# Patient Record
Sex: Male | Born: 1960
Health system: Southern US, Community
[De-identification: ages and names within clinical notes are randomized; demographics above are authoritative.]

## PROBLEM LIST (undated history)

## (undated) DIAGNOSIS — I10 Essential (primary) hypertension: Secondary | ICD-10-CM

## (undated) DIAGNOSIS — K219 Gastro-esophageal reflux disease without esophagitis: Secondary | ICD-10-CM

## (undated) DIAGNOSIS — L8990 Pressure ulcer of unspecified site, unspecified stage: Secondary | ICD-10-CM

## (undated) DIAGNOSIS — G35 Multiple sclerosis: Secondary | ICD-10-CM

## (undated) DIAGNOSIS — T7840XA Allergy, unspecified, initial encounter: Secondary | ICD-10-CM

## (undated) HISTORY — DX: Allergy, unspecified, initial encounter: T78.40XA

## (undated) HISTORY — DX: Gastro-esophageal reflux disease without esophagitis: K21.9

## (undated) HISTORY — PX: ROTATOR CUFF REPAIR: SHX139

## (undated) HISTORY — PX: OTHER SURGICAL HISTORY: SHX169

---

## 1998-11-01 ENCOUNTER — Ambulatory Visit (HOSPITAL_COMMUNITY): Admission: RE | Admit: 1998-11-01 | Discharge: 1998-11-01 | Payer: Self-pay | Admitting: Neurosurgery

## 1998-11-01 ENCOUNTER — Encounter: Payer: Self-pay | Admitting: Neurosurgery

## 2004-03-03 ENCOUNTER — Emergency Department: Payer: Self-pay | Admitting: Emergency Medicine

## 2004-03-18 ENCOUNTER — Other Ambulatory Visit: Payer: Self-pay

## 2004-03-18 ENCOUNTER — Emergency Department: Payer: Self-pay | Admitting: Emergency Medicine

## 2005-03-17 ENCOUNTER — Ambulatory Visit: Payer: Self-pay | Admitting: Family Medicine

## 2005-12-29 ENCOUNTER — Ambulatory Visit: Payer: Self-pay | Admitting: Psychiatry

## 2006-12-13 ENCOUNTER — Encounter: Payer: Self-pay | Admitting: Psychiatry

## 2006-12-25 ENCOUNTER — Encounter: Payer: Self-pay | Admitting: Psychiatry

## 2007-01-25 ENCOUNTER — Encounter: Payer: Self-pay | Admitting: Psychiatry

## 2007-06-25 ENCOUNTER — Encounter (HOSPITAL_COMMUNITY): Admission: RE | Admit: 2007-06-25 | Discharge: 2007-09-03 | Payer: Self-pay | Admitting: *Deleted

## 2007-08-29 ENCOUNTER — Ambulatory Visit: Payer: Self-pay | Admitting: Family Medicine

## 2007-08-29 DIAGNOSIS — F3289 Other specified depressive episodes: Secondary | ICD-10-CM | POA: Insufficient documentation

## 2007-08-29 DIAGNOSIS — F329 Major depressive disorder, single episode, unspecified: Secondary | ICD-10-CM

## 2007-08-29 DIAGNOSIS — G35 Multiple sclerosis: Secondary | ICD-10-CM | POA: Insufficient documentation

## 2007-08-29 DIAGNOSIS — K219 Gastro-esophageal reflux disease without esophagitis: Secondary | ICD-10-CM

## 2007-08-29 DIAGNOSIS — N529 Male erectile dysfunction, unspecified: Secondary | ICD-10-CM

## 2007-08-29 DIAGNOSIS — J309 Allergic rhinitis, unspecified: Secondary | ICD-10-CM | POA: Insufficient documentation

## 2007-08-29 DIAGNOSIS — G4700 Insomnia, unspecified: Secondary | ICD-10-CM

## 2007-12-19 ENCOUNTER — Encounter: Payer: Self-pay | Admitting: Family Medicine

## 2008-03-20 ENCOUNTER — Encounter: Payer: Self-pay | Admitting: Family Medicine

## 2008-04-29 ENCOUNTER — Ambulatory Visit: Payer: Self-pay | Admitting: Family Medicine

## 2008-04-29 DIAGNOSIS — K5900 Constipation, unspecified: Secondary | ICD-10-CM | POA: Insufficient documentation

## 2008-05-23 ENCOUNTER — Encounter (INDEPENDENT_AMBULATORY_CARE_PROVIDER_SITE_OTHER): Payer: Self-pay | Admitting: *Deleted

## 2008-06-11 ENCOUNTER — Encounter (INDEPENDENT_AMBULATORY_CARE_PROVIDER_SITE_OTHER): Payer: Self-pay | Admitting: *Deleted

## 2008-06-24 ENCOUNTER — Encounter (INDEPENDENT_AMBULATORY_CARE_PROVIDER_SITE_OTHER): Payer: Self-pay | Admitting: *Deleted

## 2008-06-27 ENCOUNTER — Ambulatory Visit: Payer: Self-pay | Admitting: Family Medicine

## 2008-07-04 LAB — CONVERTED CEMR LAB
ALT: 42 units/L (ref 0–53)
AST: 26 units/L (ref 0–37)
Albumin: 3.6 g/dL (ref 3.5–5.2)
Alkaline Phosphatase: 78 units/L (ref 39–117)
BUN: 28 mg/dL — ABNORMAL HIGH (ref 6–23)
Basophils Absolute: 0 10*3/uL (ref 0.0–0.1)
Basophils Relative: 0.1 % (ref 0.0–3.0)
Bilirubin, Direct: 0.1 mg/dL (ref 0.0–0.3)
CO2: 31 meq/L (ref 19–32)
Calcium: 9 mg/dL (ref 8.4–10.5)
Chloride: 112 meq/L (ref 96–112)
Cholesterol: 224 mg/dL — ABNORMAL HIGH (ref 0–200)
Creatinine, Ser: 0.9 mg/dL (ref 0.4–1.5)
Direct LDL: 162.4 mg/dL
Eosinophils Absolute: 0.6 10*3/uL (ref 0.0–0.7)
Eosinophils Relative: 9.4 % — ABNORMAL HIGH (ref 0.0–5.0)
Folate: 13 ng/mL
GFR calc non Af Amer: 95.65 mL/min (ref >60)
Glucose, Bld: 107 mg/dL — ABNORMAL HIGH (ref 70–99)
HCT: 44.1 % (ref 39.0–52.0)
HDL: 22 mg/dL — ABNORMAL LOW (ref >39.00)
Hemoglobin: 15.1 g/dL (ref 13.0–17.0)
Lymphocytes Relative: 17 % (ref 12.0–46.0)
Lymphs Abs: 1 10*3/uL (ref 0.7–4.0)
MCHC: 34.3 g/dL (ref 30.0–36.0)
MCV: 86.5 fL (ref 78.0–100.0)
Monocytes Absolute: 0.7 10*3/uL (ref 0.1–1.0)
Monocytes Relative: 11.3 % (ref 3.0–12.0)
Neutro Abs: 3.8 10*3/uL (ref 1.4–7.7)
Neutrophils Relative %: 62.2 % (ref 43.0–77.0)
PSA: 0.77 ng/mL (ref 0.10–4.00)
Platelets: 240 10*3/uL (ref 150.0–400.0)
Potassium: 4.1 meq/L (ref 3.5–5.1)
RBC: 5.09 M/uL (ref 4.22–5.81)
RDW: 12 % (ref 11.5–14.6)
Sodium: 144 meq/L (ref 135–145)
TSH: 3.3 microintl units/mL (ref 0.35–5.50)
Total Bilirubin: 0.8 mg/dL (ref 0.3–1.2)
Total CHOL/HDL Ratio: 10
Total Protein: 6.8 g/dL (ref 6.0–8.3)
Triglycerides: 239 mg/dL — ABNORMAL HIGH (ref 0.0–149.0)
VLDL: 47.8 mg/dL — ABNORMAL HIGH (ref 0.0–40.0)
Vitamin B-12: 411 pg/mL (ref 211–911)
WBC: 6.1 10*3/uL (ref 4.5–10.5)

## 2008-08-04 ENCOUNTER — Encounter: Payer: Self-pay | Admitting: Family Medicine

## 2008-08-06 ENCOUNTER — Encounter: Payer: Self-pay | Admitting: Family Medicine

## 2008-10-06 ENCOUNTER — Encounter (INDEPENDENT_AMBULATORY_CARE_PROVIDER_SITE_OTHER): Payer: Self-pay | Admitting: *Deleted

## 2008-11-24 DEATH — deceased

## 2009-03-27 ENCOUNTER — Telehealth: Payer: Self-pay | Admitting: Family Medicine

## 2009-04-20 ENCOUNTER — Telehealth: Payer: Self-pay | Admitting: Family Medicine

## 2009-05-09 ENCOUNTER — Ambulatory Visit: Payer: Self-pay

## 2009-05-25 ENCOUNTER — Telehealth: Payer: Self-pay | Admitting: Family Medicine

## 2009-06-18 ENCOUNTER — Telehealth: Payer: Self-pay | Admitting: Family Medicine

## 2009-07-20 ENCOUNTER — Telehealth: Payer: Self-pay | Admitting: Family Medicine

## 2009-08-20 ENCOUNTER — Telehealth: Payer: Self-pay | Admitting: Family Medicine

## 2009-09-25 ENCOUNTER — Ambulatory Visit: Payer: Self-pay | Admitting: Family Medicine

## 2009-09-25 ENCOUNTER — Telehealth: Payer: Self-pay | Admitting: Family Medicine

## 2009-09-25 ENCOUNTER — Encounter: Payer: Self-pay | Admitting: Family Medicine

## 2009-09-25 DIAGNOSIS — B351 Tinea unguium: Secondary | ICD-10-CM

## 2009-09-25 DIAGNOSIS — R609 Edema, unspecified: Secondary | ICD-10-CM | POA: Insufficient documentation

## 2009-10-06 ENCOUNTER — Encounter (INDEPENDENT_AMBULATORY_CARE_PROVIDER_SITE_OTHER): Payer: Self-pay | Admitting: *Deleted

## 2009-10-20 ENCOUNTER — Telehealth: Payer: Self-pay | Admitting: Family Medicine

## 2009-11-19 ENCOUNTER — Telehealth: Payer: Self-pay | Admitting: Family Medicine

## 2009-12-08 ENCOUNTER — Ambulatory Visit: Payer: Self-pay | Admitting: Family Medicine

## 2009-12-08 DIAGNOSIS — L89301 Pressure ulcer of unspecified buttock, stage 1: Secondary | ICD-10-CM | POA: Insufficient documentation

## 2009-12-10 ENCOUNTER — Telehealth: Payer: Self-pay | Admitting: Family Medicine

## 2009-12-16 ENCOUNTER — Telehealth (INDEPENDENT_AMBULATORY_CARE_PROVIDER_SITE_OTHER): Payer: Self-pay | Admitting: *Deleted

## 2009-12-21 ENCOUNTER — Ambulatory Visit: Payer: Self-pay | Admitting: Family Medicine

## 2009-12-25 DIAGNOSIS — R7303 Prediabetes: Secondary | ICD-10-CM

## 2009-12-25 LAB — CONVERTED CEMR LAB
ALT: 33 units/L (ref 0–53)
AST: 25 units/L (ref 0–37)
Albumin: 3.7 g/dL (ref 3.5–5.2)
Alkaline Phosphatase: 73 units/L (ref 39–117)
BUN: 28 mg/dL — ABNORMAL HIGH (ref 6–23)
Bilirubin, Direct: 0.1 mg/dL (ref 0.0–0.3)
CO2: 29 meq/L (ref 19–32)
Calcium: 9.4 mg/dL (ref 8.4–10.5)
Chloride: 103 meq/L (ref 96–112)
Cholesterol: 263 mg/dL — ABNORMAL HIGH (ref 0–200)
Creatinine, Ser: 1 mg/dL (ref 0.4–1.5)
Direct LDL: 182.2 mg/dL
GFR calc non Af Amer: 83.22 mL/min (ref >60)
Glucose, Bld: 123 mg/dL — ABNORMAL HIGH (ref 70–99)
HDL: 31.6 mg/dL — ABNORMAL LOW (ref >39.00)
Potassium: 4.8 meq/L (ref 3.5–5.1)
Sodium: 140 meq/L (ref 135–145)
TSH: 4.01 microintl units/mL (ref 0.35–5.50)
Total Bilirubin: 0.9 mg/dL (ref 0.3–1.2)
Total CHOL/HDL Ratio: 8
Total Protein: 7 g/dL (ref 6.0–8.3)
Triglycerides: 252 mg/dL — ABNORMAL HIGH (ref 0.0–149.0)
VLDL: 50.4 mg/dL — ABNORMAL HIGH (ref 0.0–40.0)

## 2009-12-31 ENCOUNTER — Encounter (INDEPENDENT_AMBULATORY_CARE_PROVIDER_SITE_OTHER): Payer: Self-pay | Admitting: *Deleted

## 2010-02-01 ENCOUNTER — Telehealth: Payer: Self-pay | Admitting: Family Medicine

## 2010-02-23 NOTE — Progress Notes (Signed)
Summary: refill  Phone Note Refill Request Message from:  Scriptline on September 25, 2009 9:30 AM  Refills Requested: Medication #1:  TEMAZEPAM 15 MG  CAPS Take 1 tab by mouth at bedtime   Supply Requested: [facility] month [facility]   Method Requested: Electronic Initial call taken by: Benny Lennert CMA Duncan Dull),  September 25, 2009 9:30 AM  Follow-up for Phone Call        Rx called to pharmacy Follow-up by: Benny Lennert CMA Duncan Dull),  September 25, 2009 2:19 PM    Prescriptions: TEMAZEPAM 15 MG  CAPS (TEMAZEPAM) Take 1 tab by mouth at bedtime  #30 x 0   Entered by:   Benny Lennert CMA (AAMA)   Authorized by:   Kerby Nora MD   Signed by:   Benny Lennert CMA (AAMA) on 09/25/2009   Method used:   Telephoned to ...       [facility])       [address], [address]       [place], [place]  [address]       Ph: [phone]       Fax: [phone]   RxID:   [id number] TEMAZEPAM 15 MG  CAPS (TEMAZEPAM) Take 1 tab by mouth at bedtime  #30 x 0   Entered and Authorized by:   Kerby Nora MD   Signed by:   Kerby Nora MD on 09/25/2009   Method used:   Telephoned to ...       [facility]ail)       [address]       [place], [place]  [address]       Ph: [phone]       Fax: [phone]   RxID:   [id number]

## 2010-02-23 NOTE — Letter (Signed)
Summary: [place] No Show Letter  [place] at [facility]  [address] [place], [place] [address]   Phone: [phone]  Fax: [phone]    10/06/2009 MRN: [id number]  Travis Cantrell [address] [place], [place]  [address]   Dear Mr. Frosch,   Our records indicate that you missed your scheduled appointment with _____Lab________________ on ___9-12-11_________.  Please contact this office to reschedule your appointment as soon as possible.  It is important that you keep your scheduled appointments with your physician, so we can provide you the best care possible.  Please be advised that there may be a charge for "no show" appointments.    Sincerely,   [place] at [facility]

## 2010-02-23 NOTE — Assessment & Plan Note (Signed)
Vital Signs:  Patient profile:   50 year old male Height:      70 inches Weight:      215 pounds BMI:     30.96 Temp:     98.1 degrees F oral Pulse rate:   80 / minute Pulse rhythm:   regular BP sitting:   120 / 70  (left arm) Cuff size:   regular  Vitals Entered By: Benny Lennert CMA Duncan Dull) (September 25, 2009 2:40 PM)  History of Present Illness: Chief complaint feet swelling  50 yo wheelchair bound male with MS...comes to clinic with B peripheral swelling in feet, ankles.  He has some baselline peripheral swelling..but this was worse, resolved now. USing salt in diet. Has been elevating feet.   Has toenail funus, brother with gout. On new med is provigil for sedation. BP well control.   Problems Prior to Update: 1)  Physical Examination  (ICD-V70.0) 2)  Decubitus Ulcer, Buttock  (ICD-707.05) 3)  Special Screening Malignant Neoplasm of Prostate  (ICD-V76.44) 4)  Hematochezia  (ICD-578.1) 5)  Neoplasm, Malignant, Colon, Family Hx, Sibling  (ICD-V16.0) 6)  Screening For Lipoid Disorders  (ICD-V77.91) 7)  Constipation  (ICD-564.00) 8)  Gerd  (ICD-530.81) 9)  Allergic Rhinitis  (ICD-477.9) 10)  Insomnia, Chronic  (ICD-307.42) 11)  Depression  (ICD-311) 12)  Organic Impotence  (ICD-607.84) 13)  Multiple Sclerosis  (ICD-340)  Current Medications (verified): 1)  Viagra 100 Mg  Tabs (Sildenafil Citrate) .... Take 1 Tablet 2 Hours Prior To Intercourse. 2)  Temazepam 15 Mg  Caps (Temazepam) .... Take 1 Tab By Mouth At Bedtime 3)  Fluoxetine Hcl 40 Mg  Caps (Fluoxetine Hcl) .... Take 1 Tablet By Mouth Once A Day 4)  Ibuprofen 800 Mg  Tabs (Ibuprofen) .... Take 1 Tablet By Mouth Three Times A Day With Food. 5)  Gabapentin 300 Mg  Caps (Gabapentin) .... 3 Tablets Three Times A Day By Mouth 6)  Excedrin Migraine 250-250-65 Mg  Tabs (Aspirin-Acetaminophen-Caffeine) .... As Needed 7)  Betaseron 0.3 Mg  Solr (Interferon Beta-1b) .... Take 1 Shot Every Other Night At  Bedtime 8)  Multivitamins   Tabs (Multiple Vitamin) .... Take 1 Tablet By Mouth Once A Day 9)  Omeprazole 20 Mg Cpdr (Omeprazole) .... Take 1 Tablet By Mouth Once A Day 10)  Hydrocodone-Acetaminophen 5-325 Mg Tabs (Hydrocodone-Acetaminophen) .... Take 1 Tablets By Mouth As Needed As Needed Headache 11)  Simvastatin 40 Mg Tabs (Simvastatin) .... Take 1 Tablet By Mouth Once A Day 12)  Power Administrator, arts .... Dx 340 13)  Provigil 100 Mg Tabs (Modafinil) .... One Tablet Dialy  Allergies (verified): No Known Drug Allergies  Past History:  Past medical, surgical, family and social histories (including risk factors) reviewed, and no changes noted (except as noted below).  Past Medical History: Reviewed history from 08/29/2007 and no changes required. Allergic rhinitis GERD  Past Surgical History: Reviewed history from 08/29/2007 and no changes required. left rotator cuff surgery left leg vein abnormality repaired 1990s  Family History: Reviewed history from 06/27/2008 and no changes required. father: DM, ? MI  mother: multiple myeloma 12 brothers and sisters brother: DM, HTN, ? prostate or colon cancer age 32  Social History: Reviewed history from 08/29/2007 and no changes required. Occupation: disabled due to MS Married 1 daughter biologic, 1 adopted Never Smoked Alcohol use-yes, 2 beers every few weeks Drug use-no Regular exercise-trys to walk as tolerated Diet: poor, limited f and v, lots  of water  Review of Systems       Has central neck pain.  General:  Denies fatigue and fever. CV:  Denies chest pain or discomfort. Resp:  Denies shortness of breath. GI:  Denies abdominal pain. GU:  Denies dysuria.  Physical Exam  General:  Overweight male in NAD in wheelchair. Mouth:  MMM Neck:  no carotid bruit or thyromegaly no cervical or supraclavicular lymphadenopathy  Lungs:  Normal respiratory effort, chest expands symmetrically. Lungs are  clear to auscultation, no crackles or wheezes. Heart:  Normal rate and regular rhythm. S1 and S2 normal without gallop, murmur, click, rub or other extra sounds. Abdomen:  Bowel sounds positive,abdomen soft and non-tender without masses, organomegaly or hernias noted. Extremities:  1+ left pedal edema and 1+ right pedal edema.   B feet bluish purple, cold toes, pulse 2 plus B  Skin:  toe nails B with fungal changes, right great toe off   Impression & Recommendations:  Problem # 1:  PERIPHERAL EDEMA (ICD-782.3) Liekly due to venpus insufficiency from immobillity. Will eval for liver kidney and thyroid issues. Start compression hose, elevation and move as able.  MAy consider lasix in future, but likely will not work in this case.   Problem # 2:  ONYCHOMYCOSIS, BILATERAL (ICD-110.1) Offered lamasil, but risk of liver issues.Pt choose to try topical penlac daily.   Problem # 3:  Preventive Health Care (ICD-V70.0) Assessment: Comment Only Overdue for CPX.   Complete Medication List: 1)  Viagra 100 Mg Tabs (Sildenafil citrate) .... Take 1 tablet 2 hours prior to intercourse. 2)  Temazepam 15 Mg Caps (Temazepam) .... Take 1 tab by mouth at bedtime 3)  Fluoxetine Hcl 40 Mg Caps (Fluoxetine hcl) .... Take 1 tablet by mouth once a day 4)  Ibuprofen 800 Mg Tabs (Ibuprofen) .... Take 1 tablet by mouth three times a day with food. 5)  Gabapentin 300 Mg Caps (Gabapentin) .... 3 tablets three times a day by mouth 6)  Excedrin Migraine 250-250-65 Mg Tabs (Aspirin-acetaminophen-caffeine) .... As needed 7)  Betaseron 0.3 Mg Solr (Interferon beta-1b) .... Take 1 shot every other night at bedtime 8)  Multivitamins Tabs (Multiple vitamin) .... Take 1 tablet by mouth once a day 9)  Omeprazole 20 Mg Cpdr (Omeprazole) .... Take 1 tablet by mouth once a day 10)  Hydrocodone-acetaminophen 5-325 Mg Tabs (Hydrocodone-acetaminophen) .... Take 1 tablets by mouth as needed as needed headache 11)  Simvastatin 40 Mg  Tabs (Simvastatin) .... Take 1 tablet by mouth once a day 12)  Power Administrator, arts  .... Dx 340 13)  Provigil 100 Mg Tabs (Modafinil) .... One tablet dialy  Patient Instructions: 1)  Wear compression hose daily. 2)  Elevate feet above heart. 3)  Return for fasting labs .Marland KitchenMarland KitchenCMET, LIPIDs, TSH Dx 782.3 now. 4)  Scheduled CPX following months. 5)  May use penlac for toenail fungus.  Current Allergies (reviewed today): No known allergies

## 2010-02-23 NOTE — Progress Notes (Signed)
  Phone Note Refill Request Message from:  Patient on August 20, 2009 11:06 AM  Refills Requested: Medication #1:  TEMAZEPAM 15 MG  CAPS Take 1 tab by mouth at bedtime   Supply Requested: [facility] month [facility]   Method Requested: Electronic Initial call taken by: Benny Lennert CMA Duncan Dull),  August 20, 2009 11:06 AM  Follow-up for Phone Call        thursday - will forward to AEB Follow-up by: Hannah Beat MD,  August 20, 2009 11:28 AM  Additional Follow-up for Phone Call Additional follow up Details #1::        Rx called to pharmacy Additional Follow-up by: Benny Lennert CMA Duncan Dull),  August 21, 2009 2:33 PM    Prescriptions: TEMAZEPAM 15 MG  CAPS (TEMAZEPAM) Take 1 tab by mouth at bedtime  #30 x 0   Entered and Authorized by:   Kerby Nora MD   Signed by:   Kerby Nora MD on 08/21/2009   Method used:   Telephoned to ...       [facility]       [address]       [place], [place]  [address]       Ph: [phone]       Fax: [phone]   RxID:   [id number]

## 2010-02-23 NOTE — Progress Notes (Signed)
Summary: Rx Temazepam  Phone Note Refill Request Call back at [phone] Message from:  [facility] on November 19, 2009 11:32 AM  Refills Requested: Medication #1:  TEMAZEPAM 15 MG  CAPS Take 1 tab by mouth at bedtime   Last Refilled: 10/22/2009 Received E-script request please advise.   Method Requested: Telephone to Pharmacy Initial call taken by: Linde Gillis CMA Duncan Dull),  November 19, 2009 11:33 AM  Follow-up for Phone Call        Rx called to pharmacy Follow-up by: Benny Lennert CMA Duncan Dull),  November 20, 2009 10:47 AM    Prescriptions: TEMAZEPAM 15 MG  CAPS (TEMAZEPAM) Take 1 tab by mouth at bedtime  #30 x 0   Entered and Authorized by:   Kerby Nora MD   Signed by:   Kerby Nora MD on 11/20/2009   Method used:   Telephoned to ...       [facility] [address]       [address]       [place], [place]  [address]       Ph: [phone]       Fax: [phone]   RxID:   [id number]

## 2010-02-23 NOTE — Progress Notes (Signed)
Summary: refill  Phone Note Refill Request Message from:  Scriptline on Jun 18, 2009 12:09 PM  Refills Requested: Medication #1:  TEMAZEPAM 15 MG  CAPS Take 1 tab by mouth at bedtime   Supply Requested: [facility] month [phone]   Method Requested: Electronic Initial call taken by: Benny Lennert CMA Duncan Dull),  Jun 18, 2009 12:09 PM  Follow-up for Phone Call        rx called to pharamcy.Consuello Masse CMA  Follow-up by: Benny Lennert CMA Duncan Dull),  Jun 18, 2009 1:04 PM    Prescriptions: TEMAZEPAM 15 MG  CAPS (TEMAZEPAM) Take 1 tab by mouth at bedtime  #30 x 0   Entered and Authorized by:   Kerby Nora MD   Signed by:   Kerby Nora MD on 06/18/2009   Method used:   Telephoned to ...       [facility] [address]       [address]       [place], [place]  [address]       Ph: [phone]       Fax: [phone]   RxID:   [id number]

## 2010-02-23 NOTE — Letter (Signed)
Summary: Generic Letter  [place] at [facility]  [address] [place], [place] [address]   Phone: [phone]  Fax: [phone]    12/31/2009  Travis Cantrell [address] [place], [place]  [address]  Dear Mr. Dingus,  Very poor control cholesterol and borderline diabtes... both much worse than last year.  Mail info on low carb diet  Nml liver, kidney and thyroid.  Exercise as able..? water exercise easier for him? Or atleast some upper body exercise.  Add fish oil to diet 2000 mg divided daily..    Also stop simvastain and change to crestor.  Recheck fasting LIPIDS, AST, ALT  in 3 months Dx 272.0     I have tried to reach you many times and have been unable to contact you please contact our office with any questions.      Sincerely,   Benny Lennert CMA (AAMA)

## 2010-02-23 NOTE — Progress Notes (Signed)
  Phone Note Refill Request Message from:  Scriptline on October 20, 2009 12:07 PM  Refills Requested: Medication #1:  TEMAZEPAM 15 MG  CAPS Take 1 tab by mouth at bedtime   Supply Requested: [facility] month [facility]   Method Requested: Electronic Initial call taken by: Benny Lennert CMA Duncan Dull),  October 20, 2009 12:07 PM  Follow-up for Phone Call        Rx called to pharmacy Follow-up by: Benny Lennert CMA Duncan Dull),  October 20, 2009 3:54 PM    Prescriptions: TEMAZEPAM 15 MG  CAPS (TEMAZEPAM) Take 1 tab by mouth at bedtime  #30 x 0   Entered and Authorized by:   Kerby Nora MD   Signed by:   Kerby Nora MD on 10/20/2009   Method used:   Telephoned to ...       [facility] [address]       [address], [address]       [place], [place]  [address]       Ph: [phone]       Fax: [phone]   RxID:   [id number]

## 2010-02-23 NOTE — Progress Notes (Signed)
Summary: refill  Phone Note Refill Request Message from:  Patient on May 25, 2009 12:00 PM  Refills Requested: Medication #1:  TEMAZEPAM 15 MG  CAPS Take 1 tab by mouth at bedtime   Supply [facility]ed: [facility] [address]   Method Requested: Electronic Initial call taken by: Benny Lennert CMA Duncan Dull),  May 25, 2009 12:01 PM  Follow-up for Phone Call        Rx called to pharmacy Follow-up by: Benny Lennert CMA Duncan Dull),  May 25, 2009 12:44 PM    Prescriptions: TEMAZEPAM 15 MG  CAPS (TEMAZEPAM) Take 1 tab by mouth at bedtime  #30 x 0   Entered and Authorized by:   Kerby Nora MD   Signed by:   Kerby Nora MD on 05/25/2009   Method used:   Telephoned to ...       [facility] [address]       [address], [address]       [place], [place]  [address]       Ph: [phone]       Fax: [phone]   RxID:   [id number]

## 2010-02-23 NOTE — Assessment & Plan Note (Signed)
Summary: CPX/DLO   Vital Signs:  Patient profile:   50 year old male Height:      70 inches Weight:      215.0 pounds BMI:     30.96 Temp:     98.1 degrees F oral Pulse rate:   80 / minute Pulse rhythm:   regular BP sitting:   120 / 70  (left arm) Cuff size:   regular  Vitals Entered By: Benny Lennert CMA Duncan Dull) (December 08, 2009 2:46 PM)  History of Present Illness: Chief complaint cpx  50 yo wheelchair bound male with MS. The patient is here for annual wellness exam and preventative care.   As well as to discuss the following chronic and acite health issues:   No exercise. Never returned for labs.  Peripheral swelling improved with elevation of feet.  Ear ringing left.. simple irrigation,  cerumen impaction removed.   Under right buttock cheek..redness from wearing diaper. Stays wet, sweaty.  No family history of early cancer.   Depresson:  stable on current dose of fluoxetine  Problems Prior to Update: 1)  Decubitus Ulcer, Buttock  (ICD-707.05) 2)  Onychomycosis, Bilateral  (ICD-110.1) 3)  Peripheral Edema  (ICD-782.3) 4)  Physical Examination  (ICD-V70.0) 5)  Neoplasm, Malignant, Colon, Family Hx, Sibling  (ICD-V16.0) 6)  Screening For Lipoid Disorders  (ICD-V77.91) 7)  Constipation  (ICD-564.00) 8)  Gerd  (ICD-530.81) 9)  Allergic Rhinitis  (ICD-477.9) 10)  Insomnia, Chronic  (ICD-307.42) 11)  Depression  (ICD-311) 12)  Organic Impotence  (ICD-607.84) 13)  Multiple Sclerosis  (ICD-340)  Current Medications (verified): 1)  Temazepam 15 Mg  Caps (Temazepam) .... Take 1 Tab By Mouth At Bedtime 2)  Fluoxetine Hcl 20 Mg Caps (Fluoxetine Hcl) .... 3 Tab By Mouth Daily 3)  Ibuprofen 800 Mg  Tabs (Ibuprofen) .... Take 1 Tablet By Mouth Three Times A Day With Food. 4)  Gabapentin 300 Mg  Caps (Gabapentin) .... 3 Tablets Three Times A Day By Mouth 5)  Excedrin Migraine 250-250-65 Mg  Tabs (Aspirin-Acetaminophen-Caffeine) .... As Needed 6)  Multivitamins   Tabs  (Multiple Vitamin) .... Take 1 Tablet By Mouth Once A Day 7)  Omeprazole 20 Mg Cpdr (Omeprazole) .... Take 1 Tablet By Mouth Once A Day 8)  Simvastatin 40 Mg Tabs (Simvastatin) .... Take 1 Tablet By Mouth Once A Day 9)  Power Administrator, arts .... Dx 340 10)  Provigil 100 Mg Tabs (Modafinil) .... One Tablet Dialy 11)  Cialis 20 Mg Tabs (Tadalafil) .... 1/4 To 1 Tab By Mouth As Needed Sexual Activity 12)  Tegaderm Hydrocolloid Thin  Misc (Hydroactive Dressings) .... or Generic Eqivalent For Stage 1 Decubitus Ulcer Right Buttock.Antony Blackbird Weekly  Allergies (verified): No Known Drug Allergies  Past History:  Past medical, surgical, family and social histories (including risk factors) reviewed, and no changes noted (except as noted below).  Past Medical History: Reviewed history from 08/29/2007 and no changes required. Allergic rhinitis GERD  Past Surgical History: Reviewed history from 08/29/2007 and no changes required. left rotator cuff surgery left leg vein abnormality repaired 1990s  Family History: Reviewed history from 06/27/2008 and no changes required. father: DM, ? MI  mother: multiple myeloma 12 brothers and sisters brother: DM, HTN, ? prostate or colon cancer age 36  Social History: Reviewed history from 08/29/2007 and no changes required. Occupation: disabled due to MS Married 1 daughter biologic, 1 adopted Never Smoked Alcohol use-yes, 2 beers every few weeks Drug use-no Regular exercise-trys  to walk as tolerated Diet: poor, limited f and v, lots of water  Review of Systems General:  Complains of fatigue; denies fever. CV:  Denies chest pain or discomfort. Resp:  Denies shortness of breath. GI:  Denies abdominal pain. GU:  Denies dysuria.  Physical Exam  General:  Overweight male in NAD in wheelchair, somewhat unkempt Mouth:  MMM Neck:  no carotid bruit or thyromegaly no cervical or supraclavicular lymphadenopathy  Lungs:  Normal  respiratory effort, chest expands symmetrically. Lungs are clear to auscultation, no crackles or wheezes. Heart:  Normal rate and regular rhythm. S1 and S2 normal without gallop, murmur, click, rub or other extra sounds. Abdomen:  Bowel sounds positive,abdomen soft and non-tender without masses, organomegaly or hernias noted. Genitalia:  Testes bilaterally descended without nodularity, tenderness or masses. No scrotal masses or lesions. No penis lesions or urethral discharge. Pulses:  R and L posterior tibial pulses are full and equal bilaterally  Extremities:  B 1 plus swelling in ankles Skin:  grade 1 decubitus ulcer on right buttock Psych:  Cognition and judgment appear intact. Alert and cooperative with normal attention span and concentration. No apparent delusions, illusions, hallucinations   Impression & Recommendations:  Problem # 1:  PHYSICAL EXAMINATION (ICD-V70.0) The patient's preventative maintenance and recommended screening tests for an annual wellness exam were reviewed in full today. Brought up to date unless services declined.  Counselled on the importance of diet, exercise, and its role in overall health and mortality. The patient's FH and SH was reviewed, including their home life, tobacco status, and drug and alcohol status.     Problem # 2:  PERIPHERAL EDEMA (ICD-782.3) Improved with elevation of ankles.. Offered compression hose.   Problem # 3:  DECUBITUS ULCER, BUTTOCK (ICD-707.05) Treta with tegaderm topical skin barrier weekly. Clean with warm soapy water.   Problem # 4:  DEPRESSION (ICD-311) Stable on current meds.  His updated medication list for this problem includes:    Fluoxetine Hcl 20 Mg Caps (Fluoxetine hcl) .Marland KitchenMarland KitchenMarland KitchenMarland Kitchen 3 tab by mouth daily  Problem # 5:  MULTIPLE SCLEROSIS (ICD-340) Per Neuro..continues to be progressive.   Problem # 6:  ORGANIC IMPOTENCE (ICD-607.84) Trial of cialis.  The following medications were removed from the medication list:     Viagra 100 Mg Tabs (Sildenafil citrate) .Marland Kitchen... Take 1 tablet 2 hours prior to intercourse. His updated medication list for this problem includes:    Cialis 20 Mg Tabs (Tadalafil) .Marland Kitchen... 1/4 to 1 tab by mouth as needed sexual activity  Complete Medication List: 1)  Temazepam 15 Mg Caps (Temazepam) .... Take 1 tab by mouth at bedtime 2)  Fluoxetine Hcl 20 Mg Caps (Fluoxetine hcl) .... 3 tab by mouth daily 3)  Ibuprofen 800 Mg Tabs (Ibuprofen) .... Take 1 tablet by mouth three times a day with food. 4)  Gabapentin 300 Mg Caps (Gabapentin) .... 3 tablets three times a day by mouth 5)  Excedrin Migraine 250-250-65 Mg Tabs (Aspirin-acetaminophen-caffeine) .... As needed 6)  Multivitamins Tabs (Multiple vitamin) .... Take 1 tablet by mouth once a day 7)  Omeprazole 20 Mg Cpdr (Omeprazole) .... Take 1 tablet by mouth once a day 8)  Simvastatin 40 Mg Tabs (Simvastatin) .... Take 1 tablet by mouth once a day 9)  Power Administrator, arts  .... Dx 340 10)  Provigil 100 Mg Tabs (Modafinil) .... One tablet dialy 11)  Cialis 20 Mg Tabs (Tadalafil) .... 1/4 to 1 tab by mouth as needed sexual  activity 12)  Tegaderm Hydrocolloid Thin Misc (Hydroactive dressings) .... Or generic eqivalent for stage 1 decubitus ulcer right buttock.Antony Blackbird weekly  Patient Instructions: 1)  Return for fasting labs .Marland KitchenMarland KitchenCMET, LIPIDs, TSH Dx 782.3 now. 2)  Please schedule a follow-up appointment in 3 months 30 min  multiple medical issues.  3)   Apply tegaderm to decubitus ulcer weekly.  Prescriptions: TEGADERM HYDROCOLLOID THIN  MISC (HYDROACTIVE DRESSINGS) or generic eqivalent for stage 1 decubitus ulcer right buttock.Antony Blackbird weekly  #1 box x 0   Entered and Authorized by:   Kerby Nora MD   Signed by:   Kerby Nora MD on 12/08/2009   Method used:   Electronically to        [facility] [address]       [address]       [place], [place]  [address]       Ph:  [phone]       Fax: [phone]   RxID:   [id number] CIALIS 20 MG TABS (TADALAFIL) 1/4 to 1 tab by mouth as needed sexual activity  #9 x 1   Entered and Authorized by:   Kerby Nora MD   Signed by:   Kerby Nora MD on 12/08/2009   Method used:   Electronically to        [facility] [address]       [address], [address]       [place], [place]  [address]       Ph: [phone]       Fax: [phone]   RxID:   [id number]    Orders Added: 1)  Est. Patient 40-64 years [[address]]    Current Allergies (reviewed today): No known allergies    Flu Vaccine Next Due:  Refused      Past Medical History:    Reviewed history from 08/29/2007 and no changes required:       Allergic rhinitis       GERD  Past Surgical History:    Reviewed history from 08/29/2007 and no changes required:       left rotator cuff surgery       left leg vein abnormality repaired 1990s

## 2010-02-23 NOTE — Progress Notes (Signed)
Summary: regarding cialis, levitra  Phone Note Call from Patient   [name]: Patient Summary of Call: Pt has script for cialis but this is too expensive.  He is asking if he can try levitra.  [facility] and see if they have any good prices on these meds and he will call back.               Lowella Petties CMA, AAMA  December 16, 2009 5:02 PM

## 2010-02-23 NOTE — Progress Notes (Signed)
Summary: refill request for temazepam  Phone Note Refill Request Message from:  Patient  Refills Requested: Medication #1:  TEMAZEPAM 15 MG  CAPS Take 1 tab by mouth at bedtime Phoned request from pt, please send to [facility].  Initial call taken by: Lowella Petties CMA,  March 27, 2009 3:39 PM  Follow-up for Phone Call        Rx called to pharmacy Follow-up by: Benny Lennert CMA Duncan Dull),  March 27, 2009 3:58 PM  Additional Follow-up for Phone Call Additional follow up Details #1::       Additional Follow-up by: Benny Lennert CMA Duncan Dull),  March 27, 2009 3:58 PM    Prescriptions: TEMAZEPAM 15 MG  CAPS (TEMAZEPAM) Take 1 tab by mouth at bedtime  #30 x 0   Entered and Authorized by:   Kerby Nora MD   Signed by:   Kerby Nora MD on 03/27/2009   Method used:   Telephoned to ...       [facility] [address] (retail)       [address]       [place], [place]  [address]       Ph: [phone]       Fax: [phone]   RxID:   [id number]

## 2010-02-23 NOTE — Progress Notes (Signed)
Summary: temazapam  Phone Note Refill Request Message from:  Scriptline on July 20, 2009 7:33 AM  Refills Requested: Medication #1:  TEMAZEPAM 15 MG  CAPS Take 1 tab by mouth at bedtime   Supply Requested: 1 month [facility] [address] [phone]   Method Requested: Electronic Initial call taken by: Benny Lennert CMA Duncan Dull),  July 20, 2009 7:33 AM  Follow-up for Phone Call        Rx called to pharmacy Follow-up by: Benny Lennert CMA Duncan Dull),  July 21, 2009 3:22 PM    Prescriptions: TEMAZEPAM 15 MG  CAPS (TEMAZEPAM) Take 1 tab by mouth at bedtime  #30 x 0   Entered and Authorized by:   Kerby Nora MD   Signed by:   Kerby Nora MD on 07/21/2009   Method used:   Telephoned to ...       [facility] ([facility]ail)       [address]       [place], [place]  [address]       Ph: [phone]       Fax: [phone]   RxID:   [id number]

## 2010-02-23 NOTE — Progress Notes (Signed)
  Phone Note Refill Request Message from:  Scriptline on April 20, 2009 2:02 PM  Refills Requested: Medication #1:  TEMAZEPAM 15 MG  CAPS Take 1 tab by mouth at bedtime   Supply Requested: 1 month [facility] [address]   Method Requested: Electronic Initial call taken by: Benny Lennert CMA Duncan Dull),  April 20, 2009 2:03 PM  Follow-up for Phone Call        Rx called to pharmacy Follow-up by: Benny Lennert CMA Duncan Dull),  April 21, 2009 9:22 AM    Prescriptions: TEMAZEPAM 15 MG  CAPS (TEMAZEPAM) Take 1 tab by mouth at bedtime  #30 x 0   Entered and Authorized by:   Kerby Nora MD   Signed by:   Kerby Nora MD on 04/21/2009   Method used:   Telephoned to ...       [facility]       [address], [address]       [place], [place]  [address]       Ph: [phone]       Fax: [phone]   RxID:   [id number]

## 2010-02-23 NOTE — Progress Notes (Signed)
Summary: refill request for provigil  Phone Note Refill Request Message from:  Patient  Refills Requested: Medication #1:  PROVIGIL 100 MG TABS one tablet dialy Please send to [facility].  Initial call taken by: Lowella Petties CMA, AAMA,  December 10, 2009 4:22 PM  Follow-up for Phone Call        Called to [facility] [address]. Follow-up by: Lowella Petties CMA, AAMA,  December 10, 2009 4:39 PM    Prescriptions: PROVIGIL 100 MG TABS (MODAFINIL) one tablet dialy  #30 x 0   Entered and Authorized by:   Ruthe Mannan MD   Signed by:   Ruthe Mannan MD on 12/10/2009   Method used:   Telephoned to ...       [facility] [address]       [address]       , [place]  [address]       Ph: [phone]       Fax: [phone]   RxID:   [id number]

## 2010-02-25 NOTE — Progress Notes (Signed)
Summary: provigil   Phone Note Refill Request Message from:  Patient on February 01, 2010 2:28 PM  Refills Requested: Medication #1:  PROVIGIL 100 MG TABS one tablet dialy Patient request refill be sent to [facility] [address].   Initial call taken by: Melody Comas,  February 01, 2010 2:28 PM  Follow-up for Phone Call        Rx called to pharmacy Follow-up by: Benny Lennert CMA Duncan Dull),  February 02, 2010 7:58 AM    Prescriptions: PROVIGIL 100 MG TABS (MODAFINIL) one tablet dialy  #30 x 0   Entered and Authorized by:   Kerby Nora MD   Signed by:   Kerby Nora MD on 02/01/2010   Method used:   Telephoned to ...       [facility]       [address]       [place], [place]  [address]       Ph: [phone]       Fax: [phone]   RxID:   [id number]

## 2010-04-09 ENCOUNTER — Telehealth: Payer: Self-pay | Admitting: Family Medicine

## 2010-04-13 NOTE — Progress Notes (Signed)
Summary: provigil  Phone Note Refill Request Message from:  Scriptline on April 09, 2010 12:53 PM  Refills Requested: Medication #1:  PROVIGIL 100 MG TABS one tablet dialy   Supply Requested: 1 month [facility]   Method Requested: Telephone to Pharmacy Initial call taken by: Benny Lennert CMA Duncan Dull),  April 09, 2010 12:53 PM  Follow-up for Phone Call        Rx called to pharmacy Follow-up by: Benny Lennert CMA Duncan Dull),  April 09, 2010 5:09 PM    Prescriptions: PROVIGIL 100 MG TABS (MODAFINIL) one tablet dialy  #30 x 0   Entered and Authorized by:   Kerby Nora MD   Signed by:   Kerby Nora MD on 04/09/2010   Method used:   Telephoned to ...       [facility] [address]       [address]       [place], [place]  [address]       Ph: [phone]       Fax: [phone]   RxID:   [id number]

## 2010-04-19 ENCOUNTER — Emergency Department (HOSPITAL_COMMUNITY)
Admission: EM | Admit: 2010-04-19 | Discharge: 2010-04-19 | Disposition: A | Discharge status: Home / Self Care | Payer: Medicare Other | Attending: Emergency Medicine | Admitting: Emergency Medicine

## 2010-04-19 DIAGNOSIS — R109 Unspecified abdominal pain: Secondary | ICD-10-CM | POA: Insufficient documentation

## 2010-04-19 DIAGNOSIS — R339 Retention of urine, unspecified: Secondary | ICD-10-CM | POA: Insufficient documentation

## 2010-04-19 DIAGNOSIS — G589 Mononeuropathy, unspecified: Secondary | ICD-10-CM | POA: Insufficient documentation

## 2010-04-19 DIAGNOSIS — F3289 Other specified depressive episodes: Secondary | ICD-10-CM | POA: Insufficient documentation

## 2010-04-19 DIAGNOSIS — K219 Gastro-esophageal reflux disease without esophagitis: Secondary | ICD-10-CM | POA: Insufficient documentation

## 2010-04-19 DIAGNOSIS — E7800 Pure hypercholesterolemia, unspecified: Secondary | ICD-10-CM | POA: Insufficient documentation

## 2010-04-19 DIAGNOSIS — G35 Multiple sclerosis: Secondary | ICD-10-CM | POA: Insufficient documentation

## 2010-04-19 DIAGNOSIS — F329 Major depressive disorder, single episode, unspecified: Secondary | ICD-10-CM | POA: Insufficient documentation

## 2010-04-19 LAB — URINALYSIS, ROUTINE W REFLEX MICROSCOPIC
Bilirubin Urine: NEGATIVE
Glucose, UA: NEGATIVE mg/dL
Ketones, ur: NEGATIVE mg/dL
Leukocytes, UA: NEGATIVE
Nitrite: NEGATIVE
Protein, ur: NEGATIVE mg/dL
Specific Gravity, Urine: 1.019 (ref 1.005–1.030)
Urobilinogen, UA: 0.2 mg/dL (ref 0.0–1.0)
pH: 5.5 (ref 5.0–8.0)

## 2010-04-19 LAB — URINE MICROSCOPIC-ADD ON

## 2010-04-20 LAB — URINE CULTURE
Colony Count: NO GROWTH
Culture  Setup Time: 201203260859
Culture: NO GROWTH

## 2010-04-28 ENCOUNTER — Other Ambulatory Visit: Payer: Self-pay | Admitting: *Deleted

## 2010-04-28 MED ORDER — FLUOXETINE HCL 20 MG PO CAPS: ORAL_CAPSULE | ORAL | Status: DC

## 2010-04-28 MED ORDER — OMEPRAZOLE 20 MG PO CPDR: 20.0000 mg | DELAYED_RELEASE_CAPSULE | Freq: Every day | ORAL | Status: DC

## 2010-05-19 ENCOUNTER — Other Ambulatory Visit: Payer: Self-pay | Admitting: Family Medicine

## 2010-05-24 ENCOUNTER — Encounter: Payer: Self-pay | Admitting: *Deleted

## 2010-05-25 ENCOUNTER — Encounter: Payer: Self-pay | Admitting: Family Medicine

## 2010-05-25 ENCOUNTER — Ambulatory Visit (INDEPENDENT_AMBULATORY_CARE_PROVIDER_SITE_OTHER): Payer: Medicare Other | Admitting: Family Medicine

## 2010-05-25 DIAGNOSIS — L0291 Cutaneous abscess, unspecified: Secondary | ICD-10-CM

## 2010-05-25 DIAGNOSIS — T24339A Burn of third degree of unspecified lower leg, initial encounter: Secondary | ICD-10-CM | POA: Insufficient documentation

## 2010-05-25 DIAGNOSIS — F3289 Other specified depressive episodes: Secondary | ICD-10-CM

## 2010-05-25 DIAGNOSIS — L0390 Cellulitis, unspecified: Secondary | ICD-10-CM | POA: Insufficient documentation

## 2010-05-25 DIAGNOSIS — E7800 Pure hypercholesterolemia, unspecified: Secondary | ICD-10-CM

## 2010-05-25 DIAGNOSIS — M79601 Pain in right arm: Secondary | ICD-10-CM | POA: Insufficient documentation

## 2010-05-25 DIAGNOSIS — K5900 Constipation, unspecified: Secondary | ICD-10-CM

## 2010-05-25 DIAGNOSIS — F329 Major depressive disorder, single episode, unspecified: Secondary | ICD-10-CM

## 2010-05-25 DIAGNOSIS — M79609 Pain in unspecified limb: Secondary | ICD-10-CM

## 2010-05-25 HISTORY — DX: Burn of third degree of unspecified lower leg, initial encounter: T24.339A

## 2010-05-25 MED ORDER — SULFAMETHOXAZOLE-TMP DS 800-160 MG PO TABS: 2.0000 | ORAL_TABLET | Freq: Two times a day (BID) | ORAL | Status: AC

## 2010-05-25 MED ORDER — DIAZEPAM 5 MG PO TABS: ORAL_TABLET | ORAL | Status: DC

## 2010-05-25 MED ORDER — MODAFINIL 100 MG PO TABS: 100.0000 mg | ORAL_TABLET | Freq: Every day | ORAL | Status: DC

## 2010-05-25 MED ORDER — DULOXETINE HCL 60 MG PO CPEP: 60.0000 mg | ORAL_CAPSULE | Freq: Every day | ORAL | Status: DC

## 2010-05-25 NOTE — Patient Instructions (Addendum)
Try probiotic such as Align (lactobaccili for gas and bloating. Start antibiotic for leg infection. Call if redness spreading.

## 2010-05-25 NOTE — Assessment & Plan Note (Signed)
Moderate control on Cymbalta and fluoxetine. If continuing to improve in future may consider D/C of one of these meds.
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
## 2010-05-25 NOTE — Assessment & Plan Note (Signed)
No erythema or warmth but thick yellow discharge from behind scab on left anterior lower leg. Will send for culture and start antibiotics to cover for MRSA given recent hospitalization. He will follow up with [facility] plastic surgery tommorow for recheck.
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
## 2010-05-25 NOTE — Assessment & Plan Note (Signed)
Followed by [facility]. HAs appt tommorow.

## 2010-05-25 NOTE — Assessment & Plan Note (Addendum)
No clear shoulder pathology. Pain is centered around tightness in triceps, unable to straighten arm.  Will start diazepam for contractures and muscle relaxant.

## 2010-05-25 NOTE — Assessment & Plan Note (Signed)
Due for re-eval on simvastatin. 

## 2010-05-28 LAB — WOUND CULTURE: Gram Stain: NONE SEEN

## 2010-05-31 ENCOUNTER — Other Ambulatory Visit: Payer: Medicare Other

## 2010-06-01 ENCOUNTER — Other Ambulatory Visit: Payer: Medicare Other

## 2010-06-06 ENCOUNTER — Encounter: Payer: Self-pay | Admitting: Family Medicine

## 2010-06-07 ENCOUNTER — Other Ambulatory Visit (INDEPENDENT_AMBULATORY_CARE_PROVIDER_SITE_OTHER): Payer: Medicare Other

## 2010-06-07 ENCOUNTER — Telehealth: Payer: Self-pay | Admitting: *Deleted

## 2010-06-07 DIAGNOSIS — E7800 Pure hypercholesterolemia, unspecified: Secondary | ICD-10-CM

## 2010-06-07 LAB — COMPREHENSIVE METABOLIC PANEL
Albumin: 3.5 g/dL (ref 3.5–5.2)
CO2: 27 mEq/L (ref 19–32)
Calcium: 9.2 mg/dL (ref 8.4–10.5)
Chloride: 100 mEq/L (ref 96–112)
GFR: 78.56 mL/min (ref >60.00)
Glucose, Bld: 119 mg/dL — ABNORMAL HIGH (ref 70–99)
Potassium: 5.1 mEq/L (ref 3.5–5.1)
Sodium: 133 mEq/L — ABNORMAL LOW (ref 135–145)
Total Protein: 6.7 g/dL (ref 6.0–8.3)

## 2010-06-07 LAB — LIPID PANEL: Triglycerides: 150 mg/dL — ABNORMAL HIGH (ref 0.0–149.0)

## 2010-06-07 LAB — LDL CHOLESTEROL, DIRECT: Direct LDL: 153 mg/dL

## 2010-06-07 MED ORDER — SULFAMETHOXAZOLE-TMP DS 800-160 MG PO TABS: 2.0000 | ORAL_TABLET | Freq: Two times a day (BID) | ORAL | Status: DC

## 2010-06-07 NOTE — Telephone Encounter (Signed)
Please ensure taking 2 tabs po bid   Reasonable to extend this  Send in additional Septra DS, 2 tabs po bid for additional 7 days, #[phone]  Call them

## 2010-06-07 NOTE — Telephone Encounter (Signed)
Patient's sister advised.

## 2010-06-07 NOTE — Telephone Encounter (Signed)
Patient will be out of his antibiotic for MRSA after tomorrow.  Patient's sister states that he has not been taking it the way that it was prescribed. Patient's sister wants to know if he needs to take it longer.  The area on his leg is still draining and red. Pharmacy-[facility] [address].

## 2010-06-11 ENCOUNTER — Telehealth: Payer: Self-pay | Admitting: *Deleted

## 2010-06-11 DIAGNOSIS — E871 Hypo-osmolality and hyponatremia: Secondary | ICD-10-CM | POA: Insufficient documentation

## 2010-06-11 DIAGNOSIS — R7309 Other abnormal glucose: Secondary | ICD-10-CM

## 2010-06-11 DIAGNOSIS — E7800 Pure hypercholesterolemia, unspecified: Secondary | ICD-10-CM

## 2010-06-11 MED ORDER — ROSUVASTATIN CALCIUM 20 MG PO TABS: 20.0000 mg | ORAL_TABLET | Freq: Every day | ORAL | Status: DC
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
## 2010-06-11 NOTE — Telephone Encounter (Signed)
Please review lab results from 5/4 and advise.

## 2010-06-11 NOTE — Telephone Encounter (Signed)
Patient advised and he says if you think it best to change to crestor [facility] [address] also appt made for may 28 to recheck labs for sodium. Patient was not interested in low carb diet info

## 2010-06-14 ENCOUNTER — Telehealth: Payer: Self-pay | Admitting: *Deleted

## 2010-06-14 MED ORDER — VARDENAFIL HCL 20 MG PO TABS: 10.0000 mg | ORAL_TABLET | ORAL | Status: DC | PRN

## 2010-06-14 NOTE — Telephone Encounter (Signed)
Pt is asking for script for levitra to be sent to [facility] [address].  He has scripts for viagra and cialis but these are too expensive.  [facility]

## 2010-06-15 ENCOUNTER — Other Ambulatory Visit: Payer: Self-pay | Admitting: Family Medicine

## 2010-06-16 ENCOUNTER — Other Ambulatory Visit: Payer: Self-pay | Admitting: *Deleted

## 2010-06-16 NOTE — Telephone Encounter (Signed)
Addended by: Liane Comber on: 06/16/2010 11:58 AM   Modules accepted: Orders

## 2010-06-16 NOTE — Telephone Encounter (Signed)
Pt's sister called, states the crestor pt was prescribed is too expensive.  She is asking if something less costly can be sent to [facility] [address].

## 2010-06-17 MED ORDER — IBUPROFEN 800 MG PO TABS: 800.0000 mg | ORAL_TABLET | Freq: Three times a day (TID) | ORAL | Status: DC | PRN

## 2010-06-17 NOTE — Telephone Encounter (Signed)
Pt was only placed on this for cellulitis..should not need to continue long term. Refill denied unless somethingelse ongoing per pt that I am not aware of. Let me know if there is

## 2010-06-17 NOTE — Telephone Encounter (Signed)
Travis Cantrell, this note is confusing. Is it a refill request for Motrin? Has that been done, or only a question about statin?

## 2010-06-18 ENCOUNTER — Telehealth: Payer: Self-pay | Admitting: *Deleted

## 2010-06-18 MED ORDER — ATORVASTATIN CALCIUM 80 MG PO TABS: 80.0000 mg | ORAL_TABLET | Freq: Every day | ORAL | Status: DC
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
## 2010-06-18 NOTE — Telephone Encounter (Signed)
Their is no smell or fever

## 2010-06-18 NOTE — Telephone Encounter (Signed)
Please see note below.  Also, pt's sister had called a few days ago asking to change pt from crestor to something less expensive, she didn't pick up the crestor from the pharmacy.  The note was closed without a response, uses [facility] [address].

## 2010-06-18 NOTE — Telephone Encounter (Signed)
Rx called to pharmacy

## 2010-06-18 NOTE — Telephone Encounter (Signed)
Patient is having green coming from his heel

## 2010-06-18 NOTE — Telephone Encounter (Signed)
Patient's sister advised.

## 2010-06-18 NOTE — Telephone Encounter (Signed)
Pt's sister states she has been working on wound on pt's heel and it's now draining green fluid. She asks if this is normal.  He finished his antibiotic this week, sister doesn't know for sure what day.  Please advise on what you think.  She said it doesn't look infected and pt doesn't have any fever.

## 2010-06-22 ENCOUNTER — Other Ambulatory Visit: Payer: Self-pay | Admitting: *Deleted

## 2010-06-22 MED ORDER — GABAPENTIN 300 MG PO CAPS: 900.0000 mg | ORAL_CAPSULE | Freq: Three times a day (TID) | ORAL | Status: DC

## 2010-06-25 ENCOUNTER — Ambulatory Visit: Payer: Medicare Other | Admitting: Family Medicine

## 2010-07-05 ENCOUNTER — Encounter: Payer: Self-pay | Admitting: Family Medicine

## 2010-07-05 ENCOUNTER — Ambulatory Visit (INDEPENDENT_AMBULATORY_CARE_PROVIDER_SITE_OTHER): Payer: Medicare Other | Admitting: Family Medicine

## 2010-07-05 VITALS — BP 120/60 | HR 64 | Temp 97.7°F

## 2010-07-05 DIAGNOSIS — L0291 Cutaneous abscess, unspecified: Secondary | ICD-10-CM

## 2010-07-05 DIAGNOSIS — T24339A Burn of third degree of unspecified lower leg, initial encounter: Secondary | ICD-10-CM

## 2010-07-05 DIAGNOSIS — F329 Major depressive disorder, single episode, unspecified: Secondary | ICD-10-CM

## 2010-07-05 DIAGNOSIS — E7800 Pure hypercholesterolemia, unspecified: Secondary | ICD-10-CM

## 2010-07-05 DIAGNOSIS — L0390 Cellulitis, unspecified: Secondary | ICD-10-CM

## 2010-07-05 DIAGNOSIS — M79601 Pain in right arm: Secondary | ICD-10-CM

## 2010-07-05 DIAGNOSIS — E871 Hypo-osmolality and hyponatremia: Secondary | ICD-10-CM

## 2010-07-05 DIAGNOSIS — M79609 Pain in unspecified limb: Secondary | ICD-10-CM

## 2010-07-05 LAB — BASIC METABOLIC PANEL
CO2: 30 mEq/L (ref 19–32)
Chloride: 105 mEq/L (ref 96–112)
Glucose, Bld: 108 mg/dL — ABNORMAL HIGH (ref 70–99)
Potassium: 4.2 mEq/L (ref 3.5–5.1)
Sodium: 139 mEq/L (ref 135–145)

## 2010-07-05 MED ORDER — PRAVASTATIN SODIUM 80 MG PO TABS: 80.0000 mg | ORAL_TABLET | Freq: Every evening | ORAL | Status: DC

## 2010-07-05 NOTE — Assessment & Plan Note (Signed)
Resolved in left shin, now concern  In left heel per sister, but on exam no suggestion of infection. Healing ulcer left heel with goo granulation tissue.

## 2010-07-05 NOTE — Assessment & Plan Note (Signed)
Reeval today with labs

## 2010-07-05 NOTE — Assessment & Plan Note (Signed)
Stable continue current medication

## 2010-07-05 NOTE — Patient Instructions (Addendum)
Use lipitor samples till gone then change to start pravastatin.  Fasting labs for cholesterol eval in 3 months. Will follow up appt afterward 30 min.   Call sooner if any concerns.
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
## 2010-07-05 NOTE — Assessment & Plan Note (Signed)
Cannot afford crestor or lipitor. Will try pravastatin 80 mg daily. Encouraged exercise, weight loss, healthy eating habits.  Recheck in 3 months.

## 2010-07-05 NOTE — Assessment & Plan Note (Addendum)
Continued pain.Marland Kitchen difficiult exam given pain with any movement. Pt does not feel like any pain in shoulder...states pain is simply from the tight tricep

## 2010-07-21 ENCOUNTER — Telehealth: Payer: Self-pay | Admitting: *Deleted

## 2010-07-21 ENCOUNTER — Encounter: Payer: Self-pay | Admitting: *Deleted

## 2010-07-21 NOTE — Telephone Encounter (Signed)
Patient says that the prilosec is too expensive for him and he is asking if there is anything else that he can try. [facility] [address]

## 2010-07-21 NOTE — Telephone Encounter (Signed)
Patients number disconnected and the number we have for his sister is a fax machine

## 2010-07-21 NOTE — Telephone Encounter (Signed)
Prilosec is likely the cheapest option unless his insurance covers other options. He needs to call his insurance and find out what they prefer for reflux medications. I will change it to that med.

## 2010-07-21 NOTE — Telephone Encounter (Signed)
Send letter in mail then please.

## 2010-07-21 NOTE — Telephone Encounter (Signed)
Okay letter mailed to patient

## 2010-08-02 ENCOUNTER — Telehealth: Payer: Self-pay | Admitting: *Deleted

## 2010-08-02 NOTE — Telephone Encounter (Signed)
Pt called stating that his insurance no longer wants to pay for provigil for his MS.  I advised him that he needs to call the doctor that treats his MS to see what they might want to change him to.  Pt agreed.

## 2010-08-06 NOTE — Telephone Encounter (Signed)
Yes, agree, pt needs to contact neurologist for further recommendations.

## 2010-08-06 NOTE — Telephone Encounter (Signed)
Dr. Ermalene Searing, did you see this note?

## 2010-08-09 ENCOUNTER — Observation Stay: Payer: Self-pay | Admitting: Internal Medicine

## 2010-08-10 ENCOUNTER — Telehealth: Payer: Self-pay | Admitting: *Deleted

## 2010-08-10 NOTE — Telephone Encounter (Signed)
Patient's nurse advised

## 2010-08-10 NOTE — Telephone Encounter (Signed)
Yes please add wound care. Hospital note reviewed.

## 2010-08-10 NOTE — Telephone Encounter (Signed)
Home health nurse called to report that home health is following pt for fluid retention and foley cath.  Nurse reports that  pt has several skin ulcers and she is asking if you want them to perform wound care.  Please advise.

## 2010-08-12 ENCOUNTER — Telehealth: Payer: Self-pay | Admitting: *Deleted

## 2010-08-12 NOTE — Telephone Encounter (Signed)
Home health nurse called to get verbal orders for gel overlay for hospital bed to help with pt's sores.  He also needs bedside commode.  Please advise.

## 2010-08-13 NOTE — Telephone Encounter (Signed)
Left verbal order for bridgette  At [facility]

## 2010-08-13 NOTE — Telephone Encounter (Signed)
Okay.. Please give verbal order as requested.

## 2010-08-23 ENCOUNTER — Telehealth: Payer: Self-pay | Admitting: *Deleted

## 2010-08-23 ENCOUNTER — Emergency Department: Payer: Self-pay | Admitting: *Deleted

## 2010-08-23 NOTE — Telephone Encounter (Signed)
Patient had to go to ER last week and was sent home with a cath because he wasn't able to urinate. He has now been home for several days and no one nurse was supposed to come out and check his cath today, but they didn't show up. The cath came out last night so the sister put it back in and now today his stomach is swollen. I advised that they take him to the ER.

## 2010-08-23 NOTE — Telephone Encounter (Signed)
i agree - needs urgent eval.

## 2010-08-24 ENCOUNTER — Telehealth: Payer: Self-pay | Admitting: *Deleted

## 2010-08-24 DIAGNOSIS — G35 Multiple sclerosis: Secondary | ICD-10-CM

## 2010-08-24 NOTE — Telephone Encounter (Addendum)
Patient received a letter from [facility] in [place] that he needs a referral from his PCP to see Dr. Irving Burton Pharr/neurologist. Patient has been seeing her for his MS, but has not seen her for a while. Patient has an appointment with Dr. Renne Crigler on 08/31/10. Please let patient know when the referral is done.

## 2010-08-25 NOTE — Telephone Encounter (Signed)
Referral called in to [facility]. No referral was needed for this appt. Last office visit note was faxed to Dr Renne Crigler.

## 2010-09-09 DIAGNOSIS — L89309 Pressure ulcer of unspecified buttock, unspecified stage: Secondary | ICD-10-CM

## 2010-09-09 DIAGNOSIS — L89609 Pressure ulcer of unspecified heel, unspecified stage: Secondary | ICD-10-CM

## 2010-09-09 DIAGNOSIS — R339 Retention of urine, unspecified: Secondary | ICD-10-CM

## 2010-09-09 DIAGNOSIS — L8992 Pressure ulcer of unspecified site, stage 2: Secondary | ICD-10-CM

## 2010-09-10 ENCOUNTER — Other Ambulatory Visit: Payer: Self-pay | Admitting: *Deleted

## 2010-09-10 MED ORDER — GABAPENTIN 300 MG PO CAPS: 900.0000 mg | ORAL_CAPSULE | Freq: Three times a day (TID) | ORAL | Status: DC

## 2010-09-21 ENCOUNTER — Telehealth: Payer: Self-pay | Admitting: *Deleted

## 2010-09-21 NOTE — Telephone Encounter (Signed)
Chart opened in error

## 2010-09-25 ENCOUNTER — Inpatient Hospital Stay: Payer: Self-pay | Admitting: *Deleted

## 2010-09-27 ENCOUNTER — Other Ambulatory Visit: Payer: Self-pay | Admitting: Family Medicine

## 2010-09-28 ENCOUNTER — Telehealth: Payer: Self-pay | Admitting: *Deleted

## 2010-09-28 NOTE — Telephone Encounter (Signed)
Pt was at [facility] from 8/31- 09/27/10 for SIRS, secondary to UTI.  Home health nurse is asking for orders to change foley cath monthly, change duoderm on his ulcers.  He is coming here for 3 month follow up on 9/17, getting labs on 9/10 and she also asks if you want them to draw the labs.  If yes to all of this they will need orders faxed to their [place] office, person to contact is Mervyn Skeeters, phone [phone].

## 2010-09-29 NOTE — Telephone Encounter (Signed)
Prescription in outbox

## 2010-09-29 NOTE — Telephone Encounter (Signed)
Order faxed to [facility] at [phone].  Called the [place] office and spoke with Mervyn Skeeters who advised me that this patient is being managed by the [facility] office and gave me the name and number to fax order to.

## 2010-09-30 ENCOUNTER — Telehealth: Payer: Self-pay | Admitting: *Deleted

## 2010-09-30 NOTE — Telephone Encounter (Signed)
Patients nurse advised and she will call back about urology follow up

## 2010-09-30 NOTE — Telephone Encounter (Signed)
Labs should be done ASAP but fasting. As far as foley.. I have no idea what he has in now.  Does he have urologist follow up for eventual removal or is the plan for leaving it in longterm? I would just change it to the same size he has in now if that is 16 french, 10ccs that is fine. We need plan though so let me know about Uro follow up.

## 2010-09-30 NOTE — Telephone Encounter (Signed)
Home health nurse received the orders that were sent in for the patient but she is asking when to draw the fasting labs, she says it wont be today, and what size foley to use.  Asking if ok to start with 16 french, 10 cc's?  Verbal orders are ok.

## 2010-10-01 LAB — LIPID PANEL
LDl/HDL Ratio: 9.7
Triglycerides: 177 mg/dL — AB (ref 40–160)

## 2010-10-01 LAB — HEPATIC FUNCTION PANEL: Alkaline Phosphatase: 71 U/L (ref 25–125)

## 2010-10-01 LAB — BASIC METABOLIC PANEL
Creatinine: 0.6 mg/dL (ref 0.6–1.3)
Potassium: 4.5 mmol/L (ref 3.4–5.3)
Sodium: 136 mmol/L — AB (ref 137–147)

## 2010-10-04 ENCOUNTER — Telehealth: Payer: Self-pay | Admitting: *Deleted

## 2010-10-04 ENCOUNTER — Other Ambulatory Visit: Payer: Medicare Other

## 2010-10-04 NOTE — Telephone Encounter (Signed)
Pt was to have come in for labs this morning but he didn't because he has recently been in hospital and had labs done there and by home health.  I advised him that what he had drawn in the hospital and at home may not be the same labs that you would want drawn.  I advised him to come in fasting the morning of his appt on 9/17, in case to want labs drawn.

## 2010-10-05 ENCOUNTER — Encounter: Payer: Self-pay | Admitting: Family Medicine

## 2010-10-06 NOTE — Telephone Encounter (Signed)
Agreed -

## 2010-10-08 ENCOUNTER — Telehealth: Payer: Self-pay | Admitting: *Deleted

## 2010-10-08 NOTE — Telephone Encounter (Signed)
i will complete today.. Do they plan on SNF or assisted living?

## 2010-10-08 NOTE — Telephone Encounter (Signed)
Spoke with Travis Cantrell and he will be in SNF and she will pick form up on Monday morning

## 2010-10-08 NOTE — Telephone Encounter (Signed)
Will complete 

## 2010-10-08 NOTE — Telephone Encounter (Signed)
Social worker from [facility] called to check on the status of FL2 form that was dropped off.  This is on your desk.  She says pt's caregiver is very pregnant and scheduled for a c- section and is not able to care for him.  They are trying to get him placed ASAP.

## 2010-10-11 ENCOUNTER — Ambulatory Visit (INDEPENDENT_AMBULATORY_CARE_PROVIDER_SITE_OTHER): Payer: Medicare Other | Admitting: Family Medicine

## 2010-10-11 ENCOUNTER — Encounter: Payer: Self-pay | Admitting: Family Medicine

## 2010-10-11 ENCOUNTER — Telehealth: Payer: Self-pay | Admitting: *Deleted

## 2010-10-11 VITALS — BP 130/80 | HR 57 | Temp 98.5°F

## 2010-10-11 DIAGNOSIS — E7800 Pure hypercholesterolemia, unspecified: Secondary | ICD-10-CM

## 2010-10-11 DIAGNOSIS — L8990 Pressure ulcer of unspecified site, unspecified stage: Secondary | ICD-10-CM

## 2010-10-11 DIAGNOSIS — Z23 Encounter for immunization: Secondary | ICD-10-CM

## 2010-10-11 DIAGNOSIS — L89309 Pressure ulcer of unspecified buttock, unspecified stage: Secondary | ICD-10-CM

## 2010-10-11 DIAGNOSIS — R7309 Other abnormal glucose: Secondary | ICD-10-CM

## 2010-10-11 DIAGNOSIS — E871 Hypo-osmolality and hyponatremia: Secondary | ICD-10-CM

## 2010-10-11 MED ORDER — NYSTATIN 100000 UNIT/GM EX CREA: TOPICAL_CREAM | Freq: Two times a day (BID) | CUTANEOUS | Status: AC

## 2010-10-11 MED ORDER — PRAVASTATIN SODIUM 40 MG PO TABS: 80.0000 mg | ORAL_TABLET | Freq: Every evening | ORAL | Status: DC

## 2010-10-11 NOTE — Assessment & Plan Note (Signed)
Imporoved control back on pravastatin. Encouraged exercise, weight loss, healthy eating habits.

## 2010-10-11 NOTE — Assessment & Plan Note (Signed)
Contiue hydrocolloid dressing.

## 2010-10-11 NOTE — Assessment & Plan Note (Signed)
Borderline. Nml in hospital after our labs were done. Reeval in 3 months

## 2010-10-11 NOTE — Telephone Encounter (Signed)
Cindy at [facility] was given verbal okay and they were okay with that

## 2010-10-11 NOTE — Patient Instructions (Addendum)
Continue pravastatin but for cost.. Change to 40 mg TWO tabs daily. Apply cream to groin... Call if not improving as expected. Apply for 48 hours AFTER rash has resolved to make sure it stays away.

## 2010-10-11 NOTE — Assessment & Plan Note (Signed)
Resolved

## 2010-10-11 NOTE — Progress Notes (Signed)
  Subjective:    Patient ID: Travis Cantrell, male    DOB: 01-18-1961, 50 y.o.   MRN: [id number]  HPI  50 year old male with MS admitted to [facility] 9/1 to 9/3 for   SIRS secondary to UTI in setting of chronic foley catheter. Indwelling removed. He has an appointment with Urologist in area to discuss future of  penile catheter. Has appt 9/26.  Redness between legs on thighs, itchy... Feels candidal infection... Currently using OTC med, helping some but not much.  He is also planning on fast entry into SNF  For 21 days given primary caregiver will be unavailable for a while.   Decubitus on left heel and central buttock. [facility] is applying hydrocollloid dressing to both areas.   Elevated Cholesterol: Improved on pravastatin 80 mg daily. Has baked food lately. Using medications without problems: None Muscle aches: None Other complaints:     Review of Systems  Constitutional: Negative for fever and fatigue.  HENT: Negative for ear pain.   Cardiovascular: Negative for chest pain, palpitations and leg swelling.  Gastrointestinal: Negative for abdominal pain and abdominal distention.  Genitourinary: Negative for dysuria and hematuria.       Objective:   Physical Exam  Constitutional: He is oriented to person, place, and time. He appears well-developed and well-nourished.       Wheelchair bound.  HENT:  Head: Normocephalic and atraumatic.  Right Ear: External ear normal.  Left Ear: External ear normal.  Eyes: Conjunctivae and EOM are normal. Pupils are equal, round, and reactive to light.  Neck: Normal range of motion. Neck supple.  Cardiovascular: Normal rate, regular rhythm and normal heart sounds.  Exam reveals no gallop and no friction rub.   No murmur heard. Pulmonary/Chest: Effort normal and breath sounds normal. No respiratory distress. He has no wheezes. He has no rales. He exhibits no tenderness.  Abdominal: Soft. Bowel sounds are normal. There is no tenderness.    Neurological: He is alert and oriented to person, place, and time.  Skin:       Moist erythematous rash in groin and upper thighs  decub on buttock and right heel... Not examined given dressing.  Psychiatric: He has a normal mood and affect. His behavior is normal. Judgment and thought content normal.          Assessment & Plan:  re

## 2010-10-11 NOTE — Telephone Encounter (Signed)
Call from home health nurse.  She is asking to extend pt's skilled nursing visits to once a week for 2 more weeks and bath aid visits to two times a week for 2 more weeks.  Pt will be going into skilled nursing facility at the first of October.

## 2010-10-11 NOTE — Telephone Encounter (Signed)
Okay to extend as requested. Is verbal order okay?

## 2010-11-12 ENCOUNTER — Telehealth: Payer: Self-pay | Admitting: *Deleted

## 2010-11-12 DIAGNOSIS — R197 Diarrhea, unspecified: Secondary | ICD-10-CM

## 2010-11-12 NOTE — Telephone Encounter (Signed)
Okay to take 2 tab po daily, but if diarrhea not improving in 48 -72 hours.. Please call for further eval for cdifficle infection.

## 2010-11-12 NOTE — Telephone Encounter (Signed)
Picked up stool container.

## 2010-11-12 NOTE — Telephone Encounter (Signed)
Patient sister wants to come and pick up c diff stool kit

## 2010-11-12 NOTE — Telephone Encounter (Signed)
Patient advised.

## 2010-11-12 NOTE — Telephone Encounter (Signed)
Pt recently out of nursing home, was given abx for uti, has since finished abx 2 days and is c/o diarrhea. Sister says they were advised to take an otc probiotic wants to know if it is ok to give him 2 tabs a day instead of 1 a day as bottle instructs.

## 2010-11-16 ENCOUNTER — Emergency Department (HOSPITAL_COMMUNITY)
Admission: EM | Admit: 2010-11-16 | Discharge: 2010-11-16 | Disposition: A | Discharge status: Home / Self Care | Payer: Medicare Other | Attending: Emergency Medicine | Admitting: Emergency Medicine

## 2010-11-16 DIAGNOSIS — N390 Urinary tract infection, site not specified: Secondary | ICD-10-CM | POA: Insufficient documentation

## 2010-11-16 DIAGNOSIS — G35 Multiple sclerosis: Secondary | ICD-10-CM | POA: Insufficient documentation

## 2010-11-16 DIAGNOSIS — Z79899 Other long term (current) drug therapy: Secondary | ICD-10-CM | POA: Insufficient documentation

## 2010-11-16 DIAGNOSIS — E7800 Pure hypercholesterolemia, unspecified: Secondary | ICD-10-CM | POA: Insufficient documentation

## 2010-11-16 LAB — CBC
HCT: 44.4 % (ref 39.0–52.0)
Hemoglobin: 14.7 g/dL (ref 13.0–17.0)
MCHC: 33.1 g/dL (ref 30.0–36.0)
MCV: 82.1 fL (ref 78.0–100.0)
RDW: 14.1 % (ref 11.5–15.5)

## 2010-11-16 LAB — URINALYSIS, ROUTINE W REFLEX MICROSCOPIC
Bilirubin Urine: NEGATIVE
Glucose, UA: NEGATIVE mg/dL
Protein, ur: 100 mg/dL — AB
Specific Gravity, Urine: 1.017 (ref 1.005–1.030)
Urobilinogen, UA: 1 mg/dL (ref 0.0–1.0)

## 2010-11-16 LAB — URINE MICROSCOPIC-ADD ON

## 2010-11-16 LAB — BASIC METABOLIC PANEL
CO2: 25 mEq/L (ref 19–32)
Calcium: 9.3 mg/dL (ref 8.4–10.5)
Creatinine, Ser: 0.63 mg/dL (ref 0.50–1.35)
GFR calc non Af Amer: >90 mL/min (ref >90)

## 2010-11-18 ENCOUNTER — Ambulatory Visit: Payer: Medicare Other | Admitting: Family Medicine

## 2010-11-29 ENCOUNTER — Other Ambulatory Visit: Payer: Self-pay | Admitting: Family Medicine

## 2010-11-29 NOTE — Telephone Encounter (Signed)
Rx called to pharmacy

## 2010-12-06 ENCOUNTER — Encounter: Payer: Self-pay | Admitting: Family Medicine

## 2010-12-06 ENCOUNTER — Ambulatory Visit (INDEPENDENT_AMBULATORY_CARE_PROVIDER_SITE_OTHER): Payer: Medicare Other | Admitting: Family Medicine

## 2010-12-06 DIAGNOSIS — Z960 Presence of urogenital implants: Secondary | ICD-10-CM | POA: Insufficient documentation

## 2010-12-06 DIAGNOSIS — E7800 Pure hypercholesterolemia, unspecified: Secondary | ICD-10-CM

## 2010-12-06 DIAGNOSIS — R0981 Nasal congestion: Secondary | ICD-10-CM | POA: Insufficient documentation

## 2010-12-06 DIAGNOSIS — G35D Multiple sclerosis, unspecified: Secondary | ICD-10-CM

## 2010-12-06 DIAGNOSIS — L89309 Pressure ulcer of unspecified buttock, unspecified stage: Secondary | ICD-10-CM

## 2010-12-06 DIAGNOSIS — L219 Seborrheic dermatitis, unspecified: Secondary | ICD-10-CM

## 2010-12-06 DIAGNOSIS — G35 Multiple sclerosis: Secondary | ICD-10-CM

## 2010-12-06 DIAGNOSIS — T24339A Burn of third degree of unspecified lower leg, initial encounter: Secondary | ICD-10-CM

## 2010-12-06 DIAGNOSIS — F329 Major depressive disorder, single episode, unspecified: Secondary | ICD-10-CM

## 2010-12-06 DIAGNOSIS — F3289 Other specified depressive episodes: Secondary | ICD-10-CM

## 2010-12-06 DIAGNOSIS — J3489 Other specified disorders of nose and nasal sinuses: Secondary | ICD-10-CM

## 2010-12-06 DIAGNOSIS — L8990 Pressure ulcer of unspecified site, unspecified stage: Secondary | ICD-10-CM

## 2010-12-06 DIAGNOSIS — T83511A Infection and inflammatory reaction due to indwelling urethral catheter, initial encounter: Secondary | ICD-10-CM

## 2010-12-06 MED ORDER — KETOCONAZOLE 2 % EX CREA: TOPICAL_CREAM | Freq: Two times a day (BID) | CUTANEOUS | Status: DC

## 2010-12-06 MED ORDER — CEPHALEXIN 500 MG PO CAPS: 500.0000 mg | ORAL_CAPSULE | Freq: Four times a day (QID) | ORAL | Status: DC

## 2010-12-06 MED ORDER — KETOCONAZOLE 2 % EX SHAM: MEDICATED_SHAMPOO | CUTANEOUS | Status: DC

## 2010-12-06 NOTE — Assessment & Plan Note (Signed)
Stable control at last check on pravastatin.

## 2010-12-06 NOTE — Assessment & Plan Note (Signed)
Large scab debrided with fingers... No suggestion of infection at this time.

## 2010-12-06 NOTE — Assessment & Plan Note (Signed)
On no medication, no options pre pt from last Neuro appt. No longer seeing neuro.  Using Valium for contractures and limb pain.

## 2010-12-06 NOTE — Assessment & Plan Note (Signed)
Possible viral infection vs allergy. USe mucinex in Am. No sign of bacterial infection. Pt is nonsmoker.

## 2010-12-06 NOTE — Assessment & Plan Note (Signed)
Pt displeased with [facility] visit felt nothing done, never saw MD Appears  He was supposed to have UDS study then follow up with MD... Pt not aware.  Will provide prescription for keflex to use for UTI if symptoms begin as he usually know when symptoms starting.  If additional UTI in next three months... I strongly suggest returning to [facility] for further recs.. ? antibiotic prophylaxis or other. Pt not interested in suprapubic cath which does not appear to make a big change in risk of infection per [employer] anyway.

## 2010-12-06 NOTE — Assessment & Plan Note (Signed)
Stable control on cymbalta and prozac.

## 2010-12-06 NOTE — Assessment & Plan Note (Signed)
Moisturize with aveeno more often after bathing. Trial of  ketoconazole shampoo and cream to treat.
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
## 2010-12-06 NOTE — Assessment & Plan Note (Signed)
Buttock decubitus healed, left heel with scab... Debrided but healing.

## 2010-12-06 NOTE — Patient Instructions (Addendum)
I will give you prescriptionof keflex to use if UTI symptoms start as you usually recognize. Please call if any fever or no response in first 48-72 hours. If you have another UTI in the next 3 months.. I would recommend returning to [facility] for visit with MD for other other recommendations such as preventative medication or suprapubic cath.  Try mucinex (guafenesin) for nasal congestion.  3 month follow up in 30 min multiple issue.

## 2010-12-14 ENCOUNTER — Telehealth: Payer: Self-pay | Admitting: Family Medicine

## 2010-12-14 NOTE — Telephone Encounter (Signed)
Message copied by Excell Seltzer on Tue Dec 14, 2010 10:35 PM ------      Message from: Alvina Chou      Created: Fri Dec 10, 2010  4:56 PM       Patient never returned stool samples for c diff. Do you want me to cancel? Thanks, Camelia Eng

## 2010-12-14 NOTE — Telephone Encounter (Signed)
Please call pt.. Is he still having diarrhea.. If no cancel stool samples.

## 2010-12-23 ENCOUNTER — Other Ambulatory Visit: Payer: Self-pay | Admitting: Family Medicine

## 2010-12-23 NOTE — Telephone Encounter (Signed)
Please call pt.. Why is he requesting refill of this so soon.Marland Kitchen Has he taken one course already?  Should be seen if urinary symptoms not improving after one course.

## 2010-12-27 ENCOUNTER — Other Ambulatory Visit: Payer: Self-pay | Admitting: *Deleted

## 2010-12-27 NOTE — Telephone Encounter (Signed)
Patients sister advised and she will advise patient

## 2011-01-03 ENCOUNTER — Other Ambulatory Visit: Payer: Self-pay | Admitting: Family Medicine

## 2011-01-03 ENCOUNTER — Ambulatory Visit: Payer: Medicare Other | Admitting: Family Medicine

## 2011-01-25 ENCOUNTER — Other Ambulatory Visit: Payer: Self-pay | Admitting: Family Medicine

## 2011-01-26 ENCOUNTER — Other Ambulatory Visit: Payer: Self-pay | Admitting: Family Medicine

## 2011-02-14 ENCOUNTER — Ambulatory Visit: Payer: Medicare Other | Admitting: Family Medicine

## 2011-02-16 ENCOUNTER — Telehealth: Payer: Self-pay | Admitting: Family Medicine

## 2011-02-16 NOTE — Telephone Encounter (Signed)
Complex case - needs OV for eval and cultures. Today if possible

## 2011-02-16 NOTE — Telephone Encounter (Signed)
Patients care giver would not bring patient today and he is going to try and come tomorrow at 3:15

## 2011-02-16 NOTE — Telephone Encounter (Signed)
Patient states he has had a UTI that bypasses his catheter and that he needs the same medication that Dr. Ermalene Searing prescribed to him the last time.  He said that it is the same symptoms that he had with the last UTI and the medication helped.  Please call patient to confirm/follow-up.  Thanks

## 2011-02-17 ENCOUNTER — Ambulatory Visit (INDEPENDENT_AMBULATORY_CARE_PROVIDER_SITE_OTHER): Payer: Medicare Other | Admitting: Family Medicine

## 2011-02-17 DIAGNOSIS — N390 Urinary tract infection, site not specified: Secondary | ICD-10-CM

## 2011-02-17 MED ORDER — CIPROFLOXACIN HCL 250 MG PO TABS: 250.0000 mg | ORAL_TABLET | Freq: Two times a day (BID) | ORAL | Status: AC

## 2011-02-17 NOTE — Progress Notes (Signed)
The patient cancelled OV. Transportation issues. Caregiver attempting to bring urine sample for culture.   I am going to go ahead and send in cipro 250 mg bid, given his individual circumstances and risk.

## 2011-02-18 ENCOUNTER — Telehealth (INDEPENDENT_AMBULATORY_CARE_PROVIDER_SITE_OTHER): Payer: Medicare Other | Admitting: *Deleted

## 2011-02-18 DIAGNOSIS — R300 Dysuria: Secondary | ICD-10-CM

## 2011-02-18 LAB — POCT URINALYSIS DIPSTICK
Ketones, UA: NEGATIVE
Protein, UA: 100
Spec Grav, UA: 1.025
pH, UA: 7

## 2011-02-18 NOTE — Telephone Encounter (Deleted)
Patient was given ok by dr copland to bring in urine because patients scooter broke

## 2011-02-18 NOTE — Telephone Encounter (Signed)
Patient daughter brought by urine

## 2011-02-19 ENCOUNTER — Other Ambulatory Visit: Payer: Self-pay | Admitting: Family Medicine

## 2011-02-21 NOTE — Telephone Encounter (Signed)
Urine sent for culture

## 2011-02-22 LAB — URINE CULTURE

## 2011-02-25 ENCOUNTER — Ambulatory Visit: Payer: Medicare Other | Admitting: Family Medicine

## 2011-03-20 ENCOUNTER — Other Ambulatory Visit: Payer: Self-pay | Admitting: Family Medicine

## 2011-03-21 NOTE — Telephone Encounter (Signed)
Rx called to pharmacy

## 2011-03-23 ENCOUNTER — Emergency Department: Payer: Self-pay

## 2011-03-23 LAB — BASIC METABOLIC PANEL
BUN: 19 mg/dL — ABNORMAL HIGH (ref 7–18)
Calcium, Total: 9 mg/dL (ref 8.5–10.1)
Co2: 30 mmol/L (ref 21–32)
Creatinine: 0.86 mg/dL (ref 0.60–1.30)
EGFR (African American): >60
Glucose: 111 mg/dL — ABNORMAL HIGH (ref 65–99)
Osmolality: 286 (ref 275–301)
Potassium: 3.9 mmol/L (ref 3.5–5.1)
Sodium: 142 mmol/L (ref 136–145)

## 2011-03-23 LAB — URINALYSIS, COMPLETE
Bilirubin,UR: NEGATIVE
Glucose,UR: NEGATIVE mg/dL (ref 0–75)
RBC,UR: 75 /HPF (ref 0–5)
Squamous Epithelial: NONE SEEN
WBC UR: 97 /HPF (ref 0–5)

## 2011-03-23 LAB — CBC
HGB: 16.6 g/dL (ref 13.0–18.0)
MCHC: 33.2 g/dL (ref 32.0–36.0)
RDW: 14.6 % — ABNORMAL HIGH (ref 11.5–14.5)
WBC: 12.4 10*3/uL — ABNORMAL HIGH (ref 3.8–10.6)

## 2011-04-01 ENCOUNTER — Emergency Department: Payer: Self-pay | Admitting: *Deleted

## 2011-04-02 LAB — URINALYSIS, COMPLETE
Glucose,UR: NEGATIVE mg/dL (ref 0–75)
Ketone: NEGATIVE
Nitrite: NEGATIVE
Protein: NEGATIVE
RBC,UR: 9 /HPF (ref 0–5)
WBC UR: 23 /HPF (ref 0–5)

## 2011-04-07 LAB — URINE CULTURE

## 2011-04-08 ENCOUNTER — Other Ambulatory Visit: Payer: Self-pay | Admitting: *Deleted

## 2011-04-08 MED ORDER — AMOXICILLIN 875 MG PO TABS: 875.0000 mg | ORAL_TABLET | Freq: Two times a day (BID) | ORAL | Status: AC

## 2011-04-23 ENCOUNTER — Emergency Department: Payer: Self-pay | Admitting: Emergency Medicine

## 2011-05-02 ENCOUNTER — Other Ambulatory Visit: Payer: Self-pay | Admitting: Family Medicine

## 2011-05-06 ENCOUNTER — Emergency Department: Payer: Self-pay | Admitting: Emergency Medicine

## 2011-05-07 LAB — URINALYSIS, COMPLETE
Glucose,UR: NEGATIVE mg/dL (ref 0–75)
Nitrite: NEGATIVE
Ph: 5 (ref 4.5–8.0)
RBC,UR: 822 /HPF (ref 0–5)
Specific Gravity: 1.019 (ref 1.003–1.030)

## 2011-05-09 LAB — URINE CULTURE

## 2011-05-20 ENCOUNTER — Other Ambulatory Visit: Payer: Self-pay | Admitting: Family Medicine

## 2011-05-20 NOTE — Telephone Encounter (Signed)
rx called to pharmacy 

## 2011-05-28 ENCOUNTER — Encounter (HOSPITAL_COMMUNITY): Payer: Self-pay | Admitting: *Deleted

## 2011-05-28 ENCOUNTER — Emergency Department (HOSPITAL_COMMUNITY)
Admission: EM | Admit: 2011-05-28 | Discharge: 2011-05-29 | Disposition: A | Discharge status: Home / Self Care | Payer: Medicare Other | Attending: Emergency Medicine | Admitting: Emergency Medicine

## 2011-05-28 DIAGNOSIS — N390 Urinary tract infection, site not specified: Secondary | ICD-10-CM | POA: Insufficient documentation

## 2011-05-28 LAB — URINALYSIS, ROUTINE W REFLEX MICROSCOPIC
Nitrite: NEGATIVE
Specific Gravity, Urine: 1.02 (ref 1.005–1.030)
pH: 7 (ref 5.0–8.0)

## 2011-05-28 MED ORDER — CEPHALEXIN 500 MG PO CAPS: 500.0000 mg | ORAL_CAPSULE | Freq: Four times a day (QID) | ORAL | Status: AC

## 2011-05-28 NOTE — ED Notes (Signed)
WUJ:[id number]<JY> Expected date:<BR> Expected time: 4:31 PM<BR> Means of arrival:Ambulance<BR> Comments:<BR> M61 -- Possible Catheter Infection

## 2011-05-28 NOTE — ED Notes (Signed)
Pt has chronic Foley cath due to MS. Has frequent UTI's and today started leaking around catheter.

## 2011-05-28 NOTE — ED Notes (Signed)
Pt states he came here because his urologist is here and if he is admitted he will not have  To transfer

## 2011-05-28 NOTE — Discharge Instructions (Signed)
Followup with your urologist next week Urinary Tract Infection Infections of the urinary tract can start in several places. A bladder infection (cystitis), a kidney infection (pyelonephritis), and a prostate infection (prostatitis) are different types of urinary tract infections (UTIs). They usually get better if treated with medicines (antibiotics) that kill germs. Take all the medicine until it is gone. You or your child may feel better in a few days, but TAKE ALL MEDICINE or the infection may not respond and may become more difficult to treat. HOME CARE INSTRUCTIONS   Drink enough water and fluids to keep the urine clear or pale yellow. Cranberry juice is especially recommended, in addition to large amounts of water.   Avoid caffeine, tea, and carbonated beverages. They tend to irritate the bladder.   Alcohol may irritate the prostate.   Only take over-the-counter or prescription medicines for pain, discomfort, or fever as directed by your caregiver.  To prevent further infections:  Empty the bladder often. Avoid holding urine for long periods of time.   After a bowel movement, women should cleanse from front to back. Use each tissue only once.   Empty the bladder before and after sexual intercourse.  FINDING OUT THE RESULTS OF YOUR TEST Not all test results are available during your visit. If your or your child's test results are not back during the visit, make an appointment with your caregiver to find out the results. Do not assume everything is normal if you have not heard from your caregiver or the medical facility. It is important for you to follow up on all test results. SEEK MEDICAL CARE IF:   There is back pain.   Your baby is older than 3 months with a rectal temperature of 100.5 F (38.1 C) or higher for more than 1 day.   Your or your child's problems (symptoms) are no better in 3 days. Return sooner if you or your child is getting worse.  SEEK IMMEDIATE MEDICAL CARE IF:     There is severe back pain or lower abdominal pain.   You or your child develops chills.   You have a fever.   Your baby is older than 3 months with a rectal temperature of 102 F (38.9 C) or higher.   Your baby is 5 months old or younger with a rectal temperature of 100.4 F (38 C) or higher.   There is nausea or vomiting.   There is continued burning or discomfort with urination.  MAKE SURE YOU:   Understand these instructions.   Will watch your condition.   Will get help right away if you are not doing well or get worse.  Document Released: 10/20/2004 Document Revised: 12/30/2010 Document Reviewed: 05/25/2006 [facility] Patient Information 2012 [place], [place].

## 2011-05-28 NOTE — ED Provider Notes (Signed)
History     CSN: [id number]  Arrival date & time 05/28/11  1642   First MD Initiated Contact with Patient 05/28/11 1724      Chief Complaint  Patient presents with  . Urinary Tract Infection    (Consider location/radiation/quality/duration/timing/severity/associated sxs/prior treatment) Patient is a 51 y.o. male presenting with urinary tract infection. The history is provided by the patient.  Urinary Tract Infection  pt here with foley dysfunction and possible uti--no fever, abd pain, or back pain --h/o chronic indwelling foley from MS--no tx prior to arrival  Past Medical History  Diagnosis Date  . Allergy   . GERD (gastroesophageal reflux disease)     Past Surgical History  Procedure Date  . Vein reconsruction   . Rotator cuff repair     Family History  Problem Relation Age of Onset  . Cancer Mother     multiple myeloma  . Diabetes Father   . Heart attack Father     History  Substance Use Topics  . Smoking status: Never Smoker   . Smokeless tobacco: Not on file  . Alcohol Use: Yes      Review of Systems  All other systems reviewed and are negative.    Allergies  Baclofen  Home Medications   Current Outpatient Rx  Name Route Sig Dispense Refill  . DIAZEPAM 5 MG PO TABS Oral Take 5 mg by mouth daily as needed. For pain and muscle tightness    . FLUOXETINE HCL 20 MG PO CAPS Oral Take 20 mg by mouth at bedtime.    Marland Kitchen GABAPENTIN 300 MG PO CAPS Oral Take 3 capsules (900 mg total) by mouth 3 (three) times daily. 360 capsule 2  . IBUPROFEN 800 MG PO TABS Oral Take 800 mg by mouth every 8 (eight) hours as needed. For pain    . NYSTATIN 100000 UNIT/GM EX CREA Topical Apply topically 2 (two) times daily. 30 g 1  . OMEPRAZOLE 20 MG PO CPDR Oral Take 20 mg by mouth daily.      BP 147/98  Pulse 55  Temp(Src) 97.9 F (36.6 C) (Oral)  Resp 20  SpO2 98%  Physical Exam  Nursing note and vitals reviewed. Constitutional: He appears well-developed and  well-nourished.  Non-toxic appearance. No distress.  HENT:  Head: Normocephalic and atraumatic.  Eyes: Conjunctivae, EOM and lids are normal. Pupils are equal, round, and reactive to light.  Neck: Normal range of motion. Neck supple. No tracheal deviation present. No mass present.  Cardiovascular: Normal rate, regular rhythm and normal heart sounds.  Exam reveals no gallop.   No murmur heard. Pulmonary/Chest: Effort normal and breath sounds normal. No stridor. No respiratory distress. He has no decreased breath sounds. He has no wheezes. He has no rhonchi. He has no rales.  Abdominal: Soft. Normal appearance and bowel sounds are normal. He exhibits no distension. There is no tenderness. There is no rebound and no CVA tenderness.  Genitourinary: Circumcised.       Foul smelling urine  Musculoskeletal: Normal range of motion. He exhibits no edema and no tenderness.  Neurological: He is alert. No cranial nerve deficit. GCS eye subscore is 4. GCS verbal subscore is 5. GCS motor subscore is 6.  Skin: Skin is warm and dry. No abrasion and no rash noted.  Psychiatric: He has a normal mood and affect. His speech is normal and behavior is normal.    ED Course  Procedures (including critical care time)   Labs Reviewed  URINALYSIS,  ROUTINE W REFLEX MICROSCOPIC  URINE CULTURE   No results found.   No diagnosis found.    MDM  Patient had his Foley catheter replaced by nursing. He'll be started on some empiric antibiotics for urinary tract infection and urine culture has been sent        Toy Baker, MD 05/28/11 2137

## 2011-05-28 NOTE — ED Notes (Signed)
Spoke with Dr Freida Busman,  States pt "prescription"  escribed to pharmacy,  Aware of elevated /difference in blood pressure,  He states for pt to take medication when he gets home.

## 2011-05-28 NOTE — ED Notes (Signed)
[facility]  Called for pickup,

## 2011-05-30 LAB — URINE CULTURE: Culture  Setup Time: 201305050124

## 2011-06-07 ENCOUNTER — Ambulatory Visit: Payer: Medicare Other | Admitting: Family Medicine

## 2011-06-17 ENCOUNTER — Ambulatory Visit (INDEPENDENT_AMBULATORY_CARE_PROVIDER_SITE_OTHER): Payer: Medicare Other | Admitting: Family Medicine

## 2011-06-17 ENCOUNTER — Encounter: Payer: Self-pay | Admitting: Family Medicine

## 2011-06-17 VITALS — BP 130/80 | HR 69 | Temp 98.6°F

## 2011-06-17 DIAGNOSIS — L219 Seborrheic dermatitis, unspecified: Secondary | ICD-10-CM

## 2011-06-17 DIAGNOSIS — M7918 Myalgia, other site: Secondary | ICD-10-CM

## 2011-06-17 DIAGNOSIS — IMO0001 Reserved for inherently not codable concepts without codable children: Secondary | ICD-10-CM

## 2011-06-17 DIAGNOSIS — F3289 Other specified depressive episodes: Secondary | ICD-10-CM

## 2011-06-17 DIAGNOSIS — Z0000 Encounter for general adult medical examination without abnormal findings: Secondary | ICD-10-CM

## 2011-06-17 DIAGNOSIS — R7309 Other abnormal glucose: Secondary | ICD-10-CM

## 2011-06-17 DIAGNOSIS — Z125 Encounter for screening for malignant neoplasm of prostate: Secondary | ICD-10-CM

## 2011-06-17 DIAGNOSIS — F329 Major depressive disorder, single episode, unspecified: Secondary | ICD-10-CM

## 2011-06-17 DIAGNOSIS — E7800 Pure hypercholesterolemia, unspecified: Secondary | ICD-10-CM

## 2011-06-17 MED ORDER — MELOXICAM 15 MG PO TABS: 15.0000 mg | ORAL_TABLET | Freq: Every day | ORAL | Status: AC
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
## 2011-06-17 NOTE — Assessment & Plan Note (Signed)
Due for re-eval. 

## 2011-06-17 NOTE — Patient Instructions (Addendum)
Return for fasting labs in next week.  Try to decrease pressure on right buttock , use wedges. Stretches as able. Stop ibuprofen. In place can use meloxicam 15 mg once daily. Let me know if it is not helping .

## 2011-06-17 NOTE — Assessment & Plan Note (Signed)
Improved with ketoconazole.

## 2011-06-17 NOTE — Assessment & Plan Note (Signed)
Likely from limited movement, pressure on buttock from wheelchair bound state, no decubitus per pt. ? Arthritis, ? Piriformis syndrome. Treat with stretching as able, roll weight off right buttock as able. Meloxicam as needed in place of ibuprofen.

## 2011-06-17 NOTE — Assessment & Plan Note (Signed)
Stable control. 

## 2011-06-21 ENCOUNTER — Other Ambulatory Visit: Payer: Medicare Other

## 2011-06-22 ENCOUNTER — Other Ambulatory Visit (INDEPENDENT_AMBULATORY_CARE_PROVIDER_SITE_OTHER): Payer: Medicare Other

## 2011-06-22 DIAGNOSIS — E7800 Pure hypercholesterolemia, unspecified: Secondary | ICD-10-CM

## 2011-06-22 DIAGNOSIS — Z125 Encounter for screening for malignant neoplasm of prostate: Secondary | ICD-10-CM

## 2011-06-22 DIAGNOSIS — R7309 Other abnormal glucose: Secondary | ICD-10-CM

## 2011-06-22 LAB — COMPREHENSIVE METABOLIC PANEL
AST: 18 U/L (ref 0–37)
Albumin: 3.9 g/dL (ref 3.5–5.2)
BUN: 26 mg/dL — ABNORMAL HIGH (ref 6–23)
Calcium: 9.1 mg/dL (ref 8.4–10.5)
Chloride: 101 mEq/L (ref 96–112)
Glucose, Bld: 89 mg/dL (ref 70–99)
Potassium: 3.9 mEq/L (ref 3.5–5.1)

## 2011-06-22 LAB — LIPID PANEL
Cholesterol: 229 mg/dL — ABNORMAL HIGH (ref 0–200)
Triglycerides: 170 mg/dL — ABNORMAL HIGH (ref 0.0–149.0)

## 2011-06-22 LAB — PSA, MEDICARE: PSA: 0.61 ng/mL (ref <=4.00)

## 2011-06-29 ENCOUNTER — Other Ambulatory Visit: Payer: Self-pay | Admitting: Family Medicine

## 2011-06-30 ENCOUNTER — Telehealth: Payer: Self-pay

## 2011-06-30 ENCOUNTER — Telehealth: Payer: Self-pay | Admitting: Family Medicine

## 2011-06-30 NOTE — Telephone Encounter (Signed)
Shirlee Limerick: what are the key things I need to put in this letter or do i need a face to face to get an electric bed?

## 2011-06-30 NOTE — Telephone Encounter (Signed)
Med for sciatic nerve; rt hip pain is not helping. Pt thinks problem with bed; mattress has sunken area in middle of bed and the hydrolic cylinder in the bed leaks. Pt said eligible for new hospital bed but needs letter from dr. Donnella Sham why pt needs electric bed. Please advise.

## 2011-06-30 NOTE — Telephone Encounter (Signed)
Prior auth needed for diazepam. PA form on Dr [employer].

## 2011-07-15 NOTE — Telephone Encounter (Signed)
Herbert Seta do you know status of letter?

## 2011-07-15 NOTE — Telephone Encounter (Signed)
Herbert Seta do you know status of PA form?

## 2011-07-15 NOTE — Telephone Encounter (Signed)
Have not seen it.

## 2011-07-20 NOTE — Telephone Encounter (Signed)
Copy of PA form for diazepam in Dr Daphine Deutscher in box.

## 2011-07-22 ENCOUNTER — Other Ambulatory Visit: Payer: Self-pay | Admitting: Family Medicine

## 2011-07-22 NOTE — Telephone Encounter (Signed)
OK to refill? Last filled 05-20-11

## 2011-07-22 NOTE — Telephone Encounter (Signed)
Rx called in as directed.   

## 2011-08-17 NOTE — Telephone Encounter (Signed)
Can this encounter be closed?

## 2011-08-19 ENCOUNTER — Other Ambulatory Visit: Payer: Self-pay | Admitting: Family Medicine

## 2011-08-22 NOTE — Telephone Encounter (Signed)
Received refill request electronically. Last office visit 06/17/11. Is it okay to refill medication? 

## 2011-08-23 NOTE — Telephone Encounter (Signed)
[facility] called to pharmacy.

## 2011-08-24 NOTE — Telephone Encounter (Signed)
Karl Pock called for status of PA for diazepam; [facility] [address] said has not received back. Herbert Seta will check with Dr Ermalene Searing. Chelsea request call back [phone].

## 2011-08-25 NOTE — Telephone Encounter (Signed)
Do you remember doing this ?

## 2011-08-25 NOTE — Telephone Encounter (Signed)
I feel like I did do it but a long time ago.

## 2011-09-02 NOTE — Telephone Encounter (Signed)
Prior auth form in Dr [employer] in box.

## 2011-09-02 NOTE — Telephone Encounter (Signed)
Travis Cantrell can you rerequest form please!

## 2011-09-08 NOTE — Telephone Encounter (Signed)
Prior authorization approved for Diazepam.Letter in Dr Daphine Deutscher in box for signature and scanning.

## 2011-09-11 ENCOUNTER — Other Ambulatory Visit: Payer: Self-pay | Admitting: Family Medicine

## 2011-09-19 ENCOUNTER — Other Ambulatory Visit: Payer: Self-pay | Admitting: Family Medicine

## 2011-09-20 ENCOUNTER — Other Ambulatory Visit: Payer: Self-pay | Admitting: Family Medicine

## 2011-09-20 NOTE — Telephone Encounter (Signed)
[facility] 

## 2011-09-20 NOTE — Telephone Encounter (Signed)
Last office visit 06/17/11. Is it okay to refill medication?

## 2011-10-18 ENCOUNTER — Other Ambulatory Visit: Payer: Self-pay | Admitting: Family Medicine

## 2011-10-18 NOTE — Telephone Encounter (Signed)
Electronic refill request

## 2011-10-20 NOTE — Telephone Encounter (Signed)
Diazepam 5 mg 0 R called in to [facility] pharmacy.

## 2011-11-07 ENCOUNTER — Other Ambulatory Visit: Payer: Self-pay | Admitting: Family Medicine

## 2011-11-16 ENCOUNTER — Other Ambulatory Visit: Payer: Self-pay | Admitting: Family Medicine

## 2011-11-16 NOTE — Telephone Encounter (Signed)
Received refill request electronically. Last office visit 06/17/11. Is it okay to refill medication?

## 2011-12-06 ENCOUNTER — Telehealth: Payer: Self-pay

## 2011-12-06 NOTE — Telephone Encounter (Signed)
They just need rx

## 2011-12-06 NOTE — Telephone Encounter (Signed)
I spoke with patient and scheduled appointment on 12/09/11.

## 2011-12-06 NOTE — Telephone Encounter (Signed)
Patient needs face to face per [facility]

## 2011-12-06 NOTE — Telephone Encounter (Signed)
Marion/Heather.. How do I go about getting this pt a new hospital bed.. Just a prescription or a specific protocol.

## 2011-12-06 NOTE — Telephone Encounter (Signed)
Let pt know and schedule.

## 2011-12-06 NOTE — Telephone Encounter (Signed)
Travis Cantrell can you schedule this?

## 2011-12-06 NOTE — Telephone Encounter (Signed)
Lady left v/m;pts hospital bed is broken;pt has MS;needs rx for new hospital bed. Please advise.

## 2011-12-09 ENCOUNTER — Ambulatory Visit: Payer: Medicare Other | Admitting: Family Medicine

## 2011-12-09 DIAGNOSIS — Z0289 Encounter for other administrative examinations: Secondary | ICD-10-CM

## 2011-12-16 ENCOUNTER — Encounter: Payer: Self-pay | Admitting: Family Medicine

## 2011-12-16 ENCOUNTER — Ambulatory Visit (INDEPENDENT_AMBULATORY_CARE_PROVIDER_SITE_OTHER): Payer: Medicare Other | Admitting: Family Medicine

## 2011-12-16 VITALS — BP 130/72 | HR 101 | Temp 98.6°F

## 2011-12-16 DIAGNOSIS — IMO0001 Reserved for inherently not codable concepts without codable children: Secondary | ICD-10-CM

## 2011-12-16 DIAGNOSIS — M7918 Myalgia, other site: Secondary | ICD-10-CM

## 2011-12-16 DIAGNOSIS — L89309 Pressure ulcer of unspecified buttock, unspecified stage: Secondary | ICD-10-CM

## 2011-12-16 DIAGNOSIS — G35 Multiple sclerosis: Secondary | ICD-10-CM

## 2011-12-16 DIAGNOSIS — M79601 Pain in right arm: Secondary | ICD-10-CM

## 2011-12-16 DIAGNOSIS — M79609 Pain in unspecified limb: Secondary | ICD-10-CM

## 2011-12-16 NOTE — Assessment & Plan Note (Signed)
Paperwork completed for semi electric bed... Given pt has a medical condition that requires positioning of the bed in ways not feasible in an ordinary bed and requires position of the body in ways not feasible in an ordinary bed to alleviate pain and to avoid decubitus ulcers. He requires a bed with a height different that a fixed height hospital bed to permit transfers to chair or wheelchair. He requires frequent changes of body position to avoid decubitus ulcers and pain.

## 2011-12-16 NOTE — Progress Notes (Signed)
  Subjective:    Patient ID: Travis Cantrell, male    DOB: [birth year], 51 y.o.   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  He is wheelchair bound and nonambulatory. He has 0/5 strenght in B Lext and 2-3/5 strenght in upper extremities.. Most strength in elft arm. Contractures in right arm. He requires frequent repositioning not feasible in ordinary bed. He needs a bed that adjusts in height to assist with transfers from bed to wheelchair. He has pain in body and  arms that requires frequent repositioning to alleviate pain. He has had several decubitus ulcers on heels and buttocks in past, none currently as currently using hospital bed.  He does require side rails to avoid falling out of bed given muscle weakness.  No SOB, or history of aspiration.      Review of Systems  Constitutional: Positive for fatigue. Negative for fever.  Respiratory: Negative for shortness of breath.   Cardiovascular: Negative for chest pain.  Gastrointestinal: Negative for abdominal pain.  Skin: Negative for rash.       Objective:   Physical Exam  Constitutional: He is oriented to person, place, and time.       Wheel chair bound  HENT:  Head: Normocephalic and atraumatic.  Right Ear: External ear normal.  Left Ear: External ear normal.  Nose: Nose normal.  Mouth/Throat: Oropharynx is clear and moist.  Eyes: Conjunctivae normal and EOM are normal. Pupils are equal, round, and reactive to light.  Neck: Normal range of motion. Neck supple. No thyromegaly present.  Cardiovascular: Normal rate, regular rhythm, normal heart sounds and intact distal pulses.  Exam reveals no gallop and no friction rub.   No murmur heard. Pulmonary/Chest: Effort normal and breath sounds normal. No respiratory distress. He has no wheezes. He has no rales. He exhibits no tenderness.    Abdominal: Soft.  Neurological: He is alert and oriented to person, place, and time. He displays atrophy. He exhibits abnormal muscle tone.       He has 0/5 strenght in B Lext and 2-3/5 strength in upper extremities.. Most strength in left arm. Contractures in right arm that cause pain.  Skin: Skin is warm.        Scabbing at left anterior leg and left heel...  no surrounding erythema, no warmth, no discharge.          Assessment & Plan:

## 2011-12-29 ENCOUNTER — Other Ambulatory Visit: Payer: Self-pay | Admitting: Family Medicine

## 2012-01-26 ENCOUNTER — Telehealth: Payer: Self-pay

## 2012-01-26 NOTE — Telephone Encounter (Signed)
Left v/m that the company the hospital bed order was sent to is not in network; request call back.

## 2012-02-01 NOTE — Telephone Encounter (Signed)
Spoke with patient and he is going to call insurance and find out who to send information to.  Also will need a new rx for hospital bed also

## 2012-02-03 NOTE — Telephone Encounter (Signed)
Please find out exactly what hospital bed prescription needs to say and complete it and I will sign.

## 2012-02-06 NOTE — Telephone Encounter (Signed)
i dont have rx pad   Just needs rx with diagnoses codes on it and I will fax it to  [facility]   [address] [place]

## 2012-02-07 NOTE — Telephone Encounter (Signed)
Rx oin outbox.

## 2012-02-07 NOTE — Telephone Encounter (Signed)
Semi electric fixed height for wheel chair transfer

## 2012-02-07 NOTE — Telephone Encounter (Signed)
Yes but what kind of hospital bed ( there are like 4 kinds) Can you look back and see what I wrote last time? Should be on past paperwork.

## 2012-02-08 NOTE — Telephone Encounter (Signed)
rx faxed to medical equipment store

## 2012-02-17 ENCOUNTER — Other Ambulatory Visit: Payer: Self-pay | Admitting: Family Medicine

## 2012-03-05 NOTE — Telephone Encounter (Signed)
Charlene with [facility] medical request demographics and office note faxed to [phone]. Herbert Seta will refax.

## 2012-03-05 NOTE — Telephone Encounter (Signed)
Spoke with patient and he will contact medical supply company

## 2012-03-05 NOTE — Telephone Encounter (Signed)
Pt left v/m pt request status of request to get intouch with medical supply; still having problems with hospital bed.Please advise.

## 2012-03-13 NOTE — Telephone Encounter (Addendum)
Pt called for update on hospital bed; pt said [facility] does not accept insurance. Please advise.pt request call back.

## 2012-03-13 NOTE — Telephone Encounter (Signed)
Can you write new rx so i can fax to new medical supply company

## 2012-03-13 NOTE — Telephone Encounter (Signed)
In outbox

## 2012-03-13 NOTE — Telephone Encounter (Signed)
rx and documentation faxed to [facility] [place]

## 2012-03-21 ENCOUNTER — Other Ambulatory Visit: Payer: Self-pay | Admitting: Family Medicine

## 2012-05-01 ENCOUNTER — Other Ambulatory Visit: Payer: Self-pay | Admitting: Family Medicine

## 2012-05-01 MED ORDER — DIAZEPAM 5 MG PO TABS: 5.0000 mg | ORAL_TABLET | Freq: Two times a day (BID) | ORAL | Status: DC | PRN

## 2012-05-01 NOTE — Telephone Encounter (Signed)
He is already on  prozac 60 mg daily ( max dose). He is also already taking valium.. Please call to find out how often he is using valium for muscle spasms... We can have him increase this likely.. Let me know.

## 2012-05-01 NOTE — Telephone Encounter (Signed)
Patient Information:  [name] Name: Diane  Phone: [phone]  Patient: Travis Cantrell, Travis Cantrell  Gender: Male  DOB: Jul 24, 1960  Age: 52 Years  PCP: Kerby Nora (Family Practice)  Office Follow Up:  Does the office need to follow up with this patient?: Yes  Instructions For The Office: Wants Rx called in krs/can  RN Note:  Brother has passed away, and family is requesting a mild sedative or "something to get him through the funeral."  States is crying but acting as they would expect after a loss such as this.  Denies thoughts of suicide.  Per depressio protocol, emergent symptoms denied; info to office for provider review/Rx/callback.  Uses [facility]  May reach family at [phone] or [phone].  krs/can  Symptoms  Reason For Call & Symptoms: patient lost a close relative who passed away 25-May-2012.  States "he is taking it very hard" and cannot sleep well.  Wants Rx to be called in.  Reviewed Health History In EMR: Yes  Reviewed Medications In EMR: Yes  Reviewed Allergies In EMR: Yes  Reviewed Surgeries / Procedures: Yes  Date of Onset of Symptoms: 05/01/2012  Guideline(s) Used:  Depression  Disposition Per Guideline:   Home Care  Reason For Disposition Reached:   Recent death of a loved one  Advice Given:  Reassurance:   People with depression do get through this -- even people who feel as badly as you feel now. You can be helped.  Encourage the caller to talk about his/her problems and feelings.  Offer hope.  Call Back If:  Sadness or depression symptoms persist more than 2 weeks  You want to talk with a counselor  You feel like harming yourself  You become worse.  Death of a Loved One  Sadness and depression symptoms are common and normal after the death of a loved one.  Encourage the caller to talk about his/her troubles.  Let the caller know that taking time to grieve is important, and it is part of the healing process.  Suggest: review a photo album or share your  favorite memories with a friend or family member.  Support of family and friends is important.  Patient Will Follow Care Advice:  YES

## 2012-05-01 NOTE — Telephone Encounter (Signed)
rx faxed to pharmacy

## 2012-05-01 NOTE — Telephone Encounter (Signed)
Patient hasnt been taken this at all

## 2012-05-01 NOTE — Addendum Note (Signed)
Addended by: Consuello Masse on: 05/01/2012 05:03 PM   Modules accepted: Orders

## 2012-05-07 ENCOUNTER — Other Ambulatory Visit: Payer: Self-pay | Admitting: Family Medicine

## 2012-05-11 ENCOUNTER — Other Ambulatory Visit: Payer: Self-pay | Admitting: Family Medicine

## 2012-05-22 ENCOUNTER — Encounter: Payer: Self-pay | Admitting: Family Medicine

## 2012-05-22 ENCOUNTER — Ambulatory Visit (INDEPENDENT_AMBULATORY_CARE_PROVIDER_SITE_OTHER): Payer: Medicare Other | Admitting: Family Medicine

## 2012-05-22 VITALS — BP 120/74 | HR 78 | Temp 98.9°F

## 2012-05-22 DIAGNOSIS — K5900 Constipation, unspecified: Secondary | ICD-10-CM

## 2012-05-22 DIAGNOSIS — G35 Multiple sclerosis: Secondary | ICD-10-CM

## 2012-05-22 DIAGNOSIS — R609 Edema, unspecified: Secondary | ICD-10-CM | POA: Insufficient documentation

## 2012-05-22 DIAGNOSIS — L89309 Pressure ulcer of unspecified buttock, unspecified stage: Secondary | ICD-10-CM

## 2012-05-22 DIAGNOSIS — R600 Localized edema: Secondary | ICD-10-CM | POA: Insufficient documentation

## 2012-05-22 NOTE — Progress Notes (Signed)
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  He currently uses an Runner, broadcasting/film/video chair for mobility.  He does well with it. He is willing and motivated to use it. He has no mental issues that would inhibit him from using a power wheel chair. He is wheelchair bound and nonambulatory. He is max assist. He cannot ambulated with a walker or cane. Transfers to bed with a hoist. He cannot use a scooter given he lacks postural stability.  He has 0/5 strenght in B Lext and 2-3/5 strenght in upper extremities.. Most strength in left arm. Contractures in right arm.  He requires frequent repositioning not feasible in ordinary wheelchair. He needs awheelchair that adjusts to assist with transfers from bed to wheelchair.  He has pain in body and arms that requires frequent repositioning to alleviate pain.  He has had several decubitus ulcers on heels and buttocks in past.  He does require any device that would be able  avoid falling out the wheelchair given muscle weakness.  After he is up in chair for a while if he leans to one side he cannot straighten up.  He wears a strap across chest to hold him up in wheelchair, uses strap across to hold legs together to keep from banging them  He has limitations in all ADLS.Marland Kitchen Dressing, grooming bathing, toileting.Marland Kitchen Has a caregiver that has to assist him.   No SOB, or history of aspiration.   Constipation in last few weeks.  BMs one time a week... Feels full in abdomen. Using stool softner, with minimal relief. No blood in stool. Small BM this AM.  Has BM on bed.. Lies on his side.  Review of Systems  Constitutional: Positive for fatigue. Negative for fever.  Respiratory: Negative for shortness of breath.  Cardiovascular: Negative for chest pain.  Gastrointestinal: Negative for abdominal pain.  Skin: Negative for rash.    Objective:     Physical Exam  Constitutional: He is oriented to person, place, and time.  Wheel chair bound, nonambulatory MAx assist, cannot walk with wheelchair or walker. HENT:  Head: Normocephalic and atraumatic.  Right Ear: External ear normal.  Left Ear: External ear normal.  Nose: Nose normal.  Mouth/Throat: Oropharynx is clear and moist.  Eyes: Conjunctivae normal and EOM are normal. Pupils are equal, round, and reactive to light.  Neck: Normal range of motion. Neck supple. No thyromegaly present.  Cardiovascular: Normal rate, regular rhythm, normal heart sounds and intact distal pulses. Exam reveals no gallop and no friction rub.  No murmur heard.  EDEMA in B ankles. Pulmonary/Chest: Effort normal and breath sounds normal. No respiratory distress. He has no wheezes. He has no rales. He exhibits no tenderness.  Abdominal: Soft.  Neurological: He is alert and oriented to person, place, and time. He displays atrophy. He exhibits abnormal muscle tone.  He has 0/5 strenght in B Lext and 2-3/5 strength in upper extremities.. Most strength in left arm. Contractures in right arm that cause pain.  Skin: Skin is warm.   Scabbing at left anterior leg and left heel... no surrounding erythema, no warmth, no discharge.   Assessment & Plan:     Multiple Sclerosis primary progressive,   Paperwork completed for  an Mining engineer wheelchair... Given pt has a medical condition that requires positioning of the body in ways not feasible in an ordinary wheelchair  to alleviate  pain and to avoid decubitus ulcers.  He has contractures in his arms that make it impossible for him to propel a basic non electric wheelchair He uses a hoist to transfer to bed. He requires frequent changes of body position to avoid decubitus ulcers and pain.  he cannot use a scooter given he lacks postural stability.

## 2012-05-22 NOTE — Patient Instructions (Addendum)
Wear compression hose for edema.  For constipation:  Add miralax daily. If not BM in 24 hours. Start Fleets enema. If still no BM add  Milk of magnesia daily. Push water, fluids. Increase fiber slowly in your diet.. Fruits and veggies.

## 2012-05-22 NOTE — Assessment & Plan Note (Signed)
Resolving

## 2012-05-22 NOTE — Assessment & Plan Note (Signed)
Treat with mirilax, fleets enemaIncrease fiber and water. If still no BM... Add milk of MAg.

## 2012-05-22 NOTE — Assessment & Plan Note (Signed)
Elevate feet, start wearing compression hose.

## 2012-06-06 ENCOUNTER — Other Ambulatory Visit: Payer: Self-pay | Admitting: Family Medicine

## 2012-06-07 ENCOUNTER — Telehealth: Payer: Self-pay

## 2012-06-07 NOTE — Telephone Encounter (Addendum)
Travis Cantrell with [employer] left v/m; faxed detailed product description form for signature on 06/06/12 for power chair;there was some incorrect pricing for the insurance for the accessories and that info was updated. Travis Cantrell request call back if product description form received use ref # [id number] and if received please sign and fax back to [phone].

## 2012-06-08 NOTE — Telephone Encounter (Signed)
Travis Cantrell with [employer] request call back re: product description form.Please advise.

## 2012-06-21 ENCOUNTER — Other Ambulatory Visit: Payer: Self-pay | Admitting: Family Medicine

## 2012-06-23 ENCOUNTER — Other Ambulatory Visit: Payer: Self-pay | Admitting: Family Medicine

## 2012-06-26 NOTE — Telephone Encounter (Signed)
pts sister request status of gabapentin refill; [facility] christ[facility] at [facility] ready for pick up.

## 2012-07-11 ENCOUNTER — Other Ambulatory Visit: Payer: Self-pay | Admitting: Family Medicine

## 2012-08-02 ENCOUNTER — Other Ambulatory Visit: Payer: Self-pay | Admitting: Family Medicine

## 2012-08-03 ENCOUNTER — Other Ambulatory Visit: Payer: Self-pay | Admitting: Family Medicine

## 2012-08-13 ENCOUNTER — Telehealth: Payer: Self-pay

## 2012-08-13 NOTE — Telephone Encounter (Signed)
Pt said he does not have sensation to have BM but when takes Omeprazole 20 mg twice a day he develops sensation to have BM. Pt request new rx sent to [facility] [address] with instructions Omeprazole 20 mg twice a day. Pt said he has occasion indigestion, no N or V and no abd pain. Pt takes OTC stool softener (not sure of name or mg) one tab twice a day.Please advise.

## 2012-08-14 ENCOUNTER — Other Ambulatory Visit: Payer: Self-pay | Admitting: Family Medicine

## 2012-08-14 MED ORDER — OMEPRAZOLE 20 MG PO CPDR: 20.0000 mg | DELAYED_RELEASE_CAPSULE | Freq: Two times a day (BID) | ORAL | Status: DC

## 2012-08-15 ENCOUNTER — Other Ambulatory Visit: Payer: Self-pay | Admitting: Family Medicine

## 2012-08-15 NOTE — Telephone Encounter (Signed)
Ok refill? 

## 2012-08-16 ENCOUNTER — Other Ambulatory Visit: Payer: Self-pay | Admitting: Family Medicine

## 2012-08-16 NOTE — Telephone Encounter (Signed)
Electronic refill request.  Please advise. 

## 2012-08-16 NOTE — Telephone Encounter (Signed)
rx called to pharmacy 

## 2012-08-17 NOTE — Telephone Encounter (Signed)
I called this in yesterday 

## 2012-10-16 ENCOUNTER — Other Ambulatory Visit: Payer: Self-pay | Admitting: Family Medicine

## 2012-10-16 NOTE — Telephone Encounter (Signed)
Pt request status of ibuprofen refill; advised pt refill sent to [facility] [address]. Pt will ck with pharmacy.

## 2012-10-16 NOTE — Telephone Encounter (Signed)
Last office visit 05/22/2012.  Ok to refill? 

## 2012-11-07 ENCOUNTER — Other Ambulatory Visit: Payer: Self-pay | Admitting: Family Medicine

## 2012-11-08 NOTE — Telephone Encounter (Signed)
Last office visit 05/22/2012.  Ok to refill? 

## 2012-11-12 NOTE — Telephone Encounter (Signed)
Called to [facility] [address] 

## 2012-11-12 NOTE — Telephone Encounter (Signed)
Called to [facility]

## 2012-11-30 ENCOUNTER — Other Ambulatory Visit: Payer: Self-pay | Admitting: Family Medicine

## 2012-12-02 NOTE — Telephone Encounter (Signed)
Last office visit 05/22/2012.  Ok to refill?

## 2012-12-04 NOTE — Telephone Encounter (Signed)
Called to [facility] [address] 

## 2013-01-21 ENCOUNTER — Other Ambulatory Visit: Payer: Self-pay | Admitting: *Deleted

## 2013-01-21 MED ORDER — IBUPROFEN 800 MG PO TABS: ORAL_TABLET | ORAL | Status: DC

## 2013-01-21 NOTE — Telephone Encounter (Signed)
Last office visit 05/22/2012.  Ok to refill? 

## 2013-01-25 ENCOUNTER — Other Ambulatory Visit: Payer: Self-pay | Admitting: *Deleted

## 2013-01-25 MED ORDER — DIAZEPAM 5 MG PO TABS: ORAL_TABLET | ORAL | Status: DC

## 2013-01-25 NOTE — Telephone Encounter (Signed)
Rx called in as directed.   

## 2013-01-25 NOTE — Telephone Encounter (Signed)
Ok to refill 

## 2013-03-28 ENCOUNTER — Other Ambulatory Visit: Payer: Self-pay | Admitting: Family Medicine

## 2013-03-28 NOTE — Telephone Encounter (Signed)
Last office visit 05/12/2012.  Ok to refill?

## 2013-04-04 ENCOUNTER — Other Ambulatory Visit: Payer: Self-pay | Admitting: *Deleted

## 2013-04-04 MED ORDER — DIAZEPAM 5 MG PO TABS: ORAL_TABLET | ORAL | Status: DC

## 2013-04-04 MED ORDER — IBUPROFEN 800 MG PO TABS: ORAL_TABLET | ORAL | Status: DC

## 2013-04-04 NOTE — Telephone Encounter (Signed)
Last office visit 05/22/2012.  Ok to refill?

## 2013-04-04 NOTE — Telephone Encounter (Signed)
Diazepam called to [facility] [address] 

## 2013-04-05 ENCOUNTER — Emergency Department: Payer: Self-pay | Admitting: Emergency Medicine

## 2013-04-05 LAB — BASIC METABOLIC PANEL
Anion Gap: 3 — ABNORMAL LOW (ref 7–16)
BUN: 21 mg/dL — AB (ref 7–18)
CALCIUM: 9 mg/dL (ref 8.5–10.1)
Chloride: 104 mmol/L (ref 98–107)
Co2: 29 mmol/L (ref 21–32)
Creatinine: 0.87 mg/dL (ref 0.60–1.30)
EGFR (African American): >60
EGFR (Non-African Amer.): >60
GLUCOSE: 120 mg/dL — AB (ref 65–99)
Osmolality: 276 (ref 275–301)
POTASSIUM: 4.3 mmol/L (ref 3.5–5.1)
Sodium: 136 mmol/L (ref 136–145)

## 2013-04-05 LAB — ALBUMIN: ALBUMIN: 3.2 g/dL — AB (ref 3.4–5.0)

## 2013-04-06 LAB — URINE CULTURE

## 2013-05-20 ENCOUNTER — Other Ambulatory Visit: Payer: Self-pay | Admitting: Family Medicine

## 2013-05-21 NOTE — Telephone Encounter (Signed)
Diazepam called to [facility] [address]

## 2013-05-21 NOTE — Telephone Encounter (Signed)
Last office visit 05/22/2012.  No future appointments scheduled.  Ok to refill?

## 2013-05-24 ENCOUNTER — Ambulatory Visit (INDEPENDENT_AMBULATORY_CARE_PROVIDER_SITE_OTHER): Payer: Medicare Other | Admitting: Family Medicine

## 2013-05-24 ENCOUNTER — Encounter: Payer: Self-pay | Admitting: Family Medicine

## 2013-05-24 VITALS — BP 178/110 | HR 84 | Temp 98.0°F

## 2013-05-24 DIAGNOSIS — B351 Tinea unguium: Secondary | ICD-10-CM

## 2013-05-24 MED ORDER — AMOXICILLIN-POT CLAVULANATE 875-125 MG PO TABS: 1.0000 | ORAL_TABLET | Freq: Two times a day (BID) | ORAL | Status: DC

## 2013-05-24 MED ORDER — DIAZEPAM 5 MG PO TABS: ORAL_TABLET | ORAL | Status: DC

## 2013-05-24 MED ORDER — IBUPROFEN 800 MG PO TABS: ORAL_TABLET | ORAL | Status: DC

## 2013-05-24 NOTE — Patient Instructions (Addendum)
Stop any decongestant OTC.  Get BP cuff at home, follow BP at home, goal < 140/90. Call if 3 or more measurments above this. Start and complete Augmentin for tooth abcess. Follow up with dentist next week.  Stopa t front desk for podiatry referral.

## 2013-05-24 NOTE — Progress Notes (Signed)
Pre visit review using our clinic review tool, if applicable. No additional management support is needed unless otherwise documented below in the visit note. 

## 2013-05-24 NOTE — Progress Notes (Signed)
   Subjective:    Patient ID: Travis Cantrell, male    DOB: [birth year], 53 y.o.   MRN: [id number]  Sinusitis This is a new problem. The current episode started 1 to 4 weeks ago. The problem has been gradually worsening since onset. There has been no fever. His pain is at a severity of 10/10. Associated symptoms include coughing and sinus pressure. Pertinent negatives include no chills, congestion, ear pain, shortness of breath, sneezing, sore throat or swollen glands. (Left sinus pain and pressure, left gums hurting   Was on antibitoics  1-2 weeks ago for tooth infection on left top.Denist removed popcorn kernle.  Mild productive cough in morning) Past treatments include oral decongestants (sinus med OTC.). The treatment provided no relief.   Previously  BP well controlled but today very high. BP Readings from Last 3 Encounters:  05/24/13 178/110  05/22/12 120/74  12/16/11 130/72    Toenail fungus: request using lamisil.  Very deformed toenails, thickened.  Review of Systems  Constitutional: Negative for chills.  HENT: Positive for sinus pressure. Negative for congestion, ear pain, sneezing and sore throat.   Respiratory: Positive for cough. Negative for shortness of breath.        Objective:   Physical Exam  Constitutional: He is oriented to person, place, and time.  Wheel chair bound  HENT:  Head: Normocephalic and atraumatic.  Right Ear: External ear normal. No middle ear effusion.  Left Ear: External ear normal.  No middle ear effusion.  Nose: Nose normal. No mucosal edema or sinus tenderness. Right sinus exhibits no maxillary sinus tenderness. Left sinus exhibits no maxillary sinus tenderness.  Mouth/Throat: Oropharynx is clear and moist. Abnormal dentition. Dental abscesses and dental caries present. No posterior oropharyngeal edema or posterior oropharyngeal erythema.  Extremely painful, red in left upper tooth  Eyes: Conjunctivae and EOM are normal. Pupils are equal, round,  and reactive to light.  Neck: Normal range of motion. Neck supple. No thyromegaly present.  Cardiovascular: Normal rate, regular rhythm, normal heart sounds and intact distal pulses.  Exam reveals no gallop and no friction rub.   No murmur heard. Pulmonary/Chest: Effort normal and breath sounds normal. No respiratory distress. He has no wheezes. He has no rales. He exhibits no tenderness.  Abdominal: Soft.  Neurological: He is alert and oriented to person, place, and time. He displays atrophy. He exhibits abnormal muscle tone.  He has 0/5 strenght in B Lext and 2-3/5 strength in upper extremities.. Most strength in left arm. Contractures in right arm that cause pain.  Skin: Skin is warm.  B toenails thickened deformed and discolored.          Assessment & Plan:

## 2013-05-24 NOTE — Assessment & Plan Note (Signed)
As well as deformity. Likely not going to be responsive to lamisil.Marland Kitchen Refer to podiatry.

## 2013-06-11 ENCOUNTER — Encounter: Payer: Self-pay | Admitting: Podiatry

## 2013-06-11 ENCOUNTER — Ambulatory Visit (INDEPENDENT_AMBULATORY_CARE_PROVIDER_SITE_OTHER): Payer: Medicare Other | Admitting: Podiatry

## 2013-06-11 VITALS — BP 159/120 | HR 106 | Resp 16 | Ht 70.0 in | Wt 250.0 lb

## 2013-06-11 DIAGNOSIS — L600 Ingrowing nail: Secondary | ICD-10-CM

## 2013-06-11 DIAGNOSIS — M79609 Pain in unspecified limb: Secondary | ICD-10-CM

## 2013-06-11 DIAGNOSIS — B351 Tinea unguium: Secondary | ICD-10-CM

## 2013-06-11 NOTE — Progress Notes (Signed)
Subjective:     Patient ID: Travis Cantrell, male   DOB: Aug 29, 1960, 53 y.o.   MRN: [id number]  HPI patient presents in wheelchair with long history of MS stating I have very thick bad toenails that I can not cut myself and the loose they fall off they bleed and I want them cut by someone else   Review of Systems  All other systems reviewed and are negative.      Objective:   Physical Exam  Nursing note and vitals reviewed. Constitutional: He is oriented to person, place, and time.  Cardiovascular: Intact distal pulses.   Musculoskeletal: Normal range of motion.  Neurological: He is oriented to person, place, and time.  Skin: Skin is dry.   patient has diminishment of the pulses they are intact and does have significant diminishment neurological on both feet. He has diminished muscle strength and range of motion secondary to long-term MS and is found to have very thickened damaged nailbeds 1-5 both feet that are impossible to cut due to their position     Assessment:     Chronic nail disease 1-5 both feet with thickness and pain with a patient who has no ability to cut them on his own    Plan:     H&P performed and debridement of nailbeds 1-5 both feet several of them came off during the debridement process and bled and we applied sterile dressings to this and I gave instructions on soaks. Patient be seen back 3 months earlier if necessary

## 2013-06-11 NOTE — Patient Instructions (Signed)

## 2013-06-11 NOTE — Progress Notes (Signed)
   Subjective:    Patient ID: Travis Cantrell, male    DOB: May 04, 1960, 53 y.o.   MRN: [id number]  HPI Comments: i have fungus toenails. They do not hurt. i have had fungus for a long time. My niece trims my toenails. i used some otc medicine on them.     Review of Systems  Skin:       Thick scars   Hematological:       Slow to heal       Objective:   Physical Exam        Assessment & Plan:

## 2013-06-27 ENCOUNTER — Other Ambulatory Visit: Payer: Self-pay | Admitting: Family Medicine

## 2013-06-27 NOTE — Telephone Encounter (Signed)
Last office visit 05/24/2013.  Ok to refill?

## 2013-07-08 ENCOUNTER — Inpatient Hospital Stay: Payer: Self-pay | Admitting: Internal Medicine

## 2013-07-08 LAB — URINALYSIS, COMPLETE
BILIRUBIN, UR: NEGATIVE
Glucose,UR: NEGATIVE mg/dL (ref 0–75)
NITRITE: NEGATIVE
Ph: 5 (ref 4.5–8.0)
Protein: 100
Specific Gravity: 1.012 (ref 1.003–1.030)
Squamous Epithelial: NONE SEEN
WBC UR: 3903 /HPF (ref 0–5)

## 2013-07-08 LAB — CBC WITH DIFFERENTIAL/PLATELET
Basophil #: 0.2 10*3/uL — ABNORMAL HIGH (ref 0.0–0.1)
Basophil #: 0 10*3/uL (ref 0.0–0.1)
Basophil %: 0.4 %
Basophil %: 0.1 %
EOS ABS: 0 10*3/uL (ref 0.0–0.7)
Eosinophil #: 0 10*3/uL (ref 0.0–0.7)
Eosinophil %: 0 %
Eosinophil %: 0 %
HCT: 60.7 % — AB (ref 40.0–52.0)
HCT: 55.4 % — ABNORMAL HIGH (ref 40.0–52.0)
HGB: 20.2 g/dL — AB (ref 13.0–18.0)
HGB: 18.3 g/dL — AB (ref 13.0–18.0)
LYMPHS ABS: 0.7 10*3/uL — AB (ref 1.0–3.6)
Lymphocyte #: 0.9 10*3/uL — ABNORMAL LOW (ref 1.0–3.6)
Lymphocyte %: 1.4 %
Lymphocyte %: 2.1 %
MCH: 28.8 pg (ref 26.0–34.0)
MCH: 28.6 pg (ref 26.0–34.0)
MCHC: 33.3 g/dL (ref 32.0–36.0)
MCHC: 33 g/dL (ref 32.0–36.0)
MCV: 86 fL (ref 80–100)
MCV: 87 fL (ref 80–100)
MONO ABS: 3.6 x10 3/mm — AB (ref 0.2–1.0)
MONOS PCT: 9.3 %
Monocyte #: 4.5 x10 3/mm — ABNORMAL HIGH (ref 0.2–1.0)
Monocyte %: 8.1 %
NEUTROS ABS: 39.3 10*3/uL — AB (ref 1.4–6.5)
Neutrophil #: 43.3 10*3/uL — ABNORMAL HIGH (ref 1.4–6.5)
Neutrophil %: 88.9 %
Neutrophil %: 89.7 %
Platelet: 410 10*3/uL (ref 150–440)
Platelet: 335 10*3/uL (ref 150–440)
RBC: 7.03 10*6/uL — AB (ref 4.40–5.90)
RBC: 6.4 10*6/uL — ABNORMAL HIGH (ref 4.40–5.90)
RDW: 13.8 % (ref 11.5–14.5)
RDW: 13.7 % (ref 11.5–14.5)
WBC: 48.6 10*3/uL — ABNORMAL HIGH (ref 3.8–10.6)
WBC: 43.3 10*3/uL — AB (ref 3.8–10.6)

## 2013-07-08 LAB — COMPREHENSIVE METABOLIC PANEL
ALBUMIN: 3.3 g/dL — AB (ref 3.4–5.0)
ALT: 29 U/L (ref 12–78)
ANION GAP: 14 (ref 7–16)
AST: 25 U/L (ref 15–37)
Alkaline Phosphatase: 103 U/L
BILIRUBIN TOTAL: 1.9 mg/dL — AB (ref 0.2–1.0)
BUN: 36 mg/dL — ABNORMAL HIGH (ref 7–18)
CHLORIDE: 100 mmol/L (ref 98–107)
CO2: 20 mmol/L — AB (ref 21–32)
Calcium, Total: 9.1 mg/dL (ref 8.5–10.1)
Creatinine: 2.66 mg/dL — ABNORMAL HIGH (ref 0.60–1.30)
EGFR (African American): 30 — ABNORMAL LOW
GFR CALC NON AF AMER: 26 — AB
Glucose: 211 mg/dL — ABNORMAL HIGH (ref 65–99)
Osmolality: 283 (ref 275–301)
POTASSIUM: 3.7 mmol/L (ref 3.5–5.1)
Sodium: 134 mmol/L — ABNORMAL LOW (ref 136–145)
Total Protein: 8.2 g/dL (ref 6.4–8.2)

## 2013-07-08 LAB — LIPASE, BLOOD: LIPASE: 45 U/L — AB (ref 73–393)

## 2013-07-09 LAB — BASIC METABOLIC PANEL
Anion Gap: 5 — ABNORMAL LOW (ref 7–16)
BUN: 36 mg/dL — AB (ref 7–18)
CHLORIDE: 107 mmol/L (ref 98–107)
Calcium, Total: 8.2 mg/dL — ABNORMAL LOW (ref 8.5–10.1)
Co2: 23 mmol/L (ref 21–32)
Creatinine: 1.79 mg/dL — ABNORMAL HIGH (ref 0.60–1.30)
GFR CALC AF AMER: 49 — AB
GFR CALC NON AF AMER: 42 — AB
Glucose: 155 mg/dL — ABNORMAL HIGH (ref 65–99)
Osmolality: 282 (ref 275–301)
POTASSIUM: 4.1 mmol/L (ref 3.5–5.1)
Sodium: 135 mmol/L — ABNORMAL LOW (ref 136–145)

## 2013-07-09 LAB — CBC WITH DIFFERENTIAL/PLATELET
BASOS PCT: 0.8 %
Basophil #: 0.3 10*3/uL — ABNORMAL HIGH (ref 0.0–0.1)
EOS ABS: 0 10*3/uL (ref 0.0–0.7)
EOS PCT: 0 %
HCT: 48.2 % (ref 40.0–52.0)
HGB: 15.6 g/dL (ref 13.0–18.0)
Lymphocyte #: 1.6 10*3/uL (ref 1.0–3.6)
Lymphocyte %: 4.7 %
MCH: 28.4 pg (ref 26.0–34.0)
MCHC: 32.4 g/dL (ref 32.0–36.0)
MCV: 88 fL (ref 80–100)
MONOS PCT: 8.9 %
Monocyte #: 3 x10 3/mm — ABNORMAL HIGH (ref 0.2–1.0)
NEUTROS ABS: 28.6 10*3/uL — AB (ref 1.4–6.5)
NEUTROS PCT: 85.6 %
PLATELETS: 239 10*3/uL (ref 150–440)
RBC: 5.51 10*6/uL (ref 4.40–5.90)
RDW: 13.8 % (ref 11.5–14.5)
WBC: 33.4 10*3/uL — ABNORMAL HIGH (ref 3.8–10.6)

## 2013-07-10 LAB — CBC WITH DIFFERENTIAL/PLATELET
Basophil #: 0 10*3/uL (ref 0.0–0.1)
Basophil %: 0.2 %
Eosinophil #: 0.1 10*3/uL (ref 0.0–0.7)
Eosinophil %: 0.6 %
HCT: 45.6 % (ref 40.0–52.0)
HGB: 14.7 g/dL (ref 13.0–18.0)
Lymphocyte #: 0.8 10*3/uL — ABNORMAL LOW (ref 1.0–3.6)
Lymphocyte %: 5 %
MCH: 28.2 pg (ref 26.0–34.0)
MCHC: 32.2 g/dL (ref 32.0–36.0)
MCV: 87 fL (ref 80–100)
Monocyte #: 1.4 x10 3/mm — ABNORMAL HIGH (ref 0.2–1.0)
Monocyte %: 8.5 %
Neutrophil #: 14.5 10*3/uL — ABNORMAL HIGH (ref 1.4–6.5)
Neutrophil %: 85.7 %
Platelet: 227 10*3/uL (ref 150–440)
RBC: 5.22 10*6/uL (ref 4.40–5.90)
RDW: 13.9 % (ref 11.5–14.5)
WBC: 16.9 10*3/uL — ABNORMAL HIGH (ref 3.8–10.6)

## 2013-07-10 LAB — BASIC METABOLIC PANEL
Anion Gap: 9 (ref 7–16)
BUN: 27 mg/dL — ABNORMAL HIGH (ref 7–18)
Calcium, Total: 8.2 mg/dL — ABNORMAL LOW (ref 8.5–10.1)
Chloride: 103 mmol/L (ref 98–107)
Co2: 27 mmol/L (ref 21–32)
Creatinine: 1.46 mg/dL — ABNORMAL HIGH (ref 0.60–1.30)
EGFR (African American): >60
EGFR (Non-African Amer.): 54 — ABNORMAL LOW
Glucose: 90 mg/dL (ref 65–99)
Osmolality: 282 (ref 275–301)
Potassium: 3.1 mmol/L — ABNORMAL LOW (ref 3.5–5.1)
Sodium: 139 mmol/L (ref 136–145)

## 2013-07-11 LAB — CBC WITH DIFFERENTIAL/PLATELET
Basophil #: 0.1 10*3/uL (ref 0.0–0.1)
Basophil %: 0.7 %
Eosinophil #: 0.3 10*3/uL (ref 0.0–0.7)
Eosinophil %: 3.2 %
HCT: 40.7 % (ref 40.0–52.0)
HGB: 13.5 g/dL (ref 13.0–18.0)
LYMPHS PCT: 10.5 %
Lymphocyte #: 1 10*3/uL (ref 1.0–3.6)
MCH: 28.7 pg (ref 26.0–34.0)
MCHC: 33.1 g/dL (ref 32.0–36.0)
MCV: 87 fL (ref 80–100)
MONO ABS: 0.8 x10 3/mm (ref 0.2–1.0)
MONOS PCT: 8.4 %
Neutrophil #: 7.3 10*3/uL — ABNORMAL HIGH (ref 1.4–6.5)
Neutrophil %: 77.2 %
Platelet: 211 10*3/uL (ref 150–440)
RBC: 4.69 10*6/uL (ref 4.40–5.90)
RDW: 13.5 % (ref 11.5–14.5)
WBC: 9.5 10*3/uL (ref 3.8–10.6)

## 2013-07-11 LAB — BASIC METABOLIC PANEL
ANION GAP: 7 (ref 7–16)
BUN: 22 mg/dL — AB (ref 7–18)
CREATININE: 1.05 mg/dL (ref 0.60–1.30)
Calcium, Total: 8.4 mg/dL — ABNORMAL LOW (ref 8.5–10.1)
Chloride: 105 mmol/L (ref 98–107)
Co2: 27 mmol/L (ref 21–32)
EGFR (African American): >60
Glucose: 96 mg/dL (ref 65–99)
Osmolality: 281 (ref 275–301)
POTASSIUM: 3.2 mmol/L — AB (ref 3.5–5.1)
Sodium: 139 mmol/L (ref 136–145)

## 2013-07-11 LAB — URINE CULTURE

## 2013-07-13 LAB — CULTURE, BLOOD (SINGLE)

## 2013-07-23 ENCOUNTER — Ambulatory Visit: Payer: Medicare Other | Admitting: Family Medicine

## 2013-07-25 ENCOUNTER — Ambulatory Visit: Payer: Medicare Other | Admitting: Family Medicine

## 2013-08-16 ENCOUNTER — Other Ambulatory Visit: Payer: Self-pay | Admitting: Family Medicine

## 2013-08-16 NOTE — Telephone Encounter (Addendum)
Last office visit 05/24/2013 for toenail fungus.   Prior to that last office visit was 05/22/2012.  Ok to refill his medications?

## 2013-08-18 NOTE — Telephone Encounter (Signed)
Needs CPX with labs prior, okay to refill until then.

## 2013-09-13 ENCOUNTER — Ambulatory Visit: Payer: Medicare Other | Admitting: Podiatry

## 2013-09-19 ENCOUNTER — Other Ambulatory Visit: Payer: Self-pay | Admitting: Family Medicine

## 2013-09-20 ENCOUNTER — Ambulatory Visit: Payer: Medicare Other | Admitting: Podiatry

## 2013-10-15 ENCOUNTER — Other Ambulatory Visit: Payer: Self-pay | Admitting: Family Medicine

## 2013-10-16 NOTE — Telephone Encounter (Signed)
Last office visit 05/24/2013.  Ok to refill?

## 2013-10-17 NOTE — Telephone Encounter (Signed)
Diazepam called to [facility] [address] 

## 2013-10-27 ENCOUNTER — Telehealth: Payer: Self-pay | Admitting: Family Medicine

## 2013-10-27 DIAGNOSIS — E7800 Pure hypercholesterolemia, unspecified: Secondary | ICD-10-CM

## 2013-10-27 DIAGNOSIS — R7303 Prediabetes: Secondary | ICD-10-CM

## 2013-10-27 NOTE — Telephone Encounter (Signed)
Message copied by Excell Seltzer on Sun Oct 27, 2013 11:34 PM ------      Message from: Alvina Chou      Created: Wed Oct 23, 2013  5:07 PM      Regarding: Lab orders for Monday, 10.5.15       Patient is scheduled for CPX labs, please order future labs, Thanks , Terri       ------

## 2013-10-28 ENCOUNTER — Other Ambulatory Visit: Payer: Medicare Other

## 2013-10-29 ENCOUNTER — Inpatient Hospital Stay: Payer: Self-pay | Admitting: Internal Medicine

## 2013-10-29 LAB — CBC
HCT: 58.1 % — ABNORMAL HIGH (ref 40.0–52.0)
HGB: 18.4 g/dL — ABNORMAL HIGH (ref 13.0–18.0)
MCH: 27.4 pg (ref 26.0–34.0)
MCHC: 31.7 g/dL — ABNORMAL LOW (ref 32.0–36.0)
MCV: 87 fL (ref 80–100)
Platelet: 475 10*3/uL — ABNORMAL HIGH (ref 150–440)
RBC: 6.72 10*6/uL — ABNORMAL HIGH (ref 4.40–5.90)
RDW: 13.8 % (ref 11.5–14.5)
WBC: 68.2 10*3/uL — ABNORMAL HIGH (ref 3.8–10.6)

## 2013-10-29 LAB — URINALYSIS, COMPLETE
Bilirubin,UR: NEGATIVE
Blood: NEGATIVE
GLUCOSE, UR: NEGATIVE mg/dL (ref 0–75)
Ketone: NEGATIVE
NITRITE: NEGATIVE
PH: 8 (ref 4.5–8.0)
Protein: 100
SPECIFIC GRAVITY: 1.016 (ref 1.003–1.030)
Squamous Epithelial: NONE SEEN
WBC UR: 16 /HPF (ref 0–5)

## 2013-10-29 LAB — COMPREHENSIVE METABOLIC PANEL
ALT: 25 U/L
Albumin: 3.1 g/dL — ABNORMAL LOW (ref 3.4–5.0)
Alkaline Phosphatase: 90 U/L
Anion Gap: 10 (ref 7–16)
BUN: 35 mg/dL — AB (ref 7–18)
Bilirubin,Total: 1.2 mg/dL — ABNORMAL HIGH (ref 0.2–1.0)
CHLORIDE: 106 mmol/L (ref 98–107)
Calcium, Total: 9 mg/dL (ref 8.5–10.1)
Co2: 20 mmol/L — ABNORMAL LOW (ref 21–32)
Creatinine: 2.98 mg/dL — ABNORMAL HIGH (ref 0.60–1.30)
EGFR (African American): 29 — ABNORMAL LOW
GFR CALC NON AF AMER: 24 — AB
Glucose: 249 mg/dL — ABNORMAL HIGH (ref 65–99)
Osmolality: 288 (ref 275–301)
POTASSIUM: 5.1 mmol/L (ref 3.5–5.1)
SGOT(AST): 17 U/L (ref 15–37)
SODIUM: 136 mmol/L (ref 136–145)
Total Protein: 7.5 g/dL (ref 6.4–8.2)

## 2013-10-29 LAB — CK TOTAL AND CKMB (NOT AT ARMC)
CK, TOTAL: 24 U/L — AB
CK-MB: <0.5 ng/mL — ABNORMAL LOW (ref 0.5–3.6)

## 2013-10-29 LAB — TROPONIN I

## 2013-10-30 LAB — CBC WITH DIFFERENTIAL/PLATELET
Basophil #: 0.1 10*3/uL (ref 0.0–0.1)
Basophil %: 0.1 %
EOS ABS: 0 10*3/uL (ref 0.0–0.7)
Eosinophil %: 0 %
HCT: 47.3 % (ref 40.0–52.0)
HGB: 15.1 g/dL (ref 13.0–18.0)
Lymphocyte #: 1.3 10*3/uL (ref 1.0–3.6)
Lymphocyte %: 2.7 %
MCH: 27.8 pg (ref 26.0–34.0)
MCHC: 31.9 g/dL — AB (ref 32.0–36.0)
MCV: 87 fL (ref 80–100)
MONO ABS: 2.8 x10 3/mm — AB (ref 0.2–1.0)
Monocyte %: 5.8 %
Neutrophil #: 45.1 10*3/uL — ABNORMAL HIGH (ref 1.4–6.5)
Neutrophil %: 91.4 %
PLATELETS: 311 10*3/uL (ref 150–440)
RBC: 5.42 10*6/uL (ref 4.40–5.90)
RDW: 14.1 % (ref 11.5–14.5)
WBC: 49.3 10*3/uL — AB (ref 3.8–10.6)

## 2013-10-30 LAB — BASIC METABOLIC PANEL
ANION GAP: 8 (ref 7–16)
BUN: 36 mg/dL — ABNORMAL HIGH (ref 7–18)
CHLORIDE: 114 mmol/L — AB (ref 98–107)
CO2: 21 mmol/L (ref 21–32)
CREATININE: 2.89 mg/dL — AB (ref 0.60–1.30)
Calcium, Total: 8.1 mg/dL — ABNORMAL LOW (ref 8.5–10.1)
EGFR (Non-African Amer.): 24 — ABNORMAL LOW
GFR CALC AF AMER: 30 — AB
Glucose: 175 mg/dL — ABNORMAL HIGH (ref 65–99)
Osmolality: 298 (ref 275–301)
Potassium: 4.4 mmol/L (ref 3.5–5.1)
SODIUM: 143 mmol/L (ref 136–145)

## 2013-10-31 ENCOUNTER — Encounter: Payer: Medicare Other | Admitting: Family Medicine

## 2013-10-31 DIAGNOSIS — Z0289 Encounter for other administrative examinations: Secondary | ICD-10-CM

## 2013-10-31 LAB — CBC WITH DIFFERENTIAL/PLATELET
Basophil #: 0.2 10*3/uL — ABNORMAL HIGH (ref 0.0–0.1)
Basophil %: 0.9 %
Eosinophil #: 0.1 10*3/uL (ref 0.0–0.7)
Eosinophil %: 0.3 %
HCT: 42.1 % (ref 40.0–52.0)
HGB: 13.3 g/dL (ref 13.0–18.0)
LYMPHS ABS: 1.8 10*3/uL (ref 1.0–3.6)
Lymphocyte %: 8.7 %
MCH: 28.1 pg (ref 26.0–34.0)
MCHC: 31.5 g/dL — ABNORMAL LOW (ref 32.0–36.0)
MCV: 89 fL (ref 80–100)
MONOS PCT: 4.7 %
Monocyte #: 1 x10 3/mm (ref 0.2–1.0)
Neutrophil #: 17.3 10*3/uL — ABNORMAL HIGH (ref 1.4–6.5)
Neutrophil %: 85.4 %
Platelet: 223 10*3/uL (ref 150–440)
RBC: 4.72 10*6/uL (ref 4.40–5.90)
RDW: 15 % — ABNORMAL HIGH (ref 11.5–14.5)
WBC: 20.3 10*3/uL — AB (ref 3.8–10.6)

## 2013-10-31 LAB — RENAL FUNCTION PANEL
Albumin: 2.1 g/dL — ABNORMAL LOW (ref 3.4–5.0)
Anion Gap: 5 — ABNORMAL LOW (ref 7–16)
BUN: 27 mg/dL — ABNORMAL HIGH (ref 7–18)
CHLORIDE: 110 mmol/L — AB (ref 98–107)
CO2: 27 mmol/L (ref 21–32)
Calcium, Total: 7.8 mg/dL — ABNORMAL LOW (ref 8.5–10.1)
Creatinine: 1.86 mg/dL — ABNORMAL HIGH (ref 0.60–1.30)
EGFR (African American): 49 — ABNORMAL LOW
EGFR (Non-African Amer.): 41 — ABNORMAL LOW
Glucose: 132 mg/dL — ABNORMAL HIGH (ref 65–99)
Osmolality: 290 (ref 275–301)
PHOSPHORUS: 2.5 mg/dL (ref 2.5–4.9)
Potassium: 4.4 mmol/L (ref 3.5–5.1)
Sodium: 142 mmol/L (ref 136–145)

## 2013-10-31 LAB — URINE CULTURE

## 2013-11-01 LAB — BASIC METABOLIC PANEL
ANION GAP: 8 (ref 7–16)
BUN: 17 mg/dL (ref 7–18)
CHLORIDE: 108 mmol/L — AB (ref 98–107)
Calcium, Total: 8.1 mg/dL — ABNORMAL LOW (ref 8.5–10.1)
Co2: 26 mmol/L (ref 21–32)
Creatinine: 1.11 mg/dL (ref 0.60–1.30)
Glucose: 135 mg/dL — ABNORMAL HIGH (ref 65–99)
OSMOLALITY: 287 (ref 275–301)
Potassium: 3.7 mmol/L (ref 3.5–5.1)
Sodium: 142 mmol/L (ref 136–145)

## 2013-11-01 LAB — CBC WITH DIFFERENTIAL/PLATELET
BASOS PCT: 0.7 %
Basophil #: 0.1 10*3/uL (ref 0.0–0.1)
EOS ABS: 0.2 10*3/uL (ref 0.0–0.7)
EOS PCT: 1.4 %
HCT: 39 % — AB (ref 40.0–52.0)
HGB: 12.6 g/dL — AB (ref 13.0–18.0)
Lymphocyte #: 1 10*3/uL (ref 1.0–3.6)
Lymphocyte %: 6.5 %
MCH: 28.1 pg (ref 26.0–34.0)
MCHC: 32.2 g/dL (ref 32.0–36.0)
MCV: 87 fL (ref 80–100)
Monocyte #: 0.9 x10 3/mm (ref 0.2–1.0)
Monocyte %: 5.5 %
NEUTROS PCT: 85.9 %
Neutrophil #: 13.5 10*3/uL — ABNORMAL HIGH (ref 1.4–6.5)
Platelet: 215 10*3/uL (ref 150–440)
RBC: 4.47 10*6/uL (ref 4.40–5.90)
RDW: 14.1 % (ref 11.5–14.5)
WBC: 15.8 10*3/uL — ABNORMAL HIGH (ref 3.8–10.6)

## 2013-11-02 LAB — CBC WITH DIFFERENTIAL/PLATELET
BASOS ABS: 0.1 10*3/uL (ref 0.0–0.1)
BASOS PCT: 0.7 %
EOS ABS: 0.3 10*3/uL (ref 0.0–0.7)
Eosinophil %: 2.2 %
HCT: 37.1 % — AB (ref 40.0–52.0)
HGB: 12 g/dL — AB (ref 13.0–18.0)
Lymphocyte #: 2 10*3/uL (ref 1.0–3.6)
Lymphocyte %: 17 %
MCH: 27.9 pg (ref 26.0–34.0)
MCHC: 32.3 g/dL (ref 32.0–36.0)
MCV: 86 fL (ref 80–100)
MONOS PCT: 8.6 %
Monocyte #: 1 x10 3/mm (ref 0.2–1.0)
Neutrophil #: 8.3 10*3/uL — ABNORMAL HIGH (ref 1.4–6.5)
Neutrophil %: 71.5 %
Platelet: 241 10*3/uL (ref 150–440)
RBC: 4.29 10*6/uL — ABNORMAL LOW (ref 4.40–5.90)
RDW: 13.7 % (ref 11.5–14.5)
WBC: 11.5 10*3/uL — ABNORMAL HIGH (ref 3.8–10.6)

## 2013-11-02 LAB — BASIC METABOLIC PANEL
ANION GAP: 8 (ref 7–16)
BUN: 16 mg/dL (ref 7–18)
CHLORIDE: 106 mmol/L (ref 98–107)
CO2: 28 mmol/L (ref 21–32)
Calcium, Total: 7.9 mg/dL — ABNORMAL LOW (ref 8.5–10.1)
Creatinine: 1.02 mg/dL (ref 0.60–1.30)
EGFR (Non-African Amer.): >60
GLUCOSE: 117 mg/dL — AB (ref 65–99)
OSMOLALITY: 285 (ref 275–301)
POTASSIUM: 3.5 mmol/L (ref 3.5–5.1)
Sodium: 142 mmol/L (ref 136–145)

## 2013-11-03 LAB — BASIC METABOLIC PANEL
ANION GAP: 6 — AB (ref 7–16)
BUN: 15 mg/dL (ref 7–18)
CREATININE: 1 mg/dL (ref 0.60–1.30)
Calcium, Total: 8.3 mg/dL — ABNORMAL LOW (ref 8.5–10.1)
Chloride: 106 mmol/L (ref 98–107)
Co2: 31 mmol/L (ref 21–32)
EGFR (African American): >60
EGFR (Non-African Amer.): >60
Glucose: 87 mg/dL (ref 65–99)
Osmolality: 285 (ref 275–301)
Potassium: 3.3 mmol/L — ABNORMAL LOW (ref 3.5–5.1)
SODIUM: 143 mmol/L (ref 136–145)

## 2013-11-03 LAB — CBC WITH DIFFERENTIAL/PLATELET
Basophil #: 0.1 10*3/uL (ref 0.0–0.1)
Basophil %: 0.7 %
EOS PCT: 4.2 %
Eosinophil #: 0.4 10*3/uL (ref 0.0–0.7)
HCT: 39.9 % — ABNORMAL LOW (ref 40.0–52.0)
HGB: 13.2 g/dL (ref 13.0–18.0)
Lymphocyte #: 1.7 10*3/uL (ref 1.0–3.6)
Lymphocyte %: 16.5 %
MCH: 28.5 pg (ref 26.0–34.0)
MCHC: 33.1 g/dL (ref 32.0–36.0)
MCV: 86 fL (ref 80–100)
MONO ABS: 1 x10 3/mm (ref 0.2–1.0)
MONOS PCT: 10.1 %
NEUTROS PCT: 68.5 %
Neutrophil #: 6.9 10*3/uL — ABNORMAL HIGH (ref 1.4–6.5)
Platelet: 251 10*3/uL (ref 150–440)
RBC: 4.62 10*6/uL (ref 4.40–5.90)
RDW: 13.6 % (ref 11.5–14.5)
WBC: 10.1 10*3/uL (ref 3.8–10.6)

## 2013-11-03 LAB — CULTURE, BLOOD (SINGLE)

## 2013-11-05 ENCOUNTER — Telehealth: Payer: Self-pay | Admitting: Family Medicine

## 2013-11-05 LAB — CULTURE, BLOOD (SINGLE)

## 2013-11-05 NOTE — Telephone Encounter (Signed)
Pt is calling requesting a home visit. I told him that you typically do not make house calls but I would see who you recommend. Pt states he has no one to bring him to a visit and his car is broke down. [facility] Pt stated white blood count was up and they put him on blood pressure medicine. [facility] wanted him to follow up in 5 days. Please advise.  Pt call back # [phone]

## 2013-11-05 NOTE — Telephone Encounter (Signed)
Is Dr. Alphonsus Sias still doing home visits? If not I do not know who else is. Pt needs to come in for OV. I believe there is some medical transport assistance available.  This would be great thing to get [facility] help with. Can we contact them ASAP? Does he have home health coming out? We can get this set up for him.

## 2013-11-06 NOTE — Telephone Encounter (Signed)
Dr. Alphonsus SiasLetvak unable to do home visit.  [facility] my [facility].

## 2013-11-06 NOTE — Telephone Encounter (Addendum)
Sp[facility] with [facility] at [facility]. Travis Cantrell does not meet the [facility] and states that since Travis Cantrell is not their patient they could not help me but he did give me a phone number [phone] of a transportation company they use.  Spoke with a lady there who gave me a fee of $40 for the first twenty miles that starts when they leave there office which is [facility] to [facility] and then $1 per mile after that.  Spoke with Travis Cantrell.  I ask if he had any family, friends or neighbors that could bring him in for an office visit and he states he does not.  I ask about Home Health and he states that they are suppose to be coming by but has not been by since he was discharged from the hospital.

## 2013-11-07 LAB — CULTURE, BLOOD (SINGLE)

## 2013-11-08 NOTE — Telephone Encounter (Signed)
Called pt to discuss [facility] and [employer] ride for non medicare pt  Under 59.  Pt agreeable to transportation.  Form completed please fax in ASAP.  Please make appt for pt  ASAP once ride is able to be set up. Hospital follow up.  Let me know if this doesn't work and I will set up a time for a house visit.

## 2013-11-13 NOTE — Telephone Encounter (Signed)
Spoke with Britta Mccreedy at [facility] and [employer].  Arrangements have been made to bring Mr. Souders to our office to see Dr. Ermalene Searing on 11/19/2013 @ 1:30pm for hospital follow up.  Mr. Grumbles will need to be ready between 11:40 am to 12:20 pm for pick up.  Mr. Cornforth notified.

## 2013-11-19 ENCOUNTER — Encounter: Payer: Self-pay | Admitting: *Deleted

## 2013-11-19 ENCOUNTER — Encounter: Payer: Self-pay | Admitting: Family Medicine

## 2013-11-19 ENCOUNTER — Ambulatory Visit (INDEPENDENT_AMBULATORY_CARE_PROVIDER_SITE_OTHER): Payer: Medicare Other | Admitting: Family Medicine

## 2013-11-19 VITALS — BP 119/84 | HR 65 | Temp 97.2°F

## 2013-11-19 DIAGNOSIS — J690 Pneumonitis due to inhalation of food and vomit: Secondary | ICD-10-CM | POA: Insufficient documentation

## 2013-11-19 DIAGNOSIS — L89301 Pressure ulcer of unspecified buttock, stage 1: Secondary | ICD-10-CM

## 2013-11-19 DIAGNOSIS — I10 Essential (primary) hypertension: Secondary | ICD-10-CM

## 2013-11-19 DIAGNOSIS — E7800 Pure hypercholesterolemia, unspecified: Secondary | ICD-10-CM

## 2013-11-19 DIAGNOSIS — A4159 Other Gram-negative sepsis: Secondary | ICD-10-CM | POA: Insufficient documentation

## 2013-11-19 DIAGNOSIS — T83511A Infection and inflammatory reaction due to indwelling urethral catheter, initial encounter: Secondary | ICD-10-CM

## 2013-11-19 DIAGNOSIS — J159 Unspecified bacterial pneumonia: Secondary | ICD-10-CM

## 2013-11-19 DIAGNOSIS — J189 Pneumonia, unspecified organism: Secondary | ICD-10-CM | POA: Insufficient documentation

## 2013-11-19 DIAGNOSIS — N390 Urinary tract infection, site not specified: Secondary | ICD-10-CM | POA: Insufficient documentation

## 2013-11-19 DIAGNOSIS — R7303 Prediabetes: Secondary | ICD-10-CM

## 2013-11-19 DIAGNOSIS — A414 Sepsis due to anaerobes: Secondary | ICD-10-CM | POA: Insufficient documentation

## 2013-11-19 DIAGNOSIS — R7309 Other abnormal glucose: Secondary | ICD-10-CM

## 2013-11-19 DIAGNOSIS — Z23 Encounter for immunization: Secondary | ICD-10-CM

## 2013-11-19 LAB — COMPREHENSIVE METABOLIC PANEL
ALT: 12 U/L (ref 0–53)
AST: 11 U/L (ref 0–37)
Albumin: 2.4 g/dL — ABNORMAL LOW (ref 3.5–5.2)
Alkaline Phosphatase: 66 U/L (ref 39–117)
BUN: 19 mg/dL (ref 6–23)
CALCIUM: 8.8 mg/dL (ref 8.4–10.5)
CHLORIDE: 100 meq/L (ref 96–112)
CO2: 22 meq/L (ref 19–32)
Creatinine, Ser: 1 mg/dL (ref 0.4–1.5)
GFR: 86.89 mL/min (ref >60.00)
GLUCOSE: 93 mg/dL (ref 70–99)
Potassium: 3.6 mEq/L (ref 3.5–5.1)
Sodium: 134 mEq/L — ABNORMAL LOW (ref 135–145)
Total Bilirubin: 0.7 mg/dL (ref 0.2–1.2)
Total Protein: 7.4 g/dL (ref 6.0–8.3)

## 2013-11-19 LAB — LIPID PANEL
Cholesterol: 237 mg/dL — ABNORMAL HIGH (ref 0–200)
HDL: 17.9 mg/dL — ABNORMAL LOW (ref >39.00)
LDL Cholesterol: 185 mg/dL — ABNORMAL HIGH (ref 0–99)
NonHDL: 219.1
Total CHOL/HDL Ratio: 13
Triglycerides: 171 mg/dL — ABNORMAL HIGH (ref 0.0–149.0)
VLDL: 34.2 mg/dL (ref 0.0–40.0)

## 2013-11-19 LAB — HEMOGLOBIN A1C: Hgb A1c MFr Bld: 6.1 % (ref 4.6–6.5)

## 2013-11-19 NOTE — Progress Notes (Signed)
Pre visit review using our clinic review tool, if applicable. No additional management support is needed unless otherwise documented below in the visit note. 

## 2013-11-19 NOTE — Assessment & Plan Note (Signed)
Resolved

## 2013-11-19 NOTE — Addendum Note (Signed)
Addended by: Damita Lack on: 11/19/2013 02:28 PM   Modules accepted: Orders, Medications

## 2013-11-19 NOTE — Addendum Note (Signed)
Addended by: Baldomero LamyHAVERS, NATASHA C on: 11/19/2013 03:01 PM   Modules accepted: Orders

## 2013-11-19 NOTE — Progress Notes (Signed)
   Subjective:    Patient ID: Travis Cantrell, male    DOB: 05/23/1960, 53 y.o.   MRN: [id number]  HPI   53 year old male with MS progressive wheelchair bound presents for [facility] hospital follow up. He was hospitalized on 10/6-10/11/ 2015 for Klebsiella sepsis secondary to UTI secondary to chronic foley. Initial blood cultures showed Klebsiella.  Also had CA PNA noted in right upper lobe.  Treated with IV levaquin. Wbc initially 49 down to 10 at discharge. Repeat blood culture neg.  ARF: resolved with hydration. Cr 2.83 down to 1.0 a discharge  He was found to be hypertensive. Started on toprol and lisinopril. He has run out of one, not sure which. HR is lower, so probably still taking the toprol. BP Readings from Last 3 Encounters:  11/19/13 119/84  06/11/13 159/120  05/24/13 178/110   He has been doing well since leaving the hospital. No CP , no shortness of breath. He has been drinking fluids and cranberry.  Have small decubitus on crack of buttock. Needs prescription for Tegaderm. He is having issues with neck and shoulders slumping forward.    Review of Systems  Constitutional: Positive for fatigue. Negative for fever.  HENT: Negative for ear pain.   Eyes: Negative for pain.  Respiratory: Negative for cough, shortness of breath and wheezing.   Cardiovascular: Negative for chest pain, palpitations and leg swelling.  Gastrointestinal: Negative for abdominal pain.       Objective:   Physical Exam  Constitutional: He is oriented to person, place, and time.  Wheel chair bound, slumped over forward and to left in chair. Difficulty keeping head up Unkempt  HENT:  Head: Normocephalic and atraumatic.  Right Ear: External ear normal. No middle ear effusion.  Left Ear: External ear normal.  No middle ear effusion.  Nose: Nose normal. No mucosal edema or sinus tenderness. Right sinus exhibits no maxillary sinus tenderness. Left sinus exhibits no maxillary sinus tenderness.    Mouth/Throat: Oropharynx is clear and moist. No posterior oropharyngeal edema or posterior oropharyngeal erythema.  Eyes: Conjunctivae and EOM are normal. Pupils are equal, round, and reactive to light.  Neck: Normal range of motion. Neck supple. No thyromegaly present.  Cardiovascular: Normal rate, regular rhythm, normal heart sounds and intact distal pulses.  Exam reveals no gallop and no friction rub.   No murmur heard. Pulmonary/Chest: Effort normal and breath sounds normal. No respiratory distress. He has no wheezes. He has no rales. He exhibits no tenderness.  Abdominal: Soft.  Neurological: He is alert and oriented to person, place, and time. He displays atrophy. He exhibits abnormal muscle tone.  He has 0/5 strenght in B Lext and 2-3/5 strength in upper extremities.. Most strength in left arm. Contractures in right arm that cause pain.  Skin: Skin is warm.  B toenails thickened deformed and discolored.          Assessment & Plan:

## 2013-11-19 NOTE — Assessment & Plan Note (Signed)
Due for re-eval. 

## 2013-11-19 NOTE — Patient Instructions (Addendum)
Call to let us know which BP med you are taking. Continue just one.  Apply tegaderm to decubitus ulcer. Rotate pressure of buttock as much as possible.  Let me know if not getting better as expected. Stop by the lab on way out.

## 2013-11-19 NOTE — Assessment & Plan Note (Signed)
Rotation and tegaderm.

## 2013-11-19 NOTE — Assessment & Plan Note (Addendum)
Improved on lisinopril or toprol. He will call to let  us know which one he is taking.

## 2013-11-20 ENCOUNTER — Telehealth: Payer: Self-pay | Admitting: *Deleted

## 2013-11-20 ENCOUNTER — Telehealth: Payer: Self-pay | Admitting: Family Medicine

## 2013-11-20 MED ORDER — ATORVASTATIN CALCIUM 40 MG PO TABS: 40.0000 mg | ORAL_TABLET | Freq: Every day | ORAL | Status: DC

## 2013-11-20 NOTE — Telephone Encounter (Signed)
Travis Cantrell notified as instructed by telephone.  He is agreeable to restarting the atorvastatin.  Prescription sent to [facility] [address]

## 2013-11-20 NOTE — Telephone Encounter (Signed)
emmi mailed  °

## 2013-11-20 NOTE — Telephone Encounter (Signed)
Message copied by Damita LackLORING, Genecis Veley S on Wed Nov 20, 2013 11:54 AM ------      Message from: Kerby NoraBEDSOLE, AMY E      Created: Tue Nov 19, 2013 11:15 PM       Notify pt blood sugar stable, nml kidney and liver function, slightly low sodium.       Cholesterol is far above goal. i recommend restarting chol medication Atorvastatin 40 mg daily. Check lipids AMT in 3 months. Le me know if pt agreeable. ------

## 2013-11-29 ENCOUNTER — Other Ambulatory Visit: Payer: Self-pay | Admitting: Family Medicine

## 2013-11-29 MED ORDER — LISINOPRIL 10 MG PO TABS: 10.0000 mg | ORAL_TABLET | Freq: Every day | ORAL | Status: DC

## 2013-11-29 MED ORDER — METOPROLOL SUCCINATE ER 50 MG PO TB24: 50.0000 mg | ORAL_TABLET | Freq: Every day | ORAL | Status: DC

## 2013-11-29 NOTE — Addendum Note (Signed)
Addended by: Damita LackLORING, Versa Craton S on: 11/29/2013 02:43 PM   Modules accepted: Orders, Medications

## 2013-11-29 NOTE — Telephone Encounter (Signed)
Last office visit 11/19/2013.  Diazepam last refilled 10/17/2013 for #30 with no refills.  Ok to refill?

## 2013-11-29 NOTE — Telephone Encounter (Signed)
Diazepam called to [facility] [address] 

## 2013-12-09 ENCOUNTER — Telehealth: Payer: Self-pay

## 2013-12-09 DIAGNOSIS — L8992 Pressure ulcer of unspecified site, stage 2: Secondary | ICD-10-CM

## 2013-12-09 NOTE — Telephone Encounter (Signed)
Done. I will let my partner, Dr. Ermalene Searing, manage this case. I would defer to her expertise in wound care.  Electronically Signed  By: Hannah Beat, MD On: 12/09/2013 1:06 PM

## 2013-12-09 NOTE — Telephone Encounter (Signed)
I think a referral to [facility] would be beneficial for Travis Cantrell given he has no transportation to come to office.  Will see if Dr. Patsy Lager will order referral since Dr. Ermalene Searing is not in the office.

## 2013-12-09 NOTE — Telephone Encounter (Signed)
Travis Cantrell pt's sister said decubitus on buttock is larger than the size of a dinner plate and is draining from small hole in center of decubitus. Area is red and painful. Travis thinks different dressing is needed or can referral for home health to evaluate decubitus be ordered. Travis request cb.

## 2013-12-09 NOTE — Telephone Encounter (Signed)
Agree with Home health referral

## 2014-01-02 ENCOUNTER — Other Ambulatory Visit: Payer: Self-pay | Admitting: Family Medicine

## 2014-01-02 NOTE — Telephone Encounter (Signed)
Last office visit 11/19/2013.  Last refilled diazepam 11/29/2013 for #30 with no refills; Ibuprofen 11/29/2013 for #90 with no refills.  Ok to refill?

## 2014-01-03 NOTE — Telephone Encounter (Signed)
Diazepam called to [facility] [address]

## 2014-01-29 ENCOUNTER — Other Ambulatory Visit: Payer: Self-pay | Admitting: *Deleted

## 2014-01-29 MED ORDER — NYSTATIN 100000 UNIT/GM EX POWD: CUTANEOUS | Status: DC

## 2014-01-29 NOTE — Telephone Encounter (Signed)
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 Rx sent to pharmacy as instructed by Dr. Patsy Lager.  Verbal order also given to Mady Haagensen for skilled nursing for wound care and social worker evaluation.  Form also faxed back to [place] at [phone].

## 2014-01-30 DIAGNOSIS — K219 Gastro-esophageal reflux disease without esophagitis: Secondary | ICD-10-CM | POA: Diagnosis not present

## 2014-01-30 DIAGNOSIS — F329 Major depressive disorder, single episode, unspecified: Secondary | ICD-10-CM | POA: Diagnosis not present

## 2014-01-30 DIAGNOSIS — L89153 Pressure ulcer of sacral region, stage 3: Secondary | ICD-10-CM | POA: Diagnosis not present

## 2014-01-30 DIAGNOSIS — Z993 Dependence on wheelchair: Secondary | ICD-10-CM | POA: Diagnosis not present

## 2014-01-30 DIAGNOSIS — I10 Essential (primary) hypertension: Secondary | ICD-10-CM | POA: Diagnosis not present

## 2014-02-03 ENCOUNTER — Telehealth: Payer: Self-pay

## 2014-02-03 NOTE — Telephone Encounter (Signed)
Spoke with Diane to see if she had Kasey's sisters phone number (emergency contact). She states she does but the number is no longer in service.  She just wanted to let Dr. Ermalene Searing know she has been unable to get in touch with Mr. Endsley to set up appointment for SW Eval.

## 2014-02-03 NOTE — Telephone Encounter (Signed)
Travis Cantrell social worker with [facility] left v/m;Travis Cantrell has not been able to reach pt to set up appt for social work evaluation. Travis Cantrell will most likely not be able to go out today to do eval since pt does not have v/m and pt is not answering the phone.

## 2014-02-04 ENCOUNTER — Telehealth: Payer: Self-pay | Admitting: *Deleted

## 2014-02-04 MED ORDER — METOPROLOL SUCCINATE ER 25 MG PO TB24: 25.0000 mg | ORAL_TABLET | Freq: Every day | ORAL | Status: DC

## 2014-02-04 MED ORDER — ATORVASTATIN CALCIUM 40 MG PO TABS
40.0000 mg | ORAL_TABLET | Freq: Every day | ORAL | Status: DC
Start: 2014-02-04 — End: 2014-07-02

## 2014-02-04 NOTE — Telephone Encounter (Signed)
Travis Cantrell notified to decrease his Metoprolol to 25 mg a day per Dr. Ermalene Searing.  New prescription has been sent to [facility] [address] While on the phone, [name] ask if he needed refills on any of his other medications.  Looks like he needs refills on his atorvastatin also. Refills sent to pharmacy.

## 2014-02-04 NOTE — Telephone Encounter (Signed)
[employer] called stating that pt's pulse is between 42-48 and pulse 142/90, nurse is asking that Dr.Bedsole call pt with new instructions on BP meds. She thinks something needs to be changed

## 2014-02-04 NOTE — Telephone Encounter (Signed)
Will tolearted slightly elevated Bpnin this pt. Pulse to low. Decrease metoprolol to 25 mg daily. Sent in new Rx.

## 2014-02-05 DIAGNOSIS — Z993 Dependence on wheelchair: Secondary | ICD-10-CM | POA: Diagnosis not present

## 2014-02-05 DIAGNOSIS — L89153 Pressure ulcer of sacral region, stage 3: Secondary | ICD-10-CM | POA: Diagnosis not present

## 2014-02-05 DIAGNOSIS — I10 Essential (primary) hypertension: Secondary | ICD-10-CM | POA: Diagnosis not present

## 2014-02-05 DIAGNOSIS — F329 Major depressive disorder, single episode, unspecified: Secondary | ICD-10-CM | POA: Diagnosis not present

## 2014-02-05 DIAGNOSIS — K219 Gastro-esophageal reflux disease without esophagitis: Secondary | ICD-10-CM | POA: Diagnosis not present

## 2014-02-06 DIAGNOSIS — L89153 Pressure ulcer of sacral region, stage 3: Secondary | ICD-10-CM | POA: Diagnosis not present

## 2014-02-06 DIAGNOSIS — F329 Major depressive disorder, single episode, unspecified: Secondary | ICD-10-CM | POA: Diagnosis not present

## 2014-02-06 DIAGNOSIS — I10 Essential (primary) hypertension: Secondary | ICD-10-CM | POA: Diagnosis not present

## 2014-02-06 DIAGNOSIS — K219 Gastro-esophageal reflux disease without esophagitis: Secondary | ICD-10-CM | POA: Diagnosis not present

## 2014-02-06 DIAGNOSIS — Z993 Dependence on wheelchair: Secondary | ICD-10-CM | POA: Diagnosis not present

## 2014-02-07 ENCOUNTER — Other Ambulatory Visit: Payer: Self-pay | Admitting: Family Medicine

## 2014-02-07 NOTE — Telephone Encounter (Signed)
Last office visit 11/19/2013.  Valium last refilled 01/03/2014 for #30 with no refills. Ibuprofen last refilled 01/03/2014 for #90 with no refills. Ok to refill?

## 2014-02-07 NOTE — Telephone Encounter (Signed)
Valium called in to [facility] [address]

## 2014-02-10 DIAGNOSIS — Z993 Dependence on wheelchair: Secondary | ICD-10-CM | POA: Diagnosis not present

## 2014-02-10 DIAGNOSIS — I10 Essential (primary) hypertension: Secondary | ICD-10-CM | POA: Diagnosis not present

## 2014-02-10 DIAGNOSIS — F329 Major depressive disorder, single episode, unspecified: Secondary | ICD-10-CM | POA: Diagnosis not present

## 2014-02-10 DIAGNOSIS — K219 Gastro-esophageal reflux disease without esophagitis: Secondary | ICD-10-CM | POA: Diagnosis not present

## 2014-02-10 DIAGNOSIS — L89153 Pressure ulcer of sacral region, stage 3: Secondary | ICD-10-CM | POA: Diagnosis not present

## 2014-02-11 ENCOUNTER — Telehealth: Payer: Self-pay

## 2014-02-11 NOTE — Telephone Encounter (Signed)
Travis Cantrell with [facility] left v/m wanting to know if Dr Ermalene Searing would order med supplies requested by pt; gauze,sterile water and wound dressing. Lisa request cb with ref # [id number].

## 2014-02-12 DIAGNOSIS — I10 Essential (primary) hypertension: Secondary | ICD-10-CM | POA: Diagnosis not present

## 2014-02-12 DIAGNOSIS — L89153 Pressure ulcer of sacral region, stage 3: Secondary | ICD-10-CM | POA: Diagnosis not present

## 2014-02-12 DIAGNOSIS — F329 Major depressive disorder, single episode, unspecified: Secondary | ICD-10-CM | POA: Diagnosis not present

## 2014-02-12 DIAGNOSIS — Z993 Dependence on wheelchair: Secondary | ICD-10-CM | POA: Diagnosis not present

## 2014-02-12 DIAGNOSIS — K219 Gastro-esophageal reflux disease without esophagitis: Secondary | ICD-10-CM | POA: Diagnosis not present

## 2014-02-12 NOTE — Telephone Encounter (Signed)
Travis Cantrell with [facility] left v/m requesting cb about previous note from 02/11/14.

## 2014-02-12 NOTE — Telephone Encounter (Signed)
Refill as requested 

## 2014-02-13 DIAGNOSIS — L89152 Pressure ulcer of sacral region, stage 2: Secondary | ICD-10-CM | POA: Diagnosis not present

## 2014-02-13 NOTE — Telephone Encounter (Signed)
Verbal order given to [facility] for wound supplies.  They will be faxing over order form for Dr. Ermalene Searing to sign.

## 2014-02-18 DIAGNOSIS — K219 Gastro-esophageal reflux disease without esophagitis: Secondary | ICD-10-CM | POA: Diagnosis not present

## 2014-02-18 DIAGNOSIS — Z993 Dependence on wheelchair: Secondary | ICD-10-CM | POA: Diagnosis not present

## 2014-02-18 DIAGNOSIS — F329 Major depressive disorder, single episode, unspecified: Secondary | ICD-10-CM | POA: Diagnosis not present

## 2014-02-18 DIAGNOSIS — L89153 Pressure ulcer of sacral region, stage 3: Secondary | ICD-10-CM | POA: Diagnosis not present

## 2014-02-18 DIAGNOSIS — I10 Essential (primary) hypertension: Secondary | ICD-10-CM | POA: Diagnosis not present

## 2014-02-21 DIAGNOSIS — L89153 Pressure ulcer of sacral region, stage 3: Secondary | ICD-10-CM | POA: Diagnosis not present

## 2014-02-21 DIAGNOSIS — F329 Major depressive disorder, single episode, unspecified: Secondary | ICD-10-CM | POA: Diagnosis not present

## 2014-02-21 DIAGNOSIS — K219 Gastro-esophageal reflux disease without esophagitis: Secondary | ICD-10-CM | POA: Diagnosis not present

## 2014-02-21 DIAGNOSIS — Z993 Dependence on wheelchair: Secondary | ICD-10-CM | POA: Diagnosis not present

## 2014-02-21 DIAGNOSIS — I10 Essential (primary) hypertension: Secondary | ICD-10-CM | POA: Diagnosis not present

## 2014-02-24 DIAGNOSIS — M62421 Contracture of muscle, right upper arm: Secondary | ICD-10-CM | POA: Diagnosis not present

## 2014-02-24 DIAGNOSIS — J159 Unspecified bacterial pneumonia: Secondary | ICD-10-CM | POA: Diagnosis not present

## 2014-02-24 DIAGNOSIS — Z9181 History of falling: Secondary | ICD-10-CM | POA: Diagnosis not present

## 2014-02-24 DIAGNOSIS — B351 Tinea unguium: Secondary | ICD-10-CM | POA: Diagnosis not present

## 2014-02-24 DIAGNOSIS — L89153 Pressure ulcer of sacral region, stage 3: Secondary | ICD-10-CM | POA: Diagnosis not present

## 2014-02-24 DIAGNOSIS — E780 Pure hypercholesterolemia: Secondary | ICD-10-CM | POA: Diagnosis not present

## 2014-02-24 DIAGNOSIS — K219 Gastro-esophageal reflux disease without esophagitis: Secondary | ICD-10-CM | POA: Diagnosis not present

## 2014-02-24 DIAGNOSIS — Z436 Encounter for attention to other artificial openings of urinary tract: Secondary | ICD-10-CM | POA: Diagnosis not present

## 2014-02-24 DIAGNOSIS — B368 Other specified superficial mycoses: Secondary | ICD-10-CM | POA: Diagnosis not present

## 2014-02-24 DIAGNOSIS — G35 Multiple sclerosis: Secondary | ICD-10-CM | POA: Diagnosis not present

## 2014-02-24 DIAGNOSIS — R7309 Other abnormal glucose: Secondary | ICD-10-CM | POA: Diagnosis not present

## 2014-02-24 DIAGNOSIS — M62462 Contracture of muscle, left lower leg: Secondary | ICD-10-CM | POA: Diagnosis not present

## 2014-02-24 DIAGNOSIS — Z993 Dependence on wheelchair: Secondary | ICD-10-CM | POA: Diagnosis not present

## 2014-02-24 DIAGNOSIS — I10 Essential (primary) hypertension: Secondary | ICD-10-CM | POA: Diagnosis not present

## 2014-02-24 DIAGNOSIS — F329 Major depressive disorder, single episode, unspecified: Secondary | ICD-10-CM | POA: Diagnosis not present

## 2014-02-24 DIAGNOSIS — L89152 Pressure ulcer of sacral region, stage 2: Secondary | ICD-10-CM | POA: Diagnosis not present

## 2014-02-24 DIAGNOSIS — M62461 Contracture of muscle, right lower leg: Secondary | ICD-10-CM | POA: Diagnosis not present

## 2014-02-26 DIAGNOSIS — G35 Multiple sclerosis: Secondary | ICD-10-CM | POA: Diagnosis not present

## 2014-02-26 DIAGNOSIS — K219 Gastro-esophageal reflux disease without esophagitis: Secondary | ICD-10-CM | POA: Diagnosis not present

## 2014-02-26 DIAGNOSIS — M62421 Contracture of muscle, right upper arm: Secondary | ICD-10-CM | POA: Diagnosis not present

## 2014-02-26 DIAGNOSIS — F329 Major depressive disorder, single episode, unspecified: Secondary | ICD-10-CM | POA: Diagnosis not present

## 2014-02-26 DIAGNOSIS — B368 Other specified superficial mycoses: Secondary | ICD-10-CM | POA: Diagnosis not present

## 2014-02-26 DIAGNOSIS — J159 Unspecified bacterial pneumonia: Secondary | ICD-10-CM | POA: Diagnosis not present

## 2014-02-26 DIAGNOSIS — Z9181 History of falling: Secondary | ICD-10-CM | POA: Diagnosis not present

## 2014-02-26 DIAGNOSIS — Z993 Dependence on wheelchair: Secondary | ICD-10-CM | POA: Diagnosis not present

## 2014-02-26 DIAGNOSIS — R7309 Other abnormal glucose: Secondary | ICD-10-CM | POA: Diagnosis not present

## 2014-02-26 DIAGNOSIS — I10 Essential (primary) hypertension: Secondary | ICD-10-CM | POA: Diagnosis not present

## 2014-02-26 DIAGNOSIS — E780 Pure hypercholesterolemia: Secondary | ICD-10-CM | POA: Diagnosis not present

## 2014-02-26 DIAGNOSIS — B351 Tinea unguium: Secondary | ICD-10-CM | POA: Diagnosis not present

## 2014-02-26 DIAGNOSIS — M62461 Contracture of muscle, right lower leg: Secondary | ICD-10-CM | POA: Diagnosis not present

## 2014-02-26 DIAGNOSIS — L89153 Pressure ulcer of sacral region, stage 3: Secondary | ICD-10-CM | POA: Diagnosis not present

## 2014-02-26 DIAGNOSIS — L89152 Pressure ulcer of sacral region, stage 2: Secondary | ICD-10-CM | POA: Diagnosis not present

## 2014-02-26 DIAGNOSIS — Z436 Encounter for attention to other artificial openings of urinary tract: Secondary | ICD-10-CM | POA: Diagnosis not present

## 2014-02-26 DIAGNOSIS — M62462 Contracture of muscle, left lower leg: Secondary | ICD-10-CM | POA: Diagnosis not present

## 2014-02-28 ENCOUNTER — Telehealth: Payer: Self-pay | Admitting: Family Medicine

## 2014-02-28 DIAGNOSIS — R7309 Other abnormal glucose: Secondary | ICD-10-CM | POA: Diagnosis not present

## 2014-02-28 DIAGNOSIS — Z993 Dependence on wheelchair: Secondary | ICD-10-CM | POA: Diagnosis not present

## 2014-02-28 DIAGNOSIS — B368 Other specified superficial mycoses: Secondary | ICD-10-CM | POA: Diagnosis not present

## 2014-02-28 DIAGNOSIS — B351 Tinea unguium: Secondary | ICD-10-CM | POA: Diagnosis not present

## 2014-02-28 DIAGNOSIS — F329 Major depressive disorder, single episode, unspecified: Secondary | ICD-10-CM | POA: Diagnosis not present

## 2014-02-28 DIAGNOSIS — M62462 Contracture of muscle, left lower leg: Secondary | ICD-10-CM | POA: Diagnosis not present

## 2014-02-28 DIAGNOSIS — L89152 Pressure ulcer of sacral region, stage 2: Secondary | ICD-10-CM | POA: Diagnosis not present

## 2014-02-28 DIAGNOSIS — J159 Unspecified bacterial pneumonia: Secondary | ICD-10-CM | POA: Diagnosis not present

## 2014-02-28 DIAGNOSIS — M62421 Contracture of muscle, right upper arm: Secondary | ICD-10-CM | POA: Diagnosis not present

## 2014-02-28 DIAGNOSIS — L89153 Pressure ulcer of sacral region, stage 3: Secondary | ICD-10-CM | POA: Diagnosis not present

## 2014-02-28 DIAGNOSIS — Z436 Encounter for attention to other artificial openings of urinary tract: Secondary | ICD-10-CM | POA: Diagnosis not present

## 2014-02-28 DIAGNOSIS — E780 Pure hypercholesterolemia: Secondary | ICD-10-CM | POA: Diagnosis not present

## 2014-02-28 DIAGNOSIS — I10 Essential (primary) hypertension: Secondary | ICD-10-CM | POA: Diagnosis not present

## 2014-02-28 DIAGNOSIS — K219 Gastro-esophageal reflux disease without esophagitis: Secondary | ICD-10-CM | POA: Diagnosis not present

## 2014-02-28 DIAGNOSIS — Z9181 History of falling: Secondary | ICD-10-CM | POA: Diagnosis not present

## 2014-02-28 DIAGNOSIS — G35 Multiple sclerosis: Secondary | ICD-10-CM | POA: Diagnosis not present

## 2014-02-28 DIAGNOSIS — M62461 Contracture of muscle, right lower leg: Secondary | ICD-10-CM | POA: Diagnosis not present

## 2014-02-28 NOTE — Telephone Encounter (Signed)
Order faxed to [facility] [phone].

## 2014-02-28 NOTE — Telephone Encounter (Signed)
Rx in out box

## 2014-02-28 NOTE — Telephone Encounter (Signed)
Darl Pikes from [facility] care called and after evaluating pt, they believe the patient would benefit from an air mattress.  If you agree to order, please fax order to [phone].  They would like to get facilitated by the weekend.  Best number to call Darl Pikes is [phone] if any further questions. / lt

## 2014-03-03 DIAGNOSIS — B368 Other specified superficial mycoses: Secondary | ICD-10-CM | POA: Diagnosis not present

## 2014-03-03 DIAGNOSIS — E780 Pure hypercholesterolemia: Secondary | ICD-10-CM | POA: Diagnosis not present

## 2014-03-03 DIAGNOSIS — Z436 Encounter for attention to other artificial openings of urinary tract: Secondary | ICD-10-CM | POA: Diagnosis not present

## 2014-03-03 DIAGNOSIS — G35 Multiple sclerosis: Secondary | ICD-10-CM | POA: Diagnosis not present

## 2014-03-03 DIAGNOSIS — F329 Major depressive disorder, single episode, unspecified: Secondary | ICD-10-CM | POA: Diagnosis not present

## 2014-03-03 DIAGNOSIS — M62461 Contracture of muscle, right lower leg: Secondary | ICD-10-CM | POA: Diagnosis not present

## 2014-03-03 DIAGNOSIS — L89153 Pressure ulcer of sacral region, stage 3: Secondary | ICD-10-CM | POA: Diagnosis not present

## 2014-03-03 DIAGNOSIS — B351 Tinea unguium: Secondary | ICD-10-CM | POA: Diagnosis not present

## 2014-03-03 DIAGNOSIS — Z9181 History of falling: Secondary | ICD-10-CM | POA: Diagnosis not present

## 2014-03-03 DIAGNOSIS — M62462 Contracture of muscle, left lower leg: Secondary | ICD-10-CM | POA: Diagnosis not present

## 2014-03-03 DIAGNOSIS — Z993 Dependence on wheelchair: Secondary | ICD-10-CM | POA: Diagnosis not present

## 2014-03-03 DIAGNOSIS — M62421 Contracture of muscle, right upper arm: Secondary | ICD-10-CM | POA: Diagnosis not present

## 2014-03-03 DIAGNOSIS — J159 Unspecified bacterial pneumonia: Secondary | ICD-10-CM | POA: Diagnosis not present

## 2014-03-03 DIAGNOSIS — I10 Essential (primary) hypertension: Secondary | ICD-10-CM | POA: Diagnosis not present

## 2014-03-03 DIAGNOSIS — R7309 Other abnormal glucose: Secondary | ICD-10-CM | POA: Diagnosis not present

## 2014-03-03 DIAGNOSIS — K219 Gastro-esophageal reflux disease without esophagitis: Secondary | ICD-10-CM | POA: Diagnosis not present

## 2014-03-03 DIAGNOSIS — L89152 Pressure ulcer of sacral region, stage 2: Secondary | ICD-10-CM | POA: Diagnosis not present

## 2014-03-04 ENCOUNTER — Other Ambulatory Visit: Payer: Self-pay | Admitting: Family Medicine

## 2014-03-04 NOTE — Telephone Encounter (Signed)
Last office visit 11/19/2013.  Last refilled 11/29/2013 for #360 with no refills.  Ok to refill?

## 2014-03-05 DIAGNOSIS — B351 Tinea unguium: Secondary | ICD-10-CM | POA: Diagnosis not present

## 2014-03-05 DIAGNOSIS — Z993 Dependence on wheelchair: Secondary | ICD-10-CM | POA: Diagnosis not present

## 2014-03-05 DIAGNOSIS — M62462 Contracture of muscle, left lower leg: Secondary | ICD-10-CM | POA: Diagnosis not present

## 2014-03-05 DIAGNOSIS — L89153 Pressure ulcer of sacral region, stage 3: Secondary | ICD-10-CM | POA: Diagnosis not present

## 2014-03-05 DIAGNOSIS — B368 Other specified superficial mycoses: Secondary | ICD-10-CM | POA: Diagnosis not present

## 2014-03-05 DIAGNOSIS — M62421 Contracture of muscle, right upper arm: Secondary | ICD-10-CM | POA: Diagnosis not present

## 2014-03-05 DIAGNOSIS — J159 Unspecified bacterial pneumonia: Secondary | ICD-10-CM | POA: Diagnosis not present

## 2014-03-05 DIAGNOSIS — M62461 Contracture of muscle, right lower leg: Secondary | ICD-10-CM | POA: Diagnosis not present

## 2014-03-05 DIAGNOSIS — Z9181 History of falling: Secondary | ICD-10-CM | POA: Diagnosis not present

## 2014-03-05 DIAGNOSIS — I10 Essential (primary) hypertension: Secondary | ICD-10-CM | POA: Diagnosis not present

## 2014-03-05 DIAGNOSIS — L89152 Pressure ulcer of sacral region, stage 2: Secondary | ICD-10-CM | POA: Diagnosis not present

## 2014-03-05 DIAGNOSIS — G35 Multiple sclerosis: Secondary | ICD-10-CM | POA: Diagnosis not present

## 2014-03-05 DIAGNOSIS — R7309 Other abnormal glucose: Secondary | ICD-10-CM | POA: Diagnosis not present

## 2014-03-05 DIAGNOSIS — E780 Pure hypercholesterolemia: Secondary | ICD-10-CM | POA: Diagnosis not present

## 2014-03-05 DIAGNOSIS — K219 Gastro-esophageal reflux disease without esophagitis: Secondary | ICD-10-CM | POA: Diagnosis not present

## 2014-03-05 DIAGNOSIS — F329 Major depressive disorder, single episode, unspecified: Secondary | ICD-10-CM | POA: Diagnosis not present

## 2014-03-05 DIAGNOSIS — Z436 Encounter for attention to other artificial openings of urinary tract: Secondary | ICD-10-CM | POA: Diagnosis not present

## 2014-03-06 DIAGNOSIS — M6281 Muscle weakness (generalized): Secondary | ICD-10-CM | POA: Diagnosis not present

## 2014-03-06 DIAGNOSIS — Z9181 History of falling: Secondary | ICD-10-CM | POA: Diagnosis not present

## 2014-03-06 DIAGNOSIS — L89151 Pressure ulcer of sacral region, stage 1: Secondary | ICD-10-CM | POA: Diagnosis not present

## 2014-03-06 DIAGNOSIS — L89301 Pressure ulcer of unspecified buttock, stage 1: Secondary | ICD-10-CM | POA: Diagnosis not present

## 2014-03-06 DIAGNOSIS — G35 Multiple sclerosis: Secondary | ICD-10-CM | POA: Diagnosis not present

## 2014-03-07 DIAGNOSIS — L89153 Pressure ulcer of sacral region, stage 3: Secondary | ICD-10-CM | POA: Diagnosis not present

## 2014-03-07 DIAGNOSIS — B368 Other specified superficial mycoses: Secondary | ICD-10-CM | POA: Diagnosis not present

## 2014-03-07 DIAGNOSIS — Z9181 History of falling: Secondary | ICD-10-CM | POA: Diagnosis not present

## 2014-03-07 DIAGNOSIS — K219 Gastro-esophageal reflux disease without esophagitis: Secondary | ICD-10-CM | POA: Diagnosis not present

## 2014-03-07 DIAGNOSIS — Z993 Dependence on wheelchair: Secondary | ICD-10-CM | POA: Diagnosis not present

## 2014-03-07 DIAGNOSIS — M62462 Contracture of muscle, left lower leg: Secondary | ICD-10-CM | POA: Diagnosis not present

## 2014-03-07 DIAGNOSIS — Z436 Encounter for attention to other artificial openings of urinary tract: Secondary | ICD-10-CM | POA: Diagnosis not present

## 2014-03-07 DIAGNOSIS — J159 Unspecified bacterial pneumonia: Secondary | ICD-10-CM | POA: Diagnosis not present

## 2014-03-07 DIAGNOSIS — M62421 Contracture of muscle, right upper arm: Secondary | ICD-10-CM | POA: Diagnosis not present

## 2014-03-07 DIAGNOSIS — B351 Tinea unguium: Secondary | ICD-10-CM | POA: Diagnosis not present

## 2014-03-07 DIAGNOSIS — E780 Pure hypercholesterolemia: Secondary | ICD-10-CM | POA: Diagnosis not present

## 2014-03-07 DIAGNOSIS — G35 Multiple sclerosis: Secondary | ICD-10-CM | POA: Diagnosis not present

## 2014-03-07 DIAGNOSIS — F329 Major depressive disorder, single episode, unspecified: Secondary | ICD-10-CM | POA: Diagnosis not present

## 2014-03-07 DIAGNOSIS — L89152 Pressure ulcer of sacral region, stage 2: Secondary | ICD-10-CM | POA: Diagnosis not present

## 2014-03-07 DIAGNOSIS — M62461 Contracture of muscle, right lower leg: Secondary | ICD-10-CM | POA: Diagnosis not present

## 2014-03-07 DIAGNOSIS — R7309 Other abnormal glucose: Secondary | ICD-10-CM | POA: Diagnosis not present

## 2014-03-07 DIAGNOSIS — I10 Essential (primary) hypertension: Secondary | ICD-10-CM | POA: Diagnosis not present

## 2014-03-09 DIAGNOSIS — K219 Gastro-esophageal reflux disease without esophagitis: Secondary | ICD-10-CM | POA: Diagnosis not present

## 2014-03-09 DIAGNOSIS — Z993 Dependence on wheelchair: Secondary | ICD-10-CM | POA: Diagnosis not present

## 2014-03-09 DIAGNOSIS — M62462 Contracture of muscle, left lower leg: Secondary | ICD-10-CM | POA: Diagnosis not present

## 2014-03-09 DIAGNOSIS — Z436 Encounter for attention to other artificial openings of urinary tract: Secondary | ICD-10-CM | POA: Diagnosis not present

## 2014-03-09 DIAGNOSIS — G35 Multiple sclerosis: Secondary | ICD-10-CM | POA: Diagnosis not present

## 2014-03-09 DIAGNOSIS — R7309 Other abnormal glucose: Secondary | ICD-10-CM | POA: Diagnosis not present

## 2014-03-09 DIAGNOSIS — F329 Major depressive disorder, single episode, unspecified: Secondary | ICD-10-CM | POA: Diagnosis not present

## 2014-03-09 DIAGNOSIS — B351 Tinea unguium: Secondary | ICD-10-CM | POA: Diagnosis not present

## 2014-03-09 DIAGNOSIS — L89152 Pressure ulcer of sacral region, stage 2: Secondary | ICD-10-CM | POA: Diagnosis not present

## 2014-03-09 DIAGNOSIS — B368 Other specified superficial mycoses: Secondary | ICD-10-CM | POA: Diagnosis not present

## 2014-03-09 DIAGNOSIS — M62461 Contracture of muscle, right lower leg: Secondary | ICD-10-CM | POA: Diagnosis not present

## 2014-03-09 DIAGNOSIS — Z9181 History of falling: Secondary | ICD-10-CM | POA: Diagnosis not present

## 2014-03-09 DIAGNOSIS — M62421 Contracture of muscle, right upper arm: Secondary | ICD-10-CM | POA: Diagnosis not present

## 2014-03-09 DIAGNOSIS — I10 Essential (primary) hypertension: Secondary | ICD-10-CM | POA: Diagnosis not present

## 2014-03-09 DIAGNOSIS — E780 Pure hypercholesterolemia: Secondary | ICD-10-CM | POA: Diagnosis not present

## 2014-03-09 DIAGNOSIS — L89153 Pressure ulcer of sacral region, stage 3: Secondary | ICD-10-CM | POA: Diagnosis not present

## 2014-03-09 DIAGNOSIS — J159 Unspecified bacterial pneumonia: Secondary | ICD-10-CM | POA: Diagnosis not present

## 2014-03-10 ENCOUNTER — Other Ambulatory Visit: Payer: Self-pay | Admitting: Family Medicine

## 2014-03-12 DIAGNOSIS — G35 Multiple sclerosis: Secondary | ICD-10-CM | POA: Diagnosis not present

## 2014-03-12 DIAGNOSIS — Z436 Encounter for attention to other artificial openings of urinary tract: Secondary | ICD-10-CM | POA: Diagnosis not present

## 2014-03-12 DIAGNOSIS — L89153 Pressure ulcer of sacral region, stage 3: Secondary | ICD-10-CM | POA: Diagnosis not present

## 2014-03-12 DIAGNOSIS — J159 Unspecified bacterial pneumonia: Secondary | ICD-10-CM | POA: Diagnosis not present

## 2014-03-12 DIAGNOSIS — E780 Pure hypercholesterolemia: Secondary | ICD-10-CM | POA: Diagnosis not present

## 2014-03-12 DIAGNOSIS — L89152 Pressure ulcer of sacral region, stage 2: Secondary | ICD-10-CM | POA: Diagnosis not present

## 2014-03-12 DIAGNOSIS — F329 Major depressive disorder, single episode, unspecified: Secondary | ICD-10-CM | POA: Diagnosis not present

## 2014-03-12 DIAGNOSIS — Z993 Dependence on wheelchair: Secondary | ICD-10-CM | POA: Diagnosis not present

## 2014-03-12 DIAGNOSIS — R7309 Other abnormal glucose: Secondary | ICD-10-CM | POA: Diagnosis not present

## 2014-03-12 DIAGNOSIS — I10 Essential (primary) hypertension: Secondary | ICD-10-CM | POA: Diagnosis not present

## 2014-03-12 DIAGNOSIS — Z9181 History of falling: Secondary | ICD-10-CM | POA: Diagnosis not present

## 2014-03-12 DIAGNOSIS — B351 Tinea unguium: Secondary | ICD-10-CM | POA: Diagnosis not present

## 2014-03-12 DIAGNOSIS — M62461 Contracture of muscle, right lower leg: Secondary | ICD-10-CM | POA: Diagnosis not present

## 2014-03-12 DIAGNOSIS — M62462 Contracture of muscle, left lower leg: Secondary | ICD-10-CM | POA: Diagnosis not present

## 2014-03-12 DIAGNOSIS — K219 Gastro-esophageal reflux disease without esophagitis: Secondary | ICD-10-CM | POA: Diagnosis not present

## 2014-03-12 DIAGNOSIS — M62421 Contracture of muscle, right upper arm: Secondary | ICD-10-CM | POA: Diagnosis not present

## 2014-03-12 DIAGNOSIS — B368 Other specified superficial mycoses: Secondary | ICD-10-CM | POA: Diagnosis not present

## 2014-03-14 DIAGNOSIS — L89153 Pressure ulcer of sacral region, stage 3: Secondary | ICD-10-CM | POA: Diagnosis not present

## 2014-03-14 DIAGNOSIS — R7309 Other abnormal glucose: Secondary | ICD-10-CM | POA: Diagnosis not present

## 2014-03-14 DIAGNOSIS — Z993 Dependence on wheelchair: Secondary | ICD-10-CM | POA: Diagnosis not present

## 2014-03-14 DIAGNOSIS — E780 Pure hypercholesterolemia: Secondary | ICD-10-CM | POA: Diagnosis not present

## 2014-03-14 DIAGNOSIS — Z436 Encounter for attention to other artificial openings of urinary tract: Secondary | ICD-10-CM | POA: Diagnosis not present

## 2014-03-14 DIAGNOSIS — F329 Major depressive disorder, single episode, unspecified: Secondary | ICD-10-CM | POA: Diagnosis not present

## 2014-03-14 DIAGNOSIS — B368 Other specified superficial mycoses: Secondary | ICD-10-CM | POA: Diagnosis not present

## 2014-03-14 DIAGNOSIS — L89152 Pressure ulcer of sacral region, stage 2: Secondary | ICD-10-CM | POA: Diagnosis not present

## 2014-03-14 DIAGNOSIS — G35 Multiple sclerosis: Secondary | ICD-10-CM | POA: Diagnosis not present

## 2014-03-14 DIAGNOSIS — Z9181 History of falling: Secondary | ICD-10-CM | POA: Diagnosis not present

## 2014-03-14 DIAGNOSIS — M62421 Contracture of muscle, right upper arm: Secondary | ICD-10-CM | POA: Diagnosis not present

## 2014-03-14 DIAGNOSIS — M62461 Contracture of muscle, right lower leg: Secondary | ICD-10-CM | POA: Diagnosis not present

## 2014-03-14 DIAGNOSIS — K219 Gastro-esophageal reflux disease without esophagitis: Secondary | ICD-10-CM | POA: Diagnosis not present

## 2014-03-14 DIAGNOSIS — I10 Essential (primary) hypertension: Secondary | ICD-10-CM | POA: Diagnosis not present

## 2014-03-14 DIAGNOSIS — J159 Unspecified bacterial pneumonia: Secondary | ICD-10-CM | POA: Diagnosis not present

## 2014-03-14 DIAGNOSIS — M62462 Contracture of muscle, left lower leg: Secondary | ICD-10-CM | POA: Diagnosis not present

## 2014-03-14 DIAGNOSIS — B351 Tinea unguium: Secondary | ICD-10-CM | POA: Diagnosis not present

## 2014-03-17 ENCOUNTER — Other Ambulatory Visit: Payer: Self-pay | Admitting: Family Medicine

## 2014-03-17 ENCOUNTER — Telehealth: Payer: Self-pay

## 2014-03-17 DIAGNOSIS — F329 Major depressive disorder, single episode, unspecified: Secondary | ICD-10-CM | POA: Diagnosis not present

## 2014-03-17 DIAGNOSIS — M62462 Contracture of muscle, left lower leg: Secondary | ICD-10-CM | POA: Diagnosis not present

## 2014-03-17 DIAGNOSIS — M62421 Contracture of muscle, right upper arm: Secondary | ICD-10-CM | POA: Diagnosis not present

## 2014-03-17 DIAGNOSIS — Z9181 History of falling: Secondary | ICD-10-CM | POA: Diagnosis not present

## 2014-03-17 DIAGNOSIS — B368 Other specified superficial mycoses: Secondary | ICD-10-CM | POA: Diagnosis not present

## 2014-03-17 DIAGNOSIS — R7309 Other abnormal glucose: Secondary | ICD-10-CM | POA: Diagnosis not present

## 2014-03-17 DIAGNOSIS — B351 Tinea unguium: Secondary | ICD-10-CM | POA: Diagnosis not present

## 2014-03-17 DIAGNOSIS — J159 Unspecified bacterial pneumonia: Secondary | ICD-10-CM | POA: Diagnosis not present

## 2014-03-17 DIAGNOSIS — K219 Gastro-esophageal reflux disease without esophagitis: Secondary | ICD-10-CM | POA: Diagnosis not present

## 2014-03-17 DIAGNOSIS — Z436 Encounter for attention to other artificial openings of urinary tract: Secondary | ICD-10-CM | POA: Diagnosis not present

## 2014-03-17 DIAGNOSIS — G35 Multiple sclerosis: Secondary | ICD-10-CM | POA: Diagnosis not present

## 2014-03-17 DIAGNOSIS — M62461 Contracture of muscle, right lower leg: Secondary | ICD-10-CM | POA: Diagnosis not present

## 2014-03-17 DIAGNOSIS — E780 Pure hypercholesterolemia: Secondary | ICD-10-CM | POA: Diagnosis not present

## 2014-03-17 DIAGNOSIS — Z993 Dependence on wheelchair: Secondary | ICD-10-CM | POA: Diagnosis not present

## 2014-03-17 DIAGNOSIS — I10 Essential (primary) hypertension: Secondary | ICD-10-CM | POA: Diagnosis not present

## 2014-03-17 DIAGNOSIS — L89153 Pressure ulcer of sacral region, stage 3: Secondary | ICD-10-CM | POA: Diagnosis not present

## 2014-03-17 DIAGNOSIS — L89152 Pressure ulcer of sacral region, stage 2: Secondary | ICD-10-CM | POA: Diagnosis not present

## 2014-03-17 NOTE — Telephone Encounter (Signed)
Chipper Oman nurse with [facility] left v/m requesting verbal order to decrease home health nursing visits from 3 x a week to 1 x a week due to improvement to sore and pt's itching greatly improved.Please advise.

## 2014-03-17 NOTE — Telephone Encounter (Signed)
Last office visit 11/19/2013.  Last refilled 02/07/2014 for #30 with no refills.  Ok to refill?

## 2014-03-17 NOTE — Telephone Encounter (Signed)
Okay to give verbal orders as requested. 

## 2014-03-18 NOTE — Telephone Encounter (Signed)
Verbal order given to Chipper Oman with [facility] to decrease nursing visit from 3 x a week to 1 x a week.

## 2014-03-18 NOTE — Telephone Encounter (Signed)
Called to [facility] [address] 

## 2014-03-24 DIAGNOSIS — E780 Pure hypercholesterolemia: Secondary | ICD-10-CM | POA: Diagnosis not present

## 2014-03-24 DIAGNOSIS — B351 Tinea unguium: Secondary | ICD-10-CM | POA: Diagnosis not present

## 2014-03-24 DIAGNOSIS — Z436 Encounter for attention to other artificial openings of urinary tract: Secondary | ICD-10-CM | POA: Diagnosis not present

## 2014-03-24 DIAGNOSIS — F329 Major depressive disorder, single episode, unspecified: Secondary | ICD-10-CM | POA: Diagnosis not present

## 2014-03-24 DIAGNOSIS — M62421 Contracture of muscle, right upper arm: Secondary | ICD-10-CM | POA: Diagnosis not present

## 2014-03-24 DIAGNOSIS — J159 Unspecified bacterial pneumonia: Secondary | ICD-10-CM | POA: Diagnosis not present

## 2014-03-24 DIAGNOSIS — M62462 Contracture of muscle, left lower leg: Secondary | ICD-10-CM | POA: Diagnosis not present

## 2014-03-24 DIAGNOSIS — M62461 Contracture of muscle, right lower leg: Secondary | ICD-10-CM | POA: Diagnosis not present

## 2014-03-24 DIAGNOSIS — Z9181 History of falling: Secondary | ICD-10-CM | POA: Diagnosis not present

## 2014-03-24 DIAGNOSIS — L89152 Pressure ulcer of sacral region, stage 2: Secondary | ICD-10-CM | POA: Diagnosis not present

## 2014-03-24 DIAGNOSIS — I10 Essential (primary) hypertension: Secondary | ICD-10-CM | POA: Diagnosis not present

## 2014-03-24 DIAGNOSIS — R7309 Other abnormal glucose: Secondary | ICD-10-CM | POA: Diagnosis not present

## 2014-03-24 DIAGNOSIS — L89153 Pressure ulcer of sacral region, stage 3: Secondary | ICD-10-CM | POA: Diagnosis not present

## 2014-03-24 DIAGNOSIS — K219 Gastro-esophageal reflux disease without esophagitis: Secondary | ICD-10-CM | POA: Diagnosis not present

## 2014-03-24 DIAGNOSIS — B368 Other specified superficial mycoses: Secondary | ICD-10-CM | POA: Diagnosis not present

## 2014-03-24 DIAGNOSIS — Z993 Dependence on wheelchair: Secondary | ICD-10-CM | POA: Diagnosis not present

## 2014-03-24 DIAGNOSIS — G35 Multiple sclerosis: Secondary | ICD-10-CM | POA: Diagnosis not present

## 2014-03-26 DIAGNOSIS — N319 Neuromuscular dysfunction of bladder, unspecified: Secondary | ICD-10-CM | POA: Diagnosis not present

## 2014-03-26 DIAGNOSIS — Q649 Congenital malformation of urinary system, unspecified: Secondary | ICD-10-CM | POA: Diagnosis not present

## 2014-04-01 DIAGNOSIS — Z436 Encounter for attention to other artificial openings of urinary tract: Secondary | ICD-10-CM | POA: Diagnosis not present

## 2014-04-01 DIAGNOSIS — L89152 Pressure ulcer of sacral region, stage 2: Secondary | ICD-10-CM | POA: Diagnosis not present

## 2014-04-01 DIAGNOSIS — L89153 Pressure ulcer of sacral region, stage 3: Secondary | ICD-10-CM | POA: Diagnosis not present

## 2014-04-01 DIAGNOSIS — G35 Multiple sclerosis: Secondary | ICD-10-CM | POA: Diagnosis not present

## 2014-04-01 DIAGNOSIS — E780 Pure hypercholesterolemia: Secondary | ICD-10-CM | POA: Diagnosis not present

## 2014-04-01 DIAGNOSIS — J159 Unspecified bacterial pneumonia: Secondary | ICD-10-CM | POA: Diagnosis not present

## 2014-04-01 DIAGNOSIS — K219 Gastro-esophageal reflux disease without esophagitis: Secondary | ICD-10-CM | POA: Diagnosis not present

## 2014-04-01 DIAGNOSIS — B368 Other specified superficial mycoses: Secondary | ICD-10-CM | POA: Diagnosis not present

## 2014-04-01 DIAGNOSIS — M62461 Contracture of muscle, right lower leg: Secondary | ICD-10-CM | POA: Diagnosis not present

## 2014-04-01 DIAGNOSIS — Z9181 History of falling: Secondary | ICD-10-CM | POA: Diagnosis not present

## 2014-04-01 DIAGNOSIS — I10 Essential (primary) hypertension: Secondary | ICD-10-CM | POA: Diagnosis not present

## 2014-04-01 DIAGNOSIS — F329 Major depressive disorder, single episode, unspecified: Secondary | ICD-10-CM | POA: Diagnosis not present

## 2014-04-01 DIAGNOSIS — M62421 Contracture of muscle, right upper arm: Secondary | ICD-10-CM | POA: Diagnosis not present

## 2014-04-01 DIAGNOSIS — M62462 Contracture of muscle, left lower leg: Secondary | ICD-10-CM | POA: Diagnosis not present

## 2014-04-01 DIAGNOSIS — R7309 Other abnormal glucose: Secondary | ICD-10-CM | POA: Diagnosis not present

## 2014-04-01 DIAGNOSIS — B351 Tinea unguium: Secondary | ICD-10-CM | POA: Diagnosis not present

## 2014-04-01 DIAGNOSIS — Z993 Dependence on wheelchair: Secondary | ICD-10-CM | POA: Diagnosis not present

## 2014-04-03 DIAGNOSIS — L89152 Pressure ulcer of sacral region, stage 2: Secondary | ICD-10-CM | POA: Diagnosis not present

## 2014-04-04 DIAGNOSIS — L89152 Pressure ulcer of sacral region, stage 2: Secondary | ICD-10-CM | POA: Diagnosis not present

## 2014-04-04 DIAGNOSIS — L89151 Pressure ulcer of sacral region, stage 1: Secondary | ICD-10-CM | POA: Diagnosis not present

## 2014-04-04 DIAGNOSIS — M6281 Muscle weakness (generalized): Secondary | ICD-10-CM | POA: Diagnosis not present

## 2014-04-04 DIAGNOSIS — L89301 Pressure ulcer of unspecified buttock, stage 1: Secondary | ICD-10-CM | POA: Diagnosis not present

## 2014-04-04 DIAGNOSIS — G35 Multiple sclerosis: Secondary | ICD-10-CM | POA: Diagnosis not present

## 2014-04-04 DIAGNOSIS — Z9181 History of falling: Secondary | ICD-10-CM | POA: Diagnosis not present

## 2014-04-07 DIAGNOSIS — K219 Gastro-esophageal reflux disease without esophagitis: Secondary | ICD-10-CM | POA: Diagnosis not present

## 2014-04-07 DIAGNOSIS — M62461 Contracture of muscle, right lower leg: Secondary | ICD-10-CM | POA: Diagnosis not present

## 2014-04-07 DIAGNOSIS — F329 Major depressive disorder, single episode, unspecified: Secondary | ICD-10-CM | POA: Diagnosis not present

## 2014-04-07 DIAGNOSIS — Z993 Dependence on wheelchair: Secondary | ICD-10-CM | POA: Diagnosis not present

## 2014-04-07 DIAGNOSIS — L89153 Pressure ulcer of sacral region, stage 3: Secondary | ICD-10-CM | POA: Diagnosis not present

## 2014-04-07 DIAGNOSIS — B351 Tinea unguium: Secondary | ICD-10-CM | POA: Diagnosis not present

## 2014-04-07 DIAGNOSIS — L89152 Pressure ulcer of sacral region, stage 2: Secondary | ICD-10-CM | POA: Diagnosis not present

## 2014-04-07 DIAGNOSIS — Z9181 History of falling: Secondary | ICD-10-CM | POA: Diagnosis not present

## 2014-04-07 DIAGNOSIS — G35 Multiple sclerosis: Secondary | ICD-10-CM | POA: Diagnosis not present

## 2014-04-07 DIAGNOSIS — E780 Pure hypercholesterolemia: Secondary | ICD-10-CM | POA: Diagnosis not present

## 2014-04-07 DIAGNOSIS — R7309 Other abnormal glucose: Secondary | ICD-10-CM | POA: Diagnosis not present

## 2014-04-07 DIAGNOSIS — J159 Unspecified bacterial pneumonia: Secondary | ICD-10-CM | POA: Diagnosis not present

## 2014-04-07 DIAGNOSIS — M62462 Contracture of muscle, left lower leg: Secondary | ICD-10-CM | POA: Diagnosis not present

## 2014-04-07 DIAGNOSIS — M62421 Contracture of muscle, right upper arm: Secondary | ICD-10-CM | POA: Diagnosis not present

## 2014-04-07 DIAGNOSIS — I10 Essential (primary) hypertension: Secondary | ICD-10-CM | POA: Diagnosis not present

## 2014-04-07 DIAGNOSIS — B368 Other specified superficial mycoses: Secondary | ICD-10-CM | POA: Diagnosis not present

## 2014-04-07 DIAGNOSIS — Z436 Encounter for attention to other artificial openings of urinary tract: Secondary | ICD-10-CM | POA: Diagnosis not present

## 2014-04-16 DIAGNOSIS — B351 Tinea unguium: Secondary | ICD-10-CM | POA: Diagnosis not present

## 2014-04-16 DIAGNOSIS — Z436 Encounter for attention to other artificial openings of urinary tract: Secondary | ICD-10-CM | POA: Diagnosis not present

## 2014-04-16 DIAGNOSIS — F329 Major depressive disorder, single episode, unspecified: Secondary | ICD-10-CM | POA: Diagnosis not present

## 2014-04-16 DIAGNOSIS — E780 Pure hypercholesterolemia: Secondary | ICD-10-CM | POA: Diagnosis not present

## 2014-04-16 DIAGNOSIS — M62421 Contracture of muscle, right upper arm: Secondary | ICD-10-CM | POA: Diagnosis not present

## 2014-04-16 DIAGNOSIS — R7309 Other abnormal glucose: Secondary | ICD-10-CM | POA: Diagnosis not present

## 2014-04-16 DIAGNOSIS — I10 Essential (primary) hypertension: Secondary | ICD-10-CM | POA: Diagnosis not present

## 2014-04-16 DIAGNOSIS — B368 Other specified superficial mycoses: Secondary | ICD-10-CM | POA: Diagnosis not present

## 2014-04-16 DIAGNOSIS — K219 Gastro-esophageal reflux disease without esophagitis: Secondary | ICD-10-CM | POA: Diagnosis not present

## 2014-04-16 DIAGNOSIS — M62462 Contracture of muscle, left lower leg: Secondary | ICD-10-CM | POA: Diagnosis not present

## 2014-04-16 DIAGNOSIS — L89152 Pressure ulcer of sacral region, stage 2: Secondary | ICD-10-CM | POA: Diagnosis not present

## 2014-04-16 DIAGNOSIS — G35 Multiple sclerosis: Secondary | ICD-10-CM | POA: Diagnosis not present

## 2014-04-16 DIAGNOSIS — L89153 Pressure ulcer of sacral region, stage 3: Secondary | ICD-10-CM | POA: Diagnosis not present

## 2014-04-16 DIAGNOSIS — Z9181 History of falling: Secondary | ICD-10-CM | POA: Diagnosis not present

## 2014-04-16 DIAGNOSIS — J159 Unspecified bacterial pneumonia: Secondary | ICD-10-CM | POA: Diagnosis not present

## 2014-04-16 DIAGNOSIS — Z993 Dependence on wheelchair: Secondary | ICD-10-CM | POA: Diagnosis not present

## 2014-04-16 DIAGNOSIS — M62461 Contracture of muscle, right lower leg: Secondary | ICD-10-CM | POA: Diagnosis not present

## 2014-04-22 DIAGNOSIS — I10 Essential (primary) hypertension: Secondary | ICD-10-CM | POA: Diagnosis not present

## 2014-04-22 DIAGNOSIS — R7309 Other abnormal glucose: Secondary | ICD-10-CM | POA: Diagnosis not present

## 2014-04-22 DIAGNOSIS — Z9181 History of falling: Secondary | ICD-10-CM | POA: Diagnosis not present

## 2014-04-22 DIAGNOSIS — G35 Multiple sclerosis: Secondary | ICD-10-CM | POA: Diagnosis not present

## 2014-04-22 DIAGNOSIS — M62461 Contracture of muscle, right lower leg: Secondary | ICD-10-CM | POA: Diagnosis not present

## 2014-04-22 DIAGNOSIS — B368 Other specified superficial mycoses: Secondary | ICD-10-CM | POA: Diagnosis not present

## 2014-04-22 DIAGNOSIS — J159 Unspecified bacterial pneumonia: Secondary | ICD-10-CM | POA: Diagnosis not present

## 2014-04-22 DIAGNOSIS — Z993 Dependence on wheelchair: Secondary | ICD-10-CM | POA: Diagnosis not present

## 2014-04-22 DIAGNOSIS — L89152 Pressure ulcer of sacral region, stage 2: Secondary | ICD-10-CM | POA: Diagnosis not present

## 2014-04-22 DIAGNOSIS — M62421 Contracture of muscle, right upper arm: Secondary | ICD-10-CM | POA: Diagnosis not present

## 2014-04-22 DIAGNOSIS — L89153 Pressure ulcer of sacral region, stage 3: Secondary | ICD-10-CM | POA: Diagnosis not present

## 2014-04-22 DIAGNOSIS — E780 Pure hypercholesterolemia: Secondary | ICD-10-CM | POA: Diagnosis not present

## 2014-04-22 DIAGNOSIS — F329 Major depressive disorder, single episode, unspecified: Secondary | ICD-10-CM | POA: Diagnosis not present

## 2014-04-22 DIAGNOSIS — K219 Gastro-esophageal reflux disease without esophagitis: Secondary | ICD-10-CM | POA: Diagnosis not present

## 2014-04-22 DIAGNOSIS — Z436 Encounter for attention to other artificial openings of urinary tract: Secondary | ICD-10-CM | POA: Diagnosis not present

## 2014-04-22 DIAGNOSIS — B351 Tinea unguium: Secondary | ICD-10-CM | POA: Diagnosis not present

## 2014-04-22 DIAGNOSIS — M62462 Contracture of muscle, left lower leg: Secondary | ICD-10-CM | POA: Diagnosis not present

## 2014-04-23 ENCOUNTER — Other Ambulatory Visit: Payer: Self-pay | Admitting: Family Medicine

## 2014-04-23 NOTE — Telephone Encounter (Signed)
Valium refill request.  Last seen 11/19/2013.  Last filled 03/17/2014.  Please advise.

## 2014-04-24 DIAGNOSIS — B368 Other specified superficial mycoses: Secondary | ICD-10-CM | POA: Diagnosis not present

## 2014-04-24 DIAGNOSIS — M62461 Contracture of muscle, right lower leg: Secondary | ICD-10-CM | POA: Diagnosis not present

## 2014-04-24 DIAGNOSIS — B351 Tinea unguium: Secondary | ICD-10-CM | POA: Diagnosis not present

## 2014-04-24 DIAGNOSIS — E780 Pure hypercholesterolemia: Secondary | ICD-10-CM | POA: Diagnosis not present

## 2014-04-24 DIAGNOSIS — Z993 Dependence on wheelchair: Secondary | ICD-10-CM | POA: Diagnosis not present

## 2014-04-24 DIAGNOSIS — L89153 Pressure ulcer of sacral region, stage 3: Secondary | ICD-10-CM | POA: Diagnosis not present

## 2014-04-24 DIAGNOSIS — Z9181 History of falling: Secondary | ICD-10-CM | POA: Diagnosis not present

## 2014-04-24 DIAGNOSIS — M62462 Contracture of muscle, left lower leg: Secondary | ICD-10-CM | POA: Diagnosis not present

## 2014-04-24 DIAGNOSIS — F329 Major depressive disorder, single episode, unspecified: Secondary | ICD-10-CM | POA: Diagnosis not present

## 2014-04-24 DIAGNOSIS — K219 Gastro-esophageal reflux disease without esophagitis: Secondary | ICD-10-CM | POA: Diagnosis not present

## 2014-04-24 DIAGNOSIS — J159 Unspecified bacterial pneumonia: Secondary | ICD-10-CM | POA: Diagnosis not present

## 2014-04-24 DIAGNOSIS — G35 Multiple sclerosis: Secondary | ICD-10-CM | POA: Diagnosis not present

## 2014-04-24 DIAGNOSIS — I10 Essential (primary) hypertension: Secondary | ICD-10-CM | POA: Diagnosis not present

## 2014-04-24 DIAGNOSIS — R7309 Other abnormal glucose: Secondary | ICD-10-CM | POA: Diagnosis not present

## 2014-04-24 DIAGNOSIS — M62421 Contracture of muscle, right upper arm: Secondary | ICD-10-CM | POA: Diagnosis not present

## 2014-04-24 DIAGNOSIS — Z436 Encounter for attention to other artificial openings of urinary tract: Secondary | ICD-10-CM | POA: Diagnosis not present

## 2014-04-24 DIAGNOSIS — L89152 Pressure ulcer of sacral region, stage 2: Secondary | ICD-10-CM | POA: Diagnosis not present

## 2014-04-24 NOTE — Telephone Encounter (Signed)
[facility] Dr Ermalene Searing.

## 2014-04-29 DIAGNOSIS — L89152 Pressure ulcer of sacral region, stage 2: Secondary | ICD-10-CM | POA: Diagnosis not present

## 2014-04-29 DIAGNOSIS — M62421 Contracture of muscle, right upper arm: Secondary | ICD-10-CM | POA: Diagnosis not present

## 2014-04-29 DIAGNOSIS — M62461 Contracture of muscle, right lower leg: Secondary | ICD-10-CM | POA: Diagnosis not present

## 2014-04-29 DIAGNOSIS — L89153 Pressure ulcer of sacral region, stage 3: Secondary | ICD-10-CM | POA: Diagnosis not present

## 2014-04-29 DIAGNOSIS — M62462 Contracture of muscle, left lower leg: Secondary | ICD-10-CM | POA: Diagnosis not present

## 2014-04-30 DIAGNOSIS — L89152 Pressure ulcer of sacral region, stage 2: Secondary | ICD-10-CM | POA: Diagnosis not present

## 2014-05-01 DIAGNOSIS — L89153 Pressure ulcer of sacral region, stage 3: Secondary | ICD-10-CM | POA: Diagnosis not present

## 2014-05-01 DIAGNOSIS — M62461 Contracture of muscle, right lower leg: Secondary | ICD-10-CM | POA: Diagnosis not present

## 2014-05-01 DIAGNOSIS — M62421 Contracture of muscle, right upper arm: Secondary | ICD-10-CM | POA: Diagnosis not present

## 2014-05-01 DIAGNOSIS — M62462 Contracture of muscle, left lower leg: Secondary | ICD-10-CM | POA: Diagnosis not present

## 2014-05-01 DIAGNOSIS — L89152 Pressure ulcer of sacral region, stage 2: Secondary | ICD-10-CM | POA: Diagnosis not present

## 2014-05-05 DIAGNOSIS — G35 Multiple sclerosis: Secondary | ICD-10-CM | POA: Diagnosis not present

## 2014-05-05 DIAGNOSIS — L89301 Pressure ulcer of unspecified buttock, stage 1: Secondary | ICD-10-CM | POA: Diagnosis not present

## 2014-05-05 DIAGNOSIS — M6281 Muscle weakness (generalized): Secondary | ICD-10-CM | POA: Diagnosis not present

## 2014-05-05 DIAGNOSIS — M62461 Contracture of muscle, right lower leg: Secondary | ICD-10-CM | POA: Diagnosis not present

## 2014-05-05 DIAGNOSIS — Z9181 History of falling: Secondary | ICD-10-CM | POA: Diagnosis not present

## 2014-05-05 DIAGNOSIS — L89152 Pressure ulcer of sacral region, stage 2: Secondary | ICD-10-CM | POA: Diagnosis not present

## 2014-05-05 DIAGNOSIS — M62462 Contracture of muscle, left lower leg: Secondary | ICD-10-CM | POA: Diagnosis not present

## 2014-05-05 DIAGNOSIS — L89153 Pressure ulcer of sacral region, stage 3: Secondary | ICD-10-CM | POA: Diagnosis not present

## 2014-05-05 DIAGNOSIS — L89151 Pressure ulcer of sacral region, stage 1: Secondary | ICD-10-CM | POA: Diagnosis not present

## 2014-05-05 DIAGNOSIS — M62421 Contracture of muscle, right upper arm: Secondary | ICD-10-CM | POA: Diagnosis not present

## 2014-05-06 ENCOUNTER — Other Ambulatory Visit: Payer: Self-pay | Admitting: Family Medicine

## 2014-05-07 NOTE — Telephone Encounter (Signed)
Last office visit 11/19/2013.  Ok to refill?

## 2014-05-08 DIAGNOSIS — L89153 Pressure ulcer of sacral region, stage 3: Secondary | ICD-10-CM | POA: Diagnosis not present

## 2014-05-08 DIAGNOSIS — M62461 Contracture of muscle, right lower leg: Secondary | ICD-10-CM | POA: Diagnosis not present

## 2014-05-08 DIAGNOSIS — M62421 Contracture of muscle, right upper arm: Secondary | ICD-10-CM | POA: Diagnosis not present

## 2014-05-08 DIAGNOSIS — M62462 Contracture of muscle, left lower leg: Secondary | ICD-10-CM | POA: Diagnosis not present

## 2014-05-08 DIAGNOSIS — L89152 Pressure ulcer of sacral region, stage 2: Secondary | ICD-10-CM | POA: Diagnosis not present

## 2014-05-13 ENCOUNTER — Telehealth: Payer: Self-pay | Admitting: Family Medicine

## 2014-05-13 ENCOUNTER — Ambulatory Visit: Payer: Self-pay | Admitting: Internal Medicine

## 2014-05-13 DIAGNOSIS — M62421 Contracture of muscle, right upper arm: Secondary | ICD-10-CM | POA: Diagnosis not present

## 2014-05-13 DIAGNOSIS — L89153 Pressure ulcer of sacral region, stage 3: Secondary | ICD-10-CM | POA: Diagnosis not present

## 2014-05-13 DIAGNOSIS — M62461 Contracture of muscle, right lower leg: Secondary | ICD-10-CM | POA: Diagnosis not present

## 2014-05-13 DIAGNOSIS — L89152 Pressure ulcer of sacral region, stage 2: Secondary | ICD-10-CM | POA: Diagnosis not present

## 2014-05-13 DIAGNOSIS — M62462 Contracture of muscle, left lower leg: Secondary | ICD-10-CM | POA: Diagnosis not present

## 2014-05-13 NOTE — Telephone Encounter (Signed)
Pt has severe MS and has chronic speech and  Some cognitive issues.

## 2014-05-13 NOTE — Telephone Encounter (Signed)
Patient Name: Travis Cantrell  DOB: 09/16/1960    Initial Comment [name] states he has sinus issues and has pain when he licks his lips   Nurse Assessment  Nurse: Sherilyn Cooter, RN, [name] Date/Time (Eastern Time): 05/13/2014 11:09:03 AM  Confirm and document reason for call. If symptomatic, describe symptoms. ---[name] states that he is having some sinus issues for this past week and the whole left side of his face is painful. He rates his pain as 10 on 0-10 scale. He took IBU which helped for a while. Denies fever.  Has the patient traveled out of the country within the last 30 days? ---No  Does the patient require triage? ---Yes  Related visit to physician within the last 2 weeks? ---No  Does the PT have any chronic conditions? (i.e. diabetes, asthma, etc.) ---Yes  List chronic conditions. ---HTN     Guidelines    Guideline Title Affirmed Question Affirmed Notes  Sinus Pain or Congestion [1] Sinus congestion as part of a cold AND [2] present < 10 days (all triage questions negative)    Final Disposition User   See PCP When Office is Open (within 3 days) Sherilyn Cooter, RN, [name]    Comments  [name] wants to try to see the doctor either today or tomorrow, but he has some transportation issues. He needs to get that squared away first then he will call back to schedule an appointment. No appointment scheduled at this time.

## 2014-05-13 NOTE — Telephone Encounter (Signed)
Linda at front desk said before transferred to [facility] pt seemed disorientated and slow to answer questions with slurred speech.

## 2014-05-15 DIAGNOSIS — M62421 Contracture of muscle, right upper arm: Secondary | ICD-10-CM | POA: Diagnosis not present

## 2014-05-15 DIAGNOSIS — M62461 Contracture of muscle, right lower leg: Secondary | ICD-10-CM | POA: Diagnosis not present

## 2014-05-15 DIAGNOSIS — M62462 Contracture of muscle, left lower leg: Secondary | ICD-10-CM | POA: Diagnosis not present

## 2014-05-15 DIAGNOSIS — L89153 Pressure ulcer of sacral region, stage 3: Secondary | ICD-10-CM | POA: Diagnosis not present

## 2014-05-15 DIAGNOSIS — L89152 Pressure ulcer of sacral region, stage 2: Secondary | ICD-10-CM | POA: Diagnosis not present

## 2014-05-17 NOTE — Discharge Summary (Signed)
PATIENT NAME:  Travis Cantrell, Travis Cantrell MR#:  [id number] DATE OF BIRTH:  12-02-1960  DATE OF ADMISSION:  10/29/2013 DATE OF DISCHARGE:  11/03/2013 .  PRESENTING COMPLAINT: Weakness and dehydration.  PRIMARY CARE PHYSICIAN: Excell Seltzer, M.D.   DISCHARGE DIAGNOSES: 1. Klebsiella sepsis secondary to urinary tract infection. 2. Chronic Foley associated urinary tract infection.  3. Ready upper lobe pneumonia.  4. Hypertension.  5. History of multiple sclerosis. The patient is bedbound.  CODE STATUS: Full Code.   MEDICATIONS:  1. Levaquin 750 p.o. daily. 2. Metoprolol 50 mg p.o. daily.  3. Lisinopril 10 mg daily.  4. Gabapentin 300 mg 3 capsules 3 times a day. 5. Fluoxetine 20 mg 2 tablets at bedtime.  6. Diazepam 5 mg at bedtime as needed.  7. Prilosec 20 mg b.i.d.  8. Ibuprofen 800 mg t.i.d. as needed.   FOLLOWUP APPOINTMENT: With Dr. Kerby Nora in 1 to 2 weeks.   FOLLOWUP INSTRUCTIONS: Foley care as before.   DIET: Low sodium diet.   LABORATORY DATA:  Repeat blood cultures negative. White count on admission was 49,000, on discharge is 10.1. Creatinine is 1.0.   Chest x-ray, right upper lobe pneumonia.   Creatinine on admission was 2.89.   Blood cultures on admission positive for Klebsiella pneumoniae species.   Urinalysis positive for urinary tract infection, mixed bacterial organisms.   BRIEF SUMMARY OF HOSPITAL COURSE: [name] is a 54 year old Caucasian gentleman with history of MS with a neurogenic bladder requiring chronic Foley catheter changes every 4 weeks. He is bedbound, comes into the Emergency Room with progressive weakness and found to have extreme dehydration with renal failure. He was admitted with:   1. Klebsiella sepsis likely secondary to urinary tract infection and/or right upper lobe pneumonia. Blood cultures grew Klebsiella. The patient's antibiotics were changed to IV Levaquin. White count came down from 49,000 to 10,000. Repeat blood cultures negative. The  patient remains afebrile.  2. Acute renal failure secondary to dehydration in the setting of sepsis and possibly postoperative in nature as the Foley was kinked, clogged, and improved after changing the Foley. The patient was hydrated. Creatinine on admission was 2.9, at discharge 1.1.  3. Urinary tract infection based on urinalysis, likely due to chronic indwelling Foley catheter. Urine looked very turbid with clots and clumps. The catheter was changed and the patient is improving with Levaquin. His urine cultures show mixed bacterial organism.  4. Right upper lobe pneumonia. The patient is bedbound and risk of aspiration. Repeat chest x-ray showed pneumonia and Levaquin should cover. The patient improved saturations, stable on room air. White count improved as well.  5. Accelerated hypertension with tachycardia. The patient now is on Toprol-XL and lisinopril.   Hospital stay otherwise remained stable.   TIME SPENT: 40 minutes.    ____________________________ Wylie Hail Allena Katz, MD sap:JT D: 11/03/2013 10:07:39 ET T: 11/03/2013 15:35:28 ET JOB#: [id number]  cc: Nicolina Hirt A. Allena Katz, MD, <Dictator> Excell Seltzer, MD Willow Ora MD ELECTRONICALLY SIGNED 11/11/2013 15:53
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
## 2014-05-17 NOTE — Discharge Summary (Signed)
PATIENT NAME:  Travis Cantrell, Travis Cantrell MR#:  [id number] DATE OF BIRTH:  1960/12/24  DATE OF ADMISSION:  07/08/2013 DATE OF DISCHARGE:  07/11/2013  PRESENTING COMPLAINT: Weakness and hypotension.   DISCHARGE DIAGNOSES: 1.  Sepsis due to urinary tract infection with chronic indwelling Foley catheter.  2.  Multiple sclerosis. The patient is bedbound, has chronic Foley catheter.  3.  Hypertension.   CODE STATUS: FULL.  DISCHARGE MEDICATIONS: 1.  Ceftin 500 mg b.i.d.  2.  Moxifloxacin ophthalmic drops 1 drop 3 times a day for 4 more days.  3.  Diazepam 5 mg b.i.d. as needed.  4.  Gabapentin 300 mg 3 capsules 3 times a day.  5.  Prilosec 20 mg b.i.d.  6.  Fluoxetine 20 mg 2 tablets 2 times a day.  7.  Ibuprofen 800 mg 1 tablet every 8 hours as needed.   DISCHARGE DIET: Regular.   DISCHARGE FOLLOWUP: Follow up with Dr. Kerby Nora in 1 to 2 weeks.   DIAGNOSTIC DATA: CBC within normal limits. Creatinines is 1.05. Sodium is 139. Potassium is 3.2. Creatinine on admission was 1.79.   Ultrasound of kidneys: Negative for renal obstruction.   White count on admission was 43,000.  BRIEF SUMMARY OF HOSPITAL COURSE: [name] is a pleasant 54 year old Caucasian gentleman with history of multiple sclerosis who is bedbound at baseline, has chronic indwelling Foley catheter, comes in with weakness, tachycardia, and hypotension. Was found to have urinary tract infection. He was admitted with: 1.  Sepsis. Source appears to be UTI. Has an indwelling Foley catheter and purulent material being drained in the catheter. The patient had enterococcus in the urine. He was initially was started on linezolid and Rocephin; however, his urine culture grew Klebsiella and Escherichia coli, which is pansensitive. He was changed to p.o. cefuroxime. His white count came down from 43,000 to 10,000, which was normal. He remained afebrile. He will finish up the course as outpatient.  2.  Acute renal failure, ATN from sepsis,  prerenal azotemia. Improved with IV hydration. Creatinine on admission was 1.79; at discharge it was 1.06. Avoided nephrotoxins. Creatinine stable. 3.  Left eye conjunctivitis. Moxifloxacin eye drops were prescribed.  4.  Multiple sclerosis. Continued the patient's p.r.n. muscle relaxants. He stopped following with neuro. His latest drugs also did not stop his progression.  5.  Depression. Continued fluoxetine.   Overall improved. The patient will be discharged to home. Follow up with his primary care physician as outpatient.   TIME SPENT: 40 minutes.   ____________________________ Wylie Hail Allena Katz, MD sap:sb D: 07/12/2013 06:54:42 ET T: 07/12/2013 08:32:18 ET JOB#: [id number]  cc: Sona A. Allena Katz, MD, <Dictator> Excell Seltzer, MD Willow Ora MD ELECTRONICALLY SIGNED 07/27/2013 10:22
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
## 2014-05-20 DIAGNOSIS — L89153 Pressure ulcer of sacral region, stage 3: Secondary | ICD-10-CM | POA: Diagnosis not present

## 2014-05-20 DIAGNOSIS — M62461 Contracture of muscle, right lower leg: Secondary | ICD-10-CM | POA: Diagnosis not present

## 2014-05-20 DIAGNOSIS — M62462 Contracture of muscle, left lower leg: Secondary | ICD-10-CM | POA: Diagnosis not present

## 2014-05-20 DIAGNOSIS — M62421 Contracture of muscle, right upper arm: Secondary | ICD-10-CM | POA: Diagnosis not present

## 2014-05-20 DIAGNOSIS — L89152 Pressure ulcer of sacral region, stage 2: Secondary | ICD-10-CM | POA: Diagnosis not present

## 2014-05-23 DIAGNOSIS — M62461 Contracture of muscle, right lower leg: Secondary | ICD-10-CM | POA: Diagnosis not present

## 2014-05-23 DIAGNOSIS — M62421 Contracture of muscle, right upper arm: Secondary | ICD-10-CM | POA: Diagnosis not present

## 2014-05-23 DIAGNOSIS — M62462 Contracture of muscle, left lower leg: Secondary | ICD-10-CM | POA: Diagnosis not present

## 2014-05-23 DIAGNOSIS — L89152 Pressure ulcer of sacral region, stage 2: Secondary | ICD-10-CM | POA: Diagnosis not present

## 2014-05-23 DIAGNOSIS — L89153 Pressure ulcer of sacral region, stage 3: Secondary | ICD-10-CM | POA: Diagnosis not present

## 2014-05-26 DIAGNOSIS — M62461 Contracture of muscle, right lower leg: Secondary | ICD-10-CM | POA: Diagnosis not present

## 2014-05-26 DIAGNOSIS — L89152 Pressure ulcer of sacral region, stage 2: Secondary | ICD-10-CM | POA: Diagnosis not present

## 2014-05-26 DIAGNOSIS — L89153 Pressure ulcer of sacral region, stage 3: Secondary | ICD-10-CM | POA: Diagnosis not present

## 2014-05-26 DIAGNOSIS — M62421 Contracture of muscle, right upper arm: Secondary | ICD-10-CM | POA: Diagnosis not present

## 2014-05-26 DIAGNOSIS — M62462 Contracture of muscle, left lower leg: Secondary | ICD-10-CM | POA: Diagnosis not present

## 2014-05-28 ENCOUNTER — Telehealth: Payer: Self-pay | Admitting: Family Medicine

## 2014-05-28 NOTE — Telephone Encounter (Signed)
[facility]  Called -  The would has gotten deeper. It is more difficult to pack it with dry gauze.  Wanting to see if they can change it to iobaform packing strips or plain packing strip to help the wound heal.  Best number is [phone].

## 2014-05-28 NOTE — Telephone Encounter (Signed)
Left message for Travis Cantrell with [facility] with verbal okay to change to iodoform or plain packing strip for wound care.

## 2014-05-29 DIAGNOSIS — M62462 Contracture of muscle, left lower leg: Secondary | ICD-10-CM | POA: Diagnosis not present

## 2014-05-29 DIAGNOSIS — M62461 Contracture of muscle, right lower leg: Secondary | ICD-10-CM | POA: Diagnosis not present

## 2014-05-29 DIAGNOSIS — M62421 Contracture of muscle, right upper arm: Secondary | ICD-10-CM | POA: Diagnosis not present

## 2014-05-29 DIAGNOSIS — L89152 Pressure ulcer of sacral region, stage 2: Secondary | ICD-10-CM | POA: Diagnosis not present

## 2014-05-29 DIAGNOSIS — L89153 Pressure ulcer of sacral region, stage 3: Secondary | ICD-10-CM | POA: Diagnosis not present

## 2014-06-03 DIAGNOSIS — M62421 Contracture of muscle, right upper arm: Secondary | ICD-10-CM | POA: Diagnosis not present

## 2014-06-03 DIAGNOSIS — M62461 Contracture of muscle, right lower leg: Secondary | ICD-10-CM | POA: Diagnosis not present

## 2014-06-03 DIAGNOSIS — L89153 Pressure ulcer of sacral region, stage 3: Secondary | ICD-10-CM | POA: Diagnosis not present

## 2014-06-03 DIAGNOSIS — L89152 Pressure ulcer of sacral region, stage 2: Secondary | ICD-10-CM | POA: Diagnosis not present

## 2014-06-03 DIAGNOSIS — M62462 Contracture of muscle, left lower leg: Secondary | ICD-10-CM | POA: Diagnosis not present

## 2014-06-04 DIAGNOSIS — Z9181 History of falling: Secondary | ICD-10-CM | POA: Diagnosis not present

## 2014-06-04 DIAGNOSIS — L89301 Pressure ulcer of unspecified buttock, stage 1: Secondary | ICD-10-CM | POA: Diagnosis not present

## 2014-06-04 DIAGNOSIS — M6281 Muscle weakness (generalized): Secondary | ICD-10-CM | POA: Diagnosis not present

## 2014-06-04 DIAGNOSIS — G35 Multiple sclerosis: Secondary | ICD-10-CM | POA: Diagnosis not present

## 2014-06-04 DIAGNOSIS — L89151 Pressure ulcer of sacral region, stage 1: Secondary | ICD-10-CM | POA: Diagnosis not present

## 2014-06-05 DIAGNOSIS — M62462 Contracture of muscle, left lower leg: Secondary | ICD-10-CM | POA: Diagnosis not present

## 2014-06-05 DIAGNOSIS — L89153 Pressure ulcer of sacral region, stage 3: Secondary | ICD-10-CM | POA: Diagnosis not present

## 2014-06-05 DIAGNOSIS — M62461 Contracture of muscle, right lower leg: Secondary | ICD-10-CM | POA: Diagnosis not present

## 2014-06-05 DIAGNOSIS — M62421 Contracture of muscle, right upper arm: Secondary | ICD-10-CM | POA: Diagnosis not present

## 2014-06-05 DIAGNOSIS — L89152 Pressure ulcer of sacral region, stage 2: Secondary | ICD-10-CM | POA: Diagnosis not present

## 2014-06-09 DIAGNOSIS — L89152 Pressure ulcer of sacral region, stage 2: Secondary | ICD-10-CM | POA: Diagnosis not present

## 2014-06-09 DIAGNOSIS — L89153 Pressure ulcer of sacral region, stage 3: Secondary | ICD-10-CM | POA: Diagnosis not present

## 2014-06-09 DIAGNOSIS — M62421 Contracture of muscle, right upper arm: Secondary | ICD-10-CM | POA: Diagnosis not present

## 2014-06-09 DIAGNOSIS — M62462 Contracture of muscle, left lower leg: Secondary | ICD-10-CM | POA: Diagnosis not present

## 2014-06-09 DIAGNOSIS — M62461 Contracture of muscle, right lower leg: Secondary | ICD-10-CM | POA: Diagnosis not present

## 2014-06-12 DIAGNOSIS — L89153 Pressure ulcer of sacral region, stage 3: Secondary | ICD-10-CM | POA: Diagnosis not present

## 2014-06-12 DIAGNOSIS — M62462 Contracture of muscle, left lower leg: Secondary | ICD-10-CM | POA: Diagnosis not present

## 2014-06-12 DIAGNOSIS — M62421 Contracture of muscle, right upper arm: Secondary | ICD-10-CM | POA: Diagnosis not present

## 2014-06-12 DIAGNOSIS — L89152 Pressure ulcer of sacral region, stage 2: Secondary | ICD-10-CM | POA: Diagnosis not present

## 2014-06-12 DIAGNOSIS — M62461 Contracture of muscle, right lower leg: Secondary | ICD-10-CM | POA: Diagnosis not present

## 2014-06-14 ENCOUNTER — Encounter: Payer: Self-pay | Admitting: Emergency Medicine

## 2014-06-14 ENCOUNTER — Inpatient Hospital Stay
Admission: EM | Admit: 2014-06-14 | Discharge: 2014-06-18 | DRG: 871 | Disposition: A | Discharge status: Home / Self Care | Payer: Medicare Other | Attending: Internal Medicine | Admitting: Internal Medicine

## 2014-06-14 DIAGNOSIS — R32 Unspecified urinary incontinence: Secondary | ICD-10-CM | POA: Diagnosis present

## 2014-06-14 DIAGNOSIS — I10 Essential (primary) hypertension: Secondary | ICD-10-CM | POA: Diagnosis present

## 2014-06-14 DIAGNOSIS — K219 Gastro-esophageal reflux disease without esophagitis: Secondary | ICD-10-CM | POA: Diagnosis not present

## 2014-06-14 DIAGNOSIS — G35 Multiple sclerosis: Secondary | ICD-10-CM | POA: Diagnosis not present

## 2014-06-14 DIAGNOSIS — Z9889 Other specified postprocedural states: Secondary | ICD-10-CM | POA: Diagnosis not present

## 2014-06-14 DIAGNOSIS — R61 Generalized hyperhidrosis: Secondary | ICD-10-CM | POA: Diagnosis not present

## 2014-06-14 DIAGNOSIS — T83198A Other mechanical complication of other urinary devices and implants, initial encounter: Secondary | ICD-10-CM | POA: Diagnosis not present

## 2014-06-14 DIAGNOSIS — Z8249 Family history of ischemic heart disease and other diseases of the circulatory system: Secondary | ICD-10-CM | POA: Diagnosis not present

## 2014-06-14 DIAGNOSIS — N390 Urinary tract infection, site not specified: Secondary | ICD-10-CM | POA: Diagnosis present

## 2014-06-14 DIAGNOSIS — N319 Neuromuscular dysfunction of bladder, unspecified: Secondary | ICD-10-CM | POA: Diagnosis not present

## 2014-06-14 DIAGNOSIS — Z888 Allergy status to other drugs, medicaments and biological substances status: Secondary | ICD-10-CM | POA: Diagnosis not present

## 2014-06-14 DIAGNOSIS — T8351XA Infection and inflammatory reaction due to indwelling urinary catheter, initial encounter: Secondary | ICD-10-CM | POA: Diagnosis present

## 2014-06-14 DIAGNOSIS — I9589 Other hypotension: Secondary | ICD-10-CM | POA: Diagnosis not present

## 2014-06-14 DIAGNOSIS — Z79899 Other long term (current) drug therapy: Secondary | ICD-10-CM | POA: Diagnosis not present

## 2014-06-14 DIAGNOSIS — T8189XA Other complications of procedures, not elsewhere classified, initial encounter: Secondary | ICD-10-CM | POA: Diagnosis not present

## 2014-06-14 DIAGNOSIS — Z807 Family history of other malignant neoplasms of lymphoid, hematopoietic and related tissues: Secondary | ICD-10-CM | POA: Diagnosis not present

## 2014-06-14 DIAGNOSIS — R319 Hematuria, unspecified: Secondary | ICD-10-CM | POA: Diagnosis present

## 2014-06-14 DIAGNOSIS — L89153 Pressure ulcer of sacral region, stage 3: Secondary | ICD-10-CM | POA: Diagnosis not present

## 2014-06-14 DIAGNOSIS — N368 Other specified disorders of urethra: Secondary | ICD-10-CM | POA: Diagnosis not present

## 2014-06-14 DIAGNOSIS — Z5189 Encounter for other specified aftercare: Secondary | ICD-10-CM | POA: Diagnosis not present

## 2014-06-14 DIAGNOSIS — I959 Hypotension, unspecified: Secondary | ICD-10-CM

## 2014-06-14 DIAGNOSIS — Z833 Family history of diabetes mellitus: Secondary | ICD-10-CM | POA: Diagnosis not present

## 2014-06-14 DIAGNOSIS — T83511A Infection and inflammatory reaction due to indwelling urethral catheter, initial encounter: Secondary | ICD-10-CM

## 2014-06-14 DIAGNOSIS — A419 Sepsis, unspecified organism: Secondary | ICD-10-CM | POA: Diagnosis not present

## 2014-06-14 DIAGNOSIS — T8389XA Other specified complication of genitourinary prosthetic devices, implants and grafts, initial encounter: Secondary | ICD-10-CM | POA: Diagnosis not present

## 2014-06-14 DIAGNOSIS — Y846 Urinary catheterization as the cause of abnormal reaction of the patient, or of later complication, without mention of misadventure at the time of the procedure: Secondary | ICD-10-CM | POA: Diagnosis present

## 2014-06-14 DIAGNOSIS — N3289 Other specified disorders of bladder: Secondary | ICD-10-CM | POA: Diagnosis present

## 2014-06-14 HISTORY — DX: Multiple sclerosis: G35

## 2014-06-14 LAB — CBC WITH DIFFERENTIAL/PLATELET
BASOS PCT: 0 %
Basophils Absolute: 0.1 10*3/uL (ref 0–0.1)
EOS PCT: 0 %
Eosinophils Absolute: 0.1 10*3/uL (ref 0–0.7)
HCT: 43.9 % (ref 40.0–52.0)
HEMOGLOBIN: 13.7 g/dL (ref 13.0–18.0)
Lymphocytes Relative: 6 %
Lymphs Abs: 1.1 10*3/uL (ref 1.0–3.6)
MCH: 27.6 pg (ref 26.0–34.0)
MCHC: 31.2 g/dL — ABNORMAL LOW (ref 32.0–36.0)
MCV: 88.4 fL (ref 80.0–100.0)
Monocytes Absolute: 0.3 10*3/uL (ref 0.2–1.0)
Monocytes Relative: 2 %
Neutro Abs: 17.9 10*3/uL — ABNORMAL HIGH (ref 1.4–6.5)
Neutrophils Relative %: 92 %
Platelets: 412 10*3/uL (ref 150–440)
RBC: 4.97 MIL/uL (ref 4.40–5.90)
RDW: 14.4 % (ref 11.5–14.5)
WBC: 19.4 10*3/uL — ABNORMAL HIGH (ref 3.8–10.6)

## 2014-06-14 LAB — BASIC METABOLIC PANEL
Anion gap: 11 (ref 5–15)
BUN: 25 mg/dL — ABNORMAL HIGH (ref 6–20)
CALCIUM: 8.6 mg/dL — AB (ref 8.9–10.3)
CO2: 24 mmol/L (ref 22–32)
CREATININE: 1.61 mg/dL — AB (ref 0.61–1.24)
Chloride: 105 mmol/L (ref 101–111)
GFR calc Af Amer: 54 mL/min — ABNORMAL LOW (ref >60)
GFR calc non Af Amer: 47 mL/min — ABNORMAL LOW (ref >60)
Glucose, Bld: 220 mg/dL — ABNORMAL HIGH (ref 65–99)
POTASSIUM: 4 mmol/L (ref 3.5–5.1)
Sodium: 140 mmol/L (ref 135–145)

## 2014-06-14 LAB — URINALYSIS COMPLETE WITH MICROSCOPIC (ARMC ONLY)
Bilirubin Urine: NEGATIVE
Glucose, UA: NEGATIVE mg/dL
Ketones, ur: NEGATIVE mg/dL
NITRITE: NEGATIVE
PROTEIN: 100 mg/dL — AB
SPECIFIC GRAVITY, URINE: 1.009 (ref 1.005–1.030)
SQUAMOUS EPITHELIAL / LPF: NONE SEEN
pH: 6 (ref 5.0–8.0)

## 2014-06-14 MED ORDER — DEXTROSE 5 % IV SOLN
INTRAVENOUS | Status: AC
  Administered 2014-06-14: 23:00:00
  Filled 2014-06-14: qty 10

## 2014-06-14 MED ORDER — CEFTRIAXONE SODIUM IN DEXTROSE 40 MG/ML IV SOLN
2.0000 g | Freq: Once | INTRAVENOUS | Status: AC
  Administered 2014-06-14: 2 g via INTRAVENOUS

## 2014-06-14 MED ORDER — ACETAMINOPHEN 650 MG RE SUPP
650.0000 mg | Freq: Once | RECTAL | Status: AC
  Administered 2014-06-14: 650 mg via RECTAL

## 2014-06-14 MED ORDER — DIPHENHYDRAMINE HCL 50 MG/ML IJ SOLN
25.0000 mg | Freq: Once | INTRAMUSCULAR | Status: AC
  Administered 2014-06-14: 25 mg via INTRAVENOUS

## 2014-06-14 MED ORDER — SODIUM CHLORIDE 0.9 % IV SOLN: 10.0000 mL/h | Freq: Once | INTRAVENOUS | Status: DC

## 2014-06-14 MED ORDER — ONDANSETRON HCL 4 MG/2ML IJ SOLN
4.0000 mg | Freq: Once | INTRAMUSCULAR | Status: AC
  Administered 2014-06-14: 4 mg via INTRAVENOUS

## 2014-06-14 MED ORDER — ONDANSETRON HCL 4 MG/2ML IJ SOLN
INTRAMUSCULAR | Status: AC
  Administered 2014-06-14: 4 mg via INTRAVENOUS
  Filled 2014-06-14: qty 2

## 2014-06-14 NOTE — ED Notes (Signed)
Report to lauryn, rn. Pt placed on cont pox and blood pressure to cycle every 30 minutes. Blankets provided.

## 2014-06-14 NOTE — ED Notes (Signed)
Ems states pt was having foley replaced, pt with bleeding noted from penis. Ems states approx 1.5 liters of blood noted from penis.

## 2014-06-14 NOTE — ED Notes (Signed)
Blood cultures obtained after administration of antibiotics due to emergent situation. MD Carollee Massed verbalized okay to begin antibiotics prior to obtaining cultures due to situation.
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
## 2014-06-14 NOTE — ED Notes (Signed)
Antibiotics started at this time prior to cultures drawn due to verbal order from MD [facility] to give antibiotics at this time.

## 2014-06-14 NOTE — ED Notes (Signed)
Urology, Dr. Lynnae Sandhoff, inserted foley cath at bedside.

## 2014-06-14 NOTE — ED Notes (Signed)
Dr kaminski at bedside.  

## 2014-06-14 NOTE — Consult Note (Signed)
Urology Consult  Consulting [id number]  CC: Bleeding from penis, hypotension  HPI: This is a 54 year old male with long-standing history of multiple sclerosis. Apparently, he has a neurogenic bladder. He has had an indwelling Foley catheter for quite a while. Apparently, he had his catheter changed earlier today. According to Dr. Thomasene Lot, the patient's aide tried changing the catheter, and apparently the balloon was inflated prior to reaching the bladder. There was significant bleeding, and EMS was called. By the time they arrived, the patient had lost a significant amount of blood from his penis. The catheter was not in place. He was transported to the emergency room. He was still bleeding somewhat, although a lessened amount by the time he arrived to the emergency room. I was consulted, and recommended replacing a catheter to help drain the bladder and perhaps tamponade the bleeding. Prior to the catheter being replaced, the ER staff noted the patient to be hypotensive and tachycardic. By that time, the bleeding had stopped. The patient was placed on a sepsis protocol. I then was re-consulted for catheter placement, which was apparently difficult.  The patient is not regularly followed by a urologist. He is followed by home health care, who follows a sacral decubitus ulcer.  PMH: Past Medical History  Diagnosis Date  . Allergy   . GERD (gastroesophageal reflux disease)   . MS (multiple sclerosis)     PSH: Past Surgical History  Procedure Laterality Date  . Vein reconsruction    . Rotator cuff repair      Allergies: Allergies  Allergen Reactions  . Baclofen Diarrhea    Medications:  (Not in a hospital admission)   Social History: History   Social History  . Marital Status: Single    Spouse Name: N/A  . Number of Children: 1  . Years of Education: N/A   Occupational History  . disabled    Social History Main Topics  . Smoking status: Never Smoker   . Smokeless  tobacco: Never Used  . Alcohol Use: Yes     Comment: occassional beer  . Drug Use: No  . Sexual Activity: Not on file   Other Topics Concern  . Not on file   Social History Narrative   Patient has 12 brothers and sister      Regular exercise trys to walks as tolerated      Diet poor, limited fruits and veggies, lots of water    Family History: Family History  Problem Relation Age of Onset  . Cancer Mother     multiple myeloma  . Diabetes Father   . Heart attack Father     Review of Systems: Positive: Multiple sequelae from long-standing multiple sclerosis. Blood from penis. Weakness. Negative:  A further 10 point review of systems was negative except what is listed in the HPI.  Physical Exam: '@VITALS2' @ General: Patient appears chronically ill, has quadruped assist. He is responsive to questions and appropriate. Head:  Normocephalic.  Atraumatic. Mucous membranes were dry. ENT:  EOMI.   Neck:  Supple.  No lymphadenopathy.. Pulmonary: Equal effort bilaterally.   Abdomen: Soft. Non-tender to palpation. Skin:  Normal turgor.  No visible rash. I did not examine the patient's posterior region Extremity: No gross deformity of bilateral upper extremities.  No gross deformity of    bilateral lower extremities. Neurologic: Alert. Appropriate mood. Quadruped or cysts noted  Penis:  circumcised.  No lesions. No blood at meatus Urethra: No Foley catheter in place.  Orthotopic meatus. Scrotum:  No lesions.  No ecchymosis.  No erythema. Testicles: Descended bilaterally.  No masses bilaterally. Epididymis: Palpable bilaterally.  Non Tender to palpation.  Studies:  Recent Labs     06/14/14  2147  HGB  13.7  WBC  19.4*  PLT  412    Recent Labs     06/14/14  2147  NA  140  K  4.0  CL  105  CO2  24  BUN  25*  CREATININE  1.61*  CALCIUM  8.6*  GFRNONAA  47*  GFRAA  54*     No results for input(s): INR, APTT in the last 72 hours.  Invalid input(s): PT   Invalid  input(s): ABG  After sterile prep and drape, and placing 20 mL of surgical lubricant in the urethra, an 68 French coud-tip catheter was passed without difficulty. Significant amount of lightly bloody urine was obtained. No clots were noted. Urine was sent for culture.  Assessment:  Urethral trauma with bleeding, which has stopped. Catheter has been replaced  Probable urinary tract infection with sepsis secondary to urethral trauma  Plan: I'll leave this catheter in for about a month. Irrigate when necessary.  He will obviously need to be admitted for management of probable sepsis  I would suggest that the patient's aide learn proper catheter replacement techniques prior to having the patient go home  If further urologic assistance needed during the patient's hospitalization, please contact us    Pager:[phone]

## 2014-06-15 DIAGNOSIS — R32 Unspecified urinary incontinence: Secondary | ICD-10-CM | POA: Diagnosis present

## 2014-06-15 DIAGNOSIS — L89153 Pressure ulcer of sacral region, stage 3: Secondary | ICD-10-CM | POA: Diagnosis present

## 2014-06-15 DIAGNOSIS — Y846 Urinary catheterization as the cause of abnormal reaction of the patient, or of later complication, without mention of misadventure at the time of the procedure: Secondary | ICD-10-CM | POA: Diagnosis present

## 2014-06-15 DIAGNOSIS — K219 Gastro-esophageal reflux disease without esophagitis: Secondary | ICD-10-CM | POA: Diagnosis present

## 2014-06-15 DIAGNOSIS — Z833 Family history of diabetes mellitus: Secondary | ICD-10-CM | POA: Diagnosis not present

## 2014-06-15 DIAGNOSIS — Z79899 Other long term (current) drug therapy: Secondary | ICD-10-CM | POA: Diagnosis not present

## 2014-06-15 DIAGNOSIS — N390 Urinary tract infection, site not specified: Secondary | ICD-10-CM | POA: Diagnosis present

## 2014-06-15 DIAGNOSIS — R319 Hematuria, unspecified: Secondary | ICD-10-CM | POA: Diagnosis present

## 2014-06-15 DIAGNOSIS — A419 Sepsis, unspecified organism: Secondary | ICD-10-CM | POA: Diagnosis present

## 2014-06-15 DIAGNOSIS — Z9889 Other specified postprocedural states: Secondary | ICD-10-CM | POA: Diagnosis not present

## 2014-06-15 DIAGNOSIS — N319 Neuromuscular dysfunction of bladder, unspecified: Secondary | ICD-10-CM | POA: Diagnosis present

## 2014-06-15 DIAGNOSIS — I10 Essential (primary) hypertension: Secondary | ICD-10-CM | POA: Diagnosis present

## 2014-06-15 DIAGNOSIS — Z888 Allergy status to other drugs, medicaments and biological substances status: Secondary | ICD-10-CM | POA: Diagnosis not present

## 2014-06-15 DIAGNOSIS — N3289 Other specified disorders of bladder: Secondary | ICD-10-CM | POA: Diagnosis present

## 2014-06-15 DIAGNOSIS — T8351XA Infection and inflammatory reaction due to indwelling urinary catheter, initial encounter: Secondary | ICD-10-CM | POA: Diagnosis present

## 2014-06-15 DIAGNOSIS — R61 Generalized hyperhidrosis: Secondary | ICD-10-CM | POA: Diagnosis present

## 2014-06-15 DIAGNOSIS — Z807 Family history of other malignant neoplasms of lymphoid, hematopoietic and related tissues: Secondary | ICD-10-CM | POA: Diagnosis not present

## 2014-06-15 DIAGNOSIS — Z8249 Family history of ischemic heart disease and other diseases of the circulatory system: Secondary | ICD-10-CM | POA: Diagnosis not present

## 2014-06-15 DIAGNOSIS — G35 Multiple sclerosis: Secondary | ICD-10-CM | POA: Diagnosis present

## 2014-06-15 LAB — CBC
HCT: 38.4 % — ABNORMAL LOW (ref 40.0–52.0)
Hemoglobin: 12.3 g/dL — ABNORMAL LOW (ref 13.0–18.0)
MCH: 27.5 pg (ref 26.0–34.0)
MCHC: 32.1 g/dL (ref 32.0–36.0)
MCV: 85.6 fL (ref 80.0–100.0)
Platelets: 257 10*3/uL (ref 150–440)
RBC: 4.48 MIL/uL (ref 4.40–5.90)
RDW: 14.8 % — ABNORMAL HIGH (ref 11.5–14.5)
WBC: 53.2 10*3/uL (ref 3.8–10.6)

## 2014-06-15 LAB — CREATININE, SERUM
Creatinine, Ser: 1.87 mg/dL — ABNORMAL HIGH (ref 0.61–1.24)
GFR calc Af Amer: 45 mL/min — ABNORMAL LOW (ref >60)
GFR calc non Af Amer: 39 mL/min — ABNORMAL LOW (ref >60)

## 2014-06-15 LAB — ABO/RH: ABO/RH(D): O NEG

## 2014-06-15 MED ORDER — SODIUM CHLORIDE 0.9 % IV SOLN
INTRAVENOUS | Status: DC
  Administered 2014-06-15 – 2014-06-16 (×2): via INTRAVENOUS

## 2014-06-15 MED ORDER — ACETAMINOPHEN 325 MG PO TABS
650.0000 mg | ORAL_TABLET | Freq: Four times a day (QID) | ORAL | Status: DC | PRN
  Administered 2014-06-15 – 2014-06-16 (×2): 650 mg via ORAL
  Filled 2014-06-15 (×2): qty 2

## 2014-06-15 MED ORDER — DOCUSATE SODIUM 100 MG PO CAPS
100.0000 mg | ORAL_CAPSULE | Freq: Two times a day (BID) | ORAL | Status: DC
  Administered 2014-06-15 – 2014-06-18 (×8): 100 mg via ORAL
  Filled 2014-06-15 (×8): qty 1

## 2014-06-15 MED ORDER — METOPROLOL TARTRATE 25 MG PO TABS
12.5000 mg | ORAL_TABLET | Freq: Two times a day (BID) | ORAL | Status: DC
  Administered 2014-06-15 – 2014-06-16 (×3): 12.5 mg via ORAL
  Filled 2014-06-15 (×4): qty 1

## 2014-06-15 MED ORDER — FLUOXETINE HCL 20 MG PO CAPS
40.0000 mg | ORAL_CAPSULE | Freq: Every day | ORAL | Status: DC
  Administered 2014-06-15 – 2014-06-18 (×4): 40 mg via ORAL
  Filled 2014-06-15 (×4): qty 2

## 2014-06-15 MED ORDER — SODIUM CHLORIDE 0.9 % IV BOLUS (SEPSIS)
1000.0000 mL | Freq: Once | INTRAVENOUS | Status: AC
  Administered 2014-06-15: 1000 mL via INTRAVENOUS

## 2014-06-15 MED ORDER — VANCOMYCIN HCL 10 G IV SOLR
1250.0000 mg | Freq: Once | INTRAVENOUS | Status: AC
  Administered 2014-06-15: 1250 mg via INTRAVENOUS
  Filled 2014-06-15: qty 1250

## 2014-06-15 MED ORDER — NYSTATIN 100000 UNIT/GM EX POWD
Freq: Three times a day (TID) | CUTANEOUS | Status: DC
  Administered 2014-06-15 – 2014-06-18 (×10): via TOPICAL
  Filled 2014-06-15 (×3): qty 15

## 2014-06-15 MED ORDER — ACETAMINOPHEN 650 MG RE SUPP: 650.0000 mg | Freq: Four times a day (QID) | RECTAL | Status: DC | PRN

## 2014-06-15 MED ORDER — HEPARIN SODIUM (PORCINE) 5000 UNIT/ML IJ SOLN
5000.0000 [IU] | Freq: Three times a day (TID) | INTRAMUSCULAR | Status: DC
  Administered 2014-06-15 – 2014-06-18 (×11): 5000 [IU] via SUBCUTANEOUS
  Filled 2014-06-15 (×11): qty 1

## 2014-06-15 MED ORDER — VANCOMYCIN HCL IN DEXTROSE 1-5 GM/200ML-% IV SOLN
1000.0000 mg | INTRAVENOUS | Status: DC
  Administered 2014-06-15 – 2014-06-17 (×3): 1000 mg via INTRAVENOUS
  Filled 2014-06-15 (×5): qty 200

## 2014-06-15 NOTE — Progress Notes (Signed)
[facility] [place] [facility]   PATIENT NAME: Travis Cantrell    MR#:  [id number]  DATE OF BIRTH:  1960/10/13  SUBJECTIVE:  CHIEF COMPLAINT:  Presented with bleeding from penis Denies any other episodes of bleeding this a.m. Resting comfortably. Denies any pain. Denies any dizziness  REVIEW OF SYSTEMS:  CONSTITUTIONAL: No fever, fatigue . reporting weakness.  EYES: No blurred or double vision.  EARS, NOSE, AND THROAT: No tinnitus or ear pain.  RESPIRATORY: No cough, shortness of breath, wheezing or hemoptysis.  CARDIOVASCULAR: No chest pain, orthopnea, edema.  GASTROINTESTINAL: No nausea, vomiting, diarrhea or abdominal pain.  GENITOURINARY: No dysuria, hematuria.  ENDOCRINE: No polyuria, nocturia,  HEMATOLOGY: No anemia, easy bruising or bleeding SKIN: No rash or lesion. MUSCULOSKELETAL: No joint pain or arthritis.   NEUROLOGIC: No tingling, numbness, weakness.  PSYCHIATRY: No anxiety or depression.   DRUG ALLERGIES:   Allergies  Allergen Reactions  . Baclofen Diarrhea    VITALS:  Blood pressure 72/59, pulse 78, temperature 99.1 F (37.3 C), temperature source Oral, resp. rate 17, height 5\' 10"  (1.778 m), weight 87.5 kg (192 lb 14.4 oz), SpO2 97 %.  PHYSICAL EXAMINATION:  GENERAL:  54 y.o.-year-old patient lying in the bed with no acute distress.  EYES: Pupils equal, round, reactive to light and accommodation. No scleral icterus. Extraocular muscles intact.  HEENT: Head atraumatic, normocephalic. Oropharynx and nasopharynx clear.  NECK:  Supple, no jugular venous distention. No thyroid enlargement, no tenderness.  LUNGS: Normal breath sounds bilaterally, no wheezing, rales,rhonchi or crepitation. No use of accessory muscles of respiration.  CARDIOVASCULAR: S1, S2 normal. No murmurs, rubs, or gallops.  ABDOMEN: Soft, nontender, nondistended. Bowel sounds present. No organomegaly or mass.  EXTREMITIES: No pedal edema, cyanosis, or clubbing.   NEUROLOGIC: Cranial nerves II through XII are intact.  Sensation intact. Gait not checked.  PSYCHIATRIC: The patient is alert and oriented x 3.  SKIN: No obvious rash, lesion, or ulcer.    LABORATORY PANEL:   CBC  Recent Labs Lab 06/15/14 0748  WBC 53.2*  HGB 12.3*  HCT 38.4*  PLT 257   ------------------------------------------------------------------------------------------------------------------  Chemistries   Recent Labs Lab 06/14/14 2147 06/15/14 0748  NA 140  --   K 4.0  --   CL 105  --   CO2 24  --   GLUCOSE 220*  --   BUN 25*  --   CREATININE 1.61* 1.87*  CALCIUM 8.6*  --    ------------------------------------------------------------------------------------------------------------------  Cardiac Enzymes No results for input(s): TROPONINI in the last 168 hours. ------------------------------------------------------------------------------------------------------------------  RADIOLOGY:  No results found.  EKG:  No orders found for this or any previous visit.  ASSESSMENT AND PLAN:   This is a 54 year old male with multiple sclerosis admitted for sepsis secondary to urinary tract infection. 1. Sepsis: Meets criteria via leukocytosis, fever and tachycardia. The patient is currently hemodynamically unstable with hypotension,  blood pressure at 81/53 and 72/59. We will provide stat IV fluid bolus with 1 L normal saline and continue IV fluids at 125 cc/h. Will provide pressors when necessary if there is no improvement in blood pressure to maintain  map at or greater than 65 Broad-spectrum antibiotics started in the emergency department; will continue the same while pending cultures, follow cultures for susceptibilities.  2. Urinary tract infection: Present on admission. The patient has received Rocephin as part of his antibiotic coverage in addition to vancomycin. If he does not appear to clear his urinary tract infection in a  timely fashion we should cover  pseudomonal organisms as the patient has had an indwelling Foley catheter.  3. Multiple sclerosis: Stable  4. DVT prophylaxis: Heparin subcutaneously. Will discontinue if patient restarts bleeding  5. GI prophylaxis: H2 blocker as needed for gastroesophageal reflux disease     All the records are reviewed and case discussed with Care Management/Social Workerr. Management plans discussed with the patient, family and they are in agreement.  CODE STATUS: Full  TOTAL CRITICAL CARE TIME TAKING CARE OF THIS PATIENT: 40 minutes.   POSSIBLE D/C IN 2-3 DAYS, DEPENDING ON CLINICAL CONDITION.   Ramonita Lab M.D on 06/15/2014 at 12:16 PM  Between 7am to 6pm - Pager - [phone] After 6pm go to [email or web address] - password [email or web address] [facility]  [place] [facility]  Office  [phone]  CC: Primary care physician; Kerby Nora, MD

## 2014-06-15 NOTE — H&P (Addendum)
Travis Cantrell is an 54 y.o. male.   Chief Complaint: Penile bleeding HPI: The patient presents to the emergency department via EMS after complaining of excessive bleeding from his penis. This occurred after his caretaker attempted to catheterize the patient. EMS reported a basin filled with blood at the patient's bedside in his home.  In the emergency department urology was called to achieve hemostasis. Sometime following this procedure the patient's blood pressure dropped dramatically and he became diaphoretic. Due to his reported blood loss the emergency department treatment team transfuse 1 unit of O- blood. The patient was subsequently found to have a normal hemoglobin. Spectrum antibiotics were initiated after blood and urine cultures were obtained in the emergency department staff called for admission.  Past Medical History  Diagnosis Date  . Allergy   . GERD (gastroesophageal reflux disease)   . MS (multiple sclerosis)     Past Surgical History  Procedure Laterality Date  . Vein reconsruction    . Rotator cuff repair      Family History  Problem Relation Age of Onset  . Cancer Mother     multiple myeloma  . Diabetes Father   . Heart attack Father    Social History:  reports that he has never smoked. He has never used smokeless tobacco. He reports that he drinks alcohol. He reports that he does not use illicit drugs.  Allergies:  Allergies  Allergen Reactions  . Baclofen Diarrhea    Prior to Admission medications   Medication Sig Start Date End Date Taking? Authorizing Provider  atorvastatin (LIPITOR) 40 MG tablet Take 1 tablet (40 mg total) by mouth daily. 02/04/14   Amy Cletis Athens, MD  Cholecalciferol (VITAMIN D-3 PO) Take by mouth daily.    Historical Provider, MD  diazepam (VALIUM) 5 MG tablet TAKE ONE TABLET BY MOUTH EVERY 12 HOURS AS NEEDED 04/24/14   Amy E Diona Browner, MD  FLUoxetine (PROZAC) 20 MG capsule TAKE TWO CAPSULES BY MOUTH IN THE MORNING AND ONE IN THE EVENING  03/11/14   Amy E Bedsole, MD  gabapentin (NEURONTIN) 300 MG capsule Take 3 capsules (900 mg total) by mouth 3 (three) times daily. 09/10/10   Amy Cletis Athens, MD  gabapentin (NEURONTIN) 300 MG capsule TAKE THREE CAPSULES BY MOUTH THREE TIMES DAILY 05/07/14   Amy Cletis Athens, MD  ibuprofen (ADVIL,MOTRIN) 800 MG tablet TAKE ONE TABLET BY MOUTH EVERY 8 HOURS AS NEEDED 02/07/14   Amy Cletis Athens, MD  ibuprofen (ADVIL,MOTRIN) 800 MG tablet TAKE ONE TABLET BY MOUTH EVERY 8 HOURS AS NEEDED 05/07/14   Amy Cletis Athens, MD  lisinopril (PRINIVIL,ZESTRIL) 10 MG tablet Take 1 tablet (10 mg total) by mouth daily. 11/29/13   Amy Cletis Athens, MD  metoprolol succinate (TOPROL-XL) 25 MG 24 hr tablet Take 1 tablet (25 mg total) by mouth daily. Take with or immediately following a meal. 02/04/14   Amy E Diona Browner, MD  nystatin (MYCOSTATIN/NYSTOP) 100000 UNIT/GM POWD Apply to rash four times daily 01/29/14   Owens Loffler, MD  omeprazole (PRILOSEC) 20 MG capsule TAKE ONE CAPSULE BY MOUTH TWICE DAILY 01/02/14   Jinny Sanders, MD     Results for orders placed or performed during the hospital encounter of 06/14/14 (from the past 48 hour(s))  Urinalysis complete, with microscopic     Status: Abnormal   Collection Time: 06/14/14  9:47 PM  Result Value Ref Range   Color, Urine RED (A) YELLOW   APPearance HAZY (A) CLEAR   Glucose, UA  NEGATIVE NEGATIVE mg/dL   Bilirubin Urine NEGATIVE NEGATIVE   Ketones, ur NEGATIVE NEGATIVE mg/dL   Specific Gravity, Urine 1.009 1.005 - 1.030   Hgb urine dipstick 2+ (A) NEGATIVE   pH 6.0 5.0 - 8.0   Protein, ur 100 (A) NEGATIVE mg/dL   Nitrite NEGATIVE NEGATIVE   Leukocytes, UA TRACE (A) NEGATIVE   RBC / HPF TOO NUMEROUS TO COUNT 0 - 5 RBC/hpf   WBC, UA TOO NUMEROUS TO COUNT 0 - 5 WBC/hpf   Bacteria, UA MANY (A) NONE SEEN   Squamous Epithelial / LPF NONE SEEN NONE SEEN  CBC with Differential     Status: Abnormal   Collection Time: 06/14/14  9:47 PM  Result Value Ref Range   WBC 19.4 (H) 3.8 -  10.6 K/uL   RBC 4.97 4.40 - 5.90 MIL/uL   Hemoglobin 13.7 13.0 - 18.0 g/dL   HCT 43.9 40.0 - 52.0 %   MCV 88.4 80.0 - 100.0 fL   MCH 27.6 26.0 - 34.0 pg   MCHC 31.2 (L) 32.0 - 36.0 g/dL   RDW 14.4 11.5 - 14.5 %   Platelets 412 150 - 440 K/uL   Neutrophils Relative % 92 %   Neutro Abs 17.9 (H) 1.4 - 6.5 K/uL   Lymphocytes Relative 6 %   Lymphs Abs 1.1 1.0 - 3.6 K/uL   Monocytes Relative 2 %   Monocytes Absolute 0.3 0.2 - 1.0 K/uL   Eosinophils Relative 0 %   Eosinophils Absolute 0.1 0 - 0.7 K/uL   Basophils Relative 0 %   Basophils Absolute 0.1 0 - 0.1 K/uL  Basic metabolic panel     Status: Abnormal   Collection Time: 06/14/14  9:47 PM  Result Value Ref Range   Sodium 140 135 - 145 mmol/L   Potassium 4.0 3.5 - 5.1 mmol/L   Chloride 105 101 - 111 mmol/L   CO2 24 22 - 32 mmol/L   Glucose, Bld 220 (H) 65 - 99 mg/dL   BUN 25 (H) 6 - 20 mg/dL   Creatinine, Ser 1.61 (H) 0.61 - 1.24 mg/dL   Calcium 8.6 (L) 8.9 - 10.3 mg/dL   GFR calc non Af Amer 47 (L) >60 mL/min   GFR calc Af Amer 54 (L) >60 mL/min    Comment: (NOTE) The eGFR has been calculated using the CKD EPI equation. This calculation has not been validated in all clinical situations. eGFR's persistently <60 mL/min signify possible Chronic Kidney Disease.    Anion gap 11 5 - 15  Type and screen for Red Blood Exchange     Status: None (Preliminary result)   Collection Time: 06/14/14  9:47 PM  Result Value Ref Range   ABO/RH(D) O NEG    Antibody Screen NEG    Sample Expiration 06/17/2014    Unit Number [id number]    Blood Component Type RBC, LR IRR    Unit division 00    Status of Unit ALLOCATED    Transfusion Status OK TO TRANSFUSE    Crossmatch Result Compatible    Unit Number [id number]    Blood Component Type RBC LR PHER1    Unit division 00    Status of Unit ISSUED    Transfusion Status OK TO TRANSFUSE    Crossmatch Result COMPATIBLE    Unit Number [id number]    Blood Component Type RBC, LR IRR     Unit division 00    Status of Unit ALLOCATED    Transfusion Status OK  TO TRANSFUSE    Crossmatch Result Compatible   ABO/Rh     Status: None   Collection Time: 06/14/14  9:47 PM  Result Value Ref Range   ABO/RH(D) O NEG    No results found.  Review of Systems  Constitutional: Negative for fever and chills.  HENT: Negative for sore throat and tinnitus.   Eyes: Negative for blurred vision and redness.  Respiratory: Negative for cough and shortness of breath.   Cardiovascular: Negative for chest pain, palpitations, orthopnea and PND.  Gastrointestinal: Negative for nausea, vomiting, abdominal pain and diarrhea.  Genitourinary: Negative for dysuria, urgency and frequency.  Musculoskeletal: Negative for myalgias and joint pain.  Skin: Negative for rash.       No lesions  Neurological: Negative for speech change, focal weakness and weakness.  Endo/Heme/Allergies: Does not bruise/bleed easily.       No temperature intolerance  Psychiatric/Behavioral: Negative for depression and suicidal ideas.    Blood pressure 123/93, pulse 117, temperature 100.6 F (38.1 C), temperature source Oral, resp. rate 23, weight 87.544 kg (193 lb), SpO2 100 %. Physical Exam  Nursing note and vitals reviewed. Constitutional: He is oriented to person, place, and time. He appears well-developed and well-nourished.  HENT:  Head: Normocephalic and atraumatic.  Mouth/Throat: Mucous membranes are dry. No oropharyngeal exudate.  Eyes: Conjunctivae and EOM are normal. Pupils are equal, round, and reactive to light. No scleral icterus.  Neck: Normal range of motion. Neck supple. No JVD present. No tracheal deviation present. No thyromegaly present.  Cardiovascular: Regular rhythm, normal heart sounds and intact distal pulses.  Tachycardia present.  Exam reveals no gallop and no friction rub.   No murmur heard. Respiratory: Effort normal and breath sounds normal.  GI: Soft. Bowel sounds are normal. He exhibits no  distension. There is no tenderness.  Genitourinary: Penis normal.  Foley in place  Musculoskeletal: He exhibits no edema.  Contractures present right wrist and fingers also left ankle and right lower extremity  Lymphadenopathy:    He has no cervical adenopathy.  Neurological: He is alert and oriented to person, place, and time. He displays atrophy. No cranial nerve deficit or sensory deficit.  B/l lower extremity paralysis  Skin: He is diaphoretic.     Assessment/Plan This is a 54 year old male with multiple sclerosis admitted for sepsis secondary to urinary tract infection. 1. Sepsis: Meets criteria via leukocytosis, fever and tachycardia. The patient is currently hemodynamically stable. Initially it seemed that he may be in shock but this may have been a vagal response or autonomic dysfunction associated with his multiple sclerosis as his vital signs appear to have stabilized rapidly.  Broad-spectrum antibiotics started in the emergency department; follow cultures for susceptibilities. 2. Urinary tract infection: Present on admission. The patient has received Rocephin as part of his antibiotic coverage in addition to vancomycin. If he does not appear to clear his urinary tract infection in a timely fashion we should cover pseudomonal organisms as the patient has had an indwelling Foley catheter. 3. Multiple sclerosis: Stable 4. DVT prophylaxis: Heparin 5. GI prophylaxis: H2 blocker as needed for gastroesophageal reflux disease The patient is a full code. Time spent on admission orders and critical patient care approximately 45 minutes  Harrie Foreman 06/15/2014, 12:39 AM

## 2014-06-15 NOTE — Progress Notes (Signed)
ANTIBIOTIC CONSULT NOTE - INITIAL  Pharmacy Consult for vancomycin dosing Indication: UTI  Allergies  Allergen Reactions  . Baclofen Diarrhea    Patient Measurements: Height: 5\' 10"  (177.8 cm) Weight: 192 lb 14.4 oz (87.5 kg) IBW/kg (Calculated) : 73 Adjusted Body Weight: 87.5  Vital Signs: Temp: 101.2 F (38.4 C) (05/22 0200) Temp Source: Oral (05/22 0200) BP: 95/72 mmHg (05/22 0500) Pulse Rate: 100 (05/22 0500) Intake/Output from previous day: 05/21 0701 - 05/22 0700 In: 1145 [I.V.:630; Blood:340] Out: 1475 [Urine:1475] Intake/Output from this shift: Total I/O In: 1145 [I.V.:630; Blood:340; Other:175] Out: 1475 [Urine:1475]  Labs:  Recent Labs  06/14/14 2147  WBC 19.4*  HGB 13.7  PLT 412  CREATININE 1.61*   Estimated Creatinine Clearance: 54.2 mL/min (by C-G formula based on Cr of 1.61). No results for input(s): VANCOTROUGH, VANCOPEAK, VANCORANDOM, GENTTROUGH, GENTPEAK, GENTRANDOM, TOBRATROUGH, TOBRAPEAK, TOBRARND, AMIKACINPEAK, AMIKACINTROU, AMIKACIN in the last 72 hours.   Microbiology: No results found for this or any previous visit (from the past 720 hour(s)).  Medical History: Past Medical History  Diagnosis Date  . Allergy   . GERD (gastroesophageal reflux disease)   . MS (multiple sclerosis)     Medications:   Assessment: UA: LE(tr) NO2(-) WBC TNTC Blood and urine cx pending  Goal of Therapy:  Vancomycin trough level 15-20 mcg/ml  Plan:  TBW 87.5kg  IBW 73kg  DW 87.5kg  Vd 61L kei 0.052 hr-1  T1/2 13 hours 1250 mg x1. Stacked dose of 1250 mg ordered for 17:00 and then q 18 hours. Level ordered for 22:30, 5/24 before 5th dose. Goal 15-20 for r/o bacteremia.  Kenny Rea S 06/15/2014,6:13 AM

## 2014-06-15 NOTE — Consult Note (Signed)
WOC wound consult note Reason for Consult: Stage III Pressure ulcer sacrum. Pt with MS, severely debilitated, lives at home with care from older gentleman.  Not sure if that is adequate at this time.  He reports pressure ulcer for some time but not sure of the length of time. History of bilateral heel ulcers that occurred at the time of one of his admissions to [facility].  No current pressure ulcers on his heels, Prevalon boots in place for offloading.  Pt does not move his LEs independently.  He has no use of his right arm.   Wound type: Sacrum: Stage III Pressure ulcer  Pressure Ulcer POA: Yes Measurement: 2.0cm x 1.0cm x 3.5cm tunnels at 12 o'clock  Wound bed: appears pink, non granular with epibole of the wound edges  Drainage (amount, consistency, odor) serosanguinous on the dressing, no odor, no s/s of infection  Periwound: intact Dressing procedure/placement/frequency: Normal saline packing, cover with foam dressing.  Chronic non healing wound, may be worth follow up in the wound care center at the time of DC for long term management of this wound.  Has air mattress at home and only up in [facility] limited time.  Discussed POC with patient and bedside nurse.  Re consult if needed, will not follow at this time. Thanks  Linnea Todisco [employer], CWOCN [phone])

## 2014-06-16 LAB — GLUCOSE, CAPILLARY: Glucose-Capillary: 159 mg/dL — ABNORMAL HIGH (ref 65–99)

## 2014-06-16 LAB — BASIC METABOLIC PANEL
Anion gap: 4 — ABNORMAL LOW (ref 5–15)
BUN: 22 mg/dL — AB (ref 6–20)
CALCIUM: 8.1 mg/dL — AB (ref 8.9–10.3)
CO2: 25 mmol/L (ref 22–32)
CREATININE: 1.3 mg/dL — AB (ref 0.61–1.24)
Chloride: 112 mmol/L — ABNORMAL HIGH (ref 101–111)
GFR calc Af Amer: >60 mL/min (ref >60)
GFR calc non Af Amer: >60 mL/min (ref >60)
Glucose, Bld: 92 mg/dL (ref 65–99)
Potassium: 3.7 mmol/L (ref 3.5–5.1)
Sodium: 141 mmol/L (ref 135–145)

## 2014-06-16 LAB — CBC
HCT: 32.1 % — ABNORMAL LOW (ref 40.0–52.0)
HEMOGLOBIN: 10.3 g/dL — AB (ref 13.0–18.0)
MCH: 27.7 pg (ref 26.0–34.0)
MCHC: 32 g/dL (ref 32.0–36.0)
MCV: 86.4 fL (ref 80.0–100.0)
PLATELETS: 152 10*3/uL (ref 150–440)
RBC: 3.72 MIL/uL — ABNORMAL LOW (ref 4.40–5.90)
RDW: 14.7 % — ABNORMAL HIGH (ref 11.5–14.5)
WBC: 19.4 10*3/uL — ABNORMAL HIGH (ref 3.8–10.6)

## 2014-06-16 MED ORDER — HYDRALAZINE HCL 20 MG/ML IJ SOLN
10.0000 mg | INTRAMUSCULAR | Status: DC | PRN
  Administered 2014-06-17: 05:00:00 via INTRAVENOUS
  Filled 2014-06-16: qty 1

## 2014-06-16 MED ORDER — OXYBUTYNIN CHLORIDE 5 MG PO TABS: 5.0000 mg | ORAL_TABLET | Freq: Three times a day (TID) | ORAL | Status: DC | PRN

## 2014-06-16 MED ORDER — HYDRALAZINE HCL 20 MG/ML IJ SOLN
10.0000 mg | Freq: Four times a day (QID) | INTRAMUSCULAR | Status: DC | PRN
  Administered 2014-06-16: 21:00:00 via INTRAVENOUS
  Filled 2014-06-16: qty 1

## 2014-06-16 MED ORDER — AMLODIPINE BESYLATE 5 MG PO TABS
5.0000 mg | ORAL_TABLET | Freq: Every day | ORAL | Status: DC
  Administered 2014-06-16 – 2014-06-18 (×3): 5 mg via ORAL
  Filled 2014-06-16 (×3): qty 1

## 2014-06-16 MED ORDER — METOPROLOL TARTRATE 25 MG PO TABS
25.0000 mg | ORAL_TABLET | Freq: Two times a day (BID) | ORAL | Status: DC
  Administered 2014-06-16: 12.5 mg via ORAL
  Filled 2014-06-16: qty 1

## 2014-06-16 MED ORDER — HYDRALAZINE HCL 20 MG/ML IJ SOLN
10.0000 mg | Freq: Once | INTRAMUSCULAR | Status: AC
  Administered 2014-06-16: 22:00:00 10 mg via INTRAVENOUS
  Filled 2014-06-16: qty 1

## 2014-06-16 MED ORDER — METOPROLOL TARTRATE 25 MG PO TABS
25.0000 mg | ORAL_TABLET | Freq: Once | ORAL | Status: AC
  Administered 2014-06-16: 25 mg via ORAL
  Filled 2014-06-16: qty 1

## 2014-06-16 MED ORDER — METOPROLOL TARTRATE 50 MG PO TABS
50.0000 mg | ORAL_TABLET | Freq: Two times a day (BID) | ORAL | Status: DC
  Administered 2014-06-16 – 2014-06-17 (×3): 50 mg via ORAL
  Filled 2014-06-16 (×4): qty 1

## 2014-06-16 MED ORDER — HYOSCYAMINE SULFATE 0.125 MG SL SUBL
0.1250 mg | SUBLINGUAL_TABLET | Freq: Four times a day (QID) | SUBLINGUAL | Status: DC | PRN
  Filled 2014-06-16 (×2): qty 1

## 2014-06-16 NOTE — Evaluation (Signed)
Physical Therapy Evaluation Patient Details Name: Travis Cantrell MRN: [id number] DOB: 1960/12/03 Today's Date: 06/16/2014   History of Present Illness  presented to ER secondary to bleeding from penis after catheterization by caregiver in home environment; admitted with sepsis secondary to UTI, required intervention by urology to achieve initial hemostasis.  Experienced episode of diaphoresis/hypotension post-procedure, but quickly recovered and has been stable since (? autonomic resopnse to stressor?)  Clinical Impression  Upon evaluation, patient alert and oriented to basic information; demonstrates fair insight/awareness of health conditions.  Generally disinterested in PT evaluation, focused primarily on wanting to take a nap.  Rather vague and not very forth-coming with information regarding prior level of function, daily routine and caregiver assist.  However, appears patient has been largely bed-bound/dependent for all mobility and self-care for >1 year time frame.   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  Dep for all positioning, pressure-relief techniques and therex at this time.  Patient not interested in attempts at transition to sitting, but anticipate dep for this as well.  Very high risk for further contracture development, skin breakdown Would benefit from skilled PT to address above deficits, provide education regarding positioning, HEP and OOB activities (including WC fit) and assess home safety/set up; Recommend transition home with HHPT and continued 24 hour assist upon discharge from acute hospitalization.  May be appropriate for transition to LTC if caregiver unable to provide appropriate level of assist.    Follow Up Recommendations Home health PT;Supervision/Assistance - 24 hour    Equipment Recommendations        Recommendations for Other Services       Precautions / Restrictions Precautions Precautions: Fall Restrictions Weight Bearing Restrictions: No Other Position/Activity Restrictions: no WB restructions posted in oders but pt does not have any active movement in BLEs and was bed bound and occasionally got into wc      Mobility  Bed Mobility Overal bed mobility: Needs Assistance Bed Mobility: Rolling Rolling: Max assist;Total assist         General bed mobility comments: max assist towards R, dep towards L; dep for scooting up in bed and for positioning of bilat LEs  Transfers                 General transfer comment: unsafe/unable  Ambulation/Gait             General Gait Details: unsafe/unable  Stairs            Wheelchair Mobility    Modified Rankin (Stroke Patients Only)       Balance Overall balance assessment:  (patient generally disinterested in any attempts to transition to sitting at edge of bed; anticipate dep level of assist)                                           Pertinent Vitals/Pain Pain Assessment: No/denies pain    Home Living Family/patient expects to be discharged to:: Private residence Living Arrangements: Alone (has 24/7 privite-pay caregiver)   Type of Home: House Home Access: Ramped entrance     Home Layout: One level Home Equipment: Hospital bed;Wheelchair - power (hoyer lift and sling)      Prior Function Level of Independence: Needs assistance   Gait / Transfers Assistance  Needed: Non-ambulatory; dependent for all transfers, but denies OOB attempts for at least a year.  ADL's / Homemaking Assistance Needed: Dependent for all self-care and ADLs        Hand Dominance   Dominant Hand: Left    Extremity/Trunk Assessment   Upper Extremity Assessment: Defer to OT evaluation RUE Deficits / Details: R UE generally contracted with minimal active movement; non-functional for use in self-care,  repositioning or mobility     LUE Deficits / Details: L UE generally 3-/5 throughout, decreased speed of activation at all joints   Lower Extremity Assessment:  (0/5 active movement bilat LEs; bilat ankle DF lacking approx 10 degrees bilat.  Multi-beat sustained clonus bilat LEs, + babinski L LE.  Minimal sensory awareness of bilat LEs (difficult to fully assess due to limited cooperation/investment with eval))      Cervical / Trunk Assessment:  (good cervical rotation, able to fully pick head up from pillow.  Patient not interested in attemtps at sitting edge of bed, reports has not tried in >1 year)  Communication   Communication: No difficulties  Cognition Arousal/Alertness: Awake/alert Behavior During Therapy: WFL for tasks assessed/performed (patient very vague in regards to information provided, not very forth-coming with information; not entirely receptive to education from therapist) Overall Cognitive Status: Within Functional Limits for tasks assessed       Memory: Decreased short-term memory              General Comments General comments (skin integrity, edema, etc.): Per chart, stage III decub over sacrum    Exercises Other Exercises Other Exercises: Passive ROM to bilat LEs for tone management and flexibility: ankle PF/DF (with end-range DF stretching), heel slides, hip abduct/adduct.  Noted tone 2/4 bilat hip abduct, 1+ to 2/4 L > R hip extensors, 2/4 bilat ankle PF.  Maintains R LE in position of IR at rest. (8 minutes)      Assessment/Plan    PT Assessment Patient needs continued PT services  PT Diagnosis Generalized weakness   PT Problem List Decreased strength;Decreased range of motion;Decreased activity tolerance;Decreased mobility;Decreased knowledge of use of DME;Decreased knowledge of precautions;Decreased skin integrity  PT Treatment Interventions DME instruction;Gait training;Functional mobility training;Therapeutic activities;Therapeutic  exercise;Patient/family education   PT Goals (Current goals can be found in the Care Plan section) Acute Rehab PT Goals Patient Stated Goal: "to take a nap" PT Goal Formulation: With patient Time For Goal Achievement: 06/30/14 Potential to Achieve Goals: Fair    Frequency Min 2X/week   Barriers to discharge        Co-evaluation               End of Session     Patient left: in bed;with call bell/phone within reach;with bed alarm set           Time: 1433-1455 PT Time Calculation (min) (ACUTE ONLY): 22 min   Charges:   PT Evaluation $Initial PT Evaluation Tier I: 1 Procedure PT Treatments $Therapeutic Exercise: 8-22 mins   PT G Codes:       Ashyah Quizon H. Manson Passey, PT, DPT 06/16/2014, 3:19 PM [phone]

## 2014-06-16 NOTE — Progress Notes (Signed)
transferthe pt to floor without any distress.

## 2014-06-16 NOTE — Progress Notes (Signed)
[facility] - [facility] at [facility]   PATIENT NAME: Altan Kraai    MR#:  [id number]  DATE OF BIRTH:  [birth year]  SUBJECTIVE:  CHIEF COMPLAINT:  Presented with bleeding from penis Denies any other episodes of bleeding from urethra this a.m. Resting comfortably. Denies any pain. Denies any dizziness. Reporting headache as reported by RN  REVIEW OF SYSTEMS:  CONSTITUTIONAL: No fever, fatigue . reporting weakness.  EYES: No blurred or double vision.  EARS, NOSE, AND THROAT: No tinnitus or ear pain.  RESPIRATORY: No cough, shortness of breath, wheezing or hemoptysis.  CARDIOVASCULAR: No chest pain, orthopnea, edema.  GASTROINTESTINAL: No nausea, vomiting, diarrhea or abdominal pain.  GENITOURINARY: No dysuria, hematuria.  ENDOCRINE: No polyuria, nocturia,  HEMATOLOGY: No anemia, easy bruising or bleeding SKIN: No rash or lesion. MUSCULOSKELETAL: No joint pain or arthritis.   NEUROLOGIC: No tingling, numbness, weakness.  PSYCHIATRY: No anxiety or depression.   DRUG ALLERGIES:   Allergies  Allergen Reactions  . Baclofen Diarrhea    VITALS:  Blood pressure 172/97, pulse 80, temperature 97.8 F (36.6 C), temperature source Oral, resp. rate 19, height  (1.778 m), weight 90 kg (198 lb 6.6 oz), SpO2 100 %.  PHYSICAL EXAMINATION:  GENERAL:  54 y.o.-year-old patient lying in the bed with no acute distress.  EYES: Pupils equal, round, reactive to light and accommodation. No scleral icterus. Extraocular muscles intact.  HEENT: Head atraumatic, normocephalic. Oropharynx and nasopharynx clear.  NECK:  Supple, no jugular venous distention. No thyroid enlargement, no tenderness.  LUNGS: Normal breath sounds bilaterally, no wheezing, rales,rhonchi or crepitation. No use of accessory muscles of respiration.  CARDIOVASCULAR: S1, S2 normal. No murmurs, rubs, or gallops.  ABDOMEN: Soft, nontender, nondistended. Bowel sounds present. No organomegaly or mass.   EXTREMITIES: No pedal edema, cyanosis, or clubbing.  NEUROLOGIC: Cranial nerves II through XII are intact.  Sensation intact. Gait not checked.  PSYCHIATRIC: The patient is alert and oriented x 3.  SKIN: No obvious rash, lesion, or ulcer.    LABORATORY PANEL:   CBC  Recent Labs Lab 06/16/14 0622  WBC 19.4*  HGB 10.3*  HCT 32.1*  PLT 152   ------------------------------------------------------------------------------------------------------------------  Chemistries   Recent Labs Lab 06/16/14 0622  NA 141  K 3.7  CL 112*  CO2 25  GLUCOSE 92  BUN 22*  CREATININE 1.30*  CALCIUM 8.1*   ------------------------------------------------------------------------------------------------------------------  Cardiac Enzymes No results for input(s): TROPONINI in the last 168 hours. ------------------------------------------------------------------------------------------------------------------  RADIOLOGY:  No results found.  EKG:  No orders found for this or any previous visit.  ASSESSMENT AND PLAN:   This is a 54 year old male with multiple sclerosis admitted for sepsis secondary to urinary tract infection. 1. Sepsis: Meets criteria via leukocytosis, fever and tachycardia. The patient is currently hemodynamically unstable with hypotension,  blood pressure at 81/53 and 72/59. We will provide stat IV fluid bolus with 1 L normal saline and continue IV fluids at 125 cc/h. Will provide pressors when necessary if there is no improvement in blood pressure to maintain  map at or greater than 65 Broad-spectrum antibiotics started in the emergency department; will continue the same while pending cultures, follow cultures for susceptibilities.  2. Urinary tract infection: Present on admission. The patient has received Rocephin as part of his antibiotic coverage in addition to vancomycin. If he does not appear to clear his urinary tract infection in a timely fashion we should cover  pseudomonal organisms as the patient has had an indwelling Foley  catheter. Discussed with microbiology regarding urine culture report, they will review and call me back with results, will change antibiotics depending on the culture report and sensitivity -Still has some leakage around the Foley catheter, requested follow-up by urology yesterday and also call placed today for follow-up  3. Multiple sclerosis: Stable  4. Hypertension: Elevated blood pressure. Metoprolol is resumed and up titrated to 50 malleolus by mouth twice a day. Amlodipine is added to the regimen. Titrate as needed basis.  5. DVT prophylaxis: Heparin subcutaneously. Will discontinue if patient restarts bleeding  6. GI prophylaxis: H2 blocker as needed for gastroesophageal reflux disease     All the records are reviewed and case discussed with Care Management/Social Workerr. Management plans discussed with the patient, family and they are in agreement.  CODE STATUS: Full  TOTAL  TIME TAKING CARE OF THIS PATIENT: 35 minutes.   POSSIBLE D/C IN 2-3 DAYS, DEPENDING ON CLINICAL CONDITION.   Ramonita Lab M.D on 06/16/2014 at 3:59 PM  Between 7am to 6pm - Pager - [phone] After 6pm go to [email or web address] - password [email or web address] [facility]  [facility] [place] [facility]  Office  [phone]  CC: Primary care physician; Kerby Nora, MD
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
## 2014-06-16 NOTE — Consult Note (Signed)
Urology Consult  Referring physician: Gouru Reason for referral: Hematuria  Chief Complaint: Hematuria   History of Present Illness: Middle age man with MS and Chronic foley; caregiver had difficulty changing yesterdday; assoc blood in urine Admitted with low BP and treated as Sepsis Foley in place now and light pink urine and leaking a lot around catheter No GU surgery; no stones; infrequent UTI Had significant bleeding likely from balloon trauma; had one unit of blood; Dr Diona Fanti easily placed an 18 Fr foley May 21st Cr Normal; blood c/s negative; urine c/s pending Modifying factors: There are no other modifying factors  Associated signs and symptoms: There are no other associated signs and symptoms Aggravating and relieving factors: There are no other aggravating or relieving factors Severity: Moderate Duration: Persistent  Past Medical History  Diagnosis Date  . Allergy   . GERD (gastroesophageal reflux disease)   . MS (multiple sclerosis)    Past Surgical History  Procedure Laterality Date  . Vein reconsruction    . Rotator cuff repair      Medications: I have reviewed the patient's current medications. Allergies:  Allergies  Allergen Reactions  . Baclofen Diarrhea    Family History  Problem Relation Age of Onset  . Cancer Mother     multiple myeloma  . Diabetes Father   . Heart attack Father    Social History:  reports that he has never smoked. He has never used smokeless tobacco. He reports that he drinks alcohol. He reports that he does not use illicit drugs.  ROS: All systems are reviewed and negative except as noted. Rest negative  Physical Exam:  Vital signs in last 24 hours: Temp:  [97.8 F (36.6 C)-99.3 F (37.4 C)] 97.8 F (36.6 C) (05/23 1400) Pulse Rate:  [72-100] 72 (05/23 1620) Resp:  [14-27] 19 (05/23 1400) BP: (82-189)/(54-128) 189/121 mmHg (05/23 1620) SpO2:  [94 %-100 %] 100 % (05/23 1501) Weight:  [90 kg (198 lb 6.6 oz)] 90 kg (198  lb 6.6 oz) (05/23 0500)  Cardiovascular: Skin warm; not flushed Respiratory: Breaths quiet; no shortness of breath Abdomen: No masses Neurological: Normal sensation to touch Musculoskeletal: Normal motor function arms and legs Lymphatics: No inguinal adenopathy Skin: No rashes Genitourinary:Flat soft abdomen Foley in good position and bag pinkish urine and good output  Laboratory Data:  Results for orders placed or performed during the hospital encounter of 06/14/14 (from the past 72 hour(s))  Urinalysis complete, with microscopic     Status: Abnormal   Collection Time: 06/14/14  9:47 PM  Result Value Ref Range   Color, Urine RED (A) YELLOW   APPearance HAZY (A) CLEAR   Glucose, UA NEGATIVE NEGATIVE mg/dL   Bilirubin Urine NEGATIVE NEGATIVE   Ketones, ur NEGATIVE NEGATIVE mg/dL   Specific Gravity, Urine 1.009 1.005 - 1.030   Hgb urine dipstick 2+ (A) NEGATIVE   pH 6.0 5.0 - 8.0   Protein, ur 100 (A) NEGATIVE mg/dL   Nitrite NEGATIVE NEGATIVE   Leukocytes, UA TRACE (A) NEGATIVE   RBC / HPF TOO NUMEROUS TO COUNT 0 - 5 RBC/hpf   WBC, UA TOO NUMEROUS TO COUNT 0 - 5 WBC/hpf   Bacteria, UA MANY (A) NONE SEEN   Squamous Epithelial / LPF NONE SEEN NONE SEEN  CBC with Differential     Status: Abnormal   Collection Time: 06/14/14  9:47 PM  Result Value Ref Range   WBC 19.4 (H) 3.8 - 10.6 K/uL   RBC 4.97 4.40 - 5.90  MIL/uL   Hemoglobin 13.7 13.0 - 18.0 g/dL   HCT 43.9 40.0 - 52.0 %   MCV 88.4 80.0 - 100.0 fL   MCH 27.6 26.0 - 34.0 pg   MCHC 31.2 (L) 32.0 - 36.0 g/dL   RDW 14.4 11.5 - 14.5 %   Platelets 412 150 - 440 K/uL   Neutrophils Relative % 92 %   Neutro Abs 17.9 (H) 1.4 - 6.5 K/uL   Lymphocytes Relative 6 %   Lymphs Abs 1.1 1.0 - 3.6 K/uL   Monocytes Relative 2 %   Monocytes Absolute 0.3 0.2 - 1.0 K/uL   Eosinophils Relative 0 %   Eosinophils Absolute 0.1 0 - 0.7 K/uL   Basophils Relative 0 %   Basophils Absolute 0.1 0 - 0.1 K/uL  Basic metabolic panel     Status:  Abnormal   Collection Time: 06/14/14  9:47 PM  Result Value Ref Range   Sodium 140 135 - 145 mmol/L   Potassium 4.0 3.5 - 5.1 mmol/L   Chloride 105 101 - 111 mmol/L   CO2 24 22 - 32 mmol/L   Glucose, Bld 220 (H) 65 - 99 mg/dL   BUN 25 (H) 6 - 20 mg/dL   Creatinine, Ser 1.61 (H) 0.61 - 1.24 mg/dL   Calcium 8.6 (L) 8.9 - 10.3 mg/dL   GFR calc non Af Amer 47 (L) >60 mL/min   GFR calc Af Amer 54 (L) >60 mL/min    Comment: (NOTE) The eGFR has been calculated using the CKD EPI equation. This calculation has not been validated in all clinical situations. eGFR's persistently <60 mL/min signify possible Chronic Kidney Disease.    Anion gap 11 5 - 15  Type and screen for Red Blood Exchange     Status: None (Preliminary result)   Collection Time: 06/14/14  9:47 PM  Result Value Ref Range   ABO/RH(D) O NEG    Antibody Screen NEG    Sample Expiration 06/17/2014    Unit Number [id number]    Blood Component Type RBC, LR IRR    Unit division 00    Status of Unit REL FROM [facility]    Transfusion Status OK TO TRANSFUSE    Crossmatch Result Compatible    Unit Number [id number]    Blood Component Type RBC LR PHER1    Unit division 00    Status of Unit ISSUED    Transfusion Status OK TO TRANSFUSE    Crossmatch Result COMPATIBLE    Unit Number [id number]    Blood Component Type RBC, LR IRR    Unit division 00    Status of Unit REL FROM [facility]    Transfusion Status OK TO TRANSFUSE    Crossmatch Result Compatible   ABO/Rh     Status: None   Collection Time: 06/14/14  9:47 PM  Result Value Ref Range   ABO/RH(D) O NEG   Blood culture (routine x 2)     Status: None (Preliminary result)   Collection Time: 06/14/14 10:57 PM  Result Value Ref Range   Specimen Description BLOOD    Special Requests NONE    Culture NO GROWTH < 12 HOURS    Report Status PENDING   Blood culture (routine x 2)     Status: None (Preliminary result)   Collection Time: 06/14/14 10:59 PM  Result Value Ref Range    Specimen Description BLOOD    Special Requests NONE    Culture NO GROWTH < 12 HOURS  Report Status PENDING   Glucose, capillary     Status: Abnormal   Collection Time: 06/15/14  1:49 AM  Result Value Ref Range   Glucose-Capillary 159 (H) 65 - 99 mg/dL  CBC     Status: Abnormal   Collection Time: 06/15/14  7:48 AM  Result Value Ref Range   WBC 53.2 (HH) 3.8 - 10.6 K/uL    Comment: CRITICAL RESULT CALLED TO, READ BACK BY AND VERIFIED WITH: DAN NEWTON AT 4076 ON 06/15/14 BY QSD    RBC 4.48 4.40 - 5.90 MIL/uL   Hemoglobin 12.3 (L) 13.0 - 18.0 g/dL   HCT 38.4 (L) 40.0 - 52.0 %   MCV 85.6 80.0 - 100.0 fL   MCH 27.5 26.0 - 34.0 pg   MCHC 32.1 32.0 - 36.0 g/dL   RDW 14.8 (H) 11.5 - 14.5 %   Platelets 257 150 - 440 K/uL  Creatinine, serum     Status: Abnormal   Collection Time: 06/15/14  7:48 AM  Result Value Ref Range   Creatinine, Ser 1.87 (H) 0.61 - 1.24 mg/dL   GFR calc non Af Amer 39 (L) >60 mL/min   GFR calc Af Amer 45 (L) >60 mL/min    Comment: (NOTE) The eGFR has been calculated using the CKD EPI equation. This calculation has not been validated in all clinical situations. eGFR's persistently <60 mL/min signify possible Chronic Kidney Disease.   CBC     Status: Abnormal   Collection Time: 06/16/14  6:22 AM  Result Value Ref Range   WBC 19.4 (H) 3.8 - 10.6 K/uL   RBC 3.72 (L) 4.40 - 5.90 MIL/uL   Hemoglobin 10.3 (L) 13.0 - 18.0 g/dL   HCT 32.1 (L) 40.0 - 52.0 %   MCV 86.4 80.0 - 100.0 fL   MCH 27.7 26.0 - 34.0 pg   MCHC 32.0 32.0 - 36.0 g/dL   RDW 14.7 (H) 11.5 - 14.5 %   Platelets 152 150 - 440 K/uL  Basic metabolic panel     Status: Abnormal   Collection Time: 06/16/14  6:22 AM  Result Value Ref Range   Sodium 141 135 - 145 mmol/L   Potassium 3.7 3.5 - 5.1 mmol/L   Chloride 112 (H) 101 - 111 mmol/L   CO2 25 22 - 32 mmol/L   Glucose, Bld 92 65 - 99 mg/dL   BUN 22 (H) 6 - 20 mg/dL   Creatinine, Ser 1.30 (H) 0.61 - 1.24 mg/dL   Calcium 8.1 (L) 8.9 - 10.3  mg/dL   GFR calc non Af Amer >60 >60 mL/min   GFR calc Af Amer >60 >60 mL/min    Comment: (NOTE) The eGFR has been calculated using the CKD EPI equation. This calculation has not been validated in all clinical situations. eGFR's persistently <60 mL/min signify possible Chronic Kidney Disease.    Anion gap 4 (L) 5 - 15   Recent Results (from the past 240 hour(s))  Blood culture (routine x 2)     Status: None (Preliminary result)   Collection Time: 06/14/14 10:57 PM  Result Value Ref Range Status   Specimen Description BLOOD  Final   Special Requests NONE  Final   Culture NO GROWTH < 12 HOURS  Final   Report Status PENDING  Incomplete  Blood culture (routine x 2)     Status: None (Preliminary result)   Collection Time: 06/14/14 10:59 PM  Result Value Ref Range Status   Specimen Description BLOOD  Final  Special Requests NONE  Final   Culture NO GROWTH < 12 HOURS  Final   Report Status PENDING  Incomplete   Creatinine:  Recent Labs  06/14/14 2147 06/15/14 0748 06/16/14 0622  CREATININE 1.61* 1.87* 1.30*    Xrays: See report/chart None Bladder scan today: 6 cc  Impression/Assessment:  Catheter Trauma resolved Bleeding from trauma/catheter/?UTI Incontinence around catheter due to bladder spasms (and/or UTI)  Plan:  Leave foley in and change with his usual "home protocol" in about 4 weeks Treat UTI if c/s positive  F/up with Urology locally Dr Erlene Quan Oxybytynin 5 mg q8hours PRN for bladder spasms   MACDIARMID,SCOTT A 06/16/2014, 5:36 PM

## 2014-06-16 NOTE — Care Management (Signed)
Patient presents from home.  Nursing is concerned that patient can not return home.   It is reported that he has chronic foley and when his caregiver tried to change it, patient began to bleed.  Spoke with patient and his sister Bonnita Nasuti.  Patient with progressive MS.  Has a paid live in caregiver. Johns says that he is afraid to ask [name] to do a lot of things because "he may quit."   Patient has DME- hoyer lift, pressure reduction mattress, scooter.  Asked patient what is he afraid to ask caregiver to do daily and he says- "get me out of bed everyday."  Patient does not get out of bed to chair daily because it is "so much trouble for [name]."  It is reported that this caregiver- [name] "changes the foley every month"  Patient has sacral decubitus.   There has been discussion regarding making a referral to [facility] but Bonnita Nasuti says "we  are not sure if they take Regan's insurance."  No referral has been initiated.  Annie says he is seen by a hospice agency twice weekly.  After many calls- found that agency [facility].  He is not followed by hospice.  Agency has had nursing involved since Nov 2015 for wound care.  No other services.  Patient will benefit from home health SN- resume and management of chronic foley,  OT- to assess for adaptive equipment for adls such as feeding; Physical therapy-  transfer training home modifications, is pressure relieving mattress meeting needs, aide for personal care and social work to assess care delivery- are care needs being met-  Initiate referral to [facility].  CSW referral due to concerns that patient care needs may be neglected.  Patient wishes to remain in the home

## 2014-06-16 NOTE — Progress Notes (Signed)
Pt resting comfortably in bed, seen by Occupation therapy and given assistive devices, but is still not eating much. Foley continues to leak around urethral opening, foley continues to drain bloody urine. Pt co head ache ealeier, and medicated for same but BP remains high. Lopressor dose increased this am , but BP now 197/117, pt states BP is always higher in RT than Left , but repeat BP in Left is still elevated. MD - Dr Amado Coe informed

## 2014-06-17 LAB — PHOSPHORUS: Phosphorus: 2.8 mg/dL (ref 2.5–4.6)

## 2014-06-17 LAB — CBC
HCT: 36.8 % — ABNORMAL LOW (ref 40.0–52.0)
Hemoglobin: 12.1 g/dL — ABNORMAL LOW (ref 13.0–18.0)
MCH: 28 pg (ref 26.0–34.0)
MCHC: 32.9 g/dL (ref 32.0–36.0)
MCV: 85 fL (ref 80.0–100.0)
Platelets: 213 10*3/uL (ref 150–440)
RBC: 4.32 MIL/uL — ABNORMAL LOW (ref 4.40–5.90)
RDW: 14 % (ref 11.5–14.5)
WBC: 20 10*3/uL — ABNORMAL HIGH (ref 3.8–10.6)

## 2014-06-17 LAB — BASIC METABOLIC PANEL
Anion gap: 9 (ref 5–15)
BUN: 17 mg/dL (ref 6–20)
CHLORIDE: 104 mmol/L (ref 101–111)
CO2: 26 mmol/L (ref 22–32)
CREATININE: 1.01 mg/dL (ref 0.61–1.24)
Calcium: 8.9 mg/dL (ref 8.9–10.3)
GFR calc non Af Amer: >60 mL/min (ref >60)
GLUCOSE: 115 mg/dL — AB (ref 65–99)
POTASSIUM: 3.4 mmol/L — AB (ref 3.5–5.1)
Sodium: 139 mmol/L (ref 135–145)

## 2014-06-17 LAB — MAGNESIUM: MAGNESIUM: 1.6 mg/dL — AB (ref 1.7–2.4)

## 2014-06-17 LAB — VANCOMYCIN, TROUGH: Vancomycin Tr: 15 ug/mL (ref 10–20)

## 2014-06-17 MED ORDER — VANCOMYCIN HCL IN DEXTROSE 1-5 GM/200ML-% IV SOLN
1000.0000 mg | Freq: Two times a day (BID) | INTRAVENOUS | Status: DC
  Administered 2014-06-17 – 2014-06-18 (×2): 1000 mg via INTRAVENOUS
  Filled 2014-06-17 (×4): qty 200

## 2014-06-17 MED ORDER — VANCOMYCIN HCL IN DEXTROSE 1-5 GM/200ML-% IV SOLN
1000.0000 mg | Freq: Two times a day (BID) | INTRAVENOUS | Status: DC
  Filled 2014-06-17 (×3): qty 200

## 2014-06-17 MED ORDER — SODIUM CHLORIDE 0.9 % IJ SOLN
3.0000 mL | Freq: Three times a day (TID) | INTRAMUSCULAR | Status: DC
  Administered 2014-06-17 – 2014-06-18 (×3): 3 mL via INTRAVENOUS

## 2014-06-17 MED ORDER — POTASSIUM CHLORIDE 20 MEQ PO PACK
40.0000 meq | PACK | Freq: Once | ORAL | Status: AC
  Administered 2014-06-17: 12:00:00 40 meq via ORAL
  Filled 2014-06-17: qty 2

## 2014-06-17 NOTE — Clinical Social Work Note (Signed)
Clinical Social Work Assessment  Patient Details  Name: Travis Cantrell MRN: [id number] Date of Birth: 1960/07/30  Date of referral:  06/17/14               Reason for consult:  Facility Placement, Abuse/Neglect                Permission sought to share information with:  Case Manager Permission granted to share information::  No   Housing/Transportation Living arrangements for the past 2 months:  Single Family Home Source of Information:  Patient Patient Interpreter Needed:  None Criminal Activity/Legal Involvement Pertinent to Current Situation/Hospitalization:  No - Comment as needed Significant Relationships:  Friend Lives with:  Friends Do you feel safe going back to the place where you live?  Yes Need for family participation in patient care:  No (Coment)  Care giving concerns:  Per referral there are caregiving concerns at home, however pt denies any issues. Pt has home health agency who comes out 2-3 a week. CSW recommends a Education officer, museum be added to home health team.   Social Worker assessment / plan:  CSW met with pt to address consult. CSW introduced herself and explained role of social work. CSW also explained the process of discharging to an ALF and a SNF. Pt lives with a friend, whom he pays, for support in his caregiving needs. Pt reports that pt's friend is able to provide needed support at home. In the event that friend is no longer able to do so, pt will hire someone else. Pt declined ALF, as she does not want to give up his money. Pt also declined SNF. Pt reports that he feels safe returning home and will need EMS transportation. RNCM is following as well for home health needs. CSW requested a social work to be added to the treatment team for added support in the home. CSW is signing off as no further needs identified. Please reconsult if a need arises prior to discharge.   Employment status:  Disabled (Comment on whether or not currently receiving Disability) Insurance  information:  Programmer, applications PT Recommendations:  Home with [place], 24 Hour Supervision Information / Referral to community resources:  Other (Comment Required) ([place])  Patient/Family's Response to care:  Pt would like to return home at discharge. He reports being anxious to return home.   Patient/Family's Understanding of and Emotional Response to Diagnosis, Current Treatment, and Prognosis:  Pt is aware that he needs assistance at home, therefore he is open to added services through home health and having his friend at home with him 24 hours.   Emotional Assessment Appearance:  Appears stated age Attitude/Demeanor/Rapport:  Other Affect (typically observed):  Accepting, Anxious Orientation:  Oriented to Self, Oriented to Place, Oriented to  Time, Oriented to Situation Alcohol / Substance use:  Never Used Psych involvement (Current and /or in the community):  No (Comment)  Discharge Needs  Concerns to be addressed:  Denies Needs/Concerns at this time Readmission within the last 30 days:  No Current discharge risk:  Chronically ill, Dependent with Mobility Barriers to Discharge:  Barriers Resolved   Darden Dates, LCSW 06/17/2014, 12:14 PM

## 2014-06-17 NOTE — Progress Notes (Signed)
[facility] [place] at [facility]   PATIENT NAME: Travis Cantrell    MR#:  [id number]  DATE OF BIRTH:  04-24-1960  SUBJECTIVE:  CHIEF COMPLAINT:  Presented with bleeding from penis Denies any other episodes of bleeding from urethra this a.m. Resting comfortably. Denies any pain.   REVIEW OF SYSTEMS:  CONSTITUTIONAL: No fever, fatigue . reporting weakness.  EYES: No blurred or double vision.  EARS, NOSE, AND THROAT: No tinnitus or ear pain.  RESPIRATORY: No cough, shortness of breath, wheezing or hemoptysis.  CARDIOVASCULAR: No chest pain, orthopnea, edema.  GASTROINTESTINAL: No nausea, vomiting, diarrhea or abdominal pain.  GENITOURINARY: No dysuria, hematuria.  ENDOCRINE: No polyuria, nocturia,  HEMATOLOGY: No anemia, easy bruising or bleeding SKIN: No rash or lesion. MUSCULOSKELETAL: No joint pain or arthritis.   NEUROLOGIC: No tingling, numbness, weakness.  PSYCHIATRY: No anxiety or depression.   DRUG ALLERGIES:   Allergies  Allergen Reactions  . Baclofen Diarrhea    VITALS:  Blood pressure 141/81, pulse 60, temperature 99.2 F (37.3 C), temperature source Oral, resp. rate 16, height 5\' 10"  (1.778 m), weight 80.74 kg (178 lb), SpO2 95 %.  PHYSICAL EXAMINATION:  GENERAL:  54 y.o.-year-old patient lying in the bed with no acute distress.  EYES: Pupils equal, round, reactive to light and accommodation. No scleral icterus. Extraocular muscles intact.  HEENT: Head atraumatic, normocephalic. Oropharynx and nasopharynx clear.  NECK:  Supple, no jugular venous distention. No thyroid enlargement, no tenderness.  LUNGS: Normal breath sounds bilaterally, no wheezing, rales,rhonchi or crepitation. No use of accessory muscles of respiration.  CARDIOVASCULAR: S1, S2 normal. No murmurs, rubs, or gallops.  ABDOMEN: Soft, nontender, nondistended. Bowel sounds present. No organomegaly or mass.  EXTREMITIES: No pedal edema, cyanosis, or clubbing.  NEUROLOGIC:  Cranial nerves II through XII are intact.  Sensation intact. Gait not checked.  PSYCHIATRIC: The patient is alert and oriented x 3.  SKIN: No obvious rash, lesion, or ulcer.    LABORATORY PANEL:   CBC  Recent Labs Lab 06/17/14 0453  WBC 20.0*  HGB 12.1*  HCT 36.8*  PLT 213   ------------------------------------------------------------------------------------------------------------------  Chemistries   Recent Labs Lab 06/17/14 0453  NA 139  K 3.4*  CL 104  CO2 26  GLUCOSE 115*  BUN 17  CREATININE 1.01  CALCIUM 8.9  MG 1.6*   ------------------------------------------------------------------------------------------------------------------  Cardiac Enzymes No results for input(s): TROPONINI in the last 168 hours. ------------------------------------------------------------------------------------------------------------------  RADIOLOGY:  No results found.  EKG:   Orders placed or performed during the hospital encounter of 06/14/14  . EKG 12-Lead  . EKG 12-Lead    ASSESSMENT AND PLAN:   This is a 54 year old male with multiple sclerosis admitted for sepsis secondary to urinary tract infection. 1. Sepsis: Meets criteria via leukocytosis, fever and tachycardia. The patient is currently hemodynamically stable  2. Urinary tract infection: Present on admission. The patient has received Rocephin as part of his antibiotic coverage in addition to vancomycin.  Discussed with microbiology regarding urine culture report, they will review and call me back with results, will change antibiotics depending on the culture report and sensitivity  -urology added oxybutrin for spasms Op f/u with urology dr.Brandon Chronic indwelling foly cath to be changed  Every 4 weeks  -3. Multiple sclerosis: Stable  4. Hypertension: Elevated blood pressure. Metoprolol is resumed and up titrated to 50 malleolus by mouth twice a day. Amlodipine is added to the regimen. Titrate as needed  basis.  5. DVT prophylaxis: Heparin subcutaneously. Will  discontinue if patient restarts bleeding  6. GI prophylaxis: H2 blocker as needed for gastroesophageal reflux disease     All the records are reviewed and case discussed with Care Management/Social Workerr. Management plans discussed with the patient, family and they are in agreement.  CODE STATUS: Full  TOTAL  TIME TAKING CARE OF THIS PATIENT: 35 minutes.   POSSIBLE D/C IN 1-2 DAYS, DEPENDING ON CLINICAL CONDITION.   Ramonita Lab M.D on 06/17/2014 at 8:13 PM  Between 7am to 6pm - Pager - [phone] After 6pm go to [email or web address] - password [email or web address] [facility]  [facility]  Office  [phone]  CC: Primary care physician; Kerby Nora, MD

## 2014-06-17 NOTE — Plan of Care (Signed)
Problem: Discharge Progression Outcomes Goal: Discharge plan in place and appropriate Individualization Pt goes by Travis Cantrell, lives at home, does have a caretaker. He has MS and a hx of gerd, he is on home medications. He has a chronic foley in place at this time. Pt's right arm/hand is contracted, he is A&O, high fall risk.  Goal: Other Discharge Outcomes/Goals Outcome: Progressing Progress to goal: Pt was a transfer from CCU tonight, he has no complaints of pain, will call out for assistance when needed. Foley in place draining clear yellow urine. Sacral wound covered with pink foam as well as skin abrasions on his arms and legs.

## 2014-06-17 NOTE — Care Management Note (Signed)
Case Management Note  Patient Details  Name: Travis Cantrell MRN: [id number] Date of Birth: 01-23-1961  Subjective/Objective:                   Patient resting in bed requesting assistance with removing cover due to being hot. States he lives with a male friend; Child psychotherapist consulted due to concern related to lack of care in the home. Patient is open to [facility] care. Patient states he is dependent on EMS for transportation.  Action/Plan:  Social worker updated regarding transportation needs. RNCM will follow for home health needs. [phone]. Expected Discharge Date:                  Expected Discharge Plan:     In-House Referral:  Clinical Social Work  Discharge planning Services  CM Consult  Post Acute Care Choice:    Choice offered to:  Patient  DME Arranged:    DME Agency:     HH Arranged:    HH Agency:  [facility]  Status of Service:     Medicare Important Message Given:    Date Medicare IM Given:    Medicare IM give by:    Date Additional Medicare IM Given:    Additional Medicare Important Message give by:     If discussed at Long Length of Stay Meetings, dates discussed:    Additional Comments:  [name], RN 06/17/2014, 11:23 AM

## 2014-06-17 NOTE — Plan of Care (Signed)
Problem: Phase I Progression Outcomes Goal: OOB as tolerated unless otherwise ordered Outcome: Progressing Pt has not been OOB today but has been repositioned every 2 hours duirng my shift.  Goal: Initial discharge plan identified Outcome: Progressing Possible discharge tomorrow and [facility] services have been contacted and PT/OT will see pt.

## 2014-06-17 NOTE — Progress Notes (Signed)
Occupational Therapy Treatment Patient Details Name: Travis Cantrell MRN: [id number] DOB: 06/19/1960 Today's Date: 06/17/2014    History of present illness presented to ER secondary to bleeding from penis after catheterization by caregiver in home environment; admitted with sepsis secondary to UTI, required intervention by urology to achieve initial hemostasis.  Experienced episode of diaphoresis/hypotension post-procedure, but quickly recovered and has been stable since (? autonomic resopnse to stressor?)   OT comments  Pt seen for review of recommended feeding equipment including dish and plate with built up sides for one handed scooping, two handled cup to use gross grasp vs pincer grasp, curved utensils and extra long straws to assist with self feeding independence.  Pt may go home tomorrow with [facility] OT, PT and SW with caregiver 24/7. Will continue to see pt until he is D/C'd home.   Follow Up Recommendations       Equipment Recommendations       Recommendations for Other Services      Precautions / Restrictions         Mobility Bed Mobility                  Transfers                      Balance                                   ADL                                                Vision                     Perception     Praxis      Cognition                             Extremity/Trunk Assessment               Exercises     Shoulder Instructions       General Comments      Pertinent Vitals/ Pain          Home Living                                          Prior Functioning/Environment              Frequency       Progress Toward Goals  OT Goals(current goals can now be found in the care plan section)  Progress towards OT goals: Progressing toward goals     Plan      Co-evaluation                 End of Session     Activity Tolerance  Patient tolerated treatment well   Patient Left in bed;with call bell/phone within reach;with bed alarm set   Nurse Communication          Time: 7829-5621 OT Time Calculation (min): 23 min  Charges: OT General Charges $OT Visit: 1 Procedure OT Treatments $Self Care/Home Management : 23-37 mins  Mansfield Dann 06/17/2014,  3:19 PM    Susanne Borders, OTR/L

## 2014-06-17 NOTE — Progress Notes (Addendum)
ANTIBIOTIC CONSULT NOTE - Follow Up  Pharmacy Consult for vancomycin dosing Indication: UTI  Allergies  Allergen Reactions  . Baclofen Diarrhea    Patient Measurements: Height: 5\' 10"  (177.8 cm) Weight: 178 lb (80.74 kg) IBW/kg (Calculated) : 73 Adjusted Body Weight: 76.1 kg  Vital Signs: Temp: 99.2 F (37.3 C) (05/24 0510) Temp Source: Oral (05/24 0510) BP: 158/94 mmHg (05/24 0920) Pulse Rate: 102 (05/24 0920) Intake/Output from previous day: 05/23 0701 - 05/24 0700 In: 750 [I.V.:750] Out: 4100 [Urine:4100] Intake/Output from this shift:    Labs:  Recent Labs  06/15/14 0748 06/16/14 0622 06/17/14 0453  WBC 53.2* 19.4* 20.0*  HGB 12.3* 10.3* 12.1*  PLT 257 152 213  CREATININE 1.87* 1.30* 1.01   Estimated Creatinine Clearance: 86.3 mL/min (by C-G formula based on Cr of 1.01). No results for input(s): VANCOTROUGH, VANCOPEAK, VANCORANDOM, GENTTROUGH, GENTPEAK, GENTRANDOM, TOBRATROUGH, TOBRAPEAK, TOBRARND, AMIKACINPEAK, AMIKACINTROU, AMIKACIN in the last 72 hours.   Microbiology: Recent Results (from the past 720 hour(s))  Blood culture (routine x 2)     Status: None (Preliminary result)   Collection Time: 06/14/14 10:57 PM  Result Value Ref Range Status   Specimen Description BLOOD  Final   Special Requests NONE  Final   Culture NO GROWTH < 12 HOURS  Final   Report Status PENDING  Incomplete  Blood culture (routine x 2)     Status: None (Preliminary result)   Collection Time: 06/14/14 10:59 PM  Result Value Ref Range Status   Specimen Description BLOOD  Final   Special Requests NONE  Final   Culture NO GROWTH < 12 HOURS  Final   Report Status PENDING  Incomplete    Medical History: Past Medical History  Diagnosis Date  . Allergy   . GERD (gastroesophageal reflux disease)   . MS (multiple sclerosis)     Medications:  Vancomycin 1 gm IV q18h  Assessment: UA: LE(tr) NO2(-) WBC TNTC Blood Cx: NG x 2 Urine Cx: pending  SCr: 1.01, est CrCl~89  mL/min, ke: 0.076, t1/2: 9.12 h, Vd: 53 L  Goal of Therapy:  Vancomycin trough level 15-20 mcg/ml  Plan:  Patient's renal function is improving.  Kinetic parameters yield trough <10 with current orders.  Will transition patient to Vancomycin 1 gm IV q12h.  Will order a trough prior to dose this evening to ensure appropriate dosing.  Clarisa Schools, PharmD Clinical Pharmacist 06/17/2014  Vancomycin trough is 15. Will continue current dosing and check another trough at steady-state.   Luisa Hart, PharmD

## 2014-06-17 NOTE — Progress Notes (Signed)
Pt refused dressing change and wants to wait until RN comes to home on Thursday. Pt educated about infection prevention and notified that dressing was last changed 5/23 and Thursday would be too long to wait. He stated that maybe it can be done later, but at that time, he didn't want it changed. Will follow up.

## 2014-06-17 NOTE — Progress Notes (Signed)
Initial Nutrition Assessment  DOCUMENTATION CODES:     INTERVENTION: Meals and Snacks: Cater to patient preferences Medical Food Supplement Therapy: will send Mighty Shakes TID and Magic Cup BID for added nutrition    NUTRITION DIAGNOSIS:  Inadequate oral intake related to acute illness as evidenced by meal completion < 25% today at lunch  GOAL:  Patient will meet greater than or equal to 90% of their needs  MONITOR:   (Energy Intake, Electrolyte and Renal Profile, Anthropometrics, Anemia Profile, Skin)  REASON FOR ASSESSMENT:   (RD Screen pressure ulcer)    ASSESSMENT:  Pt admitted with bleeding from penis s/p caregiver attempt to place catheter PTA as well as UTI. PMHx:  Past Medical History  Diagnosis Date  . Allergy   . GERD (gastroesophageal reflux disease)   . MS (multiple sclerosis)    PO Intake: pt ate 0% of lunch today (hamburger and french fries), reports not being hungry. Pt reports a good appetite PTA. Pt reports drinking supplement drink when available. Pt reports no difficulty swallowing. Per CNA, Lauris Poag pt is an assisted feeder currently.  Medications: Colace Labs: CBC Latest Ref Rng 06/17/2014 06/16/2014 06/15/2014  WBC 3.8 - 10.6 K/uL 20.0(H) 19.4(H) 53.2(HH)  Hemoglobin 13.0 - 18.0 g/dL 12.1(L) 10.3(L) 12.3(L)  Hematocrit 40.0 - 52.0 % 36.8(L) 32.1(L) 38.4(L)  Platelets 150 - 440 K/uL 213 152 257    Electrolyte and Renal Profile:    Recent Labs Lab 06/14/14 2147 06/15/14 0748 06/16/14 0622 06/17/14 0453  BUN 25*  --  22* 17  CREATININE 1.61* 1.87* 1.30* 1.01  NA 140  --  141 139  K 4.0  --  3.7 3.4*  MG  --   --   --  1.6*  PHOS  --   --   --  2.8   Glucose Profile:  Recent Labs  06/15/14 0149  GLUCAP 159*   Protein Profile: No results for input(s): ALBUMIN in the last 168 hours.  Pt reports stable weight PTA.   Height:  Ht Readings from Last 1 Encounters:  06/16/14 5\' 10"  (1.778 m)    Weight:  Wt Readings from Last 1  Encounters:  06/17/14 178 lb (80.74 kg)    Ideal Body Weight:     Wt Readings from Last 10 Encounters:  06/17/14 178 lb (80.74 kg)  06/11/13 250 lb (113.399 kg)  12/08/09 215 lb (97.523 kg)  09/25/09 215 lb (97.523 kg)  04/29/08 215 lb (97.523 kg)  08/29/07 215 lb (97.523 kg)    BMI:  Body mass index is 25.54 kg/(m^2).    Unable to complete Nutrition-Focused physical exam at this time.     Estimated Nutritional Needs:  Kcal:  2170-2565kcals, BEE: 1644kcals, TEE: (IF 1.1-1.3)(AF 1.2)   Protein:  88-104g protein (1.1-1.3g/kg)  Fluid:  2008-2422mL of fluid (25-2mL/kg)  Skin:  Wound (see comment) (Stage III Coccyx)  Diet Order:  Diet regular Room service appropriate?: Yes; Fluid consistency:: Thin  EDUCATION NEEDS:  No education needs identified at this time   Intake/Output Summary (Last 24 hours) at 06/17/14 1439 Last data filed at 06/17/14 1340  Gross per 24 hour  Intake      0 ml  Output   4100 ml  Net  -4100 ml    Last BM:  5/24   MODERATE Care Level  Leda Quail, RD, LDN Pager [phone]

## 2014-06-18 ENCOUNTER — Other Ambulatory Visit: Payer: Self-pay | Admitting: Family Medicine

## 2014-06-18 LAB — CBC
HCT: 34.5 % — ABNORMAL LOW (ref 40.0–52.0)
Hemoglobin: 11.4 g/dL — ABNORMAL LOW (ref 13.0–18.0)
MCH: 28.1 pg (ref 26.0–34.0)
MCHC: 33 g/dL (ref 32.0–36.0)
MCV: 85.2 fL (ref 80.0–100.0)
Platelets: 205 10*3/uL (ref 150–440)
RBC: 4.05 MIL/uL — AB (ref 4.40–5.90)
RDW: 14 % (ref 11.5–14.5)
WBC: 12 10*3/uL — AB (ref 3.8–10.6)

## 2014-06-18 MED ORDER — SACCHAROMYCES BOULARDII 250 MG PO CAPS: 250.0000 mg | ORAL_CAPSULE | Freq: Two times a day (BID) | ORAL | Status: DC

## 2014-06-18 MED ORDER — METOPROLOL TARTRATE 50 MG PO TABS: 50.0000 mg | ORAL_TABLET | Freq: Two times a day (BID) | ORAL | Status: DC

## 2014-06-18 MED ORDER — AMLODIPINE BESYLATE 5 MG PO TABS: 5.0000 mg | ORAL_TABLET | Freq: Every day | ORAL | Status: DC

## 2014-06-18 MED ORDER — SACCHAROMYCES BOULARDII 250 MG PO CAPS
250.0000 mg | ORAL_CAPSULE | Freq: Two times a day (BID) | ORAL | Status: DC
  Filled 2014-06-18 (×2): qty 1

## 2014-06-18 MED ORDER — AMOXICILLIN-POT CLAVULANATE 875-125 MG PO TABS: 1.0000 | ORAL_TABLET | Freq: Two times a day (BID) | ORAL | Status: AC

## 2014-06-18 MED ORDER — OXYBUTYNIN CHLORIDE 5 MG PO TABS: 5.0000 mg | ORAL_TABLET | Freq: Three times a day (TID) | ORAL | Status: DC | PRN

## 2014-06-18 MED ORDER — DOCUSATE SODIUM 100 MG PO CAPS: 100.0000 mg | ORAL_CAPSULE | Freq: Two times a day (BID) | ORAL | Status: DC

## 2014-06-18 NOTE — Care Management CHF Note (Signed)
Patient discharging home today followed by [facility] home care. I have notified Toniann Fail with [facility] of patient discharge today. Orders for home health faxed to [facility]. CSW will arrange EMS to home for this patient. No further RNCM needs. Case closed.

## 2014-06-18 NOTE — Telephone Encounter (Signed)
Last office visit 11/19/2013.  Last refilled 04/24/2014 for #30 with no refills.  Ok to refill?

## 2014-06-18 NOTE — Consult Note (Signed)
Reason - UTI Referring Physician: Gouru  Principal Problem:   Sepsis Active Problems:   UTI (urinary tract infection) due to urinary indwelling Foley catheter   HPI: Travis Cantrell is a 54 y.o. male admitted 5/22 with hematuria. He has MS and a chronic foley cath. It was time to change it q 4 weeks and a caregiver helped him but it was traumatic and had significant hematuria.  Came to [place] and had an event with hypotension, elevated wbc and fever. Started on ceftraixone and vancomycin. [place] neg but ucx not done until several days in hospital Since dc fevers resolved, hd stable and wbc improved. Has fu with urology.   Past Medical History  Diagnosis Date  . Allergy   . GERD (gastroesophageal reflux disease)   . MS (multiple sclerosis)    Past Surgical History  Procedure Laterality Date  . Vein reconsruction    . Rotator cuff repair     Allergies:  Allergies  Allergen Reactions  . Baclofen Diarrhea    Antibiotics Given (last 72 hours)    Date/Time Action Medication Dose Rate   06/15/14 1738 Given   vancomycin (VANCOCIN) IVPB 1000 mg/200 mL premix 1,000 mg 200 mL/hr   06/16/14 1134 Given   vancomycin (VANCOCIN) IVPB 1000 mg/200 mL premix 1,000 mg 200 mL/hr   06/17/14 0521 Given   vancomycin (VANCOCIN) IVPB 1000 mg/200 mL premix 1,000 mg 200 mL/hr   06/17/14 1704 Given   vancomycin (VANCOCIN) IVPB 1000 mg/200 mL premix 1,000 mg 200 mL/hr   06/18/14 0524 Given   vancomycin (VANCOCIN) IVPB 1000 mg/200 mL premix 1,000 mg 200 mL/hr     MEDICATIONS: . sodium chloride  10 mL/hr Intravenous Once  . amLODipine  5 mg Oral Daily  . docusate sodium  100 mg Oral BID  . FLUoxetine  40 mg Oral Daily  . heparin  5,000 Units Subcutaneous 3 times per day  . metoprolol tartrate  50 mg Oral BID  . nystatin   Topical TID  . saccharomyces boulardii  250 mg Oral BID  . sodium chloride  3 mL Intravenous Q8H  . vancomycin  1,000 mg Intravenous Q12H    History  Substance Use Topics  .  Smoking status: Never Smoker   . Smokeless tobacco: Never Used  . Alcohol Use: Yes     Comment: occassional beer    Family History  Problem Relation Age of Onset  . Cancer Mother     multiple myeloma  . Diabetes Father   . Heart attack Father     Review of Systems - 11 systems reviewed and negative per HPI OBJECTIVE: Temp:  [98.1 F (36.7 C)-98.9 F (37.2 C)] 98.4 F (36.9 C) (05/25 1303) Pulse Rate:  [52-66] 52 (05/25 0457) Resp:  [16-18] 18 (05/25 1303) BP: (126-145)/(77-90) 145/89 mmHg (05/25 1303) SpO2:  [96 %-99 %] 99 % (05/25 1303) Weight:  [79.017 kg (174 lb 3.2 oz)] 79.017 kg (174 lb 3.2 oz) (05/25 0500) Constitutional: He is oriented to person, place, and time. Some contractures HENT:  Head: Normocephalic and atraumatic.  Mouth/Throat: Mucous membranes are moist. No oropharyngeal exudate.  Eyes: Conjunctivae and EOM are normal. Pupils are equal, round, and reactive to light. No scleral icterus.  Neck: Normal range of motion. Neck supple. No JVD present. No tracheal deviation present. No thyromegaly present.  Cardiovascular: Regular rhythm, normal heart sounds and intact distal pulses. Exam reveals no gallop and no friction rub.  No murmur heard. Respiratory: Effort normal and breath  sounds normal.  GI: Soft. Bowel sounds are normal. He exhibits no distension. There is no tenderness.  Genitourinary: Penis normal.  Foley in place  Musculoskeletal: He exhibits no edema.  Contractures present right wrist and fingers also left ankle and right lower extremity  Lymphadenopathy:   He has no cervical adenopathy.  Neurological: He is alert and oriented to person, place, and time. He displays atrophy. No cranial nerve deficit or sensory deficit.  B/l lower extremity paralysis  Skin: nml   LABS: Results for orders placed or performed during the hospital encounter of 06/14/14 (from the past 48 hour(s))  CBC     Status: Abnormal   Collection Time: 06/17/14  4:53 AM   Result Value Ref Range   WBC 20.0 (H) 3.8 - 10.6 K/uL   RBC 4.32 (L) 4.40 - 5.90 MIL/uL   Hemoglobin 12.1 (L) 13.0 - 18.0 g/dL   HCT 36.8 (L) 40.0 - 52.0 %   MCV 85.0 80.0 - 100.0 fL   MCH 28.0 26.0 - 34.0 pg   MCHC 32.9 32.0 - 36.0 g/dL   RDW 14.0 11.5 - 14.5 %   Platelets 213 150 - 440 K/uL  Basic metabolic panel     Status: Abnormal   Collection Time: 06/17/14  4:53 AM  Result Value Ref Range   Sodium 139 135 - 145 mmol/L   Potassium 3.4 (L) 3.5 - 5.1 mmol/L   Chloride 104 101 - 111 mmol/L   CO2 26 22 - 32 mmol/L   Glucose, Bld 115 (H) 65 - 99 mg/dL   BUN 17 6 - 20 mg/dL   Creatinine, Ser 1.01 0.61 - 1.24 mg/dL   Calcium 8.9 8.9 - 10.3 mg/dL   GFR calc non Af Amer >60 >60 mL/min   GFR calc Af Amer >60 >60 mL/min    Comment: (NOTE) The eGFR has been calculated using the CKD EPI equation. This calculation has not been validated in all clinical situations. eGFR's persistently <60 mL/min signify possible Chronic Kidney Disease.    Anion gap 9 5 - 15  Magnesium     Status: Abnormal   Collection Time: 06/17/14  4:53 AM  Result Value Ref Range   Magnesium 1.6 (L) 1.7 - 2.4 mg/dL  Phosphorus     Status: None   Collection Time: 06/17/14  4:53 AM  Result Value Ref Range   Phosphorus 2.8 2.5 - 4.6 mg/dL  Vancomycin, trough     Status: None   Collection Time: 06/17/14  4:47 PM  Result Value Ref Range   Vancomycin Tr 15 10 - 20 ug/mL  CBC     Status: Abnormal   Collection Time: 06/18/14  3:54 AM  Result Value Ref Range   WBC 12.0 (H) 3.8 - 10.6 K/uL   RBC 4.05 (L) 4.40 - 5.90 MIL/uL   Hemoglobin 11.4 (L) 13.0 - 18.0 g/dL   HCT 34.5 (L) 40.0 - 52.0 %   MCV 85.2 80.0 - 100.0 fL   MCH 28.1 26.0 - 34.0 pg   MCHC 33.0 32.0 - 36.0 g/dL   RDW 14.0 11.5 - 14.5 %   Platelets 205 150 - 440 K/uL   No components found for: ESR, C REACTIVE PROTEIN MICRO: Urinalysis    Component Value Date/Time   COLORURINE RED* 06/14/2014 2147   APPEARANCEUR HAZY* 06/14/2014 2147   LABSPEC  1.009 06/14/2014 2147   PHURINE 6.0 06/14/2014 2147   GLUCOSEU NEGATIVE 06/14/2014 2147   HGBUR 2+* 06/14/2014 2147   BILIRUBINUR  NEGATIVE 06/14/2014 2147   BILIRUBINUR neg 02/18/2011 [facility] 06/14/2014 2147   PROTEINUR 100* 06/14/2014 2147   PROTEINUR 100 02/18/2011 1229   UROBILINOGEN 4.0* 05/28/2011 1859   UROBILINOGEN negative 02/18/2011 1229   NITRITE NEGATIVE 06/14/2014 2147   NITRITE POSITIVE 02/18/2011 1229   LEUKOCYTESUR TRACE* 06/14/2014 2147   IMAGING: none   Assessment/Plan:  54 yo with MS and chronic foley admitted 5/21 with hematuria following traumatic foley change. Also had sepsis like physiology and elevated wbc, fever.  Clincially improved with vanco and ceftraixone.  His ucx neg but sent several days into hospitalization. UA with TNTC WBC and TNTC RBC. BCX negative.  He does not have frequent UTIs per patient.  Has been seen by Urology It is difficult to know what abx would be best for him since cx negative. Would rec augmentin for a total 10 more days. He will fu with Urology. I do not need to see as otpt unless develops drug resistant UTIs. Thank you for consult.

## 2014-06-18 NOTE — Progress Notes (Signed)
Spoke with JD, [employer] rep at [phone] to notify of non-emergent EMS transport.  Due to their system being down, unable to open a new case and generate an auth reference number, however a downtime form was completed to notify of pending service.   Per rep, will be entered manually and to check back in 2-3 business days to obtain auth reference number.   Geographical gap requested for service as [facility] out of network with [facility].   Service date range good from 06/18/14 - 09/16/14.

## 2014-06-18 NOTE — Progress Notes (Signed)
Pt being discharged home via EMS-foley intact and draining-discharge instructions reviewed with pt and placed in EMS envelope. No distress noted.

## 2014-06-19 ENCOUNTER — Telehealth: Payer: Self-pay | Admitting: Family Medicine

## 2014-06-19 DIAGNOSIS — M62462 Contracture of muscle, left lower leg: Secondary | ICD-10-CM | POA: Diagnosis not present

## 2014-06-19 DIAGNOSIS — L89153 Pressure ulcer of sacral region, stage 3: Secondary | ICD-10-CM | POA: Diagnosis not present

## 2014-06-19 DIAGNOSIS — M62461 Contracture of muscle, right lower leg: Secondary | ICD-10-CM | POA: Diagnosis not present

## 2014-06-19 DIAGNOSIS — L89152 Pressure ulcer of sacral region, stage 2: Secondary | ICD-10-CM | POA: Diagnosis not present

## 2014-06-19 DIAGNOSIS — M62421 Contracture of muscle, right upper arm: Secondary | ICD-10-CM | POA: Diagnosis not present

## 2014-06-19 LAB — CULTURE, BLOOD (ROUTINE X 2)
Culture: NO GROWTH
Culture: NO GROWTH

## 2014-06-19 NOTE — Telephone Encounter (Signed)
Please call nurse back at [phone]. Heather Hogan  Pt has wound on sacrum and nurse would like to continue treatment.  Does not look infected.

## 2014-06-19 NOTE — Telephone Encounter (Signed)
Called to [facility] [address] 

## 2014-06-20 ENCOUNTER — Telehealth: Payer: Self-pay

## 2014-06-20 NOTE — Telephone Encounter (Signed)
Dianna Child psychotherapist with [facility] left v/m; Dianna did not do home evaluation visit due to pt did not want social worker evaluation; information requested was sent to pt as instructed. This is FYI to Dr Ermalene Searing.

## 2014-06-20 NOTE — Telephone Encounter (Signed)
Please let me know what they need

## 2014-06-20 NOTE — Telephone Encounter (Signed)
Spoke with [employer].  She states she just needs verbal orders to continue with wound care.  He was recently in the hospital and when he was discharged home, the wound was worse.  She didn't feel like he was probably turned like he should of been while in the hospital.  Verbal okay given to continue with wound care.

## 2014-06-24 ENCOUNTER — Ambulatory Visit: Payer: Medicare Other | Admitting: Family Medicine

## 2014-06-24 DIAGNOSIS — L89152 Pressure ulcer of sacral region, stage 2: Secondary | ICD-10-CM | POA: Diagnosis not present

## 2014-06-24 DIAGNOSIS — M62461 Contracture of muscle, right lower leg: Secondary | ICD-10-CM | POA: Diagnosis not present

## 2014-06-24 DIAGNOSIS — M62462 Contracture of muscle, left lower leg: Secondary | ICD-10-CM | POA: Diagnosis not present

## 2014-06-24 DIAGNOSIS — L89153 Pressure ulcer of sacral region, stage 3: Secondary | ICD-10-CM | POA: Diagnosis not present

## 2014-06-24 DIAGNOSIS — M62421 Contracture of muscle, right upper arm: Secondary | ICD-10-CM | POA: Diagnosis not present

## 2014-06-25 LAB — TYPE AND SCREEN
ABO/RH(D): O NEG
Antibody Screen: NEGATIVE
UNIT DIVISION: 0
UNIT DIVISION: 0
Unit division: 0

## 2014-06-26 NOTE — Discharge Summary (Signed)
[facility] - [place] at [facility]   PATIENT NAME: Travis Cantrell    MR#:  [id number]  DATE OF BIRTH:  1960-02-23  DATE OF ADMISSION:  06/14/2014 ADMITTING PHYSICIAN: Arnaldo Natal, MD  DATE OF DISCHARGE: 06/18/2014  5:32 PM  PRIMARY CARE PHYSICIAN: Kerby Nora, MD    ADMISSION DIAGNOSIS:  Multiple sclerosis [G35] Urethral bleeding [N36.8] Sepsis with hypotension [A41.9]  DISCHARGE DIAGNOSIS:  Principal Problem:   Sepsis Active Problems:   UTI (urinary tract infection) due to urinary indwelling Foley catheter   SECONDARY DIAGNOSIS:   Past Medical History  Diagnosis Date  . Allergy   . GERD (gastroesophageal reflux disease)   . MS (multiple sclerosis)     HOSPITAL COURSE:  Brief History and physical: HPI: The patient presents to the emergency department via EMS after complaining of excessive bleeding from his penis. This occurred after his caretaker attempted to catheterize the patient. EMS reported a basin filled with blood at the patient's bedside in his home. In the emergency department urology was called to achieve hemostasis. Sometime following this procedure the patient's blood pressure dropped dramatically and he became diaphoretic. Due to his reported blood loss the emergency department treatment team transfuse 1 unit of O- blood. The patient was subsequently found to have a normal hemoglobin. Spectrum antibiotics were initiated after blood and urine cultures were obtained in the emergency department staff called for admission  Hospital course: 1. Sepsis: Meets criteria via leukocytosis, fever and tachycardia. The patient is currently hemodynamically stable  2. Urinary tract infection: Present on admission. The patient has received Rocephin as part of his antibiotic coverage in addition to vancomycin. D/ced with augmentin after d/w ID over phone per their recs -urology added oxybutrin for spasms Op f/u with urology dr.Brandon Chronic  indwelling foly cath to be changed Every 4 weeks  -3. Multiple sclerosis: Stable  4. Hypertension: Elevated blood pressure. Metoprolol is resumed and up titrated to 50 malleolus by mouth twice a day. Amlodipine is added to the regimen. Titrate as needed basis.  5. DVT prophylaxis: Heparin subcutaneously. To discontinue if patient restarts bleeding   DISCHARGE CONDITIONS:   satisfactory  CONSULTS OBTAINED:  Treatment Team:  Vanna Scotland, MD Clydie Braun, MD   PROCEDURES  Foley replacement  By urology  DRUG ALLERGIES:   Allergies  Allergen Reactions  . Baclofen Diarrhea    DISCHARGE MEDICATIONS:   Discharge Medication List as of 06/18/2014  2:56 PM    START taking these medications   Details  amLODipine (NORVASC) 5 MG tablet Take 1 tablet (5 mg total) by mouth daily., Starting 06/18/2014, Until Discontinued, Print    amoxicillin-clavulanate (AUGMENTIN) 875-125 MG per tablet Take 1 tablet by mouth 2 (two) times daily., Starting 06/18/2014, Until Sat 06/28/14, Print    docusate sodium (COLACE) 100 MG capsule Take 1 capsule (100 mg total) by mouth 2 (two) times daily., Starting 06/18/2014, Until Discontinued, OTC    metoprolol (LOPRESSOR) 50 MG tablet Take 1 tablet (50 mg total) by mouth 2 (two) times daily., Starting 06/18/2014, Until Discontinued, Print    oxybutynin (DITROPAN) 5 MG tablet Take 1 tablet (5 mg total) by mouth every 8 (eight) hours as needed for bladder spasms., Starting 06/18/2014, Until Discontinued, Print    saccharomyces boulardii (FLORASTOR) 250 MG capsule Take 1 capsule (250 mg total) by mouth 2 (two) times daily., Starting 06/18/2014, Until Discontinued, OTC      CONTINUE these medications which have NOT CHANGED   Details  atorvastatin (  LIPITOR) 40 MG tablet Take 1 tablet (40 mg total) by mouth daily., Starting 02/04/2014, Until Discontinued, Normal    Cholecalciferol (VITAMIN D-3 PO) Take 1 tablet by mouth daily. , Until Discontinued, Historical Med     FLUoxetine (PROZAC) 20 MG capsule TAKE TWO CAPSULES BY MOUTH IN THE MORNING AND ONE IN THE EVENING, Normal    gabapentin (NEURONTIN) 300 MG capsule TAKE THREE CAPSULES BY MOUTH THREE TIMES DAILY, Normal    ibuprofen (ADVIL,MOTRIN) 800 MG tablet TAKE ONE TABLET BY MOUTH EVERY 8 HOURS AS NEEDED, Normal    omeprazole (PRILOSEC) 20 MG capsule TAKE ONE CAPSULE BY MOUTH TWICE DAILY, Normal    diazepam (VALIUM) 5 MG tablet TAKE ONE TABLET BY MOUTH EVERY 12 HOURS AS NEEDED, Phone In    lisinopril (PRINIVIL,ZESTRIL) 10 MG tablet Take 1 tablet (10 mg total) by mouth daily., Starting 11/29/2013, Until Discontinued, Normal      STOP taking these medications     metoprolol succinate (TOPROL-XL) 25 MG 24 hr tablet      nystatin (MYCOSTATIN/NYSTOP) 100000 UNIT/GM POWD          DISCHARGE INSTRUCTIONS:  F/u with pcp in a week F/u with urology in a week F/u with ID   DIET:  LOW SALT  DISCHARGE CONDITION:  SATISFACTORY  ACTIVITY:  As tolerated  OXYGEN:  Home Oxygen: no     DISCHARGE LOCATION:  Home   If you experience worsening of your admission symptoms, develop shortness of breath, life threatening emergency, suicidal or homicidal thoughts you must seek medical attention immediately by calling 911 or calling your MD immediately  if symptoms less severe.  You Must read complete instructions/literature along with all the possible adverse reactions/side effects for all the Medicines you take and that have been prescribed to you. Take any new Medicines after you have completely understood and accpet all the possible adverse reactions/side effects.   Please note  You were cared for by a hospitalist during your hospital stay. If you have any questions about your discharge medications or the care you received while you were in the hospital after you are discharged, you can call the unit and asked to speak with the hospitalist on call if the hospitalist that took care of you is not  available. Once you are discharged, your primary care physician will handle any further medical issues. Please note that NO REFILLS for any discharge medications will be authorized once you are discharged, as it is imperative that you return to your primary care physician (or establish a relationship with a primary care physician if you do not have one) for your aftercare needs so that they can reassess your need for medications and monitor your lab values.     Today  Chief Complaint  Patient presents with  . Bleeding/Bruising   Feeling better  ROS:  CONSTITUTIONAL: Denies fevers, chills. Denies any fatigue, weakness.  EYES: Denies blurry vision, double vision, eye pain. EARS, NOSE, THROAT: Denies tinnitus, ear pain, hearing loss. RESPIRATORY: Denies cough, wheeze, shortness of breath.  CARDIOVASCULAR: Denies chest pain, palpitations, edema.  GASTROINTESTINAL: Denies nausea, vomiting, diarrhea, abdominal pain. Denies bright red blood per rectum. GENITOURINARY: Denies dysuria, hematuria. ENDOCRINE: Denies nocturia or thyroid problems. HEMATOLOGIC AND LYMPHATIC: Denies easy bruising or bleeding. SKIN: Denies rash or lesion. MUSCULOSKELETAL: Denies pain in neck, back, shoulder, knees, hips or arthritic symptoms.  NEUROLOGIC: Denies paralysis, paresthesias.  PSYCHIATRIC: Denies anxiety or depressive symptoms.   VITAL SIGNS:  Blood pressure 141/86, pulse 83, temperature  98.8 F (37.1 C), temperature source Oral, resp. rate 20, height  (1.778 m), weight 79.017 kg (174 lb 3.2 oz), SpO2 99 %.  I/O:  No intake or output data in the 24 hours ending 06/26/14 2211  PHYSICAL EXAMINATION:  GENERAL:  54 y.o.-year-old patient lying in the bed with no acute distress.  EYES: Pupils equal, round, reactive to light and accommodation. No scleral icterus. Extraocular muscles intact.  HEENT: Head atraumatic, normocephalic. Oropharynx and nasopharynx clear.  NECK:  Supple, no jugular venous  distention. No thyroid enlargement, no tenderness.  LUNGS: Normal breath sounds bilaterally, no wheezing, rales,rhonchi or crepitation. No use of accessory muscles of respiration.  CARDIOVASCULAR: S1, S2 normal. No murmurs, rubs, or gallops.  ABDOMEN: Soft, non-tender, non-distended. Bowel sounds present. No organomegaly or mass.  EXTREMITIES: No pedal edema, cyanosis, or clubbing.  NEUROLOGIC: Cranial nerves II through XII are intact. Muscle strength 5/5 in all extremities. Sensation intact. Gait not checked.  PSYCHIATRIC: The patient is alert and oriented x 3.  SKIN: No obvious rash, lesion, or ulcer.   DATA REVIEW:   CBC No results for input(s): WBC, HGB, HCT, PLT in the last 168 hours.  Chemistries  No results for input(s): NA, K, CL, CO2, GLUCOSE, BUN, CREATININE, CALCIUM, MG, AST, ALT, ALKPHOS, BILITOT in the last 168 hours.  Invalid input(s): GFRCGP  Cardiac Enzymes No results for input(s): TROPONINI in the last 168 hours.  Microbiology Results  Results for orders placed or performed during the hospital encounter of 06/14/14  Blood culture (routine x 2)     Status: None   Collection Time: 06/14/14 10:57 PM  Result Value Ref Range Status   Specimen Description BLOOD  Final   Special Requests NONE  Final   Culture NO GROWTH 5 DAYS  Final   Report Status 06/19/2014 FINAL  Final  Blood culture (routine x 2)     Status: None   Collection Time: 06/14/14 10:59 PM  Result Value Ref Range Status   Specimen Description BLOOD  Final   Special Requests NONE  Final   Culture NO GROWTH 5 DAYS  Final   Report Status 06/19/2014 FINAL  Final    RADIOLOGY:  No results found.  EKG:   Orders placed or performed during the hospital encounter of 06/14/14  . EKG 12-Lead  . EKG 12-Lead  . EKG      Management plans discussed with the patient, family and they are in agreement.  CODE STATUS:   TOTAL TIME TAKING CARE OF THIS PATIENT: 45 minutes.    @  on 06/26/2014 at 10:11  PM  Between 7am to 6pm - Pager - [phone]  After 6pm go to [email or web address] - password [email or web address] [facility]  [place] [facility]  Office  [phone]  CC: Primary care physician; Kerby Nora, MD

## 2014-06-27 DIAGNOSIS — M62462 Contracture of muscle, left lower leg: Secondary | ICD-10-CM | POA: Diagnosis not present

## 2014-06-27 DIAGNOSIS — F329 Major depressive disorder, single episode, unspecified: Secondary | ICD-10-CM | POA: Diagnosis not present

## 2014-06-27 DIAGNOSIS — S3091XD Unspecified superficial injury of lower back and pelvis, subsequent encounter: Secondary | ICD-10-CM | POA: Diagnosis not present

## 2014-06-27 DIAGNOSIS — Z993 Dependence on wheelchair: Secondary | ICD-10-CM | POA: Diagnosis not present

## 2014-06-27 DIAGNOSIS — E780 Pure hypercholesterolemia: Secondary | ICD-10-CM | POA: Diagnosis not present

## 2014-06-27 DIAGNOSIS — L89314 Pressure ulcer of right buttock, stage 4: Secondary | ICD-10-CM | POA: Diagnosis not present

## 2014-06-27 DIAGNOSIS — Z9181 History of falling: Secondary | ICD-10-CM | POA: Diagnosis not present

## 2014-06-27 DIAGNOSIS — M62421 Contracture of muscle, right upper arm: Secondary | ICD-10-CM | POA: Diagnosis not present

## 2014-06-27 DIAGNOSIS — G35 Multiple sclerosis: Secondary | ICD-10-CM | POA: Diagnosis not present

## 2014-06-27 DIAGNOSIS — N390 Urinary tract infection, site not specified: Secondary | ICD-10-CM | POA: Diagnosis not present

## 2014-06-27 DIAGNOSIS — M62461 Contracture of muscle, right lower leg: Secondary | ICD-10-CM | POA: Diagnosis not present

## 2014-06-27 DIAGNOSIS — Z466 Encounter for fitting and adjustment of urinary device: Secondary | ICD-10-CM | POA: Diagnosis not present

## 2014-06-27 DIAGNOSIS — K219 Gastro-esophageal reflux disease without esophagitis: Secondary | ICD-10-CM | POA: Diagnosis not present

## 2014-06-27 DIAGNOSIS — I10 Essential (primary) hypertension: Secondary | ICD-10-CM | POA: Diagnosis not present

## 2014-06-30 DIAGNOSIS — M62462 Contracture of muscle, left lower leg: Secondary | ICD-10-CM | POA: Diagnosis not present

## 2014-06-30 DIAGNOSIS — M62421 Contracture of muscle, right upper arm: Secondary | ICD-10-CM | POA: Diagnosis not present

## 2014-06-30 DIAGNOSIS — E780 Pure hypercholesterolemia: Secondary | ICD-10-CM | POA: Diagnosis not present

## 2014-06-30 DIAGNOSIS — Z466 Encounter for fitting and adjustment of urinary device: Secondary | ICD-10-CM | POA: Diagnosis not present

## 2014-06-30 DIAGNOSIS — Z9181 History of falling: Secondary | ICD-10-CM | POA: Diagnosis not present

## 2014-06-30 DIAGNOSIS — G35 Multiple sclerosis: Secondary | ICD-10-CM | POA: Diagnosis not present

## 2014-06-30 DIAGNOSIS — K219 Gastro-esophageal reflux disease without esophagitis: Secondary | ICD-10-CM | POA: Diagnosis not present

## 2014-06-30 DIAGNOSIS — Z993 Dependence on wheelchair: Secondary | ICD-10-CM | POA: Diagnosis not present

## 2014-06-30 DIAGNOSIS — L89314 Pressure ulcer of right buttock, stage 4: Secondary | ICD-10-CM | POA: Diagnosis not present

## 2014-06-30 DIAGNOSIS — S3091XD Unspecified superficial injury of lower back and pelvis, subsequent encounter: Secondary | ICD-10-CM | POA: Diagnosis not present

## 2014-06-30 DIAGNOSIS — F329 Major depressive disorder, single episode, unspecified: Secondary | ICD-10-CM | POA: Diagnosis not present

## 2014-06-30 DIAGNOSIS — I10 Essential (primary) hypertension: Secondary | ICD-10-CM | POA: Diagnosis not present

## 2014-06-30 DIAGNOSIS — N390 Urinary tract infection, site not specified: Secondary | ICD-10-CM | POA: Diagnosis not present

## 2014-06-30 DIAGNOSIS — M62461 Contracture of muscle, right lower leg: Secondary | ICD-10-CM | POA: Diagnosis not present

## 2014-07-01 ENCOUNTER — Telehealth: Payer: Self-pay

## 2014-07-01 DIAGNOSIS — I10 Essential (primary) hypertension: Secondary | ICD-10-CM | POA: Diagnosis not present

## 2014-07-01 DIAGNOSIS — M62421 Contracture of muscle, right upper arm: Secondary | ICD-10-CM | POA: Diagnosis not present

## 2014-07-01 DIAGNOSIS — S3091XD Unspecified superficial injury of lower back and pelvis, subsequent encounter: Secondary | ICD-10-CM | POA: Diagnosis not present

## 2014-07-01 DIAGNOSIS — M62462 Contracture of muscle, left lower leg: Secondary | ICD-10-CM | POA: Diagnosis not present

## 2014-07-01 DIAGNOSIS — L89314 Pressure ulcer of right buttock, stage 4: Secondary | ICD-10-CM | POA: Diagnosis not present

## 2014-07-01 DIAGNOSIS — G35 Multiple sclerosis: Secondary | ICD-10-CM | POA: Diagnosis not present

## 2014-07-01 DIAGNOSIS — F329 Major depressive disorder, single episode, unspecified: Secondary | ICD-10-CM | POA: Diagnosis not present

## 2014-07-01 DIAGNOSIS — Z993 Dependence on wheelchair: Secondary | ICD-10-CM | POA: Diagnosis not present

## 2014-07-01 DIAGNOSIS — M62461 Contracture of muscle, right lower leg: Secondary | ICD-10-CM | POA: Diagnosis not present

## 2014-07-01 DIAGNOSIS — E780 Pure hypercholesterolemia: Secondary | ICD-10-CM | POA: Diagnosis not present

## 2014-07-01 DIAGNOSIS — Z9181 History of falling: Secondary | ICD-10-CM | POA: Diagnosis not present

## 2014-07-01 DIAGNOSIS — N390 Urinary tract infection, site not specified: Secondary | ICD-10-CM | POA: Diagnosis not present

## 2014-07-01 DIAGNOSIS — K219 Gastro-esophageal reflux disease without esophagitis: Secondary | ICD-10-CM | POA: Diagnosis not present

## 2014-07-01 DIAGNOSIS — Z466 Encounter for fitting and adjustment of urinary device: Secondary | ICD-10-CM | POA: Diagnosis not present

## 2014-07-01 NOTE — Telephone Encounter (Signed)
Travis Cantrell with [employer] left v/m requesting status of form for urological supplies. Pt needing packing strips,foley catheter stabilization kits,2 way silicone catheter,leg bag and self adhesive dressing. Elizabeth request cb with status of form; pt in need of supplies ASAP. Ref # [id number].

## 2014-07-02 ENCOUNTER — Other Ambulatory Visit: Payer: Self-pay | Admitting: *Deleted

## 2014-07-02 DIAGNOSIS — L89152 Pressure ulcer of sacral region, stage 2: Secondary | ICD-10-CM | POA: Diagnosis not present

## 2014-07-02 DIAGNOSIS — R32 Unspecified urinary incontinence: Secondary | ICD-10-CM | POA: Diagnosis not present

## 2014-07-02 MED ORDER — ATORVASTATIN CALCIUM 40 MG PO TABS: 40.0000 mg | ORAL_TABLET | Freq: Every day | ORAL | Status: DC

## 2014-07-02 NOTE — Telephone Encounter (Addendum)
I have not seen any forms come in from [place] for Mr. Doreene Adas.  I called and requested that they re-fax forms.  Forms placed in Dr. Daphine Deutscher in box for completion.

## 2014-07-04 DIAGNOSIS — M62461 Contracture of muscle, right lower leg: Secondary | ICD-10-CM | POA: Diagnosis not present

## 2014-07-04 DIAGNOSIS — G35 Multiple sclerosis: Secondary | ICD-10-CM | POA: Diagnosis not present

## 2014-07-04 DIAGNOSIS — M62462 Contracture of muscle, left lower leg: Secondary | ICD-10-CM | POA: Diagnosis not present

## 2014-07-04 DIAGNOSIS — F329 Major depressive disorder, single episode, unspecified: Secondary | ICD-10-CM | POA: Diagnosis not present

## 2014-07-04 DIAGNOSIS — Z993 Dependence on wheelchair: Secondary | ICD-10-CM | POA: Diagnosis not present

## 2014-07-04 DIAGNOSIS — E780 Pure hypercholesterolemia: Secondary | ICD-10-CM | POA: Diagnosis not present

## 2014-07-04 DIAGNOSIS — L89314 Pressure ulcer of right buttock, stage 4: Secondary | ICD-10-CM | POA: Diagnosis not present

## 2014-07-04 DIAGNOSIS — Z466 Encounter for fitting and adjustment of urinary device: Secondary | ICD-10-CM | POA: Diagnosis not present

## 2014-07-04 DIAGNOSIS — I10 Essential (primary) hypertension: Secondary | ICD-10-CM | POA: Diagnosis not present

## 2014-07-04 DIAGNOSIS — Z9181 History of falling: Secondary | ICD-10-CM | POA: Diagnosis not present

## 2014-07-04 DIAGNOSIS — S3091XD Unspecified superficial injury of lower back and pelvis, subsequent encounter: Secondary | ICD-10-CM | POA: Diagnosis not present

## 2014-07-04 DIAGNOSIS — M62421 Contracture of muscle, right upper arm: Secondary | ICD-10-CM | POA: Diagnosis not present

## 2014-07-04 DIAGNOSIS — K219 Gastro-esophageal reflux disease without esophagitis: Secondary | ICD-10-CM | POA: Diagnosis not present

## 2014-07-04 DIAGNOSIS — N390 Urinary tract infection, site not specified: Secondary | ICD-10-CM | POA: Diagnosis not present

## 2014-07-05 ENCOUNTER — Other Ambulatory Visit: Payer: Self-pay | Admitting: Family Medicine

## 2014-07-05 DIAGNOSIS — I10 Essential (primary) hypertension: Secondary | ICD-10-CM | POA: Diagnosis not present

## 2014-07-05 DIAGNOSIS — L89314 Pressure ulcer of right buttock, stage 4: Secondary | ICD-10-CM | POA: Diagnosis not present

## 2014-07-05 DIAGNOSIS — E780 Pure hypercholesterolemia: Secondary | ICD-10-CM | POA: Diagnosis not present

## 2014-07-05 DIAGNOSIS — Z993 Dependence on wheelchair: Secondary | ICD-10-CM | POA: Diagnosis not present

## 2014-07-05 DIAGNOSIS — K219 Gastro-esophageal reflux disease without esophagitis: Secondary | ICD-10-CM | POA: Diagnosis not present

## 2014-07-05 DIAGNOSIS — M62461 Contracture of muscle, right lower leg: Secondary | ICD-10-CM | POA: Diagnosis not present

## 2014-07-05 DIAGNOSIS — M6281 Muscle weakness (generalized): Secondary | ICD-10-CM | POA: Diagnosis not present

## 2014-07-05 DIAGNOSIS — L89151 Pressure ulcer of sacral region, stage 1: Secondary | ICD-10-CM | POA: Diagnosis not present

## 2014-07-05 DIAGNOSIS — Z9181 History of falling: Secondary | ICD-10-CM | POA: Diagnosis not present

## 2014-07-05 DIAGNOSIS — L89301 Pressure ulcer of unspecified buttock, stage 1: Secondary | ICD-10-CM | POA: Diagnosis not present

## 2014-07-05 DIAGNOSIS — Z466 Encounter for fitting and adjustment of urinary device: Secondary | ICD-10-CM | POA: Diagnosis not present

## 2014-07-05 DIAGNOSIS — N390 Urinary tract infection, site not specified: Secondary | ICD-10-CM | POA: Diagnosis not present

## 2014-07-05 DIAGNOSIS — F329 Major depressive disorder, single episode, unspecified: Secondary | ICD-10-CM | POA: Diagnosis not present

## 2014-07-05 DIAGNOSIS — M62462 Contracture of muscle, left lower leg: Secondary | ICD-10-CM | POA: Diagnosis not present

## 2014-07-05 DIAGNOSIS — G35 Multiple sclerosis: Secondary | ICD-10-CM | POA: Diagnosis not present

## 2014-07-05 DIAGNOSIS — S3091XD Unspecified superficial injury of lower back and pelvis, subsequent encounter: Secondary | ICD-10-CM | POA: Diagnosis not present

## 2014-07-05 DIAGNOSIS — M62421 Contracture of muscle, right upper arm: Secondary | ICD-10-CM | POA: Diagnosis not present

## 2014-07-07 DIAGNOSIS — K219 Gastro-esophageal reflux disease without esophagitis: Secondary | ICD-10-CM | POA: Diagnosis not present

## 2014-07-07 DIAGNOSIS — F329 Major depressive disorder, single episode, unspecified: Secondary | ICD-10-CM | POA: Diagnosis not present

## 2014-07-07 DIAGNOSIS — Z466 Encounter for fitting and adjustment of urinary device: Secondary | ICD-10-CM | POA: Diagnosis not present

## 2014-07-07 DIAGNOSIS — G35 Multiple sclerosis: Secondary | ICD-10-CM | POA: Diagnosis not present

## 2014-07-07 DIAGNOSIS — Z993 Dependence on wheelchair: Secondary | ICD-10-CM | POA: Diagnosis not present

## 2014-07-07 DIAGNOSIS — S3091XD Unspecified superficial injury of lower back and pelvis, subsequent encounter: Secondary | ICD-10-CM | POA: Diagnosis not present

## 2014-07-07 DIAGNOSIS — L89314 Pressure ulcer of right buttock, stage 4: Secondary | ICD-10-CM | POA: Diagnosis not present

## 2014-07-07 DIAGNOSIS — M62461 Contracture of muscle, right lower leg: Secondary | ICD-10-CM | POA: Diagnosis not present

## 2014-07-07 DIAGNOSIS — M62421 Contracture of muscle, right upper arm: Secondary | ICD-10-CM | POA: Diagnosis not present

## 2014-07-07 DIAGNOSIS — M62462 Contracture of muscle, left lower leg: Secondary | ICD-10-CM | POA: Diagnosis not present

## 2014-07-07 DIAGNOSIS — E780 Pure hypercholesterolemia: Secondary | ICD-10-CM | POA: Diagnosis not present

## 2014-07-07 DIAGNOSIS — Z9181 History of falling: Secondary | ICD-10-CM | POA: Diagnosis not present

## 2014-07-07 DIAGNOSIS — I10 Essential (primary) hypertension: Secondary | ICD-10-CM | POA: Diagnosis not present

## 2014-07-07 DIAGNOSIS — N390 Urinary tract infection, site not specified: Secondary | ICD-10-CM | POA: Diagnosis not present

## 2014-07-08 ENCOUNTER — Telehealth: Payer: Self-pay

## 2014-07-08 DIAGNOSIS — L89314 Pressure ulcer of right buttock, stage 4: Secondary | ICD-10-CM | POA: Diagnosis not present

## 2014-07-08 DIAGNOSIS — M62421 Contracture of muscle, right upper arm: Secondary | ICD-10-CM | POA: Diagnosis not present

## 2014-07-08 DIAGNOSIS — M62461 Contracture of muscle, right lower leg: Secondary | ICD-10-CM | POA: Diagnosis not present

## 2014-07-08 DIAGNOSIS — S3091XD Unspecified superficial injury of lower back and pelvis, subsequent encounter: Secondary | ICD-10-CM | POA: Diagnosis not present

## 2014-07-08 DIAGNOSIS — Z993 Dependence on wheelchair: Secondary | ICD-10-CM | POA: Diagnosis not present

## 2014-07-08 DIAGNOSIS — F329 Major depressive disorder, single episode, unspecified: Secondary | ICD-10-CM | POA: Diagnosis not present

## 2014-07-08 DIAGNOSIS — K219 Gastro-esophageal reflux disease without esophagitis: Secondary | ICD-10-CM | POA: Diagnosis not present

## 2014-07-08 DIAGNOSIS — E780 Pure hypercholesterolemia: Secondary | ICD-10-CM | POA: Diagnosis not present

## 2014-07-08 DIAGNOSIS — N390 Urinary tract infection, site not specified: Secondary | ICD-10-CM | POA: Diagnosis not present

## 2014-07-08 DIAGNOSIS — Z466 Encounter for fitting and adjustment of urinary device: Secondary | ICD-10-CM | POA: Diagnosis not present

## 2014-07-08 DIAGNOSIS — Z9181 History of falling: Secondary | ICD-10-CM | POA: Diagnosis not present

## 2014-07-08 DIAGNOSIS — M62462 Contracture of muscle, left lower leg: Secondary | ICD-10-CM | POA: Diagnosis not present

## 2014-07-08 DIAGNOSIS — I10 Essential (primary) hypertension: Secondary | ICD-10-CM | POA: Diagnosis not present

## 2014-07-08 DIAGNOSIS — G35 Multiple sclerosis: Secondary | ICD-10-CM | POA: Diagnosis not present

## 2014-07-08 NOTE — Telephone Encounter (Signed)
Pt called to verify appt on 07/10/14 at 3 pm with Dr Ermalene Searing and pt wanted to know if we had arranged transportation for visit. Spoke with Jae Dire at [employer] and transportation at [phone] and she said if pt wants to come with power chair she will contact [employer] and get price and then Jae Dire will contact pt with cost. Pt voiced understanding and will cb if needed.

## 2014-07-10 ENCOUNTER — Ambulatory Visit: Payer: Medicare Other | Admitting: Family Medicine

## 2014-07-10 DIAGNOSIS — M62462 Contracture of muscle, left lower leg: Secondary | ICD-10-CM | POA: Diagnosis not present

## 2014-07-10 DIAGNOSIS — S3091XD Unspecified superficial injury of lower back and pelvis, subsequent encounter: Secondary | ICD-10-CM | POA: Diagnosis not present

## 2014-07-10 DIAGNOSIS — G35 Multiple sclerosis: Secondary | ICD-10-CM | POA: Diagnosis not present

## 2014-07-10 DIAGNOSIS — M62421 Contracture of muscle, right upper arm: Secondary | ICD-10-CM | POA: Diagnosis not present

## 2014-07-10 DIAGNOSIS — M62461 Contracture of muscle, right lower leg: Secondary | ICD-10-CM | POA: Diagnosis not present

## 2014-07-10 DIAGNOSIS — Z993 Dependence on wheelchair: Secondary | ICD-10-CM | POA: Diagnosis not present

## 2014-07-10 DIAGNOSIS — Z9181 History of falling: Secondary | ICD-10-CM | POA: Diagnosis not present

## 2014-07-10 DIAGNOSIS — L89314 Pressure ulcer of right buttock, stage 4: Secondary | ICD-10-CM | POA: Diagnosis not present

## 2014-07-10 DIAGNOSIS — K219 Gastro-esophageal reflux disease without esophagitis: Secondary | ICD-10-CM | POA: Diagnosis not present

## 2014-07-10 DIAGNOSIS — Z466 Encounter for fitting and adjustment of urinary device: Secondary | ICD-10-CM | POA: Diagnosis not present

## 2014-07-10 DIAGNOSIS — N390 Urinary tract infection, site not specified: Secondary | ICD-10-CM | POA: Diagnosis not present

## 2014-07-10 DIAGNOSIS — F329 Major depressive disorder, single episode, unspecified: Secondary | ICD-10-CM | POA: Diagnosis not present

## 2014-07-10 DIAGNOSIS — E780 Pure hypercholesterolemia: Secondary | ICD-10-CM | POA: Diagnosis not present

## 2014-07-10 DIAGNOSIS — I10 Essential (primary) hypertension: Secondary | ICD-10-CM | POA: Diagnosis not present

## 2014-07-11 ENCOUNTER — Ambulatory Visit: Payer: Medicare Other | Admitting: Family Medicine

## 2014-07-11 ENCOUNTER — Ambulatory Visit: Payer: Self-pay

## 2014-07-11 DIAGNOSIS — Z466 Encounter for fitting and adjustment of urinary device: Secondary | ICD-10-CM | POA: Diagnosis not present

## 2014-07-11 DIAGNOSIS — M62462 Contracture of muscle, left lower leg: Secondary | ICD-10-CM | POA: Diagnosis not present

## 2014-07-11 DIAGNOSIS — M62461 Contracture of muscle, right lower leg: Secondary | ICD-10-CM | POA: Diagnosis not present

## 2014-07-11 DIAGNOSIS — E780 Pure hypercholesterolemia: Secondary | ICD-10-CM | POA: Diagnosis not present

## 2014-07-11 DIAGNOSIS — K219 Gastro-esophageal reflux disease without esophagitis: Secondary | ICD-10-CM | POA: Diagnosis not present

## 2014-07-11 DIAGNOSIS — L89314 Pressure ulcer of right buttock, stage 4: Secondary | ICD-10-CM | POA: Diagnosis not present

## 2014-07-11 DIAGNOSIS — N390 Urinary tract infection, site not specified: Secondary | ICD-10-CM | POA: Diagnosis not present

## 2014-07-11 DIAGNOSIS — Z993 Dependence on wheelchair: Secondary | ICD-10-CM | POA: Diagnosis not present

## 2014-07-11 DIAGNOSIS — Z9181 History of falling: Secondary | ICD-10-CM | POA: Diagnosis not present

## 2014-07-11 DIAGNOSIS — I10 Essential (primary) hypertension: Secondary | ICD-10-CM | POA: Diagnosis not present

## 2014-07-11 DIAGNOSIS — S3091XD Unspecified superficial injury of lower back and pelvis, subsequent encounter: Secondary | ICD-10-CM | POA: Diagnosis not present

## 2014-07-11 DIAGNOSIS — G35 Multiple sclerosis: Secondary | ICD-10-CM | POA: Diagnosis not present

## 2014-07-11 DIAGNOSIS — F329 Major depressive disorder, single episode, unspecified: Secondary | ICD-10-CM | POA: Diagnosis not present

## 2014-07-11 DIAGNOSIS — M62421 Contracture of muscle, right upper arm: Secondary | ICD-10-CM | POA: Diagnosis not present

## 2014-07-14 DIAGNOSIS — E780 Pure hypercholesterolemia: Secondary | ICD-10-CM | POA: Diagnosis not present

## 2014-07-14 DIAGNOSIS — S3091XD Unspecified superficial injury of lower back and pelvis, subsequent encounter: Secondary | ICD-10-CM | POA: Diagnosis not present

## 2014-07-14 DIAGNOSIS — M62462 Contracture of muscle, left lower leg: Secondary | ICD-10-CM | POA: Diagnosis not present

## 2014-07-14 DIAGNOSIS — G35 Multiple sclerosis: Secondary | ICD-10-CM | POA: Diagnosis not present

## 2014-07-14 DIAGNOSIS — K219 Gastro-esophageal reflux disease without esophagitis: Secondary | ICD-10-CM | POA: Diagnosis not present

## 2014-07-14 DIAGNOSIS — Z9181 History of falling: Secondary | ICD-10-CM | POA: Diagnosis not present

## 2014-07-14 DIAGNOSIS — F329 Major depressive disorder, single episode, unspecified: Secondary | ICD-10-CM | POA: Diagnosis not present

## 2014-07-14 DIAGNOSIS — N390 Urinary tract infection, site not specified: Secondary | ICD-10-CM | POA: Diagnosis not present

## 2014-07-14 DIAGNOSIS — I10 Essential (primary) hypertension: Secondary | ICD-10-CM | POA: Diagnosis not present

## 2014-07-14 DIAGNOSIS — M62421 Contracture of muscle, right upper arm: Secondary | ICD-10-CM | POA: Diagnosis not present

## 2014-07-14 DIAGNOSIS — Z466 Encounter for fitting and adjustment of urinary device: Secondary | ICD-10-CM | POA: Diagnosis not present

## 2014-07-14 DIAGNOSIS — Z993 Dependence on wheelchair: Secondary | ICD-10-CM | POA: Diagnosis not present

## 2014-07-14 DIAGNOSIS — M62461 Contracture of muscle, right lower leg: Secondary | ICD-10-CM | POA: Diagnosis not present

## 2014-07-14 DIAGNOSIS — L89314 Pressure ulcer of right buttock, stage 4: Secondary | ICD-10-CM | POA: Diagnosis not present

## 2014-07-14 NOTE — Telephone Encounter (Signed)
Form on your desk to complete

## 2014-07-17 DIAGNOSIS — M62461 Contracture of muscle, right lower leg: Secondary | ICD-10-CM | POA: Diagnosis not present

## 2014-07-17 DIAGNOSIS — L89314 Pressure ulcer of right buttock, stage 4: Secondary | ICD-10-CM | POA: Diagnosis not present

## 2014-07-17 DIAGNOSIS — M62462 Contracture of muscle, left lower leg: Secondary | ICD-10-CM | POA: Diagnosis not present

## 2014-07-17 DIAGNOSIS — F329 Major depressive disorder, single episode, unspecified: Secondary | ICD-10-CM | POA: Diagnosis not present

## 2014-07-17 DIAGNOSIS — Z9181 History of falling: Secondary | ICD-10-CM | POA: Diagnosis not present

## 2014-07-17 DIAGNOSIS — Z466 Encounter for fitting and adjustment of urinary device: Secondary | ICD-10-CM | POA: Diagnosis not present

## 2014-07-17 DIAGNOSIS — G35 Multiple sclerosis: Secondary | ICD-10-CM | POA: Diagnosis not present

## 2014-07-17 DIAGNOSIS — Z993 Dependence on wheelchair: Secondary | ICD-10-CM | POA: Diagnosis not present

## 2014-07-17 DIAGNOSIS — N390 Urinary tract infection, site not specified: Secondary | ICD-10-CM | POA: Diagnosis not present

## 2014-07-17 DIAGNOSIS — M62421 Contracture of muscle, right upper arm: Secondary | ICD-10-CM | POA: Diagnosis not present

## 2014-07-17 DIAGNOSIS — K219 Gastro-esophageal reflux disease without esophagitis: Secondary | ICD-10-CM | POA: Diagnosis not present

## 2014-07-17 DIAGNOSIS — E780 Pure hypercholesterolemia: Secondary | ICD-10-CM | POA: Diagnosis not present

## 2014-07-17 DIAGNOSIS — S3091XD Unspecified superficial injury of lower back and pelvis, subsequent encounter: Secondary | ICD-10-CM | POA: Diagnosis not present

## 2014-07-17 DIAGNOSIS — I10 Essential (primary) hypertension: Secondary | ICD-10-CM | POA: Diagnosis not present

## 2014-07-18 ENCOUNTER — Ambulatory Visit: Payer: Medicare Other | Admitting: Family Medicine

## 2014-07-21 DIAGNOSIS — M62421 Contracture of muscle, right upper arm: Secondary | ICD-10-CM | POA: Diagnosis not present

## 2014-07-21 DIAGNOSIS — E780 Pure hypercholesterolemia: Secondary | ICD-10-CM | POA: Diagnosis not present

## 2014-07-21 DIAGNOSIS — Z9181 History of falling: Secondary | ICD-10-CM | POA: Diagnosis not present

## 2014-07-21 DIAGNOSIS — M62461 Contracture of muscle, right lower leg: Secondary | ICD-10-CM | POA: Diagnosis not present

## 2014-07-21 DIAGNOSIS — L89314 Pressure ulcer of right buttock, stage 4: Secondary | ICD-10-CM | POA: Diagnosis not present

## 2014-07-21 DIAGNOSIS — G35 Multiple sclerosis: Secondary | ICD-10-CM | POA: Diagnosis not present

## 2014-07-21 DIAGNOSIS — M62462 Contracture of muscle, left lower leg: Secondary | ICD-10-CM | POA: Diagnosis not present

## 2014-07-21 DIAGNOSIS — Z466 Encounter for fitting and adjustment of urinary device: Secondary | ICD-10-CM | POA: Diagnosis not present

## 2014-07-21 DIAGNOSIS — Z993 Dependence on wheelchair: Secondary | ICD-10-CM | POA: Diagnosis not present

## 2014-07-21 DIAGNOSIS — F329 Major depressive disorder, single episode, unspecified: Secondary | ICD-10-CM | POA: Diagnosis not present

## 2014-07-21 DIAGNOSIS — I10 Essential (primary) hypertension: Secondary | ICD-10-CM | POA: Diagnosis not present

## 2014-07-21 DIAGNOSIS — N390 Urinary tract infection, site not specified: Secondary | ICD-10-CM | POA: Diagnosis not present

## 2014-07-21 DIAGNOSIS — S3091XD Unspecified superficial injury of lower back and pelvis, subsequent encounter: Secondary | ICD-10-CM | POA: Diagnosis not present

## 2014-07-21 DIAGNOSIS — K219 Gastro-esophageal reflux disease without esophagitis: Secondary | ICD-10-CM | POA: Diagnosis not present

## 2014-07-23 ENCOUNTER — Ambulatory Visit: Payer: Self-pay

## 2014-07-24 ENCOUNTER — Encounter: Payer: Self-pay | Admitting: Family Medicine

## 2014-07-24 ENCOUNTER — Other Ambulatory Visit: Payer: Self-pay | Admitting: Family Medicine

## 2014-07-24 ENCOUNTER — Ambulatory Visit (INDEPENDENT_AMBULATORY_CARE_PROVIDER_SITE_OTHER): Payer: Medicare Other | Admitting: Family Medicine

## 2014-07-24 VITALS — BP 138/84 | HR 49 | Temp 98.2°F

## 2014-07-24 DIAGNOSIS — Z9889 Other specified postprocedural states: Secondary | ICD-10-CM

## 2014-07-24 DIAGNOSIS — A4159 Other Gram-negative sepsis: Secondary | ICD-10-CM

## 2014-07-24 DIAGNOSIS — I10 Essential (primary) hypertension: Secondary | ICD-10-CM | POA: Diagnosis not present

## 2014-07-24 DIAGNOSIS — N390 Urinary tract infection, site not specified: Secondary | ICD-10-CM

## 2014-07-24 DIAGNOSIS — Z960 Presence of urogenital implants: Secondary | ICD-10-CM

## 2014-07-24 DIAGNOSIS — A414 Sepsis due to anaerobes: Secondary | ICD-10-CM

## 2014-07-24 DIAGNOSIS — H1010 Acute atopic conjunctivitis, unspecified eye: Secondary | ICD-10-CM | POA: Insufficient documentation

## 2014-07-24 DIAGNOSIS — T8351XA Infection and inflammatory reaction due to indwelling urinary catheter, initial encounter: Secondary | ICD-10-CM

## 2014-07-24 DIAGNOSIS — G35 Multiple sclerosis: Secondary | ICD-10-CM

## 2014-07-24 DIAGNOSIS — H1013 Acute atopic conjunctivitis, bilateral: Secondary | ICD-10-CM

## 2014-07-24 DIAGNOSIS — T83511A Infection and inflammatory reaction due to indwelling urethral catheter, initial encounter: Secondary | ICD-10-CM

## 2014-07-24 NOTE — Assessment & Plan Note (Signed)
Resolved status post antibiotics. 

## 2014-07-24 NOTE — Assessment & Plan Note (Signed)
Recommend trial of oral antihistamine.

## 2014-07-24 NOTE — Assessment & Plan Note (Signed)
No recent cahnge on no med.

## 2014-07-24 NOTE — Progress Notes (Signed)
   Subjective:    Patient ID: Travis Cantrell, male    DOB: 12/31/1960, 54 y.o.   MRN: [id number]  HPI   54 year old male  With severe progressive MS in wheelchair presents for  [facility] hospital  follow up.   Hospitalization was  On 5/21 to 06/19/2014 for urethral bleeding and urosepsis with hypotension. Occurred after traumatic placement by caregiver of foley.  He was treated with antibiotics  (vanc and D/C's with augmentin after discussion wityh ID)and foley catheter place.  He  Never followed up with uro, Dr. Apolinar Junes. Has seen Dr. Ronal Fear in past. He plans to look into suprapubic cath.  No current problems with urine flow.  Has nurse coming tommorow to change Foley.   BP well controlled .on current regimen BP Readings from Last 3 Encounters:  07/24/14 138/84  06/18/14 141/86  11/19/13 119/84      Review of Systems  Constitutional: Negative for fever and fatigue.  HENT: Negative for ear pain.   Eyes: Negative for pain.  Respiratory: Negative for cough and shortness of breath.   Cardiovascular: Negative for chest pain and leg swelling.  Gastrointestinal: Negative for abdominal pain, diarrhea and constipation.  Genitourinary: Negative for penile swelling and penile pain.  Psychiatric/Behavioral: Negative for dysphoric mood.   Occ eye watering, itching.    Objective:   Physical Exam  Constitutional: He is oriented to person, place, and time.  Wheel chair bound, slumped over forward and to left in chair. Difficulty keeping head up Unkempt  HENT:  Head: Normocephalic and atraumatic.  Right Ear: External ear normal. No middle ear effusion.  Left Ear: External ear normal.  No middle ear effusion.  Nose: Nose normal. No mucosal edema or sinus tenderness. Right sinus exhibits no maxillary sinus tenderness. Left sinus exhibits no maxillary sinus tenderness.  Mouth/Throat: Oropharynx is clear and moist. No posterior oropharyngeal edema or posterior oropharyngeal erythema.  Eyes:  Conjunctivae and EOM are normal. Pupils are equal, round, and reactive to light.  Neck: Normal range of motion. Neck supple. No thyromegaly present.  Cardiovascular: Normal rate, regular rhythm, normal heart sounds and intact distal pulses.  Exam reveals no gallop and no friction rub.   No murmur heard. Pulmonary/Chest: Effort normal and breath sounds normal. No respiratory distress. He has no wheezes. He has no rales. He exhibits no tenderness.  Abdominal: Soft.  Neurological: He is alert and oriented to person, place, and time. He displays atrophy. He exhibits abnormal muscle tone.  He has 0/5 strenght in B Lext and 2-3/5 strength in upper extremities.. Most strength in left arm. Contractures in right arm that cause pain.  Skin: Skin is warm.  B toenails thickened deformed and discolored.          Assessment & Plan:

## 2014-07-24 NOTE — Telephone Encounter (Signed)
Last office visit 07/24/2014.  Last refilled 05/07/2014 #360 with no refills.  Ok to refill?

## 2014-07-24 NOTE — Assessment & Plan Note (Signed)
Will have catheter changed tommorow by home health nurse.  Plans return to Dr. Ronal Fear for consideration of suprapubic catheter.

## 2014-07-24 NOTE — Assessment & Plan Note (Signed)
Well controlled. Continue current medication.  

## 2014-07-24 NOTE — Patient Instructions (Addendum)
Have foley cath changed by nurse at home tommorow.  Call to schedule appt with Dr. Ronal Fear [phone]. Can try OTC claritin for watering  Itchy eyes.

## 2014-07-24 NOTE — Progress Notes (Signed)
Pre visit review using our clinic review tool, if applicable. No additional management support is needed unless otherwise documented below in the visit note. 

## 2014-07-25 DIAGNOSIS — M62462 Contracture of muscle, left lower leg: Secondary | ICD-10-CM | POA: Diagnosis not present

## 2014-07-25 DIAGNOSIS — Z466 Encounter for fitting and adjustment of urinary device: Secondary | ICD-10-CM | POA: Diagnosis not present

## 2014-07-25 DIAGNOSIS — I10 Essential (primary) hypertension: Secondary | ICD-10-CM | POA: Diagnosis not present

## 2014-07-25 DIAGNOSIS — E780 Pure hypercholesterolemia: Secondary | ICD-10-CM | POA: Diagnosis not present

## 2014-07-25 DIAGNOSIS — F329 Major depressive disorder, single episode, unspecified: Secondary | ICD-10-CM | POA: Diagnosis not present

## 2014-07-25 DIAGNOSIS — Z993 Dependence on wheelchair: Secondary | ICD-10-CM | POA: Diagnosis not present

## 2014-07-25 DIAGNOSIS — N390 Urinary tract infection, site not specified: Secondary | ICD-10-CM | POA: Diagnosis not present

## 2014-07-25 DIAGNOSIS — M62421 Contracture of muscle, right upper arm: Secondary | ICD-10-CM | POA: Diagnosis not present

## 2014-07-25 DIAGNOSIS — S3091XD Unspecified superficial injury of lower back and pelvis, subsequent encounter: Secondary | ICD-10-CM | POA: Diagnosis not present

## 2014-07-25 DIAGNOSIS — G35 Multiple sclerosis: Secondary | ICD-10-CM | POA: Diagnosis not present

## 2014-07-25 DIAGNOSIS — L89314 Pressure ulcer of right buttock, stage 4: Secondary | ICD-10-CM | POA: Diagnosis not present

## 2014-07-25 DIAGNOSIS — M62461 Contracture of muscle, right lower leg: Secondary | ICD-10-CM | POA: Diagnosis not present

## 2014-07-25 DIAGNOSIS — K219 Gastro-esophageal reflux disease without esophagitis: Secondary | ICD-10-CM | POA: Diagnosis not present

## 2014-07-25 DIAGNOSIS — Z9181 History of falling: Secondary | ICD-10-CM | POA: Diagnosis not present

## 2014-08-01 ENCOUNTER — Telehealth: Payer: Self-pay

## 2014-08-01 ENCOUNTER — Inpatient Hospital Stay
Admission: EM | Admit: 2014-08-01 | Discharge: 2014-08-04 | DRG: 698 | Disposition: A | Discharge status: Home Health | Payer: Medicare Other | Attending: Internal Medicine | Admitting: Internal Medicine

## 2014-08-01 ENCOUNTER — Emergency Department: Payer: Medicare Other

## 2014-08-01 DIAGNOSIS — Z7401 Bed confinement status: Secondary | ICD-10-CM | POA: Diagnosis not present

## 2014-08-01 DIAGNOSIS — I10 Essential (primary) hypertension: Secondary | ICD-10-CM | POA: Diagnosis present

## 2014-08-01 DIAGNOSIS — R531 Weakness: Secondary | ICD-10-CM | POA: Diagnosis not present

## 2014-08-01 DIAGNOSIS — E785 Hyperlipidemia, unspecified: Secondary | ICD-10-CM | POA: Diagnosis present

## 2014-08-01 DIAGNOSIS — L89154 Pressure ulcer of sacral region, stage 4: Secondary | ICD-10-CM | POA: Diagnosis present

## 2014-08-01 DIAGNOSIS — Y846 Urinary catheterization as the cause of abnormal reaction of the patient, or of later complication, without mention of misadventure at the time of the procedure: Secondary | ICD-10-CM | POA: Diagnosis present

## 2014-08-01 DIAGNOSIS — E780 Pure hypercholesterolemia: Secondary | ICD-10-CM | POA: Diagnosis not present

## 2014-08-01 DIAGNOSIS — Z833 Family history of diabetes mellitus: Secondary | ICD-10-CM | POA: Diagnosis not present

## 2014-08-01 DIAGNOSIS — E872 Acidosis: Secondary | ICD-10-CM | POA: Diagnosis not present

## 2014-08-01 DIAGNOSIS — T8351XA Infection and inflammatory reaction due to indwelling urinary catheter, initial encounter: Secondary | ICD-10-CM | POA: Diagnosis not present

## 2014-08-01 DIAGNOSIS — A419 Sepsis, unspecified organism: Secondary | ICD-10-CM | POA: Diagnosis not present

## 2014-08-01 DIAGNOSIS — Z8249 Family history of ischemic heart disease and other diseases of the circulatory system: Secondary | ICD-10-CM

## 2014-08-01 DIAGNOSIS — B9689 Other specified bacterial agents as the cause of diseases classified elsewhere: Secondary | ICD-10-CM | POA: Diagnosis present

## 2014-08-01 DIAGNOSIS — B9561 Methicillin susceptible Staphylococcus aureus infection as the cause of diseases classified elsewhere: Secondary | ICD-10-CM | POA: Diagnosis present

## 2014-08-01 DIAGNOSIS — L89301 Pressure ulcer of unspecified buttock, stage 1: Secondary | ICD-10-CM | POA: Diagnosis not present

## 2014-08-01 DIAGNOSIS — G35 Multiple sclerosis: Secondary | ICD-10-CM | POA: Diagnosis not present

## 2014-08-01 DIAGNOSIS — Z466 Encounter for fitting and adjustment of urinary device: Secondary | ICD-10-CM | POA: Diagnosis not present

## 2014-08-01 DIAGNOSIS — L89151 Pressure ulcer of sacral region, stage 1: Secondary | ICD-10-CM | POA: Diagnosis not present

## 2014-08-01 DIAGNOSIS — B965 Pseudomonas (aeruginosa) (mallei) (pseudomallei) as the cause of diseases classified elsewhere: Secondary | ICD-10-CM | POA: Diagnosis present

## 2014-08-01 DIAGNOSIS — T83511A Infection and inflammatory reaction due to indwelling urethral catheter, initial encounter: Secondary | ICD-10-CM

## 2014-08-01 DIAGNOSIS — S3091XD Unspecified superficial injury of lower back and pelvis, subsequent encounter: Secondary | ICD-10-CM | POA: Diagnosis not present

## 2014-08-01 DIAGNOSIS — R509 Fever, unspecified: Secondary | ICD-10-CM | POA: Diagnosis not present

## 2014-08-01 DIAGNOSIS — M6281 Muscle weakness (generalized): Secondary | ICD-10-CM | POA: Diagnosis not present

## 2014-08-01 DIAGNOSIS — Z807 Family history of other malignant neoplasms of lymphoid, hematopoietic and related tissues: Secondary | ICD-10-CM

## 2014-08-01 DIAGNOSIS — N390 Urinary tract infection, site not specified: Secondary | ICD-10-CM | POA: Diagnosis present

## 2014-08-01 DIAGNOSIS — F329 Major depressive disorder, single episode, unspecified: Secondary | ICD-10-CM | POA: Diagnosis not present

## 2014-08-01 DIAGNOSIS — Z993 Dependence on wheelchair: Secondary | ICD-10-CM | POA: Diagnosis not present

## 2014-08-01 DIAGNOSIS — Z9181 History of falling: Secondary | ICD-10-CM | POA: Diagnosis not present

## 2014-08-01 DIAGNOSIS — M62421 Contracture of muscle, right upper arm: Secondary | ICD-10-CM | POA: Diagnosis not present

## 2014-08-01 DIAGNOSIS — R6889 Other general symptoms and signs: Secondary | ICD-10-CM | POA: Diagnosis not present

## 2014-08-01 DIAGNOSIS — L8990 Pressure ulcer of unspecified site, unspecified stage: Secondary | ICD-10-CM | POA: Insufficient documentation

## 2014-08-01 DIAGNOSIS — R32 Unspecified urinary incontinence: Secondary | ICD-10-CM | POA: Diagnosis not present

## 2014-08-01 DIAGNOSIS — K219 Gastro-esophageal reflux disease without esophagitis: Secondary | ICD-10-CM | POA: Diagnosis present

## 2014-08-01 DIAGNOSIS — L89314 Pressure ulcer of right buttock, stage 4: Secondary | ICD-10-CM | POA: Diagnosis not present

## 2014-08-01 DIAGNOSIS — M62462 Contracture of muscle, left lower leg: Secondary | ICD-10-CM | POA: Diagnosis not present

## 2014-08-01 DIAGNOSIS — M62461 Contracture of muscle, right lower leg: Secondary | ICD-10-CM | POA: Diagnosis not present

## 2014-08-01 HISTORY — DX: Essential (primary) hypertension: I10

## 2014-08-01 LAB — CBC WITH DIFFERENTIAL/PLATELET
BASOS ABS: 0 10*3/uL (ref 0–0.1)
Basophils Relative: 0 %
EOS ABS: 0.1 10*3/uL (ref 0–0.7)
EOS PCT: 0 %
HEMATOCRIT: 42.8 % (ref 40.0–52.0)
HEMOGLOBIN: 13.9 g/dL (ref 13.0–18.0)
Lymphocytes Relative: 7 %
Lymphs Abs: 1.7 10*3/uL (ref 1.0–3.6)
MCH: 27.8 pg (ref 26.0–34.0)
MCHC: 32.6 g/dL (ref 32.0–36.0)
MCV: 85.4 fL (ref 80.0–100.0)
Monocytes Absolute: 1.6 10*3/uL — ABNORMAL HIGH (ref 0.2–1.0)
Monocytes Relative: 6 %
Neutro Abs: 22.2 10*3/uL — ABNORMAL HIGH (ref 1.4–6.5)
Neutrophils Relative %: 87 %
Platelets: 315 10*3/uL (ref 150–440)
RBC: 5.01 MIL/uL (ref 4.40–5.90)
RDW: 13.8 % (ref 11.5–14.5)
WBC: 25.5 10*3/uL — AB (ref 3.8–10.6)

## 2014-08-01 LAB — COMPREHENSIVE METABOLIC PANEL
ALT: 13 U/L — ABNORMAL LOW (ref 17–63)
ANION GAP: 10 (ref 5–15)
AST: 22 U/L (ref 15–41)
Albumin: 3.7 g/dL (ref 3.5–5.0)
Alkaline Phosphatase: 86 U/L (ref 38–126)
BUN: 23 mg/dL — ABNORMAL HIGH (ref 6–20)
CO2: 23 mmol/L (ref 22–32)
Calcium: 9.2 mg/dL (ref 8.9–10.3)
Chloride: 102 mmol/L (ref 101–111)
Creatinine, Ser: 0.89 mg/dL (ref 0.61–1.24)
GFR calc Af Amer: >60 mL/min (ref >60)
GFR calc non Af Amer: >60 mL/min (ref >60)
Glucose, Bld: 136 mg/dL — ABNORMAL HIGH (ref 65–99)
POTASSIUM: 3.8 mmol/L (ref 3.5–5.1)
Sodium: 135 mmol/L (ref 135–145)
TOTAL PROTEIN: 7.3 g/dL (ref 6.5–8.1)
Total Bilirubin: 1.4 mg/dL — ABNORMAL HIGH (ref 0.3–1.2)

## 2014-08-01 LAB — URINALYSIS COMPLETE WITH MICROSCOPIC (ARMC ONLY)
Bilirubin Urine: NEGATIVE
GLUCOSE, UA: NEGATIVE mg/dL
Ketones, ur: NEGATIVE mg/dL
Nitrite: NEGATIVE
Protein, ur: 100 mg/dL — AB
Specific Gravity, Urine: 1.017 (ref 1.005–1.030)
pH: 5 (ref 5.0–8.0)

## 2014-08-01 LAB — LACTIC ACID, PLASMA
Lactic Acid, Venous: 3 mmol/L (ref 0.5–2.0)
Lactic Acid, Venous: 2.3 mmol/L (ref 0.5–2.0)

## 2014-08-01 MED ORDER — DOCUSATE SODIUM 100 MG PO CAPS: 100.0000 mg | ORAL_CAPSULE | Freq: Two times a day (BID) | ORAL | Status: DC

## 2014-08-01 MED ORDER — LISINOPRIL 10 MG PO TABS
10.0000 mg | ORAL_TABLET | Freq: Every day | ORAL | Status: DC
  Administered 2014-08-02: 10 mg via ORAL
  Filled 2014-08-01 (×2): qty 1

## 2014-08-01 MED ORDER — ATORVASTATIN CALCIUM 20 MG PO TABS
40.0000 mg | ORAL_TABLET | Freq: Every day | ORAL | Status: DC
  Administered 2014-08-01 – 2014-08-04 (×4): 40 mg via ORAL
  Filled 2014-08-01 (×4): qty 2

## 2014-08-01 MED ORDER — ACETAMINOPHEN 650 MG RE SUPP: 650.0000 mg | Freq: Four times a day (QID) | RECTAL | Status: DC | PRN

## 2014-08-01 MED ORDER — GABAPENTIN 300 MG PO CAPS
900.0000 mg | ORAL_CAPSULE | Freq: Three times a day (TID) | ORAL | Status: DC
  Administered 2014-08-01 – 2014-08-04 (×9): 900 mg via ORAL
  Filled 2014-08-01 (×10): qty 3

## 2014-08-01 MED ORDER — ENOXAPARIN SODIUM 40 MG/0.4ML ~~LOC~~ SOLN
40.0000 mg | SUBCUTANEOUS | Status: DC
  Administered 2014-08-01 – 2014-08-03 (×3): 40 mg via SUBCUTANEOUS
  Filled 2014-08-01 (×3): qty 0.4

## 2014-08-01 MED ORDER — PANTOPRAZOLE SODIUM 40 MG PO TBEC
40.0000 mg | DELAYED_RELEASE_TABLET | Freq: Every day | ORAL | Status: DC
  Administered 2014-08-01 – 2014-08-04 (×4): 40 mg via ORAL
  Filled 2014-08-01 (×4): qty 1

## 2014-08-01 MED ORDER — FLUOXETINE HCL 20 MG PO CAPS
20.0000 mg | ORAL_CAPSULE | Freq: Every day | ORAL | Status: DC
  Administered 2014-08-01 – 2014-08-04 (×4): 20 mg via ORAL
  Filled 2014-08-01 (×4): qty 1

## 2014-08-01 MED ORDER — OXYBUTYNIN CHLORIDE 5 MG PO TABS
5.0000 mg | ORAL_TABLET | Freq: Three times a day (TID) | ORAL | Status: DC | PRN
  Filled 2014-08-01: qty 1

## 2014-08-01 MED ORDER — ALUM & MAG HYDROXIDE-SIMETH 200-200-20 MG/5ML PO SUSP: 30.0000 mL | Freq: Four times a day (QID) | ORAL | Status: DC | PRN

## 2014-08-01 MED ORDER — DOCUSATE SODIUM 100 MG PO CAPS
100.0000 mg | ORAL_CAPSULE | Freq: Two times a day (BID) | ORAL | Status: DC
  Administered 2014-08-01 – 2014-08-04 (×6): 100 mg via ORAL
  Filled 2014-08-01 (×6): qty 1

## 2014-08-01 MED ORDER — DIAZEPAM 5 MG PO TABS: 5.0000 mg | ORAL_TABLET | Freq: Two times a day (BID) | ORAL | Status: DC | PRN

## 2014-08-01 MED ORDER — PIPERACILLIN-TAZOBACTAM 3.375 G IVPB
3.3750 g | Freq: Once | INTRAVENOUS | Status: AC
  Administered 2014-08-01: 3.375 g via INTRAVENOUS

## 2014-08-01 MED ORDER — PIPERACILLIN-TAZOBACTAM 3.375 G IVPB 30 MIN
3.3750 g | Freq: Four times a day (QID) | INTRAVENOUS | Status: DC
  Administered 2014-08-01 – 2014-08-03 (×8): 3.375 g via INTRAVENOUS
  Filled 2014-08-01 (×12): qty 50

## 2014-08-01 MED ORDER — SODIUM CHLORIDE 0.9 % IV BOLUS (SEPSIS)
1000.0000 mL | Freq: Once | INTRAVENOUS | Status: AC
  Administered 2014-08-01: 1000 mL via INTRAVENOUS

## 2014-08-01 MED ORDER — SODIUM CHLORIDE 0.9 % IV SOLN
INTRAVENOUS | Status: DC
  Administered 2014-08-01 – 2014-08-04 (×4): via INTRAVENOUS

## 2014-08-01 MED ORDER — ACETAMINOPHEN 325 MG PO TABS: 650.0000 mg | ORAL_TABLET | Freq: Four times a day (QID) | ORAL | Status: DC | PRN

## 2014-08-01 MED ORDER — METOPROLOL SUCCINATE ER 25 MG PO TB24
25.0000 mg | ORAL_TABLET | Freq: Every day | ORAL | Status: DC
  Administered 2014-08-01 – 2014-08-04 (×4): 25 mg via ORAL
  Filled 2014-08-01 (×4): qty 1

## 2014-08-01 MED ORDER — PIPERACILLIN-TAZOBACTAM 3.375 G IVPB
INTRAVENOUS | Status: AC
  Filled 2014-08-01: qty 50

## 2014-08-01 MED ORDER — AMLODIPINE BESYLATE 5 MG PO TABS
5.0000 mg | ORAL_TABLET | Freq: Every day | ORAL | Status: DC
  Administered 2014-08-01 – 2014-08-02 (×2): 5 mg via ORAL
  Filled 2014-08-01 (×2): qty 1

## 2014-08-01 NOTE — ED Notes (Signed)
Pt has indwelling foley from home.  Had not been changed in about 1 month.  Was changed today by home health

## 2014-08-01 NOTE — Telephone Encounter (Signed)
Agreed. Pt needs to be seen and is currently in ER.

## 2014-08-01 NOTE — ED Notes (Signed)
Dr Fanny Bien notified of Lacitc acid 3.0. zosyn ordered. Repeat lactic acid in 3 hours

## 2014-08-01 NOTE — Telephone Encounter (Signed)
Heather nurse with [facility] left v/m; pt having new symptom of confusion,fever 101.7, pt has urinary cath that was changed today but urine is clear with no odor or sediment. Pt has wound to sacrum that has not changed appearance.  Heather request cb. Noted from encounter tab pt has gone to [facility] ED.

## 2014-08-01 NOTE — H&P (Signed)
PATIENT NAME: Travis Cantrell    MR#:  [id number]  DATE OF BIRTH:  1960/11/14  DATE OF ADMISSION:  08/01/2014  PRIMARY CARE PHYSICIAN: Eliezer Lofts, MD   REQUESTING/REFERRING PHYSICIAN: Dr. Delman Kitten  CHIEF COMPLAINT: Fever, lethargy   Chief Complaint  Patient presents with  . Fever    HISTORY OF PRESENT ILLNESS:  Travis Cantrell  is a 54 y.o. male with a known history of multiple sclerosis, bedridden secondary to it  has chronic Foley comes in with fever, lethargy since last night. Found to have elevated white count up to 25 with urine infection. Patient has no nausea or vomiting. No diarrhea. Admitting for fever and lethargy secondary to UTI, catheter related UTI.  PAST MEDICAL HISTORY:   Past Medical History  Diagnosis Date  . Allergy   . GERD (gastroesophageal reflux disease)   . MS (multiple sclerosis)   . Hypertension     PAST SURGICAL HISTOIRY:   Past Surgical History  Procedure Laterality Date  . Vein reconsruction    . Rotator cuff repair      SOCIAL HISTORY:   History  Substance Use Topics  . Smoking status: Never Smoker   . Smokeless tobacco: Never Used  . Alcohol Use: No    FAMILY HISTORY:   Family History  Problem Relation Age of Onset  . Cancer Mother     multiple myeloma  . Diabetes Father   . Heart attack Father     DRUG ALLERGIES:   Allergies  Allergen Reactions  . Baclofen Diarrhea    REVIEW OF SYSTEMS:  CONSTITUTIONAL: Fever, lethargy since yesterday. EYES: No blurred or double vision.  EARS, NOSE, AND THROAT: No tinnitus or ear pain.  RESPIRATORY: No cough, shortness of breath, wheezing or hemoptysis.  CARDIOVASCULAR: No chest pain, orthopnea, edema.  GASTROINTESTINAL: No nausea, vomiting, diarrhea or abdominal pain.  GENITOURINARY: Cloudy urine observed  ENDOCRINE: No polyuria, nocturia,  HEMATOLOGY: No anemia, easy bruising or bleeding SKIN: No rash or lesion. MUSCULOSKELETAL: No joint pain or arthritis.   NEUROLOGIC: No  tingling, numbness, weakness.  PSYCHIATRY: No anxiety or depression.   MEDICATIONS AT HOME:   Prior to Admission medications   Medication Sig Start Date End Date Taking? Authorizing Provider  amLODipine (NORVASC) 5 MG tablet Take 1 tablet (5 mg total) by mouth daily. 06/18/14  Yes Nicholes Mango, MD  atorvastatin (LIPITOR) 40 MG tablet Take 1 tablet (40 mg total) by mouth daily. 07/02/14  Yes Amy Cletis Athens, MD  Cholecalciferol (VITAMIN D-3 PO) Take 1 tablet by mouth daily.    Yes Historical Provider, MD  diazepam (VALIUM) 5 MG tablet TAKE ONE TABLET BY MOUTH EVERY 12 HOURS AS NEEDED 06/19/14  Yes Amy Cletis Athens, MD  docusate sodium (COLACE) 100 MG capsule Take 1 capsule (100 mg total) by mouth 2 (two) times daily. 06/18/14  Yes Nicholes Mango, MD  FLUoxetine (PROZAC) 20 MG capsule TAKE TWO CAPSULES BY MOUTH IN THE MORNING AND TAKE ONE CAPSULE BY MOUTH IN THE EVENING 07/06/14  Yes Amy E Bedsole, MD  gabapentin (NEURONTIN) 300 MG capsule TAKE THREE CAPSULES BY MOUTH THREE TIMES DAILY 07/25/14  Yes Amy Cletis Athens, MD  ibuprofen (ADVIL,MOTRIN) 800 MG tablet TAKE ONE TABLET BY MOUTH EVERY 8 HOURS AS NEEDED Patient taking differently: TAKE ONE TABLET BY MOUTH EVERY 8 HOURS AS NEEDED FOR PAIN 02/07/14  Yes Amy Cletis Athens, MD  lisinopril (PRINIVIL,ZESTRIL) 10 MG tablet TAKE ONE TABLET BY MOUTH ONCE DAILY 06/18/14  Yes Amy E Bedsole,  MD  metoprolol succinate (TOPROL-XL) 25 MG 24 hr tablet Take 25 mg by mouth daily.  07/16/14  Yes Historical Provider, MD  omeprazole (PRILOSEC) 20 MG capsule TAKE ONE CAPSULE BY MOUTH TWICE DAILY 01/02/14  Yes Amy E Bedsole, MD  oxybutynin (DITROPAN) 5 MG tablet Take 1 tablet (5 mg total) by mouth every 8 (eight) hours as needed for bladder spasms. 06/18/14  Yes Nicholes Mango, MD  saccharomyces boulardii (FLORASTOR) 250 MG capsule Take 1 capsule (250 mg total) by mouth 2 (two) times daily. 06/18/14  Yes Nicholes Mango, MD      VITAL SIGNS:  Blood pressure 173/107, pulse 86, temperature 100.5 F  (38.1 C), temperature source Rectal, resp. rate 21, height '5\' 10"'  (1.778 m), weight 102.059 kg (225 lb), SpO2 96 %.  PHYSICAL EXAMINATION:  GENERAL:  54 y.o.-year-old patient lying in the bed with no acute distress.  EYES: Pupils equal, round, reactive to light and accommodation. No scleral icterus. Extraocular muscles intact.  HEENT: Head atraumatic, normocephalic. Oropharynx and nasopharynx clear.  NECK:  Supple, no jugular venous distention. No thyroid enlargement, no tenderness.  LUNGS: Normal breath sounds bilaterally, no wheezing, rales,rhonchi or crepitation. No use of accessory muscles of respiration.  CARDIOVASCULAR: S1, S2 normal. No murmurs, rubs, or gallops.  ABDOMEN: Soft, nontender, nondistended. Bowel sounds present. No organomegaly or mass.  EXTREMITIES: Has skin graft marks on both legs. Has trace pedal edema bilaterally. NEUROLOGIC: Cranial nerves II through XII are intact. Muscle strength 5/5 in all extremities. Sensation intact. Gait not checked.  PSYCHIATRIC: The patient is alert and oriented x 3.  SKIN: No obvious rash, lesion, or ulcer.   LABORATORY PANEL:   CBC  Recent Labs Lab 08/01/14 1354  WBC 25.5*  HGB 13.9  HCT 42.8  PLT 315   ------------------------------------------------------------------------------------------------------------------  Chemistries   Recent Labs Lab 08/01/14 1354  NA 135  K 3.8  CL 102  CO2 23  GLUCOSE 136*  BUN 23*  CREATININE 0.89  CALCIUM 9.2  AST 22  ALT 13*  ALKPHOS 86  BILITOT 1.4*   ------------------------------------------------------------------------------------------------------------------  Cardiac Enzymes No results for input(s): TROPONINI in the last 168 hours. ------------------------------------------------------------------------------------------------------------------  RADIOLOGY:  Dg Chest 1 View  08/01/2014   CLINICAL DATA:  One day history of fever and headache  EXAM: CHEST  1 VIEW   COMPARISON:  November 01, 2013  FINDINGS: Lungs are clear. The heart size and pulmonary vascularity are normal. No adenopathy. No bone lesions.  IMPRESSION: No edema or consolidation.   Electronically Signed   By: Lowella Grip III M.D.   On: 08/01/2014 13:54    EKG:   Orders placed or performed during the hospital encounter of 06/14/14  . EKG 12-Lead  . EKG 12-Lead  . EKG    IMPRESSION AND PLAN:   54 year old male patient with the fever and UTI secondary to chronic Foley. . Start him on Zosyn, Foley catheter to be changed today. Follow urine cultures. 2.History of multiple sclerosis ;bedridden.Marland Kitchen 3.Hypertension controlled continue home medications. 4.Hyperlipidemia continue statins. 5.D VT prophylaxis with Lovenox.  Discussed the plan with the wife.     All the records are reviewed and case discussed with ED provider. Management plans discussed with the patient, family and they are in agreement.  CODE STATUS: Full TOTAL TIME TAKING CARE OF THIS PATIENT: 55 minutes.    Epifanio Lesches M.D on 08/01/2014 at 3:40 PM  Between 7am to 6pm - Pager - [phone]  After 6pm go to [email or web address] - password [email or web address]  [facility]  Office  [phone]  CC: Primary care physician; Eliezer Lofts, MD

## 2014-08-01 NOTE — ED Notes (Addendum)
Presents with fever onset yesterday. Intermittent confusion with fever. Last night refused to take meds and today was thinking he was late for work after not working in many years. tmax at home 101.3

## 2014-08-01 NOTE — Progress Notes (Signed)
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 rn  Spoke to dr Luberta Mutter who reported obtain wound culture ,dressing wound per home use=iodoform guaze qd  And initiate WOC RN REFERRAL

## 2014-08-01 NOTE — Progress Notes (Signed)
Lactic acid 2.3. Called to dr Luberta Mutter . md reports increase ivf to 100/hr
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
## 2014-08-02 DIAGNOSIS — L8990 Pressure ulcer of unspecified site, unspecified stage: Secondary | ICD-10-CM | POA: Insufficient documentation

## 2014-08-02 LAB — BASIC METABOLIC PANEL
Anion gap: 6 (ref 5–15)
BUN: 20 mg/dL (ref 6–20)
CHLORIDE: 106 mmol/L (ref 101–111)
CO2: 27 mmol/L (ref 22–32)
Calcium: 8.5 mg/dL — ABNORMAL LOW (ref 8.9–10.3)
Creatinine, Ser: 1.09 mg/dL (ref 0.61–1.24)
GFR calc Af Amer: >60 mL/min (ref >60)
GLUCOSE: 109 mg/dL — AB (ref 65–99)
POTASSIUM: 3.8 mmol/L (ref 3.5–5.1)
Sodium: 139 mmol/L (ref 135–145)

## 2014-08-02 LAB — CBC
HCT: 38.6 % — ABNORMAL LOW (ref 40.0–52.0)
Hemoglobin: 12.7 g/dL — ABNORMAL LOW (ref 13.0–18.0)
MCH: 28.4 pg (ref 26.0–34.0)
MCHC: 32.8 g/dL (ref 32.0–36.0)
MCV: 86.5 fL (ref 80.0–100.0)
PLATELETS: 253 10*3/uL (ref 150–440)
RBC: 4.46 MIL/uL (ref 4.40–5.90)
RDW: 13.9 % (ref 11.5–14.5)
WBC: 17.4 10*3/uL — ABNORMAL HIGH (ref 3.8–10.6)

## 2014-08-02 MED ORDER — AMLODIPINE BESYLATE 10 MG PO TABS
10.0000 mg | ORAL_TABLET | Freq: Every day | ORAL | Status: DC
  Administered 2014-08-03 – 2014-08-04 (×2): 10 mg via ORAL
  Filled 2014-08-02 (×2): qty 1

## 2014-08-02 MED ORDER — LISINOPRIL 20 MG PO TABS
20.0000 mg | ORAL_TABLET | Freq: Every day | ORAL | Status: DC
  Administered 2014-08-03 – 2014-08-04 (×2): 20 mg via ORAL
  Filled 2014-08-02 (×2): qty 1

## 2014-08-02 MED ORDER — AMLODIPINE BESYLATE 5 MG PO TABS
5.0000 mg | ORAL_TABLET | Freq: Once | ORAL | Status: AC
  Administered 2014-08-02: 5 mg via ORAL
  Filled 2014-08-02: qty 1

## 2014-08-02 MED ORDER — ENSURE ENLIVE PO LIQD
237.0000 mL | ORAL | Status: DC
Start: 2014-08-03 — End: 2014-08-04

## 2014-08-02 MED ORDER — LISINOPRIL 10 MG PO TABS
10.0000 mg | ORAL_TABLET | Freq: Once | ORAL | Status: AC
  Administered 2014-08-02: 10 mg via ORAL
  Filled 2014-08-02: qty 1

## 2014-08-02 NOTE — Progress Notes (Signed)
Patient alert and oriented but mid day had period of lethargy and became more alert.  Denies any pain.  Elevated blood pressure throughout day and blood pressure medications increased due to elevated BP.  Wound care completed.  Held stool softener as patient has large bowel movement.

## 2014-08-02 NOTE — Outcomes Assessment (Signed)
VSS, patient is A+O with no signs of distress but forgetful at times.  Turned q 2h.  Denies pain. IV fluids infusing. Foley cath is patent.

## 2014-08-02 NOTE — Progress Notes (Signed)
[facility] [place] at [facility]   PATIENT NAME: Travis Cantrell    MR#:  [id number]  DATE OF BIRTH:  [birth year]  SUBJECTIVE:  CHIEF COMPLAINT:  Patient is resting comfortably. Fever resolved  REVIEW OF SYSTEMS:  CONSTITUTIONAL: No fever, fatigue or weakness.  EYES: No blurred or double vision.  EARS, NOSE, AND THROAT: No tinnitus or ear pain.  RESPIRATORY: No cough, shortness of breath, wheezing or hemoptysis.  CARDIOVASCULAR: No chest pain, orthopnea, edema.  GASTROINTESTINAL: No nausea, vomiting, diarrhea or abdominal pain.  GENITOURINARY: No dysuria, hematuria.  ENDOCRINE: No polyuria, nocturia,  HEMATOLOGY: No anemia, easy bruising or bleeding SKIN: No rash or lesion. MUSCULOSKELETAL: No joint pain or arthritis.   NEUROLOGIC: Patient is bedbound with history of multiple sclerosis No tingling, numbness, weakness.  PSYCHIATRY: No anxiety or depression.   DRUG ALLERGIES:   Allergies  Allergen Reactions  . Baclofen Diarrhea    VITALS:  Blood pressure 171/85, pulse 53, temperature 98.1 F (36.7 C), temperature source Oral, resp. rate 16, height  (1.778 m), weight 102.059 kg (225 lb), SpO2 100 %.  PHYSICAL EXAMINATION:  GENERAL:  54 y.o.-year-old patient lying in the bed with no acute distress.  EYES: Pupils equal, round, reactive to light and accommodation. No scleral icterus. Extraocular muscles intact.  HEENT: Head atraumatic, normocephalic. Oropharynx and nasopharynx clear.  NECK:  Supple, no jugular venous distention. No thyroid enlargement, no tenderness.  LUNGS: Normal breath sounds bilaterally, no wheezing, rales,rhonchi or crepitation. No use of accessory muscles of respiration.  CARDIOVASCULAR: S1, S2 normal. No murmurs, rubs, or gallops.  ABDOMEN: Soft, nontender, nondistended. Bowel sounds present. No organomegaly or mass.  EXTREMITIES: No pedal edema, cyanosis, or clubbing.  NEUROLOGIC: Has history of multiple sclerosis and  bedbound . Gait not checked.  PSYCHIATRIC: The patient is alert and oriented x 3.  SKIN: No obvious rash, lesion, or ulcer.    LABORATORY PANEL:   CBC  Recent Labs Lab 08/02/14 0453  WBC 17.4*  HGB 12.7*  HCT 38.6*  PLT 253   ------------------------------------------------------------------------------------------------------------------  Chemistries   Recent Labs Lab 08/01/14 1354 08/02/14 0453  NA 135 139  K 3.8 3.8  CL 102 106  CO2 23 27  GLUCOSE 136* 109*  BUN 23* 20  CREATININE 0.89 1.09  CALCIUM 9.2 8.5*  AST 22  --   ALT 13*  --   ALKPHOS 86  --   BILITOT 1.4*  --    ------------------------------------------------------------------------------------------------------------------  Cardiac Enzymes No results for input(s): TROPONINI in the last 168 hours. ------------------------------------------------------------------------------------------------------------------  RADIOLOGY:  Dg Chest 1 View  08/01/2014   CLINICAL DATA:  One day history of fever and headache  EXAM: CHEST  1 VIEW  COMPARISON:  November 01, 2013  FINDINGS: Lungs are clear. The heart size and pulmonary vascularity are normal. No adenopathy. No bone lesions.  IMPRESSION: No edema or consolidation.   Electronically Signed   By: Bretta Bang III M.D.   On: 08/01/2014 13:54    EKG:   Orders placed or performed during the hospital encounter of 06/14/14  . EKG 12-Lead  . EKG 12-Lead  . EKG    ASSESSMENT AND PLAN:   54 year old male patient with the fever and UTI secondary to chronic Foley.  1. Sepsis secondary to UTI , meets criteria with leukocytosis and lactic acidosis-  Zosyn, status post change of Foley catheter . Follow urine cultures. 2.History of multiple sclerosis ;bedridden.Marland Kitchen 3.Hypertension controlled continue home medications. 4.Hyperlipidemia continue statins. 5.D  VT prophylaxis with Lovenox.       All the records are reviewed and case discussed with Care  Management/Social Workerr. Management plans discussed with the patient, family and they are in agreement.  CODE STATUS: FULL   TOTAL TIME TAKING CARE OF THIS PATIENT: 35 minutes.   POSSIBLE D/C IN 1-2 DAYS, DEPENDING ON CLINICAL CONDITION.   Ramonita Lab M.D on 08/02/2014 at 2:45 PM  Between 7am to 6pm - Pager - [phone] After 6pm go to [email or web address] - password [email or web address] [facility]  [facility]  Office  [phone]  CC: Primary care physician; Kerby Nora, MD

## 2014-08-02 NOTE — Progress Notes (Signed)
Initial Nutrition Assessment  INTERVENTION:  Meals and Snacks: Cater to patient preferences Medical Food Supplement Therapy: will recommend Magic Cup BID and Ensure Enlive daily Ensure Enlive (each supplement provides 350kcal and 20 grams of protein)  NUTRITION DIAGNOSIS:  Increased nutrient needs related to wound healing as evidenced by estimated needs.  GOAL:  Patient will meet greater than or equal to 90% of their needs  MONITOR:   (Energy Intake, Skin, Anthropometrics)  REASON FOR ASSESSMENT:   (RD Screen, Pressure Ulcer)    ASSESSMENT:  Pt admitted with fever and lethargy secondary to UTI. Pt with stage III coccyx pressure ulcer.  PMHx:  Past Medical History  Diagnosis Date  . Allergy   . GERD (gastroesophageal reflux disease)   . MS (multiple sclerosis)   . Hypertension     Diet Order:  Diet Heart Room service appropriate?: Yes; Fluid consistency:: Thin  Current Nutrition: Pt ate 100% of big breakfast late this morning. Pt fell asleep and had not eaten lunch yet.  Food/Nutrition-Related History: Pt reports good appetite PTA eating 2-3 meals per day. Pt reports home RN had been encouraging protein intake PTA for wound healing. Pt likes Ensure.   Medications: NS at 178mL/hr, Protonix, colace  Electrolyte/Renal Profile and Glucose Profile:   Recent Labs Lab 08/01/14 1354 08/02/14 0453  NA 135 139  K 3.8 3.8  CL 102 106  CO2 23 27  BUN 23* 20  CREATININE 0.89 1.09  CALCIUM 9.2 8.5*  GLUCOSE 136* 109*   Protein Profile:  Recent Labs Lab 08/01/14 1354  ALBUMIN 3.7    Gastrointestinal Profile: Last BM: 7/9   Weight Change: Pt reports stable weight 200+ lbs. PTA Anthropometrics:   Height:  Ht Readings from Last 1 Encounters:  08/01/14  (1.778 m)    Weight:  Wt Readings from Last 1 Encounters:  08/01/14 225 lb (102.059 kg)     Wt Readings from Last 10 Encounters:  08/01/14 225 lb (102.059 kg)  06/18/14 174 lb 3.2 oz (79.017  kg)  06/11/13 250 lb (113.399 kg)  12/08/09 215 lb (97.523 kg)  09/25/09 215 lb (97.523 kg)  04/29/08 215 lb (97.523 kg)  08/29/07 215 lb (97.523 kg)    BMI:  Body mass index is 32.28 kg/(m^2).  Skin:   stage III coccyx pressure ulcer  EDUCATION NEEDS:  No education needs identified at this time   LOW Care Level  Leda Quail, RD, LDN Pager [phone]

## 2014-08-03 LAB — URINE CULTURE
Culture: 80000
SPECIAL REQUESTS: NORMAL

## 2014-08-03 LAB — CBC
HEMATOCRIT: 37.3 % — AB (ref 40.0–52.0)
Hemoglobin: 12.1 g/dL — ABNORMAL LOW (ref 13.0–18.0)
MCH: 28.1 pg (ref 26.0–34.0)
MCHC: 32.4 g/dL (ref 32.0–36.0)
MCV: 86.9 fL (ref 80.0–100.0)
Platelets: 260 10*3/uL (ref 150–440)
RBC: 4.3 MIL/uL — ABNORMAL LOW (ref 4.40–5.90)
RDW: 14.5 % (ref 11.5–14.5)
WBC: 11.3 10*3/uL — AB (ref 3.8–10.6)

## 2014-08-03 MED ORDER — ENSURE ENLIVE PO LIQD
237.0000 mL | Freq: Two times a day (BID) | ORAL | Status: DC
  Administered 2014-08-03 – 2014-08-04 (×3): 237 mL via ORAL

## 2014-08-03 MED ORDER — PIPERACILLIN-TAZOBACTAM 3.375 G IVPB
3.3750 g | Freq: Three times a day (TID) | INTRAVENOUS | Status: DC
  Administered 2014-08-03 – 2014-08-04 (×2): 3.375 g via INTRAVENOUS
  Filled 2014-08-03 (×6): qty 50

## 2014-08-03 NOTE — Progress Notes (Signed)
ANTIBIOTIC CONSULT NOTE - INITIAL  Pharmacy Consult for Zosyn Indication: rule out sepsis/UTI  Allergies  Allergen Reactions  . Baclofen Diarrhea    Patient Measurements: Height:  (177.8 cm) Weight: 225 lb (102.059 kg) IBW/kg (Calculated) : 73   Vital Signs: Temp: 98.8 F (37.1 C) (07/10 0729) Temp Source: Oral (07/10 0729) BP: 151/88 mmHg (07/10 1015) Pulse Rate: 84 (07/10 1015) Intake/Output from previous day: 07/09 0701 - 07/10 0700 In: 1446.7 [P.O.:240; I.V.:1206.7] Out: 2725 [Urine:2725] Intake/Output from this shift: Total I/O In: 2063.3 [P.O.:240; I.V.:1823.3] Out: 1050 [Urine:1050]  Labs:  Recent Labs  08/01/14 1354 08/02/14 0453 08/03/14 0359  WBC 25.5* 17.4* 11.3*  HGB 13.9 12.7* 12.1*  PLT 315 253 260  CREATININE 0.89 1.09  --    Estimated Creatinine Clearance: 92.7 mL/min (by C-G formula based on Cr of 1.09). No results for input(s): VANCOTROUGH, VANCOPEAK, VANCORANDOM, GENTTROUGH, GENTPEAK, GENTRANDOM, TOBRATROUGH, TOBRAPEAK, TOBRARND, AMIKACINPEAK, AMIKACINTROU, AMIKACIN in the last 72 hours.   Microbiology: Recent Results (from the past 720 hour(s))  Urine culture     Status: None   Collection Time: 08/01/14  1:47 PM  Result Value Ref Range Status   Specimen Description URINE, RANDOM  Final   Special Requests Normal  Final   Culture   Final    80,000 COLONIES/ml SERRATIA MARCESCENS 50,000 COLONIES/mL PSEUDOMONAS AERUGINOSA    Report Status 08/03/2014 FINAL  Final   Organism ID, Bacteria SERRATIA MARCESCENS  Final   Organism ID, Bacteria PSEUDOMONAS AERUGINOSA  Final      Susceptibility   Pseudomonas aeruginosa - MIC*    CEFTAZIDIME 4 SENSITIVE Sensitive     CIPROFLOXACIN <=0.25 SENSITIVE Sensitive     GENTAMICIN <=1 SENSITIVE Sensitive     IMIPENEM 2 SENSITIVE Sensitive     * 50,000 COLONIES/mL PSEUDOMONAS AERUGINOSA   Serratia marcescens - MIC*    CEFTAZIDIME <=1 SENSITIVE Sensitive     CEFAZOLIN >=64 RESISTANT Resistant      CEFTRIAXONE <=1 SENSITIVE Sensitive     CIPROFLOXACIN <=0.25 SENSITIVE Sensitive     GENTAMICIN <=1 SENSITIVE Sensitive     TRIMETH/SULFA <=20 SENSITIVE Sensitive     * 80,000 COLONIES/ml SERRATIA MARCESCENS  Culture, blood (routine x 2)     Status: None (Preliminary result)   Collection Time: 08/01/14  1:47 PM  Result Value Ref Range Status   Specimen Description BLOOD LEFT HAND  Final   Special Requests BOTTLES DRAWN AEROBIC AND ANAEROBIC 11CC  Final   Culture NO GROWTH 2 DAYS  Final   Report Status PENDING  Incomplete  Culture, blood (routine x 2)     Status: None (Preliminary result)   Collection Time: 08/01/14  1:53 PM  Result Value Ref Range Status   Specimen Description BLOOD RESISTANT HAND  Final   Special Requests BOTTLES DRAWN AEROBIC AND ANAEROBIC 11CC  Final   Culture NO GROWTH 2 DAYS  Final   Report Status PENDING  Incomplete  Wound culture     Status: None (Preliminary result)   Collection Time: 08/02/14  9:25 AM  Result Value Ref Range Status   Specimen Description WOUND  Final   Special Requests Normal  Final   Gram Stain FEW WBC SEEN FEW GRAM NEGATIVE RODS   Final   Culture HOLDING FOR POSSIBLE PATHOGEN  Final   Report Status PENDING  Incomplete    Medical History: Past Medical History  Diagnosis Date  . Allergy   . GERD (gastroesophageal reflux disease)   . MS (multiple sclerosis)   .  Hypertension     Medications:  Scheduled:  . amLODipine  10 mg Oral Daily  . atorvastatin  40 mg Oral Daily  . docusate sodium  100 mg Oral BID  . enoxaparin (LOVENOX) injection  40 mg Subcutaneous Q24H  . feeding supplement (ENSURE ENLIVE)  237 mL Oral Q24H  . feeding supplement (ENSURE ENLIVE)  237 mL Oral BID WC  . FLUoxetine  20 mg Oral Daily  . gabapentin  900 mg Oral TID  . lisinopril  20 mg Oral Daily  . metoprolol succinate  25 mg Oral Daily  . pantoprazole  40 mg Oral Daily  . piperacillin-tazobactam (ZOSYN)  IV  3.375 g Intravenous 3 times per day    Infusions:  . sodium chloride 100 mL/hr at 08/03/14 1121   Assessment: Patient is a 54 yo male admitted for treatment of UTI/sepsis.  Patient is currently ordered Zosyn 3.375 gm IV q6h (30 min infusion).   SCr: 1.09, est CrCl~92 mL/min  Urine Cx growing Serratia and Pseudomonas Wound Cx: pending/holding for pathogen  Goal of Therapy:  Resolution of infection  Plan:  Will transition patient to Zosyn 3.375 gm IV q8h per EI protocol as urine cultures are growing Pseudomonas.  Extended infusion regimen have shown clinical benefits in patients with gram negative infection.  Pharmacy will continue to follow.  Lawrance Wiedemann G 08/03/2014,2:25 PM

## 2014-08-03 NOTE — Consult Note (Signed)
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  Previously healed full thickness ulcerations on bilateral heels.  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  Patient is found to be wear and adult brief that is wet. Wound type:Pressure, moisture Pressure Ulcer POA: Yes Measurement:Coccyx:  0.8cm x 1.5cm x 3.5cm Wound bed: red, moist with closed wound edges (epibole) Drainage (amount, consistency, odor) serosanguinous on old ribbon packing removed Periwound:intact with perwound erythema, blanchable Dressing procedure/placement/frequency: I will continue the filling of the defect, but change medium to an antimicrobial absorbant dressing (Aquacel Ag+) performed once daily.  Heel pressure injuries (full thickness, I do not know if these were a Stage 3 or 4) sustained last year at another hospital have healed, but continue to be at risk, so Nursing Orders are provided to floating of the heels.  It is noted that patient has bilateral foot drop.  I have instructed nursing staff to forego adult briefs while in our care; we have DermaTherapy linen and that in conjunction with our house skin care products and timely incontinence care is preferred over the use of adult briefs. WOC nursing team will not follow, but will remain available to this patient, the nursing and medical teams.  Please re-consult if needed. Thanks, Ladona Mow, MSN, RN, GNP, [place], CWON-AP [phone])

## 2014-08-03 NOTE — Progress Notes (Signed)
Patient cooperative with care.  Reports no complaints.  Wound care nurse rounded and added instructions for Stage 4 Pressure Ulcer on coccyx and complete skin care instructions.  Dressing changed this shift.  IV ABX continued.

## 2014-08-03 NOTE — Progress Notes (Signed)
[facility] - [place] at [facility]   PATIENT NAME: Travis Cantrell    MR#:  [id number]  DATE OF BIRTH:  Mar 21, 1960  SUBJECTIVE:  CHIEF COMPLAINT:  Patient is resting comfortably. Fever resolved  REVIEW OF SYSTEMS:  CONSTITUTIONAL: No fever, fatigue or weakness.  EYES: No blurred or double vision.  EARS, NOSE, AND THROAT: No tinnitus or ear pain.  RESPIRATORY: No cough, shortness of breath, wheezing or hemoptysis.  CARDIOVASCULAR: No chest pain, orthopnea, edema.  GASTROINTESTINAL: No nausea, vomiting, diarrhea or abdominal pain.  GENITOURINARY: No dysuria, hematuria.  ENDOCRINE: No polyuria, nocturia,  HEMATOLOGY: No anemia, easy bruising or bleeding SKIN: No rash or lesion. MUSCULOSKELETAL: No joint pain or arthritis.   NEUROLOGIC: Patient is bedbound with history of multiple sclerosis No tingling, numbness, weakness.  PSYCHIATRY: No anxiety or depression.   DRUG ALLERGIES:   Allergies  Allergen Reactions  . Baclofen Diarrhea    VITALS:  Blood pressure 152/82, pulse 61, temperature 98 F (36.7 C), temperature source Oral, resp. rate 18, height  (1.778 m), weight 102.059 kg (225 lb), SpO2 100 %.  PHYSICAL EXAMINATION:  GENERAL:  54 y.o.-year-old patient lying in the bed with no acute distress.  EYES: Pupils equal, round, reactive to light and accommodation. No scleral icterus. Extraocular muscles intact.  HEENT: Head atraumatic, normocephalic. Oropharynx and nasopharynx clear.  NECK:  Supple, no jugular venous distention. No thyroid enlargement, no tenderness.  LUNGS: Normal breath sounds bilaterally, no wheezing, rales,rhonchi or crepitation. No use of accessory muscles of respiration.  CARDIOVASCULAR: S1, S2 normal. No murmurs, rubs, or gallops.  ABDOMEN: Soft, nontender, nondistended. Bowel sounds present. No organomegaly or mass.  EXTREMITIES: No pedal edema, cyanosis, or clubbing.  NEUROLOGIC: Has history of multiple sclerosis and bedbound  . Gait not checked.  PSYCHIATRIC: The patient is alert and oriented x 3.  SKIN: No obvious rash, lesion, or ulcer.    LABORATORY PANEL:   CBC  Recent Labs Lab 08/03/14 0359  WBC 11.3*  HGB 12.1*  HCT 37.3*  PLT 260   ------------------------------------------------------------------------------------------------------------------  Chemistries   Recent Labs Lab 08/01/14 1354 08/02/14 0453  NA 135 139  K 3.8 3.8  CL 102 106  CO2 23 27  GLUCOSE 136* 109*  BUN 23* 20  CREATININE 0.89 1.09  CALCIUM 9.2 8.5*  AST 22  --   ALT 13*  --   ALKPHOS 86  --   BILITOT 1.4*  --    ------------------------------------------------------------------------------------------------------------------  Cardiac Enzymes No results for input(s): TROPONINI in the last 168 hours. ------------------------------------------------------------------------------------------------------------------  RADIOLOGY:  No results found.  EKG:   Orders placed or performed during the hospital encounter of 06/14/14  . EKG 12-Lead  . EKG 12-Lead  . EKG    ASSESSMENT AND PLAN:   54 year old male patient with the fever and UTI secondary to chronic Foley.  1. Sepsis secondary to UTI , meets criteria with leukocytosis and lactic acidosis-  Zosyn, status post change of Foley catheter . Follow urine cultures. 2.History of multiple sclerosis ;bedridden.Marland Kitchen 3.Hypertension controlled continue home medications. 4.Hyperlipidemia continue statins. 5.D VT prophylaxis with Lovenox.       All the records are reviewed and case discussed with Care Management/Social Workerr. Management plans discussed with the patient, family and they are in agreement.  CODE STATUS: FULL   TOTAL TIME TAKING CARE OF THIS PATIENT: 35 minutes.   POSSIBLE D/C IN 1-2 DAYS, DEPENDING ON CLINICAL CONDITION.   Ramonita Lab M.D on 08/03/2014 at 5:13 PM  Between 7am to 6pm - Pager - [phone] After 6pm go to [email or web address] -  password [email or web address] [facility]  [place] [facility]  Office  [phone]  CC: Primary care physician; Kerby Nora, MD

## 2014-08-04 MED ORDER — ACETAMINOPHEN 325 MG PO TABS: 650.0000 mg | ORAL_TABLET | Freq: Four times a day (QID) | ORAL | Status: DC | PRN

## 2014-08-04 MED ORDER — ENSURE ENLIVE PO LIQD: 237.0000 mL | Freq: Two times a day (BID) | ORAL | Status: DC

## 2014-08-04 MED ORDER — SULFAMETHOXAZOLE-TRIMETHOPRIM 800-160 MG PO TABS: 1.0000 | ORAL_TABLET | Freq: Two times a day (BID) | ORAL | Status: DC

## 2014-08-04 MED ORDER — CIPROFLOXACIN HCL 500 MG PO TABS: 500.0000 mg | ORAL_TABLET | Freq: Two times a day (BID) | ORAL | Status: DC

## 2014-08-04 NOTE — Discharge Instructions (Signed)
Follow-up with primary care physician in a week and PCP to follow up on final wound culture result, Home health Activity per home physical therapy Diet low salt

## 2014-08-04 NOTE — Discharge Summary (Signed)
[facility] - [place] at [facility]   PATIENT NAME: Travis Cantrell    MR#:  [id number]  DATE OF BIRTH:  1960-07-28  DATE OF ADMISSION:  08/01/2014 ADMITTING PHYSICIAN: Katha Hamming, MD 08/04/2014 DATE OF DISCHARGE: 08/04/2014  PRIMARY CARE PHYSICIAN: Kerby Nora, MD    ADMISSION DIAGNOSIS:  Urinary tract infection associated with catheterization of urinary tract, initial encounter [T83.51XA, N39.0] Sepsis, due to unspecified organism [A41.9]  DISCHARGE DIAGNOSIS:  Active Problems:   UTI (urinary tract infection)   Pressure ulcer   SECONDARY DIAGNOSIS:   Past Medical History  Diagnosis Date  . Allergy   . GERD (gastroesophageal reflux disease)   . MS (multiple sclerosis)   . Hypertension     HOSPITAL COURSE:  54 year old male patient with the fever and UTI secondary to chronic Foley.  1. Sepsis secondary to UTI , meets criteria with leukocytosis and lactic acidosis- Zosyn, status post change of Foley catheter during this admission . Follow up urine cultures revealed Pseudomonas and Serratia both sensitive to ciprofloxacin will discharge home with Cipro Wound culture with staph aureus , PCP to follow up on the final result. Will discharge him home with by mouth Bactrim DS . 2.History of multiple sclerosis ;bedridden..Home PT  3.Hypertension controlled continue home medications. 4.Hyperlipidemia continue statins. 5.D VT prophylaxis with Lovenox.     DISCHARGE CONDITIONS:  Satisfactory  CONSULTS OBTAINED:      PROCEDURES none  DRUG ALLERGIES:   Allergies  Allergen Reactions  . Baclofen Diarrhea    DISCHARGE MEDICATIONS:   Current Discharge Medication List    START taking these medications   Details  acetaminophen (TYLENOL) 325 MG tablet Take 2 tablets (650 mg total) by mouth every 6 (six) hours as needed for mild pain (or Fever >/= 101).    ciprofloxacin (CIPRO) 500 MG tablet Take 1 tablet (500 mg total) by mouth 2 (two)  times daily. Qty: 20 tablet, Refills: 0    feeding supplement, ENSURE ENLIVE, (ENSURE ENLIVE) LIQD Take 237 mLs by mouth 2 (two) times daily with a meal. Qty: 237 mL, Refills: 12    sulfamethoxazole-trimethoprim (BACTRIM DS,SEPTRA DS) 800-160 MG per tablet Take 1 tablet by mouth 2 (two) times daily. Qty: 20 tablet, Refills: 0      CONTINUE these medications which have NOT CHANGED   Details  amLODipine (NORVASC) 5 MG tablet Take 1 tablet (5 mg total) by mouth daily. Qty: 30 tablet, Refills: 0    atorvastatin (LIPITOR) 40 MG tablet Take 1 tablet (40 mg total) by mouth daily. Qty: 90 tablet, Refills: 1    Cholecalciferol (VITAMIN D-3 PO) Take 1 tablet by mouth daily.     diazepam (VALIUM) 5 MG tablet TAKE ONE TABLET BY MOUTH EVERY 12 HOURS AS NEEDED Qty: 30 tablet, Refills: 0    docusate sodium (COLACE) 100 MG capsule Take 1 capsule (100 mg total) by mouth 2 (two) times daily. Qty: 10 capsule, Refills: 0    FLUoxetine (PROZAC) 20 MG capsule TAKE TWO CAPSULES BY MOUTH IN THE MORNING AND TAKE ONE CAPSULE BY MOUTH IN THE EVENING Qty: 90 capsule, Refills: 2    gabapentin (NEURONTIN) 300 MG capsule TAKE THREE CAPSULES BY MOUTH THREE TIMES DAILY Qty: 360 capsule, Refills: 0    ibuprofen (ADVIL,MOTRIN) 800 MG tablet TAKE ONE TABLET BY MOUTH EVERY 8 HOURS AS NEEDED Qty: 90 tablet, Refills: 0    lisinopril (PRINIVIL,ZESTRIL) 10 MG tablet TAKE ONE TABLET BY MOUTH ONCE DAILY Qty: 30 tablet, Refills: 0  metoprolol succinate (TOPROL-XL) 25 MG 24 hr tablet Take 25 mg by mouth daily.     omeprazole (PRILOSEC) 20 MG capsule TAKE ONE CAPSULE BY MOUTH TWICE DAILY Qty: 60 capsule, Refills: 5    oxybutynin (DITROPAN) 5 MG tablet Take 1 tablet (5 mg total) by mouth every 8 (eight) hours as needed for bladder spasms. Qty: 90 tablet, Refills: 0    saccharomyces boulardii (FLORASTOR) 250 MG capsule Take 1 capsule (250 mg total) by mouth 2 (two) times daily. Qty: 20 capsule, Refills: 0          DISCHARGE INSTRUCTIONS:  Follow-up with primary care physician in a week. PCP to follow up on the final wound culture result which is pending at this time Home health with home PT   DIET:  Low-salt  ACTIVITY:  {Bedbound, as per  home PT   OXYGEN:  Home Oxygen: No.   Oxygen Delivery: room air  DISCHARGE LOCATION:  home   If you experience worsening of your admission symptoms, develop shortness of breath, life threatening emergency, suicidal or homicidal thoughts you must seek medical attention immediately by calling 911 or calling your MD immediately  if symptoms less severe.  You Must read complete instructions/literature along with all the possible adverse reactions/side effects for all the Medicines you take and that have been prescribed to you. Take any new Medicines after you have completely understood and accpet all the possible adverse reactions/side effects.   Please note  You were cared for by a hospitalist during your hospital stay. If you have any questions about your discharge medications or the care you received while you were in the hospital after you are discharged, you can call the unit and asked to speak with the hospitalist on call if the hospitalist that took care of you is not available. Once you are discharged, your primary care physician will handle any further medical issues. Please note that NO REFILLS for any discharge medications will be authorized once you are discharged, as it is imperative that you return to your primary care physician (or establish a relationship with a primary care physician if you do not have one) for your aftercare needs so that they can reassess your need for medications and monitor your lab values.     Today  Chief Complaint  Patient presents with  . Fever     resting comfortably. No complaints  ROS: none  CONSTITUTIONAL: Denies fevers, chills. Denies any fatigue, weakness.  EYES: Denies blurry vision, double vision,  eye pain. EARS, NOSE, THROAT: Denies tinnitus, ear pain, hearing loss. RESPIRATORY: Denies cough, wheeze, shortness of breath.  CARDIOVASCULAR: Denies chest pain, palpitations, edema.  GASTROINTESTINAL: Denies nausea, vomiting, diarrhea, abdominal pain. Denies bright red blood per rectum. GENITOURINARY: Denies dysuria, hematuria. ENDOCRINE: Denies nocturia or thyroid problems. HEMATOLOGIC AND LYMPHATIC: Denies easy bruising or bleeding. SKIN: Denies rash or lesion. MUSCULOSKELETAL: Denies pain in neck, back, shoulder, knees, hips or arthritic symptoms.  NEUROLOGIC:  has chronic history of multiple sclerosis  PSYCHIATRIC: Denies anxiety or depressive symptoms.   VITAL SIGNS:  Blood pressure 143/86, pulse 54, temperature 97.6 F (36.4 C), temperature source Oral, resp. rate 18, height 5\' 10"  (1.778 m), weight 102.059 kg (225 lb), SpO2 100 %.  I/O:   Intake/Output Summary (Last 24 hours) at 08/04/14 1314 Last data filed at 08/04/14 0928  Gross per 24 hour  Intake   1240 ml  Output   2325 ml  Net  -1085 ml    PHYSICAL EXAMINATION:  GENERAL:  54 y.o.-year-old patient lying in the bed with no acute distress.  EYES: Pupils equal, round, reactive to light and accommodation. No scleral icterus. Extraocular muscles intact.  HEENT: Head atraumatic, normocephalic. Oropharynx and nasopharynx clear.  NECK:  Supple, no jugular venous distention. No thyroid enlargement, no tenderness.  LUNGS: Normal breath sounds bilaterally, no wheezing, rales,rhonchi or crepitation. No use of accessory muscles of respiration.  CARDIOVASCULAR: S1, S2 normal. No murmurs, rubs, or gallops.  ABDOMEN: Soft, non-tender, non-distended. Bowel sounds present. No organomegaly or mass.  EXTREMITIES: No pedal edema, cyanosis, or clubbing.  NEUROLOGIC: Has multiple sclerosis, bedbound  PSYCHIATRIC: The patient is alert and oriented x 3.  SKIN: No obvious rash, lesion, or ulcer.   DATA REVIEW:   CBC  Recent  Labs Lab 08/03/14 0359  WBC 11.3*  HGB 12.1*  HCT 37.3*  PLT 260    Chemistries   Recent Labs Lab 08/01/14 1354 08/02/14 0453  NA 135 139  K 3.8 3.8  CL 102 106  CO2 23 27  GLUCOSE 136* 109*  BUN 23* 20  CREATININE 0.89 1.09  CALCIUM 9.2 8.5*  AST 22  --   ALT 13*  --   ALKPHOS 86  --   BILITOT 1.4*  --     Cardiac Enzymes No results for input(s): TROPONINI in the last 168 hours.  Microbiology Results  Results for orders placed or performed during the hospital encounter of 08/01/14  Urine culture     Status: None   Collection Time: 08/01/14  1:47 PM  Result Value Ref Range Status   Specimen Description URINE, RANDOM  Final   Special Requests Normal  Final   Culture   Final    80,000 COLONIES/ml SERRATIA MARCESCENS 50,000 COLONIES/mL PSEUDOMONAS AERUGINOSA    Report Status 08/03/2014 FINAL  Final   Organism ID, Bacteria SERRATIA MARCESCENS  Final   Organism ID, Bacteria PSEUDOMONAS AERUGINOSA  Final      Susceptibility   Pseudomonas aeruginosa - MIC*    CEFTAZIDIME 4 SENSITIVE Sensitive     CIPROFLOXACIN <=0.25 SENSITIVE Sensitive     GENTAMICIN <=1 SENSITIVE Sensitive     IMIPENEM 2 SENSITIVE Sensitive     * 50,000 COLONIES/mL PSEUDOMONAS AERUGINOSA   Serratia marcescens - MIC*    CEFTAZIDIME <=1 SENSITIVE Sensitive     CEFAZOLIN >=64 RESISTANT Resistant     CEFTRIAXONE <=1 SENSITIVE Sensitive     CIPROFLOXACIN <=0.25 SENSITIVE Sensitive     GENTAMICIN <=1 SENSITIVE Sensitive     TRIMETH/SULFA <=20 SENSITIVE Sensitive     * 80,000 COLONIES/ml SERRATIA MARCESCENS  Culture, blood (routine x 2)     Status: None (Preliminary result)   Collection Time: 08/01/14  1:47 PM  Result Value Ref Range Status   Specimen Description BLOOD LEFT HAND  Final   Special Requests BOTTLES DRAWN AEROBIC AND ANAEROBIC 11CC  Final   Culture NO GROWTH 3 DAYS  Final   Report Status PENDING  Incomplete  Culture, blood (routine x 2)     Status: None (Preliminary result)    Collection Time: 08/01/14  1:53 PM  Result Value Ref Range Status   Specimen Description BLOOD RESISTANT HAND  Final   Special Requests BOTTLES DRAWN AEROBIC AND ANAEROBIC 11CC  Final   Culture NO GROWTH 3 DAYS  Final   Report Status PENDING  Incomplete  Wound culture     Status: None (Preliminary result)   Collection Time: 08/02/14  9:25 AM  Result Value Ref Range  Status   Specimen Description WOUND  Final   Special Requests Normal  Final   Gram Stain FEW WBC SEEN FEW GRAM NEGATIVE RODS   Final   Culture   Final    RARE GROWTH STAPHYLOCOCCUS AUREUS SUSCEPTIBILITIES TO FOLLOW    Report Status PENDING  Incomplete    RADIOLOGY:  Dg Chest 1 View  08/01/2014   CLINICAL DATA:  One day history of fever and headache  EXAM: CHEST  1 VIEW  COMPARISON:  November 01, 2013  FINDINGS: Lungs are clear. The heart size and pulmonary vascularity are normal. No adenopathy. No bone lesions.  IMPRESSION: No edema or consolidation.   Electronically Signed   By: Bretta Bang III M.D.   On: 08/01/2014 13:54    EKG:   Orders placed or performed during the hospital encounter of 06/14/14  . EKG 12-Lead  . EKG 12-Lead  . EKG      Management plans discussed with the patient, family and they are in agreement.  CODE STATUS:     Code Status Orders        Start     Ordered   08/01/14 1516  Full code   Continuous     08/01/14 1518      TOTAL TIME TAKING CARE OF THIS PATIENT: 45  minutes.    @[facility] @  on 08/04/2014 at 1:14 PM  Between 7am to 6pm - Pager - [phone]  After 6pm go to [email or web address] - password [email or web address] [facility]  [place] [facility]  Office  [phone]  CC: Primary care physician; Kerby Nora, MD

## 2014-08-04 NOTE — Progress Notes (Signed)
Pt turned and repositioned for comfort and off wound to sacrum. Dressing intact. Makes needs and concerns known. Pain free.

## 2014-08-04 NOTE — Care Management (Addendum)
Spoke with [facility] Matilde Bash [phone] (fax [phone]) and they are open to this patient. I mentioned to [facility] the concern staff had with patient's skin integrity and hygiene and she will follow up with their nursing assistant caring for patient in the home. I also left a message for Carollee Herter expressing patient concern that home health was usually "only in the patient's home around 5-10 minutes" per patient. I asked if Medicare guidelines still required assessment of 1.5-2  hours per visit on that same message to [facility]. I also requested follow up from [facility] regarding wound management for this patient. Patient is able to use a cell phone for assistance. His ex-wife provides some assistance. RNCM will continue to follow.   Orders faxed to [facility].

## 2014-08-04 NOTE — Care Management (Signed)
Important Message  Patient Details  Name: Travis Cantrell MRN: [id number] Date of Birth: [birth year]   Medicare Important Message Given:  Yes-second notification given    Olegario Messier A Allmond 08/04/2014, 11:42 AM

## 2014-08-05 LAB — WOUND CULTURE: SPECIAL REQUESTS: NORMAL

## 2014-08-06 DIAGNOSIS — G35 Multiple sclerosis: Secondary | ICD-10-CM | POA: Diagnosis not present

## 2014-08-06 DIAGNOSIS — M62462 Contracture of muscle, left lower leg: Secondary | ICD-10-CM | POA: Diagnosis not present

## 2014-08-06 DIAGNOSIS — Z993 Dependence on wheelchair: Secondary | ICD-10-CM | POA: Diagnosis not present

## 2014-08-06 DIAGNOSIS — I10 Essential (primary) hypertension: Secondary | ICD-10-CM | POA: Diagnosis not present

## 2014-08-06 DIAGNOSIS — K219 Gastro-esophageal reflux disease without esophagitis: Secondary | ICD-10-CM | POA: Diagnosis not present

## 2014-08-06 DIAGNOSIS — E780 Pure hypercholesterolemia: Secondary | ICD-10-CM | POA: Diagnosis not present

## 2014-08-06 DIAGNOSIS — S3091XD Unspecified superficial injury of lower back and pelvis, subsequent encounter: Secondary | ICD-10-CM | POA: Diagnosis not present

## 2014-08-06 DIAGNOSIS — Z9181 History of falling: Secondary | ICD-10-CM | POA: Diagnosis not present

## 2014-08-06 DIAGNOSIS — N390 Urinary tract infection, site not specified: Secondary | ICD-10-CM | POA: Diagnosis not present

## 2014-08-06 DIAGNOSIS — M62461 Contracture of muscle, right lower leg: Secondary | ICD-10-CM | POA: Diagnosis not present

## 2014-08-06 DIAGNOSIS — L89314 Pressure ulcer of right buttock, stage 4: Secondary | ICD-10-CM | POA: Diagnosis not present

## 2014-08-06 DIAGNOSIS — M62421 Contracture of muscle, right upper arm: Secondary | ICD-10-CM | POA: Diagnosis not present

## 2014-08-06 DIAGNOSIS — Z466 Encounter for fitting and adjustment of urinary device: Secondary | ICD-10-CM | POA: Diagnosis not present

## 2014-08-06 DIAGNOSIS — F329 Major depressive disorder, single episode, unspecified: Secondary | ICD-10-CM | POA: Diagnosis not present

## 2014-08-06 LAB — CULTURE, BLOOD (ROUTINE X 2)
Culture: NO GROWTH
Culture: NO GROWTH

## 2014-08-07 ENCOUNTER — Ambulatory Visit: Payer: Medicare Other | Admitting: Family Medicine

## 2014-08-07 DIAGNOSIS — G35 Multiple sclerosis: Secondary | ICD-10-CM | POA: Diagnosis not present

## 2014-08-07 DIAGNOSIS — M62461 Contracture of muscle, right lower leg: Secondary | ICD-10-CM | POA: Diagnosis not present

## 2014-08-07 DIAGNOSIS — Z9181 History of falling: Secondary | ICD-10-CM | POA: Diagnosis not present

## 2014-08-07 DIAGNOSIS — F329 Major depressive disorder, single episode, unspecified: Secondary | ICD-10-CM | POA: Diagnosis not present

## 2014-08-07 DIAGNOSIS — K219 Gastro-esophageal reflux disease without esophagitis: Secondary | ICD-10-CM | POA: Diagnosis not present

## 2014-08-07 DIAGNOSIS — M62462 Contracture of muscle, left lower leg: Secondary | ICD-10-CM | POA: Diagnosis not present

## 2014-08-07 DIAGNOSIS — L89314 Pressure ulcer of right buttock, stage 4: Secondary | ICD-10-CM | POA: Diagnosis not present

## 2014-08-07 DIAGNOSIS — E780 Pure hypercholesterolemia: Secondary | ICD-10-CM | POA: Diagnosis not present

## 2014-08-07 DIAGNOSIS — Z466 Encounter for fitting and adjustment of urinary device: Secondary | ICD-10-CM | POA: Diagnosis not present

## 2014-08-07 DIAGNOSIS — M62421 Contracture of muscle, right upper arm: Secondary | ICD-10-CM | POA: Diagnosis not present

## 2014-08-07 DIAGNOSIS — Z993 Dependence on wheelchair: Secondary | ICD-10-CM | POA: Diagnosis not present

## 2014-08-07 DIAGNOSIS — S3091XD Unspecified superficial injury of lower back and pelvis, subsequent encounter: Secondary | ICD-10-CM | POA: Diagnosis not present

## 2014-08-07 DIAGNOSIS — N390 Urinary tract infection, site not specified: Secondary | ICD-10-CM | POA: Diagnosis not present

## 2014-08-07 DIAGNOSIS — I10 Essential (primary) hypertension: Secondary | ICD-10-CM | POA: Diagnosis not present

## 2014-08-14 DIAGNOSIS — Z466 Encounter for fitting and adjustment of urinary device: Secondary | ICD-10-CM | POA: Diagnosis not present

## 2014-08-14 DIAGNOSIS — K219 Gastro-esophageal reflux disease without esophagitis: Secondary | ICD-10-CM | POA: Diagnosis not present

## 2014-08-14 DIAGNOSIS — M62421 Contracture of muscle, right upper arm: Secondary | ICD-10-CM | POA: Diagnosis not present

## 2014-08-14 DIAGNOSIS — M62461 Contracture of muscle, right lower leg: Secondary | ICD-10-CM | POA: Diagnosis not present

## 2014-08-14 DIAGNOSIS — Z9181 History of falling: Secondary | ICD-10-CM | POA: Diagnosis not present

## 2014-08-14 DIAGNOSIS — Z993 Dependence on wheelchair: Secondary | ICD-10-CM | POA: Diagnosis not present

## 2014-08-14 DIAGNOSIS — G35 Multiple sclerosis: Secondary | ICD-10-CM | POA: Diagnosis not present

## 2014-08-14 DIAGNOSIS — M62462 Contracture of muscle, left lower leg: Secondary | ICD-10-CM | POA: Diagnosis not present

## 2014-08-14 DIAGNOSIS — N390 Urinary tract infection, site not specified: Secondary | ICD-10-CM | POA: Diagnosis not present

## 2014-08-14 DIAGNOSIS — E780 Pure hypercholesterolemia: Secondary | ICD-10-CM | POA: Diagnosis not present

## 2014-08-14 DIAGNOSIS — S3091XD Unspecified superficial injury of lower back and pelvis, subsequent encounter: Secondary | ICD-10-CM | POA: Diagnosis not present

## 2014-08-14 DIAGNOSIS — F329 Major depressive disorder, single episode, unspecified: Secondary | ICD-10-CM | POA: Diagnosis not present

## 2014-08-14 DIAGNOSIS — L89314 Pressure ulcer of right buttock, stage 4: Secondary | ICD-10-CM | POA: Diagnosis not present

## 2014-08-14 DIAGNOSIS — I10 Essential (primary) hypertension: Secondary | ICD-10-CM | POA: Diagnosis not present

## 2014-08-17 ENCOUNTER — Other Ambulatory Visit: Payer: Self-pay | Admitting: Family Medicine

## 2014-08-17 NOTE — Telephone Encounter (Signed)
Last office visit 07/24/2014.  Valium last refilled 06/19/2014 for #30 with no refills.  Ibuprofen last refilled 02/07/2014 for #90 with no refills.  Ok to refill?

## 2014-08-19 NOTE — Telephone Encounter (Signed)
Called into [facility] [address] 

## 2014-08-20 DIAGNOSIS — L89314 Pressure ulcer of right buttock, stage 4: Secondary | ICD-10-CM | POA: Diagnosis not present

## 2014-08-20 DIAGNOSIS — M62461 Contracture of muscle, right lower leg: Secondary | ICD-10-CM | POA: Diagnosis not present

## 2014-08-20 DIAGNOSIS — E780 Pure hypercholesterolemia: Secondary | ICD-10-CM | POA: Diagnosis not present

## 2014-08-20 DIAGNOSIS — F329 Major depressive disorder, single episode, unspecified: Secondary | ICD-10-CM | POA: Diagnosis not present

## 2014-08-20 DIAGNOSIS — K219 Gastro-esophageal reflux disease without esophagitis: Secondary | ICD-10-CM | POA: Diagnosis not present

## 2014-08-20 DIAGNOSIS — Z993 Dependence on wheelchair: Secondary | ICD-10-CM | POA: Diagnosis not present

## 2014-08-20 DIAGNOSIS — S3091XD Unspecified superficial injury of lower back and pelvis, subsequent encounter: Secondary | ICD-10-CM | POA: Diagnosis not present

## 2014-08-20 DIAGNOSIS — I10 Essential (primary) hypertension: Secondary | ICD-10-CM | POA: Diagnosis not present

## 2014-08-20 DIAGNOSIS — G35 Multiple sclerosis: Secondary | ICD-10-CM | POA: Diagnosis not present

## 2014-08-20 DIAGNOSIS — M62421 Contracture of muscle, right upper arm: Secondary | ICD-10-CM | POA: Diagnosis not present

## 2014-08-20 DIAGNOSIS — N390 Urinary tract infection, site not specified: Secondary | ICD-10-CM | POA: Diagnosis not present

## 2014-08-20 DIAGNOSIS — Z9181 History of falling: Secondary | ICD-10-CM | POA: Diagnosis not present

## 2014-08-20 DIAGNOSIS — M62462 Contracture of muscle, left lower leg: Secondary | ICD-10-CM | POA: Diagnosis not present

## 2014-08-20 DIAGNOSIS — Z466 Encounter for fitting and adjustment of urinary device: Secondary | ICD-10-CM | POA: Diagnosis not present

## 2014-08-21 LAB — URINE CULTURE

## 2014-08-22 DIAGNOSIS — M62461 Contracture of muscle, right lower leg: Secondary | ICD-10-CM | POA: Diagnosis not present

## 2014-08-22 DIAGNOSIS — M62421 Contracture of muscle, right upper arm: Secondary | ICD-10-CM | POA: Diagnosis not present

## 2014-08-22 DIAGNOSIS — F329 Major depressive disorder, single episode, unspecified: Secondary | ICD-10-CM | POA: Diagnosis not present

## 2014-08-22 DIAGNOSIS — E780 Pure hypercholesterolemia: Secondary | ICD-10-CM | POA: Diagnosis not present

## 2014-08-22 DIAGNOSIS — K219 Gastro-esophageal reflux disease without esophagitis: Secondary | ICD-10-CM | POA: Diagnosis not present

## 2014-08-22 DIAGNOSIS — M62462 Contracture of muscle, left lower leg: Secondary | ICD-10-CM | POA: Diagnosis not present

## 2014-08-22 DIAGNOSIS — G35 Multiple sclerosis: Secondary | ICD-10-CM | POA: Diagnosis not present

## 2014-08-22 DIAGNOSIS — L89314 Pressure ulcer of right buttock, stage 4: Secondary | ICD-10-CM | POA: Diagnosis not present

## 2014-08-22 DIAGNOSIS — Z9181 History of falling: Secondary | ICD-10-CM | POA: Diagnosis not present

## 2014-08-22 DIAGNOSIS — Z993 Dependence on wheelchair: Secondary | ICD-10-CM | POA: Diagnosis not present

## 2014-08-22 DIAGNOSIS — S3091XD Unspecified superficial injury of lower back and pelvis, subsequent encounter: Secondary | ICD-10-CM | POA: Diagnosis not present

## 2014-08-22 DIAGNOSIS — N390 Urinary tract infection, site not specified: Secondary | ICD-10-CM | POA: Diagnosis not present

## 2014-08-22 DIAGNOSIS — I10 Essential (primary) hypertension: Secondary | ICD-10-CM | POA: Diagnosis not present

## 2014-08-22 DIAGNOSIS — Z466 Encounter for fitting and adjustment of urinary device: Secondary | ICD-10-CM | POA: Diagnosis not present

## 2014-08-27 DIAGNOSIS — N390 Urinary tract infection, site not specified: Secondary | ICD-10-CM | POA: Diagnosis not present

## 2014-08-27 DIAGNOSIS — F329 Major depressive disorder, single episode, unspecified: Secondary | ICD-10-CM | POA: Diagnosis not present

## 2014-08-27 DIAGNOSIS — Z466 Encounter for fitting and adjustment of urinary device: Secondary | ICD-10-CM | POA: Diagnosis not present

## 2014-08-27 DIAGNOSIS — M62421 Contracture of muscle, right upper arm: Secondary | ICD-10-CM | POA: Diagnosis not present

## 2014-08-27 DIAGNOSIS — S3091XD Unspecified superficial injury of lower back and pelvis, subsequent encounter: Secondary | ICD-10-CM | POA: Diagnosis not present

## 2014-08-27 DIAGNOSIS — G35 Multiple sclerosis: Secondary | ICD-10-CM | POA: Diagnosis not present

## 2014-08-27 DIAGNOSIS — K219 Gastro-esophageal reflux disease without esophagitis: Secondary | ICD-10-CM | POA: Diagnosis not present

## 2014-08-27 DIAGNOSIS — L89314 Pressure ulcer of right buttock, stage 4: Secondary | ICD-10-CM | POA: Diagnosis not present

## 2014-08-27 DIAGNOSIS — M62461 Contracture of muscle, right lower leg: Secondary | ICD-10-CM | POA: Diagnosis not present

## 2014-08-27 DIAGNOSIS — Z993 Dependence on wheelchair: Secondary | ICD-10-CM | POA: Diagnosis not present

## 2014-08-27 DIAGNOSIS — M62462 Contracture of muscle, left lower leg: Secondary | ICD-10-CM | POA: Diagnosis not present

## 2014-08-27 DIAGNOSIS — I10 Essential (primary) hypertension: Secondary | ICD-10-CM | POA: Diagnosis not present

## 2014-08-27 DIAGNOSIS — E780 Pure hypercholesterolemia: Secondary | ICD-10-CM | POA: Diagnosis not present

## 2014-08-27 DIAGNOSIS — Z9181 History of falling: Secondary | ICD-10-CM | POA: Diagnosis not present

## 2014-08-29 DIAGNOSIS — L89314 Pressure ulcer of right buttock, stage 4: Secondary | ICD-10-CM | POA: Diagnosis not present

## 2014-08-29 DIAGNOSIS — F329 Major depressive disorder, single episode, unspecified: Secondary | ICD-10-CM | POA: Diagnosis not present

## 2014-08-29 DIAGNOSIS — M62462 Contracture of muscle, left lower leg: Secondary | ICD-10-CM | POA: Diagnosis not present

## 2014-08-29 DIAGNOSIS — G35 Multiple sclerosis: Secondary | ICD-10-CM | POA: Diagnosis not present

## 2014-08-29 DIAGNOSIS — Z993 Dependence on wheelchair: Secondary | ICD-10-CM | POA: Diagnosis not present

## 2014-08-29 DIAGNOSIS — I10 Essential (primary) hypertension: Secondary | ICD-10-CM | POA: Diagnosis not present

## 2014-08-29 DIAGNOSIS — N390 Urinary tract infection, site not specified: Secondary | ICD-10-CM | POA: Diagnosis not present

## 2014-08-29 DIAGNOSIS — Z9181 History of falling: Secondary | ICD-10-CM | POA: Diagnosis not present

## 2014-08-29 DIAGNOSIS — S3091XD Unspecified superficial injury of lower back and pelvis, subsequent encounter: Secondary | ICD-10-CM | POA: Diagnosis not present

## 2014-08-29 DIAGNOSIS — M62421 Contracture of muscle, right upper arm: Secondary | ICD-10-CM | POA: Diagnosis not present

## 2014-08-29 DIAGNOSIS — M62461 Contracture of muscle, right lower leg: Secondary | ICD-10-CM | POA: Diagnosis not present

## 2014-08-29 DIAGNOSIS — K219 Gastro-esophageal reflux disease without esophagitis: Secondary | ICD-10-CM | POA: Diagnosis not present

## 2014-08-29 DIAGNOSIS — Z466 Encounter for fitting and adjustment of urinary device: Secondary | ICD-10-CM | POA: Diagnosis not present

## 2014-08-29 DIAGNOSIS — E780 Pure hypercholesterolemia: Secondary | ICD-10-CM | POA: Diagnosis not present

## 2014-09-01 DIAGNOSIS — M62421 Contracture of muscle, right upper arm: Secondary | ICD-10-CM | POA: Diagnosis not present

## 2014-09-01 DIAGNOSIS — M62462 Contracture of muscle, left lower leg: Secondary | ICD-10-CM | POA: Diagnosis not present

## 2014-09-01 DIAGNOSIS — G35 Multiple sclerosis: Secondary | ICD-10-CM | POA: Diagnosis not present

## 2014-09-01 DIAGNOSIS — Z9181 History of falling: Secondary | ICD-10-CM | POA: Diagnosis not present

## 2014-09-01 DIAGNOSIS — N390 Urinary tract infection, site not specified: Secondary | ICD-10-CM | POA: Diagnosis not present

## 2014-09-01 DIAGNOSIS — I10 Essential (primary) hypertension: Secondary | ICD-10-CM | POA: Diagnosis not present

## 2014-09-01 DIAGNOSIS — M62461 Contracture of muscle, right lower leg: Secondary | ICD-10-CM | POA: Diagnosis not present

## 2014-09-01 DIAGNOSIS — Z993 Dependence on wheelchair: Secondary | ICD-10-CM | POA: Diagnosis not present

## 2014-09-01 DIAGNOSIS — L89314 Pressure ulcer of right buttock, stage 4: Secondary | ICD-10-CM | POA: Diagnosis not present

## 2014-09-01 DIAGNOSIS — Z466 Encounter for fitting and adjustment of urinary device: Secondary | ICD-10-CM | POA: Diagnosis not present

## 2014-09-01 DIAGNOSIS — S3091XD Unspecified superficial injury of lower back and pelvis, subsequent encounter: Secondary | ICD-10-CM | POA: Diagnosis not present

## 2014-09-01 DIAGNOSIS — K219 Gastro-esophageal reflux disease without esophagitis: Secondary | ICD-10-CM | POA: Diagnosis not present

## 2014-09-01 DIAGNOSIS — E780 Pure hypercholesterolemia: Secondary | ICD-10-CM | POA: Diagnosis not present

## 2014-09-01 DIAGNOSIS — F329 Major depressive disorder, single episode, unspecified: Secondary | ICD-10-CM | POA: Diagnosis not present

## 2014-09-02 ENCOUNTER — Other Ambulatory Visit: Payer: Self-pay | Admitting: Family Medicine

## 2014-09-02 DIAGNOSIS — Z466 Encounter for fitting and adjustment of urinary device: Secondary | ICD-10-CM | POA: Diagnosis not present

## 2014-09-02 DIAGNOSIS — Z993 Dependence on wheelchair: Secondary | ICD-10-CM

## 2014-09-02 DIAGNOSIS — M62421 Contracture of muscle, right upper arm: Secondary | ICD-10-CM

## 2014-09-02 DIAGNOSIS — G35 Multiple sclerosis: Secondary | ICD-10-CM

## 2014-09-02 DIAGNOSIS — L89314 Pressure ulcer of right buttock, stage 4: Secondary | ICD-10-CM | POA: Diagnosis not present

## 2014-09-02 DIAGNOSIS — S3091XD Unspecified superficial injury of lower back and pelvis, subsequent encounter: Secondary | ICD-10-CM | POA: Diagnosis not present

## 2014-09-02 DIAGNOSIS — Z9181 History of falling: Secondary | ICD-10-CM

## 2014-09-02 DIAGNOSIS — M62461 Contracture of muscle, right lower leg: Secondary | ICD-10-CM

## 2014-09-02 DIAGNOSIS — E780 Pure hypercholesterolemia: Secondary | ICD-10-CM

## 2014-09-02 DIAGNOSIS — M62462 Contracture of muscle, left lower leg: Secondary | ICD-10-CM

## 2014-09-02 DIAGNOSIS — I10 Essential (primary) hypertension: Secondary | ICD-10-CM

## 2014-09-02 DIAGNOSIS — K219 Gastro-esophageal reflux disease without esophagitis: Secondary | ICD-10-CM

## 2014-09-02 DIAGNOSIS — N390 Urinary tract infection, site not specified: Secondary | ICD-10-CM | POA: Diagnosis not present

## 2014-09-02 DIAGNOSIS — F329 Major depressive disorder, single episode, unspecified: Secondary | ICD-10-CM

## 2014-09-04 DIAGNOSIS — L89151 Pressure ulcer of sacral region, stage 1: Secondary | ICD-10-CM | POA: Diagnosis not present

## 2014-09-04 DIAGNOSIS — L89301 Pressure ulcer of unspecified buttock, stage 1: Secondary | ICD-10-CM | POA: Diagnosis not present

## 2014-09-04 DIAGNOSIS — M6281 Muscle weakness (generalized): Secondary | ICD-10-CM | POA: Diagnosis not present

## 2014-09-04 DIAGNOSIS — G35 Multiple sclerosis: Secondary | ICD-10-CM | POA: Diagnosis not present

## 2014-09-04 DIAGNOSIS — Z9181 History of falling: Secondary | ICD-10-CM | POA: Diagnosis not present

## 2014-09-12 DIAGNOSIS — Z466 Encounter for fitting and adjustment of urinary device: Secondary | ICD-10-CM | POA: Diagnosis not present

## 2014-09-12 DIAGNOSIS — Z993 Dependence on wheelchair: Secondary | ICD-10-CM | POA: Diagnosis not present

## 2014-09-12 DIAGNOSIS — Z9181 History of falling: Secondary | ICD-10-CM | POA: Diagnosis not present

## 2014-09-12 DIAGNOSIS — E780 Pure hypercholesterolemia: Secondary | ICD-10-CM | POA: Diagnosis not present

## 2014-09-12 DIAGNOSIS — I10 Essential (primary) hypertension: Secondary | ICD-10-CM | POA: Diagnosis not present

## 2014-09-12 DIAGNOSIS — L89152 Pressure ulcer of sacral region, stage 2: Secondary | ICD-10-CM | POA: Diagnosis not present

## 2014-09-12 DIAGNOSIS — F329 Major depressive disorder, single episode, unspecified: Secondary | ICD-10-CM | POA: Diagnosis not present

## 2014-09-12 DIAGNOSIS — K219 Gastro-esophageal reflux disease without esophagitis: Secondary | ICD-10-CM | POA: Diagnosis not present

## 2014-09-12 DIAGNOSIS — M62462 Contracture of muscle, left lower leg: Secondary | ICD-10-CM | POA: Diagnosis not present

## 2014-09-12 DIAGNOSIS — L89314 Pressure ulcer of right buttock, stage 4: Secondary | ICD-10-CM | POA: Diagnosis not present

## 2014-09-12 DIAGNOSIS — G35 Multiple sclerosis: Secondary | ICD-10-CM | POA: Diagnosis not present

## 2014-09-12 DIAGNOSIS — N390 Urinary tract infection, site not specified: Secondary | ICD-10-CM | POA: Diagnosis not present

## 2014-09-12 DIAGNOSIS — S3091XD Unspecified superficial injury of lower back and pelvis, subsequent encounter: Secondary | ICD-10-CM | POA: Diagnosis not present

## 2014-09-12 DIAGNOSIS — R32 Unspecified urinary incontinence: Secondary | ICD-10-CM | POA: Diagnosis not present

## 2014-09-12 DIAGNOSIS — M62461 Contracture of muscle, right lower leg: Secondary | ICD-10-CM | POA: Diagnosis not present

## 2014-09-12 DIAGNOSIS — M62421 Contracture of muscle, right upper arm: Secondary | ICD-10-CM | POA: Diagnosis not present

## 2014-09-18 DIAGNOSIS — G35 Multiple sclerosis: Secondary | ICD-10-CM | POA: Diagnosis not present

## 2014-09-18 DIAGNOSIS — Z993 Dependence on wheelchair: Secondary | ICD-10-CM | POA: Diagnosis not present

## 2014-09-18 DIAGNOSIS — M62421 Contracture of muscle, right upper arm: Secondary | ICD-10-CM | POA: Diagnosis not present

## 2014-09-18 DIAGNOSIS — Z9181 History of falling: Secondary | ICD-10-CM | POA: Diagnosis not present

## 2014-09-18 DIAGNOSIS — E780 Pure hypercholesterolemia: Secondary | ICD-10-CM | POA: Diagnosis not present

## 2014-09-18 DIAGNOSIS — M62462 Contracture of muscle, left lower leg: Secondary | ICD-10-CM | POA: Diagnosis not present

## 2014-09-18 DIAGNOSIS — I10 Essential (primary) hypertension: Secondary | ICD-10-CM | POA: Diagnosis not present

## 2014-09-18 DIAGNOSIS — S3091XD Unspecified superficial injury of lower back and pelvis, subsequent encounter: Secondary | ICD-10-CM | POA: Diagnosis not present

## 2014-09-18 DIAGNOSIS — Z466 Encounter for fitting and adjustment of urinary device: Secondary | ICD-10-CM | POA: Diagnosis not present

## 2014-09-18 DIAGNOSIS — L89314 Pressure ulcer of right buttock, stage 4: Secondary | ICD-10-CM | POA: Diagnosis not present

## 2014-09-18 DIAGNOSIS — K219 Gastro-esophageal reflux disease without esophagitis: Secondary | ICD-10-CM | POA: Diagnosis not present

## 2014-09-18 DIAGNOSIS — M62461 Contracture of muscle, right lower leg: Secondary | ICD-10-CM | POA: Diagnosis not present

## 2014-09-18 DIAGNOSIS — F329 Major depressive disorder, single episode, unspecified: Secondary | ICD-10-CM | POA: Diagnosis not present

## 2014-09-18 DIAGNOSIS — N390 Urinary tract infection, site not specified: Secondary | ICD-10-CM | POA: Diagnosis not present

## 2014-09-25 ENCOUNTER — Other Ambulatory Visit: Payer: Self-pay | Admitting: Family Medicine

## 2014-09-25 DIAGNOSIS — L89314 Pressure ulcer of right buttock, stage 4: Secondary | ICD-10-CM | POA: Diagnosis not present

## 2014-09-25 DIAGNOSIS — Z993 Dependence on wheelchair: Secondary | ICD-10-CM | POA: Diagnosis not present

## 2014-09-25 DIAGNOSIS — M62421 Contracture of muscle, right upper arm: Secondary | ICD-10-CM | POA: Diagnosis not present

## 2014-09-25 DIAGNOSIS — N390 Urinary tract infection, site not specified: Secondary | ICD-10-CM | POA: Diagnosis not present

## 2014-09-25 DIAGNOSIS — K219 Gastro-esophageal reflux disease without esophagitis: Secondary | ICD-10-CM | POA: Diagnosis not present

## 2014-09-25 DIAGNOSIS — Z9181 History of falling: Secondary | ICD-10-CM | POA: Diagnosis not present

## 2014-09-25 DIAGNOSIS — S3091XD Unspecified superficial injury of lower back and pelvis, subsequent encounter: Secondary | ICD-10-CM | POA: Diagnosis not present

## 2014-09-25 DIAGNOSIS — M62462 Contracture of muscle, left lower leg: Secondary | ICD-10-CM | POA: Diagnosis not present

## 2014-09-25 DIAGNOSIS — E780 Pure hypercholesterolemia: Secondary | ICD-10-CM | POA: Diagnosis not present

## 2014-09-25 DIAGNOSIS — F329 Major depressive disorder, single episode, unspecified: Secondary | ICD-10-CM | POA: Diagnosis not present

## 2014-09-25 DIAGNOSIS — G35 Multiple sclerosis: Secondary | ICD-10-CM | POA: Diagnosis not present

## 2014-09-25 DIAGNOSIS — M62461 Contracture of muscle, right lower leg: Secondary | ICD-10-CM | POA: Diagnosis not present

## 2014-09-25 DIAGNOSIS — Z466 Encounter for fitting and adjustment of urinary device: Secondary | ICD-10-CM | POA: Diagnosis not present

## 2014-09-25 DIAGNOSIS — I10 Essential (primary) hypertension: Secondary | ICD-10-CM | POA: Diagnosis not present

## 2014-09-25 NOTE — Telephone Encounter (Signed)
Last office visit 07/24/2014.  Both medications last refilled 08/19/2014.  Ok to refill?

## 2014-09-26 NOTE — Telephone Encounter (Signed)
Diazepam called into [facility] [address] 

## 2014-10-02 DIAGNOSIS — M62421 Contracture of muscle, right upper arm: Secondary | ICD-10-CM | POA: Diagnosis not present

## 2014-10-02 DIAGNOSIS — M62461 Contracture of muscle, right lower leg: Secondary | ICD-10-CM | POA: Diagnosis not present

## 2014-10-02 DIAGNOSIS — Z466 Encounter for fitting and adjustment of urinary device: Secondary | ICD-10-CM | POA: Diagnosis not present

## 2014-10-02 DIAGNOSIS — Z993 Dependence on wheelchair: Secondary | ICD-10-CM | POA: Diagnosis not present

## 2014-10-02 DIAGNOSIS — I10 Essential (primary) hypertension: Secondary | ICD-10-CM | POA: Diagnosis not present

## 2014-10-02 DIAGNOSIS — L89314 Pressure ulcer of right buttock, stage 4: Secondary | ICD-10-CM | POA: Diagnosis not present

## 2014-10-02 DIAGNOSIS — E780 Pure hypercholesterolemia: Secondary | ICD-10-CM | POA: Diagnosis not present

## 2014-10-02 DIAGNOSIS — N390 Urinary tract infection, site not specified: Secondary | ICD-10-CM | POA: Diagnosis not present

## 2014-10-02 DIAGNOSIS — G35 Multiple sclerosis: Secondary | ICD-10-CM | POA: Diagnosis not present

## 2014-10-02 DIAGNOSIS — F329 Major depressive disorder, single episode, unspecified: Secondary | ICD-10-CM | POA: Diagnosis not present

## 2014-10-02 DIAGNOSIS — M62462 Contracture of muscle, left lower leg: Secondary | ICD-10-CM | POA: Diagnosis not present

## 2014-10-02 DIAGNOSIS — Z9181 History of falling: Secondary | ICD-10-CM | POA: Diagnosis not present

## 2014-10-02 DIAGNOSIS — K219 Gastro-esophageal reflux disease without esophagitis: Secondary | ICD-10-CM | POA: Diagnosis not present

## 2014-10-02 DIAGNOSIS — S3091XD Unspecified superficial injury of lower back and pelvis, subsequent encounter: Secondary | ICD-10-CM | POA: Diagnosis not present

## 2014-10-05 DIAGNOSIS — L89151 Pressure ulcer of sacral region, stage 1: Secondary | ICD-10-CM | POA: Diagnosis not present

## 2014-10-05 DIAGNOSIS — L89301 Pressure ulcer of unspecified buttock, stage 1: Secondary | ICD-10-CM | POA: Diagnosis not present

## 2014-10-16 ENCOUNTER — Other Ambulatory Visit: Payer: Self-pay | Admitting: Family Medicine

## 2014-10-16 NOTE — Telephone Encounter (Signed)
Last office visit 07/24/2014.  Last refilled 07/25/2014 for #360.  Ok to refill?

## 2014-10-17 ENCOUNTER — Other Ambulatory Visit: Payer: Self-pay | Admitting: *Deleted

## 2014-10-17 DIAGNOSIS — M62461 Contracture of muscle, right lower leg: Secondary | ICD-10-CM | POA: Diagnosis not present

## 2014-10-17 DIAGNOSIS — I10 Essential (primary) hypertension: Secondary | ICD-10-CM | POA: Diagnosis not present

## 2014-10-17 DIAGNOSIS — F329 Major depressive disorder, single episode, unspecified: Secondary | ICD-10-CM | POA: Diagnosis not present

## 2014-10-17 DIAGNOSIS — M62462 Contracture of muscle, left lower leg: Secondary | ICD-10-CM | POA: Diagnosis not present

## 2014-10-17 DIAGNOSIS — Z993 Dependence on wheelchair: Secondary | ICD-10-CM | POA: Diagnosis not present

## 2014-10-17 DIAGNOSIS — G35 Multiple sclerosis: Secondary | ICD-10-CM | POA: Diagnosis not present

## 2014-10-17 DIAGNOSIS — L89314 Pressure ulcer of right buttock, stage 4: Secondary | ICD-10-CM | POA: Diagnosis not present

## 2014-10-17 DIAGNOSIS — M62421 Contracture of muscle, right upper arm: Secondary | ICD-10-CM | POA: Diagnosis not present

## 2014-10-17 DIAGNOSIS — Z466 Encounter for fitting and adjustment of urinary device: Secondary | ICD-10-CM | POA: Diagnosis not present

## 2014-10-17 DIAGNOSIS — N390 Urinary tract infection, site not specified: Secondary | ICD-10-CM | POA: Diagnosis not present

## 2014-10-17 DIAGNOSIS — S3091XD Unspecified superficial injury of lower back and pelvis, subsequent encounter: Secondary | ICD-10-CM | POA: Diagnosis not present

## 2014-10-17 DIAGNOSIS — K219 Gastro-esophageal reflux disease without esophagitis: Secondary | ICD-10-CM | POA: Diagnosis not present

## 2014-10-17 DIAGNOSIS — Z9181 History of falling: Secondary | ICD-10-CM | POA: Diagnosis not present

## 2014-10-17 DIAGNOSIS — E780 Pure hypercholesterolemia: Secondary | ICD-10-CM | POA: Diagnosis not present

## 2014-10-17 MED ORDER — OXYBUTYNIN CHLORIDE 5 MG PO TABS: 5.0000 mg | ORAL_TABLET | Freq: Three times a day (TID) | ORAL | Status: DC | PRN

## 2014-10-17 NOTE — Telephone Encounter (Signed)
Rx faxed to [facility] [address] at [phone]. 

## 2014-10-21 DIAGNOSIS — E780 Pure hypercholesterolemia: Secondary | ICD-10-CM | POA: Diagnosis not present

## 2014-10-21 DIAGNOSIS — M62421 Contracture of muscle, right upper arm: Secondary | ICD-10-CM | POA: Diagnosis not present

## 2014-10-21 DIAGNOSIS — N390 Urinary tract infection, site not specified: Secondary | ICD-10-CM | POA: Diagnosis not present

## 2014-10-21 DIAGNOSIS — M62461 Contracture of muscle, right lower leg: Secondary | ICD-10-CM | POA: Diagnosis not present

## 2014-10-21 DIAGNOSIS — G35 Multiple sclerosis: Secondary | ICD-10-CM | POA: Diagnosis not present

## 2014-10-21 DIAGNOSIS — Z993 Dependence on wheelchair: Secondary | ICD-10-CM | POA: Diagnosis not present

## 2014-10-21 DIAGNOSIS — L89314 Pressure ulcer of right buttock, stage 4: Secondary | ICD-10-CM | POA: Diagnosis not present

## 2014-10-21 DIAGNOSIS — K219 Gastro-esophageal reflux disease without esophagitis: Secondary | ICD-10-CM | POA: Diagnosis not present

## 2014-10-21 DIAGNOSIS — S3091XD Unspecified superficial injury of lower back and pelvis, subsequent encounter: Secondary | ICD-10-CM | POA: Diagnosis not present

## 2014-10-21 DIAGNOSIS — Z9181 History of falling: Secondary | ICD-10-CM | POA: Diagnosis not present

## 2014-10-21 DIAGNOSIS — Z466 Encounter for fitting and adjustment of urinary device: Secondary | ICD-10-CM | POA: Diagnosis not present

## 2014-10-21 DIAGNOSIS — F329 Major depressive disorder, single episode, unspecified: Secondary | ICD-10-CM | POA: Diagnosis not present

## 2014-10-21 DIAGNOSIS — M62462 Contracture of muscle, left lower leg: Secondary | ICD-10-CM | POA: Diagnosis not present

## 2014-10-21 DIAGNOSIS — I10 Essential (primary) hypertension: Secondary | ICD-10-CM | POA: Diagnosis not present

## 2014-10-27 ENCOUNTER — Other Ambulatory Visit: Payer: Self-pay | Admitting: Family Medicine

## 2014-10-27 NOTE — Telephone Encounter (Signed)
Last office visit 07/24/2014.  Last refilled 09/25/2014.  Ok to refill?

## 2014-10-28 DIAGNOSIS — E7800 Pure hypercholesterolemia, unspecified: Secondary | ICD-10-CM | POA: Diagnosis not present

## 2014-10-28 DIAGNOSIS — I10 Essential (primary) hypertension: Secondary | ICD-10-CM | POA: Diagnosis not present

## 2014-10-28 DIAGNOSIS — L89154 Pressure ulcer of sacral region, stage 4: Secondary | ICD-10-CM | POA: Diagnosis not present

## 2014-10-28 DIAGNOSIS — G35 Multiple sclerosis: Secondary | ICD-10-CM | POA: Diagnosis not present

## 2014-10-28 DIAGNOSIS — Z87440 Personal history of urinary (tract) infections: Secondary | ICD-10-CM | POA: Diagnosis not present

## 2014-10-28 DIAGNOSIS — F329 Major depressive disorder, single episode, unspecified: Secondary | ICD-10-CM | POA: Diagnosis not present

## 2014-10-28 DIAGNOSIS — M62421 Contracture of muscle, right upper arm: Secondary | ICD-10-CM | POA: Diagnosis not present

## 2014-10-28 DIAGNOSIS — Z466 Encounter for fitting and adjustment of urinary device: Secondary | ICD-10-CM | POA: Diagnosis not present

## 2014-10-28 DIAGNOSIS — M62462 Contracture of muscle, left lower leg: Secondary | ICD-10-CM | POA: Diagnosis not present

## 2014-10-28 DIAGNOSIS — K219 Gastro-esophageal reflux disease without esophagitis: Secondary | ICD-10-CM | POA: Diagnosis not present

## 2014-10-28 DIAGNOSIS — M62461 Contracture of muscle, right lower leg: Secondary | ICD-10-CM | POA: Diagnosis not present

## 2014-10-28 DIAGNOSIS — Z993 Dependence on wheelchair: Secondary | ICD-10-CM | POA: Diagnosis not present

## 2014-10-28 DIAGNOSIS — Z9181 History of falling: Secondary | ICD-10-CM | POA: Diagnosis not present

## 2014-10-28 NOTE — Telephone Encounter (Signed)
Diazepam called into [facility] [address] 

## 2014-11-03 ENCOUNTER — Emergency Department: Payer: Medicare Other

## 2014-11-03 ENCOUNTER — Encounter: Payer: Self-pay | Admitting: Emergency Medicine

## 2014-11-03 ENCOUNTER — Inpatient Hospital Stay
Admission: EM | Admit: 2014-11-03 | Discharge: 2014-11-05 | DRG: 698 | Disposition: A | Discharge status: Home / Self Care | Payer: Medicare Other | Attending: Internal Medicine | Admitting: Internal Medicine

## 2014-11-03 DIAGNOSIS — K219 Gastro-esophageal reflux disease without esophagitis: Secondary | ICD-10-CM | POA: Diagnosis present

## 2014-11-03 DIAGNOSIS — A419 Sepsis, unspecified organism: Secondary | ICD-10-CM | POA: Diagnosis not present

## 2014-11-03 DIAGNOSIS — T83511A Infection and inflammatory reaction due to indwelling urethral catheter, initial encounter: Secondary | ICD-10-CM | POA: Diagnosis not present

## 2014-11-03 DIAGNOSIS — R404 Transient alteration of awareness: Secondary | ICD-10-CM | POA: Diagnosis not present

## 2014-11-03 DIAGNOSIS — G8220 Paraplegia, unspecified: Secondary | ICD-10-CM | POA: Diagnosis not present

## 2014-11-03 DIAGNOSIS — Z807 Family history of other malignant neoplasms of lymphoid, hematopoietic and related tissues: Secondary | ICD-10-CM | POA: Diagnosis not present

## 2014-11-03 DIAGNOSIS — Z79899 Other long term (current) drug therapy: Secondary | ICD-10-CM | POA: Diagnosis not present

## 2014-11-03 DIAGNOSIS — R6510 Systemic inflammatory response syndrome (SIRS) of non-infectious origin without acute organ dysfunction: Secondary | ICD-10-CM

## 2014-11-03 DIAGNOSIS — N179 Acute kidney failure, unspecified: Secondary | ICD-10-CM | POA: Diagnosis present

## 2014-11-03 DIAGNOSIS — G35 Multiple sclerosis: Secondary | ICD-10-CM | POA: Diagnosis present

## 2014-11-03 DIAGNOSIS — Z8249 Family history of ischemic heart disease and other diseases of the circulatory system: Secondary | ICD-10-CM

## 2014-11-03 DIAGNOSIS — E785 Hyperlipidemia, unspecified: Secondary | ICD-10-CM | POA: Diagnosis present

## 2014-11-03 DIAGNOSIS — Z833 Family history of diabetes mellitus: Secondary | ICD-10-CM | POA: Diagnosis not present

## 2014-11-03 DIAGNOSIS — I10 Essential (primary) hypertension: Secondary | ICD-10-CM | POA: Diagnosis not present

## 2014-11-03 DIAGNOSIS — F329 Major depressive disorder, single episode, unspecified: Secondary | ICD-10-CM | POA: Diagnosis present

## 2014-11-03 DIAGNOSIS — N390 Urinary tract infection, site not specified: Secondary | ICD-10-CM

## 2014-11-03 DIAGNOSIS — Z888 Allergy status to other drugs, medicaments and biological substances status: Secondary | ICD-10-CM

## 2014-11-03 DIAGNOSIS — M6281 Muscle weakness (generalized): Secondary | ICD-10-CM | POA: Diagnosis not present

## 2014-11-03 DIAGNOSIS — R509 Fever, unspecified: Secondary | ICD-10-CM | POA: Diagnosis not present

## 2014-11-03 DIAGNOSIS — R531 Weakness: Secondary | ICD-10-CM | POA: Diagnosis not present

## 2014-11-03 DIAGNOSIS — N3001 Acute cystitis with hematuria: Secondary | ICD-10-CM | POA: Diagnosis present

## 2014-11-03 LAB — CBC WITH DIFFERENTIAL/PLATELET
BASOS ABS: 0.1 10*3/uL (ref 0–0.1)
BASOS PCT: 0 %
EOS ABS: 0 10*3/uL (ref 0–0.7)
Eosinophils Relative: 0 %
HEMATOCRIT: 43.9 % (ref 40.0–52.0)
HEMOGLOBIN: 14.3 g/dL (ref 13.0–18.0)
Lymphocytes Relative: 4 %
Lymphs Abs: 1 10*3/uL (ref 1.0–3.6)
MCH: 26.4 pg (ref 26.0–34.0)
MCHC: 32.6 g/dL (ref 32.0–36.0)
MCV: 81.1 fL (ref 80.0–100.0)
MONOS PCT: 5 %
Monocytes Absolute: 1.4 10*3/uL — ABNORMAL HIGH (ref 0.2–1.0)
NEUTROS ABS: 24.8 10*3/uL — AB (ref 1.4–6.5)
NEUTROS PCT: 91 %
Platelets: 305 10*3/uL (ref 150–440)
RBC: 5.41 MIL/uL (ref 4.40–5.90)
RDW: 14 % (ref 11.5–14.5)
WBC: 27.2 10*3/uL — AB (ref 3.8–10.6)

## 2014-11-03 LAB — URINALYSIS COMPLETE WITH MICROSCOPIC (ARMC ONLY)
Bilirubin Urine: NEGATIVE
Glucose, UA: NEGATIVE mg/dL
KETONES UR: NEGATIVE mg/dL
NITRITE: NEGATIVE
PH: 8 (ref 5.0–8.0)
PROTEIN: 100 mg/dL — AB
SPECIFIC GRAVITY, URINE: 1.011 (ref 1.005–1.030)
SQUAMOUS EPITHELIAL / LPF: NONE SEEN

## 2014-11-03 LAB — TROPONIN I: Troponin I: <0.03 ng/mL (ref <0.031)

## 2014-11-03 LAB — COMPREHENSIVE METABOLIC PANEL
ALBUMIN: 3.7 g/dL (ref 3.5–5.0)
ALK PHOS: 92 U/L (ref 38–126)
ALT: 13 U/L — AB (ref 17–63)
AST: 17 U/L (ref 15–41)
Anion gap: 8 (ref 5–15)
BILIRUBIN TOTAL: 1 mg/dL (ref 0.3–1.2)
BUN: 25 mg/dL — AB (ref 6–20)
CALCIUM: 9 mg/dL (ref 8.9–10.3)
CO2: 26 mmol/L (ref 22–32)
CREATININE: 1.36 mg/dL — AB (ref 0.61–1.24)
Chloride: 106 mmol/L (ref 101–111)
GFR calc Af Amer: >60 mL/min (ref >60)
GFR calc non Af Amer: 58 mL/min — ABNORMAL LOW (ref >60)
Glucose, Bld: 178 mg/dL — ABNORMAL HIGH (ref 65–99)
Potassium: 4.2 mmol/L (ref 3.5–5.1)
SODIUM: 140 mmol/L (ref 135–145)
TOTAL PROTEIN: 7.3 g/dL (ref 6.5–8.1)

## 2014-11-03 LAB — TSH: TSH: 2.441 u[IU]/mL (ref 0.350–4.500)

## 2014-11-03 LAB — HEMOGLOBIN A1C: Hgb A1c MFr Bld: 5.2 % (ref 4.0–6.0)

## 2014-11-03 LAB — LACTIC ACID, PLASMA: Lactic Acid, Venous: 1.7 mmol/L (ref 0.5–2.0)

## 2014-11-03 MED ORDER — FLUOXETINE HCL 20 MG PO CAPS
20.0000 mg | ORAL_CAPSULE | Freq: Every day | ORAL | Status: DC
  Administered 2014-11-03 – 2014-11-05 (×3): 20 mg via ORAL
  Filled 2014-11-03 (×3): qty 1

## 2014-11-03 MED ORDER — CIPROFLOXACIN HCL 500 MG PO TABS
500.0000 mg | ORAL_TABLET | Freq: Two times a day (BID) | ORAL | Status: DC
  Administered 2014-11-03: 500 mg via ORAL
  Filled 2014-11-03: qty 1

## 2014-11-03 MED ORDER — VANCOMYCIN HCL IN DEXTROSE 1-5 GM/200ML-% IV SOLN
1000.0000 mg | Freq: Two times a day (BID) | INTRAVENOUS | Status: DC
  Administered 2014-11-04: 1000 mg via INTRAVENOUS
  Filled 2014-11-03 (×2): qty 200

## 2014-11-03 MED ORDER — VANCOMYCIN HCL IN DEXTROSE 1-5 GM/200ML-% IV SOLN
1000.0000 mg | Freq: Once | INTRAVENOUS | Status: AC
  Administered 2014-11-03: 1000 mg via INTRAVENOUS
  Filled 2014-11-03: qty 200

## 2014-11-03 MED ORDER — ACETAMINOPHEN 500 MG PO TABS
1000.0000 mg | ORAL_TABLET | Freq: Once | ORAL | Status: AC
  Administered 2014-11-03: 1000 mg via ORAL
  Filled 2014-11-03: qty 2

## 2014-11-03 MED ORDER — DEXTROSE 5 % IV SOLN
2.0000 g | INTRAVENOUS | Status: DC
  Administered 2014-11-03 – 2014-11-05 (×3): 2 g via INTRAVENOUS
  Filled 2014-11-03 (×3): qty 2

## 2014-11-03 MED ORDER — ATORVASTATIN CALCIUM 20 MG PO TABS
40.0000 mg | ORAL_TABLET | Freq: Every day | ORAL | Status: DC
  Administered 2014-11-03 – 2014-11-05 (×3): 40 mg via ORAL
  Filled 2014-11-03 (×3): qty 2

## 2014-11-03 MED ORDER — OXYBUTYNIN CHLORIDE 5 MG PO TABS: 5.0000 mg | ORAL_TABLET | Freq: Three times a day (TID) | ORAL | Status: DC | PRN

## 2014-11-03 MED ORDER — LISINOPRIL 10 MG PO TABS
10.0000 mg | ORAL_TABLET | Freq: Every day | ORAL | Status: DC
  Administered 2014-11-03 – 2014-11-05 (×3): 10 mg via ORAL
  Filled 2014-11-03 (×3): qty 1

## 2014-11-03 MED ORDER — GABAPENTIN 300 MG PO CAPS
900.0000 mg | ORAL_CAPSULE | Freq: Three times a day (TID) | ORAL | Status: DC
  Administered 2014-11-03 – 2014-11-05 (×7): 900 mg via ORAL
  Filled 2014-11-03 (×7): qty 3

## 2014-11-03 MED ORDER — SODIUM CHLORIDE 0.9 % IV BOLUS (SEPSIS)
1000.0000 mL | Freq: Once | INTRAVENOUS | Status: AC
  Administered 2014-11-03: 1000 mL via INTRAVENOUS

## 2014-11-03 MED ORDER — SODIUM CHLORIDE 0.9 % IV SOLN
INTRAVENOUS | Status: DC
  Administered 2014-11-03 – 2014-11-04 (×2): via INTRAVENOUS

## 2014-11-03 MED ORDER — AMLODIPINE BESYLATE 5 MG PO TABS
5.0000 mg | ORAL_TABLET | Freq: Every day | ORAL | Status: DC
  Administered 2014-11-03 – 2014-11-05 (×3): 5 mg via ORAL
  Filled 2014-11-03 (×3): qty 1

## 2014-11-03 MED ORDER — SODIUM CHLORIDE 0.9 % IV BOLUS (SEPSIS)
1000.0000 mL | INTRAVENOUS | Status: AC
  Administered 2014-11-03 (×2): 1000 mL via INTRAVENOUS

## 2014-11-03 MED ORDER — IBUPROFEN 400 MG PO TABS: 800.0000 mg | ORAL_TABLET | Freq: Three times a day (TID) | ORAL | Status: DC | PRN

## 2014-11-03 MED ORDER — METOPROLOL SUCCINATE ER 25 MG PO TB24
25.0000 mg | ORAL_TABLET | Freq: Every day | ORAL | Status: DC
Start: 2014-11-03 — End: 2014-11-05
  Administered 2014-11-04 – 2014-11-05 (×2): 25 mg via ORAL
  Filled 2014-11-03 (×3): qty 1

## 2014-11-03 MED ORDER — DOCUSATE SODIUM 100 MG PO CAPS
100.0000 mg | ORAL_CAPSULE | Freq: Two times a day (BID) | ORAL | Status: DC
  Administered 2014-11-03 – 2014-11-05 (×5): 100 mg via ORAL
  Filled 2014-11-03 (×5): qty 1

## 2014-11-03 MED ORDER — MORPHINE SULFATE (PF) 2 MG/ML IV SOLN: 1.0000 mg | INTRAVENOUS | Status: DC | PRN

## 2014-11-03 MED ORDER — ENOXAPARIN SODIUM 40 MG/0.4ML ~~LOC~~ SOLN
40.0000 mg | SUBCUTANEOUS | Status: DC
  Administered 2014-11-03 – 2014-11-04 (×2): 40 mg via SUBCUTANEOUS
  Filled 2014-11-03 (×2): qty 0.4

## 2014-11-03 MED ORDER — HEPARIN SODIUM (PORCINE) 5000 UNIT/ML IJ SOLN
5000.0000 [IU] | Freq: Three times a day (TID) | INTRAMUSCULAR | Status: DC
  Administered 2014-11-03: 5000 [IU] via SUBCUTANEOUS
  Filled 2014-11-03: qty 1

## 2014-11-03 MED ORDER — PANTOPRAZOLE SODIUM 40 MG PO TBEC
40.0000 mg | DELAYED_RELEASE_TABLET | Freq: Every day | ORAL | Status: DC
  Administered 2014-11-03 – 2014-11-05 (×3): 40 mg via ORAL
  Filled 2014-11-03 (×3): qty 1

## 2014-11-03 MED ORDER — SACCHAROMYCES BOULARDII 250 MG PO CAPS
250.0000 mg | ORAL_CAPSULE | Freq: Two times a day (BID) | ORAL | Status: DC
  Administered 2014-11-03 – 2014-11-05 (×5): 250 mg via ORAL
  Filled 2014-11-03 (×6): qty 1

## 2014-11-03 MED ORDER — ENSURE ENLIVE PO LIQD
237.0000 mL | Freq: Two times a day (BID) | ORAL | Status: DC
  Administered 2014-11-03 – 2014-11-05 (×5): 237 mL via ORAL

## 2014-11-03 MED ORDER — ACETAMINOPHEN 325 MG PO TABS: 650.0000 mg | ORAL_TABLET | Freq: Four times a day (QID) | ORAL | Status: DC | PRN

## 2014-11-03 MED ORDER — LISINOPRIL 10 MG PO TABS: 10.0000 mg | ORAL_TABLET | Freq: Every day | ORAL | Status: DC

## 2014-11-03 MED ORDER — VITAMIN D 1000 UNITS PO TABS
1000.0000 [IU] | ORAL_TABLET | Freq: Every day | ORAL | Status: DC
  Administered 2014-11-03 – 2014-11-05 (×3): 1000 [IU] via ORAL
  Filled 2014-11-03 (×3): qty 1

## 2014-11-03 MED ORDER — DIAZEPAM 5 MG PO TABS: 5.0000 mg | ORAL_TABLET | Freq: Two times a day (BID) | ORAL | Status: DC | PRN

## 2014-11-03 MED ORDER — ONDANSETRON HCL 4 MG PO TABS: 4.0000 mg | ORAL_TABLET | Freq: Four times a day (QID) | ORAL | Status: DC | PRN

## 2014-11-03 MED ORDER — INFLUENZA VAC SPLIT QUAD 0.5 ML IM SUSY
0.5000 mL | PREFILLED_SYRINGE | INTRAMUSCULAR | Status: AC
  Administered 2014-11-05: 0.5 mL via INTRAMUSCULAR
  Filled 2014-11-03 (×2): qty 0.5

## 2014-11-03 MED ORDER — ONDANSETRON HCL 4 MG/2ML IJ SOLN: 4.0000 mg | Freq: Four times a day (QID) | INTRAMUSCULAR | Status: DC | PRN

## 2014-11-03 MED ORDER — SODIUM CHLORIDE 0.9 % IJ SOLN
3.0000 mL | Freq: Two times a day (BID) | INTRAMUSCULAR | Status: DC
  Administered 2014-11-03 – 2014-11-04 (×2): 3 mL via INTRAVENOUS

## 2014-11-03 NOTE — Progress Notes (Signed)
Can not ambulate no sensation from chest to feet

## 2014-11-03 NOTE — Progress Notes (Signed)
ANTIBIOTIC CONSULT NOTE - INITIAL  Pharmacy Consult for Vancomycin Indication: rule out sepsis  Allergies  Allergen Reactions  . Baclofen Diarrhea    Patient Measurements: Weight: 215 lb (97.523 kg) Adjusted Body Weight: 82.8 kg  Vital Signs: Temp: 97.6 F (36.4 C) (10/10 0746) Temp Source: Oral (10/10 0746) BP: 106/70 mmHg (10/10 0746) Pulse Rate: 52 (10/10 0746) Intake/Output from previous day: 10/09 0701 - 10/10 0700 In: -  Out: 350 [Urine:350] Intake/Output from this shift:    Labs:  Recent Labs  11/03/14 0304  WBC 27.2*  HGB 14.3  PLT 305  CREATININE 1.36*   Estimated Creatinine Clearance: 72.7 mL/min (by C-G formula based on Cr of 1.36). No results for input(s): VANCOTROUGH, VANCOPEAK, VANCORANDOM, GENTTROUGH, GENTPEAK, GENTRANDOM, TOBRATROUGH, TOBRAPEAK, TOBRARND, AMIKACINPEAK, AMIKACINTROU, AMIKACIN in the last 72 hours.   Microbiology: Recent Results (from the past 720 hour(s))  Culture, blood (routine x 2)     Status: None (Preliminary result)   Collection Time: 11/03/14  3:34 AM  Result Value Ref Range Status   Specimen Description BLOOD LEFT HAND  Final   Special Requests BOTTLES DRAWN AEROBIC AND ANAEROBIC 4CC  Final   Culture NO GROWTH < 12 HOURS  Final   Report Status PENDING  Incomplete  Culture, blood (routine x 2)     Status: None (Preliminary result)   Collection Time: 11/03/14  3:34 AM  Result Value Ref Range Status   Specimen Description BLOOD LEFT ASSIST CONTROL  Final   Special Requests BOTTLES DRAWN AEROBIC AND ANAEROBIC 5CC  Final   Culture NO GROWTH < 12 HOURS  Final   Report Status PENDING  Incomplete    Medical History: Past Medical History  Diagnosis Date  . Allergy   . GERD (gastroesophageal reflux disease)   . MS (multiple sclerosis) (HCC)   . Hypertension     Medications:  Scheduled:  . amLODipine  5 mg Oral Daily  . atorvastatin  40 mg Oral Daily  . cefTRIAXone (ROCEPHIN)  IV  2 g Intravenous Q24H  .  cholecalciferol  1,000 Units Oral Daily  . ciprofloxacin  500 mg Oral BID  . docusate sodium  100 mg Oral BID  . feeding supplement (ENSURE ENLIVE)  237 mL Oral BID WC  . FLUoxetine  20 mg Oral Daily  . gabapentin  900 mg Oral TID  . heparin  5,000 Units Subcutaneous 3 times per day  . lisinopril  10 mg Oral Daily  . lisinopril  10 mg Oral Daily  . metoprolol succinate  25 mg Oral Daily  . pantoprazole  40 mg Oral Daily  . saccharomyces boulardii  250 mg Oral BID  . sodium chloride  3 mL Intravenous Q12H  . vancomycin  1,000 mg Intravenous Once  . vancomycin  1,000 mg Intravenous Once  . [START ON 11/04/2014] vancomycin  1,000 mg Intravenous Q12H   Assessment: Pharmacy consulted to dose Vancomycin in a 54 yo male admitted for possible sepsis.  Patient with dried pus around Foley catheter and dried feces on his lower extremities.  Patient with MS and paraplegia.   SCr: 1.3, est CrCl~72.7 mL/min, ke: 0.065, t1/2: 10.7 h, Vd: 57.9 L  Goal of Therapy:  Vancomycin trough level 15-20 mcg/ml  Plan:  Based on kinetic parameters, will order patient Vancomycin 1 gm IV once with a stacked dose of 1 gm 6 hours later.  Will then start maintenance dosing of Vancomycin 1 gm IV q12h.  Kinetics altered in patient's with paraplegia due  to muscle mass and CrCl calculation may not be as accurate.  Will check trough prior to 4th dose on 10/12 at 0230 (close to steady state).   Continue to follow cultures. Pharmacy will continue to follow.  Enis Leatherwood G 11/03/2014,8:32 AM

## 2014-11-03 NOTE — H&P (Signed)
Travis Cantrell is an 54 y.o. male.   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 In the emergency department the patient was found to have crusting which appeared to be dried pus around his Foley catheter which was changed 2 weeks ago. He also had dried feces on his lower extremities. He has a caregiver who is also in poor health. Though the patient appeared in no apparent distress laboratory evaluation revealed significant leukocytosis.  His Foley catheter was changed and the patient cleaned. Given his chronic invalid status and multiple medical problems emergency department staff initiated septic protocol and call for admission.  Past Medical History  Diagnosis Date  . Allergy   . GERD (gastroesophageal reflux disease)   . MS (multiple sclerosis) ([place])   . Hypertension     Past Surgical History  Procedure Laterality Date  . Vein reconsruction    . Rotator cuff repair      Family History  Problem Relation Age of Onset  . Cancer Mother     multiple myeloma  . Diabetes Father   . Heart attack Father    Social History:  reports that he has never smoked. He has never used smokeless tobacco. He reports that he does not drink alcohol or use illicit drugs.  Allergies:  Allergies  Allergen Reactions  . Baclofen Diarrhea    Prior to Admission medications   Medication Sig Start Date End Date Taking? Authorizing Provider  acetaminophen (TYLENOL) 325 MG tablet Take 2 tablets (650 mg total) by mouth every 6 (six) hours as needed for mild pain (or Fever >/= 101). 08/04/14   Nicholes Mango, MD  amLODipine (NORVASC) 5 MG tablet Take 1 tablet (5 mg total) by mouth daily. 06/18/14   Nicholes Mango, MD  atorvastatin (LIPITOR) 40 MG tablet Take 1 tablet (40 mg total) by mouth daily. 07/02/14   Amy Cletis Athens, MD  Cholecalciferol (VITAMIN D-3 PO) Take 1  tablet by mouth daily.     Historical Provider, MD  ciprofloxacin (CIPRO) 500 MG tablet Take 1 tablet (500 mg total) by mouth 2 (two) times daily. 08/04/14   Nicholes Mango, MD  diazepam (VALIUM) 5 MG tablet TAKE ONE TABLET BY MOUTH EVERY 12 HOURS AS NEEDED 10/28/14   Amy Cletis Athens, MD  docusate sodium (COLACE) 100 MG capsule Take 1 capsule (100 mg total) by mouth 2 (two) times daily. 06/18/14   Nicholes Mango, MD  feeding supplement, ENSURE ENLIVE, (ENSURE ENLIVE) LIQD Take 237 mLs by mouth 2 (two) times daily with a meal. 08/04/14   Nicholes Mango, MD  FLUoxetine (PROZAC) 20 MG capsule TAKE TWO CAPSULES BY MOUTH IN THE MORNING AND ONE IN THE EVENING 10/27/14   Amy E Bedsole, MD  gabapentin (NEURONTIN) 300 MG capsule TAKE THREE CAPSULES BY MOUTH THREE TIMES DAILY 10/16/14   Amy Cletis Athens, MD  ibuprofen (ADVIL,MOTRIN) 800 MG tablet TAKE ONE TABLET BY MOUTH EVERY 8 HOURS AS NEEDED Patient taking differently: TAKE ONE TABLET BY MOUTH EVERY 8 HOURS AS NEEDED FOR PAIN 02/07/14   Amy Cletis Athens, MD  ibuprofen (ADVIL,MOTRIN) 800 MG tablet TAKE ONE TABLET BY MOUTH EVERY 8 HOURS AS NEEDED 10/28/14   Amy E Diona Browner, MD  lisinopril (PRINIVIL,ZESTRIL) 10 MG tablet TAKE ONE TABLET BY MOUTH ONCE DAILY 06/18/14   Amy Cletis Athens, MD  lisinopril (PRINIVIL,ZESTRIL) 10 MG tablet TAKE  ONE TABLET BY MOUTH ONCE DAILY 09/03/14   Amy Cletis Athens, MD  metoprolol succinate (TOPROL-XL) 25 MG 24 hr tablet Take 25 mg by mouth daily.  07/16/14   Historical Provider, MD  omeprazole (PRILOSEC) 20 MG capsule TAKE ONE CAPSULE BY MOUTH TWICE DAILY 09/25/14   Amy Cletis Athens, MD  oxybutynin (DITROPAN) 5 MG tablet Take 1 tablet (5 mg total) by mouth every 8 (eight) hours as needed for bladder spasms. 10/17/14   Amy Cletis Athens, MD  saccharomyces boulardii (FLORASTOR) 250 MG capsule Take 1 capsule (250 mg total) by mouth 2 (two) times daily. 06/18/14   Nicholes Mango, MD  sulfamethoxazole-trimethoprim (BACTRIM DS,SEPTRA DS) 800-160 MG per tablet Take 1 tablet by mouth 2  (two) times daily. 08/04/14   Nicholes Mango, MD     Results for orders placed or performed during the hospital encounter of 11/03/14 (from the past 48 hour(s))  CBC with Differential     Status: Abnormal   Collection Time: 11/03/14  3:04 AM  Result Value Ref Range   WBC 27.2 (H) 3.8 - 10.6 K/uL   RBC 5.41 4.40 - 5.90 MIL/uL   Hemoglobin 14.3 13.0 - 18.0 g/dL   HCT 43.9 40.0 - 52.0 %   MCV 81.1 80.0 - 100.0 fL   MCH 26.4 26.0 - 34.0 pg   MCHC 32.6 32.0 - 36.0 g/dL   RDW 14.0 11.5 - 14.5 %   Platelets 305 150 - 440 K/uL   Neutrophils Relative % 91 %   Neutro Abs 24.8 (H) 1.4 - 6.5 K/uL   Lymphocytes Relative 4 %   Lymphs Abs 1.0 1.0 - 3.6 K/uL   Monocytes Relative 5 %   Monocytes Absolute 1.4 (H) 0.2 - 1.0 K/uL   Eosinophils Relative 0 %   Eosinophils Absolute 0.0 0 - 0.7 K/uL   Basophils Relative 0 %   Basophils Absolute 0.1 0 - 0.1 K/uL  Comprehensive metabolic panel     Status: Abnormal   Collection Time: 11/03/14  3:04 AM  Result Value Ref Range   Sodium 140 135 - 145 mmol/L   Potassium 4.2 3.5 - 5.1 mmol/L   Chloride 106 101 - 111 mmol/L   CO2 26 22 - 32 mmol/L   Glucose, Bld 178 (H) 65 - 99 mg/dL   BUN 25 (H) 6 - 20 mg/dL   Creatinine, Ser 1.36 (H) 0.61 - 1.24 mg/dL   Calcium 9.0 8.9 - 10.3 mg/dL   Total Protein 7.3 6.5 - 8.1 g/dL   Albumin 3.7 3.5 - 5.0 g/dL   AST 17 15 - 41 U/L   ALT 13 (L) 17 - 63 U/L   Alkaline Phosphatase 92 38 - 126 U/L   Total Bilirubin 1.0 0.3 - 1.2 mg/dL   GFR calc non Af Amer 58 (L) >60 mL/min   GFR calc Af Amer >60 >60 mL/min    Comment: (NOTE) The eGFR has been calculated using the CKD EPI equation. This calculation has not been validated in all clinical situations. eGFR's persistently <60 mL/min signify possible Chronic Kidney Disease.    Anion gap 8 5 - 15  Troponin I     Status: None   Collection Time: 11/03/14  3:04 AM  Result Value Ref Range   Troponin I <0.03 <0.031 ng/mL    Comment:        NO INDICATION OF MYOCARDIAL  INJURY.   Urinalysis complete, with microscopic ([facility] only)     Status: Abnormal   Collection  Time: 11/03/14  3:04 AM  Result Value Ref Range   Color, Urine YELLOW (A) YELLOW   APPearance CLOUDY (A) CLEAR   Glucose, UA NEGATIVE NEGATIVE mg/dL   Bilirubin Urine NEGATIVE NEGATIVE   Ketones, ur NEGATIVE NEGATIVE mg/dL   Specific Gravity, Urine 1.011 1.005 - 1.030   Hgb urine dipstick 3+ (A) NEGATIVE   pH 8.0 5.0 - 8.0   Protein, ur 100 (A) NEGATIVE mg/dL   Nitrite NEGATIVE NEGATIVE   Leukocytes, UA 3+ (A) NEGATIVE   RBC / HPF TOO NUMEROUS TO COUNT 0 - 5 RBC/hpf   WBC, UA TOO NUMEROUS TO COUNT 0 - 5 WBC/hpf   Bacteria, UA MANY (A) NONE SEEN   Squamous Epithelial / LPF NONE SEEN NONE SEEN   WBC Clumps PRESENT    Mucous PRESENT   Lactic acid, plasma     Status: None   Collection Time: 11/03/14  3:04 AM  Result Value Ref Range   Lactic Acid, Venous 1.7 0.5 - 2.0 mmol/L   Dg Chest Port 1 View  11/03/2014   CLINICAL DATA:  Initial evaluation for acute weakness.  EXAM: PORTABLE CHEST 1 VIEW  COMPARISON:  Prior radiograph from 08/01/2014.  FINDINGS: The cardiac and mediastinal silhouettes are stable in size and contour, and remain within normal limits.  The lungs are normally inflated. No airspace consolidation, pleural effusion, or pulmonary edema is identified. There is no pneumothorax.  No acute osseous abnormality identified.  IMPRESSION: No active disease.   Electronically Signed   By: Jeannine Boga M.D.   On: 11/03/2014 03:27    Review of Systems  Constitutional: Negative for fever and chills.  HENT: Negative for sore throat and tinnitus.   Eyes: Negative for blurred vision and redness.  Respiratory: Negative for cough and shortness of breath.   Cardiovascular: Negative for chest pain, palpitations, orthopnea and PND.  Gastrointestinal: Negative for nausea, vomiting, abdominal pain and diarrhea.  Genitourinary: Negative for dysuria, urgency and frequency.  Musculoskeletal:  Negative for myalgias and joint pain.  Skin: Negative for rash.       No lesions  Neurological: Negative for speech change, focal weakness and weakness.  Endo/Heme/Allergies: Does not bruise/bleed easily.       No temperature intolerance  Psychiatric/Behavioral: Negative for depression and suicidal ideas.    Blood pressure 131/76, pulse 78, temperature 101.2 F (38.4 C), temperature source Rectal, resp. rate 20, weight 97.523 kg (215 lb), SpO2 99 %. Physical Exam  Nursing note and vitals reviewed. Constitutional: He is oriented to person, place, and time. He appears well-developed and well-nourished. No distress.  HENT:  Head: Normocephalic and atraumatic.  Mouth/Throat: Oropharynx is clear and moist. No oropharyngeal exudate.  Eyes: Conjunctivae and EOM are normal. Pupils are equal, round, and reactive to light. No scleral icterus.  Neck: Normal range of motion. Neck supple. No JVD present. No tracheal deviation present. No thyromegaly present.  Cardiovascular: Normal rate, regular rhythm and normal heart sounds.  Exam reveals no gallop and no friction rub.   No murmur heard. Respiratory: Effort normal and breath sounds normal. No respiratory distress.  GI: Soft. Bowel sounds are normal. He exhibits no distension. There is no tenderness.  Genitourinary:  Foley in place  Musculoskeletal: He exhibits edema.  Paraplegic; sensory level is waist  Lymphadenopathy:    He has no cervical adenopathy.  Neurological: He is alert and oriented to person, place, and time. No cranial nerve deficit.  Skin: Skin is warm and dry. No rash noted.  There is erythema (Likely chronic venous stasis changes).  Psychiatric: His behavior is normal. Judgment and thought content normal.  Affect slightly flat     Assessment/Plan  this is a 54 year old Caucasian male with multiple sclerosis admitted for sepsis due to urinary tract infection. 1. Urinary tract infection: The patient had been on prophylactic  Bactrim. I have held his oral antibiotics. He's been given ceftriaxone in the emergency department. I added vancomycin and the patient has received 2 L of normal saline per sepsis protocol. Continue oxybutynin. 2. Sepsis: The patient is grossly via leukocytosis and fever. He is hemodynamically stable. Blood cultures and urine cultures have been obtained and we will follow for growth and sensitivities. 3. Hypertension: Patient is normotensive thus we will continue his home regimen of amlodipine, metoprolol and lisinopril. 4. Multiple sclerosis: Continue Valium as needed for muscle spasms  5. Depression: Continue fluoxetine 6. DVT prophylaxis: Heparin 7. GI prophylaxis: Continue PPI per home regimen due to had bound status The patient is a full code. Tonsillar admission orders and patient care possibly 35 minutes   Harrie Foreman 11/03/2014, 5:35 AM
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
## 2014-11-03 NOTE — Progress Notes (Signed)
[facility] - [place] [facility]   PATIENT NAME: Travis Cantrell    MR#:  [id number]  DATE OF BIRTH:  September 21, 1960  SUBJECTIVE:  CHIEF COMPLAINT:   Chief Complaint  Patient presents with  . Weakness   No new complaints. No pain. No dysuria. Does note that he had fever prior to coming in. No nausea or vomiting.  REVIEW OF SYSTEMS:   Review of Systems  Constitutional: Negative for fever.  Respiratory: Negative for shortness of breath.   Cardiovascular: Negative for chest pain and palpitations.  Gastrointestinal: Negative for nausea, vomiting and abdominal pain.  Genitourinary: Negative for dysuria.    DRUG ALLERGIES:   Allergies  Allergen Reactions  . Baclofen Diarrhea    VITALS:  Blood pressure 112/76, pulse 62, temperature 97.7 F (36.5 C), temperature source Oral, resp. rate 16, weight 97.523 kg (215 lb), SpO2 100 %.  PHYSICAL EXAMINATION:  GENERAL:  54 y.o.-year-old patient lying in the bed with no acute distress.  LUNGS: Normal breath sounds bilaterally, no wheezing, rales,rhonchi or crepitation. No use of accessory muscles of respiration.  CARDIOVASCULAR: S1, S2 normal. No murmurs, rubs, or gallops.  ABDOMEN: Soft, nontender, nondistended. Bowel sounds present. No organomegaly or mass.  EXTREMITIES: No pedal edema, cyanosis, or clubbing.  NEUROLOGIC: Cranial nerves II through XII are intact. No strength in bilateral lower extremities, contractures, strength is 4 out of 5 in the left upper arm, 3 out of 5 in the right  PSYCHIATRIC: The patient is alert and oriented x 3.  SKIN: Skin grafts over bilateral legs, abrasion over the left shin, some irritation of the left thigh, small ulceration of penis without exudate   LABORATORY PANEL:   CBC  Recent Labs Lab 11/03/14 0304  WBC 27.2*  HGB 14.3  HCT 43.9  PLT 305    ------------------------------------------------------------------------------------------------------------------  Chemistries   Recent Labs Lab 11/03/14 0304  NA 140  K 4.2  CL 106  CO2 26  GLUCOSE 178*  BUN 25*  CREATININE 1.36*  CALCIUM 9.0  AST 17  ALT 13*  ALKPHOS 92  BILITOT 1.0   ------------------------------------------------------------------------------------------------------------------  Cardiac Enzymes  Recent Labs Lab 11/03/14 0304  TROPONINI <0.03   ------------------------------------------------------------------------------------------------------------------  RADIOLOGY:  Dg Chest Port 1 View  11/03/2014   CLINICAL DATA:  Initial evaluation for acute weakness.  EXAM: PORTABLE CHEST 1 VIEW  COMPARISON:  Prior radiograph from 08/01/2014.  FINDINGS: The cardiac and mediastinal silhouettes are stable in size and contour, and remain within normal limits.  The lungs are normally inflated. No airspace consolidation, pleural effusion, or pulmonary edema is identified. There is no pneumothorax.  No acute osseous abnormality identified.  IMPRESSION: No active disease.   Electronically Signed   By: Rise Mu M.D.   On: 11/03/2014 03:27    EKG:   Orders placed or performed during the hospital encounter of 11/03/14  . ED EKG  . ED EKG  . EKG 12-Lead  . EKG 12-Lead    ASSESSMENT AND PLAN:   Sepsis - Due to urinary tract infection, urine and blood cultures are pending  Urinary tract infection - Has chronic indwelling Foley catheter which has been changed - Had been on prophylactic Bactrim - Cultures pending - Continue Rocephin and vancomycin  Hypertension - Controlled, continue home medications including amlodipine, metoprolol, lisinopril  Multiple sclerosis - Stable, continue Valium as needed for muscle spasm - reevaluate care at home, did benefit from home health services  Penile ulcer - check RPR, HIV,  CODE STATUS:  full  TOTAL TIME TAKING CARE OF THIS PATIENT: 40 minutes.  Greater than 50% of time spent in care coordination and counseling. Care plan discussed with patient at bedside POSSIBLE D/C IN 1-2 DAYS, DEPENDING ON CLINICAL CONDITION.   Elby Showers M.D on 11/03/2014 at 2:43 PM  Between 7am to 6pm - Pager - [phone]  After 6pm go to [email or web address] - password [email or web address] [facility]  [place] [facility]  Office  [phone]  CC: Primary care physician; Kerby Nora, MD

## 2014-11-03 NOTE — Care Management (Signed)
This RNCM attempted to assess this patient but he could not stay awake. He is currently on O2. He came from home soiled with old stool stuck to him. He has chronic foley cath according to CMA note at his providers office. Has not seen PCP since June 2016 probably because of ambulatory status related to MS. He uses [facility] [address] for Rx. RNCM to continue to follow. Josie CSW notified.

## 2014-11-03 NOTE — Progress Notes (Signed)
Trailer with handicap ramp

## 2014-11-03 NOTE — ED Notes (Signed)
Pt arrives via EMS from home.  Pt with hx of MS with chronic indwellilng foley.  Pt c/o generalized weakness for the last couple of days. Pt with several sheets that are saturated with urine.  Pt incontinent of stool that has dried to patients buttocks and lower back.  Pt warm to touch.

## 2014-11-03 NOTE — Progress Notes (Signed)
New admit to rm 146,  patient with MS  decreased sensation from chest   to lower extremities. Chronic foley catheter . Bilateral foot drop. patient alert and oriented..incontinent.

## 2014-11-03 NOTE — Progress Notes (Addendum)
Initial Nutrition Assessment   INTERVENTION:   Meals and Snacks: Cater to patient preferences Medical Food Supplement Therapy: will recommend Ensure Enlive po TID, each supplement provides 350 kcal and 20 grams of protein   NUTRITION DIAGNOSIS:   Increased nutrient needs related to wound healing as evidenced by estimated needs.  GOAL:   Patient will meet greater than or equal to 90% of their needs  MONITOR:    (Energy Intake, Skin, Anthropometrics, Digestive System)  REASON FOR ASSESSMENT:    (pressure ulcer)    ASSESSMENT:   Pt admitted with weakness, sepsis secondary to UTI. Pt with h/o MS and paraplegia.   Past Medical History  Diagnosis Date  . Allergy   . GERD (gastroesophageal reflux disease)   . MS (multiple sclerosis) (HCC)   . Hypertension     Diet Order:  Diet Heart Room service appropriate?: Yes; Fluid consistency:: Thin    Current Nutrition: Pt reports eating 100% of breakfast this morning but not wanting to eat lunch as he was full from breakfast.   Food/Nutrition-Related History: Pt reports usually eating 2 meals per day. Pt reports liking Ensure and drinking up to 2-3 a day however  Reports case at home is almost empty and therefore has not been drinking as many. RD notes caretaker has been in poor health as well and noted condition of pt on admission.    Medications: vitamin D, colace, protonix, NS at 111mL/hr  Electrolyte/Renal Profile and Glucose Profile:   Recent Labs Lab 11/03/14 0304  NA 140  K 4.2  CL 106  CO2 26  BUN 25*  CREATININE 1.36*  CALCIUM 9.0  GLUCOSE 178*   Protein Profile:   Recent Labs Lab 11/03/14 0304  ALBUMIN 3.7    Gastrointestinal Profile: Last BM:  11/03/2014   Nutrition-Focused Physical Exam Findings: Nutrition-Focused physical exam completed. Findings are no fat depletion, no muscle depletion, and no edema, however difficult to assess lower extremities as pt with paraplegia and  non-ambulatory.    Weight Change: Pt reports stable weight around 215lbs. RD measured pt in bed on visit at 199.5lbs. RD also notes 215lbs documented from 2009-2011 per [facility].   Skin:  Per RN Kellie coccyx pressure ulcer stage II. 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   Height:   Ht Readings from Last 1 Encounters:  08/01/14 5\' 10"  (1.778 m)    Weight:   Wt Readings from Last 1 Encounters:  11/03/14 199 lb 8 oz (90.493 kg)    Wt Readings from Last 10 Encounters:  11/03/14 199 lb 8 oz (90.493 kg)  08/01/14 225 lb (102.059 kg)  06/18/14 174 lb 3.2 oz (79.017 kg)  06/11/13 250 lb (113.399 kg)  12/08/09 215 lb (97.523 kg)  09/25/09 215 lb (97.523 kg)  04/29/08 215 lb (97.523 kg)  08/29/07 215 lb (97.523 kg)    Ideal Body Weight:   70kg  BMI:  Body mass index is 28.63 kg/(m^2).    Estimated Nutritional Needs:   Kcal:  using IBW of 70kg, BEE: 1541kcals, TEE: (IF 1.1-1.3)(AF 1.2) [id number]  Protein:  84-105g protein (1.2-1.5g/kg)   Fluid:  1750-2185mL of fluid (25-46mL/kg)    EDUCATION NEEDS:   Education needs no appropriate at this time   HIGH Care Level  Leda Quail, RD, LDN Pager [phone]

## 2014-11-04 LAB — BASIC METABOLIC PANEL
ANION GAP: 5 (ref 5–15)
BUN: 18 mg/dL (ref 6–20)
CALCIUM: 8.1 mg/dL — AB (ref 8.9–10.3)
CO2: 25 mmol/L (ref 22–32)
Chloride: 108 mmol/L (ref 101–111)
Creatinine, Ser: 0.97 mg/dL (ref 0.61–1.24)
GFR calc non Af Amer: >60 mL/min (ref >60)
Glucose, Bld: 93 mg/dL (ref 65–99)
Potassium: 3.8 mmol/L (ref 3.5–5.1)
Sodium: 138 mmol/L (ref 135–145)

## 2014-11-04 LAB — MRSA PCR SCREENING: MRSA BY PCR: NEGATIVE

## 2014-11-04 LAB — RPR: RPR Ser Ql: NONREACTIVE

## 2014-11-04 LAB — CBC
HEMATOCRIT: 36.4 % — AB (ref 40.0–52.0)
HEMOGLOBIN: 11.6 g/dL — AB (ref 13.0–18.0)
MCH: 26.1 pg (ref 26.0–34.0)
MCHC: 31.9 g/dL — ABNORMAL LOW (ref 32.0–36.0)
MCV: 81.7 fL (ref 80.0–100.0)
Platelets: 204 10*3/uL (ref 150–440)
RBC: 4.46 MIL/uL (ref 4.40–5.90)
RDW: 13.9 % (ref 11.5–14.5)
WBC: 14 10*3/uL — AB (ref 3.8–10.6)

## 2014-11-04 LAB — HIV ANTIBODY (ROUTINE TESTING W REFLEX): HIV SCREEN 4TH GENERATION: NONREACTIVE

## 2014-11-04 MED ORDER — VANCOMYCIN HCL IN DEXTROSE 1-5 GM/200ML-% IV SOLN
1000.0000 mg | Freq: Two times a day (BID) | INTRAVENOUS | Status: DC
  Administered 2014-11-04 – 2014-11-05 (×2): 1000 mg via INTRAVENOUS
  Filled 2014-11-04 (×2): qty 200

## 2014-11-04 MED ORDER — VANCOMYCIN HCL IN DEXTROSE 1-5 GM/200ML-% IV SOLN
1000.0000 mg | Freq: Three times a day (TID) | INTRAVENOUS | Status: DC
  Filled 2014-11-04 (×2): qty 200

## 2014-11-04 NOTE — Clinical Social Work Note (Signed)
Clinical Social Work Assessment  Patient Details  Name: Travis Cantrell MRN: [id number] Date of Birth: April 14, 1960  Date of referral:  11/04/14               Reason for consult:  Facility Placement, [employer]                Permission sought to share information with:    Permission granted to share information::  No  Name::        Agency::     Relationship::     Contact Information:     Housing/Transportation Living arrangements for the past 2 months:  Single Family Home Source of Information:  Patient Patient Interpreter Needed:  None Criminal Activity/Legal Involvement Pertinent to Current Situation/Hospitalization:  No - Comment as needed Significant Relationships:  Adult Children, Siblings, Other(Comment) (Roommate ) Lives with:  Roommate Do you feel safe going back to the place where you live?  Yes Need for family participation in patient care:  Yes (Comment)  Care giving concerns:  Patient lives with his roommate Travis Cantrell in [place], [place].    Social Worker assessment / plan: Holiday representative (CSW) received verbal consult from RN Case Manager that patient may be interested in placement. CSW met with patient alone at bedside. Patient was alert and oriented and sitting up in the bed. CSW introduced self and explained role of CSW department. Patient reported that he lives with his friend/ roommate Travis Cantrell [address] [employer] in [place]. Patient reported that he has several sisters in [place] and [place] that visit him on a regular basis. Patient also has a daughter Travis Cantrell that lives in [place]. Per patient his daughter also visits often. Patient reported that he has a wheel chair van and wheel chair ramp at the house. Patient reported that he has access to his food and medication. CSW discussed SNF placement. Patient reported that right now he is going home and made consider SNF down the road. CSW provided SNF and ALF list. CSW also discussed long term care and  Medicaid. CSW provided patient with Vance Gather' phone number to assist with Medicaid application.   Employment status:  Disabled (Comment on whether or not currently receiving Disability) Insurance information:  Managed Medicare PT Recommendations:  Not assessed at this time Information / Referral to community resources:  Other (Comment Required) Librarian, academic )  Patient/Family's Response to care:  Patient is agreeable to going home.   Patient/Family's Understanding of and Emotional Response to Diagnosis, Current Treatment, and Prognosis:  Patient was pleasant and thanked CSW for visit.   Emotional Assessment Appearance:  Appears stated age Attitude/Demeanor/Rapport:    Affect (typically observed):  Accepting, Adaptable, Pleasant Orientation:  Oriented to Self, Oriented to Place, Oriented to  Time, Oriented to Situation Alcohol / Substance use:  Not Applicable Psych involvement (Current and /or in the community):  No (Comment)  Discharge Needs  Concerns to be addressed:  No discharge needs identified Readmission within the last 30 days:  No Current discharge risk:  None Barriers to Discharge:  Continued Medical Work up   Travis Freshwater, LCSW 11/04/2014, 5:03 PM

## 2014-11-04 NOTE — Clinical Documentation Improvement (Signed)
Hospitalist  A cause and effect relationship may not be assumed and must be documented by a provider.  Please clarify the relationship, if any, between *sepsis*, *UTI* and *Chronic foley*.  Are the conditions:   Due to or associated with each other  Unrelated to each other  Other  Clinically Undetermined   Supporting Information (risk factors, sign and symptoms, diagnostics, treatment): *Per MD Progress Note 10/10: "Sepsis - Due to urinary tract infection And *Urinary tract infection - Has chronic indwelling Foley catheter which has been changed"*   Please exercise your independent, professional judgment when responding. A specific answer is not anticipated or expected.   Thank You,  Cruzita Lederer Health Information Management [place] [phone]

## 2014-11-04 NOTE — Care Management Note (Signed)
Case Management Note  Patient Details  Name: Travis Cantrell MRN: [id number] Date of Birth: 1960-07-22  Subjective/Objective:                  Met with patient who is alert, awake, and oriented. He states that he is open to home health "hospice/[facility] is wrote on their vehicles". He is bedbound mostly but able to ride in a motorized wheelchair and has a manual wheelchair in the home. He has a handicap Lucianne Lei but it is in the shop currently being worked on. He pays a man by the name Travis Cantrell [phone] to care for him- Travis Cantrell lives with him. Patient states that "he helps me bathe about once a week". Per nursing patient was really soiled when he came to [facility] and didn't look like he'd been cared for. I spoke with patient about this concern and he states that Travis Cantrell "is around 54 years old". Patient did mention his appreciation of the care he is getting at [facility]. I asked him if he was interested in going to a facility- he states that "I'm not ready for that but when [name] dies I will do it then". He is willing to talk to [place] about placement and [name] CSW will meet with patient. He is new to O2. He has chronic Foley. He will need EMS to home.  Action/Plan:  Message left for [name] at [facility] [phone] fax [phone] to see if they are following him. RNCM will continue to follow. I attempted to call Travis Cantrell but number was busy. Dietician said that patient is having problems obtaining Ensure. I am hoping that [facility] can assist with that information.   Expected Discharge Date:                  Expected Discharge Plan:     In-House Referral:  Clinical Social Work  Discharge planning Services  CM Consult  Post Acute Care Choice:  Home Health Choice offered to:  Patient  DME Arranged:    DME Agency:     HH Arranged:    [place] Agency:     Status of Service:  In process, will continue to follow  Medicare Important Message Given:    Date Medicare IM Given:    Medicare IM give by:     Date Additional Medicare IM Given:    Additional Medicare Important Message give by:     If discussed at [place] of Stay Meetings, dates discussed:    Additional Comments:  [name], RN 11/04/2014, 12:12 PM

## 2014-11-04 NOTE — Progress Notes (Signed)
[facility] Physicians - [place] at [facility]   PATIENT NAME: Travis Cantrell    MR#:  [id number]  DATE OF BIRTH:  Aug 18, 1960  SUBJECTIVE:  CHIEF COMPLAINT:   Chief Complaint  Patient presents with  . Weakness   Feeling well. No new complaints.  REVIEW OF SYSTEMS:   Review of Systems  Constitutional: Negative for fever.  Respiratory: Negative for shortness of breath.   Cardiovascular: Negative for chest pain and palpitations.  Gastrointestinal: Negative for nausea, vomiting and abdominal pain.  Genitourinary: Negative for dysuria.    DRUG ALLERGIES:   Allergies  Allergen Reactions  . Baclofen Diarrhea    VITALS:  Blood pressure 157/89, pulse 81, temperature 98.2 F (36.8 C), temperature source Oral, resp. rate 18, height  (1.778 m), weight 91.989 kg (202 lb 12.8 oz), SpO2 100 %.  PHYSICAL EXAMINATION:  GENERAL:  54 y.o.-year-old patient lying in the bed with no acute distress.  LUNGS: Normal breath sounds bilaterally, no wheezing, rales,rhonchi or crepitation. No use of accessory muscles of respiration.  CARDIOVASCULAR: S1, S2 normal. No murmurs, rubs, or gallops.  ABDOMEN: Soft, nontender, nondistended. Bowel sounds present. No organomegaly or mass.  EXTREMITIES: No pedal edema, cyanosis, or clubbing.  NEUROLOGIC: Cranial nerves II through XII are intact. No strength in bilateral lower extremities, contractures, strength is 4 out of 5 in the left upper arm, 3 out of 5 in the right  PSYCHIATRIC: The patient is alert and oriented x 3.  SKIN: Skin grafts over bilateral legs, abrasion over the left shin, some irritation of the left thigh, small ulceration of penis without exudate   LABORATORY PANEL:   CBC  Recent Labs Lab 11/04/14 0747  WBC 14.0*  HGB 11.6*  HCT 36.4*  PLT 204   ------------------------------------------------------------------------------------------------------------------  Chemistries   Recent Labs Lab 11/03/14 0304  11/04/14 0726  NA 140 138  K 4.2 3.8  CL 106 108  CO2 26 25  GLUCOSE 178* 93  BUN 25* 18  CREATININE 1.36* 0.97  CALCIUM 9.0 8.1*  AST 17  --   ALT 13*  --   ALKPHOS 92  --   BILITOT 1.0  --    ------------------------------------------------------------------------------------------------------------------  Cardiac Enzymes  Recent Labs Lab 11/03/14 0304  TROPONINI <0.03   ------------------------------------------------------------------------------------------------------------------  RADIOLOGY:  Dg Chest Port 1 View  11/03/2014   CLINICAL DATA:  Initial evaluation for acute weakness.  EXAM: PORTABLE CHEST 1 VIEW  COMPARISON:  Prior radiograph from 08/01/2014.  FINDINGS: The cardiac and mediastinal silhouettes are stable in size and contour, and remain within normal limits.  The lungs are normally inflated. No airspace consolidation, pleural effusion, or pulmonary edema is identified. There is no pneumothorax.  No acute osseous abnormality identified.  IMPRESSION: No active disease.   Electronically Signed   By: Rise Mu M.D.   On: 11/03/2014 03:27    EKG:   Orders placed or performed during the hospital encounter of 11/03/14  . ED EKG  . ED EKG  . EKG 12-Lead  . EKG 12-Lead    ASSESSMENT AND PLAN:   Sepsis - Due to urinary tract infection, urine and blood cultures are pending - leucocytosis, lactic acidosis, fever improving  Urinary tract infection - Has chronic indwelling Foley catheter which has been changed, and will change again to larger size due to leaking - Had been on prophylactic Bactrim at home - Cultures pending - Continue Rocephin and vancomycin  Hypertension - Controlled, continue home medications including amlodipine, metoprolol, lisinopril  Multiple sclerosis - Stable, continue Valium as needed for muscle spasm - reevaluate care at home, would benefit from home health services  Penile ulcer -  RPR, HIV both negative  -  appreciate wound care consultation, likely due to catheter and urine leakage  CODE STATUS: full  TOTAL TIME TAKING CARE OF THIS PATIENT: 30 minutes.  Greater than 50% of time spent in care coordination and counseling. Care plan discussed with patient at bedside POSSIBLE D/C IN 1-2 DAYS, DEPENDING ON CLINICAL CONDITION.   Elby Showers M.D on 11/04/2014 at 2:19 PM  Between 7am to 6pm - Pager - [phone]  After 6pm go to [email or web address] - password [email or web address] [facility]  [place] [facility] [facility]  Office  [phone]  CC: Primary care physician; Kerby Nora, MD

## 2014-11-04 NOTE — Consult Note (Signed)
WOC wound consult note Reason for Consult: Incontinence Associated Dermatitis (IAD) and breakdown to buttocks.  Device related pressure injury from foley catheter to penis.  History of skin grafting to bilateral lower legs, intact.  1 cm x 1 cm abrasion noted to left shin.   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 Pressure Ulcer POA: Yes Measurement: generalized redness to buttocks.  Will add barrier cream and prompt incontinence care Wound bed: moist pink Drainage (amount, consistency, odor) Scant serous Periwound:Intact Dressing procedure/placement/frequency:Keep perineal skin clean and dry.  Ensure foley anchoring device is in place.  Keep legs moisturized.  Barrier cream to perineal area twice daily and PRN soilage.  Will not follow at this time.  Please re-consult if needed.  Maple Hudson RN BSN CWON Pager [phone]

## 2014-11-05 DIAGNOSIS — Z888 Allergy status to other drugs, medicaments and biological substances status: Secondary | ICD-10-CM | POA: Diagnosis not present

## 2014-11-05 DIAGNOSIS — G35 Multiple sclerosis: Secondary | ICD-10-CM | POA: Diagnosis not present

## 2014-11-05 DIAGNOSIS — I10 Essential (primary) hypertension: Secondary | ICD-10-CM | POA: Diagnosis not present

## 2014-11-05 DIAGNOSIS — F329 Major depressive disorder, single episode, unspecified: Secondary | ICD-10-CM | POA: Diagnosis not present

## 2014-11-05 DIAGNOSIS — Z807 Family history of other malignant neoplasms of lymphoid, hematopoietic and related tissues: Secondary | ICD-10-CM | POA: Diagnosis not present

## 2014-11-05 DIAGNOSIS — N179 Acute kidney failure, unspecified: Secondary | ICD-10-CM | POA: Diagnosis not present

## 2014-11-05 DIAGNOSIS — K219 Gastro-esophageal reflux disease without esophagitis: Secondary | ICD-10-CM | POA: Diagnosis not present

## 2014-11-05 DIAGNOSIS — Z79899 Other long term (current) drug therapy: Secondary | ICD-10-CM | POA: Diagnosis not present

## 2014-11-05 DIAGNOSIS — T83511A Infection and inflammatory reaction due to indwelling urethral catheter, initial encounter: Secondary | ICD-10-CM | POA: Diagnosis not present

## 2014-11-05 DIAGNOSIS — G8220 Paraplegia, unspecified: Secondary | ICD-10-CM | POA: Diagnosis not present

## 2014-11-05 DIAGNOSIS — N3001 Acute cystitis with hematuria: Secondary | ICD-10-CM | POA: Diagnosis not present

## 2014-11-05 DIAGNOSIS — Z8249 Family history of ischemic heart disease and other diseases of the circulatory system: Secondary | ICD-10-CM | POA: Diagnosis not present

## 2014-11-05 DIAGNOSIS — A419 Sepsis, unspecified organism: Secondary | ICD-10-CM | POA: Diagnosis not present

## 2014-11-05 DIAGNOSIS — Z833 Family history of diabetes mellitus: Secondary | ICD-10-CM | POA: Diagnosis not present

## 2014-11-05 DIAGNOSIS — E785 Hyperlipidemia, unspecified: Secondary | ICD-10-CM | POA: Diagnosis not present

## 2014-11-05 LAB — CBC
HEMATOCRIT: 35.1 % — AB (ref 40.0–52.0)
Hemoglobin: 11.3 g/dL — ABNORMAL LOW (ref 13.0–18.0)
MCH: 26.2 pg (ref 26.0–34.0)
MCHC: 32.2 g/dL (ref 32.0–36.0)
MCV: 81.4 fL (ref 80.0–100.0)
Platelets: 225 10*3/uL (ref 150–440)
RBC: 4.31 MIL/uL — ABNORMAL LOW (ref 4.40–5.90)
RDW: 14 % (ref 11.5–14.5)
WBC: 11.4 10*3/uL — ABNORMAL HIGH (ref 3.8–10.6)

## 2014-11-05 LAB — URINE CULTURE: SPECIAL REQUESTS: NORMAL

## 2014-11-05 LAB — BASIC METABOLIC PANEL
Anion gap: 6 (ref 5–15)
BUN: 14 mg/dL (ref 6–20)
CHLORIDE: 106 mmol/L (ref 101–111)
CO2: 28 mmol/L (ref 22–32)
CREATININE: 0.87 mg/dL (ref 0.61–1.24)
Calcium: 8.6 mg/dL — ABNORMAL LOW (ref 8.9–10.3)
GFR calc Af Amer: >60 mL/min (ref >60)
GFR calc non Af Amer: >60 mL/min (ref >60)
Glucose, Bld: 96 mg/dL (ref 65–99)
Potassium: 3.7 mmol/L (ref 3.5–5.1)
SODIUM: 140 mmol/L (ref 135–145)

## 2014-11-05 LAB — VANCOMYCIN, TROUGH: Vancomycin Tr: 17 ug/mL (ref 10–20)

## 2014-11-05 MED ORDER — AMOXICILLIN-POT CLAVULANATE 875-125 MG PO TABS: 1.0000 | ORAL_TABLET | Freq: Two times a day (BID) | ORAL | Status: DC

## 2014-11-05 NOTE — Progress Notes (Signed)
Patient discharged home, instructions given to patient, verbalized understanding. EMS called to transport patient home. Waiting on transportation.

## 2014-11-05 NOTE — Care Management (Addendum)
Patient discharging home today by EMS- packet completed for EMS. I again contacted [facility] home health [phone] as they never returned my call. They referred me to [facility] [phone] and he is not theirs either. I contacted [facility] hosp[facility] and they are not open to this case either. Referral made to Selena Batten with [facility] home [facility] [phone]. I was meeting with patient again and his sister walked in. She said [facility] is following him. I have notified Barbara Cower with [facility] of patient discharge to home today. I have notified Selena Batten that patient will still need private duty assistance. I have left message for Will with [facility] to see how I can order Ensure for this patient and if Rx is needed. Ensure is not covered under insurance- no Rx needed. I have requested a case from [facility]. No further RNCM needs. Case closed.

## 2014-11-05 NOTE — Care Management Important Message (Signed)
Important Message  Patient Details  Name: Travis Cantrell MRN: [id number] Date of Birth: 1960/11/24   Medicare Important Message Given:  N/A - LOS <3 / Initial given by admissions    Collie Siad, RN 11/05/2014, 8:25 AM

## 2014-11-05 NOTE — Discharge Instructions (Signed)
Foley catheter need to be changed every three weeks °

## 2014-11-05 NOTE — Plan of Care (Signed)
Problem: Phase I Progression Outcomes Goal: Pain controlled with appropriate interventions Outcome: Completed/Met Date Met:  11/05/14 No complaints of pain Goal: Voiding-avoid urinary catheter unless indicated Outcome: Progressing Pt has chronic foley catheter

## 2014-11-05 NOTE — Progress Notes (Signed)
ANTIBIOTIC CONSULT NOTE - INITIAL  Pharmacy Consult for Vancomycin Indication: rule out sepsis  Allergies  Allergen Reactions  . Baclofen Diarrhea    Patient Measurements: Height:  (177.8 cm) Weight: 202 lb 12.8 oz (91.989 kg) IBW/kg (Calculated) : 73 Adjusted Body Weight: 82.8 kg  Vital Signs: Temp: 98.4 F (36.9 C) (10/11 2001) Temp Source: Oral (10/11 2001) BP: 164/82 mmHg (10/11 2003) Pulse Rate: 73 (10/11 2003) Intake/Output from previous day: 10/11 0701 - 10/12 0700 In: 570 [P.O.:420; IV Piggyback:150] Out: 2375 [Urine:2375] Intake/Output from this shift: Total I/O In: 150 [IV Piggyback:150] Out: 1000 [Urine:1000]  Labs:  Recent Labs  11/03/14 0304 11/04/14 0726 11/04/14 0747 11/05/14 0314  WBC 27.2*  --  14.0* 11.4*  HGB 14.3  --  11.6* 11.3*  PLT 305  --  204 225  CREATININE 1.36* 0.97  --  0.87   Estimated Creatinine Clearance: 110.7 mL/min (by C-G formula based on Cr of 0.87).  Recent Labs  11/05/14 0313  [facility] 17     Microbiology: Recent Results (from the past 720 hour(s))  Urine culture     Status: None (Preliminary result)   Collection Time: 11/03/14  3:04 AM  Result Value Ref Range Status   Specimen Description URINE, CATHETERIZED  Final   Special Requests Normal  Final   Culture   Final    >=100,000 COLONIES/mL PROTEUS MIRABILIS SUSCEPTIBILITIES TO FOLLOW    Report Status PENDING  Incomplete  Culture, blood (routine x 2)     Status: None (Preliminary result)   Collection Time: 11/03/14  3:34 AM  Result Value Ref Range Status   Specimen Description BLOOD LEFT HAND  Final   Special Requests BOTTLES DRAWN AEROBIC AND ANAEROBIC 4CC  Final   Culture NO GROWTH < 12 HOURS  Final   Report Status PENDING  Incomplete  Culture, blood (routine x 2)     Status: None (Preliminary result)   Collection Time: 11/03/14  3:34 AM  Result Value Ref Range Status   Specimen Description BLOOD LEFT ASSIST CONTROL  Final   Special Requests  BOTTLES DRAWN AEROBIC AND ANAEROBIC 5CC  Final   Culture NO GROWTH < 12 HOURS  Final   Report Status PENDING  Incomplete  MRSA PCR Screening     Status: None   Collection Time: 11/04/14  2:10 PM  Result Value Ref Range Status   MRSA by PCR NEGATIVE NEGATIVE Final    Comment:        The GeneXpert MRSA Assay (FDA approved for NASAL specimens only), is one component of a comprehensive MRSA colonization surveillance program. It is not intended to diagnose MRSA infection nor to guide or monitor treatment for MRSA infections.     Medical History: Past Medical History  Diagnosis Date  . Allergy   . GERD (gastroesophageal reflux disease)   . MS (multiple sclerosis) (HCC)   . Hypertension     Medications:  Scheduled:  . amLODipine  5 mg Oral Daily  . atorvastatin  40 mg Oral Daily  . cefTRIAXone (ROCEPHIN)  IV  2 g Intravenous Q24H  . cholecalciferol  1,000 Units Oral Daily  . docusate sodium  100 mg Oral BID  . enoxaparin (LOVENOX) injection  40 mg Subcutaneous Q24H  . feeding supplement (ENSURE ENLIVE)  237 mL Oral BID WC  . FLUoxetine  20 mg Oral Daily  . gabapentin  900 mg Oral TID  . Influenza vac split quadrivalent PF  0.5 mL Intramuscular Tomorrow-1000  .  lisinopril  10 mg Oral Daily  . metoprolol succinate  25 mg Oral Daily  . pantoprazole  40 mg Oral Daily  . saccharomyces boulardii  250 mg Oral BID  . sodium chloride  3 mL Intravenous Q12H  . vancomycin  1,000 mg Intravenous Q12H   Assessment: Pharmacy consulted to dose Vancomycin in a 54 yo male admitted for possible sepsis.  Patient with dried pus around Foley catheter and dried feces on his lower extremities.  Patient with MS and paraplegia.   SCr: 1.3, est CrCl~72.7 mL/min, ke: 0.065, t1/2: 10.7 h, Vd: 57.9 L  Goal of Therapy:  Vancomycin trough level 15-20 mcg/ml  Plan:  Based on kinetic parameters, will order patient Vancomycin 1 gm IV once with a stacked dose of 1 gm 6 hours later.  Will then start  maintenance dosing of Vancomycin 1 gm IV q12h.  Kinetics altered in patient's with paraplegia due to muscle mass and CrCl calculation may not be as accurate.  Will check trough prior to 4th dose on 10/12 at 0230 (close to steady state).   1012 0314 vancomycin trough therapeutic. Continue current dose, will follow renal function and recheck level in days.  Continue to follow cultures. Pharmacy will continue to follow.  Carola Frost, Pharm.D. Clinical Pharmacist 11/05/2014,4:56 AM

## 2014-11-05 NOTE — Discharge Summary (Signed)
[facility] - [place] at [facility]   PATIENT NAME: Travis Cantrell    MR#:  [id number]  DATE OF BIRTH:  1960/03/30  DATE OF ADMISSION:  11/03/2014 ADMITTING PHYSICIAN: Arnaldo Natal, MD  DATE OF DISCHARGE: 11/05/2014  PRIMARY CARE PHYSICIAN: Kerby Nora, MD    ADMISSION DIAGNOSIS:  UTI (lower urinary tract infection) [N39.0] SIRS (systemic inflammatory response syndrome) (HCC) [R65.10] Fever, unspecified fever cause [R50.9]  DISCHARGE DIAGNOSIS:  Active Problems:   Sepsis (HCC)   SECONDARY DIAGNOSIS:   Past Medical History  Diagnosis Date  . Allergy   . GERD (gastroesophageal reflux disease)   . MS (multiple sclerosis) (HCC)   . Hypertension     HOSPITAL COURSE:   1. Clinical sepsis, acute cystitis with hematuria secondary to indwelling Foley catheter. Catheter was changed during hospital course. I recommend changing Foley catheter every 3 weeks to prevent infections. Home health will be set up. Initially the patient was started on IV Rocephin and vancomycin. Proteus grew out of the urine culture and it was sensitive to the Rocephin. Looking through the sensitivities this will also be sensitive to Augmentin. Augmentin prescribed upon discharge for full course. 2. Acute kidney injury- improved during hospital course to a normal creatinine and GFR. 3. Essential hypertension- continue usual medication 4. History of multiple sclerosis. Patient is bedbound and limited mobility.- Continue usual medications. 5. Hyperlipidemia unspecified on a statin 6 gastroesophageal reflux disease without esophagitis continue PPI  DISCHARGE CONDITIONS:   Satisfactory  CONSULTS OBTAINED:  None  DRUG ALLERGIES:   Allergies  Allergen Reactions  . Baclofen Diarrhea    DISCHARGE MEDICATIONS:   Current Discharge Medication List    START taking these medications   Details  amoxicillin-clavulanate (AUGMENTIN) 875-125 MG tablet Take 1 tablet by mouth every  12 (twelve) hours. Qty: 14 tablet, Refills: 0      CONTINUE these medications which have NOT CHANGED   Details  acetaminophen (TYLENOL) 325 MG tablet Take 2 tablets (650 mg total) by mouth every 6 (six) hours as needed for mild pain (or Fever >/= 101).    amLODipine (NORVASC) 5 MG tablet Take 1 tablet (5 mg total) by mouth daily. Qty: 30 tablet, Refills: 0    atorvastatin (LIPITOR) 40 MG tablet Take 1 tablet (40 mg total) by mouth daily. Qty: 90 tablet, Refills: 1    Cholecalciferol (VITAMIN D-3 PO) Take 1 tablet by mouth daily.     diazepam (VALIUM) 5 MG tablet TAKE ONE TABLET BY MOUTH EVERY 12 HOURS AS NEEDED Qty: 30 tablet, Refills: 0    docusate sodium (COLACE) 100 MG capsule Take 1 capsule (100 mg total) by mouth 2 (two) times daily. Qty: 10 capsule, Refills: 0    feeding supplement, ENSURE ENLIVE, (ENSURE ENLIVE) LIQD Take 237 mLs by mouth 2 (two) times daily with a meal. Qty: 237 mL, Refills: 12    FLUoxetine (PROZAC) 20 MG capsule TAKE TWO CAPSULES BY MOUTH IN THE MORNING AND ONE IN THE EVENING Qty: 90 capsule, Refills: 5    gabapentin (NEURONTIN) 300 MG capsule TAKE THREE CAPSULES BY MOUTH THREE TIMES DAILY Qty: 360 capsule, Refills: 0    ibuprofen (ADVIL,MOTRIN) 800 MG tablet TAKE ONE TABLET BY MOUTH EVERY 8 HOURS AS NEEDED Qty: 90 tablet, Refills: 0    lisinopril (PRINIVIL,ZESTRIL) 10 MG tablet TAKE ONE TABLET BY MOUTH ONCE DAILY Qty: 30 tablet, Refills: 5    metoprolol succinate (TOPROL-XL) 25 MG 24 hr tablet Take 25 mg by mouth daily.  omeprazole (PRILOSEC) 20 MG capsule TAKE ONE CAPSULE BY MOUTH TWICE DAILY Qty: 60 capsule, Refills: 5    oxybutynin (DITROPAN) 5 MG tablet Take 1 tablet (5 mg total) by mouth every 8 (eight) hours as needed for bladder spasms. Qty: 90 tablet, Refills: 3    saccharomyces boulardii (FLORASTOR) 250 MG capsule Take 1 capsule (250 mg total) by mouth 2 (two) times daily. Qty: 20 capsule, Refills: 0      STOP taking these  medications     ciprofloxacin (CIPRO) 500 MG tablet          DISCHARGE INSTRUCTIONS:   Follow-up with PMD in 1 week Home health to be set up.  If you experience worsening of your admission symptoms, develop shortness of breath, life threatening emergency, suicidal or homicidal thoughts you must seek medical attention immediately by calling 911 or calling your MD immediately  if symptoms less severe.  You Must read complete instructions/literature along with all the possible adverse reactions/side effects for all the Medicines you take and that have been prescribed to you. Take any new Medicines after you have completely understood and accept all the possible adverse reactions/side effects.   Please note  You were cared for by a hospitalist during your hospital stay. If you have any questions about your discharge medications or the care you received while you were in the hospital after you are discharged, you can call the unit and asked to speak with the hospitalist on call if the hospitalist that took care of you is not available. Once you are discharged, your primary care physician will handle any further medical issues. Please note that NO REFILLS for any discharge medications will be authorized once you are discharged, as it is imperative that you return to your primary care physician (or establish a relationship with a primary care physician if you do not have one) for your aftercare needs so that they can reassess your need for medications and monitor your lab values.    Today   CHIEF COMPLAINT:   Chief Complaint  Patient presents with  . Weakness    HISTORY OF PRESENT ILLNESS:  Travis Cantrell  is a 54 y.o. male with a known history of MS and chronic indwelling Foley catheter. Presented with clinical sepsis and found to have acute cystitis with hematuria.   VITAL SIGNS:  Blood pressure 148/80, pulse 66, temperature 98.4 F (36.9 C), temperature source Oral, resp. rate 18,  height  (1.778 m), weight 91.491 kg (201 lb 11.2 oz), SpO2 94 %.  I/O:   Intake/Output Summary (Last 24 hours) at 11/05/14 0804 Last data filed at 11/05/14 0737  Gross per 24 hour  Intake    770 ml  Output   3125 ml  Net  -2355 ml    PHYSICAL EXAMINATION:  GENERAL:  54 y.o.-year-old patient lying in the bed with no acute distress.  EYES: Pupils equal, round, reactive to light and accommodation. No scleral icterus. Extraocular muscles intact.  HEENT: Head atraumatic, normocephalic. Oropharynx and nasopharynx clear.  NECK:  Supple, no jugular venous distention. No thyroid enlargement, no tenderness.  LUNGS: Normal breath sounds bilaterally, no wheezing, rales,rhonchi or crepitation. No use of accessory muscles of respiration.  CARDIOVASCULAR: S1, S2 normal. No murmurs, rubs, or gallops.  ABDOMEN: Soft, non-tender, non-distended. Bowel sounds present. No organomegaly or mass.  EXTREMITIES: 2+ pedal edema, no cyanosis, or clubbing.  NEUROLOGIC: Patient very limited mobility with arms PSYCHIATRIC: The patient is alert and oriented x 3.  SKIN: Scab seen on lower extremity  DATA REVIEW:   CBC  Recent Labs Lab 11/05/14 0314  WBC 11.4*  HGB 11.3*  HCT 35.1*  PLT 225    Chemistries   Recent Labs Lab 11/03/14 0304  11/05/14 0314  NA 140  < > 140  K 4.2  < > 3.7  CL 106  < > 106  CO2 26  < > 28  GLUCOSE 178*  < > 96  BUN 25*  < > 14  CREATININE 1.36*  < > 0.87  CALCIUM 9.0  < > 8.6*  AST 17  --   --   ALT 13*  --   --   ALKPHOS 92  --   --   BILITOT 1.0  --   --   < > = values in this interval not displayed.  Cardiac Enzymes  Recent Labs Lab 11/03/14 0304  TROPONINI <0.03    Microbiology Results  Results for orders placed or performed during the hospital encounter of 11/03/14  Urine culture     Status: None (Preliminary result)   Collection Time: 11/03/14  3:04 AM  Result Value Ref Range Status   Specimen Description URINE, CATHETERIZED  Final    Special Requests Normal  Final   Culture   Final    >=100,000 COLONIES/mL PROTEUS MIRABILIS SUSCEPTIBILITIES TO FOLLOW    Report Status PENDING  Incomplete  Culture, blood (routine x 2)     Status: None (Preliminary result)   Collection Time: 11/03/14  3:34 AM  Result Value Ref Range Status   Specimen Description BLOOD LEFT HAND  Final   Special Requests BOTTLES DRAWN AEROBIC AND ANAEROBIC 4CC  Final   Culture NO GROWTH < 12 HOURS  Final   Report Status PENDING  Incomplete  Culture, blood (routine x 2)     Status: None (Preliminary result)   Collection Time: 11/03/14  3:34 AM  Result Value Ref Range Status   Specimen Description BLOOD LEFT ASSIST CONTROL  Final   Special Requests BOTTLES DRAWN AEROBIC AND ANAEROBIC 5CC  Final   Culture NO GROWTH < 12 HOURS  Final   Report Status PENDING  Incomplete  MRSA PCR Screening     Status: None   Collection Time: 11/04/14  2:10 PM  Result Value Ref Range Status   MRSA by PCR NEGATIVE NEGATIVE Final    Comment:        The GeneXpert MRSA Assay (FDA approved for NASAL specimens only), is one component of a comprehensive MRSA colonization surveillance program. It is not intended to diagnose MRSA infection nor to guide or monitor treatment for MRSA infections.     Management plans discussed with the patient, and he is in agreement.  CODE STATUS:     Code Status Orders        Start     Ordered   11/03/14 0805  Full code   Continuous     11/03/14 0804      TOTAL TIME TAKING CARE OF THIS PATIENT: 35 minutes.    Alford Highland M.D on 11/05/2014 at 8:04 AM  Between 7am to 6pm - Pager - [phone]  After 6pm go to [email or web address] - password [email or web address] [facility]  [facility]  Office  [phone]  CC: Primary care physician; Kerby Nora, MD

## 2014-11-06 DIAGNOSIS — G35 Multiple sclerosis: Secondary | ICD-10-CM | POA: Diagnosis not present

## 2014-11-06 DIAGNOSIS — K219 Gastro-esophageal reflux disease without esophagitis: Secondary | ICD-10-CM | POA: Diagnosis not present

## 2014-11-06 DIAGNOSIS — I10 Essential (primary) hypertension: Secondary | ICD-10-CM | POA: Diagnosis not present

## 2014-11-06 DIAGNOSIS — M62421 Contracture of muscle, right upper arm: Secondary | ICD-10-CM | POA: Diagnosis not present

## 2014-11-06 DIAGNOSIS — L89154 Pressure ulcer of sacral region, stage 4: Secondary | ICD-10-CM | POA: Diagnosis not present

## 2014-11-06 DIAGNOSIS — F329 Major depressive disorder, single episode, unspecified: Secondary | ICD-10-CM | POA: Diagnosis not present

## 2014-11-06 DIAGNOSIS — E7800 Pure hypercholesterolemia, unspecified: Secondary | ICD-10-CM | POA: Diagnosis not present

## 2014-11-06 DIAGNOSIS — M62461 Contracture of muscle, right lower leg: Secondary | ICD-10-CM | POA: Diagnosis not present

## 2014-11-06 DIAGNOSIS — Z9181 History of falling: Secondary | ICD-10-CM | POA: Diagnosis not present

## 2014-11-06 DIAGNOSIS — M62462 Contracture of muscle, left lower leg: Secondary | ICD-10-CM | POA: Diagnosis not present

## 2014-11-06 DIAGNOSIS — Z466 Encounter for fitting and adjustment of urinary device: Secondary | ICD-10-CM | POA: Diagnosis not present

## 2014-11-06 DIAGNOSIS — Z993 Dependence on wheelchair: Secondary | ICD-10-CM | POA: Diagnosis not present

## 2014-11-06 DIAGNOSIS — Z87440 Personal history of urinary (tract) infections: Secondary | ICD-10-CM | POA: Diagnosis not present

## 2014-11-06 NOTE — Progress Notes (Signed)
Adverse determination from [facility]. Spoke with Dr. Renae Gloss on 11-05-14. He agreed to do peer to peer. Awaiting callback from [facility] for determination.

## 2014-11-07 ENCOUNTER — Telehealth: Payer: Self-pay | Admitting: *Deleted

## 2014-11-07 NOTE — Telephone Encounter (Signed)
Transitional care call attempted.  No answer, no voice mail to leave a message.  Will continue attempts to reach patient. 

## 2014-11-07 NOTE — Progress Notes (Signed)
Patient ID: Travis Cantrell, male   DOB: Feb 05, 1960, 54 y.o.   MRN: [id number]  Dr [facility] Peer to Peer review on phone Case denied for inpatient status and approved for observation status  Dr Alford Highland

## 2014-11-08 LAB — CULTURE, BLOOD (ROUTINE X 2)
CULTURE: NO GROWTH
CULTURE: NO GROWTH

## 2014-11-10 NOTE — Telephone Encounter (Signed)
Transitional care call attempted.  No answer, no voice mail to leave a message.  Will continue attempts to reach patient.

## 2014-11-11 NOTE — Telephone Encounter (Signed)
Transition Care Management Follow-up Telephone Call   Date discharged? 11/05/14   How have you been since you were released from the hospital? Improving   Do you understand why you were in the hospital? yes   Do you understand the discharge instructions? yes   Where were you discharged to? Home   Items Reviewed:  Medications reviewed: yes  Allergies reviewed: yes  Dietary changes reviewed: no  Referrals reviewed: yes, home health   Functional Questionnaire:   Activities of Daily Living (ADLs):   He states they are independent in the following: ambulation, bathing and hygiene, feeding, continence, grooming, toileting and dressing States they require assistance with the following: None   Any transportation issues/concerns?: Will arrange for transportation   Any patient concerns? no   Confirmed importance and date/time of follow-up visits scheduled yes, 11/14/14 @ 0915  Provider Appointment booked with Kerby Nora, MD  Confirmed with patient if condition begins to worsen call PCP or go to the ER.  Patient was given the office number and encouraged to call back with question or concerns.  : yes

## 2014-11-13 DIAGNOSIS — M62421 Contracture of muscle, right upper arm: Secondary | ICD-10-CM | POA: Diagnosis not present

## 2014-11-13 DIAGNOSIS — Z993 Dependence on wheelchair: Secondary | ICD-10-CM | POA: Diagnosis not present

## 2014-11-13 DIAGNOSIS — M62462 Contracture of muscle, left lower leg: Secondary | ICD-10-CM | POA: Diagnosis not present

## 2014-11-13 DIAGNOSIS — G35 Multiple sclerosis: Secondary | ICD-10-CM | POA: Diagnosis not present

## 2014-11-13 DIAGNOSIS — Z9181 History of falling: Secondary | ICD-10-CM | POA: Diagnosis not present

## 2014-11-13 DIAGNOSIS — L89154 Pressure ulcer of sacral region, stage 4: Secondary | ICD-10-CM | POA: Diagnosis not present

## 2014-11-13 DIAGNOSIS — Z466 Encounter for fitting and adjustment of urinary device: Secondary | ICD-10-CM | POA: Diagnosis not present

## 2014-11-13 DIAGNOSIS — E7800 Pure hypercholesterolemia, unspecified: Secondary | ICD-10-CM | POA: Diagnosis not present

## 2014-11-13 DIAGNOSIS — F329 Major depressive disorder, single episode, unspecified: Secondary | ICD-10-CM | POA: Diagnosis not present

## 2014-11-13 DIAGNOSIS — K219 Gastro-esophageal reflux disease without esophagitis: Secondary | ICD-10-CM | POA: Diagnosis not present

## 2014-11-13 DIAGNOSIS — Z87440 Personal history of urinary (tract) infections: Secondary | ICD-10-CM | POA: Diagnosis not present

## 2014-11-13 DIAGNOSIS — I10 Essential (primary) hypertension: Secondary | ICD-10-CM | POA: Diagnosis not present

## 2014-11-13 DIAGNOSIS — M62461 Contracture of muscle, right lower leg: Secondary | ICD-10-CM | POA: Diagnosis not present

## 2014-11-14 ENCOUNTER — Ambulatory Visit: Payer: Medicare Other | Admitting: Family Medicine

## 2014-11-14 ENCOUNTER — Telehealth: Payer: Self-pay | Admitting: Family Medicine

## 2014-11-14 DIAGNOSIS — Z0289 Encounter for other administrative examinations: Secondary | ICD-10-CM

## 2014-11-14 NOTE — Telephone Encounter (Signed)
Noted  

## 2014-11-14 NOTE — Telephone Encounter (Signed)
Pts care taker came in this morning looking for a possible phone number that could help transport Travis Cantrell to his appointment this afternoon. I did give them the information and phone number to [employer]. He was leaving the office to call that number and see if they would be a help this afternoon. If not his care taker said he would have to call and reschedule the appointment.

## 2014-11-20 DIAGNOSIS — Z87440 Personal history of urinary (tract) infections: Secondary | ICD-10-CM | POA: Diagnosis not present

## 2014-11-20 DIAGNOSIS — M62461 Contracture of muscle, right lower leg: Secondary | ICD-10-CM | POA: Diagnosis not present

## 2014-11-20 DIAGNOSIS — Z9181 History of falling: Secondary | ICD-10-CM | POA: Diagnosis not present

## 2014-11-20 DIAGNOSIS — F329 Major depressive disorder, single episode, unspecified: Secondary | ICD-10-CM | POA: Diagnosis not present

## 2014-11-20 DIAGNOSIS — M62462 Contracture of muscle, left lower leg: Secondary | ICD-10-CM | POA: Diagnosis not present

## 2014-11-20 DIAGNOSIS — M62421 Contracture of muscle, right upper arm: Secondary | ICD-10-CM | POA: Diagnosis not present

## 2014-11-20 DIAGNOSIS — E7800 Pure hypercholesterolemia, unspecified: Secondary | ICD-10-CM | POA: Diagnosis not present

## 2014-11-20 DIAGNOSIS — G35 Multiple sclerosis: Secondary | ICD-10-CM | POA: Diagnosis not present

## 2014-11-20 DIAGNOSIS — Z466 Encounter for fitting and adjustment of urinary device: Secondary | ICD-10-CM | POA: Diagnosis not present

## 2014-11-20 DIAGNOSIS — I10 Essential (primary) hypertension: Secondary | ICD-10-CM | POA: Diagnosis not present

## 2014-11-20 DIAGNOSIS — L89154 Pressure ulcer of sacral region, stage 4: Secondary | ICD-10-CM | POA: Diagnosis not present

## 2014-11-20 DIAGNOSIS — K219 Gastro-esophageal reflux disease without esophagitis: Secondary | ICD-10-CM | POA: Diagnosis not present

## 2014-11-20 DIAGNOSIS — Z993 Dependence on wheelchair: Secondary | ICD-10-CM | POA: Diagnosis not present

## 2014-11-21 DIAGNOSIS — Z9181 History of falling: Secondary | ICD-10-CM

## 2014-11-21 DIAGNOSIS — M62461 Contracture of muscle, right lower leg: Secondary | ICD-10-CM

## 2014-11-21 DIAGNOSIS — K219 Gastro-esophageal reflux disease without esophagitis: Secondary | ICD-10-CM

## 2014-11-21 DIAGNOSIS — Z466 Encounter for fitting and adjustment of urinary device: Secondary | ICD-10-CM | POA: Diagnosis not present

## 2014-11-21 DIAGNOSIS — M62462 Contracture of muscle, left lower leg: Secondary | ICD-10-CM

## 2014-11-21 DIAGNOSIS — Z993 Dependence on wheelchair: Secondary | ICD-10-CM

## 2014-11-21 DIAGNOSIS — M62421 Contracture of muscle, right upper arm: Secondary | ICD-10-CM

## 2014-11-21 DIAGNOSIS — I10 Essential (primary) hypertension: Secondary | ICD-10-CM | POA: Diagnosis not present

## 2014-11-21 DIAGNOSIS — E7800 Pure hypercholesterolemia, unspecified: Secondary | ICD-10-CM

## 2014-11-21 DIAGNOSIS — G35 Multiple sclerosis: Secondary | ICD-10-CM | POA: Diagnosis not present

## 2014-11-21 DIAGNOSIS — F329 Major depressive disorder, single episode, unspecified: Secondary | ICD-10-CM

## 2014-11-21 DIAGNOSIS — L89154 Pressure ulcer of sacral region, stage 4: Secondary | ICD-10-CM | POA: Diagnosis not present

## 2014-11-24 DIAGNOSIS — N319 Neuromuscular dysfunction of bladder, unspecified: Secondary | ICD-10-CM | POA: Diagnosis not present

## 2014-12-15 ENCOUNTER — Other Ambulatory Visit: Payer: Self-pay | Admitting: *Deleted

## 2014-12-15 NOTE — Telephone Encounter (Signed)
Last office visit 07/24/2014.  Last refilled 10/16/2014 for #360 with no refills.  Ok to refill?

## 2014-12-16 MED ORDER — GABAPENTIN 300 MG PO CAPS: 900.0000 mg | ORAL_CAPSULE | Freq: Three times a day (TID) | ORAL | Status: DC

## 2014-12-31 DIAGNOSIS — R32 Unspecified urinary incontinence: Secondary | ICD-10-CM | POA: Diagnosis not present

## 2014-12-31 DIAGNOSIS — L89152 Pressure ulcer of sacral region, stage 2: Secondary | ICD-10-CM | POA: Diagnosis not present

## 2015-01-03 ENCOUNTER — Other Ambulatory Visit: Payer: Self-pay | Admitting: Family Medicine

## 2015-01-03 ENCOUNTER — Emergency Department
Admission: EM | Admit: 2015-01-03 | Discharge: 2015-01-03 | Disposition: A | Discharge status: Home / Self Care | Payer: Medicare Other | Attending: Emergency Medicine | Admitting: Emergency Medicine

## 2015-01-03 DIAGNOSIS — Z79899 Other long term (current) drug therapy: Secondary | ICD-10-CM | POA: Diagnosis not present

## 2015-01-03 DIAGNOSIS — I10 Essential (primary) hypertension: Secondary | ICD-10-CM | POA: Insufficient documentation

## 2015-01-03 DIAGNOSIS — N390 Urinary tract infection, site not specified: Secondary | ICD-10-CM | POA: Diagnosis not present

## 2015-01-03 DIAGNOSIS — R030 Elevated blood-pressure reading, without diagnosis of hypertension: Secondary | ICD-10-CM | POA: Diagnosis not present

## 2015-01-03 DIAGNOSIS — R1030 Lower abdominal pain, unspecified: Secondary | ICD-10-CM | POA: Diagnosis present

## 2015-01-03 LAB — CBC WITH DIFFERENTIAL/PLATELET
BASOS ABS: 0.1 10*3/uL (ref 0–0.1)
BASOS PCT: 1 %
EOS ABS: 0.1 10*3/uL (ref 0–0.7)
EOS PCT: 0 %
HEMATOCRIT: 49.6 % (ref 40.0–52.0)
Hemoglobin: 15.8 g/dL (ref 13.0–18.0)
LYMPHS ABS: 1.1 10*3/uL (ref 1.0–3.6)
Lymphocytes Relative: 5 %
MCH: 25.3 pg — ABNORMAL LOW (ref 26.0–34.0)
MCHC: 31.7 g/dL — ABNORMAL LOW (ref 32.0–36.0)
MCV: 79.6 fL — AB (ref 80.0–100.0)
Monocytes Absolute: 1.3 10*3/uL — ABNORMAL HIGH (ref 0.2–1.0)
Monocytes Relative: 6 %
NEUTROS PCT: 88 %
Neutro Abs: 21.3 10*3/uL — ABNORMAL HIGH (ref 1.4–6.5)
PLATELETS: 365 10*3/uL (ref 150–440)
RBC: 6.23 MIL/uL — AB (ref 4.40–5.90)
RDW: 14.7 % — AB (ref 11.5–14.5)
WBC: 23.9 10*3/uL — AB (ref 3.8–10.6)

## 2015-01-03 LAB — BASIC METABOLIC PANEL
ANION GAP: 7 (ref 5–15)
BUN: 28 mg/dL — ABNORMAL HIGH (ref 6–20)
CALCIUM: 9.4 mg/dL (ref 8.9–10.3)
CO2: 26 mmol/L (ref 22–32)
Chloride: 107 mmol/L (ref 101–111)
Creatinine, Ser: 1.22 mg/dL (ref 0.61–1.24)
GLUCOSE: 132 mg/dL — AB (ref 65–99)
POTASSIUM: 4.6 mmol/L (ref 3.5–5.1)
Sodium: 140 mmol/L (ref 135–145)

## 2015-01-03 LAB — URINALYSIS COMPLETE WITH MICROSCOPIC (ARMC ONLY)
BILIRUBIN URINE: NEGATIVE
GLUCOSE, UA: NEGATIVE mg/dL
KETONES UR: NEGATIVE mg/dL
NITRITE: POSITIVE — AB
PH: 8 (ref 5.0–8.0)
Protein, ur: 100 mg/dL — AB
SPECIFIC GRAVITY, URINE: 1.01 (ref 1.005–1.030)
Squamous Epithelial / LPF: NONE SEEN

## 2015-01-03 MED ORDER — AMOXICILLIN-POT CLAVULANATE 875-125 MG PO TABS: 1.0000 | ORAL_TABLET | Freq: Two times a day (BID) | ORAL | Status: AC

## 2015-01-03 MED ORDER — DEXTROSE 5 % IV SOLN
1.0000 g | Freq: Once | INTRAVENOUS | Status: AC
  Administered 2015-01-03: 1 g via INTRAVENOUS
  Filled 2015-01-03: qty 10

## 2015-01-03 MED ORDER — SODIUM CHLORIDE 0.9 % IV BOLUS (SEPSIS)
500.0000 mL | Freq: Once | INTRAVENOUS | Status: AC
  Administered 2015-01-03: 500 mL via INTRAVENOUS

## 2015-01-03 NOTE — ED Notes (Signed)
Roommate aware patient being discharge. Family anticipating patient arriving home.

## 2015-01-03 NOTE — ED Notes (Signed)
[facility] to transfer patient home.

## 2015-01-03 NOTE — ED Notes (Signed)
Pt discharged home via [facility].  Family aware patient discharged and will be at the house waiting for him.

## 2015-01-03 NOTE — Telephone Encounter (Signed)
Last office visit 07/24/2014.  Last refilled 10/28/2014 for #30 with no refills.  Ok to refill?

## 2015-01-03 NOTE — Discharge Instructions (Signed)
Please seek medical attention for any high fevers, chest pain, shortness of breath, change in behavior, persistent vomiting, bloody stool or any other new or concerning symptoms. ° ° °Urinary Tract Infection °Urinary tract infections (UTIs) can develop anywhere along your urinary tract. Your urinary tract is your body's drainage system for removing wastes and extra water. Your urinary tract includes two kidneys, two ureters, a bladder, and a urethra. Your kidneys are a pair of bean-shaped organs. Each kidney is about the size of your fist. They are located below your ribs, one on each side of your spine. °CAUSES °Infections are caused by microbes, which are microscopic organisms, including fungi, viruses, and bacteria. These organisms are so small that they can only be seen through a microscope. Bacteria are the microbes that most commonly cause UTIs. °SYMPTOMS  °Symptoms of UTIs may vary by age and gender of the patient and by the location of the infection. Symptoms in young women typically include a frequent and intense urge to urinate and a painful, burning feeling in the bladder or urethra during urination. Older women and men are more likely to be tired, shaky, and weak and have muscle aches and abdominal pain. A fever may mean the infection is in your kidneys. Other symptoms of a kidney infection include pain in your back or sides below the ribs, nausea, and vomiting. °DIAGNOSIS °To diagnose a UTI, your caregiver will ask you about your symptoms. Your caregiver will also ask you to provide a urine sample. The urine sample will be tested for bacteria and white blood cells. White blood cells are made by your body to help fight infection. °TREATMENT  °Typically, UTIs can be treated with medication. Because most UTIs are caused by a bacterial infection, they usually can be treated with the use of antibiotics. The choice of antibiotic and length of treatment depend on your symptoms and the type of bacteria causing  your infection. °HOME CARE INSTRUCTIONS °· If you were prescribed antibiotics, take them exactly as your caregiver instructs you. Finish the medication even if you feel better after you have only taken some of the medication. °· Drink enough water and fluids to keep your urine clear or pale yellow. °· Avoid caffeine, tea, and carbonated beverages. They tend to irritate your bladder. °· Empty your bladder often. Avoid holding urine for long periods of time. °· Empty your bladder before and after sexual intercourse. °· After a bowel movement, women should cleanse from front to back. Use each tissue only once. °SEEK MEDICAL CARE IF:  °· You have back pain. °· You develop a fever. °· Your symptoms do not begin to resolve within 3 days. °SEEK IMMEDIATE MEDICAL CARE IF:  °· You have severe back pain or lower abdominal pain. °· You develop chills. °· You have nausea or vomiting. °· You have continued burning or discomfort with urination. °MAKE SURE YOU:  °· Understand these instructions. °· Will watch your condition. °· Will get help right away if you are not doing well or get worse. °  °This information is not intended to replace advice given to you by your health care provider. Make sure you discuss any questions you have with your health care provider. °  °Document Released: 10/20/2004 Document Revised: 10/01/2014 Document Reviewed: 02/18/2011 °Elsevier Interactive Patient Education ©2016 [employer] ° °

## 2015-01-03 NOTE — ED Notes (Addendum)
Old foley catheter removed. Patient tolerated well.

## 2015-01-03 NOTE — ED Notes (Signed)
Pt arrived via EMS from home with c/o UTI.  Pt reports sxs started last night. Has foley catheter in place from home. States it is a 88fr, but usually switches to 41fr. Urine is leaking out around the catheter site per patient.  Pt lives at home with a roommate. Denies any burning but complains of pain below umbilicus. Foley was changed 2 weeks ago.
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
## 2015-01-05 LAB — URINE CULTURE: Culture: >=100000

## 2015-01-05 NOTE — Telephone Encounter (Signed)
Diazepam called into [facility] [address] 

## 2015-01-06 NOTE — Progress Notes (Signed)
ED Culture Report - Pharmacy Note  Urine cultures with >100k Proteus mirabilis.  Pt d/c'd on Augmentin. UCx shows intermediate sensitivity to ampicillin. Called micro lab, also intermediate sensitivity to ampicillin-sulbactam. Called pt and informed pt that we calling in a new antibiotic (cephalexin 500 mg PO BID x10 days) and that he should stop taking the previous one when he picks up the new antibiotic. Asked pt what questions he has, stated he has none.  Pt states [facility] uses [facility] [address]; Rx called into pharmacist, Cala Bradford, at that pharmacy.     Crist Fat, PharmD, BCPS Clinical Pharmacist 01/06/2015 2:34 PM

## 2015-01-07 ENCOUNTER — Other Ambulatory Visit: Payer: Self-pay | Admitting: Family Medicine

## 2015-01-07 NOTE — Telephone Encounter (Signed)
Pt request status of refill for diazepam; advised called to [facility] [address] on 01/05/15. Pt will ck with pharmacy.

## 2015-01-08 NOTE — Telephone Encounter (Signed)
Last office visit 07/24/2014.  Last refilled 10/28/2014 for #90 with no refills.  Ok to refill?

## 2015-01-29 ENCOUNTER — Observation Stay
Admission: EM | Admit: 2015-01-29 | Discharge: 2015-02-02 | Disposition: A | Discharge status: Home / Self Care | Payer: Medicare Other | Attending: Internal Medicine | Admitting: Internal Medicine

## 2015-01-29 ENCOUNTER — Encounter: Payer: Self-pay | Admitting: *Deleted

## 2015-01-29 DIAGNOSIS — L8990 Pressure ulcer of unspecified site, unspecified stage: Secondary | ICD-10-CM | POA: Diagnosis not present

## 2015-01-29 DIAGNOSIS — Z8249 Family history of ischemic heart disease and other diseases of the circulatory system: Secondary | ICD-10-CM | POA: Diagnosis not present

## 2015-01-29 DIAGNOSIS — R6510 Systemic inflammatory response syndrome (SIRS) of non-infectious origin without acute organ dysfunction: Secondary | ICD-10-CM | POA: Diagnosis not present

## 2015-01-29 DIAGNOSIS — K219 Gastro-esophageal reflux disease without esophagitis: Secondary | ICD-10-CM | POA: Insufficient documentation

## 2015-01-29 DIAGNOSIS — Z888 Allergy status to other drugs, medicaments and biological substances status: Secondary | ICD-10-CM | POA: Insufficient documentation

## 2015-01-29 DIAGNOSIS — Z87440 Personal history of urinary (tract) infections: Secondary | ICD-10-CM | POA: Insufficient documentation

## 2015-01-29 DIAGNOSIS — Z833 Family history of diabetes mellitus: Secondary | ICD-10-CM | POA: Insufficient documentation

## 2015-01-29 DIAGNOSIS — Z79899 Other long term (current) drug therapy: Secondary | ICD-10-CM | POA: Insufficient documentation

## 2015-01-29 DIAGNOSIS — I10 Essential (primary) hypertension: Secondary | ICD-10-CM | POA: Diagnosis not present

## 2015-01-29 DIAGNOSIS — R1030 Lower abdominal pain, unspecified: Secondary | ICD-10-CM | POA: Insufficient documentation

## 2015-01-29 DIAGNOSIS — N390 Urinary tract infection, site not specified: Principal | ICD-10-CM | POA: Insufficient documentation

## 2015-01-29 DIAGNOSIS — E860 Dehydration: Secondary | ICD-10-CM | POA: Diagnosis not present

## 2015-01-29 DIAGNOSIS — G8220 Paraplegia, unspecified: Secondary | ICD-10-CM | POA: Diagnosis not present

## 2015-01-29 DIAGNOSIS — T83511A Infection and inflammatory reaction due to indwelling urethral catheter, initial encounter: Secondary | ICD-10-CM | POA: Insufficient documentation

## 2015-01-29 DIAGNOSIS — R109 Unspecified abdominal pain: Secondary | ICD-10-CM | POA: Diagnosis not present

## 2015-01-29 DIAGNOSIS — N179 Acute kidney failure, unspecified: Secondary | ICD-10-CM | POA: Insufficient documentation

## 2015-01-29 DIAGNOSIS — Y846 Urinary catheterization as the cause of abnormal reaction of the patient, or of later complication, without mention of misadventure at the time of the procedure: Secondary | ICD-10-CM | POA: Insufficient documentation

## 2015-01-29 DIAGNOSIS — Z807 Family history of other malignant neoplasms of lymphoid, hematopoietic and related tissues: Secondary | ICD-10-CM | POA: Insufficient documentation

## 2015-01-29 DIAGNOSIS — G35 Multiple sclerosis: Secondary | ICD-10-CM | POA: Insufficient documentation

## 2015-01-29 HISTORY — DX: Pressure ulcer of unspecified site, unspecified stage: L89.90

## 2015-01-29 LAB — COMPREHENSIVE METABOLIC PANEL
ALBUMIN: 3.9 g/dL (ref 3.5–5.0)
ALT: 17 U/L (ref 17–63)
AST: 15 U/L (ref 15–41)
Alkaline Phosphatase: 100 U/L (ref 38–126)
Anion gap: 6 (ref 5–15)
BILIRUBIN TOTAL: 1 mg/dL (ref 0.3–1.2)
BUN: 32 mg/dL — AB (ref 6–20)
CALCIUM: 9.1 mg/dL (ref 8.9–10.3)
CO2: 27 mmol/L (ref 22–32)
CREATININE: 1.64 mg/dL — AB (ref 0.61–1.24)
Chloride: 104 mmol/L (ref 101–111)
GFR, EST AFRICAN AMERICAN: 53 mL/min — AB (ref >60)
GFR, EST NON AFRICAN AMERICAN: 46 mL/min — AB (ref >60)
Glucose, Bld: 156 mg/dL — ABNORMAL HIGH (ref 65–99)
Potassium: 4 mmol/L (ref 3.5–5.1)
SODIUM: 137 mmol/L (ref 135–145)
TOTAL PROTEIN: 8.1 g/dL (ref 6.5–8.1)

## 2015-01-29 LAB — CBC WITH DIFFERENTIAL/PLATELET
BASOS PCT: 1 %
Basophils Absolute: 0.2 10*3/uL — ABNORMAL HIGH (ref 0–0.1)
EOS ABS: 0.6 10*3/uL (ref 0–0.7)
Eosinophils Relative: 3 %
HCT: 46.8 % (ref 40.0–52.0)
Hemoglobin: 14.8 g/dL (ref 13.0–18.0)
Lymphocytes Relative: 13 %
Lymphs Abs: 2.7 10*3/uL (ref 1.0–3.6)
MCH: 25.5 pg — ABNORMAL LOW (ref 26.0–34.0)
MCHC: 31.7 g/dL — AB (ref 32.0–36.0)
MCV: 80.4 fL (ref 80.0–100.0)
MONO ABS: 1.6 10*3/uL — AB (ref 0.2–1.0)
MONOS PCT: 8 %
NEUTROS PCT: 75 %
Neutro Abs: 16.3 10*3/uL — ABNORMAL HIGH (ref 1.4–6.5)
Platelets: 373 10*3/uL (ref 150–440)
RBC: 5.82 MIL/uL (ref 4.40–5.90)
RDW: 14.9 % — AB (ref 11.5–14.5)
WBC: 21.3 10*3/uL — ABNORMAL HIGH (ref 3.8–10.6)

## 2015-01-29 LAB — URINALYSIS COMPLETE WITH MICROSCOPIC (ARMC ONLY)
Bilirubin Urine: NEGATIVE
GLUCOSE, UA: NEGATIVE mg/dL
KETONES UR: NEGATIVE mg/dL
Nitrite: POSITIVE — AB
SPECIFIC GRAVITY, URINE: 1.012 (ref 1.005–1.030)
Squamous Epithelial / LPF: NONE SEEN
pH: 9 — ABNORMAL HIGH (ref 5.0–8.0)

## 2015-01-29 LAB — MRSA PCR SCREENING: MRSA BY PCR: NEGATIVE

## 2015-01-29 LAB — LIPASE, BLOOD: LIPASE: 25 U/L (ref 11–51)

## 2015-01-29 MED ORDER — ENSURE ENLIVE PO LIQD
237.0000 mL | Freq: Two times a day (BID) | ORAL | Status: DC
  Administered 2015-01-29 – 2015-02-02 (×7): 237 mL via ORAL

## 2015-01-29 MED ORDER — DIAZEPAM 2 MG PO TABS
5.0000 mg | ORAL_TABLET | Freq: Two times a day (BID) | ORAL | Status: DC
  Administered 2015-01-29 – 2015-02-02 (×9): 5 mg via ORAL
  Filled 2015-01-29 (×10): qty 3

## 2015-01-29 MED ORDER — ATORVASTATIN CALCIUM 20 MG PO TABS
40.0000 mg | ORAL_TABLET | Freq: Every day | ORAL | Status: DC
  Administered 2015-01-29 – 2015-02-01 (×4): 40 mg via ORAL
  Filled 2015-01-29 (×4): qty 2

## 2015-01-29 MED ORDER — FLUOXETINE HCL 20 MG PO CAPS
20.0000 mg | ORAL_CAPSULE | Freq: Every day | ORAL | Status: DC
  Administered 2015-01-29 – 2015-02-02 (×5): 20 mg via ORAL
  Filled 2015-01-29 (×5): qty 1

## 2015-01-29 MED ORDER — DEXTROSE 5 % IV SOLN
1.0000 g | INTRAVENOUS | Status: DC
  Administered 2015-01-30: 1 g via INTRAVENOUS
  Filled 2015-01-29 (×2): qty 10

## 2015-01-29 MED ORDER — ACETAMINOPHEN 325 MG PO TABS: 650.0000 mg | ORAL_TABLET | Freq: Four times a day (QID) | ORAL | Status: DC | PRN

## 2015-01-29 MED ORDER — MORPHINE SULFATE (PF) 2 MG/ML IV SOLN
2.0000 mg | INTRAVENOUS | Status: DC | PRN
Start: 2015-01-29 — End: 2015-02-02

## 2015-01-29 MED ORDER — PANTOPRAZOLE SODIUM 40 MG PO TBEC
40.0000 mg | DELAYED_RELEASE_TABLET | Freq: Every day | ORAL | Status: DC
  Administered 2015-01-29 – 2015-02-02 (×5): 40 mg via ORAL
  Filled 2015-01-29 (×5): qty 1

## 2015-01-29 MED ORDER — METOPROLOL SUCCINATE ER 25 MG PO TB24
25.0000 mg | ORAL_TABLET | Freq: Every day | ORAL | Status: DC
  Administered 2015-01-29 – 2015-02-02 (×5): 25 mg via ORAL
  Filled 2015-01-29 (×5): qty 1

## 2015-01-29 MED ORDER — DEXTROSE 5 % IV SOLN
1.0000 g | Freq: Once | INTRAVENOUS | Status: AC
  Administered 2015-01-29: 1 g via INTRAVENOUS
  Filled 2015-01-29: qty 10

## 2015-01-29 MED ORDER — ENOXAPARIN SODIUM 40 MG/0.4ML ~~LOC~~ SOLN
40.0000 mg | SUBCUTANEOUS | Status: DC
  Administered 2015-01-29 – 2015-02-01 (×4): 40 mg via SUBCUTANEOUS
  Filled 2015-01-29 (×4): qty 0.4

## 2015-01-29 MED ORDER — LISINOPRIL 10 MG PO TABS
10.0000 mg | ORAL_TABLET | Freq: Every day | ORAL | Status: DC
  Administered 2015-01-29 – 2015-02-02 (×5): 10 mg via ORAL
  Filled 2015-01-29 (×5): qty 1

## 2015-01-29 MED ORDER — GABAPENTIN 300 MG PO CAPS
900.0000 mg | ORAL_CAPSULE | Freq: Three times a day (TID) | ORAL | Status: DC
  Administered 2015-01-29 – 2015-02-02 (×13): 900 mg via ORAL
  Filled 2015-01-29 (×14): qty 3

## 2015-01-29 MED ORDER — OXYBUTYNIN CHLORIDE 5 MG PO TABS: 5.0000 mg | ORAL_TABLET | Freq: Three times a day (TID) | ORAL | Status: DC | PRN

## 2015-01-29 MED ORDER — SODIUM CHLORIDE 0.9 % IV SOLN
INTRAVENOUS | Status: DC
Start: 2015-01-29 — End: 2015-01-30
  Administered 2015-01-29 – 2015-01-30 (×2): via INTRAVENOUS

## 2015-01-29 MED ORDER — AMLODIPINE BESYLATE 5 MG PO TABS
5.0000 mg | ORAL_TABLET | Freq: Every day | ORAL | Status: DC
  Administered 2015-01-29 – 2015-02-02 (×5): 5 mg via ORAL
  Filled 2015-01-29 (×5): qty 1

## 2015-01-29 MED ORDER — ONDANSETRON HCL 4 MG/2ML IJ SOLN: 4.0000 mg | Freq: Four times a day (QID) | INTRAMUSCULAR | Status: DC | PRN

## 2015-01-29 MED ORDER — SACCHAROMYCES BOULARDII 250 MG PO CAPS
250.0000 mg | ORAL_CAPSULE | Freq: Two times a day (BID) | ORAL | Status: DC
  Administered 2015-01-29 – 2015-02-02 (×9): 250 mg via ORAL
  Filled 2015-01-29 (×11): qty 1

## 2015-01-29 MED ORDER — DOCUSATE SODIUM 100 MG PO CAPS
100.0000 mg | ORAL_CAPSULE | Freq: Two times a day (BID) | ORAL | Status: DC
  Administered 2015-01-29 – 2015-02-01 (×8): 100 mg via ORAL
  Filled 2015-01-29 (×9): qty 1

## 2015-01-29 MED ORDER — VITAMIN D 1000 UNITS PO TABS
1000.0000 [IU] | ORAL_TABLET | Freq: Every day | ORAL | Status: DC
  Administered 2015-01-29 – 2015-02-02 (×5): 1000 [IU] via ORAL
  Filled 2015-01-29 (×5): qty 1

## 2015-01-29 NOTE — Progress Notes (Signed)
PT Cancellation Note  Patient Details Name: Travis Cantrell MRN: [id number] DOB: 03/06/1960   Cancelled Treatment:    Reason Eval/Treat Not Completed: PT screened, no needs identified, will sign off.  Pt is a 55 yo male with MS and permanent catheter admitted for UTI.  Pt reports he uses a hoyer lift at home to get into a recliner or his electric wheelchair.  Pt able to use L arm for bed mobility (rolling) and reports he is at baseline at this time.  Pt does not express any additional PT needs.  RN notified of need for lift for OOB mobility for patient.  Please re-consult if new need arises.   Nehemias Sauceda A Donnivan Villena, PT 01/29/2015, 1:29 PM

## 2015-01-29 NOTE — Consult Note (Signed)
ANTIBIOTIC CONSULT NOTE - INITIAL  Pharmacy Consult for Ceftriaxone Indication: UTI  Allergies  Allergen Reactions  . Baclofen Diarrhea    Patient Measurements: Height:  (177.8 cm) Weight: 207 lb (93.895 kg) IBW/kg (Calculated) : 73  Vital Signs: Temp: 98.5 F (36.9 C) (01/05 0448) Temp Source: Oral (01/05 0448) BP: 122/82 mmHg (01/05 0700) Pulse Rate: 72 (01/05 0700) Intake/Output from previous day: 01/04 0701 - 01/05 0700 In: 100  Out: -  Intake/Output from this shift:    Labs:  Recent Labs  01/29/15 0500  WBC 21.3*  HGB 14.8  PLT 373  CREATININE 1.64*   Estimated Creatinine Clearance: 59.3 mL/min (by C-G formula based on Cr of 1.64).  Microbiology: Recent Results (from the past 720 hour(s))  Urine culture     Status: None   Collection Time: 01/03/15  3:30 PM  Result Value Ref Range Status   Specimen Description URINE, RANDOM  Final   Special Requests NONE  Final   Culture >=100,000 COLONIES/mL PROTEUS MIRABILIS  Final   Report Status 01/05/2015 FINAL  Final   Organism ID, Bacteria PROTEUS MIRABILIS  Final      Susceptibility   Proteus mirabilis - MIC*    AMPICILLIN 16 INTERMEDIATE Intermediate     CEFTAZIDIME <=1 SENSITIVE Sensitive     CEFAZOLIN 8 SENSITIVE Sensitive     CEFTRIAXONE <=1 SENSITIVE Sensitive     CIPROFLOXACIN >=4 RESISTANT Resistant     GENTAMICIN <=1 SENSITIVE Sensitive     IMIPENEM >=16 RESISTANT Resistant     TRIMETH/SULFA 160 RESISTANT Resistant     PIP/TAZO Value in next row Sensitive      SENSITIVE<=4    * >=100,000 COLONIES/mL PROTEUS MIRABILIS    Medical History: Past Medical History  Diagnosis Date  . Allergy   . GERD (gastroesophageal reflux disease)   . MS (multiple sclerosis) ([facility])   . Hypertension   . Decubitus ulcer      Assessment: 55 y.o. male with chronic indwelling Foley catheter. Patient's catheter was changed in the emergency room and it was full of pus. Urinalysis showed infection. In the past  patient had urinary tract infection Proteus mirabilis.   Goal of Therapy:  Resolution of infection  Plan:  Ordered Ceftriaxone 1g IV Q24H.    Follow up culture results  Stormy Card, RPh Clinical Pharmacist 01/29/2015,8:00 AM

## 2015-01-29 NOTE — ED Notes (Addendum)
Pt from home, Hx MS, has a foley catheter , pt is contracted,immobile .  Urine is very cloudy, sediment noted.

## 2015-01-29 NOTE — H&P (Signed)
[place] [facility] [place] NAME: Travis Cantrell    MR#:  [id number]  DATE OF BIRTH:  25-Sep-1960  DATE OF ADMISSION:  01/29/2015  PRIMARY CARE PHYSICIAN: Eliezer Lofts, MD   REQUESTING/REFERRING PHYSICIAN:   CHIEF COMPLAINT:   Chief Complaint  Patient presents with  . Abdominal Pain    HISTORY OF PRESENT ILLNESS: Travis Cantrell  is a 55 y.o. male with a known history of gastroesophageal reflux disorder, multiple sclerosis, hypertension, chronic indwelling Foley catheter noted to the emergency room with lower abdominal pain. The abdominal pain was aching in nature 4 out of 10 on a scale of 1-10. Pain is nonradiating. Patient has chronic indwelling Foley catheter. Patient's catheter was changed in the emergency room and it was full of pus. Urinalysis showed infection. In the past patient had urinary tract infection Proteus mirabilis. No history of any fever or chills. No chest pain or shortness of breath. Patient was evaluated in the emergency room and was also found to have a decubitus ulcer. No history of fall or head injury.  PAST MEDICAL HISTORY:   Past Medical History  Diagnosis Date  . Allergy   . GERD (gastroesophageal reflux disease)   . MS (multiple sclerosis) ([place])   . Hypertension   . Decubitus ulcer     PAST SURGICAL HISTORY: Past Surgical History  Procedure Laterality Date  . Vein reconsruction    . Rotator cuff repair      SOCIAL HISTORY:  Social History  Substance Use Topics  . Smoking status: Never Smoker   . Smokeless tobacco: Never Used  . Alcohol Use: No    FAMILY HISTORY:  Family History  Problem Relation Age of Onset  . Cancer Mother     multiple myeloma  . Diabetes Father   . Heart attack Father     DRUG ALLERGIES:  Allergies  Allergen Reactions  . Baclofen Diarrhea    REVIEW OF SYSTEMS:   CONSTITUTIONAL: No fever, Has weakness.  EYES: No blurred or double vision.  EARS, NOSE, AND THROAT: No tinnitus  or ear pain.  RESPIRATORY: No cough, shortness of breath, wheezing or hemoptysis.  CARDIOVASCULAR: No chest pain, orthopnea, edema.  GASTROINTESTINAL: No nausea, vomiting, diarrhea, has only  abdominal pain.  GENITOURINARY: No dysuria, hematuria.  ENDOCRINE: No polyuria, nocturia,  HEMATOLOGY: No anemia, easy bruising or bleeding SKIN: decubitus ulcer MUSCULOSKELETAL: No joint pain or arthritis.   NEUROLOGIC: No new tingling, numbness, weakness.  PSYCHIATRY: No anxiety or depression.   MEDICATIONS AT HOME:  Prior to Admission medications   Medication Sig Start Date End Date Taking? Authorizing Provider  acetaminophen (TYLENOL) 325 MG tablet Take 2 tablets (650 mg total) by mouth every 6 (six) hours as needed for mild pain (or Fever >/= 101). 08/04/14  Yes Aruna Gouru, MD  amLODipine (NORVASC) 5 MG tablet Take 1 tablet (5 mg total) by mouth daily. 06/18/14  Yes Nicholes Mango, MD  atorvastatin (LIPITOR) 40 MG tablet Take 1 tablet (40 mg total) by mouth daily. 07/02/14  Yes Amy Cletis [place], MD  Cholecalciferol (VITAMIN D-3 PO) Take 1 tablet by mouth daily.    Yes Historical Provider, MD  diazepam (VALIUM) 5 MG tablet TAKE ONE TABLET BY MOUTH EVERY 12 HOURS 01/04/15  Yes Amy E Bedsole, MD  docusate sodium (COLACE) 100 MG capsule Take 1 capsule (100 mg total) by mouth 2 (two) times daily. 06/18/14  Yes Nicholes Mango, MD  feeding supplement, ENSURE ENLIVE, (ENSURE ENLIVE)  LIQD Take 237 mLs by mouth 2 (two) times daily with a meal. 08/04/14  Yes Nicholes Mango, MD  FLUoxetine (PROZAC) 20 MG capsule TAKE TWO CAPSULES BY MOUTH IN THE MORNING AND ONE IN THE EVENING 10/27/14  Yes Amy E Bedsole, MD  gabapentin (NEURONTIN) 300 MG capsule Take 3 capsules (900 mg total) by mouth 3 (three) times daily. 12/16/14  Yes Amy E Bedsole, MD  ibuprofen (ADVIL,MOTRIN) 800 MG tablet TAKE ONE TABLET BY MOUTH EVERY 8 HOURS AS NEEDED 01/08/15  Yes Amy E Bedsole, MD  lisinopril (PRINIVIL,ZESTRIL) 10 MG tablet TAKE ONE TABLET BY MOUTH  ONCE DAILY 09/03/14  Yes Amy E Bedsole, MD  metoprolol succinate (TOPROL-XL) 25 MG 24 hr tablet Take 25 mg by mouth daily.    Yes Historical Provider, MD  omeprazole (PRILOSEC) 20 MG capsule TAKE ONE CAPSULE BY MOUTH TWICE DAILY 09/25/14  Yes Amy E Bedsole, MD  oxybutynin (DITROPAN) 5 MG tablet Take 1 tablet (5 mg total) by mouth every 8 (eight) hours as needed for bladder spasms. 10/17/14  Yes Amy Cletis [place], MD  saccharomyces boulardii (FLORASTOR) 250 MG capsule Take 1 capsule (250 mg total) by mouth 2 (two) times daily. 06/18/14  Yes Nicholes Mango, MD      PHYSICAL EXAMINATION:   VITAL SIGNS: Blood pressure 122/82, pulse 72, temperature 98.5 F (36.9 C), temperature source Oral, resp. rate 20, height '5\' 10"'  (1.778 m), weight 93.895 kg (207 lb), SpO2 96 %.  GENERAL:  55 y.o.-year-old patient lying in the bed with no acute distress.  EYES: Pupils equal, round, reactive to light and accommodation. No scleral icterus. Extraocular muscles intact.  HEENT: Head atraumatic, normocephalic. Oropharynx and nasopharynx clear.  NECK:  Supple, no jugular venous distention. No thyroid enlargement, no tenderness.  LUNGS: Normal breath sounds bilaterally, no wheezing, rales,rhonchi or crepitation. No use of accessory muscles of respiration.  CARDIOVASCULAR: S1, S2 normal. No murmurs, rubs, or gallops.  ABDOMEN: Soft, mild tenderness supra pubic area, nondistended. Bowel sounds present. No organomegaly or mass. Foley catheter noted EXTREMITIES: No pedal edema, cyanosis, or clubbing.  NEUROLOGIC: Cranial nerves II through XII are intact. Muscle strength 5/5 in all extremities. Sensation intact. Gait not checked.  PSYCHIATRIC: The patient is alert and oriented x 3.  SKIN: Decubitus ulcer noted.  LABORATORY PANEL:   CBC  Recent Labs Lab 01/29/15 0500  WBC 21.3*  HGB 14.8  HCT 46.8  PLT 373  MCV 80.4  MCH 25.5*  MCHC 31.7*  RDW 14.9*  LYMPHSABS 2.7  MONOABS 1.6*  EOSABS 0.6  BASOSABS 0.2*    ------------------------------------------------------------------------------------------------------------------  Chemistries   Recent Labs Lab 01/29/15 0500  NA 137  K 4.0  CL 104  CO2 27  GLUCOSE 156*  BUN 32*  CREATININE 1.64*  CALCIUM 9.1  AST 15  ALT 17  ALKPHOS 100  BILITOT 1.0   ------------------------------------------------------------------------------------------------------------------ estimated creatinine clearance is 59.3 mL/min (by C-G formula based on Cr of 1.64). ------------------------------------------------------------------------------------------------------------------ No results for input(s): TSH, T4TOTAL, T3FREE, THYROIDAB in the last 72 hours.  Invalid input(s): FREET3   Coagulation profile No results for input(s): INR, PROTIME in the last 168 hours. ------------------------------------------------------------------------------------------------------------------- No results for input(s): DDIMER in the last 72 hours. -------------------------------------------------------------------------------------------------------------------  Cardiac Enzymes No results for input(s): CKMB, TROPONINI, MYOGLOBIN in the last 168 hours.  Invalid input(s): CK ------------------------------------------------------------------------------------------------------------------ Invalid input(s): POCBNP  ---------------------------------------------------------------------------------------------------------------  Urinalysis    Component Value Date/Time   COLORURINE AMBER* 01/29/2015 0543   COLORURINE Amber 10/29/2013 1415   APPEARANCEUR TURBID*  01/29/2015 0543   APPEARANCEUR Turbid 10/29/2013 1415   LABSPEC 1.012 01/29/2015 0543   LABSPEC 1.016 10/29/2013 1415   PHURINE 9.0* 01/29/2015 0543   PHURINE 8.0 10/29/2013 1415   GLUCOSEU NEGATIVE 01/29/2015 0543   GLUCOSEU Negative 10/29/2013 1415   HGBUR 1+* 01/29/2015 0543   HGBUR Negative 10/29/2013  1415   BILIRUBINUR NEGATIVE 01/29/2015 0543   BILIRUBINUR Negative 10/29/2013 1415   BILIRUBINUR neg 02/18/2011 1229   KETONESUR NEGATIVE 01/29/2015 0543   KETONESUR Negative 10/29/2013 1415   PROTEINUR >500* 01/29/2015 0543   PROTEINUR 100 mg/dL 10/29/2013 1415   PROTEINUR 100 02/18/2011 1229   UROBILINOGEN 4.0* 05/28/2011 1859   UROBILINOGEN negative 02/18/2011 1229   NITRITE POSITIVE* 01/29/2015 0543   NITRITE Negative 10/29/2013 1415   NITRITE POSITIVE 02/18/2011 1229   LEUKOCYTESUR 2+* 01/29/2015 0543   LEUKOCYTESUR 3+ 10/29/2013 1415     RADIOLOGY: No results found.  EKG: Orders placed or performed during the hospital encounter of 11/03/14  . ED EKG  . ED EKG  . EKG 12-Lead  . EKG 12-Lead  . EKG    IMPRESSION AND PLAN: 55 year old male patient is to of multiple sclerosis, GERD, hypertension, chronic indwelling Foley catheter presented to the emergency room with lower abdominal pain. Admitting diagnosis 1 abdominal pain secondary to UTI 2 urinary tract infection 3 leukocytosis 4. Dehydration 5 multiple sclerosis 6 decubitus ulcer Treatment plan  Admit patient to medical floor Start patient on IV Rocephin antibiotic IV fluids Follow-up WBC count Wound care consult Pain mannagement  All the records are reviewed and case discussed with ED provider. Management plans discussed with the patient, family and they are in agreement.  CODE STATUS:FULL    TOTAL TIME TAKING CARE OF THIS PATIENT: 33mnutes.    PSaundra ShellingM.D on 01/29/2015 at 7:12 AM  Between 7am to 6pm - Pager - [phone]  After 6pm go to [email or web address] - password [email or web address]  Office  [phone] CC: Primary care physician; AEliezer Lofts MD
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
## 2015-01-29 NOTE — Consult Note (Signed)
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  Moisture Associated SKin Damage (Incontinence Associated) to groin and perineal region.  Wound type:Pressure ulcer MASD from incontinence Pressure Ulcer POA: Yes Measurement: Coccyx 3 cm x 3 cm x 0.2 cm includes left and right buttocks area.  Hydrocolloid was placed on this in the ED.  Will recommend this be removed and our sacral silicone border foam dressing be applied for inspection and to promote wound healing.  Erythema to groin and penile region from incontinence.  States he was unaware that his catheter had been leaking.  Wound bed:100% pink and moist Drainage (amount, consistency, odor) Scant serous Periwound:Erythema to groin Dressing procedure/placement/frequency:Remove hydrocolloid dressing to sacrum.  Cleanse gently and place Allevyn sacral silicone dressing. Change every 3 days and PRN soilage.  Interdry AG to groin and around base of penis for moisture wicking. (cut a strip, wrap around base of penis and tape the linen together.  Measure and cut length of InterDry Ag+ to fit in skin folds that have skin breakdown Tuck InterDry  Ag+ fabric into skin folds in a single layer, allow for 2 inches of overhang from skin edges to allow for wicking to occur May remove to bathe; dry area thoroughly and then tuck into affected areas again Do not apply any creams or ointments when using InterDry Ag+ DO NOT THROW AWAY FOR 5 DAYS unless soiled with stool DO NOT [facility] product, this will inactivate the silver in the material  New sheet of Interdry Ag+ should be applied after 5 days of use if patient continues to have skin breakdown   Will not follow at this time.  Please re-consult if needed.  Maple Hudson RN BSN CWON Pager [phone]

## 2015-01-29 NOTE — Progress Notes (Signed)
[facility] - [place] at [facility]   PATIENT NAME: Travis Cantrell    MR#:  [id number]  DATE OF BIRTH:  February 03, 1960  SUBJECTIVE:  CHIEF COMPLAINT:  Pt is tired but arousable and answers questions , denies any nausea or abd pain . Resting comfortably  REVIEW OF SYSTEMS:  CONSTITUTIONAL: No fever, fatigue or weakness.  EYES: No blurred or double vision.  EARS, NOSE, AND THROAT: No tinnitus or ear pain.  RESPIRATORY: No cough, shortness of breath, wheezing or hemoptysis.  CARDIOVASCULAR: No chest pain, orthopnea, edema.  GASTROINTESTINAL: No nausea, vomiting, diarrhea or abdominal pain.  GENITOURINARY: No dysuria, hematuria.  ENDOCRINE: No polyuria, nocturia,  HEMATOLOGY: No anemia, easy bruising or bleeding SKIN: No rash or lesion. MUSCULOSKELETAL: No joint pain or arthritis.   NEUROLOGIC: No tingling, numbness, weakness.  PSYCHIATRY: No anxiety or depression.   DRUG ALLERGIES:   Allergies  Allergen Reactions  . Baclofen Diarrhea    VITALS:  Blood pressure 94/58, pulse 69, temperature 97.9 F (36.6 C), temperature source Oral, resp. rate 16, height  (1.778 m), weight 93.895 kg (207 lb), SpO2 96 %.  PHYSICAL EXAMINATION:  GENERAL:  55 y.o.-year-old patient lying in the bed with no acute distress.  EYES: Pupils equal, round, reactive to light and accommodation. No scleral icterus. Extraocular muscles intact.  HEENT: Head atraumatic, normocephalic. Oropharynx and nasopharynx clear.  NECK:  Supple, no jugular venous distention. No thyroid enlargement, no tenderness.  LUNGS: Normal breath sounds bilaterally, no wheezing, rales,rhonchi or crepitation. No use of accessory muscles of respiration.  CARDIOVASCULAR: S1, S2 normal. No murmurs, rubs, or gallops.  ABDOMEN: Soft, nontender, nondistended. Bowel sounds present. No organomegaly or mass.  EXTREMITIES: No pedal edema, cyanosis, or clubbing.  NEUROLOGIC: Cranial nerves II through XII are intact.  Muscle strength 5/5 in all extremities. Sensation intact. Gait not checked.  PSYCHIATRIC: The patient is alert and oriented x 3.  SKIN: No obvious rash, lesion, or ulcer.    LABORATORY PANEL:   CBC  Recent Labs Lab 01/29/15 0500  WBC 21.3*  HGB 14.8  HCT 46.8  PLT 373   ------------------------------------------------------------------------------------------------------------------  Chemistries   Recent Labs Lab 01/29/15 0500  NA 137  K 4.0  CL 104  CO2 27  GLUCOSE 156*  BUN 32*  CREATININE 1.64*  CALCIUM 9.1  AST 15  ALT 17  ALKPHOS 100  BILITOT 1.0   ------------------------------------------------------------------------------------------------------------------  Cardiac Enzymes No results for input(s): TROPONINI in the last 168 hours. ------------------------------------------------------------------------------------------------------------------  RADIOLOGY:  No results found.  EKG:   Orders placed or performed during the hospital encounter of 11/03/14  . ED EKG  . ED EKG  . EKG 12-Lead  . EKG 12-Lead  . EKG    ASSESSMENT AND PLAN:   55 year old male patient is to of multiple sclerosis, GERD, hypertension, chronic indwelling Foley catheter presented to the emergency room with lower abdominal pain.  1 abdominal pain secondary to UTI Clinically better. Pain management as needed  2 urinary tract infection Continue IV Rocephin Pending urine culture and sensitivity Provide IV fluids  3 . Dehydration/AKI -pre renal IV fluids are increased to 1 50 cc/h Check a.m. labs  4.  multiple sclerosis Symptomatic treatment  5.  decubitus ulcer Wound care consult Pain mannagement     All the records are reviewed and case discussed with ED provider. Management plans discussed with the patient, family and they are in agreement.     All the records are reviewed and case discussed  with Care Management/Social Workerr. Management plans discussed  with the patient, family and they are in agreement.  CODE STATUS: fc  TOTAL TIME TAKING CARE OF THIS PATIENT: 35  minutes.   POSSIBLE D/C IN 2-3 DAYS, DEPENDING ON CLINICAL CONDITION.   Ramonita Lab M.D on 01/29/2015 at 3:31 PM  Between 7am to 6pm - Pager - [phone] After 6pm go to [email or web address] - password [email or web address] [facility]  [place] [facility]  Office  [phone]  CC: Primary care physician; Kerby Nora, MD

## 2015-01-29 NOTE — Clinical Social Work Note (Signed)
CSW received consult regarding concern about care of patient in the home. Patient's nurse informed CSW that patient's foley was in poor condition on admission as well as patient had dried food on his skin. CSW went in a couple of times to see if assessment could be completed with patient but patient was sleeping soundly both times. CSW will complete full assessment. York Spaniel MSW,LCSW [phone]

## 2015-01-30 DIAGNOSIS — E860 Dehydration: Secondary | ICD-10-CM | POA: Diagnosis not present

## 2015-01-30 DIAGNOSIS — N390 Urinary tract infection, site not specified: Secondary | ICD-10-CM | POA: Diagnosis not present

## 2015-01-30 DIAGNOSIS — G35 Multiple sclerosis: Secondary | ICD-10-CM | POA: Diagnosis not present

## 2015-01-30 DIAGNOSIS — R109 Unspecified abdominal pain: Secondary | ICD-10-CM | POA: Diagnosis not present

## 2015-01-30 LAB — BASIC METABOLIC PANEL
ANION GAP: 5 (ref 5–15)
BUN: 32 mg/dL — AB (ref 6–20)
CHLORIDE: 110 mmol/L (ref 101–111)
CO2: 24 mmol/L (ref 22–32)
Calcium: 8.5 mg/dL — ABNORMAL LOW (ref 8.9–10.3)
Creatinine, Ser: 1.12 mg/dL (ref 0.61–1.24)
GFR calc Af Amer: >60 mL/min (ref >60)
GFR calc non Af Amer: >60 mL/min (ref >60)
GLUCOSE: 111 mg/dL — AB (ref 65–99)
POTASSIUM: 4.1 mmol/L (ref 3.5–5.1)
Sodium: 139 mmol/L (ref 135–145)

## 2015-01-30 LAB — CBC
HEMATOCRIT: 38 % — AB (ref 40.0–52.0)
HEMOGLOBIN: 12.2 g/dL — AB (ref 13.0–18.0)
MCH: 25.9 pg — ABNORMAL LOW (ref 26.0–34.0)
MCHC: 32.1 g/dL (ref 32.0–36.0)
MCV: 80.5 fL (ref 80.0–100.0)
Platelets: 261 10*3/uL (ref 150–440)
RBC: 4.72 MIL/uL (ref 4.40–5.90)
RDW: 15.4 % — AB (ref 11.5–14.5)
WBC: 13.8 10*3/uL — ABNORMAL HIGH (ref 3.8–10.6)

## 2015-01-30 MED ORDER — OXYCODONE HCL 5 MG PO TABS: 5.0000 mg | ORAL_TABLET | Freq: Four times a day (QID) | ORAL | Status: DC | PRN

## 2015-01-30 NOTE — Progress Notes (Signed)
[facility] - [place] at [facility]   PATIENT NAME: Travis Cantrell    MR#:  [id number]  DATE OF BIRTH:  [birth year]  SUBJECTIVE:  CHIEF COMPLAINT:  Pt is more awake and alert, denies any nausea or abd pain . Resting comfortably  REVIEW OF SYSTEMS:  CONSTITUTIONAL: No fever, fatigue or weakness.  EYES: No blurred or double vision.  EARS, NOSE, AND THROAT: No tinnitus or ear pain.  RESPIRATORY: No cough, shortness of breath, wheezing or hemoptysis.  CARDIOVASCULAR: No chest pain, orthopnea, edema.  GASTROINTESTINAL: No nausea, vomiting, diarrhea or abdominal pain.  GENITOURINARY: No dysuria, hematuria.  ENDOCRINE: No polyuria, nocturia,  HEMATOLOGY: No anemia, easy bruising or bleeding SKIN: No rash or lesion. MUSCULOSKELETAL: No joint pain or arthritis.   NEUROLOGIC: No tingling, numbness, weakness.  PSYCHIATRY: No anxiety or depression.   DRUG ALLERGIES:   Allergies  Allergen Reactions  . Baclofen Diarrhea    VITALS:  Blood pressure 146/83, pulse 79, temperature 98.5 F (36.9 C), temperature source Oral, resp. rate 17, height 5\' 10"  (1.778 m), weight 93.895 kg (207 lb), SpO2 90 %.  PHYSICAL EXAMINATION:  GENERAL:  55 y.o.-year-old patient lying in the bed with no acute distress.  EYES: Pupils equal, round, reactive to light and accommodation. No scleral icterus. Extraocular muscles intact.  HEENT: Head atraumatic, normocephalic. Oropharynx and nasopharynx clear.  NECK:  Supple, no jugular venous distention. No thyroid enlargement, no tenderness.  LUNGS: Normal breath sounds bilaterally, no wheezing, rales,rhonchi or crepitation. No use of accessory muscles of respiration.  CARDIOVASCULAR: S1, S2 normal. No murmurs, rubs, or gallops.  ABDOMEN: Soft, nontender, nondistended. Bowel sounds present. No organomegaly or mass.  EXTREMITIES: No pedal edema, cyanosis, or clubbing.  NEUROLOGIC: Cranial nerves II through XII are intact. Muscle strength 5/5 in all  extremities. Sensation intact. Gait not checked.  PSYCHIATRIC: The patient is alert and oriented x 3.  SKIN: No obvious rash, lesion, or ulcer.    LABORATORY PANEL:   CBC  Recent Labs Lab 01/30/15 0737  WBC 13.8*  HGB 12.2*  HCT 38.0*  PLT 261   ------------------------------------------------------------------------------------------------------------------  Chemistries   Recent Labs Lab 01/29/15 0500 01/30/15 0737  NA 137 139  K 4.0 4.1  CL 104 110  CO2 27 24  GLUCOSE 156* 111*  BUN 32* 32*  CREATININE 1.64* 1.12  CALCIUM 9.1 8.5*  AST 15  --   ALT 17  --   ALKPHOS 100  --   BILITOT 1.0  --    ------------------------------------------------------------------------------------------------------------------  Cardiac Enzymes No results for input(s): TROPONINI in the last 168 hours. ------------------------------------------------------------------------------------------------------------------  RADIOLOGY:  No results found.  EKG:   Orders placed or performed during the hospital encounter of 11/03/14  . ED EKG  . ED EKG  . EKG 12-Lead  . EKG 12-Lead  . EKG    ASSESSMENT AND PLAN:   55 year old male patient is to of multiple sclerosis, GERD, hypertension, chronic indwelling Foley catheter presented to the emergency room with lower abdominal pain.  1 abdominal pain secondary to UTI Clinically better. Pain management as needed  2 urinary tract infection secondary to gram-negative rods Continue IV Rocephin  urine culture has revealed greater than 100,000 colonies of gram-negative rods and sensitivity is pending Provide IV fluids  3 . Dehydration/AKI -pre renal Resolved Decrease the rate of IV fluids Check a.m. labs  4.  multiple sclerosis Symptomatic treatment  5.  decubitus ulcer Wound care consult Pain mannagement     All the  records are reviewed and case discussed with ED provider. Management plans discussed with the patient, family  and they are in agreement.     All the records are reviewed and case discussed with Care Management/Social Workerr. Management plans discussed with the patient, family and they are in agreement.  CODE STATUS: fc  TOTAL TIME TAKING CARE OF THIS PATIENT: 35  minutes.   POSSIBLE D/C IN 2-3 DAYS, DEPENDING ON CLINICAL CONDITION.   Ramonita Lab M.D on 01/30/2015 at 3:02 PM  Between 7am to 6pm - Pager - [phone] After 6pm go to [email or web address] - password [email or web address] [facility]  [place] [facility]  Office  [phone]  CC: Primary care physician; Kerby Nora, MD
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
## 2015-01-30 NOTE — Consult Note (Signed)
ANTIBIOTIC CONSULT NOTE - follow up  Pharmacy Consult for Ceftriaxone Indication: UTI  Allergies  Allergen Reactions  . Baclofen Diarrhea    Patient Measurements: Height: 5\' 10"  (177.8 cm) Weight: 207 lb (93.895 kg) IBW/kg (Calculated) : 73  Vital Signs: Temp: 98.5 F (36.9 C) (01/06 1255) Temp Source: Oral (01/06 1255) BP: 146/83 mmHg (01/06 1255) Pulse Rate: 79 (01/06 1255) Intake/Output from previous day: 01/05 0701 - 01/06 0700 In: 2082.5 [P.O.:740; I.V.:1342.5] Out: 1733 [Urine:1730; Stool:3] Intake/Output from this shift: Total I/O In: 1790 [I.V.:1740; IV Piggyback:50] Out: 576 [Urine:575; Stool:1]  Labs:  Recent Labs  01/29/15 0500 01/30/15 0737  WBC 21.3* 13.8*  HGB 14.8 12.2*  PLT 373 261  CREATININE 1.64* 1.12   Estimated Creatinine Clearance: 86.8 mL/min (by C-G formula based on Cr of 1.12).  Microbiology: Recent Results (from the past 720 hour(s))  Urine culture     Status: None   Collection Time: 01/03/15  3:30 PM  Result Value Ref Range Status   Specimen Description URINE, RANDOM  Final   Special Requests NONE  Final   Culture >=100,000 COLONIES/mL PROTEUS MIRABILIS  Final   Report Status 01/05/2015 FINAL  Final   Organism ID, Bacteria PROTEUS MIRABILIS  Final      Susceptibility   Proteus mirabilis - MIC*    AMPICILLIN 16 INTERMEDIATE Intermediate     CEFTAZIDIME <=1 SENSITIVE Sensitive     CEFAZOLIN 8 SENSITIVE Sensitive     CEFTRIAXONE <=1 SENSITIVE Sensitive     CIPROFLOXACIN >=4 RESISTANT Resistant     GENTAMICIN <=1 SENSITIVE Sensitive     IMIPENEM >=16 RESISTANT Resistant     TRIMETH/SULFA 160 RESISTANT Resistant     PIP/TAZO Value in next row Sensitive      SENSITIVE<=4    * >=100,000 COLONIES/mL PROTEUS MIRABILIS  Blood culture (routine x 2)     Status: None (Preliminary result)   Collection Time: 01/29/15  5:30 AM  Result Value Ref Range Status   Specimen Description BLOOD LEFT AC  Final   Special Requests   Final   BOTTLES DRAWN AEROBIC AND ANAEROBIC ANAEROBIC AEROBIC   Culture NO GROWTH 1 DAY  Final   Report Status PENDING  Incomplete  Blood culture (routine x 2)     Status: None (Preliminary result)   Collection Time: 01/29/15  5:40 AM  Result Value Ref Range Status   Specimen Description BLOOD RIGHT AC  Final   Special Requests   Final    BOTTLES DRAWN AEROBIC AND ANAEROBIC ANAEROBIC AEROBIC   Culture NO GROWTH 1 DAY  Final   Report Status PENDING  Incomplete  Urine culture     Status: None (Preliminary result)   Collection Time: 01/29/15  5:43 AM  Result Value Ref Range Status   Specimen Description URINE, CATHETERIZED  Final   Special Requests NONE  Final   Culture >=100,000 COLONIES/mL GRAM NEGATIVE RODS  Final   Report Status PENDING  Incomplete  MRSA PCR Screening     Status: None   Collection Time: 01/29/15 12:50 PM  Result Value Ref Range Status   MRSA by PCR NEGATIVE NEGATIVE Final    Comment:        The GeneXpert MRSA Assay (FDA approved for NASAL specimens only), is one component of a comprehensive MRSA colonization surveillance program. It is not intended to diagnose MRSA infection nor to guide or monitor treatment for MRSA infections.     Medical History: Past Medical History  Diagnosis Date  . Allergy   . GERD (gastroesophageal reflux disease)   . MS (multiple sclerosis) (HCC)   . Hypertension   . Decubitus ulcer      Assessment: 55 y.o. male with chronic indwelling Foley catheter. Patient's catheter was changed in the emergency room and it was full of pus. Urinalysis showed infection. In the past patient had urinary tract infection Proteus mirabilis.   Goal of Therapy:  Resolution of infection  Plan:  Ordered Ceftriaxone 1g IV Q24H.    Follow up culture results  Claudett Bayly A, RPh Clinical Pharmacist 01/30/2015,2:12 PM

## 2015-01-30 NOTE — Progress Notes (Signed)
Initial Nutrition Assessment      INTERVENTION:  Meals and snacks: Cater to pt preferences Medical Nutrition Supplement therapy: continue Ensure Enlive po BID, each supplement provides 350 kcal and 20 grams of protein    NUTRITION DIAGNOSIS:   Increased nutrient needs related to wound healing as evidenced by estimated needs.    GOAL:   Patient will meet greater than or equal to 90% of their needs    MONITOR:    (Energy intake)  REASON FOR ASSESSMENT:    (pressure ulcer stage III noted)    ASSESSMENT:      Pt admitted with UTI  Past Medical History  Diagnosis Date  . Allergy   . GERD (gastroesophageal reflux disease)   . MS (multiple sclerosis) (HCC)   . Hypertension   . Decubitus ulcer     Current Nutrition: ate 100% of breakfast so did want much lunch per pt. Reports drinking ensure  Food/Nutrition-Related History: normal intake prior to admission, likes ensure   Scheduled Medications:  . amLODipine  5 mg Oral Daily  . atorvastatin  40 mg Oral QPC supper  . cefTRIAXone (ROCEPHIN)  IV  1 g Intravenous Q24H  . cholecalciferol  1,000 Units Oral Daily  . diazepam  5 mg Oral Q12H  . docusate sodium  100 mg Oral BID  . enoxaparin (LOVENOX) injection  40 mg Subcutaneous Q24H  . feeding supplement (ENSURE ENLIVE)  237 mL Oral BID WC  . FLUoxetine  20 mg Oral Daily  . gabapentin  900 mg Oral TID  . lisinopril  10 mg Oral Daily  . metoprolol succinate  25 mg Oral Daily  . pantoprazole  40 mg Oral Daily  . saccharomyces boulardii  250 mg Oral BID       Electrolyte/Renal Profile and Glucose Profile:   Recent Labs Lab 01/29/15 0500 01/30/15 0737  NA 137 139  K 4.0 4.1  CL 104 110  CO2 27 24  BUN 32* 32*  CREATININE 1.64* 1.12  CALCIUM 9.1 8.5*  GLUCOSE 156* 111*   Protein Profile:  Recent Labs Lab 01/29/15 0500  ALBUMIN 3.9    Gastrointestinal Profile: Last BM:1/5     Weight Change: stable wt    Diet Order:  Diet 2 gram sodium  Room service appropriate?: Yes; Fluid consistency:: Thin  Skin:   pressure ulcer stage III   Height:   Ht Readings from Last 1 Encounters:  01/29/15  (1.778 m)    Weight:   Wt Readings from Last 1 Encounters:  01/29/15 207 lb (93.895 kg)    Ideal Body Weight:     BMI:  Body mass index is 29.7 kg/(m^2).  Estimated Nutritional Needs:   Kcal:  BEE 1786 kcals (IF 1.0-1.2, AF 1.2) 1610-9604 kcals/d  Protein:  (1.2-1.5 g/kg) 113-141 g/d  Fluid:  (25-78ml/kg) 2350-2818ml/d  EDUCATION NEEDS:   No education needs identified at this time  MODERATE Care Level  Symphonie Schneiderman B. Freida Busman, RD, LDN [phone] (pager) Weekend/On-Call pager [phone])

## 2015-01-30 NOTE — Care Management Obs Status (Signed)
MEDICARE OBSERVATION STATUS NOTIFICATION   Patient Details  Name: Travis Cantrell MRN: [id number] Date of Birth: 1960/06/16   Medicare Observation Status Notification Given:  Yes    Adonis Huguenin, RN 01/30/2015, 10:42 AM

## 2015-01-31 DIAGNOSIS — R6510 Systemic inflammatory response syndrome (SIRS) of non-infectious origin without acute organ dysfunction: Secondary | ICD-10-CM | POA: Diagnosis not present

## 2015-01-31 DIAGNOSIS — G8220 Paraplegia, unspecified: Secondary | ICD-10-CM | POA: Diagnosis not present

## 2015-01-31 DIAGNOSIS — G35 Multiple sclerosis: Secondary | ICD-10-CM | POA: Diagnosis not present

## 2015-01-31 DIAGNOSIS — N390 Urinary tract infection, site not specified: Secondary | ICD-10-CM | POA: Diagnosis not present

## 2015-01-31 DIAGNOSIS — R109 Unspecified abdominal pain: Secondary | ICD-10-CM | POA: Diagnosis not present

## 2015-01-31 LAB — URINE CULTURE

## 2015-01-31 MED ORDER — CEPHALEXIN 500 MG PO CAPS
500.0000 mg | ORAL_CAPSULE | Freq: Two times a day (BID) | ORAL | Status: DC
  Administered 2015-01-31 – 2015-02-02 (×5): 500 mg via ORAL
  Filled 2015-01-31 (×5): qty 1

## 2015-01-31 NOTE — Progress Notes (Signed)
[facility] - [place] at [facility]   PATIENT NAME: Travis Cantrell    MR#:  [id number]  DATE OF BIRTH:  1961/01/23  SUBJECTIVE:  No complaints  REVIEW OF SYSTEMS:   Review of Systems  Constitutional: Negative for fever, chills and weight loss.  HENT: Negative for ear discharge, ear pain and nosebleeds.   Eyes: Negative for blurred vision, pain and discharge.  Respiratory: Negative for sputum production, shortness of breath, wheezing and stridor.   Cardiovascular: Negative for chest pain, palpitations, orthopnea and PND.  Gastrointestinal: Negative for nausea, vomiting, abdominal pain and diarrhea.  Genitourinary: Negative for urgency and frequency.  Musculoskeletal: Negative for back pain and joint pain.  Neurological: Negative for sensory change, speech change, focal weakness and weakness.  Psychiatric/Behavioral: Negative for depression and hallucinations. The patient is not nervous/anxious.   All other systems reviewed and are negative.  Tolerating Diet:yes Tolerating PT: no due to MS  DRUG ALLERGIES:   Allergies  Allergen Reactions  . Baclofen Diarrhea    VITALS:  Blood pressure 154/95, pulse 58, temperature 97.8 F (36.6 C), temperature source Oral, resp. rate 20, height  (1.778 m), weight 93.895 kg (207 lb), SpO2 100 %.  PHYSICAL EXAMINATION:   Physical Exam  GENERAL:  55 y.o.-year-old patient lying in the bed with no acute distress.  EYES: Pupils equal, round, reactive to light and accommodation. No scleral icterus. Extraocular muscles intact.  HEENT: Head atraumatic, normocephalic. Oropharynx and nasopharynx clear.  NECK:  Supple, no jugular venous distention. No thyroid enlargement, no tenderness.  LUNGS: Normal breath sounds bilaterally, no wheezing, rales, rhonchi. No use of accessory muscles of respiration.  CARDIOVASCULAR: S1, S2 normal. No murmurs, rubs, or gallops.  ABDOMEN: Soft, nontender, nondistended. Bowel sounds present.  No organomegaly or mass. Chronic indwelling foley EXTREMITIES: No cyanosis, clubbing or edema b/l.    NEUROLOGIC: Cranial nerves II through XII are intact. No focal Motor or sensory deficits b/l.   PSYCHIATRIC:  patient is alert and oriented x 3.  SKIN: No obvious rash, lesion, or ulcer.    LABORATORY PANEL:   CBC  Recent Labs Lab 01/30/15 0737  WBC 13.8*  HGB 12.2*  HCT 38.0*  PLT 261    Chemistries   Recent Labs Lab 01/29/15 0500 01/30/15 0737  NA 137 139  K 4.0 4.1  CL 104 110  CO2 27 24  GLUCOSE 156* 111*  BUN 32* 32*  CREATININE 1.64* 1.12  CALCIUM 9.1 8.5*  AST 15  --   ALT 17  --   ALKPHOS 100  --   BILITOT 1.0  --    ASSESSMENT AND PLAN:   55 year old male patient is to of multiple sclerosis, GERD, hypertension, chronic indwelling Foley catheter presented to the emergency room with lower abdominal pain.  1 abdominal pain secondary to UTI Clinically better. Pain management as needed  2 urinary tract infection secondary to gram-negative rods Continue IV Rocephin--->change to po keflex urine culture has revealed greater than 100,000 colonies of proteus and sensitivity to cephalosporins  3 . Dehydration/AKI -pre renal Resolved D/c IVF  4. multiple sclerosis Symptomatic treatment  5. decubitus ulcer Wound care consult Pain mannagement   Pt is ready for d/c to home but with th snow strom unable to send him out since EMS is not available and pt's disability Zenaida Niece is not functioning according to him  Case discussed with Care Management/Social Worker. Management plans discussed with the patient, family and they are in agreement.  CODE  STATUS: full  DVT Prophylaxis: lovenox  TOTAL TIME TAKING CARE OF THIS PATIENT: 30 minutes.  >50% time spent on counselling and coordination of care  Ghalia Reicks M.D on 01/31/2015 at 9:40 AM  Between 7am to 6pm - Pager - [phone]  After 6pm go to [email or web address] - password [email or web address] [facility]  [place]  [facility]  Office  [phone]  CC: Primary care physician; Kerby Nora, MD

## 2015-02-01 DIAGNOSIS — G35 Multiple sclerosis: Secondary | ICD-10-CM | POA: Diagnosis not present

## 2015-02-01 DIAGNOSIS — G8220 Paraplegia, unspecified: Secondary | ICD-10-CM | POA: Diagnosis not present

## 2015-02-01 DIAGNOSIS — R109 Unspecified abdominal pain: Secondary | ICD-10-CM | POA: Diagnosis not present

## 2015-02-01 DIAGNOSIS — N390 Urinary tract infection, site not specified: Secondary | ICD-10-CM | POA: Diagnosis not present

## 2015-02-01 NOTE — Progress Notes (Signed)
[facility] Physicians - [place] at [facility]   PATIENT NAME: Yahshua Thibault    MR#:  [id number]  DATE OF BIRTH:  01-19-1961  SUBJECTIVE:  No complaints  REVIEW OF SYSTEMS:   Review of Systems  Constitutional: Negative for fever, chills and weight loss.  HENT: Negative for ear discharge, ear pain and nosebleeds.   Eyes: Negative for blurred vision, pain and discharge.  Respiratory: Negative for sputum production, shortness of breath, wheezing and stridor.   Cardiovascular: Negative for chest pain, palpitations, orthopnea and PND.  Gastrointestinal: Negative for nausea, vomiting, abdominal pain and diarrhea.  Genitourinary: Negative for urgency and frequency.  Musculoskeletal: Negative for back pain and joint pain.  Neurological: Negative for sensory change, speech change, focal weakness and weakness.  Psychiatric/Behavioral: Negative for depression and hallucinations. The patient is not nervous/anxious.   All other systems reviewed and are negative.  Tolerating Diet:yes Tolerating PT: no due to MS  DRUG ALLERGIES:   Allergies  Allergen Reactions  . Baclofen Diarrhea    VITALS:  Blood pressure 129/82, pulse 65, temperature 97.7 F (36.5 C), temperature source Oral, resp. rate 18, height  (1.778 m), weight 93.895 kg (207 lb), SpO2 100 %.  PHYSICAL EXAMINATION:   Physical Exam  GENERAL:  55 y.o.-year-old patient lying in the bed with no acute distress.  EYES: Pupils equal, round, reactive to light and accommodation. No scleral icterus. Extraocular muscles intact.  HEENT: Head atraumatic, normocephalic. Oropharynx and nasopharynx clear.  NECK:  Supple, no jugular venous distention. No thyroid enlargement, no tenderness.  LUNGS: Normal breath sounds bilaterally, no wheezing, rales, rhonchi. No use of accessory muscles of respiration.  CARDIOVASCULAR: S1, S2 normal. No murmurs, rubs, or gallops.  ABDOMEN: Soft, nontender, nondistended. Bowel sounds present.  No organomegaly or mass. Chronic indwelling foley EXTREMITIES: No cyanosis, clubbing or edema b/l.    NEUROLOGIC: Cranial nerves II through XII are intact. No focal Motor or sensory deficits b/l.   PSYCHIATRIC:  patient is alert and oriented x 3.  SKIN: No obvious rash, lesion, or ulcer.    LABORATORY PANEL:   CBC  Recent Labs Lab 01/30/15 0737  WBC 13.8*  HGB 12.2*  HCT 38.0*  PLT 261    Chemistries   Recent Labs Lab 01/29/15 0500 01/30/15 0737  NA 137 139  K 4.0 4.1  CL 104 110  CO2 27 24  GLUCOSE 156* 111*  BUN 32* 32*  CREATININE 1.64* 1.12  CALCIUM 9.1 8.5*  AST 15  --   ALT 17  --   ALKPHOS 100  --   BILITOT 1.0  --    ASSESSMENT AND PLAN:   55 year old male patient is to of multiple sclerosis, GERD, hypertension, chronic indwelling Foley catheter presented to the emergency room with lower abdominal pain.  1 abdominal pain secondary to UTI Clinically better. Pain management as needed  2 urinary tract infection secondary to gram-negative rods Continue IV Rocephin--->change to po keflex urine culture has revealed greater than 100,000 colonies of proteus and sensitivity to cephalosporins  3 . Dehydration/AKI -pre renal Resolved D/c IVF  4. multiple sclerosis Symptomatic treatment  5. decubitus ulcer Wound care consult Pain mannagement  Pt is ready for d/c to home but with th snow strom unable to send him out since EMS is not available and pt's disability Zenaida Niece is not functioning according to him  Case discussed with Care Management/Social Worker. Management plans discussed with the patient, family and they are in agreement.  CODE STATUS:  full  DVT Prophylaxis: lovenox  TOTAL TIME TAKING CARE OF THIS PATIENT: 30 minutes.  >50% time spent on counselling and coordination of care  Vernal Hritz M.D on 02/01/2015 at 12:37 PM  Between 7am to 6pm - Pager - [phone]  After 6pm go to [email or web address] - password [email or web address]  [place]  Hospital[facility]  Office  [phone]  CC: Primary care physician; Kerby Nora, MD

## 2015-02-02 DIAGNOSIS — G8220 Paraplegia, unspecified: Secondary | ICD-10-CM | POA: Diagnosis not present

## 2015-02-02 DIAGNOSIS — R109 Unspecified abdominal pain: Secondary | ICD-10-CM | POA: Diagnosis not present

## 2015-02-02 DIAGNOSIS — N390 Urinary tract infection, site not specified: Secondary | ICD-10-CM | POA: Diagnosis not present

## 2015-02-02 DIAGNOSIS — M6281 Muscle weakness (generalized): Secondary | ICD-10-CM | POA: Diagnosis not present

## 2015-02-02 DIAGNOSIS — R6510 Systemic inflammatory response syndrome (SIRS) of non-infectious origin without acute organ dysfunction: Secondary | ICD-10-CM | POA: Diagnosis not present

## 2015-02-02 MED ORDER — CEPHALEXIN 500 MG PO CAPS
500.0000 mg | ORAL_CAPSULE | Freq: Two times a day (BID) | ORAL | Status: DC
Start: 2015-02-02 — End: 2015-02-18

## 2015-02-02 NOTE — Discharge Summary (Signed)
[facility] Physicians - [place] at [facility]   PATIENT NAME: Travis Cantrell    MR#:  [id number]  DATE OF BIRTH:  October 27, 1960  DATE OF ADMISSION:  01/29/2015 ADMITTING PHYSICIAN: Ihor Austin, MD  DATE OF DISCHARGE: 02/02/15  PRIMARY CARE PHYSICIAN: Kerby Nora, MD    ADMISSION DIAGNOSIS:  SIRS (systemic inflammatory response syndrome) (HCC) [R65.10] Urinary tract infection associated with catheterization of urinary tract, initial encounter [T83.51XA, N39.0]  DISCHARGE DIAGNOSIS:  SIRS due to recurrent UTI due to chronic foley Paraplegia Chronic decubitus SECONDARY DIAGNOSIS:   Past Medical History  Diagnosis Date  . Allergy   . GERD (gastroesophageal reflux disease)   . MS (multiple sclerosis) (HCC)   . Hypertension   . Decubitus ulcer     HOSPITAL COURSE:  55 year old male patient is to of multiple sclerosis, GERD, hypertension, chronic indwelling Foley catheter presented to the emergency room with lower abdominal pain.  1 abdominal pain secondary to UTI Clinically better. Pain management as needed  2 urinary tract infection secondary to Proteus UTI in the setting of chronic foley Continue IV Rocephin--->change to po keflex for 10 days urine culture has revealed greater than 100,000 colonies of proteus and sensitivity to cephalosporins  3 . Dehydration/AKI -pre renal Resolved D/c IVF  4. multiple sclerosis Symptomatic treatment  5. decubitus ulcer Wound care consult done. Pt will f/u as outpt  D/c home today  CONSULTS OBTAINED:     DRUG ALLERGIES:   Allergies  Allergen Reactions  . Baclofen Diarrhea    DISCHARGE MEDICATIONS:   Current Discharge Medication List    START taking these medications   Details  cephALEXin (KEFLEX) 500 MG capsule Take 1 capsule (500 mg total) by mouth every 12 (twelve) hours. Qty: 10 capsule, Refills: 0      CONTINUE these medications which have NOT CHANGED   Details  acetaminophen (TYLENOL) 325 MG  tablet Take 2 tablets (650 mg total) by mouth every 6 (six) hours as needed for mild pain (or Fever >/= 101).    amLODipine (NORVASC) 5 MG tablet Take 1 tablet (5 mg total) by mouth daily. Qty: 30 tablet, Refills: 0    atorvastatin (LIPITOR) 40 MG tablet Take 1 tablet (40 mg total) by mouth daily. Qty: 90 tablet, Refills: 1    Cholecalciferol (VITAMIN D-3 PO) Take 1 tablet by mouth daily.     diazepam (VALIUM) 5 MG tablet TAKE ONE TABLET BY MOUTH EVERY 12 HOURS Qty: 30 tablet, Refills: 0    docusate sodium (COLACE) 100 MG capsule Take 1 capsule (100 mg total) by mouth 2 (two) times daily. Qty: 10 capsule, Refills: 0    feeding supplement, ENSURE ENLIVE, (ENSURE ENLIVE) LIQD Take 237 mLs by mouth 2 (two) times daily with a meal. Qty: 237 mL, Refills: 12    FLUoxetine (PROZAC) 20 MG capsule TAKE TWO CAPSULES BY MOUTH IN THE MORNING AND ONE IN THE EVENING Qty: 90 capsule, Refills: 5    gabapentin (NEURONTIN) 300 MG capsule Take 3 capsules (900 mg total) by mouth 3 (three) times daily. Qty: 360 capsule, Refills: 0    ibuprofen (ADVIL,MOTRIN) 800 MG tablet TAKE ONE TABLET BY MOUTH EVERY 8 HOURS AS NEEDED Qty: 90 tablet, Refills: 0    lisinopril (PRINIVIL,ZESTRIL) 10 MG tablet TAKE ONE TABLET BY MOUTH ONCE DAILY Qty: 30 tablet, Refills: 5    metoprolol succinate (TOPROL-XL) 25 MG 24 hr tablet Take 25 mg by mouth daily.     omeprazole (PRILOSEC) 20 MG capsule TAKE  ONE CAPSULE BY MOUTH TWICE DAILY Qty: 60 capsule, Refills: 5    oxybutynin (DITROPAN) 5 MG tablet Take 1 tablet (5 mg total) by mouth every 8 (eight) hours as needed for bladder spasms. Qty: 90 tablet, Refills: 3    saccharomyces boulardii (FLORASTOR) 250 MG capsule Take 1 capsule (250 mg total) by mouth 2 (two) times daily. Qty: 20 capsule, Refills: 0        If you experience worsening of your admission symptoms, develop shortness of breath, life threatening emergency, suicidal or homicidal thoughts you must seek  medical attention immediately by calling 911 or calling your MD immediately  if symptoms less severe.  You Must read complete instructions/literature along with all the possible adverse reactions/side effects for all the Medicines you take and that have been prescribed to you. Take any new Medicines after you have completely understood and accept all the possible adverse reactions/side effects.   Please note  You were cared for by a hospitalist during your hospital stay. If you have any questions about your discharge medications or the care you received while you were in the hospital after you are discharged, you can call the unit and asked to speak with the hospitalist on call if the hospitalist that took care of you is not available. Once you are discharged, your primary care physician will handle any further medical issues. Please note that NO REFILLS for any discharge medications will be authorized once you are discharged, as it is imperative that you return to your primary care physician (or establish a relationship with a primary care physician if you do not have one) for your aftercare needs so that they can reassess your need for medications and monitor your lab values. Today   SUBJECTIVE   Doing well  VITAL SIGNS:  Blood pressure 104/71, pulse 50, temperature 97.7 F (36.5 C), temperature source Oral, resp. rate 16, height 5\' 10"  (1.778 m), weight 93.895 kg (207 lb), SpO2 96 %.  I/O:   Intake/Output Summary (Last 24 hours) at 02/02/15 1037 Last data filed at 02/02/15 0900  Gross per 24 hour  Intake    480 ml  Output   1200 ml  Net   -720 ml    PHYSICAL EXAMINATION:  GENERAL:  55 y.o.-year-old patient lying in the bed with no acute distress.  EYES: Pupils equal, round, reactive to light and accommodation. No scleral icterus. Extraocular muscles intact.  HEENT: Head atraumatic, normocephalic. Oropharynx and nasopharynx clear.  NECK:  Supple, no jugular venous distention. No  thyroid enlargement, no tenderness.  LUNGS: Normal breath sounds bilaterally, no wheezing, rales,rhonchi or crepitation. No use of accessory muscles of respiration.  CARDIOVASCULAR: S1, S2 normal. No murmurs, rubs, or gallops.  ABDOMEN: Soft, non-tender, non-distended. Bowel sounds present. No organomegaly or mass. Chronic foley present EXTREMITIES: No pedal edema, cyanosis, or clubbing. Chronic contracture and skin changes due to paraplegia NEUROLOGIC: Cranial nerves II through XII are intact. Muscle strength 5/5 in all extremities. Sensation intact. Gait not checked.  PSYCHIATRIC: The patient is alert and oriented x 3.  SKIN: chronic decubitus  DATA REVIEW:   CBC   Recent Labs Lab 01/30/15 0737  WBC 13.8*  HGB 12.2*  HCT 38.0*  PLT 261    Chemistries   Recent Labs Lab 01/29/15 0500 01/30/15 0737  NA 137 139  K 4.0 4.1  CL 104 110  CO2 27 24  GLUCOSE 156* 111*  BUN 32* 32*  CREATININE 1.64* 1.12  CALCIUM 9.1 8.5*  AST 15  --   ALT 17  --   ALKPHOS 100  --   BILITOT 1.0  --     Microbiology Results   Recent Results (from the past 240 hour(s))  Blood culture (routine x 2)     Status: None (Preliminary result)   Collection Time: 01/29/15  5:30 AM  Result Value Ref Range Status   Specimen Description BLOOD LEFT AC  Final   Special Requests   Final    BOTTLES DRAWN AEROBIC AND ANAEROBIC ANAEROBIC AEROBIC   Culture NO GROWTH 3 DAYS  Final   Report Status PENDING  Incomplete  Blood culture (routine x 2)     Status: None (Preliminary result)   Collection Time: 01/29/15  5:40 AM  Result Value Ref Range Status   Specimen Description BLOOD RIGHT AC  Final   Special Requests   Final    BOTTLES DRAWN AEROBIC AND ANAEROBIC ANAEROBIC AEROBIC   Culture NO GROWTH 3 DAYS  Final   Report Status PENDING  Incomplete  Urine culture     Status: None   Collection Time: 01/29/15  5:43 AM  Result Value Ref Range Status   Specimen Description URINE,  CATHETERIZED  Final   Special Requests NONE  Final   Culture >=100,000 COLONIES/mL PROTEUS MIRABILIS  Final   Report Status 01/31/2015 FINAL  Final   Organism ID, Bacteria PROTEUS MIRABILIS  Final      Susceptibility   Proteus mirabilis - MIC*    AMPICILLIN 16 INTERMEDIATE Intermediate     CEFTAZIDIME <=1 SENSITIVE Sensitive     CEFAZOLIN 8 SENSITIVE Sensitive     CEFTRIAXONE <=1 SENSITIVE Sensitive     CIPROFLOXACIN >=4 RESISTANT Resistant     GENTAMICIN <=1 SENSITIVE Sensitive     IMIPENEM >=16 RESISTANT Resistant     TRIMETH/SULFA 160 RESISTANT Resistant     PIP/TAZO Value in next row Sensitive      SENSITIVE<=4    * >=100,000 COLONIES/mL PROTEUS MIRABILIS  MRSA PCR Screening     Status: None   Collection Time: 01/29/15 12:50 PM  Result Value Ref Range Status   MRSA by PCR NEGATIVE NEGATIVE Final    Comment:        The GeneXpert MRSA Assay (FDA approved for NASAL specimens only), is one component of a comprehensive MRSA colonization surveillance program. It is not intended to diagnose MRSA infection nor to guide or monitor treatment for MRSA infections.     RADIOLOGY:  No results found.   Management plans discussed with the patient, family and they are in agreement.  CODE STATUS:     Code Status Orders        Start     Ordered   01/29/15 0947  Full code   Continuous     01/29/15 0946      TOTAL TIME TAKING CARE OF THIS PATIENT: 40 minutes.    Malaysia Crance M.D on 02/02/2015 at 10:37 AM  Between 7am to 6pm - Pager - [phone] After 6pm go to [email or web address] - password [email or web address] [facility]  [place] [facility]  Office  [phone]  CC: Primary care physician; Kerby Nora, MD

## 2015-02-02 NOTE — Care Management (Signed)
Patient admitted from home with abdominal pain related to UTI.  Patient lives at home with a "friend" who hee has pays to provide 24 hours care in the home.  Patient states that he is bed bound and his friend helps with ADL's.  Patient has a WC, hoyer lift, hospital bed, and air mattress overlay in the home.  Patient states that has 2 sisters that live locally in [place].  Patient states that his friends transports him to the Dr.Office.  Medications are obtained from [facility] on [address]  Plan for patient to be discharged today.  MD to place order for home Health RN and SW.  Patient has selected [facility].  Barbara Cower from [facility] notified of referral.  RNCM signing off

## 2015-02-02 NOTE — Progress Notes (Signed)
Spoke with [employer], [employer] rep at [phone], to notify of non-emergent EMS transport.  Auth notification reference given as [id number].   Service date range good from 02/12/15 - 05/03/15.   Gap exception requested to determine if services can be considered at an in-network level.

## 2015-02-02 NOTE — Progress Notes (Signed)
Pt discharged to home. Transported via EMS. Concerns addressed. VSS. IV site removed. Foley intact.

## 2015-02-02 NOTE — Consult Note (Signed)
WOC wound follow up Wound type:Stage 3 pressure injury to coccyx and sacrum, resolving well.  Fully granulated at this time.  Measurement: 1.5 cm x 1 cm x 0.1 cm Left buttocks 0.5 cm scattered nonintact lesions, resolving.  Interdry AG and prompt skin care has resolved the erythema to groin and penile region.  Anticipating discharge home today.  Encouraged turning and repositioning self often to offload pressure. Explained that even a "micro shift" in position can offload and prevent pressure breakdown.  Encouraged to keep perineal area clean and dry.  Wound LZJ:QBHA and moist Drainage (amount, consistency, odor) Scant serous Periwound:Intact Dressing procedure/placement/frequency: WIll discharge home with Allevyn sacral dressing.  That should encourage healing of the sacral area if patient continues to offload.  Keep perineum clean and dry.  Will not follow at this time.  Please re-consult if needed.  Maple Hudson RN BSN CWON Pager [phone]

## 2015-02-03 ENCOUNTER — Telehealth: Payer: Self-pay | Admitting: *Deleted

## 2015-02-03 ENCOUNTER — Telehealth: Payer: Self-pay

## 2015-02-03 DIAGNOSIS — R6510 Systemic inflammatory response syndrome (SIRS) of non-infectious origin without acute organ dysfunction: Secondary | ICD-10-CM | POA: Diagnosis not present

## 2015-02-03 DIAGNOSIS — Z436 Encounter for attention to other artificial openings of urinary tract: Secondary | ICD-10-CM | POA: Diagnosis not present

## 2015-02-03 DIAGNOSIS — G35 Multiple sclerosis: Secondary | ICD-10-CM | POA: Diagnosis not present

## 2015-02-03 DIAGNOSIS — N390 Urinary tract infection, site not specified: Secondary | ICD-10-CM | POA: Diagnosis not present

## 2015-02-03 DIAGNOSIS — L89322 Pressure ulcer of left buttock, stage 2: Secondary | ICD-10-CM | POA: Diagnosis not present

## 2015-02-03 DIAGNOSIS — L89312 Pressure ulcer of right buttock, stage 2: Secondary | ICD-10-CM | POA: Diagnosis not present

## 2015-02-03 DIAGNOSIS — G8220 Paraplegia, unspecified: Secondary | ICD-10-CM | POA: Diagnosis not present

## 2015-02-03 DIAGNOSIS — I10 Essential (primary) hypertension: Secondary | ICD-10-CM | POA: Diagnosis not present

## 2015-02-03 LAB — CULTURE, BLOOD (ROUTINE X 2)
CULTURE: NO GROWTH
Culture: NO GROWTH

## 2015-02-03 NOTE — Telephone Encounter (Signed)
Transition Care Management Follow-up Telephone Call   Date discharged? 02/02/15   How have you been since you were released from the hospital? Doing well.   Do you understand why you were in the hospital? yes   Do you understand the discharge instructions? yes   Where were you discharged to? home   Items Reviewed:  Medications reviewed: yes  Allergies reviewed: yes  Dietary changes reviewed: no  Referrals reviewed: home health   Functional Questionnaire:   Activities of Daily Living (ADLs):   He states they are independent in the following: bathing and hygiene, feeding, grooming and dressing States they require assistance with the following: ambulation and continence   Any transportation issues/concerns?: yes, will arrange transportation prior to appointment   Any patient concerns? no   Confirmed importance and date/time of follow-up visits scheduled yes, 02/06/15 @ 1115  Provider Appointment booked with Kerby Nora, MD  Confirmed with patient if condition begins to worsen call PCP or go to the E/R.  Patient was given the office number and encouraged to call back with question or concerns.  : yes

## 2015-02-03 NOTE — Telephone Encounter (Signed)
Mardella Layman nurse with [facility] left v/m requesting specific wound care orders and verbal order to take care of foley catheter while home health is in the home.

## 2015-02-03 NOTE — Telephone Encounter (Signed)
Okay to given verbal order to care for foley. As far as wound care orders.. I have not yet seen pt. Recs given from hospital?

## 2015-02-04 NOTE — Telephone Encounter (Signed)
Left message for Travis Cantrell given verbal okay to care for foley catheter while HH is in the home.  Advised we have not seen Travis Cantrell since he was discharged from the hospital so Dr. Ermalene Searing is unable to give specific wound care orders.  Travis Cantrell has a hospital follow up here on Friday as well as a New Patient appointment at [facility].

## 2015-02-06 ENCOUNTER — Ambulatory Visit: Payer: Medicare Other | Admitting: Surgery

## 2015-02-06 ENCOUNTER — Ambulatory Visit: Payer: Medicare Other | Admitting: Family Medicine

## 2015-02-06 DIAGNOSIS — Z436 Encounter for attention to other artificial openings of urinary tract: Secondary | ICD-10-CM | POA: Diagnosis not present

## 2015-02-06 DIAGNOSIS — G35 Multiple sclerosis: Secondary | ICD-10-CM | POA: Diagnosis not present

## 2015-02-06 DIAGNOSIS — L89312 Pressure ulcer of right buttock, stage 2: Secondary | ICD-10-CM | POA: Diagnosis not present

## 2015-02-06 DIAGNOSIS — G8220 Paraplegia, unspecified: Secondary | ICD-10-CM | POA: Diagnosis not present

## 2015-02-06 DIAGNOSIS — N390 Urinary tract infection, site not specified: Secondary | ICD-10-CM | POA: Diagnosis not present

## 2015-02-06 DIAGNOSIS — R6510 Systemic inflammatory response syndrome (SIRS) of non-infectious origin without acute organ dysfunction: Secondary | ICD-10-CM | POA: Diagnosis not present

## 2015-02-06 DIAGNOSIS — I10 Essential (primary) hypertension: Secondary | ICD-10-CM | POA: Diagnosis not present

## 2015-02-06 DIAGNOSIS — L89322 Pressure ulcer of left buttock, stage 2: Secondary | ICD-10-CM | POA: Diagnosis not present

## 2015-02-12 ENCOUNTER — Emergency Department: Payer: Medicare Other

## 2015-02-12 ENCOUNTER — Inpatient Hospital Stay
Admit: 2015-02-12 | Discharge: 2015-02-12 | Disposition: A | Discharge status: Home / Self Care | Payer: Medicare Other | Attending: Cardiovascular Disease | Admitting: Cardiovascular Disease

## 2015-02-12 ENCOUNTER — Inpatient Hospital Stay: Payer: Medicare Other

## 2015-02-12 ENCOUNTER — Inpatient Hospital Stay
Admission: EM | Admit: 2015-02-12 | Discharge: 2015-02-18 | DRG: 871 | Disposition: A | Discharge status: Home Health | Payer: Medicare Other | Attending: Internal Medicine | Admitting: Internal Medicine

## 2015-02-12 DIAGNOSIS — Z8614 Personal history of Methicillin resistant Staphylococcus aureus infection: Secondary | ICD-10-CM

## 2015-02-12 DIAGNOSIS — R402431 Glasgow coma scale score 3-8, in the field [EMT or ambulance]: Secondary | ICD-10-CM | POA: Diagnosis not present

## 2015-02-12 DIAGNOSIS — A4152 Sepsis due to Pseudomonas: Secondary | ICD-10-CM | POA: Diagnosis not present

## 2015-02-12 DIAGNOSIS — Z833 Family history of diabetes mellitus: Secondary | ICD-10-CM | POA: Diagnosis not present

## 2015-02-12 DIAGNOSIS — N319 Neuromuscular dysfunction of bladder, unspecified: Secondary | ICD-10-CM | POA: Diagnosis not present

## 2015-02-12 DIAGNOSIS — G8220 Paraplegia, unspecified: Secondary | ICD-10-CM | POA: Diagnosis present

## 2015-02-12 DIAGNOSIS — R7989 Other specified abnormal findings of blood chemistry: Secondary | ICD-10-CM | POA: Diagnosis not present

## 2015-02-12 DIAGNOSIS — T83511A Infection and inflammatory reaction due to indwelling urethral catheter, initial encounter: Secondary | ICD-10-CM

## 2015-02-12 DIAGNOSIS — E876 Hypokalemia: Secondary | ICD-10-CM | POA: Diagnosis not present

## 2015-02-12 DIAGNOSIS — L89312 Pressure ulcer of right buttock, stage 2: Secondary | ICD-10-CM | POA: Diagnosis not present

## 2015-02-12 DIAGNOSIS — Z7401 Bed confinement status: Secondary | ICD-10-CM

## 2015-02-12 DIAGNOSIS — N390 Urinary tract infection, site not specified: Secondary | ICD-10-CM

## 2015-02-12 DIAGNOSIS — I110 Hypertensive heart disease with heart failure: Secondary | ICD-10-CM | POA: Diagnosis present

## 2015-02-12 DIAGNOSIS — Z87440 Personal history of urinary (tract) infections: Secondary | ICD-10-CM

## 2015-02-12 DIAGNOSIS — L89322 Pressure ulcer of left buttock, stage 2: Secondary | ICD-10-CM | POA: Diagnosis not present

## 2015-02-12 DIAGNOSIS — M6281 Muscle weakness (generalized): Secondary | ICD-10-CM | POA: Diagnosis not present

## 2015-02-12 DIAGNOSIS — Z807 Family history of other malignant neoplasms of lymphoid, hematopoietic and related tissues: Secondary | ICD-10-CM | POA: Diagnosis not present

## 2015-02-12 DIAGNOSIS — G35 Multiple sclerosis: Secondary | ICD-10-CM | POA: Diagnosis not present

## 2015-02-12 DIAGNOSIS — L89309 Pressure ulcer of unspecified buttock, unspecified stage: Secondary | ICD-10-CM | POA: Diagnosis not present

## 2015-02-12 DIAGNOSIS — R6510 Systemic inflammatory response syndrome (SIRS) of non-infectious origin without acute organ dysfunction: Secondary | ICD-10-CM | POA: Diagnosis not present

## 2015-02-12 DIAGNOSIS — R55 Syncope and collapse: Secondary | ICD-10-CM | POA: Diagnosis not present

## 2015-02-12 DIAGNOSIS — B964 Proteus (mirabilis) (morganii) as the cause of diseases classified elsewhere: Secondary | ICD-10-CM | POA: Diagnosis present

## 2015-02-12 DIAGNOSIS — J9601 Acute respiratory failure with hypoxia: Secondary | ICD-10-CM | POA: Diagnosis present

## 2015-02-12 DIAGNOSIS — J9690 Respiratory failure, unspecified, unspecified whether with hypoxia or hypercapnia: Secondary | ICD-10-CM | POA: Diagnosis present

## 2015-02-12 DIAGNOSIS — Z8669 Personal history of other diseases of the nervous system and sense organs: Secondary | ICD-10-CM

## 2015-02-12 DIAGNOSIS — R6520 Severe sepsis without septic shock: Secondary | ICD-10-CM | POA: Diagnosis not present

## 2015-02-12 DIAGNOSIS — R32 Unspecified urinary incontinence: Secondary | ICD-10-CM | POA: Diagnosis present

## 2015-02-12 DIAGNOSIS — L8992 Pressure ulcer of unspecified site, stage 2: Secondary | ICD-10-CM | POA: Diagnosis not present

## 2015-02-12 DIAGNOSIS — J189 Pneumonia, unspecified organism: Secondary | ICD-10-CM | POA: Diagnosis not present

## 2015-02-12 DIAGNOSIS — Z8249 Family history of ischemic heart disease and other diseases of the circulatory system: Secondary | ICD-10-CM | POA: Diagnosis not present

## 2015-02-12 DIAGNOSIS — N179 Acute kidney failure, unspecified: Secondary | ICD-10-CM

## 2015-02-12 DIAGNOSIS — R6521 Severe sepsis with septic shock: Secondary | ICD-10-CM | POA: Diagnosis not present

## 2015-02-12 DIAGNOSIS — A419 Sepsis, unspecified organism: Principal | ICD-10-CM | POA: Diagnosis present

## 2015-02-12 DIAGNOSIS — N170 Acute kidney failure with tubular necrosis: Secondary | ICD-10-CM | POA: Diagnosis present

## 2015-02-12 DIAGNOSIS — R739 Hyperglycemia, unspecified: Secondary | ICD-10-CM | POA: Diagnosis not present

## 2015-02-12 DIAGNOSIS — Z4659 Encounter for fitting and adjustment of other gastrointestinal appliance and device: Secondary | ICD-10-CM

## 2015-02-12 DIAGNOSIS — I5030 Unspecified diastolic (congestive) heart failure: Secondary | ICD-10-CM | POA: Diagnosis present

## 2015-02-12 DIAGNOSIS — R0600 Dyspnea, unspecified: Secondary | ICD-10-CM

## 2015-02-12 DIAGNOSIS — J9600 Acute respiratory failure, unspecified whether with hypoxia or hypercapnia: Secondary | ICD-10-CM | POA: Diagnosis not present

## 2015-02-12 DIAGNOSIS — Z79899 Other long term (current) drug therapy: Secondary | ICD-10-CM

## 2015-02-12 DIAGNOSIS — R0602 Shortness of breath: Secondary | ICD-10-CM | POA: Diagnosis not present

## 2015-02-12 DIAGNOSIS — R778 Other specified abnormalities of plasma proteins: Secondary | ICD-10-CM

## 2015-02-12 DIAGNOSIS — Q649 Congenital malformation of urinary system, unspecified: Secondary | ICD-10-CM | POA: Diagnosis not present

## 2015-02-12 DIAGNOSIS — J181 Lobar pneumonia, unspecified organism: Secondary | ICD-10-CM | POA: Diagnosis not present

## 2015-02-12 DIAGNOSIS — Z9981 Dependence on supplemental oxygen: Secondary | ICD-10-CM | POA: Diagnosis not present

## 2015-02-12 DIAGNOSIS — J811 Chronic pulmonary edema: Secondary | ICD-10-CM | POA: Diagnosis not present

## 2015-02-12 DIAGNOSIS — Z4682 Encounter for fitting and adjustment of non-vascular catheter: Secondary | ICD-10-CM | POA: Diagnosis not present

## 2015-02-12 DIAGNOSIS — J90 Pleural effusion, not elsewhere classified: Secondary | ICD-10-CM | POA: Diagnosis not present

## 2015-02-12 DIAGNOSIS — G9341 Metabolic encephalopathy: Secondary | ICD-10-CM | POA: Diagnosis present

## 2015-02-12 DIAGNOSIS — Z452 Encounter for adjustment and management of vascular access device: Secondary | ICD-10-CM | POA: Diagnosis not present

## 2015-02-12 DIAGNOSIS — J690 Pneumonitis due to inhalation of food and vomit: Secondary | ICD-10-CM | POA: Diagnosis present

## 2015-02-12 LAB — BLOOD CULTURE ID PANEL (REFLEXED)
ACINETOBACTER BAUMANNII: NOT DETECTED
CANDIDA KRUSEI: NOT DETECTED
CANDIDA PARAPSILOSIS: NOT DETECTED
CANDIDA TROPICALIS: NOT DETECTED
Candida albicans: NOT DETECTED
Candida glabrata: NOT DETECTED
Carbapenem resistance: NOT DETECTED
ENTEROCOCCUS SPECIES: NOT DETECTED
ESCHERICHIA COLI: NOT DETECTED
Enterobacter cloacae complex: NOT DETECTED
Enterobacteriaceae species: NOT DETECTED
HAEMOPHILUS INFLUENZAE: NOT DETECTED
KLEBSIELLA OXYTOCA: NOT DETECTED
KLEBSIELLA PNEUMONIAE: NOT DETECTED
LISTERIA MONOCYTOGENES: NOT DETECTED
METHICILLIN RESISTANCE: DETECTED — AB
Neisseria meningitidis: NOT DETECTED
PROTEUS SPECIES: NOT DETECTED
Pseudomonas aeruginosa: NOT DETECTED
SERRATIA MARCESCENS: NOT DETECTED
STREPTOCOCCUS PYOGENES: NOT DETECTED
STREPTOCOCCUS SPECIES: NOT DETECTED
Staphylococcus aureus (BCID): NOT DETECTED
Staphylococcus species: DETECTED — AB
Streptococcus agalactiae: NOT DETECTED
Streptococcus pneumoniae: NOT DETECTED
Vancomycin resistance: NOT DETECTED

## 2015-02-12 LAB — BLOOD GAS, ARTERIAL
ACID-BASE DEFICIT: 6.7 mmol/L — AB (ref 0.0–2.0)
BICARBONATE: 17.3 meq/L — AB (ref 21.0–28.0)
FIO2: 1
MECHVT: 450 mL
Mechanical Rate: 28
O2 SAT: 99.3 %
PATIENT TEMPERATURE: 37
PCO2 ART: 30 mmHg — AB (ref 32.0–48.0)
PEEP: 5 cmH2O
PH ART: 7.37 (ref 7.350–7.450)
PO2 ART: 153 mmHg — AB (ref 83.0–108.0)

## 2015-02-12 LAB — GLUCOSE, CAPILLARY
Glucose-Capillary: 182 mg/dL — ABNORMAL HIGH (ref 65–99)
Glucose-Capillary: 158 mg/dL — ABNORMAL HIGH (ref 65–99)
Glucose-Capillary: 128 mg/dL — ABNORMAL HIGH (ref 65–99)
Glucose-Capillary: 165 mg/dL — ABNORMAL HIGH (ref 65–99)

## 2015-02-12 LAB — COMPREHENSIVE METABOLIC PANEL
ALK PHOS: 105 U/L (ref 38–126)
ALT: 34 U/L (ref 17–63)
ANION GAP: 10 (ref 5–15)
AST: 33 U/L (ref 15–41)
Albumin: 3.5 g/dL (ref 3.5–5.0)
BILIRUBIN TOTAL: 1.2 mg/dL (ref 0.3–1.2)
BUN: 30 mg/dL — ABNORMAL HIGH (ref 6–20)
CALCIUM: 8.6 mg/dL — AB (ref 8.9–10.3)
CO2: 19 mmol/L — ABNORMAL LOW (ref 22–32)
Chloride: 105 mmol/L (ref 101–111)
Creatinine, Ser: 1.78 mg/dL — ABNORMAL HIGH (ref 0.61–1.24)
GFR, EST AFRICAN AMERICAN: 48 mL/min — AB (ref >60)
GFR, EST NON AFRICAN AMERICAN: 42 mL/min — AB (ref >60)
GLUCOSE: 254 mg/dL — AB (ref 65–99)
POTASSIUM: 4.2 mmol/L (ref 3.5–5.1)
Sodium: 134 mmol/L — ABNORMAL LOW (ref 135–145)
TOTAL PROTEIN: 7.9 g/dL (ref 6.5–8.1)

## 2015-02-12 LAB — LACTIC ACID, PLASMA
LACTIC ACID, VENOUS: 2.8 mmol/L — AB (ref 0.5–2.0)
Lactic Acid, Venous: 3.8 mmol/L (ref 0.5–2.0)
Lactic Acid, Venous: 3 mmol/L (ref 0.5–2.0)

## 2015-02-12 LAB — TRIGLYCERIDES: Triglycerides: 105 mg/dL (ref <150)

## 2015-02-12 LAB — MRSA PCR SCREENING: MRSA by PCR: NEGATIVE

## 2015-02-12 LAB — TROPONIN I
TROPONIN I: 0.2 ng/mL — AB (ref <0.031)
TROPONIN I: 0.25 ng/mL — AB (ref <0.031)

## 2015-02-12 LAB — URINALYSIS COMPLETE WITH MICROSCOPIC (ARMC ONLY)
Bilirubin Urine: NEGATIVE
Glucose, UA: 50 mg/dL — AB
KETONES UR: NEGATIVE mg/dL
Nitrite: NEGATIVE
PH: 5 (ref 5.0–8.0)
PROTEIN: 100 mg/dL — AB
Specific Gravity, Urine: 1.029 (ref 1.005–1.030)

## 2015-02-12 LAB — APTT: APTT: 39 s — AB (ref 24–36)

## 2015-02-12 LAB — CBC WITH DIFFERENTIAL/PLATELET
Basophils Absolute: 0.3 10*3/uL — ABNORMAL HIGH (ref 0–0.1)
Basophils Relative: 1 %
EOS ABS: 0 10*3/uL (ref 0–0.7)
EOS PCT: 0 %
HEMATOCRIT: 45.3 % (ref 40.0–52.0)
Hemoglobin: 14.2 g/dL (ref 13.0–18.0)
LYMPHS ABS: 2 10*3/uL (ref 1.0–3.6)
Lymphocytes Relative: 7 %
MCH: 25.3 pg — ABNORMAL LOW (ref 26.0–34.0)
MCHC: 31.3 g/dL — AB (ref 32.0–36.0)
MCV: 80.6 fL (ref 80.0–100.0)
MONO ABS: 3.1 10*3/uL — AB (ref 0.2–1.0)
Monocytes Relative: 11 %
Neutro Abs: 23.2 10*3/uL — ABNORMAL HIGH (ref 1.4–6.5)
Neutrophils Relative %: 81 %
PLATELETS: 512 10*3/uL — AB (ref 150–440)
RBC: 5.62 MIL/uL (ref 4.40–5.90)
RDW: 16.4 % — AB (ref 11.5–14.5)
WBC: 28.6 10*3/uL — AB (ref 3.8–10.6)

## 2015-02-12 LAB — PROTIME-INR
INR: 1.3
Prothrombin Time: 16.3 seconds — ABNORMAL HIGH (ref 11.4–15.0)

## 2015-02-12 LAB — LIPASE, BLOOD: LIPASE: 15 U/L (ref 11–51)

## 2015-02-12 LAB — ACETAMINOPHEN LEVEL: Acetaminophen (Tylenol), Serum: <10 ug/mL — ABNORMAL LOW (ref 10–30)

## 2015-02-12 LAB — SALICYLATE LEVEL: Salicylate Lvl: <4 mg/dL (ref 2.8–30.0)

## 2015-02-12 LAB — PROCALCITONIN: PROCALCITONIN: 27.14 ng/mL

## 2015-02-12 MED ORDER — FENTANYL CITRATE (PF) 100 MCG/2ML IJ SOLN: 25.0000 ug | INTRAMUSCULAR | Status: DC | PRN

## 2015-02-12 MED ORDER — VANCOMYCIN HCL 10 G IV SOLR
1250.0000 mg | Freq: Two times a day (BID) | INTRAVENOUS | Status: DC
  Filled 2015-02-12: qty 1250

## 2015-02-12 MED ORDER — PIPERACILLIN-TAZOBACTAM 3.375 G IVPB
INTRAVENOUS | Status: AC
Start: 2015-02-12 — End: 2015-02-12
  Filled 2015-02-12: qty 50

## 2015-02-12 MED ORDER — SODIUM CHLORIDE 0.9 % IV BOLUS (SEPSIS)
1000.0000 mL | Freq: Once | INTRAVENOUS | Status: AC
  Administered 2015-02-12: 1000 mL via INTRAVENOUS

## 2015-02-12 MED ORDER — VITAL HIGH PROTEIN PO LIQD: 1000.0000 mL | ORAL | Status: DC

## 2015-02-12 MED ORDER — VANCOMYCIN HCL IN DEXTROSE 1-5 GM/200ML-% IV SOLN
INTRAVENOUS | Status: AC
  Filled 2015-02-12: qty 200

## 2015-02-12 MED ORDER — INSULIN ASPART 100 UNIT/ML ~~LOC~~ SOLN
2.0000 [IU] | SUBCUTANEOUS | Status: DC
  Administered 2015-02-12 (×2): 4 [IU] via SUBCUTANEOUS
  Administered 2015-02-12 – 2015-02-13 (×2): 2 [IU] via SUBCUTANEOUS
  Administered 2015-02-13: 4 [IU] via SUBCUTANEOUS
  Administered 2015-02-13: 2 [IU] via SUBCUTANEOUS
  Administered 2015-02-13: 4 [IU] via SUBCUTANEOUS
  Administered 2015-02-13 (×3): 2 [IU] via SUBCUTANEOUS
  Administered 2015-02-14: 6 [IU] via SUBCUTANEOUS
  Filled 2015-02-12: qty 6
  Filled 2015-02-12: qty 2
  Filled 2015-02-12: qty 4
  Filled 2015-02-12: qty 2
  Filled 2015-02-12: qty 1
  Filled 2015-02-12: qty 2
  Filled 2015-02-12: qty 1
  Filled 2015-02-12 (×2): qty 4
  Filled 2015-02-12 (×3): qty 2
  Filled 2015-02-12: qty 4

## 2015-02-12 MED ORDER — FREE WATER
100.0000 mL | Freq: Three times a day (TID) | Status: DC
  Administered 2015-02-12 – 2015-02-14 (×6): 100 mL

## 2015-02-12 MED ORDER — PIPERACILLIN-TAZOBACTAM 3.375 G IVPB
3.3750 g | Freq: Three times a day (TID) | INTRAVENOUS | Status: DC
  Administered 2015-02-12 – 2015-02-17 (×16): 3.375 g via INTRAVENOUS
  Filled 2015-02-12 (×18): qty 50

## 2015-02-12 MED ORDER — VANCOMYCIN HCL IN DEXTROSE 1-5 GM/200ML-% IV SOLN
1000.0000 mg | Freq: Once | INTRAVENOUS | Status: AC
  Administered 2015-02-12: 1000 mg via INTRAVENOUS

## 2015-02-12 MED ORDER — CHLORHEXIDINE GLUCONATE 0.12% ORAL RINSE (MEDLINE KIT)
15.0000 mL | Freq: Two times a day (BID) | OROMUCOSAL | Status: DC
  Administered 2015-02-12 – 2015-02-14 (×6): 15 mL via OROMUCOSAL
  Filled 2015-02-12 (×7): qty 15

## 2015-02-12 MED ORDER — PROPOFOL 1000 MG/100ML IV EMUL
5.0000 ug/kg/min | INTRAVENOUS | Status: DC
  Administered 2015-02-12: 5 ug/kg/min via INTRAVENOUS
  Filled 2015-02-12: qty 100

## 2015-02-12 MED ORDER — ENOXAPARIN SODIUM 40 MG/0.4ML ~~LOC~~ SOLN: 40.0000 mg | Freq: Every day | SUBCUTANEOUS | Status: DC

## 2015-02-12 MED ORDER — SODIUM CHLORIDE 0.9 % IV BOLUS (SEPSIS)
1000.0000 mL | INTRAVENOUS | Status: AC
  Administered 2015-02-12 (×3): 1000 mL via INTRAVENOUS

## 2015-02-12 MED ORDER — VITAL 1.5 CAL PO LIQD
1000.0000 mL | ORAL | Status: DC
  Administered 2015-02-12 – 2015-02-13 (×2): 1000 mL

## 2015-02-12 MED ORDER — ALBUTEROL SULFATE (2.5 MG/3ML) 0.083% IN NEBU: 2.5000 mg | INHALATION_SOLUTION | RESPIRATORY_TRACT | Status: DC

## 2015-02-12 MED ORDER — ENOXAPARIN SODIUM 100 MG/ML ~~LOC~~ SOLN
1.0000 mg/kg | Freq: Two times a day (BID) | SUBCUTANEOUS | Status: DC
  Administered 2015-02-12: 90 mg via SUBCUTANEOUS
  Filled 2015-02-12: qty 1

## 2015-02-12 MED ORDER — NOREPINEPHRINE BITARTRATE 1 MG/ML IV SOLN
0.0000 ug/min | Freq: Once | INTRAVENOUS | Status: DC
  Filled 2015-02-12: qty 4

## 2015-02-12 MED ORDER — VANCOMYCIN HCL 10 G IV SOLR
1250.0000 mg | INTRAVENOUS | Status: DC
  Administered 2015-02-13: 1250 mg via INTRAVENOUS
  Filled 2015-02-12 (×2): qty 1250

## 2015-02-12 MED ORDER — ROCURONIUM BROMIDE 50 MG/5ML IV SOLN
100.0000 mg | Freq: Once | INTRAVENOUS | Status: AC
  Administered 2015-02-12: 100 mg via INTRAVENOUS

## 2015-02-12 MED ORDER — PROPOFOL 1000 MG/100ML IV EMUL
0.0000 ug/kg/min | INTRAVENOUS | Status: DC
  Administered 2015-02-12 (×2): 8.5 ug/kg/min via INTRAVENOUS
  Administered 2015-02-12: 20 ug/kg/min via INTRAVENOUS
  Administered 2015-02-13: 15 ug/kg/min via INTRAVENOUS
  Administered 2015-02-13: 20 ug/kg/min via INTRAVENOUS
  Administered 2015-02-13 – 2015-02-14 (×2): 15 ug/kg/min via INTRAVENOUS
  Filled 2015-02-12 (×7): qty 100

## 2015-02-12 MED ORDER — ANTISEPTIC ORAL RINSE SOLUTION (CORINZ)
7.0000 mL | OROMUCOSAL | Status: DC
  Administered 2015-02-12 – 2015-02-14 (×26): 7 mL via OROMUCOSAL
  Filled 2015-02-12 (×32): qty 7

## 2015-02-12 MED ORDER — ACETAMINOPHEN 650 MG RE SUPP
650.0000 mg | Freq: Four times a day (QID) | RECTAL | Status: DC | PRN
  Administered 2015-02-12: 650 mg via RECTAL
  Filled 2015-02-12 (×2): qty 1

## 2015-02-12 MED ORDER — FAMOTIDINE IN NACL 20-0.9 MG/50ML-% IV SOLN
20.0000 mg | Freq: Two times a day (BID) | INTRAVENOUS | Status: DC
Start: 2015-02-12 — End: 2015-02-15
  Administered 2015-02-12 – 2015-02-14 (×7): 20 mg via INTRAVENOUS
  Filled 2015-02-12 (×10): qty 50

## 2015-02-12 MED ORDER — ENOXAPARIN SODIUM 40 MG/0.4ML ~~LOC~~ SOLN
40.0000 mg | SUBCUTANEOUS | Status: DC
  Administered 2015-02-13 – 2015-02-16 (×4): 40 mg via SUBCUTANEOUS
  Filled 2015-02-12 (×4): qty 0.4

## 2015-02-12 MED ORDER — PIPERACILLIN-TAZOBACTAM 3.375 G IVPB 30 MIN
3.3750 g | Freq: Once | INTRAVENOUS | Status: AC
  Administered 2015-02-12: 3.375 g via INTRAVENOUS

## 2015-02-12 MED ORDER — ETOMIDATE 2 MG/ML IV SOLN
20.0000 mg | Freq: Once | INTRAVENOUS | Status: AC
  Administered 2015-02-12: 20 mg via INTRAVENOUS

## 2015-02-12 MED ORDER — VANCOMYCIN HCL 10 G IV SOLR
1250.0000 mg | INTRAVENOUS | Status: DC
  Administered 2015-02-12: 1250 mg via INTRAVENOUS

## 2015-02-12 MED ORDER — ONDANSETRON HCL 4 MG/2ML IJ SOLN: 4.0000 mg | Freq: Four times a day (QID) | INTRAMUSCULAR | Status: DC | PRN

## 2015-02-12 MED ORDER — SODIUM CHLORIDE 0.9 % IV SOLN
INTRAVENOUS | Status: DC
  Administered 2015-02-12 – 2015-02-13 (×4): via INTRAVENOUS

## 2015-02-12 MED ORDER — SODIUM CHLORIDE 0.9 % IV BOLUS (SEPSIS)
2000.0000 mL | INTRAVENOUS | Status: AC
  Administered 2015-02-12: 2000 mL via INTRAVENOUS

## 2015-02-12 MED ORDER — ALBUTEROL SULFATE (2.5 MG/3ML) 0.083% IN NEBU: 2.5000 mg | INHALATION_SOLUTION | RESPIRATORY_TRACT | Status: DC | PRN

## 2015-02-12 MED ORDER — PRO-STAT SUGAR FREE PO LIQD
30.0000 mL | Freq: Four times a day (QID) | ORAL | Status: DC
  Administered 2015-02-12 – 2015-02-14 (×8): 30 mL via ORAL

## 2015-02-12 MED ORDER — METOPROLOL TARTRATE 1 MG/ML IV SOLN
5.0000 mg | Freq: Four times a day (QID) | INTRAVENOUS | Status: DC
  Administered 2015-02-13 – 2015-02-18 (×22): 5 mg via INTRAVENOUS
  Filled 2015-02-12 (×22): qty 5

## 2015-02-12 NOTE — Progress Notes (Signed)
Patient has wife and daughter and grandchild. Patient and wife are estranged but are not divorced, per patient emergency contact: Santo Held, (pt sister)

## 2015-02-12 NOTE — ED Notes (Signed)
Pt BP 85/65. MD York Cerise notified, 3L fluid hung per MD order.

## 2015-02-12 NOTE — Progress Notes (Signed)
Transported patient to icu without incident. Patient required numerous rounds for suctioning et tube for copious amounts for thick tan secretions. hme has been changed a minimal of 3 times due to secretions. Et tube secure. Vent functioning properly

## 2015-02-12 NOTE — ED Notes (Addendum)
Report given to ICU RN. However, patient incontinent of stool and needed suctioning, delaying transport to ICU. Clean gown placed. OG tube inserted without difficulty. Patient resting after Porpofol administration.

## 2015-02-12 NOTE — Progress Notes (Signed)
Pharmacy Antibiotic Follow-up Note  Travis Cantrell is a 55 y.o. year-old male admitted on 02/12/2015.  The patient is currently on day 1 of vancomycin and zosyn for PNA, UTI.  Assessment/Plan: After discussion with Dr. Nicholos Johns, MRSA PCR is negative so will d/c vancomycin.   Temp (24hrs), Avg:102.9 F (39.4 C), Min:99.7 F (37.6 C), Max:104.9 F (40.5 C)   Recent Labs Lab 02/12/15 0104  WBC 28.6*    Recent Labs Lab 02/12/15 0104  CREATININE 1.78*   Estimated Creatinine Clearance: 52.1 mL/min (by C-G formula based on Cr of 1.78).    Allergies  Allergen Reactions  . Baclofen Diarrhea    Antimicrobials this admission: Vancomycin  1/19 >> 1/19 Zosyn 1/19 >>   Levels/dose changes this admission:   Microbiology results: 1/19 BCx: NGTD x 2 1/19 UCx: NGTD  Sputum: pending   MRSA PCR: negative  Thank you for allowing pharmacy to be a part of this patient's care.  Luisa Hart D PharmD 02/12/2015 2:17 PM
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
## 2015-02-12 NOTE — Consult Note (Addendum)
[facility] Consultation     ASSESSMENT/PLAN   55 year old male with multiple sclerosis and chronic indwelling Foley catheter with history of previous UTI, now presents with septic shock, vent dependent respiratory failure.  PULMONARY A: Acute respiratory failure-ventilator dependent. This is likely due to septic shock, possibility pneumonia. P:   -Continue broad-spectrum antibiotics, will check sputum cultures. -Patient does not appear to be candidate for weaning at this time due to continued reduced mental status. We'll continue to monitor.  INFECTIOUS A:  Severe septic shock with leukocytosis, febrile. Suspect UTI as source. -Decubitus ulcer. -Continue fluid resuscitation, continue broad-spectrum antibiotics, monitor CBC and lites periodically. -We'll await culture results and adjust antibiotics accordingly.  MRSA screen negative BCx2 1/19: Pending UC 1/19: Pending Sputum 1/19: Pending  Zosyn 1/18>> Vancomycin 1/18>>  CARDIOVASCULAR A:--  RENAL A: Acute kidney injury, likely due to septic shock. -We'll continue to monitor kidney function. Continue fluid resuscitation.  GASTROINTESTINAL A:  Continue GI prophylaxis with famotidine.  HEMATOLOGIC A:  --    ENDOCRINE A:  Hyperglycemia. -Sliding scale insulin.  NEUROLOGIC A:  Encephalopathy secondary to septic shock.   MAJOR EVENTS/TEST RESULTS: Intubated 1/18.  INDWELLING DEVICES::  Left EJ triple-lumen, 1/19.   ---------------------------------------  ---------------------------------------   Name: Travis Cantrell MRN: [id number] DOB: August 16, 1960    ADMISSION DATE:  02/12/2015 CONSULTATION DATE:  02/12/2015  REFERRING MD :  Dr. Posey Pronto  CHIEF COMPLAINT:  dyspnea   HISTORY OF PRESENT ILLNESS:   The patient is currently intubated on the ventilator. Therefore, all history is obtained from the chart and from staff. The patient is a 55 year old male with a history of multiple  sclerosis. He was recently discharged from the hospital on January 8, at that time it was noted that he had a UTI and acute kidney injury, he also had a 1.5 cm stage III decubitus ulcer. He has an indwelling Foley catheter, he has a history of Proteus growing in the urine.  he is minimally ambulatory. He lives with a friend who he pays apparently to take care of him  Upon presentation to the emergency room it was noted that the patient had acute kidney injury with a creatinine of 1.78, which was increased from 1.12 on the previous admission, he also had leukocytosis with a WBC count of 28.6. Initial ABG showed a pH of 7.37/30/153/17.3. Venous lactate was 3.8 I reviewed the chest x-ray report and images and report 1/19: This appeared to show increased right middle lobe/hilar infiltrate. Per report, the patient had pus appearing urine in the foley as well as pus around the meatus.   PAST MEDICAL HISTORY :  Past Medical History  Diagnosis Date  . Allergy   . GERD (gastroesophageal reflux disease)   . MS (multiple sclerosis) ([place])   . Hypertension   . Decubitus ulcer    Past Surgical History  Procedure Laterality Date  . Vein reconsruction    . Rotator cuff repair     Prior to Admission medications   Medication Sig Start Date End Date Taking? Authorizing Provider  acetaminophen (TYLENOL) 325 MG tablet Take 2 tablets (650 mg total) by mouth every 6 (six) hours as needed for mild pain (or Fever >/= 101). 08/04/14   Nicholes Mango, MD  amLODipine (NORVASC) 5 MG tablet Take 1 tablet (5 mg total) by mouth daily. 06/18/14   Nicholes Mango, MD  atorvastatin (LIPITOR) 40 MG tablet Take 1 tablet (40 mg total) by mouth daily. 07/02/14   Amy E  Diona Browner, MD  cephALEXin (KEFLEX) 500 MG capsule Take 1 capsule (500 mg total) by mouth every 12 (twelve) hours. 02/02/15   Fritzi Mandes, MD  Cholecalciferol (VITAMIN D-3 PO) Take 1 tablet by mouth daily.     Historical Provider, MD  diazepam (VALIUM) 5 MG tablet TAKE ONE TABLET  BY MOUTH EVERY 12 HOURS 01/04/15   Amy Cletis Athens, MD  docusate sodium (COLACE) 100 MG capsule Take 1 capsule (100 mg total) by mouth 2 (two) times daily. 06/18/14   Nicholes Mango, MD  feeding supplement, ENSURE ENLIVE, (ENSURE ENLIVE) LIQD Take 237 mLs by mouth 2 (two) times daily with a meal. 08/04/14   Nicholes Mango, MD  FLUoxetine (PROZAC) 20 MG capsule TAKE TWO CAPSULES BY MOUTH IN THE MORNING AND ONE IN THE EVENING 10/27/14   Amy E Bedsole, MD  gabapentin (NEURONTIN) 300 MG capsule Take 3 capsules (900 mg total) by mouth 3 (three) times daily. 12/16/14   Amy Cletis Athens, MD  ibuprofen (ADVIL,MOTRIN) 800 MG tablet TAKE ONE TABLET BY MOUTH EVERY 8 HOURS AS NEEDED 01/08/15   Amy E Diona Browner, MD  lisinopril (PRINIVIL,ZESTRIL) 10 MG tablet TAKE ONE TABLET BY MOUTH ONCE DAILY 09/03/14   Amy Cletis Athens, MD  metoprolol succinate (TOPROL-XL) 25 MG 24 hr tablet Take 25 mg by mouth daily.     Historical Provider, MD  omeprazole (PRILOSEC) 20 MG capsule TAKE ONE CAPSULE BY MOUTH TWICE DAILY 09/25/14   Amy Cletis Athens, MD  oxybutynin (DITROPAN) 5 MG tablet Take 1 tablet (5 mg total) by mouth every 8 (eight) hours as needed for bladder spasms. 10/17/14   Amy Cletis Athens, MD  saccharomyces boulardii (FLORASTOR) 250 MG capsule Take 1 capsule (250 mg total) by mouth 2 (two) times daily. 06/18/14   Nicholes Mango, MD   Allergies  Allergen Reactions  . Baclofen Diarrhea    FAMILY HISTORY:  Family History  Problem Relation Age of Onset  . Cancer Mother     multiple myeloma  . Diabetes Father   . Heart attack Father    SOCIAL HISTORY:  reports that he has never smoked. He has never used smokeless tobacco. He reports that he does not drink alcohol or use illicit drugs.  REVIEW OF SYSTEMS:   Patient cannot provide a review of systems as he is currently on the ventilator.  VITAL SIGNS: Temp:  [99.7 F (37.6 C)-104.5 F (40.3 C)] 104.5 F (40.3 C) (01/19 0600) Pulse Rate:  [109-144] 126 (01/19 0600) Resp:  [24-31] 28  (01/19 0600) BP: (80-205)/(58-108) 115/76 mmHg (01/19 0600) SpO2:  [93 %-99 %] 99 % (01/19 0600) FiO2 (%):  [70 %-100 %] 70 % (01/19 0553) Weight:  [196 lb 6.9 oz (89.1 kg)] 196 lb 6.9 oz (89.1 kg) (01/19 0600) HEMODYNAMICS:   VENTILATOR SETTINGS: Vent Mode:  [-] PCV FiO2 (%):  [70 %-100 %] 70 % Set Rate:  [20 bmp-28 bmp] 20 bmp Vt Set:  [450 mL] 450 mL PEEP:  [5 cmH20] 5 cmH20 INTAKE / OUTPUT:  Intake/Output Summary (Last 24 hours) at 02/12/15 0831 Last data filed at 02/12/15 5170  Gross per 24 hour  Intake 360.17 ml  Output    450 ml  Net -89.83 ml    Physical Examination:   VS: BP 115/76 mmHg  Pulse 126  Temp(Src) 104.5 F (40.3 C) (Core (Comment))  Resp 28  Ht 6' (1.829 m)  Wt 196 lb 6.9 oz (89.1 kg)  BMI 26.63 kg/m2  SpO2 99%  General Appearance:  No distress  Neuro:without focal findings, mental status, HEENT: PERRLA, EOM intact,  Pulmonary: normal breath sounds., diaphragmatic excursion normal.   CardiovascularNormal S1,S2.  No m/r/g.    Abdomen: Benign, Soft, non-tender, No masses,  Renal:  No costovertebral tenderness  GU:  Not performed at this time. Endoc: No evident thyromegaly, no signs of acromegaly. Skin:   warm, no rashes, no ecchymosis . Skin appears to be unkept, hygiene appears poor, nails are unkept, dirty. Extremities: Significant bilateral lower extremity excoriation. reduced capillary refill.    LABS: Reviewed   LABORATORY PANEL:   CBC  Recent Labs Lab 02/12/15 0104  WBC 28.6*  HGB 14.2  HCT 45.3  PLT 512*    Chemistries   Recent Labs Lab 02/12/15 0104  NA 134*  K 4.2  CL 105  CO2 19*  GLUCOSE 254*  BUN 30*  CREATININE 1.78*  CALCIUM 8.6*  AST 33  ALT 34  ALKPHOS 105  BILITOT 1.2     Recent Labs Lab 02/12/15 0507  GLUCAP 182*    Recent Labs Lab 02/12/15 0123  PHART 7.37  PCO2ART 30*  PO2ART 153*    Recent Labs Lab 02/12/15 0104  AST 33  ALT 34  ALKPHOS 105  BILITOT 1.2  ALBUMIN 3.5     Cardiac Enzymes  Recent Labs Lab 02/12/15 0424  TROPONINI 0.25*    RADIOLOGY:  Dg Abd 1 View  02/12/2015  CLINICAL DATA:  55 year old male with enteric tube placement. Check for tube position. EXAM: ABDOMEN - 1 VIEW COMPARISON:  Abdominal CT dated 03/03/2004 FINDINGS: An enteric tube is partially visualized with tip in the upper abdomen and to the right of the superior endplate of the L1 vertebra. No bowel dilatation identified. A 1.3 cm radiopaque density over the right renal silhouette may represent a kidney stone. There is mild haziness of the left lung base which may represent atelectatic changes. Pneumonia is not excluded. IMPRESSION: Enteric tube the tip in the upper abdomen.  No bowel dilatation. Probable right renal calculus. Electronically Signed   By: Anner Crete M.D.   On: 02/12/2015 06:49   Dg Chest Portable 1 View  02/12/2015  CLINICAL DATA:  Central line placement.  Initial encounter. EXAM: PORTABLE CHEST 1 VIEW COMPARISON:  Chest radiograph performed earlier today at 1:14 a.m. FINDINGS: The patient's endotracheal tube is seen ending 3 cm above the carina. A right IJ line is noted ending about the mid SVC. A small left pleural effusion is noted, apparently new from the prior study. Bilateral central airspace opacities could reflect mild interstitial edema. No pneumothorax is seen. The cardiomediastinal silhouette is borderline normal in size. No acute osseous abnormalities are identified. IMPRESSION: 1. Endotracheal tube seen ending 3 cm above the carina. 2. Right IJ line noted ending about the mid SVC. 3. Small left pleural effusion, apparently new from the prior study. Bilateral central airspace opacification could reflect mild interstitial edema. Electronically Signed   By: Garald Balding M.D.   On: 02/12/2015 02:34   Dg Chest Portable 1 View  02/12/2015  CLINICAL DATA:  55 year old male found unresponsive. Status post intubation. EXAM: PORTABLE CHEST 1 VIEW COMPARISON:   Radiograph dated 11/03/2014 FINDINGS: Endotracheal tube with tip approximately 4.5 cm above the carina. The lungs are clear. No pleural effusion or pneumothorax. The cardiac silhouette is within normal limits. The osseous structures appear unremarkable. IMPRESSION: Endotracheal tube above the carina. Electronically Signed   By: Anner Crete M.D.   On: 02/12/2015 01:41       --  Marda Stalker, MD.  Board Certified in Internal Medicine, Pulmonary Medicine, [place], and Sleep Medicine.  Pager [phone] [facility] [place]  Patricia Pesa, M.D.  Vilinda Boehringer, M.D.  Merton Border, M.D   02/12/2015, 8:31 AM  [place].  I have personally obtained a history, examined the patient, evaluated laboratory and imaging results, formulated the assessment and plan and placed orders. The Patient requires high complexity decision making for assessment and support, frequent evaluation and titration of therapies, application of advanced monitoring technologies and extensive interpretation of multiple databases. The patient has critical illness that could lead imminently to failure of 1 or more organ systems and requires the highest level of physician preparedness to intervene.  Critical Care Time devoted to patient care services described in this note is 35 minutes and is exclusive of time spent in procedures.

## 2015-02-12 NOTE — ED Notes (Signed)
4th L NS hung per MD York Cerise orders.

## 2015-02-12 NOTE — ED Notes (Addendum)
Pt intubated by MD York Cerise, 24 cm at lips. Purulent secretions

## 2015-02-12 NOTE — Progress Notes (Signed)
Patient intubated per Dr York Cerise without difficulty. 8.0 et tube 24 at the lip. Placement confirmed. BBS rhonchi throughout. Able to suction et tube for large amount of yellow secretions without incident. See flowsheet for vent info. HR 136 RR 28 saturation 98. Will continue to monitor

## 2015-02-12 NOTE — Progress Notes (Signed)
[facility] - [place] at [facility]                                                                                                                                                                                            Patient Demographics   Travis Cantrell, is a 55 y.o. male, DOB - [birth year], ZOX:[id number]  Admit date - 02/12/2015   Admitting Physician Ihor Austin, MD  Outpatient Primary MD for the patient is Kerby Nora, MD   LOS - 0  Subjective: Patient admitted with sepsis and acute respiratory failure. Currently on the ventilator. Sedated on [name]     Review of Systems:   CONSTITUTIONAL:  On the ventilator and sedated unable to provide    Vitals:   Filed Vitals:   02/12/15 0800 02/12/15 0900 02/12/15 1000 02/12/15 1100  BP: 115/80 108/76 118/87   Pulse: 123 128 127 135  Temp: 104.7 F (40.4 C) 104.9 F (40.5 C) 104.7 F (40.4 C) 104.4 F (40.2 C)  TempSrc:      Resp: 26 28 27 27   Height:      Weight:      SpO2: 99% 96% 93% 95%    Wt Readings from Last 3 Encounters:  02/12/15 89.1 kg (196 lb 6.9 oz)  01/29/15 93.895 kg (207 lb)  01/03/15 97.523 kg (215 lb)     Intake/Output Summary (Last 24 hours) at 02/12/15 1455 Last data filed at 02/12/15 8119  Gross per 24 hour  Intake 360.17 ml  Output    450 ml  Net -89.83 ml    Physical Exam:   GENERAL: Critically ill-appearing male HEAD, EYES, EARS, NOSE AND THROAT: Atraumatic, normocephalic. . Pupils equal and reactive to light. Sclerae anicteric. No conjunctival injection. No oro-pharyngeal erythema.  NECK: Supple. There is no jugular venous distention. No bruits, no lymphadenopathy, no thyromegaly.  HEART: Regular rate and rhythm,. No murmurs, no rubs, no clicks.  LUNGS: Diminished breath sounds currently on the ventilator ABDOMEN: Soft, flat, nontender, nondistended. Has good bowel sounds. No hepatosplenomegaly appreciated.  EXTREMITIES: No evidence of any cyanosis,  clubbing, or peripheral edema.  +2 pedal and radial pulses bilaterally.  NEUROLOGIC: Sedated  SKIN: Moist and warm with no rashes appreciated. Decubitus ulcer Psych: Sedated LN: No inguinal LN enlargement    Antibiotics   Anti-infectives    Start     Dose/Rate Route Frequency Ordered Stop   02/12/15 1000  piperacillin-tazobactam (ZOSYN) IVPB 3.375 g     3.375 g 12.5 mL/hr over 240 Minutes Intravenous 3 times per day 02/12/15 0500     02/12/15 0900  vancomycin (VANCOCIN)  1,250 mg in sodium chloride 0.9 % 250 mL IVPB  Status:  Discontinued     1,250 mg 166.7 mL/hr over 90 Minutes Intravenous Every 18 hours 02/12/15 0624 02/12/15 1416   02/12/15 0800  vancomycin (VANCOCIN) 1,250 mg in sodium chloride 0.9 % 250 mL IVPB  Status:  Discontinued     1,250 mg 166.7 mL/hr over 90 Minutes Intravenous Every 12 hours 02/12/15 0500 02/12/15 0625   02/12/15 0115  piperacillin-tazobactam (ZOSYN) IVPB 3.375 g     3.375 g 100 mL/hr over 30 Minutes Intravenous  Once 02/12/15 0111 02/12/15 0137   02/12/15 0115  vancomycin (VANCOCIN) IVPB 1000 mg/200 mL premix     1,000 mg 200 mL/hr over 60 Minutes Intravenous  Once 02/12/15 0111 02/12/15 0208      Medications   Scheduled Meds: . antiseptic oral rinse  7 mL Mouth Rinse 10 times per day  . chlorhexidine gluconate  15 mL Mouth Rinse BID  . [START ON 02/13/2015] enoxaparin (LOVENOX) injection  40 mg Subcutaneous Q24H  . famotidine (PEPCID) IV  20 mg Intravenous Q12H  . feeding supplement (PRO-STAT SUGAR FREE 64)  30 mL Oral QID  . free water  100 mL Per Tube 3 times per day  . insulin aspart  2-6 Units Subcutaneous 6 times per day  . metoprolol  5 mg Intravenous 4 times per day  . norepinephrine (LEVOPHED) Adult infusion  0-40 mcg/min Intravenous Once  . piperacillin-tazobactam (ZOSYN)  IV  3.375 g Intravenous 3 times per day   Continuous Infusions: . sodium chloride 150 mL/hr at 02/12/15 1100  . feeding supplement (VITAL 1.5 CAL)    . propofol  (DIPRIVAN) infusion 15.087 mcg/kg/min (02/12/15 1100)   PRN Meds:.acetaminophen, albuterol, fentaNYL (SUBLIMAZE) injection, ondansetron (ZOFRAN) IV   Data Review:   Micro Results Recent Results (from the past 240 hour(s))  Blood Culture (routine x 2)     Status: None (Preliminary result)   Collection Time: 02/12/15  1:04 AM  Result Value Ref Range Status   Specimen Description BLOOD RIGHT HAND  Final   Special Requests   Final    BOTTLES DRAWN AEROBIC AND ANAEROBIC 15MLANEROBIC,10MLAEROBIC   Culture NO GROWTH < 12 HOURS  Final   Report Status PENDING  Incomplete  Blood Culture (routine x 2)     Status: None (Preliminary result)   Collection Time: 02/12/15  1:04 AM  Result Value Ref Range Status   Specimen Description BLOOD LEFT HAND  Final   Special Requests   Final    BOTTLES DRAWN AEROBIC AND ANAEROBIC 10AEROBIC,16MLANAEROBIC   Culture NO GROWTH < 12 HOURS  Final   Report Status PENDING  Incomplete  Urine culture     Status: None (Preliminary result)   Collection Time: 02/12/15  2:18 AM  Result Value Ref Range Status   Specimen Description URINE, RANDOM  Final   Special Requests NONE  Final   Culture NO GROWTH < 12 HOURS  Final   Report Status PENDING  Incomplete  MRSA PCR Screening     Status: None   Collection Time: 02/12/15  5:18 AM  Result Value Ref Range Status   MRSA by PCR NEGATIVE NEGATIVE Final    Comment:        The GeneXpert MRSA Assay (FDA approved for NASAL specimens only), is one component of a comprehensive MRSA colonization surveillance program. It is not intended to diagnose MRSA infection nor to guide or monitor treatment for MRSA infections.   Culture, expectorated sputum-assessment  Status: None (Preliminary result)   Collection Time: 02/12/15  9:24 AM  Result Value Ref Range Status   Specimen Description PENDING  Incomplete   Special Requests PENDING  Incomplete   Report Status PENDING  Incomplete    Radiology Reports Dg Abd 1  View  02/12/2015  CLINICAL DATA:  55 year old male with enteric tube placement. Check for tube position. EXAM: ABDOMEN - 1 VIEW COMPARISON:  Abdominal CT dated 03/03/2004 FINDINGS: An enteric tube is partially visualized with tip in the upper abdomen and to the right of the superior endplate of the L1 vertebra. No bowel dilatation identified. A 1.3 cm radiopaque density over the right renal silhouette may represent a kidney stone. There is mild haziness of the left lung base which may represent atelectatic changes. Pneumonia is not excluded. IMPRESSION: Enteric tube the tip in the upper abdomen.  No bowel dilatation. Probable right renal calculus. Electronically Signed   By: Elgie Collard M.D.   On: 02/12/2015 06:49   Dg Chest Portable 1 View  02/12/2015  CLINICAL DATA:  Central line placement.  Initial encounter. EXAM: PORTABLE CHEST 1 VIEW COMPARISON:  Chest radiograph performed earlier today at 1:14 a.m. FINDINGS: The patient's endotracheal tube is seen ending 3 cm above the carina. A right IJ line is noted ending about the mid SVC. A small left pleural effusion is noted, apparently new from the prior study. Bilateral central airspace opacities could reflect mild interstitial edema. No pneumothorax is seen. The cardiomediastinal silhouette is borderline normal in size. No acute osseous abnormalities are identified. IMPRESSION: 1. Endotracheal tube seen ending 3 cm above the carina. 2. Right IJ line noted ending about the mid SVC. 3. Small left pleural effusion, apparently new from the prior study. Bilateral central airspace opacification could reflect mild interstitial edema. Electronically Signed   By: Roanna Raider M.D.   On: 02/12/2015 02:34   Dg Chest Portable 1 View  02/12/2015  CLINICAL DATA:  55 year old male found unresponsive. Status post intubation. EXAM: PORTABLE CHEST 1 VIEW COMPARISON:  Radiograph dated 11/03/2014 FINDINGS: Endotracheal tube with tip approximately 4.5 cm above the carina.  The lungs are clear. No pleural effusion or pneumothorax. The cardiac silhouette is within normal limits. The osseous structures appear unremarkable. IMPRESSION: Endotracheal tube above the carina. Electronically Signed   By: Elgie Collard M.D.   On: 02/12/2015 01:41     CBC  Recent Labs Lab 02/12/15 0104  WBC 28.6*  HGB 14.2  HCT 45.3  PLT 512*  MCV 80.6  MCH 25.3*  MCHC 31.3*  RDW 16.4*  LYMPHSABS 2.0  MONOABS 3.1*  EOSABS 0.0  BASOSABS 0.3*    Chemistries   Recent Labs Lab 02/12/15 0104  NA 134*  K 4.2  CL 105  CO2 19*  GLUCOSE 254*  BUN 30*  CREATININE 1.78*  CALCIUM 8.6*  AST 33  ALT 34  ALKPHOS 105  BILITOT 1.2   ------------------------------------------------------------------------------------------------------------------ estimated creatinine clearance is 52.1 mL/min (by C-G formula based on Cr of 1.78). ------------------------------------------------------------------------------------------------------------------ No results for input(s): HGBA1C in the last 72 hours. ------------------------------------------------------------------------------------------------------------------  Recent Labs  02/12/15 0424 02/12/15 0859  TRIG 105 NOTIFIED PAM CRAWFORD AT 1130 ON 02/11/15 BY VAB   ------------------------------------------------------------------------------------------------------------------ No results for input(s): TSH, T4TOTAL, T3FREE, THYROIDAB in the last 72 hours.  Invalid input(s): FREET3 ------------------------------------------------------------------------------------------------------------------ No results for input(s): VITAMINB12, FOLATE, FERRITIN, TIBC, IRON, RETICCTPCT in the last 72 hours.  Coagulation profile  Recent Labs Lab 02/12/15 0104  INR 1.30    No  results for input(s): DDIMER in the last 72 hours.  Cardiac Enzymes  Recent Labs Lab 02/12/15 0104 02/12/15 0424  TROPONINI 0.20* 0.25*    ------------------------------------------------------------------------------------------------------------------ Invalid input(s): POCBNP    Assessment & Plan   Admitting diagnosis 1. Acute respiratory failure: Likely related to aspiration pneumonia and septic shock, continue Ventilator support if patient's respiratory status doesn't improve we'll consider CT of the chest to rule out pulmonary embolism. 2. Severe sepsis due to urinary tract infection and possible decubitus ulcer continue broad-spectrum antibiotics with vancomycin and Zosyn, recent admission with Proteus in the urine. 3. Hypotension continue IV fluids and Levophed 4. Aspiration pneumonia swallow eval once awake 5. Acute renal failure likely related to ATN and sepsis IV fluids monitor renal function 6. Abnormal troponin secondary to severe sepsis and renal failure appreciate cardiology input 7. Decubitus ulcer continue current supportive care wound care consult 8. Urinary tract infection and sepsis 9. Multiple sclerosis 10. Sinus tachycardia started on Lopressor IV  Code Status History    Date Active Date Inactive Code Status Order ID Comments User Context   01/29/2015  9:46 AM 02/02/2015  6:20 PM Full Code [id number]  Ihor Austin, MD Inpatient   11/03/2014  8:04 AM 11/05/2014  2:36 PM Full Code [id number]  Arnaldo Natal, MD Inpatient   08/01/2014  3:18 PM 08/04/2014  6:26 PM Full Code [id number]  Katha Hamming, MD ED   06/15/2014  1:36 AM 06/18/2014  8:33 PM Full Code [id number]  Arnaldo Natal, MD ED           Consults  critical care, cardiology DVT Prophylaxis  Lovenox   Lab Results  Component Value Date   PLT 512* 02/12/2015     Time Spent in minutes   45 minutes of critical care time Auburn Bilberry M.D on 02/12/2015 at 2:55 PM  Between 7am to 6pm - Pager - [phone]  After 6pm go to [email or web address] - password [email or web address] [facility]  [facility] [place] [facility]   Office  [phone]

## 2015-02-12 NOTE — Progress Notes (Signed)
eLink Physician-Brief Progress Note Patient Name: OSBOURNE HOLTZCLAW DOB: [birth year] MRN: [id number]   Date of Service  02/12/2015  HPI/Events of Note  Admission note, data reviewed. Camera check performed. Admission orders reviewed and clarified - in particular, vent was changed to PCV mode to facilitate comfort and synchrony  eICU Interventions  PCCM will see formally in consultation later in morning     Intervention Category Evaluation Type: New Patient Evaluation  Billy Fischer 02/12/2015, 5:45 AM

## 2015-02-12 NOTE — Progress Notes (Signed)
Pharmacy Antibiotic Follow-up Note  Travis Cantrell is a 55 y.o. year-old male admitted on 02/12/2015.  The patient is currently on day 1 of vancomycin and zosyn for PNA, UTI.  Assessment/Plan: Lab called to inform me they detected staphlococcal sp. mecA positive in 1 set of bottles (aerobic and anaerobic). Spoke with Dr. Anne Hahn, okay to restart vancomycin.   Temp (24hrs), Avg:102.6 F (39.2 C), Min:98.8 F (37.1 C), Max:104.9 F (40.5 C)   Recent Labs Lab 02/12/15 0104  WBC 28.6*     Recent Labs Lab 02/12/15 0104  CREATININE 1.78*   Estimated Creatinine Clearance: 52.1 mL/min (by C-G formula based on Cr of 1.78).    Allergies  Allergen Reactions  . Baclofen Diarrhea    Antimicrobials this admission: Vancomycin  1/19 >> 1/19 Zosyn 1/19 >>   Levels/dose changes this admission:   Microbiology results: 1/19 BCx: NGTD x 2 1/19 UCx: NGTD  Sputum: pending   MRSA PCR: negative  Thank you for allowing pharmacy to be a part of this patient's care.  Carola Frost , Pharm.D., BCPS Clinical Pharmacist  02/12/2015 10:07 PM

## 2015-02-12 NOTE — Progress Notes (Signed)
Initial Nutrition Assessment    INTERVENTION:   EN: received verbal order from MD Ramachandran to start TF; recommend starting Vital 1.5 at rate of 60 ml/hr providing 2160 kcals, 98 g of protein and 1094 mL of free water. Addition of Prostat QID for additional 400 kcals, 60 g of protein plus additional kcals from diprivan. Continue to assess   NUTRITION DIAGNOSIS:   Inadequate oral intake related to acute illness as evidenced by NPO status.  GOAL:   Provide needs based on ASPEN/SCCM guidelines  MONITOR:    (Energy Intake, Anthropometrics, Digestive System, Electrolyte/Renal Profile, Glucose Profile)  REASON FOR ASSESSMENT:   Ventilator    ASSESSMENT:    Pt admitted with acute respiratory failure requiring intubation, septic shock, UTI, AKI  Past Medical History  Diagnosis Date  . Allergy   . GERD (gastroesophageal reflux disease)   . MS (multiple sclerosis) (HCC)   . Hypertension   . Decubitus ulcer      Diet Order:  Diet NPO time specified   Digestive System: OG in place  Electrolyte and Renal Profile:  Recent Labs Lab 02/12/15 0104  BUN 30*  CREATININE 1.78*  NA 134*  K 4.2   Glucose Profile:   Recent Labs  02/12/15 0507 02/12/15 1213  GLUCAP 182* 158*   Meds: NS at 150 ml/hr, ss novolog, levophed, diprivan (224 kcals in 24 hours at current rate)  Skin: stage II/III pressure ulcer  Height:   Ht Readings from Last 1 Encounters:  02/12/15 6' (1.829 m)    Weight:   Wt Readings from Last 1 Encounters:  02/12/15 196 lb 6.9 oz (89.1 kg)    BMI:  Body mass index is 26.63 kg/(m^2).  Estimated Nutritional Needs:   Kcal:  2763 kcals (Ve: 16.7, Tmax: 40.5) using current wt of 89.1 kg  Protein:  134-178 g(1.5-2.0 g/kg)   Fluid:  2225-2670 mL (25-30 ml/kg)   HIGH Care Level  Romelle Starcher MS, RD, LDN [phone] Pager  [phone] Weekend/On-Call Pager

## 2015-02-12 NOTE — Progress Notes (Addendum)
ANTIBIOTIC CONSULT NOTE - INITIAL  Pharmacy Consult for Zosyn/vancomycin Indication: rule out sepsis  Allergies  Allergen Reactions  . Baclofen Diarrhea    Patient Measurements:   Adjusted Body Weight:   Vital Signs: BP: 142/103 mmHg (01/19 0103) Pulse Rate: 144 (01/19 0103) Intake/Output from previous day:   Intake/Output from this shift:    Labs: No results for input(s): WBC, HGB, PLT, LABCREA, CREATININE in the last 72 hours. Estimated Creatinine Clearance: 86.8 mL/min (by C-G formula based on Cr of 1.12). No results for input(s): VANCOTROUGH, VANCOPEAK, VANCORANDOM, GENTTROUGH, GENTPEAK, GENTRANDOM, TOBRATROUGH, TOBRAPEAK, TOBRARND, AMIKACINPEAK, AMIKACINTROU, AMIKACIN in the last 72 hours.   Microbiology: Recent Results (from the past 720 hour(s))  Blood culture (routine x 2)     Status: None   Collection Time: 01/29/15  5:30 AM  Result Value Ref Range Status   Specimen Description BLOOD LEFT AC  Final   Special Requests   Final    BOTTLES DRAWN AEROBIC AND ANAEROBIC ANAEROBIC AEROBIC   Culture NO GROWTH 5 DAYS  Final   Report Status 02/03/2015 FINAL  Final  Blood culture (routine x 2)     Status: None   Collection Time: 01/29/15  5:40 AM  Result Value Ref Range Status   Specimen Description BLOOD RIGHT AC  Final   Special Requests   Final    BOTTLES DRAWN AEROBIC AND ANAEROBIC ANAEROBIC AEROBIC   Culture NO GROWTH 5 DAYS  Final   Report Status 02/03/2015 FINAL  Final  Urine culture     Status: None   Collection Time: 01/29/15  5:43 AM  Result Value Ref Range Status   Specimen Description URINE, CATHETERIZED  Final   Special Requests NONE  Final   Culture >=100,000 COLONIES/mL PROTEUS MIRABILIS  Final   Report Status 01/31/2015 FINAL  Final   Organism ID, Bacteria PROTEUS MIRABILIS  Final      Susceptibility   Proteus mirabilis - MIC*    AMPICILLIN 16 INTERMEDIATE Intermediate     CEFTAZIDIME <=1 SENSITIVE Sensitive     CEFAZOLIN 8  SENSITIVE Sensitive     CEFTRIAXONE <=1 SENSITIVE Sensitive     CIPROFLOXACIN >=4 RESISTANT Resistant     GENTAMICIN <=1 SENSITIVE Sensitive     IMIPENEM >=16 RESISTANT Resistant     TRIMETH/SULFA 160 RESISTANT Resistant     PIP/TAZO Value in next row Sensitive      SENSITIVE<=4    * >=100,000 COLONIES/mL PROTEUS MIRABILIS  MRSA PCR Screening     Status: None   Collection Time: 01/29/15 12:50 PM  Result Value Ref Range Status   MRSA by PCR NEGATIVE NEGATIVE Final    Comment:        The GeneXpert MRSA Assay (FDA approved for NASAL specimens only), is one component of a comprehensive MRSA colonization surveillance program. It is not intended to diagnose MRSA infection nor to guide or monitor treatment for MRSA infections.     Medical History: Past Medical History  Diagnosis Date  . Allergy   . GERD (gastroesophageal reflux disease)   . MS (multiple sclerosis) (HCC)   . Hypertension   . Decubitus ulcer     Medications:  Infusions:  . piperacillin-tazobactam    . piperacillin-tazobactam    . sodium chloride    . vancomycin    . vancomycin     Assessment: 54 yom found unresponsive, recently released (Tuesday) for UTI. Pharmacy consulted to dose vancomycin/Zosyn for sepsis.   Vd 57 L, Ke  0.076 hr-1, T1/2 9 hr.  Goal of Therapy:  Vancomycin trough level 15-20 mcg/ml  Plan:  Expected duration 7 days with resolution of temperature and/or normalization of WBC. Zosyn 3.375 gm IV Q8H EI and vancomycin 1 gm IV x 1 in ED followed by 1.25 gm IV Q12H, predicted trough 15 mcg/mL. Pharmacy will continue to follow and adjust as needed to maintain trough 15 to 20 mcg/mL.  Carola Frost, Pharm.D., BCPS Clinical Pharmacist 02/12/2015,1:14 AM  Update 1610 02/12/2015    Patient info updated, dosing for vancomycin changed empirically to 1.25 gm IV Q18H. Pharmacy will continue to follow.
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
## 2015-02-12 NOTE — H&P (Signed)
[facility] [place] NAME: Travis Cantrell    MR#:  [id number]  DATE OF BIRTH:  [birth year]  DATE OF ADMISSION:  02/12/2015  PRIMARY CARE PHYSICIAN: Eliezer Lofts, MD   REQUESTING/REFERRING PHYSICIAN:   CHIEF COMPLAINT:   Chief Complaint  Patient presents with  . Code Sepsis  . Altered Mental Status    HISTORY OF PRESENT ILLNESS: Travis Cantrell  is a 55 y.o. male with a known history of multiple sclerosis, hypertension, decubitus ulcer, Proteus mirabilis urinary tract infection presented to the emergency room with lethargy, confusion and difficulty breathing. Patient lives with roommates at home. They found him with decreased responsiveness and EMS was called. When EMS arrived at patient's house he was having labored breathing with decreased responsiveness. Patient was bagged by the EMS and brought to the emergency emergency room. In the emergency room patient was electively intubated and put on ventilator for respiratory failure and for hypoxia and and decreased O2 saturation. Also had a fever of 10 72F. Not much history could be (the patient. Agent was given etomidate and rocuronium prior to intubation. She was resuscitated with IV fluids, 4 L of fluids were given for resuscitation. Initially hypotensive but later on after fluid resuscitation was done blood pressure responded and improved to 110/85 mmHg. His fever came down to 10 89F. Patient was given IV vancomycin and IV Zosyn antibiotics in the emergency room. Patient has a chronic indwelling Foley catheter and cloudy urine was observed and was poorly maintained. Patient was recently discharged from our hospital on 02/01/2014 after being managed for urinary tract infection and abdominal pain. Much of the history and information was obtained from the ER attending and emergency room chart.  PAST MEDICAL HISTORY:   Past Medical History  Diagnosis Date  . Allergy   . GERD (gastroesophageal reflux  disease)   . MS (multiple sclerosis) ([place])   . Hypertension   . Decubitus ulcer     PAST SURGICAL HISTORY: Past Surgical History  Procedure Laterality Date  . Vein reconsruction    . Rotator cuff repair      SOCIAL HISTORY:  Social History  Substance Use Topics  . Smoking status: Never Smoker   . Smokeless tobacco: Never Used  . Alcohol Use: No    FAMILY HISTORY:  Family History  Problem Relation Age of Onset  . Cancer Mother     multiple myeloma  . Diabetes Father   . Heart attack Father     DRUG ALLERGIES:  Allergies  Allergen Reactions  . Baclofen Diarrhea    REVIEW OF SYSTEMS:  Could not be obtained as patient is on the ventilator MEDICATIONS AT HOME:  Prior to Admission medications   Medication Sig Start Date End Date Taking? Authorizing Provider  acetaminophen (TYLENOL) 325 MG tablet Take 2 tablets (650 mg total) by mouth every 6 (six) hours as needed for mild pain (or Fever >/= 101). 08/04/14   Nicholes Mango, MD  amLODipine (NORVASC) 5 MG tablet Take 1 tablet (5 mg total) by mouth daily. 06/18/14   Nicholes Mango, MD  atorvastatin (LIPITOR) 40 MG tablet Take 1 tablet (40 mg total) by mouth daily. 07/02/14   Amy Cletis Athens, MD  cephALEXin (KEFLEX) 500 MG capsule Take 1 capsule (500 mg total) by mouth every 12 (twelve) hours. 02/02/15   Fritzi Mandes, MD  Cholecalciferol (VITAMIN D-3 PO) Take 1 tablet by mouth daily.     Historical Provider, MD  diazepam (VALIUM)  5 MG tablet TAKE ONE TABLET BY MOUTH EVERY 12 HOURS 01/04/15   Amy Cletis Athens, MD  docusate sodium (COLACE) 100 MG capsule Take 1 capsule (100 mg total) by mouth 2 (two) times daily. 06/18/14   Nicholes Mango, MD  feeding supplement, ENSURE ENLIVE, (ENSURE ENLIVE) LIQD Take 237 mLs by mouth 2 (two) times daily with a meal. 08/04/14   Nicholes Mango, MD  FLUoxetine (PROZAC) 20 MG capsule TAKE TWO CAPSULES BY MOUTH IN THE MORNING AND ONE IN THE EVENING 10/27/14   Amy E Bedsole, MD  gabapentin (NEURONTIN) 300 MG capsule Take 3  capsules (900 mg total) by mouth 3 (three) times daily. 12/16/14   Amy Cletis Athens, MD  ibuprofen (ADVIL,MOTRIN) 800 MG tablet TAKE ONE TABLET BY MOUTH EVERY 8 HOURS AS NEEDED 01/08/15   Amy E Diona Browner, MD  lisinopril (PRINIVIL,ZESTRIL) 10 MG tablet TAKE ONE TABLET BY MOUTH ONCE DAILY 09/03/14   Amy Cletis Athens, MD  metoprolol succinate (TOPROL-XL) 25 MG 24 hr tablet Take 25 mg by mouth daily.     Historical Provider, MD  omeprazole (PRILOSEC) 20 MG capsule TAKE ONE CAPSULE BY MOUTH TWICE DAILY 09/25/14   Amy Cletis Athens, MD  oxybutynin (DITROPAN) 5 MG tablet Take 1 tablet (5 mg total) by mouth every 8 (eight) hours as needed for bladder spasms. 10/17/14   Amy Cletis Athens, MD  saccharomyces boulardii (FLORASTOR) 250 MG capsule Take 1 capsule (250 mg total) by mouth 2 (two) times daily. 06/18/14   Nicholes Mango, MD      PHYSICAL EXAMINATION:   VITAL SIGNS: Blood pressure 87/67, pulse 109, temperature 101.7 F (38.7 C), resp. rate 26, SpO2 99 %.  On ventilator with FIO2 70% GENERAL:  55 y.o.-year-old patient lying in the bed in moderate distress  EYES: Pupils equal, round,sluggishly reactive to light and accommodation. No scleral icterus. Extraocular muscles intact.  HEENT: Head atraumatic, normocephalic. ET tube noted NECK:  Supple, no jugular venous distention. No thyroid enlargement, no tenderness.  LUNGS: Decreased sounds bilaterally, rales heard bilaterally in both lungs. On ventilator CARDIOVASCULAR: S1, S2 tachycardia. No murmurs, rubs, or gallops.  ABDOMEN: Soft, nontender, nondistended. Bowel sounds present. No organomegaly or mass.  EXTREMITIES: No pedal edema noted NEUROLOGIC: Not oriented to time,place and person PSYCHIATRIC:could not be assessed SKIN: Has sacral decubitus ulcer.  LABORATORY PANEL:   CBC  Recent Labs Lab 02/12/15 0104  WBC 28.6*  HGB 14.2  HCT 45.3  PLT 512*  MCV 80.6  MCH 25.3*  MCHC 31.3*  RDW 16.4*  LYMPHSABS 2.0  MONOABS 3.1*  EOSABS 0.0  BASOSABS 0.3*    ------------------------------------------------------------------------------------------------------------------  Chemistries   Recent Labs Lab 02/12/15 0104  NA 134*  K 4.2  CL 105  CO2 19*  GLUCOSE 254*  BUN 30*  CREATININE 1.78*  CALCIUM 8.6*  AST 33  ALT 34  ALKPHOS 105  BILITOT 1.2   ------------------------------------------------------------------------------------------------------------------ estimated creatinine clearance is 54.6 mL/min (by C-G formula based on Cr of 1.78). ------------------------------------------------------------------------------------------------------------------ No results for input(s): TSH, T4TOTAL, T3FREE, THYROIDAB in the last 72 hours.  Invalid input(s): FREET3   Coagulation profile  Recent Labs Lab 02/12/15 0104  INR 1.30   ------------------------------------------------------------------------------------------------------------------- No results for input(s): DDIMER in the last 72 hours. -------------------------------------------------------------------------------------------------------------------  Cardiac Enzymes  Recent Labs Lab 02/12/15 0104  TROPONINI 0.20*   ------------------------------------------------------------------------------------------------------------------ Invalid input(s): POCBNP  ---------------------------------------------------------------------------------------------------------------  Urinalysis    Component Value Date/Time   COLORURINE AMBER* 02/12/2015 0218   COLORURINE Amber 10/29/2013 1415   APPEARANCEUR  CLOUDY* 02/12/2015 0218   APPEARANCEUR Turbid 10/29/2013 1415   LABSPEC 1.029 02/12/2015 0218   LABSPEC 1.016 10/29/2013 1415   PHURINE 5.0 02/12/2015 0218   PHURINE 8.0 10/29/2013 1415   GLUCOSEU 50* 02/12/2015 0218   GLUCOSEU Negative 10/29/2013 1415   HGBUR 3+* 02/12/2015 0218   HGBUR Negative 10/29/2013 1415   BILIRUBINUR NEGATIVE 02/12/2015 0218   BILIRUBINUR  Negative 10/29/2013 1415   BILIRUBINUR neg 02/18/2011 1229   KETONESUR NEGATIVE 02/12/2015 0218   KETONESUR Negative 10/29/2013 1415   PROTEINUR 100* 02/12/2015 0218   PROTEINUR 100 mg/dL 10/29/2013 1415   PROTEINUR 100 02/18/2011 1229   UROBILINOGEN 4.0* 05/28/2011 1859   UROBILINOGEN negative 02/18/2011 1229   NITRITE NEGATIVE 02/12/2015 0218   NITRITE Negative 10/29/2013 1415   NITRITE POSITIVE 02/18/2011 1229   LEUKOCYTESUR 2+* 02/12/2015 0218   LEUKOCYTESUR 3+ 10/29/2013 1415     RADIOLOGY: Dg Chest Portable 1 View  02/12/2015  CLINICAL DATA:  Central line placement.  Initial encounter. EXAM: PORTABLE CHEST 1 VIEW COMPARISON:  Chest radiograph performed earlier today at 1:14 a.m. FINDINGS: The patient's endotracheal tube is seen ending 3 cm above the carina. A right IJ line is noted ending about the mid SVC. A small left pleural effusion is noted, apparently new from the prior study. Bilateral central airspace opacities could reflect mild interstitial edema. No pneumothorax is seen. The cardiomediastinal silhouette is borderline normal in size. No acute osseous abnormalities are identified. IMPRESSION: 1. Endotracheal tube seen ending 3 cm above the carina. 2. Right IJ line noted ending about the mid SVC. 3. Small left pleural effusion, apparently new from the prior study. Bilateral central airspace opacification could reflect mild interstitial edema. Electronically Signed   By: Garald Balding M.D.   On: 02/12/2015 02:34   Dg Chest Portable 1 View  02/12/2015  CLINICAL DATA:  55 year old male found unresponsive. Status post intubation. EXAM: PORTABLE CHEST 1 VIEW COMPARISON:  Radiograph dated 11/03/2014 FINDINGS: Endotracheal tube with tip approximately 4.5 cm above the carina. The lungs are clear. No pleural effusion or pneumothorax. The cardiac silhouette is within normal limits. The osseous structures appear unremarkable. IMPRESSION: Endotracheal tube above the carina. Electronically  Signed   By: Anner Crete M.D.   On: 02/12/2015 01:41    EKG: Orders placed or performed during the hospital encounter of 02/12/15  . EKG 12-Lead  . EKG 12-Lead  . EKG 12-Lead  . EKG 12-Lead    IMPRESSION AND PLAN: 55 year old male patient with history of multiple sclerosis, hypertension, decubitus ulcer presented to the emergency room with confusion and lethargy,Fever, hypotension and difficulty breathing. Evaluation in the emergency room revealed patient to be in sepsis. Patient was electively intubated for respiratory failure.  Admitting diagnosis 1. Acute respiratory failure 2. Severe sepsis 3. Hypotension 4. Aspiration pneumonia 5. Hypoxia 6. Acute renal failure 7. Abnormal troponin secondary to severe sepsis and renal failure 8. Decubitus ulcer 9. Urinary tract infection and sepsis 10. Multiple sclerosis  Treatment plan 1. IV fluid resuscitation 2. Continue mechanical ventilator 3. Serial troponin to assess for ischemia 4. Start patient on IV vancomycin and IV Zosyn antibiotics 5. IV pressor medication if patient does not respond to IV fluids for blood pressure 6. Intensivist consultation for respiratory failure 7. DVT prophylaxis with subcutaneous Lovenox 8. Wound care consultation 9.  Hrisk of morbidity and mortality  All the records are reviewed and case discussed with ED provider. Management plans discussed with the patient, family and they are in agreement.  CODE STATUS:FULL Code Status History    Date Active Date Inactive Code Status Order ID Comments User Context   01/29/2015  9:46 AM 02/02/2015  6:20 PM Full Code [id number]  Saundra Shelling, MD Inpatient   11/03/2014  8:04 AM 11/05/2014  2:36 PM Full Code [id number]  Harrie Foreman, MD Inpatient   08/01/2014  3:18 PM 08/04/2014  6:26 PM Full Code [id number]  Epifanio Lesches, MD ED   06/15/2014  1:36 AM 06/18/2014  8:33 PM Full Code [id number]  Harrie Foreman, MD ED       TOTAL CRITICAL CARETIME TAKING  CARE OF THIS PATIENT: 59 minutes.    Saundra Shelling M.D on 02/12/2015 at 3:09 AM  Between 7am to 6pm - Pager - [phone]  After 6pm go to [email or web address] - password [email or web address]  [facility]  Office  [phone]  CC: Primary care physician; Eliezer Lofts, MD

## 2015-02-12 NOTE — Progress Notes (Signed)
Travis Cantrell is a 55 y.o. male  [id number]  Primary Cardiologist: Dr. Humphrey Rolls Reason for Consultation: Elevated troponin  HPI: Safe 55 year old white male with a past medical history of multiple UTI presented to the hospital with respiratory failure and septic shock I was asked to evaluate the patient because of mildly elevated troponin. Patient is currently intubated and sedated and unable to get any history.   Review of Systems: No history obtainable   Past Medical History  Diagnosis Date  . Allergy   . GERD (gastroesophageal reflux disease)   . MS (multiple sclerosis) ([place])   . Hypertension   . Decubitus ulcer     Medications Prior to Admission  Medication Sig Dispense Refill  . acetaminophen (TYLENOL) 325 MG tablet Take 2 tablets (650 mg total) by mouth every 6 (six) hours as needed for mild pain (or Fever >/= 101).    Marland Kitchen amLODipine (NORVASC) 5 MG tablet Take 1 tablet (5 mg total) by mouth daily. 30 tablet 0  . atorvastatin (LIPITOR) 40 MG tablet Take 1 tablet (40 mg total) by mouth daily. 90 tablet 1  . cephALEXin (KEFLEX) 500 MG capsule Take 1 capsule (500 mg total) by mouth every 12 (twelve) hours. 10 capsule 0  . Cholecalciferol (VITAMIN D-3 PO) Take 1 tablet by mouth daily.     . diazepam (VALIUM) 5 MG tablet TAKE ONE TABLET BY MOUTH EVERY 12 HOURS 30 tablet 0  . docusate sodium (COLACE) 100 MG capsule Take 1 capsule (100 mg total) by mouth 2 (two) times daily. 10 capsule 0  . feeding supplement, ENSURE ENLIVE, (ENSURE ENLIVE) LIQD Take 237 mLs by mouth 2 (two) times daily with a meal. 237 mL 12  . FLUoxetine (PROZAC) 20 MG capsule TAKE TWO CAPSULES BY MOUTH IN THE MORNING AND ONE IN THE EVENING 90 capsule 5  . gabapentin (NEURONTIN) 300 MG capsule Take 3 capsules (900 mg total) by mouth 3 (three) times daily. 360 capsule 0  . ibuprofen (ADVIL,MOTRIN) 800 MG tablet TAKE ONE TABLET BY MOUTH EVERY 8 HOURS AS NEEDED 90 tablet 0  . lisinopril (PRINIVIL,ZESTRIL) 10 MG tablet  TAKE ONE TABLET BY MOUTH ONCE DAILY 30 tablet 5  . metoprolol succinate (TOPROL-XL) 25 MG 24 hr tablet Take 25 mg by mouth daily.     Marland Kitchen omeprazole (PRILOSEC) 20 MG capsule TAKE ONE CAPSULE BY MOUTH TWICE DAILY 60 capsule 5  . oxybutynin (DITROPAN) 5 MG tablet Take 1 tablet (5 mg total) by mouth every 8 (eight) hours as needed for bladder spasms. 90 tablet 3  . saccharomyces boulardii (FLORASTOR) 250 MG capsule Take 1 capsule (250 mg total) by mouth 2 (two) times daily. 20 capsule 0     . antiseptic oral rinse  7 mL Mouth Rinse 10 times per day  . chlorhexidine gluconate  15 mL Mouth Rinse BID  . enoxaparin (LOVENOX) injection  1 mg/kg Subcutaneous Q12H  . famotidine (PEPCID) IV  20 mg Intravenous Q12H  . insulin aspart  2-6 Units Subcutaneous 6 times per day  . norepinephrine (LEVOPHED) Adult infusion  0-40 mcg/min Intravenous Once  . piperacillin-tazobactam (ZOSYN)  IV  3.375 g Intravenous 3 times per day  . vancomycin  1,250 mg Intravenous Q18H    Infusions: . sodium chloride 150 mL/hr at 02/12/15 0409  . propofol (DIPRIVAN) infusion 20 mcg/kg/min (02/12/15 1093)    Allergies  Allergen Reactions  . Baclofen Diarrhea    Social History   Social History  . Marital  Status: Single    Spouse Name: N/A  . Number of Children: 1  . Years of Education: N/A   Occupational History  . disabled    Social History Main Topics  . Smoking status: Never Smoker   . Smokeless tobacco: Never Used  . Alcohol Use: No  . Drug Use: No  . Sexual Activity: Not on file   Other Topics Concern  . Not on file   Social History Narrative   Patient has 12 brothers and sister      Regular exercise trys to walks as tolerated      Diet poor, limited fruits and veggies, lots of water    Family History  Problem Relation Age of Onset  . Cancer Mother     multiple myeloma  . Diabetes Father   . Heart attack Father     PHYSICAL EXAM: Filed Vitals:   02/12/15 0500 02/12/15 0600  BP: 168/106  115/76  Pulse: 133 126  Temp: 103.8 F (39.9 C) 104.5 F (40.3 C)  Resp: 31 28     Intake/Output Summary (Last 24 hours) at 02/12/15 9741 Last data filed at 02/12/15 6384  Gross per 24 hour  Intake 360.17 ml  Output    450 ml  Net -89.83 ml    General:  Well appearing. No respiratory difficulty HEENT: normal Neck: supple. no JVD. Carotids 2+ bilat; no bruits. No lymphadenopathy or thryomegaly appreciated. Cor: PMI nondisplaced. Regular rate & rhythm. No rubs, gallops or murmurs. Lungs: clear Abdomen: soft, nontender, nondistended. No hepatosplenomegaly. No bruits or masses. Good bowel sounds. Extremities: no cyanosis, clubbing, rash, edema Neuro: alert & oriented x 3, cranial nerves grossly intact. moves all 4 extremities w/o difficulty. Affect pleasant.  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 no ST-T changes suggestive of ischemia  Results for orders placed or performed during the hospital encounter of 02/12/15 (from the past 24 hour(s))  Lactic acid, plasma     Status: Abnormal   Collection Time: 02/12/15  1:04 AM  Result Value Ref Range   Lactic Acid, Venous 2.8 (HH) 0.5 - 2.0 mmol/L  Comprehensive metabolic panel     Status: Abnormal   Collection Time: 02/12/15  1:04 AM  Result Value Ref Range   Sodium 134 (L) 135 - 145 mmol/L   Potassium 4.2 3.5 - 5.1 mmol/L   Chloride 105 101 - 111 mmol/L   CO2 19 (L) 22 - 32 mmol/L   Glucose, Bld 254 (H) 65 - 99 mg/dL   BUN 30 (H) 6 - 20 mg/dL   Creatinine, Ser 1.78 (H) 0.61 - 1.24 mg/dL   Calcium 8.6 (L) 8.9 - 10.3 mg/dL   Total Protein 7.9 6.5 - 8.1 g/dL   Albumin 3.5 3.5 - 5.0 g/dL   AST 33 15 - 41 U/L   ALT 34 17 - 63 U/L   Alkaline Phosphatase 105 38 - 126 U/L   Total Bilirubin 1.2 0.3 - 1.2 mg/dL   GFR calc non Af Amer 42 (L) >60 mL/min   GFR calc Af Amer 48 (L) >60 mL/min   Anion gap 10 5 - 15  Lipase, blood     Status: None   Collection Time: 02/12/15  1:04 AM   Result Value Ref Range   Lipase 15 11 - 51 U/L  Troponin I     Status: Abnormal   Collection Time: 02/12/15  1:04 AM  Result Value Ref Range   Troponin I  0.20 (H) <0.031 ng/mL  CBC WITH DIFFERENTIAL     Status: Abnormal   Collection Time: 02/12/15  1:04 AM  Result Value Ref Range   WBC 28.6 (H) 3.8 - 10.6 K/uL   RBC 5.62 4.40 - 5.90 MIL/uL   Hemoglobin 14.2 13.0 - 18.0 g/dL   HCT 45.3 40.0 - 52.0 %   MCV 80.6 80.0 - 100.0 fL   MCH 25.3 (L) 26.0 - 34.0 pg   MCHC 31.3 (L) 32.0 - 36.0 g/dL   RDW 16.4 (H) 11.5 - 14.5 %   Platelets 512 (H) 150 - 440 K/uL   Neutrophils Relative % 81 %   Lymphocytes Relative 7 %   Monocytes Relative 11 %   Eosinophils Relative 0 %   Basophils Relative 1 %   Neutro Abs 23.2 (H) 1.4 - 6.5 K/uL   Lymphs Abs 2.0 1.0 - 3.6 K/uL   Monocytes Absolute 3.1 (H) 0.2 - 1.0 K/uL   Eosinophils Absolute 0.0 0 - 0.7 K/uL   Basophils Absolute 0.3 (H) 0 - 0.1 K/uL   RBC Morphology MIXED RBC POPULATION    Smear Review LARGE PLATELETS PRESENT   APTT     Status: Abnormal   Collection Time: 02/12/15  1:04 AM  Result Value Ref Range   aPTT 39 (H) 24 - 36 seconds  Protime-INR     Status: Abnormal   Collection Time: 02/12/15  1:04 AM  Result Value Ref Range   Prothrombin Time 16.3 (H) 11.4 - 15.0 seconds   INR 1.53   Salicylate level     Status: None   Collection Time: 02/12/15  1:04 AM  Result Value Ref Range   Salicylate Lvl <7.9 2.8 - 30.0 mg/dL  Acetaminophen level     Status: Abnormal   Collection Time: 02/12/15  1:04 AM  Result Value Ref Range   Acetaminophen (Tylenol), Serum <10 (L) 10 - 30 ug/mL  Procalcitonin     Status: None   Collection Time: 02/12/15  1:04 AM  Result Value Ref Range   Procalcitonin 27.14 ng/mL  Blood gas, arterial ([facility], [facility], [facility])     Status: Abnormal   Collection Time: 02/12/15  1:23 AM  Result Value Ref Range   FIO2 1.00    Delivery systems VENTILATOR    Mode ASSIST CONTROL    VT 450 mL   Peep/cpap 5.0 cm H20   pH, Arterial  7.37 7.350 - 7.450   pCO2 arterial 30 (L) 32.0 - 48.0 mmHg   pO2, Arterial 153 (H) 83.0 - 108.0 mmHg   Bicarbonate 17.3 (L) 21.0 - 28.0 mEq/L   Acid-base deficit 6.7 (H) 0.0 - 2.0 mmol/L   O2 Saturation 99.3 %   Patient temperature 37.0    Collection site LEFT RADIAL    Sample type ARTERIAL DRAW    Allens test (pass/fail) PASS PASS   Mechanical Rate 28   Urinalysis complete, with microscopic ([facility] only)     Status: Abnormal   Collection Time: 02/12/15  2:18 AM  Result Value Ref Range   Color, Urine AMBER (A) YELLOW   APPearance CLOUDY (A) CLEAR   Glucose, UA 50 (A) NEGATIVE mg/dL   Bilirubin Urine NEGATIVE NEGATIVE   Ketones, ur NEGATIVE NEGATIVE mg/dL   Specific Gravity, Urine 1.029 1.005 - 1.030   Hgb urine dipstick 3+ (A) NEGATIVE   pH 5.0 5.0 - 8.0   Protein, ur 100 (A) NEGATIVE mg/dL   Nitrite NEGATIVE NEGATIVE   Leukocytes, UA 2+ (A) NEGATIVE  RBC / HPF TOO NUMEROUS TO COUNT 0 - 5 RBC/hpf   WBC, UA TOO NUMEROUS TO COUNT 0 - 5 WBC/hpf   Bacteria, UA RARE (A) NONE SEEN   Squamous Epithelial / LPF 0-5 (A) NONE SEEN   WBC Clumps PRESENT    Mucous PRESENT    Hyaline Casts, UA PRESENT    Granular Casts, UA PRESENT   Lactic acid, plasma     Status: Abnormal   Collection Time: 02/12/15  4:24 AM  Result Value Ref Range   Lactic Acid, Venous 3.8 (HH) 0.5 - 2.0 mmol/L  Troponin I (q 6hr x 3)     Status: Abnormal   Collection Time: 02/12/15  4:24 AM  Result Value Ref Range   Troponin I 0.25 (H) <0.031 ng/mL  Triglycerides     Status: None   Collection Time: 02/12/15  4:24 AM  Result Value Ref Range   Triglycerides 105 <150 mg/dL  Glucose, capillary     Status: Abnormal   Collection Time: 02/12/15  5:07 AM  Result Value Ref Range   Glucose-Capillary 182 (H) 65 - 99 mg/dL  MRSA PCR Screening     Status: None   Collection Time: 02/12/15  5:18 AM  Result Value Ref Range   MRSA by PCR NEGATIVE NEGATIVE   Dg Abd 1 View  02/12/2015  CLINICAL DATA:  55 year old male with  enteric tube placement. Check for tube position. EXAM: ABDOMEN - 1 VIEW COMPARISON:  Abdominal CT dated 03/03/2004 FINDINGS: An enteric tube is partially visualized with tip in the upper abdomen and to the right of the superior endplate of the L1 vertebra. No bowel dilatation identified. A 1.3 cm radiopaque density over the right renal silhouette may represent a kidney stone. There is mild haziness of the left lung base which may represent atelectatic changes. Pneumonia is not excluded. IMPRESSION: Enteric tube the tip in the upper abdomen.  No bowel dilatation. Probable right renal calculus. Electronically Signed   By: Anner Crete M.D.   On: 02/12/2015 06:49   Dg Chest Portable 1 View  02/12/2015  CLINICAL DATA:  Central line placement.  Initial encounter. EXAM: PORTABLE CHEST 1 VIEW COMPARISON:  Chest radiograph performed earlier today at 1:14 a.m. FINDINGS: The patient's endotracheal tube is seen ending 3 cm above the carina. A right IJ line is noted ending about the mid SVC. A small left pleural effusion is noted, apparently new from the prior study. Bilateral central airspace opacities could reflect mild interstitial edema. No pneumothorax is seen. The cardiomediastinal silhouette is borderline normal in size. No acute osseous abnormalities are identified. IMPRESSION: 1. Endotracheal tube seen ending 3 cm above the carina. 2. Right IJ line noted ending about the mid SVC. 3. Small left pleural effusion, apparently new from the prior study. Bilateral central airspace opacification could reflect mild interstitial edema. Electronically Signed   By: Garald Balding M.D.   On: 02/12/2015 02:34   Dg Chest Portable 1 View  02/12/2015  CLINICAL DATA:  55 year old male found unresponsive. Status post intubation. EXAM: PORTABLE CHEST 1 VIEW COMPARISON:  Radiograph dated 11/03/2014 FINDINGS: Endotracheal tube with tip approximately 4.5 cm above the carina. The lungs are clear. No pleural effusion or pneumothorax.  The cardiac silhouette is within normal limits. The osseous structures appear unremarkable. IMPRESSION: Endotracheal tube above the carina. Electronically Signed   By: Anner Crete M.D.   On: 02/12/2015 01:41     ASSESSMENT AND PLAN: Mildly elevated troponin in the setting of septic  shock with sinus tachycardia. Patient remains intubated. Mildly elevated troponin due to underlying septic shock, will get echocardiogram to further evaluate ejection fraction and wall motion.  Nuri Branca A

## 2015-02-12 NOTE — Progress Notes (Signed)
Patient B/P trending down, now at 79/60, MAP 67. Spoke with Dr. Ardyth Man, MD order for 1 L NS bolus, if after bolus, patient still hypotensive, initiate Levophed.

## 2015-02-12 NOTE — ED Notes (Addendum)
Pt bib EMS w/ c/o unresponsive.  Per EMS pt was released Tuesday for UTI.  Per EMS, pt moaning resp, sluggish but reactive pupils, rhonchi in all lung fields.  Per EMS HR 140, CGB 250, BP 162/106. 107 F tympanic temp, 32 resp.

## 2015-02-12 NOTE — ED Notes (Signed)
MD at bedside. 

## 2015-02-12 NOTE — ED Notes (Signed)
Patient biting ETT, Dr. Tobi Bastos notified. Propofol ordered and given as ordered.

## 2015-02-13 ENCOUNTER — Inpatient Hospital Stay: Payer: Medicare Other

## 2015-02-13 DIAGNOSIS — R6510 Systemic inflammatory response syndrome (SIRS) of non-infectious origin without acute organ dysfunction: Secondary | ICD-10-CM | POA: Diagnosis not present

## 2015-02-13 DIAGNOSIS — L89322 Pressure ulcer of left buttock, stage 2: Secondary | ICD-10-CM | POA: Diagnosis not present

## 2015-02-13 DIAGNOSIS — L89312 Pressure ulcer of right buttock, stage 2: Secondary | ICD-10-CM | POA: Diagnosis not present

## 2015-02-13 DIAGNOSIS — N390 Urinary tract infection, site not specified: Secondary | ICD-10-CM | POA: Diagnosis not present

## 2015-02-13 LAB — CBC
HEMATOCRIT: 35.5 % — AB (ref 40.0–52.0)
HEMOGLOBIN: 11.3 g/dL — AB (ref 13.0–18.0)
MCH: 25.4 pg — ABNORMAL LOW (ref 26.0–34.0)
MCHC: 31.9 g/dL — AB (ref 32.0–36.0)
MCV: 79.6 fL — ABNORMAL LOW (ref 80.0–100.0)
Platelets: 243 10*3/uL (ref 150–440)
RBC: 4.45 MIL/uL (ref 4.40–5.90)
RDW: 16.5 % — ABNORMAL HIGH (ref 11.5–14.5)
WBC: 15.1 10*3/uL — AB (ref 3.8–10.6)

## 2015-02-13 LAB — BLOOD GAS, ARTERIAL
ACID-BASE DEFICIT: 4.8 mmol/L — AB (ref 0.0–2.0)
Acid-base deficit: 7.6 mmol/L — ABNORMAL HIGH (ref 0.0–2.0)
Acid-base deficit: 3.2 mmol/L — ABNORMAL HIGH (ref 0.0–2.0)
Allens test (pass/fail): POSITIVE — AB
Allens test (pass/fail): POSITIVE — AB
BICARBONATE: 23.6 meq/L (ref 21.0–28.0)
Bicarbonate: 21.1 mEq/L (ref 21.0–28.0)
Bicarbonate: 21.1 mEq/L (ref 21.0–28.0)
FIO2: 0.4
FIO2: 0.35
FIO2: 0.35
LHR: 20 {breaths}/min
LHR: 15 {breaths}/min
MODE: POSITIVE
O2 Saturation: 95.8 %
O2 Saturation: 90.7 %
O2 Saturation: 97.1 %
PATIENT TEMPERATURE: 37
PEEP/CPAP: 5 cmH2O
PEEP/CPAP: 5 cmH2O
PEEP/CPAP: 5 cmH2O
PH ART: 7.32 — AB (ref 7.350–7.450)
PO2 ART: 74 mmHg — AB (ref 83.0–108.0)
PRESSURE SUPPORT: 5 cmH2O
Patient temperature: 37
Patient temperature: 37
Pressure control: 14 cmH2O
VT: 500 mL
pCO2 arterial: 41 mmHg (ref 32.0–48.0)
pCO2 arterial: 54 mmHg — ABNORMAL HIGH (ref 32.0–48.0)
pCO2 arterial: 48 mmHg (ref 32.0–48.0)
pH, Arterial: 7.2 — ABNORMAL LOW (ref 7.350–7.450)
pH, Arterial: 7.3 — ABNORMAL LOW (ref 7.350–7.450)
pO2, Arterial: 87 mmHg (ref 83.0–108.0)
pO2, Arterial: 101 mmHg (ref 83.0–108.0)

## 2015-02-13 LAB — GLUCOSE, CAPILLARY
GLUCOSE-CAPILLARY: 125 mg/dL — AB (ref 65–99)
GLUCOSE-CAPILLARY: 131 mg/dL — AB (ref 65–99)
GLUCOSE-CAPILLARY: 141 mg/dL — AB (ref 65–99)
Glucose-Capillary: 152 mg/dL — ABNORMAL HIGH (ref 65–99)
Glucose-Capillary: 129 mg/dL — ABNORMAL HIGH (ref 65–99)
Glucose-Capillary: 131 mg/dL — ABNORMAL HIGH (ref 65–99)
Glucose-Capillary: 157 mg/dL — ABNORMAL HIGH (ref 65–99)

## 2015-02-13 LAB — BASIC METABOLIC PANEL
ANION GAP: 6 (ref 5–15)
BUN: 23 mg/dL — ABNORMAL HIGH (ref 6–20)
CHLORIDE: 113 mmol/L — AB (ref 101–111)
CO2: 23 mmol/L (ref 22–32)
CREATININE: 0.98 mg/dL (ref 0.61–1.24)
Calcium: 7.7 mg/dL — ABNORMAL LOW (ref 8.9–10.3)
GFR calc non Af Amer: >60 mL/min (ref >60)
Glucose, Bld: 171 mg/dL — ABNORMAL HIGH (ref 65–99)
POTASSIUM: 4.1 mmol/L (ref 3.5–5.1)
SODIUM: 135 mmol/L (ref 135–145)

## 2015-02-13 LAB — EXPECTORATED SPUTUM ASSESSMENT W REFEX TO RESP CULTURE

## 2015-02-13 LAB — MAGNESIUM: MAGNESIUM: 1.7 mg/dL (ref 1.7–2.4)

## 2015-02-13 LAB — PHOSPHORUS: PHOSPHORUS: 1.4 mg/dL — AB (ref 2.5–4.6)

## 2015-02-13 MED ORDER — AMLODIPINE 1 MG/ML ORAL SUSPENSION: 10.0000 mg | Freq: Every day | ORAL | Status: DC

## 2015-02-13 MED ORDER — VANCOMYCIN HCL 10 G IV SOLR
1500.0000 mg | Freq: Two times a day (BID) | INTRAVENOUS | Status: DC
  Administered 2015-02-13 – 2015-02-14 (×2): 1500 mg via INTRAVENOUS
  Filled 2015-02-13 (×3): qty 1500

## 2015-02-13 MED ORDER — LISINOPRIL 10 MG PO TABS
10.0000 mg | ORAL_TABLET | Freq: Every day | ORAL | Status: DC
  Administered 2015-02-13 – 2015-02-14 (×2): 10 mg via ORAL
  Filled 2015-02-13 (×2): qty 1

## 2015-02-13 MED ORDER — ENALAPRILAT 1.25 MG/ML IV SOLN
1.2500 mg | Freq: Four times a day (QID) | INTRAVENOUS | Status: DC | PRN
  Administered 2015-02-13 – 2015-02-14 (×2): 1.25 mg via INTRAVENOUS
  Filled 2015-02-13 (×4): qty 1

## 2015-02-13 MED ORDER — VITAL 1.5 CAL PO LIQD
1000.0000 mL | ORAL | Status: DC
  Administered 2015-02-13: 1000 mL

## 2015-02-13 MED ORDER — K PHOS MONO-SOD PHOS DI & MONO 155-852-130 MG PO TABS
500.0000 mg | ORAL_TABLET | ORAL | Status: AC
  Administered 2015-02-13 (×2): 500 mg via ORAL
  Filled 2015-02-13 (×2): qty 2

## 2015-02-13 MED ORDER — AMLODIPINE BESYLATE 10 MG PO TABS
10.0000 mg | ORAL_TABLET | Freq: Every day | ORAL | Status: DC
  Administered 2015-02-13 – 2015-02-14 (×2): 10 mg via ORAL
  Filled 2015-02-13 (×2): qty 1

## 2015-02-13 NOTE — Consult Note (Signed)
[facility] Consultation     ASSESSMENT/PLAN   55 year old male with multiple sclerosis and chronic indwelling Foley catheter with history of previous UTI, now presents with septic shock, vent dependent respiratory failure.  PULMONARY A: Acute respiratory failure-ventilator dependent. This is likely due to septic shock, possibly pneumonia. P:   -Continue broad-spectrum antibiotics, will check sputum cultures. -A weaning trial attempted, but patient did not appear to tolerate this today, and developed hypercapnia on blood gas.   INFECTIOUS A:  Severe septic shock with leukocytosis, febrile. Suspect UTI as source. -Decubitus ulcer. -Continue fluid resuscitation, continue broad-spectrum antibiotics, monitor CBC and lites periodically. -We'll await culture results and adjust antibiotics accordingly.  MRSA screen negative BCx2 1/19: Pending UC 1/19: Pending Sputum 1/19: Pending  Zosyn 1/18>> Vancomycin 1/18>>  CARDIOVASCULAR A:--  RENAL A: Acute kidney injury, likely due to septic shock. -We'll continue to monitor kidney function. Continue fluid resuscitation.  GASTROINTESTINAL A:  Continue GI prophylaxis with famotidine.  HEMATOLOGIC A:  --    ENDOCRINE A:  Hyperglycemia. -Sliding scale insulin.  NEUROLOGIC A:  Encephalopathy secondary to septic shock.   MAJOR EVENTS/TEST RESULTS: Intubated 1/18.  INDWELLING DEVICES::  Left EJ triple-lumen, 1/19.   ---------------------------------------  ---------------------------------------   Name: JAC ROMULUS MRN: [id number] DOB: 1960-12-16    ADMISSION DATE:  02/12/2015 CONSULTATION DATE:  02/12/2015  REFERRING MD :  Dr. Posey Pronto  CHIEF COMPLAINT:  dyspnea   HISTORY OF PRESENT ILLNESS:   The patient is currently intubated on the ventilator. Therefore, all history is obtained from the chart and from staff. The patient is a 55 year old male with a history of multiple sclerosis. He was  recently discharged from the hospital on January 8, at that time it was noted that he had a UTI and acute kidney injury, he also had a 1.5 cm stage III decubitus ulcer. He has an indwelling Foley catheter, he has a history of Proteus growing in the urine.  he is minimally ambulatory. He lives with a friend who he pays apparently to take care of him  Upon presentation to the emergency room it was noted that the patient had acute kidney injury with a creatinine of 1.78, which was increased from 1.12 on the previous admission, he also had leukocytosis with a WBC count of 28.6. Initial ABG showed a pH of 7.37/30/153/17.3. Venous lactate was 3.8 I reviewed the chest x-ray report and images and report 1/19: This appeared to show increased right middle lobe/hilar infiltrate. Per report, the patient had pus appearing urine in the foley as well as pus around the meatus.   PAST MEDICAL HISTORY :  Past Medical History  Diagnosis Date  . Allergy   . GERD (gastroesophageal reflux disease)   . MS (multiple sclerosis) ([place])   . Hypertension   . Decubitus ulcer    Past Surgical History  Procedure Laterality Date  . Vein reconsruction    . Rotator cuff repair     Prior to Admission medications   Medication Sig Start Date End Date Taking? Authorizing Provider  acetaminophen (TYLENOL) 325 MG tablet Take 2 tablets (650 mg total) by mouth every 6 (six) hours as needed for mild pain (or Fever >/= 101). 08/04/14   Nicholes Mango, MD  amLODipine (NORVASC) 5 MG tablet Take 1 tablet (5 mg total) by mouth daily. 06/18/14   Nicholes Mango, MD  atorvastatin (LIPITOR) 40 MG tablet Take 1 tablet (40 mg total) by mouth daily. 07/02/14   Amy Cletis Athens, MD  cephALEXin (KEFLEX) 500 MG capsule Take 1 capsule (500 mg total) by mouth every 12 (twelve) hours. 02/02/15   Fritzi Mandes, MD  Cholecalciferol (VITAMIN D-3 PO) Take 1 tablet by mouth daily.     Historical Provider, MD  diazepam (VALIUM) 5 MG tablet TAKE ONE TABLET BY MOUTH EVERY 12  HOURS 01/04/15   Amy Cletis Athens, MD  docusate sodium (COLACE) 100 MG capsule Take 1 capsule (100 mg total) by mouth 2 (two) times daily. 06/18/14   Nicholes Mango, MD  feeding supplement, ENSURE ENLIVE, (ENSURE ENLIVE) LIQD Take 237 mLs by mouth 2 (two) times daily with a meal. 08/04/14   Nicholes Mango, MD  FLUoxetine (PROZAC) 20 MG capsule TAKE TWO CAPSULES BY MOUTH IN THE MORNING AND ONE IN THE EVENING 10/27/14   Amy E Bedsole, MD  gabapentin (NEURONTIN) 300 MG capsule Take 3 capsules (900 mg total) by mouth 3 (three) times daily. 12/16/14   Amy Cletis Athens, MD  ibuprofen (ADVIL,MOTRIN) 800 MG tablet TAKE ONE TABLET BY MOUTH EVERY 8 HOURS AS NEEDED 01/08/15   Amy E Diona Browner, MD  lisinopril (PRINIVIL,ZESTRIL) 10 MG tablet TAKE ONE TABLET BY MOUTH ONCE DAILY 09/03/14   Amy Cletis Athens, MD  metoprolol succinate (TOPROL-XL) 25 MG 24 hr tablet Take 25 mg by mouth daily.     Historical Provider, MD  omeprazole (PRILOSEC) 20 MG capsule TAKE ONE CAPSULE BY MOUTH TWICE DAILY 09/25/14   Amy Cletis Athens, MD  oxybutynin (DITROPAN) 5 MG tablet Take 1 tablet (5 mg total) by mouth every 8 (eight) hours as needed for bladder spasms. 10/17/14   Amy Cletis Athens, MD  saccharomyces boulardii (FLORASTOR) 250 MG capsule Take 1 capsule (250 mg total) by mouth 2 (two) times daily. 06/18/14   Nicholes Mango, MD   Allergies  Allergen Reactions  . Baclofen Diarrhea    FAMILY HISTORY:  Family History  Problem Relation Age of Onset  . Cancer Mother     multiple myeloma  . Diabetes Father   . Heart attack Father    SOCIAL HISTORY:  reports that he has never smoked. He has never used smokeless tobacco. He reports that he does not drink alcohol or use illicit drugs.  REVIEW OF SYSTEMS:   Patient cannot provide a review of systems as he is currently on the ventilator.  VITAL SIGNS: Temp:  [98.6 F (37 C)-101.8 F (38.8 C)] 99.1 F (37.3 C) (01/20 0700) Pulse Rate:  [83-112] 84 (01/20 0800) Resp:  [15-21] 15 (01/20 0800) BP:  (75-180)/(61-102) 146/100 mmHg (01/20 1156) SpO2:  [96 %-100 %] 100 % (01/20 0800) FiO2 (%):  [40 %] 40 % (01/20 1100) Weight:  [207 lb 7.3 oz (94.1 kg)] 207 lb 7.3 oz (94.1 kg) (01/20 0500) HEMODYNAMICS:   VENTILATOR SETTINGS: Vent Mode:  [-] CPAP FiO2 (%):  [40 %] 40 % Set Rate:  [20 bmp] 20 bmp PEEP:  [5 cmH20] 5 cmH20 Plateau Pressure:  [20 cmH20] 20 cmH20 INTAKE / OUTPUT:  Intake/Output Summary (Last 24 hours) at 02/13/15 1315 Last data filed at 02/13/15 0815  Gross per 24 hour  Intake 1819.64 ml  Output   3825 ml  Net -2005.36 ml    Physical Examination:   VS: BP 146/100 mmHg  Pulse 84  Temp(Src) 99.1 F (37.3 C) (Other (Comment))  Resp 15  Ht 6' (1.829 m)  Wt 207 lb 7.3 oz (94.1 kg)  BMI 28.13 kg/m2  SpO2 100%  General Appearance: No distress  Neuro:without focal findings, mental  status, HEENT: PERRLA, EOM intact,  Pulmonary: normal breath sounds., diaphragmatic excursion normal.   CardiovascularNormal S1,S2.  No m/r/g.    Abdomen: Benign, Soft, non-tender, No masses,  Renal:  No costovertebral tenderness  GU:  Not performed at this time. Endoc: No evident thyromegaly, no signs of acromegaly. Skin:   warm, no rashes, no ecchymosis . Skin appears to be unkept, hygiene appears poor, nails are unkept, dirty. Extremities: Significant bilateral lower extremity excoriation. reduced capillary refill.    LABS: Reviewed   LABORATORY PANEL:   CBC  Recent Labs Lab 02/13/15 0358  WBC 15.1*  HGB 11.3*  HCT 35.5*  PLT 243    Chemistries   Recent Labs Lab 02/12/15 0104 02/13/15 0358  NA 134* 135  K 4.2 4.1  CL 105 113*  CO2 19* 23  GLUCOSE 254* 171*  BUN 30* 23*  CREATININE 1.78* 0.98  CALCIUM 8.6* 7.7*  MG  --  1.7  PHOS  --  1.4*  AST 33  --   ALT 34  --   ALKPHOS 105  --   BILITOT 1.2  --      Recent Labs Lab 02/12/15 1630 02/12/15 2016 02/13/15 0003 02/13/15 0513 02/13/15 0737 02/13/15 1125  GLUCAP 128* 165* 141* 152* 131* 129*     Recent Labs Lab 02/12/15 0123 02/13/15 0512 02/13/15 1135  PHART 7.37 7.32* 7.20*  PCO2ART 30* 41 54*  PO2ART 153* 87 74*    Recent Labs Lab 02/12/15 0104  AST 33  ALT 34  ALKPHOS 105  BILITOT 1.2  ALBUMIN 3.5    Cardiac Enzymes  Recent Labs Lab 02/12/15 0424  TROPONINI 0.25*    RADIOLOGY:  Dg Chest 1 View  02/13/2015  CLINICAL DATA:  Shortness of breath. EXAM: CHEST 1 VIEW COMPARISON:  02/12/2015 . FINDINGS: Endotracheal tube, NG tube, right IJ line in stable position. Cardiomegaly with pulmonary vascular prominence and bilateral interstitial prominence consistent mild congestive heart failure. No pleural effusion or pneumothorax. IMPRESSION: 1. Lines and tubes stable position. 2. Cardiomegaly with bilateral pulmonary interstitial prominence consistent with congestive heart failure with pulmonary interstitial edema. Findings have progressed from prior exam. Electronically Signed   By: Marcello Moores  Register   On: 02/13/2015 07:47   Dg Abd 1 View  02/12/2015  CLINICAL DATA:  55 year old male with enteric tube placement. Check for tube position. EXAM: ABDOMEN - 1 VIEW COMPARISON:  Abdominal CT dated 03/03/2004 FINDINGS: An enteric tube is partially visualized with tip in the upper abdomen and to the right of the superior endplate of the L1 vertebra. No bowel dilatation identified. A 1.3 cm radiopaque density over the right renal silhouette may represent a kidney stone. There is mild haziness of the left lung base which may represent atelectatic changes. Pneumonia is not excluded. IMPRESSION: Enteric tube the tip in the upper abdomen.  No bowel dilatation. Probable right renal calculus. Electronically Signed   By: Anner Crete M.D.   On: 02/12/2015 06:49   Dg Chest Portable 1 View  02/12/2015  CLINICAL DATA:  Central line placement.  Initial encounter. EXAM: PORTABLE CHEST 1 VIEW COMPARISON:  Chest radiograph performed earlier today at 1:14 a.m. FINDINGS: The patient's  endotracheal tube is seen ending 3 cm above the carina. A right IJ line is noted ending about the mid SVC. A small left pleural effusion is noted, apparently new from the prior study. Bilateral central airspace opacities could reflect mild interstitial edema. No pneumothorax is seen. The cardiomediastinal silhouette is borderline normal in  size. No acute osseous abnormalities are identified. IMPRESSION: 1. Endotracheal tube seen ending 3 cm above the carina. 2. Right IJ line noted ending about the mid SVC. 3. Small left pleural effusion, apparently new from the prior study. Bilateral central airspace opacification could reflect mild interstitial edema. Electronically Signed   By: Garald Balding M.D.   On: 02/12/2015 02:34   Dg Chest Portable 1 View  02/12/2015  CLINICAL DATA:  55 year old male found unresponsive. Status post intubation. EXAM: PORTABLE CHEST 1 VIEW COMPARISON:  Radiograph dated 11/03/2014 FINDINGS: Endotracheal tube with tip approximately 4.5 cm above the carina. The lungs are clear. No pleural effusion or pneumothorax. The cardiac silhouette is within normal limits. The osseous structures appear unremarkable. IMPRESSION: Endotracheal tube above the carina. Electronically Signed   By: Anner Crete M.D.   On: 02/12/2015 01:41       --Marda Stalker, MD.  Board Certified in Internal Medicine, Pulmonary Medicine, [place], and Sleep Medicine.  Pager [phone] [facility] [place]  Patricia Pesa, M.D.  Vilinda Boehringer, M.D.  Merton Border, M.D   02/13/2015, 1:15 PM  Critical Care Attestation.  I have personally obtained a history, examined the patient, evaluated laboratory and imaging results, formulated the assessment and plan and placed orders. The Patient requires high complexity decision making for assessment and support, frequent evaluation and titration of therapies, application of advanced monitoring technologies and extensive interpretation  of multiple databases. The patient has critical illness that could lead imminently to failure of 1 or more organ systems and requires the highest level of physician preparedness to intervene.  Critical Care Time devoted to patient care services described in this note is 35 minutes and is exclusive of time spent in procedures.

## 2015-02-13 NOTE — Progress Notes (Signed)
ANTIBIOTIC CONSULT NOTE - INITIAL  Pharmacy Consult for Zosyn/vancomycin Indication: rule out sepsis  Allergies  Allergen Reactions  . Baclofen Diarrhea    Patient Measurements: Height: 6' (182.9 cm) Weight: 207 lb 7.3 oz (94.1 kg) IBW/kg (Calculated) : 77.6 Adjusted Body Weight:   Vital Signs: Temp: 99.1 F (37.3 C) (01/20 0700) Temp Source: Other (Comment) (01/20 0700) BP: 146/100 mmHg (01/20 1156) Pulse Rate: 84 (01/20 0800) Intake/Output from previous day: 01/19 0701 - 01/20 0700 In: 1819.6 [I.V.:1202.6; NG/GT:417; IV Piggyback:200] Out: 3425 [Urine:3425] Intake/Output from this shift: Total I/O In: -  Out: 400 [Urine:400]  Labs:  Recent Labs  02/12/15 0104 02/13/15 0358  WBC 28.6* 15.1*  HGB 14.2 11.3*  PLT 512* 243  CREATININE 1.78* 0.98   Estimated Creatinine Clearance: 102.6 mL/min (by C-G formula based on Cr of 0.98). No results for input(s): VANCOTROUGH, VANCOPEAK, VANCORANDOM, GENTTROUGH, GENTPEAK, GENTRANDOM, TOBRATROUGH, TOBRAPEAK, TOBRARND, AMIKACINPEAK, AMIKACINTROU, AMIKACIN in the last 72 hours.   Microbiology: Recent Results (from the past 720 hour(s))  Blood culture (routine x 2)     Status: None   Collection Time: 01/29/15  5:30 AM  Result Value Ref Range Status   Specimen Description BLOOD LEFT AC  Final   Special Requests   Final    BOTTLES DRAWN AEROBIC AND ANAEROBIC ANAEROBIC AEROBIC   Culture NO GROWTH 5 DAYS  Final   Report Status 02/03/2015 FINAL  Final  Blood culture (routine x 2)     Status: None   Collection Time: 01/29/15  5:40 AM  Result Value Ref Range Status   Specimen Description BLOOD RIGHT AC  Final   Special Requests   Final    BOTTLES DRAWN AEROBIC AND ANAEROBIC ANAEROBIC AEROBIC   Culture NO GROWTH 5 DAYS  Final   Report Status 02/03/2015 FINAL  Final  Urine culture     Status: None   Collection Time: 01/29/15  5:43 AM  Result Value Ref Range Status   Specimen Description URINE, CATHETERIZED   Final   Special Requests NONE  Final   Culture >=100,000 COLONIES/mL PROTEUS MIRABILIS  Final   Report Status 01/31/2015 FINAL  Final   Organism ID, Bacteria PROTEUS MIRABILIS  Final      Susceptibility   Proteus mirabilis - MIC*    AMPICILLIN 16 INTERMEDIATE Intermediate     CEFTAZIDIME <=1 SENSITIVE Sensitive     CEFAZOLIN 8 SENSITIVE Sensitive     CEFTRIAXONE <=1 SENSITIVE Sensitive     CIPROFLOXACIN >=4 RESISTANT Resistant     GENTAMICIN <=1 SENSITIVE Sensitive     IMIPENEM >=16 RESISTANT Resistant     TRIMETH/SULFA 160 RESISTANT Resistant     PIP/TAZO Value in next row Sensitive      SENSITIVE<=4    * >=100,000 COLONIES/mL PROTEUS MIRABILIS  MRSA PCR Screening     Status: None   Collection Time: 01/29/15 12:50 PM  Result Value Ref Range Status   MRSA by PCR NEGATIVE NEGATIVE Final    Comment:        The GeneXpert MRSA Assay (FDA approved for NASAL specimens only), is one component of a comprehensive MRSA colonization surveillance program. It is not intended to diagnose MRSA infection nor to guide or monitor treatment for MRSA infections.   Blood Culture (routine x 2)     Status: None (Preliminary result)   Collection Time: 02/12/15  1:04 AM  Result Value Ref Range Status   Specimen Description BLOOD RIGHT HAND  Final  Special Requests   Final    BOTTLES DRAWN AEROBIC AND ANAEROBIC 15MLANEROBIC,10MLAEROBIC   Culture  Setup Time   Final    GRAM POSITIVE COCCI IN BOTH AEROBIC AND ANAEROBIC BOTTLES CRITICAL RESULT CALLED TO, READ BACK BY AND VERIFIED WITH: NATE COOKSON PHARMACIST AT 2203 02/12/15 SDR Organism ID to follow    Culture   Final    GRAM POSITIVE COCCI IN BOTH AEROBIC AND ANAEROBIC BOTTLES IDENTIFICATION TO FOLLOW    Report Status PENDING  Incomplete  Blood Culture (routine x 2)     Status: None (Preliminary result)   Collection Time: 02/12/15  1:04 AM  Result Value Ref Range Status   Specimen Description BLOOD LEFT HAND  Final   Special Requests    Final    BOTTLES DRAWN AEROBIC AND ANAEROBIC 10AEROBIC,16MLANAEROBIC   Culture NO GROWTH 1 DAY  Final   Report Status PENDING  Incomplete  Blood Culture ID Panel (Reflexed)     Status: Abnormal   Collection Time: 02/12/15  1:04 AM  Result Value Ref Range Status   Enterococcus species NOT DETECTED NOT DETECTED Final   Listeria monocytogenes NOT DETECTED NOT DETECTED Final   Staphylococcus species DETECTED (A) NOT DETECTED Final    Comment: CRITICAL RESULT CALLED TO, READ BACK BY AND VERIFIED WITH: NATE COOKSON AT 2203 02/12/15 SDR    Staphylococcus aureus NOT DETECTED NOT DETECTED Final   Streptococcus species NOT DETECTED NOT DETECTED Final   Streptococcus agalactiae NOT DETECTED NOT DETECTED Final   Streptococcus pneumoniae NOT DETECTED NOT DETECTED Final   Streptococcus pyogenes NOT DETECTED NOT DETECTED Final   Acinetobacter baumannii NOT DETECTED NOT DETECTED Final   Enterobacteriaceae species NOT DETECTED NOT DETECTED Final   Enterobacter cloacae complex NOT DETECTED NOT DETECTED Final   Escherichia coli NOT DETECTED NOT DETECTED Final   Klebsiella oxytoca NOT DETECTED NOT DETECTED Final   Klebsiella pneumoniae NOT DETECTED NOT DETECTED Final   Proteus species NOT DETECTED NOT DETECTED Final   Serratia marcescens NOT DETECTED NOT DETECTED Final   Haemophilus influenzae NOT DETECTED NOT DETECTED Final   Neisseria meningitidis NOT DETECTED NOT DETECTED Final   Pseudomonas aeruginosa NOT DETECTED NOT DETECTED Final   Candida albicans NOT DETECTED NOT DETECTED Final   Candida glabrata NOT DETECTED NOT DETECTED Final   Candida krusei NOT DETECTED NOT DETECTED Final   Candida parapsilosis NOT DETECTED NOT DETECTED Final   Candida tropicalis NOT DETECTED NOT DETECTED Final   Carbapenem resistance NOT DETECTED NOT DETECTED Final   Methicillin resistance DETECTED (A) NOT DETECTED Final    Comment: CRITICAL RESULT CALLED TO, READ BACK BY AND VERIFIED WITH: NATE COOKSON AT 2203 02/12/15  SDR    Vancomycin resistance NOT DETECTED NOT DETECTED Final  Urine culture     Status: None (Preliminary result)   Collection Time: 02/12/15  2:18 AM  Result Value Ref Range Status   Specimen Description URINE, RANDOM  Final   Special Requests NONE  Final   Culture HOLDING FOR POSSIBLE PATHOGEN  Final   Report Status PENDING  Incomplete  MRSA PCR Screening     Status: None   Collection Time: 02/12/15  5:18 AM  Result Value Ref Range Status   MRSA by PCR NEGATIVE NEGATIVE Final    Comment:        The GeneXpert MRSA Assay (FDA approved for NASAL specimens only), is one component of a comprehensive MRSA colonization surveillance program. It is not intended to diagnose MRSA infection nor to guide or  monitor treatment for MRSA infections.   Culture, respiratory (NON-Expectorated)     Status: None (Preliminary result)   Collection Time: 02/12/15  9:24 AM  Result Value Ref Range Status   Specimen Description TRACHEAL ASPIRATE  Final   Special Requests NONE  Final   Gram Stain PENDING  Incomplete   Culture Consistent with normal respiratory flora.  Final   Report Status PENDING  Incomplete  Culture, expectorated sputum-assessment     Status: None   Collection Time: 02/12/15  9:24 AM  Result Value Ref Range Status   Sputum evaluation DUPLICATE REQUEST CREDITED  Final   Report Status 02/13/2015 FINAL  Final    Medical History: Past Medical History  Diagnosis Date  . Allergy   . GERD (gastroesophageal reflux disease)   . MS (multiple sclerosis) ([facility])   . Hypertension   . Decubitus ulcer     Medications:  Infusions:  . sodium chloride Stopped (02/13/15 0313)  . feeding supplement (VITAL 1.5 CAL) 1,000 mL (02/13/15 0300)  . propofol (DIPRIVAN) infusion 15 mcg/kg/min (02/13/15 0420)   Assessment: 54 yom found unresponsive, recently released (Tuesday) for UTI. Pharmacy consulted to dose vancomycin/Zosyn for sepsis. Vancomycin d/c then resumed due to BCID with Staph species  with mecA detected in 1/2 blood cultures.   Goal of Therapy:  Vancomycin trough level 15-20 mcg/ml  Plan:  SCr has improved significantly so will increase vancomycin dosing to 1500 mg iv q 12 hours and plan on checking a trough with the 5th dose.   Will continue Zosyn 3.375 g EI q 8 hours.  Will continue to monitor renal function and culture results.   Luisa Hart D, Pharm.D., BCPS Clinical Pharmacist 02/13/2015,12:31 PM

## 2015-02-13 NOTE — Progress Notes (Signed)
Chaplain rounded in the unit and offered a compassionate presence and support to the patient.  Said silent prayer. Chaplain Clifton James [phone]

## 2015-02-13 NOTE — Progress Notes (Signed)
Nutrition Follow-up   INTERVENTION:   EN: reassessed nutritional needs. With current diprivan, recommend decreasing TF to rate of 45 ml/hr, continue Prostat QID (133 g of protein, 2020 kcals). Additional kcals from diprivan. Continue to assess  NUTRITION DIAGNOSIS:   Inadequate oral intake related to acute illness as evidenced by NPO status. Being addressed via TF  GOAL:   Provide needs based on ASPEN/SCCM guidelines  MONITOR:    (Energy Intake, Anthropometrics, Digestive System, Electrolyte/Renal Profile, Glucose Profile)  REASON FOR ASSESSMENT:   Ventilator    ASSESSMENT:    Pt remains on vent, failed attempted weaning trial today, on cooling blanket  Diet Order:  Diet NPO time specified  EN:    Vital 1.5 at rate of 60 ml/hr, free water flush of 100 m: q 8 hours, Prostat QID  Digestive System: last BM 1/20, no signs of TF intolerance  Electrolyte and Renal Profile:  Recent Labs Lab 02/12/15 0104 02/13/15 0358  BUN 30* 23*  CREATININE 1.78* 0.98  NA 134* 135  K 4.2 4.1  MG  --  1.7  PHOS  --  1.4*   Glucose Profile:   Recent Labs  02/13/15 0513 02/13/15 0737 02/13/15 1125  GLUCAP 152* 131* 129*   Meds: NS at 150 ml/hr, ss novolog, diprivan (224 kcals in 24 hours at current wt)  Height:   Ht Readings from Last 1 Encounters:  02/12/15 6' (1.829 m)    Weight:   Wt Readings from Last 1 Encounters:  02/13/15 207 lb 7.3 oz (94.1 kg)    BMI:  Body mass index is 28.13 kg/(m^2).  Estimated Nutritional Needs:   Kcal:  2242 kcals (BEE 1814, Ve: 12.9, Tmax: 37.8) using wt of 94.1 kg  Protein:  134-178 g(1.5-2.0 g/kg)   Fluid:  2225-2670 mL (25-30 ml/kg)      HIGH Care Level  Romelle Starcher MS, RD, LDN [phone] Pager  [phone] Weekend/On-Call Pager

## 2015-02-13 NOTE — Progress Notes (Signed)
Metoprolol given

## 2015-02-13 NOTE — Progress Notes (Signed)
[facility] - [place] at [facility]                                                                                                                                                                                            Patient Demographics   Travis Cantrell, is a 55 y.o. male, DOB - 1960/11/22, EBX:[id number]  Admit date - 02/12/2015   Admitting Physician Ihor Austin, MD  Outpatient Primary MD for the patient is Kerby Nora, MD   LOS - 1  Subjective:  Patient remains intubated, and sedated.    Review of Systems:   CONSTITUTIONAL:  On the ventilator and sedated unable to provide    Vitals:   Filed Vitals:   02/13/15 1200 02/13/15 1300 02/13/15 1400 02/13/15 1500  BP: 157/101 126/81 127/98 143/88  Pulse: 94 88 93 94  Temp: 100.5 F (38.1 C)     TempSrc:      Resp: 19 20 17 18   Height:      Weight:      SpO2: 100% 99% 100% 100%    Wt Readings from Last 3 Encounters:  02/13/15 94.1 kg (207 lb 7.3 oz)  01/29/15 93.895 kg (207 lb)  01/03/15 97.523 kg (215 lb)     Intake/Output Summary (Last 24 hours) at 02/13/15 1625 Last data filed at 02/13/15 0815  Gross per 24 hour  Intake 819.64 ml  Output   3825 ml  Net -3005.36 ml    Physical Exam:   GENERAL: Critically ill-appearing male HEAD, EYES, EARS, NOSE AND THROAT: Atraumatic, normocephalic. . Pupils equal and reactive to light. Sclerae anicteric. No conjunctival injection. No oro-pharyngeal erythema.  NECK: Supple. There is no jugular venous distention. No bruits, no lymphadenopathy, no thyromegaly.  HEART: Regular rate and rhythm,. No murmurs, no rubs, no clicks.  LUNGS: Diminished breath sounds currently on the ventilator ABDOMEN: Soft, flat, nontender, nondistended. Has good bowel sounds. No hepatosplenomegaly appreciated.  EXTREMITIES: No evidence of any cyanosis, clubbing, or peripheral edema.  +2 pedal and radial pulses bilaterally.  NEUROLOGIC: Sedated  SKIN: Moist and warm with no  rashes appreciated. Decubitus ulcer stage II Psych: Sedated LN: No inguinal LN enlargement    Antibiotics   Anti-infectives    Start     Dose/Rate Route Frequency Ordered Stop   02/13/15 1500  vancomycin (VANCOCIN) 1,500 mg in sodium chloride 0.9 % 500 mL IVPB     1,500 mg 250 mL/hr over 120 Minutes Intravenous Every 12 hours 02/13/15 1237     02/13/15 0300  vancomycin (VANCOCIN) 1,250 mg in sodium chloride 0.9 % 250 mL IVPB  Status:  Discontinued     1,250 mg 166.7 mL/hr over 90 Minutes Intravenous Every 18 hours 02/12/15 2212 02/13/15 1237   02/12/15 1000  piperacillin-tazobactam (ZOSYN) IVPB 3.375 g     3.375 g 12.5 mL/hr over 240 Minutes Intravenous 3 times per day 02/12/15 0500     02/12/15 0900  vancomycin (VANCOCIN) 1,250 mg in sodium chloride 0.9 % 250 mL IVPB  Status:  Discontinued     1,250 mg 166.7 mL/hr over 90 Minutes Intravenous Every 18 hours 02/12/15 0624 02/12/15 1416   02/12/15 0800  vancomycin (VANCOCIN) 1,250 mg in sodium chloride 0.9 % 250 mL IVPB  Status:  Discontinued     1,250 mg 166.7 mL/hr over 90 Minutes Intravenous Every 12 hours 02/12/15 0500 02/12/15 0625   02/12/15 0115  piperacillin-tazobactam (ZOSYN) IVPB 3.375 g     3.375 g 100 mL/hr over 30 Minutes Intravenous  Once 02/12/15 0111 02/12/15 0137   02/12/15 0115  vancomycin (VANCOCIN) IVPB 1000 mg/200 mL premix     1,000 mg 200 mL/hr over 60 Minutes Intravenous  Once 02/12/15 0111 02/12/15 0208      Medications   Scheduled Meds: . amLODipine  10 mg Oral Daily  . antiseptic oral rinse  7 mL Mouth Rinse 10 times per day  . chlorhexidine gluconate  15 mL Mouth Rinse BID  . enoxaparin (LOVENOX) injection  40 mg Subcutaneous Q24H  . famotidine (PEPCID) IV  20 mg Intravenous Q12H  . feeding supplement (PRO-STAT SUGAR FREE 64)  30 mL Oral QID  . free water  100 mL Per Tube 3 times per day  . insulin aspart  2-6 Units Subcutaneous 6 times per day  . lisinopril  10 mg Oral Daily  . metoprolol  5 mg  Intravenous 4 times per day  . norepinephrine (LEVOPHED) Adult infusion  0-40 mcg/min Intravenous Once  . phosphorus  500 mg Oral 6 times per day  . piperacillin-tazobactam (ZOSYN)  IV  3.375 g Intravenous 3 times per day  . vancomycin  1,500 mg Intravenous Q12H   Continuous Infusions: . sodium chloride Stopped (02/13/15 0313)  . feeding supplement (VITAL 1.5 CAL) 1,000 mL (02/13/15 1413)  . propofol (DIPRIVAN) infusion 20 mcg/kg/min (02/13/15 1534)   PRN Meds:.acetaminophen, albuterol, enalaprilat, fentaNYL (SUBLIMAZE) injection, ondansetron (ZOFRAN) IV   Data Review:   Micro Results Recent Results (from the past 240 hour(s))  Blood Culture (routine x 2)     Status: None (Preliminary result)   Collection Time: 02/12/15  1:04 AM  Result Value Ref Range Status   Specimen Description BLOOD RIGHT HAND  Final   Special Requests   Final    BOTTLES DRAWN AEROBIC AND ANAEROBIC 15MLANEROBIC,10MLAEROBIC   Culture  Setup Time   Final    GRAM POSITIVE COCCI IN BOTH AEROBIC AND ANAEROBIC BOTTLES CRITICAL RESULT CALLED TO, READ BACK BY AND VERIFIED WITH: NATE COOKSON PHARMACIST AT 2203 02/12/15 SDR Organism ID to follow    Culture   Final    GRAM POSITIVE COCCI IN BOTH AEROBIC AND ANAEROBIC BOTTLES IDENTIFICATION TO FOLLOW    Report Status PENDING  Incomplete  Blood Culture (routine x 2)     Status: None (Preliminary result)   Collection Time: 02/12/15  1:04 AM  Result Value Ref Range Status   Specimen Description BLOOD LEFT HAND  Final   Special Requests   Final    BOTTLES DRAWN AEROBIC AND ANAEROBIC 10AEROBIC,16MLANAEROBIC   Culture NO GROWTH 1 DAY  Final   Report Status PENDING  Incomplete  Blood Culture ID Panel (Reflexed)     Status: Abnormal   Collection Time: 02/12/15  1:04 AM  Result Value Ref Range Status   Enterococcus species NOT DETECTED NOT DETECTED Final   Listeria monocytogenes NOT DETECTED NOT DETECTED Final   Staphylococcus species DETECTED (A) NOT DETECTED Final     Comment: CRITICAL RESULT CALLED TO, READ BACK BY AND VERIFIED WITH: NATE COOKSON AT 2203 02/12/15 SDR    Staphylococcus aureus NOT DETECTED NOT DETECTED Final   Streptococcus species NOT DETECTED NOT DETECTED Final   Streptococcus agalactiae NOT DETECTED NOT DETECTED Final   Streptococcus pneumoniae NOT DETECTED NOT DETECTED Final   Streptococcus pyogenes NOT DETECTED NOT DETECTED Final   Acinetobacter baumannii NOT DETECTED NOT DETECTED Final   Enterobacteriaceae species NOT DETECTED NOT DETECTED Final   Enterobacter cloacae complex NOT DETECTED NOT DETECTED Final   Escherichia coli NOT DETECTED NOT DETECTED Final   Klebsiella oxytoca NOT DETECTED NOT DETECTED Final   Klebsiella pneumoniae NOT DETECTED NOT DETECTED Final   Proteus species NOT DETECTED NOT DETECTED Final   Serratia marcescens NOT DETECTED NOT DETECTED Final   Haemophilus influenzae NOT DETECTED NOT DETECTED Final   Neisseria meningitidis NOT DETECTED NOT DETECTED Final   Pseudomonas aeruginosa NOT DETECTED NOT DETECTED Final   Candida albicans NOT DETECTED NOT DETECTED Final   Candida glabrata NOT DETECTED NOT DETECTED Final   Candida krusei NOT DETECTED NOT DETECTED Final   Candida parapsilosis NOT DETECTED NOT DETECTED Final   Candida tropicalis NOT DETECTED NOT DETECTED Final   Carbapenem resistance NOT DETECTED NOT DETECTED Final   Methicillin resistance DETECTED (A) NOT DETECTED Final    Comment: CRITICAL RESULT CALLED TO, READ BACK BY AND VERIFIED WITH: NATE COOKSON AT 2203 02/12/15 SDR    Vancomycin resistance NOT DETECTED NOT DETECTED Final  Urine culture     Status: None (Preliminary result)   Collection Time: 02/12/15  2:18 AM  Result Value Ref Range Status   Specimen Description URINE, RANDOM  Final   Special Requests NONE  Final   Culture HOLDING FOR POSSIBLE PATHOGEN  Final   Report Status PENDING  Incomplete  MRSA PCR Screening     Status: None   Collection Time: 02/12/15  5:18 AM  Result Value  Ref Range Status   MRSA by PCR NEGATIVE NEGATIVE Final    Comment:        The GeneXpert MRSA Assay (FDA approved for NASAL specimens only), is one component of a comprehensive MRSA colonization surveillance program. It is not intended to diagnose MRSA infection nor to guide or monitor treatment for MRSA infections.   Culture, respiratory (NON-Expectorated)     Status: None (Preliminary result)   Collection Time: 02/12/15  9:24 AM  Result Value Ref Range Status   Specimen Description TRACHEAL ASPIRATE  Final   Special Requests NONE  Final   Gram Stain PENDING  Incomplete   Culture Consistent with normal respiratory flora.  Final   Report Status PENDING  Incomplete  Culture, expectorated sputum-assessment     Status: None   Collection Time: 02/12/15  9:24 AM  Result Value Ref Range Status   Sputum evaluation DUPLICATE REQUEST CREDITED  Final   Report Status 02/13/2015 FINAL  Final    Radiology Reports Dg Chest 1 View  02/13/2015  CLINICAL DATA:  Shortness of breath. EXAM: CHEST 1 VIEW COMPARISON:  02/12/2015 . FINDINGS: Endotracheal tube, NG tube, right IJ line in stable position. Cardiomegaly with pulmonary vascular prominence  and bilateral interstitial prominence consistent mild congestive heart failure. No pleural effusion or pneumothorax. IMPRESSION: 1. Lines and tubes stable position. 2. Cardiomegaly with bilateral pulmonary interstitial prominence consistent with congestive heart failure with pulmonary interstitial edema. Findings have progressed from prior exam. Electronically Signed   By: Maisie Fus  Register   On: 02/13/2015 07:47   Dg Abd 1 View  02/12/2015  CLINICAL DATA:  54 year old male with enteric tube placement. Check for tube position. EXAM: ABDOMEN - 1 VIEW COMPARISON:  Abdominal CT dated 03/03/2004 FINDINGS: An enteric tube is partially visualized with tip in the upper abdomen and to the right of the superior endplate of the L1 vertebra. No bowel dilatation identified.  A 1.3 cm radiopaque density over the right renal silhouette may represent a kidney stone. There is mild haziness of the left lung base which may represent atelectatic changes. Pneumonia is not excluded. IMPRESSION: Enteric tube the tip in the upper abdomen.  No bowel dilatation. Probable right renal calculus. Electronically Signed   By: Elgie Collard M.D.   On: 02/12/2015 06:49   Dg Chest Portable 1 View  02/12/2015  CLINICAL DATA:  Central line placement.  Initial encounter. EXAM: PORTABLE CHEST 1 VIEW COMPARISON:  Chest radiograph performed earlier today at 1:14 a.m. FINDINGS: The patient's endotracheal tube is seen ending 3 cm above the carina. A right IJ line is noted ending about the mid SVC. A small left pleural effusion is noted, apparently new from the prior study. Bilateral central airspace opacities could reflect mild interstitial edema. No pneumothorax is seen. The cardiomediastinal silhouette is borderline normal in size. No acute osseous abnormalities are identified. IMPRESSION: 1. Endotracheal tube seen ending 3 cm above the carina. 2. Right IJ line noted ending about the mid SVC. 3. Small left pleural effusion, apparently new from the prior study. Bilateral central airspace opacification could reflect mild interstitial edema. Electronically Signed   By: Roanna Raider M.D.   On: 02/12/2015 02:34   Dg Chest Portable 1 View  02/12/2015  CLINICAL DATA:  55 year old male found unresponsive. Status post intubation. EXAM: PORTABLE CHEST 1 VIEW COMPARISON:  Radiograph dated 11/03/2014 FINDINGS: Endotracheal tube with tip approximately 4.5 cm above the carina. The lungs are clear. No pleural effusion or pneumothorax. The cardiac silhouette is within normal limits. The osseous structures appear unremarkable. IMPRESSION: Endotracheal tube above the carina. Electronically Signed   By: Elgie Collard M.D.   On: 02/12/2015 01:41     CBC  Recent Labs Lab 02/12/15 0104 02/13/15 0358  WBC 28.6*  15.1*  HGB 14.2 11.3*  HCT 45.3 35.5*  PLT 512* 243  MCV 80.6 79.6*  MCH 25.3* 25.4*  MCHC 31.3* 31.9*  RDW 16.4* 16.5*  LYMPHSABS 2.0  --   MONOABS 3.1*  --   EOSABS 0.0  --   BASOSABS 0.3*  --     Chemistries   Recent Labs Lab 02/12/15 0104 02/13/15 0358  NA 134* 135  K 4.2 4.1  CL 105 113*  CO2 19* 23  GLUCOSE 254* 171*  BUN 30* 23*  CREATININE 1.78* 0.98  CALCIUM 8.6* 7.7*  MG  --  1.7  AST 33  --   ALT 34  --   ALKPHOS 105  --   BILITOT 1.2  --    ------------------------------------------------------------------------------------------------------------------ estimated creatinine clearance is 102.6 mL/min (by C-G formula based on Cr of 0.98). ------------------------------------------------------------------------------------------------------------------ No results for input(s): HGBA1C in the last 72 hours. ------------------------------------------------------------------------------------------------------------------  Recent Labs  02/12/15 0424 02/12/15 4098  TRIG 105 NOTIFIED PAM CRAWFORD AT 1130 ON 02/11/15 BY VAB   ------------------------------------------------------------------------------------------------------------------ No results for input(s): TSH, T4TOTAL, T3FREE, THYROIDAB in the last 72 hours.  Invalid input(s): FREET3 ------------------------------------------------------------------------------------------------------------------ No results for input(s): VITAMINB12, FOLATE, FERRITIN, TIBC, IRON, RETICCTPCT in the last 72 hours.  Coagulation profile  Recent Labs Lab 02/12/15 0104  INR 1.30    No results for input(s): DDIMER in the last 72 hours.  Cardiac Enzymes  Recent Labs Lab 02/12/15 0104 02/12/15 0424  TROPONINI 0.20* 0.25*   ------------------------------------------------------------------------------------------------------------------ Invalid input(s): POCBNP    Assessment & Plan   Admitting diagnosis 1.  Acute respiratory failure: Likely related to aspiration pneumonia and septic shock, continue Ventilator support, weaning trials if tolerated 2. Severe sepsis due to urinary tract infection and possible decubitus ulcer continue broad-spectrum antibiotics with vancomycin and Zosyn, recent admission with Proteus in the urine. Blood culture showing gram-positive cocci await final identification 3. Hypotension blood pressure not elevated patient's blood pressure medications have been restarted 4. Aspiration pneumonia swallow eval once awake 5. Acute renal failure likely related to ATN and sepsis IV fluids monitor renal function 6. Abnormal troponin secondary to severe sepsis and renal failure appreciate cardiology input 7. Decubitus ulcer continue current supportive care wound care consult done 8. Urinary tract infection and sepsis, urine cultures showed no growth up-to-date 9. Multiple sclerosis 10. Sinus tachycardia started on Lopressor IV  Heart rate improved  Code Status History    Date Active Date Inactive Code Status Order ID Comments User Context   01/29/2015  9:46 AM 02/02/2015  6:20 PM Full Code [id number]  Ihor Austin, MD Inpatient   11/03/2014  8:04 AM 11/05/2014  2:36 PM Full Code [id number]  Arnaldo Natal, MD Inpatient   08/01/2014  3:18 PM 08/04/2014  6:26 PM Full Code [id number]  Katha Hamming, MD ED   06/15/2014  1:36 AM 06/18/2014  8:33 PM Full Code [id number]  Arnaldo Natal, MD ED           Consults  critical care, cardiology DVT Prophylaxis  Lovenox   Lab Results  Component Value Date   PLT 243 02/13/2015     Time Spent in minutes   35 minutes of critical care time Auburn Bilberry M.D on 02/13/2015 at 4:25 PM  Between 7am to 6pm - Pager - [phone]  After 6pm go to [email or web address] - password [email or web address] [facility]  [facility]   Office  [phone]

## 2015-02-13 NOTE — Progress Notes (Signed)
eLink Physician-Brief Progress Note Patient Name: Travis Cantrell DOB: 01/09/1961 MRN: [id number]   Date of Service  02/13/2015  HPI/Events of Note  Confusion about ventilator settings. Orders for Pressure Control. However, patient is currently on PRVC.   eICU Interventions  Will order: 1. Ventilator settings: 35%/PRVC 15/TV 500/P 5. Ppeak = 25.  2. Check ABG at 9:30 PM.      Intervention Category Major Interventions: Respiratory failure - evaluation and management  Sommer,Steven Eugene 02/13/2015, 8:28 PM

## 2015-02-13 NOTE — Progress Notes (Signed)
Mr. Zizzo remains on ventilator. VSS. NSR. Failed weaning trial this morning due to elevated CO2 levels. PT had an episode of Hypertension. PRN vasotec given with scheduled BP meds. BP stabilized now.

## 2015-02-13 NOTE — Progress Notes (Signed)
SUBJECTIVE: Patient remains intubated and sedated but hemodynamically looks stable.   Filed Vitals:   02/13/15 0400 02/13/15 0500 02/13/15 0700 02/13/15 0800  BP: 120/79 105/68 112/68 111/85  Pulse: 89 92 86 84  Temp: 99.3 F (37.4 C)  99.1 F (37.3 C)   TempSrc:   Other (Comment)   Resp: 17 17 16 15   Height:      Weight:  207 lb 7.3 oz (94.1 kg)    SpO2: 100% 100% 100% 100%    Intake/Output Summary (Last 24 hours) at 02/13/15 0848 Last data filed at 02/13/15 0500  Gross per 24 hour  Intake 1819.64 ml  Output   3425 ml  Net -1605.36 ml    LABS: Basic Metabolic Panel:  Recent Labs  78/93/81 0104 02/13/15 0358  NA 134* 135  K 4.2 4.1  CL 105 113*  CO2 19* 23  GLUCOSE 254* 171*  BUN 30* 23*  CREATININE 1.78* 0.98  CALCIUM 8.6* 7.7*  MG  --  1.7  PHOS  --  1.4*   Liver Function Tests:  Recent Labs  02/12/15 0104  AST 33  ALT 34  ALKPHOS 105  BILITOT 1.2  PROT 7.9  ALBUMIN 3.5    Recent Labs  02/12/15 0104  LIPASE 15   CBC:  Recent Labs  02/12/15 0104 02/13/15 0358  WBC 28.6* 15.1*  NEUTROABS 23.2*  --   HGB 14.2 11.3*  HCT 45.3 35.5*  MCV 80.6 79.6*  PLT 512* 243   Cardiac Enzymes:  Recent Labs  02/12/15 0104 02/12/15 0424  TROPONINI 0.20* 0.25*   BNP: Invalid input(s): POCBNP D-Dimer: No results for input(s): DDIMER in the last 72 hours. Hemoglobin A1C: No results for input(s): HGBA1C in the last 72 hours. Fasting Lipid Panel:  Recent Labs  02/12/15 0859  TRIG NOTIFIED PAM CRAWFORD AT 1130 ON 02/11/15 BY VAB   Thyroid Function Tests: No results for input(s): TSH, T4TOTAL, T3FREE, THYROIDAB in the last 72 hours.  Invalid input(s): FREET3 Anemia Panel: No results for input(s): VITAMINB12, FOLATE, FERRITIN, TIBC, IRON, RETICCTPCT in the last 72 hours.   PHYSICAL EXAM General: Well developed, well nourished, in no acute distress HEENT:  Normocephalic and atramatic Neck:  No JVD.  Lungs: Clear bilaterally to  auscultation and percussion. Heart: HRRR . Normal S1 and S2 without gallops or murmurs.  Abdomen: Bowel sounds are positive, abdomen soft and non-tender  Msk:  Back normal, normal gait. Normal strength and tone for age. Extremities: No clubbing, cyanosis or edema.   Neuro: Alert and oriented X 3. Psych:  Good affect, responds appropriately  TELEMETRY: Sinus rhythm  ASSESSMENT AND PLAN: Chest x-ray showed congestive congestive heart failure changes but has gram-positive cocci in the blood suggestive of possible urosepsis. We will locate the echocardiogram today and make further recommendation.  Principal Problem:   Respiratory failure (HCC)    Laurier Nancy, MD, [facility] 02/13/2015 8:48 AM

## 2015-02-14 ENCOUNTER — Inpatient Hospital Stay: Payer: Medicare Other

## 2015-02-14 LAB — CBC
HCT: 33.7 % — ABNORMAL LOW (ref 40.0–52.0)
Hemoglobin: 10.9 g/dL — ABNORMAL LOW (ref 13.0–18.0)
MCH: 26 pg (ref 26.0–34.0)
MCHC: 32.3 g/dL (ref 32.0–36.0)
MCV: 80.5 fL (ref 80.0–100.0)
Platelets: 242 10*3/uL (ref 150–440)
RBC: 4.19 MIL/uL — ABNORMAL LOW (ref 4.40–5.90)
RDW: 16.8 % — AB (ref 11.5–14.5)
WBC: 11.1 10*3/uL — AB (ref 3.8–10.6)

## 2015-02-14 LAB — BASIC METABOLIC PANEL
ANION GAP: 4 — AB (ref 5–15)
BUN: 18 mg/dL (ref 6–20)
CALCIUM: 7.9 mg/dL — AB (ref 8.9–10.3)
CO2: 26 mmol/L (ref 22–32)
Chloride: 110 mmol/L (ref 101–111)
Creatinine, Ser: 0.74 mg/dL (ref 0.61–1.24)
GFR calc Af Amer: >60 mL/min (ref >60)
GFR calc non Af Amer: >60 mL/min (ref >60)
GLUCOSE: 193 mg/dL — AB (ref 65–99)
Potassium: 3.7 mmol/L (ref 3.5–5.1)
Sodium: 140 mmol/L (ref 135–145)

## 2015-02-14 LAB — BLOOD GAS, ARTERIAL
ACID-BASE EXCESS: 0.3 mmol/L (ref 0.0–3.0)
Bicarbonate: 26 mEq/L (ref 21.0–28.0)
FIO2: 0.3
MECHVT: 500 mL
O2 Saturation: 94.2 %
PCO2 ART: 45 mmHg (ref 32.0–48.0)
PEEP: 5 cmH2O
PH ART: 7.37 (ref 7.350–7.450)
Patient temperature: 37
RATE: 15 resp/min
pO2, Arterial: 74 mmHg — ABNORMAL LOW (ref 83.0–108.0)

## 2015-02-14 LAB — CULTURE, RESPIRATORY W GRAM STAIN: Culture: NORMAL

## 2015-02-14 LAB — GLUCOSE, CAPILLARY
GLUCOSE-CAPILLARY: 106 mg/dL — AB (ref 65–99)
GLUCOSE-CAPILLARY: 145 mg/dL — AB (ref 65–99)
GLUCOSE-CAPILLARY: 177 mg/dL — AB (ref 65–99)
Glucose-Capillary: 219 mg/dL — ABNORMAL HIGH (ref 65–99)
Glucose-Capillary: 182 mg/dL — ABNORMAL HIGH (ref 65–99)
Glucose-Capillary: 108 mg/dL — ABNORMAL HIGH (ref 65–99)
Glucose-Capillary: 108 mg/dL — ABNORMAL HIGH (ref 65–99)

## 2015-02-14 LAB — CULTURE, RESPIRATORY

## 2015-02-14 LAB — HEMOGLOBIN A1C: HEMOGLOBIN A1C: 5.6 % (ref 4.0–6.0)

## 2015-02-14 MED ORDER — HYDRALAZINE HCL 20 MG/ML IJ SOLN: 10.0000 mg | INTRAMUSCULAR | Status: DC | PRN

## 2015-02-14 MED ORDER — KCL-LACTATED RINGERS-D5W 20 MEQ/L IV SOLN
INTRAVENOUS | Status: DC
  Administered 2015-02-14 – 2015-02-17 (×5): via INTRAVENOUS
  Filled 2015-02-14 (×7): qty 1000

## 2015-02-14 MED ORDER — INSULIN ASPART 100 UNIT/ML ~~LOC~~ SOLN
0.0000 [IU] | SUBCUTANEOUS | Status: DC
  Administered 2015-02-14: 2 [IU] via SUBCUTANEOUS
  Filled 2015-02-14: qty 2

## 2015-02-14 MED ORDER — INSULIN ASPART 100 UNIT/ML ~~LOC~~ SOLN
0.0000 [IU] | Freq: Three times a day (TID) | SUBCUTANEOUS | Status: DC
  Administered 2015-02-14: 2 [IU] via SUBCUTANEOUS
  Filled 2015-02-14: qty 2

## 2015-02-14 MED ORDER — SODIUM CHLORIDE 0.9 % IV SOLN
INTRAVENOUS | Status: DC
  Administered 2015-02-14: 08:00:00 via INTRAVENOUS

## 2015-02-14 MED ORDER — METOPROLOL TARTRATE 25 MG PO TABS
25.0000 mg | ORAL_TABLET | Freq: Two times a day (BID) | ORAL | Status: DC
  Administered 2015-02-14: 25 mg via ORAL
  Filled 2015-02-14: qty 1

## 2015-02-14 MED ORDER — CETYLPYRIDINIUM CHLORIDE 0.05 % MT LIQD
7.0000 mL | Freq: Two times a day (BID) | OROMUCOSAL | Status: DC
  Administered 2015-02-14 – 2015-02-15 (×2): 7 mL via OROMUCOSAL

## 2015-02-14 MED ORDER — FENTANYL CITRATE (PF) 100 MCG/2ML IJ SOLN: 12.5000 ug | INTRAMUSCULAR | Status: DC | PRN

## 2015-02-14 NOTE — Progress Notes (Signed)
Initial Nutrition Assessment   INTERVENTION:   Coordination of Care: will await diet progression as medically able s/p extubation Medical Food Supplement Therapy: on previous admissions pt has consumed Ensure Enlive well. Will recommend once safely on a diet for added protein and kcals.   NUTRITION DIAGNOSIS:   Inadequate oral intake related to acute illness as evidenced by NPO status.  GOAL:   Patient will meet greater than or equal to 90% of their needs  MONITOR:    (Energy Intake, Anthropometrics, Digestive System, Electrolyte/Renal Profile, Glucose Profile)   ASSESSMENT:    Per RT this am, pt extubated to 3L [place] with no adverse reactions.  Diet Order:  Diet NPO time specified    Current Nutrition: Pt now NPO. Previously tolerating Vital 1.5 at 48mL/h with Prostat QID and diprivan providing 235kcals in last 24 hours   Gastrointestinal Profile: Last BM: 02/13/2015   Scheduled Medications:  . antiseptic oral rinse  7 mL Mouth Rinse 10 times per day  . chlorhexidine gluconate  15 mL Mouth Rinse BID  . enoxaparin (LOVENOX) injection  40 mg Subcutaneous Q24H  . famotidine (PEPCID) IV  20 mg Intravenous Q12H  . insulin aspart  0-15 Units Subcutaneous 6 times per day  . metoprolol  5 mg Intravenous 4 times per day  . piperacillin-tazobactam (ZOSYN)  IV  3.375 g Intravenous 3 times per day    Continuous Medications:  . dextrose 5% lactated ringers with KCl 20 mEq/L 50 mL/hr at 02/14/15 1211   D5 to provide 204kcals in 24 hours   Electrolyte/Renal Profile and Glucose Profile:   Recent Labs Lab 02/12/15 0104 02/13/15 0358 02/14/15 0444  NA 134* 135 140  K 4.2 4.1 3.7  CL 105 113* 110  CO2 19* 23 26  BUN 30* 23* 18  CREATININE 1.78* 0.98 0.74  CALCIUM 8.6* 7.7* 7.9*  MG  --  1.7  --   PHOS  --  1.4*  --   GLUCOSE 254* 171* 193*   Protein Profile:   Recent Labs Lab 02/12/15 0104  ALBUMIN 3.5     Weight Trend since Admission: Filed Weights   02/12/15 0600 02/13/15 0500 02/14/15 0400  Weight: 196 lb 6.9 oz (89.1 kg) 207 lb 7.3 oz (94.1 kg) 209 lb 7 oz (95 kg)     BMI:  Body mass index is 28.4 kg/(m^2).  Estimated Nutritional Needs:   Kcal:  BEE: 1823kcals, TEE: (IF 1.0-1.2)(AF 1.2) [id number]  Protein:  104-123g protein (1.1-1.3g/kg)  Fluid:  2225-2670 mL (25-30 ml/kg)    HIGH Care Level  Leda Quail, RD, LDN Pager [phone] Weekend/On-Call Pager [phone]

## 2015-02-14 NOTE — Progress Notes (Signed)
Placed on high fowlers position, cuff deflated, suctioned orally and endotracheally and then extubated to 3 lpm o2 [place]. No adverse reaction noted

## 2015-02-14 NOTE — Progress Notes (Addendum)
[facility] [place] [facility]   PATIENT NAME: Vasilios Ottaway    MR#:  [id number]  DATE OF BIRTH:  02/24/1960  SUBJECTIVE:  remains intubated and on the vent  REVIEW OF SYSTEMS:   Review of Systems  Unable to perform ROS: intubated   Tolerating Diet:TF Tolerating PT: paraplegic  DRUG ALLERGIES:   Allergies  Allergen Reactions  . Baclofen Diarrhea    VITALS:  Blood pressure 133/92, pulse 78, temperature 99 F (37.2 C), temperature source Other (Comment), resp. rate 15, height 6' (1.829 m), weight 95 kg (209 lb 7 oz), SpO2 99 %.  PHYSICAL EXAMINATION:   Physical Exam  GENERAL:  55 y.o.-year-old patient lying in the bed with no acute distress. Critically ill on the vent EYES: Pupils equal, round, reactive to light and accommodation. No scleral icterus. Extraocular muscles intact.  HEENT: Head atraumatic, normocephalic. Oropharynx and nasopharynx clear.  NECK:  Supple, no jugular venous distention. No thyroid enlargement, no tenderness.  LUNGS: decreased breath sounds bilaterally, no wheezing, rales, rhonchi. No use of accessory muscles of respiration.  CARDIOVASCULAR: S1, S2 normal. No murmurs, rubs, or gallops.  ABDOMEN: Soft, nontender, nondistended. Bowel sounds present. No organomegaly or mass.  EXTREMITIES: No cyanosis, clubbing or edema b/l.    NEUROLOGIC: intubated and on the vent.   PSYCHIATRIC: sedated, on vent SKIN: No obvious rash, lesion Chronic decubitus ulcer.   LABORATORY PANEL:  CBC  Recent Labs Lab 02/14/15 0444  WBC 11.1*  HGB 10.9*  HCT 33.7*  PLT 242    Chemistries   Recent Labs Lab 02/12/15 0104 02/13/15 0358 02/14/15 0444  NA 134* 135 140  K 4.2 4.1 3.7  CL 105 113* 110  CO2 19* 23 26  GLUCOSE 254* 171* 193*  BUN 30* 23* 18  CREATININE 1.78* 0.98 0.74  CALCIUM 8.6* 7.7* 7.9*  MG  --  1.7  --   AST 33  --   --   ALT 34  --   --   ALKPHOS 105  --   --   BILITOT 1.2  --   --     Cardiac  Enzymes  Recent Labs Lab 02/12/15 0424  TROPONINI 0.25*   RADIOLOGY:  Dg Chest 1 View  02/13/2015  CLINICAL DATA:  Shortness of breath. EXAM: CHEST 1 VIEW COMPARISON:  02/12/2015 . FINDINGS: Endotracheal tube, NG tube, right IJ line in stable position. Cardiomegaly with pulmonary vascular prominence and bilateral interstitial prominence consistent mild congestive heart failure. No pleural effusion or pneumothorax. IMPRESSION: 1. Lines and tubes stable position. 2. Cardiomegaly with bilateral pulmonary interstitial prominence consistent with congestive heart failure with pulmonary interstitial edema. Findings have progressed from prior exam. Electronically Signed   By: Maisie Fus  Register   On: 02/13/2015 07:47   ASSESSMENT AND PLAN:  Cesare Sumlin is a 55 y.o. male with a known history of multiple sclerosis, hypertension, decubitus ulcer, Proteus mirabilis urinary tract infection presented to the emergency room with lethargy, confusion and difficulty breathing. Patient lives with roommates at home.   1. Acute hypoxic respiratory failure: due to septic shock -Ventilator support, weaning trials fialed y'day -CXR shows Cardiomegaly with pulmonary cvascular congestion ECho EF 75% -decreased IVF -?lasix -Gas this am shows some improvement  2. Severe sepsis due to urinary tract infection and possible decubitus ulcer  -continue vancomycin and Zosyn, recent admission with Proteus in the urine.  -Blood culture 02/12/15 (PCR) showing STAPH -BC 02/12/2015 (routine) 2/2 no growth -UC pending -WC in the  past grew MRSA  3. Malignant HTN resume BP meds -IV prn metoprolol -cont lisinopril,amlodipine and add metoprolol 25 mg bid  4. Acute renal failure likely related to ATN and sepsis  -IV fluids -creat 1.78--->0.74 Decrease IVF  5. Abnormal troponin secondary to severe sepsis and renal failure - appreciate cardiology input  6.Chronic  Decubitus ulcer continue current supportive care wound care  consult done  7. Recurrent Urinary tract infection due to indwelling Foley catheter  UC pending -pt on zosyn -recently completed course of keflex  8. 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 Hyperglycemia no h/o DM -?tube feeds -on SSI Check A1c  CODE STATUS:full  DVT Prophylaxis: lovenox  TOTAL critical  TIME TAKING CARE OF THIS PATIENT:35 minutes.  >50% time spent on counselling and coordination of care   Note: This dictation was prepared with Dragon dictation along with smaller phrase technology. Any transcriptional errors that result from this process are unintentional.  Annai Heick M.D on 02/14/2015 at 7:24 AM  Between 7am to 6pm - Pager - [phone]  After 6pm go to [email or web address] - password [email or web address] [facility]  [place] [facility]  Office  [phone]  CC: Primary care physician; Kerby Nora, MD

## 2015-02-14 NOTE — Progress Notes (Signed)
SUBJECTIVE: Remains intubated   Filed Vitals:   02/14/15 0400 02/14/15 0500 02/14/15 0600 02/14/15 0800  BP: 156/98 166/99 133/92   Pulse: 93 96 78   Temp:  99 F (37.2 C)  98.5 F (36.9 C)  TempSrc:    Rectal  Resp: Height:      Weight: 209 lb 7 oz (95 kg)     SpO2: 99% 98% 99%     Intake/Output Summary (Last 24 hours) at 02/14/15 1133 Last data filed at 02/14/15 0526  Gross per 24 hour  Intake 449.37 ml  Output   2950 ml  Net -2500.63 ml    LABS: Basic Metabolic Panel:  Recent Labs  16/10/96 0358 02/14/15 0444  NA 135 140  K 4.1 3.7  CL 113* 110  CO2 23 26  GLUCOSE 171* 193*  BUN 23* 18  CREATININE 0.98 0.74  CALCIUM 7.7* 7.9*  MG 1.7  --   PHOS 1.4*  --    Liver Function Tests:  Recent Labs  02/12/15 0104  AST 33  ALT 34  ALKPHOS 105  BILITOT 1.2  PROT 7.9  ALBUMIN 3.5    Recent Labs  02/12/15 0104  LIPASE 15   CBC:  Recent Labs  02/12/15 0104 02/13/15 0358 02/14/15 0444  WBC 28.6* 15.1* 11.1*  NEUTROABS 23.2*  --   --   HGB 14.2 11.3* 10.9*  HCT 45.3 35.5* 33.7*  MCV 80.6 79.6* 80.5  PLT 512* 243 242   Cardiac Enzymes:  Recent Labs  02/12/15 0104 02/12/15 0424  TROPONINI 0.20* 0.25*   BNP: Invalid input(s): POCBNP D-Dimer: No results for input(s): DDIMER in the last 72 hours. Hemoglobin A1C: No results for input(s): HGBA1C in the last 72 hours. Fasting Lipid Panel:  Recent Labs  02/12/15 0859  TRIG NOTIFIED PAM CRAWFORD AT 1130 ON 02/11/15 BY VAB   Thyroid Function Tests: No results for input(s): TSH, T4TOTAL, T3FREE, THYROIDAB in the last 72 hours.  Invalid input(s): FREET3 Anemia Panel: No results for input(s): VITAMINB12, FOLATE, FERRITIN, TIBC, IRON, RETICCTPCT in the last 72 hours.   PHYSICAL EXAM General: Well developed, well nourished, in no acute distress HEENT:  Normocephalic and atramatic Neck:  No JVD.  Lungs: Clear bilaterally to auscultation and percussion. Heart: HRRR . Normal S1  and S2 without gallops or murmurs.  Abdomen: Bowel sounds are positive, abdomen soft and non-tender  Msk:  Back normal, normal gait. Normal strength and tone for age. Extremities: No clubbing, cyanosis or edema.   Neuro: Alert and oriented X 3. Psych:  Good affect, responds appropriately  TELEMETRY: NSR  ASSESSMENT AND PLAN:  Laurier Nancy, MD Physician Signed Cardiology Progress Notes 02/13/2015 8:47 AM    Expand All Collapse All   SUBJECTIVE: Patient remains intubated and sedated but hemodynamically looks stable.   Filed Vitals:   02/13/15 0400 02/13/15 0500 02/13/15 0700 02/13/15 0800  BP: 120/79 105/68 112/68 111/85  Pulse: 89 92 86 84  Temp: 99.3 F (37.4 C)  99.1 F (37.3 C)   TempSrc:   Other (Comment)   Resp: Height:      Weight:  207 lb 7.3 oz (94.1 kg)    SpO2: 100% 100% 100% 100%    Intake/Output Summary (Last 24 hours) at 02/13/15 0848 Last data filed at 02/13/15 0500  Gross per 24 hour  Intake 1819.64 ml  Output  3425 ml  Net -1605.36 ml    LABS: Basic  Metabolic Panel:  Recent Labs (last 2 labs)      Recent Labs  02/12/15 0104 02/13/15 0358  NA 134* 135  K 4.2 4.1  CL 105 113*  CO2 19* 23  GLUCOSE 254* 171*  BUN 30* 23*  CREATININE 1.78* 0.98  CALCIUM 8.6* 7.7*  MG --  1.7  PHOS --  1.4*     Liver Function Tests:  Recent Labs (last 2 labs)      Recent Labs  02/12/15 0104  AST 33  ALT 34  ALKPHOS 105  BILITOT 1.2  PROT 7.9  ALBUMIN 3.5      Recent Labs (last 2 labs)      Recent Labs  02/12/15 0104  LIPASE 15     CBC:  Recent Labs (last 2 labs)      Recent Labs  02/12/15 0104 02/13/15 0358  WBC 28.6* 15.1*  NEUTROABS 23.2* --   HGB 14.2 11.3*  HCT 45.3 35.5*  MCV 80.6 79.6*  PLT 512* 243     Cardiac Enzymes:  Recent Labs (last 2 labs)      Recent Labs   02/12/15 0104 02/12/15 0424  TROPONINI 0.20* 0.25*     BNP:  Recent Labs (last 2 labs)     Invalid input(s): POCBNP   D-Dimer:  Recent Labs (last 2 labs)     No results for input(s): DDIMER in the last 72 hours.   Hemoglobin A1C:  Recent Labs (last 2 labs)     No results for input(s): HGBA1C in the last 72 hours.   Fasting Lipid Panel:  Recent Labs (last 2 labs)      Recent Labs  02/12/15 0859  TRIG NOTIFIED PAM CRAWFORD AT 1130 ON 02/11/15 BY VAB     Thyroid Function Tests:  Recent Labs (last 2 labs)     No results for input(s): TSH, T4TOTAL, T3FREE, THYROIDAB in the last 72 hours.  Invalid input(s): FREET3   Anemia Panel:  Recent Labs (last 2 labs)     No results for input(s): VITAMINB12, FOLATE, FERRITIN, TIBC, IRON, RETICCTPCT in the last 72 hours.     PHYSICAL EXAM General: Well developed, well nourished, in no acute distress HEENT: Normocephalic and atramatic Neck: No JVD.  Lungs: Clear bilaterally to auscultation and percussion. Heart: HRRR . Normal S1 and S2 without gallops or murmurs.  Abdomen: Bowel sounds are positive, abdomen soft and non-tender  Msk: Back normal, normal gait. Normal strength and tone for age. Extremities: No clubbing, cyanosis or edema.  Neuro: Alert and oriented X 3. Psych: Good affect, responds appropriately  TELEMETRY: Sinus rhythm  ASSESSMENT AND PLAN: Chest x-ray had congestive changes suggestive of CHF. However has urosepsis with gram-positive cocci on blood cultures. Echocardiogram showed normal left reticular systolic function and thus respiratory failure is due to sepsis and pneumonia.  Principal Problem:  Respiratory failure (HCC)    Laurier Nancy, MD, [facility] 02/13/2015 8:48 AM                   Principal Problem:   Respiratory failure (HCC)    Viha Kriegel A, MD, [facility] 02/14/2015 11:33 AM

## 2015-02-14 NOTE — Progress Notes (Addendum)
ANTIBIOTIC CONSULT NOTE - Follow Up  Pharmacy Consult for Zosyn/vancomycin Indication: rule out sepsis  Allergies  Allergen Reactions  . Baclofen Diarrhea    Patient Measurements: Height: 6' (182.9 cm) Weight: 209 lb 7 oz (95 kg) IBW/kg (Calculated) : 77.6 Adjusted Body Weight:   Vital Signs: Temp: 99.3 F (37.4 C) (01/21 1200) Temp Source: Rectal (01/21 1200) BP: 136/113 mmHg (01/21 1400) Pulse Rate: 87 (01/21 1400) Intake/Output from previous day: 01/20 0701 - 01/21 0700 In: 456 [I.V.:221; NG/GT:235] Out: 3350 [Urine:3350] Intake/Output from this shift: Total I/O In: 410 [NG/GT:60; IV Piggyback:350] Out: 450 [Urine:450]  Labs:  Recent Labs  02/12/15 0104 02/13/15 0358 02/14/15 0444  WBC 28.6* 15.1* 11.1*  HGB 14.2 11.3* 10.9*  PLT 512* 243 242  CREATININE 1.78* 0.98 0.74   Estimated Creatinine Clearance: 126.3 mL/min (by C-G formula based on Cr of 0.74). No results for input(s): VANCOTROUGH, VANCOPEAK, VANCORANDOM, GENTTROUGH, GENTPEAK, GENTRANDOM, TOBRATROUGH, TOBRAPEAK, TOBRARND, AMIKACINPEAK, AMIKACINTROU, AMIKACIN in the last 72 hours.   Microbiology: Recent Results (from the past 720 hour(s))  Blood culture (routine x 2)     Status: None   Collection Time: 01/29/15  5:30 AM  Result Value Ref Range Status   Specimen Description BLOOD LEFT AC  Final   Special Requests   Final    BOTTLES DRAWN AEROBIC AND ANAEROBIC ANAEROBIC AEROBIC   Culture NO GROWTH 5 DAYS  Final   Report Status 02/03/2015 FINAL  Final  Blood culture (routine x 2)     Status: None   Collection Time: 01/29/15  5:40 AM  Result Value Ref Range Status   Specimen Description BLOOD RIGHT AC  Final   Special Requests   Final    BOTTLES DRAWN AEROBIC AND ANAEROBIC ANAEROBIC AEROBIC   Culture NO GROWTH 5 DAYS  Final   Report Status 02/03/2015 FINAL  Final  Urine culture     Status: None   Collection Time: 01/29/15  5:43 AM  Result Value Ref Range Status   Specimen  Description URINE, CATHETERIZED  Final   Special Requests NONE  Final   Culture >=100,000 COLONIES/mL PROTEUS MIRABILIS  Final   Report Status 01/31/2015 FINAL  Final   Organism ID, Bacteria PROTEUS MIRABILIS  Final      Susceptibility   Proteus mirabilis - MIC*    AMPICILLIN 16 INTERMEDIATE Intermediate     CEFTAZIDIME <=1 SENSITIVE Sensitive     CEFAZOLIN 8 SENSITIVE Sensitive     CEFTRIAXONE <=1 SENSITIVE Sensitive     CIPROFLOXACIN >=4 RESISTANT Resistant     GENTAMICIN <=1 SENSITIVE Sensitive     IMIPENEM >=16 RESISTANT Resistant     TRIMETH/SULFA 160 RESISTANT Resistant     PIP/TAZO Value in next row Sensitive      SENSITIVE<=4    * >=100,000 COLONIES/mL PROTEUS MIRABILIS  MRSA PCR Screening     Status: None   Collection Time: 01/29/15 12:50 PM  Result Value Ref Range Status   MRSA by PCR NEGATIVE NEGATIVE Final    Comment:        The GeneXpert MRSA Assay (FDA approved for NASAL specimens only), is one component of a comprehensive MRSA colonization surveillance program. It is not intended to diagnose MRSA infection nor to guide or monitor treatment for MRSA infections.   Blood Culture (routine x 2)     Status: None (Preliminary result)   Collection Time: 02/12/15  1:04 AM  Result Value Ref Range Status   Specimen Description BLOOD  RIGHT HAND  Final   Special Requests   Final    BOTTLES DRAWN AEROBIC AND ANAEROBIC 15MLANEROBIC,10MLAEROBIC   Culture  Setup Time   Final    GRAM POSITIVE COCCI IN BOTH AEROBIC AND ANAEROBIC BOTTLES CRITICAL RESULT CALLED TO, READ BACK BY AND VERIFIED WITH: NATE COOKSON PHARMACIST AT 2203 02/12/15 SDR Organism ID to follow    Culture   Final    COAGULASE NEGATIVE STAPHYLOCOCCUS IN BOTH AEROBIC AND ANAEROBIC BOTTLES IDENTIFICATION TO FOLLOW    Report Status PENDING  Incomplete  Blood Culture (routine x 2)     Status: None (Preliminary result)   Collection Time: 02/12/15  1:04 AM  Result Value Ref Range Status   Specimen Description  BLOOD LEFT HAND  Final   Special Requests   Final    BOTTLES DRAWN AEROBIC AND ANAEROBIC 10AEROBIC,16MLANAEROBIC   Culture NO GROWTH 2 DAYS  Final   Report Status PENDING  Incomplete  Blood Culture ID Panel (Reflexed)     Status: Abnormal   Collection Time: 02/12/15  1:04 AM  Result Value Ref Range Status   Enterococcus species NOT DETECTED NOT DETECTED Final   Listeria monocytogenes NOT DETECTED NOT DETECTED Final   Staphylococcus species DETECTED (A) NOT DETECTED Final    Comment: CRITICAL RESULT CALLED TO, READ BACK BY AND VERIFIED WITH: NATE COOKSON AT 2203 02/12/15 SDR    Staphylococcus aureus NOT DETECTED NOT DETECTED Final   Streptococcus species NOT DETECTED NOT DETECTED Final   Streptococcus agalactiae NOT DETECTED NOT DETECTED Final   Streptococcus pneumoniae NOT DETECTED NOT DETECTED Final   Streptococcus pyogenes NOT DETECTED NOT DETECTED Final   Acinetobacter baumannii NOT DETECTED NOT DETECTED Final   Enterobacteriaceae species NOT DETECTED NOT DETECTED Final   Enterobacter cloacae complex NOT DETECTED NOT DETECTED Final   Escherichia coli NOT DETECTED NOT DETECTED Final   Klebsiella oxytoca NOT DETECTED NOT DETECTED Final   Klebsiella pneumoniae NOT DETECTED NOT DETECTED Final   Proteus species NOT DETECTED NOT DETECTED Final   Serratia marcescens NOT DETECTED NOT DETECTED Final   Haemophilus influenzae NOT DETECTED NOT DETECTED Final   Neisseria meningitidis NOT DETECTED NOT DETECTED Final   Pseudomonas aeruginosa NOT DETECTED NOT DETECTED Final   Candida albicans NOT DETECTED NOT DETECTED Final   Candida glabrata NOT DETECTED NOT DETECTED Final   Candida krusei NOT DETECTED NOT DETECTED Final   Candida parapsilosis NOT DETECTED NOT DETECTED Final   Candida tropicalis NOT DETECTED NOT DETECTED Final   Carbapenem resistance NOT DETECTED NOT DETECTED Final   Methicillin resistance DETECTED (A) NOT DETECTED Final    Comment: CRITICAL RESULT CALLED TO, READ BACK BY AND  VERIFIED WITH: NATE COOKSON AT 2203 02/12/15 SDR    Vancomycin resistance NOT DETECTED NOT DETECTED Final  Urine culture     Status: None (Preliminary result)   Collection Time: 02/12/15  2:18 AM  Result Value Ref Range Status   Specimen Description URINE, RANDOM  Final   Special Requests NONE  Final   Culture   Final    >=100,000 COLONIES/mL GRAM NEGATIVE RODS IDENTIFICATION AND SUSCEPTIBILITIES TO FOLLOW    Report Status PENDING  Incomplete  MRSA PCR Screening     Status: None   Collection Time: 02/12/15  5:18 AM  Result Value Ref Range Status   MRSA by PCR NEGATIVE NEGATIVE Final    Comment:        The GeneXpert MRSA Assay (FDA approved for NASAL specimens only), is one component of a  comprehensive MRSA colonization surveillance program. It is not intended to diagnose MRSA infection nor to guide or monitor treatment for MRSA infections.   Culture, respiratory (NON-Expectorated)     Status: None   Collection Time: 02/12/15  9:24 AM  Result Value Ref Range Status   Specimen Description TRACHEAL ASPIRATE  Final   Special Requests NONE  Final   Gram Stain   Final    MODERATE WBC SEEN RARE GRAM POSITIVE COCCI GOOD SPECIMEN - 80-90% WBCS    Culture Consistent with normal respiratory flora.  Final   Report Status 02/14/2015 FINAL  Final  Culture, expectorated sputum-assessment     Status: None   Collection Time: 02/12/15  9:24 AM  Result Value Ref Range Status   Sputum evaluation DUPLICATE REQUEST CREDITED  Final   Report Status 02/13/2015 FINAL  Final    Medical History: Past Medical History  Diagnosis Date  . Allergy   . GERD (gastroesophageal reflux disease)   . MS (multiple sclerosis) (HCC)   . Hypertension   . Decubitus ulcer     Medications:  Infusions:  . dextrose 5% lactated ringers with KCl 20 mEq/L 50 mL/hr at 02/14/15 1211   Assessment: 54 yom found unresponsive, recently released (Tuesday) for UTI. Pharmacy consulted to dose vancomycin/Zosyn for  sepsis. Vancomycin d/c then resumed due to BCID with Staph species with mecA detected in 1/2 blood cultures.   Goal of Therapy:  Vancomycin trough level 15-20 mcg/ml  Plan:  1/21 Vancomycin discontinued by MD.    Will continue Zosyn 3.375 g EI q 8 hours.  Will continue to monitor renal function and culture results.   Stormy Card, RPh Clinical Pharmacist 02/14/2015,2:53 PM

## 2015-02-15 DIAGNOSIS — J9600 Acute respiratory failure, unspecified whether with hypoxia or hypercapnia: Secondary | ICD-10-CM

## 2015-02-15 DIAGNOSIS — N390 Urinary tract infection, site not specified: Secondary | ICD-10-CM

## 2015-02-15 DIAGNOSIS — R6520 Severe sepsis without septic shock: Secondary | ICD-10-CM

## 2015-02-15 LAB — URINE CULTURE

## 2015-02-15 LAB — CULTURE, BLOOD (ROUTINE X 2)

## 2015-02-15 LAB — CALCIUM, IONIZED: Calcium, Ionized, Serum: 4.6 mg/dL (ref 4.5–5.6)

## 2015-02-15 LAB — GLUCOSE, CAPILLARY
GLUCOSE-CAPILLARY: 118 mg/dL — AB (ref 65–99)
GLUCOSE-CAPILLARY: 122 mg/dL — AB (ref 65–99)
GLUCOSE-CAPILLARY: 94 mg/dL (ref 65–99)
Glucose-Capillary: 107 mg/dL — ABNORMAL HIGH (ref 65–99)

## 2015-02-15 MED ORDER — PANTOPRAZOLE SODIUM 40 MG IV SOLR
40.0000 mg | INTRAVENOUS | Status: DC
  Administered 2015-02-15 – 2015-02-17 (×3): 40 mg via INTRAVENOUS
  Filled 2015-02-15 (×3): qty 40

## 2015-02-15 MED ORDER — CHLORHEXIDINE GLUCONATE 0.12 % MT SOLN
15.0000 mL | Freq: Two times a day (BID) | OROMUCOSAL | Status: DC
  Administered 2015-02-16 – 2015-02-18 (×5): 15 mL via OROMUCOSAL
  Filled 2015-02-15 (×5): qty 15

## 2015-02-15 MED ORDER — CETYLPYRIDINIUM CHLORIDE 0.05 % MT LIQD
7.0000 mL | Freq: Two times a day (BID) | OROMUCOSAL | Status: DC
  Administered 2015-02-16 – 2015-02-18 (×4): 7 mL via OROMUCOSAL

## 2015-02-15 NOTE — Progress Notes (Signed)
[facility] - [place] at [facility]   PATIENT NAME: Travis Cantrell    MR#:  [id number]  DATE OF BIRTH:  05-17-1960  SUBJECTIVE:  S/p extubation. Trying to talk. Mild confusion. Vitals stable. No fever. Coughing up yellow phlegm per RN "a lot"  REVIEW OF SYSTEMS:   Review of Systems  Constitutional: Negative for fever, chills and weight loss.  HENT: Positive for congestion. Negative for ear discharge, ear pain and nosebleeds.   Eyes: Negative for blurred vision and pain.  Respiratory: Positive for cough and sputum production. Negative for shortness of breath, wheezing and stridor.   Cardiovascular: Negative for chest pain, palpitations, orthopnea and PND.  Gastrointestinal: Negative for nausea, abdominal pain and diarrhea.  Genitourinary: Negative for urgency and frequency.  Musculoskeletal: Negative for back pain and joint pain.  Neurological: Positive for weakness. Negative for sensory change, speech change and focal weakness.  Psychiatric/Behavioral: Negative for depression and hallucinations. The patient is not nervous/anxious.   All other systems reviewed and are negative.  Tolerating Diet:TF Tolerating PT: paraplegic  DRUG ALLERGIES:   Allergies  Allergen Reactions  . Baclofen Diarrhea    VITALS:  Blood pressure 159/95, pulse 92, temperature 100 F (37.8 C), temperature source Other (Comment), resp. rate 21, height 6' (1.829 m), weight 88.2 kg (194 lb 7.1 oz), SpO2 98 %.  PHYSICAL EXAMINATION:   Physical Exam  GENERAL:  55 y.o.-year-old patient lying in the bed with no acute distress. Critically ill, exxtubated EYES: Pupils equal, round, reactive to light and accommodation. No scleral icterus. Extraocular muscles intact.  HEENT: Head atraumatic, normocephalic. Oropharynx and nasopharynx clear.  NECK:  Supple, no jugular venous distention. No thyroid enlargement, no tenderness.  LUNGS: decreased breath sounds bilaterally, no wheezing, rales,  rhonchi. No use of accessory muscles of respiration.  CARDIOVASCULAR: S1, S2 normal. No murmurs, rubs, or gallops.  ABDOMEN: Soft, nontender, nondistended. Bowel sounds present. No organomegaly or mass.  EXTREMITIES: No cyanosis, clubbing or edema b/l.   Heel protectors+, great toe nails severe onychomycosis, muscle wasting lower extremity NEUROLOGIC:chornic functional paraplegia  PSYCHIATRIC: Alert and mild confusion  SKIN: No obvious rash, lesion Chronic decubitus ulcer.   LABORATORY PANEL:  CBC  Recent Labs Lab 02/14/15 0444  WBC 11.1*  HGB 10.9*  HCT 33.7*  PLT 242    Chemistries   Recent Labs Lab 02/12/15 0104 02/13/15 0358 02/14/15 0444  NA 134* 135 140  K 4.2 4.1 3.7  CL 105 113* 110  CO2 19* 23 26  GLUCOSE 254* 171* 193*  BUN 30* 23* 18  CREATININE 1.78* 0.98 0.74  CALCIUM 8.6* 7.7* 7.9*  MG  --  1.7  --   AST 33  --   --   ALT 34  --   --   ALKPHOS 105  --   --   BILITOT 1.2  --   --     Cardiac Enzymes  Recent Labs Lab 02/12/15 0424  TROPONINI 0.25*   RADIOLOGY:  Dg Chest 1 View  02/14/2015  CLINICAL DATA:  CC you patient, dyspnea. EXAM: CHEST 1 VIEW COMPARISON:  Chest x-ray dated 02/13/2015 and 02/12/2015. FINDINGS: Mild cardiomegaly is stable. Overall cardiomediastinal silhouette is stable in size and configuration. Again noted is central pulmonary vascular congestion and mild bilateral interstitial edema. No new lung findings. No large pleural effusion. No pneumothorax seen. Endotracheal tube remains well positioned with tip approximately 2 cm above the carina. Right IJ central line adequately positioned with tip overlying the upper  SVC. Enteric tube passes below the diaphragm. IMPRESSION: 1. Mild cardiomegaly with central pulmonary vascular congestion and mild bilateral interstitial edema compatible with continued volume overload/CHF. 2. No new lung findings. 3. Tubes and lines remain adequately positioned. Electronically Signed   By: Bary Richard  M.D.   On: 02/14/2015 07:47   ASSESSMENT AND PLAN:  Travis Cantrell is a 55 y.o. male with a known history of multiple sclerosis, hypertension, decubitus ulcer, Proteus mirabilis urinary tract infection presented to the emergency room with lethargy, confusion and difficulty breathing. Patient lives with roommates at home.   1. Acute hypoxic respiratory failure: due to septic shock with pneumonia -extubated y'day  -sputum culture GPC few CXR shows Cardiomegaly with pulmonary vascular congestion ECho EF 75% -decreased IVF -sats >92% on 2 liters  2. Severe sepsis due to urinary tract infection, pneumonia and chronic infected/colonized decubitus ulcer  -continue Zosyn, recent admission with Proteus in the urine.  -vanc has been d/ced -Blood culture 02/12/15 (PCR) showing coag staph negative -BC 02/12/2015 (routine) 2/2 no growth -UC GNR  3. Malignant HTN resume BP meds -IV prn metoprolol -cont lisinopril,amlodipine and add metoprolol 25 mg bid  4. Acute renal failure likely related to ATN and sepsis  -IV fluids -creat 1.78--->0.74 Decrease IVF -resolved -good uop  5. Abnormal troponin secondary to severe sepsis and renal failure - appreciate cardiology input -echo showed EF 75% (hyperdynamic)  6.Chronic  Decubitus ulcer continue current supportive care wound care consult done  7. Recurrent Urinary tract infection due to indwelling Foley catheter  UC GNR -pt on zosyn -recently completed course of keflex  8. 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 Hyperglycemia no h/o DM -?tube feeds -on SSI  A1c is 5.6%  10. Speech therapy to see. Resume diet. Pt needs assistance with feeding  Transfer to medical floor.  CODE STATUS:full DVT Prophylaxis: lovenox  TOTAL  TIME TAKING CARE OF THIS PATIENT:25 minutes.  >50% time spent on counselling and coordination of care   Note: This dictation was prepared with Dragon dictation along with smaller phrase  technology. Any transcriptional errors that result from this process are unintentional.  Akiem Urieta M.D on 02/15/2015 at 7:11 AM  Between 7am to 6pm - Pager - [phone]  After 6pm go to [email or web address] - password [email or web address] [facility]  [place] [facility]  Office  [phone]  CC: Primary care physician; Kerby Nora, MD

## 2015-02-15 NOTE — Progress Notes (Signed)
SUBJECTIVE: Patient patient was extubated but appear to be not responding to questioning.   Filed Vitals:   02/15/15 0500 02/15/15 0600 02/15/15 0700 02/15/15 0800  BP: 161/90 159/95 154/90 147/86  Pulse: 87 92 74 82  Temp:    100.4 F (38 C)  TempSrc:      Resp: 21 21 20 19   Height:      Weight: 194 lb 7.1 oz (88.2 kg)     SpO2: 98% 98% 97% 97%    Intake/Output Summary (Last 24 hours) at 02/15/15 1235 Last data filed at 02/15/15 0800  Gross per 24 hour  Intake 1040.83 ml  Output   2275 ml  Net -1234.17 ml    LABS: Basic Metabolic Panel:  Recent Labs  48/01/65 0358 02/14/15 0444  NA 135 140  K 4.1 3.7  CL 113* 110  CO2 23 26  GLUCOSE 171* 193*  BUN 23* 18  CREATININE 0.98 0.74  CALCIUM 7.7* 7.9*  MG 1.7  --   PHOS 1.4*  --    Liver Function Tests: No results for input(s): AST, ALT, ALKPHOS, BILITOT, PROT, ALBUMIN in the last 72 hours. No results for input(s): LIPASE, AMYLASE in the last 72 hours. CBC:  Recent Labs  02/13/15 0358 02/14/15 0444  WBC 15.1* 11.1*  HGB 11.3* 10.9*  HCT 35.5* 33.7*  MCV 79.6* 80.5  PLT 243 242   Cardiac Enzymes: No results for input(s): CKTOTAL, CKMB, CKMBINDEX, TROPONINI in the last 72 hours. BNP: Invalid input(s): POCBNP D-Dimer: No results for input(s): DDIMER in the last 72 hours. Hemoglobin A1C:  Recent Labs  02/14/15 0444  HGBA1C 5.6   Fasting Lipid Panel: No results for input(s): CHOL, HDL, LDLCALC, TRIG, CHOLHDL, LDLDIRECT in the last 72 hours. Thyroid Function Tests: No results for input(s): TSH, T4TOTAL, T3FREE, THYROIDAB in the last 72 hours.  Invalid input(s): FREET3 Anemia Panel: No results for input(s): VITAMINB12, FOLATE, FERRITIN, TIBC, IRON, RETICCTPCT in the last 72 hours.   PHYSICAL EXAM General: Well developed, well nourished, in no acute distress HEENT:  Normocephalic and atramatic Neck:  No JVD.  Lungs: Clear bilaterally to auscultation and percussion. Heart: HRRR . Normal S1 and S2  without gallops or murmurs.  Abdomen: Bowel sounds are positive, abdomen soft and non-tender  Msk:  Back normal, normal gait. Normal strength and tone for age. Extremities: No clubbing, cyanosis or edema.   Neuro: Alert and oriented X 3. Psych:  Good affect, responds appropriately  TELEMETRY: Sinus rhythm  ASSESSMENT AND PLAN: Congestive heart failure most likely secondary to diastolic dysfunction and sepsis.  Principal Problem:   Respiratory failure (HCC)    Travis Nancy, MD, [facility] 02/15/2015 12:35 PM

## 2015-02-15 NOTE — Progress Notes (Signed)
Extubated 01/21 without complications. Remains lethargic today but + F/C. Very weak (?baseline). Adequate cough. No distress  Filed Vitals:   02/15/15 1000 02/15/15 1100 02/15/15 1200 02/15/15 1300  BP: 150/97 153/97 166/100 160/102  Pulse: 84 98 87 70  Temp:   99 F (37.2 C)   TempSrc:      Resp: 23 20 19 21   Height:      Weight:      SpO2: 96% 96% 94% 97%   Chronically ill appearing, NAD HEENT WNL No JVD Few rhonchi, no wheezes Reg, no M NABS, soft Old B LE skin grafts noted, trace symmetric edema Diffusely weak, bilateral foot drop  BMP Latest Ref Rng 02/14/2015 02/13/2015 02/12/2015  Glucose 65 - 99 mg/dL 811(B) 147(W) 295(A)  BUN 6 - 20 mg/dL 18 21(H) 08(M)  Creatinine 0.61 - 1.24 mg/dL 5.78 4.69 6.29(B)  Sodium 135 - 145 mmol/L 140 135 134(L)  Potassium 3.5 - 5.1 mmol/L 3.7 4.1 4.2  Chloride 101 - 111 mmol/L 110 113(H) 105  CO2 22 - 32 mmol/L 26 23 19(L)  Calcium 8.9 - 10.3 mg/dL 7.9(L) 7.7(L) 8.6(L)    CBC Latest Ref Rng 02/14/2015 02/13/2015 02/12/2015  WBC 3.8 - 10.6 K/uL 11.1(H) 15.1(H) 28.6(H)  Hemoglobin 13.0 - 18.0 g/dL 10.9(L) 11.3(L) 14.2  Hematocrit 40.0 - 52.0 % 33.7(L) 35.5(L) 45.3  Platelets 150 - 440 K/uL 242 243 512(H)     CXR 01/21: NACPD  IMPRESSION: VDRF - intubated 01/19 - 01/21 due to AMS Septic shock, resolved AKI, resolving Hyperglycemia without prior hx of DM Severe sepsis due to pseudomonas UTI "+" blood cx is contaminant Severe MS with chronic indwelling Foley catheter  PLAN/REC: Continue to monitor in ICU/SDU post extubation - changed to SDU status 01/22 Cont supplemental O2 as needed Monitor BP, rhythm Monitor BMET intermittently Monitor I/Os Correct electrolytes as indicated Continue maintenance IVFs until meeting needs PO SLP eval ordered DC SSI Cont pip-tazo. The pseudomonas is sensitive to Cipro. Can transition to this med once able to take POs  Would complete 10 days of therapy total DVT px: enoxaparin Monitor CBC  intermittently Transfuse per usual guidelines Minimize sedatives/opioids   CCM time: 40 mins The above time includes time spent in consultation with patient and/or family members and reviewing care plan on multidisciplinary rounds  Billy Fischer, MD PCCM service Mobile [phone] Pager [phone]

## 2015-02-16 DIAGNOSIS — A4152 Sepsis due to Pseudomonas: Secondary | ICD-10-CM

## 2015-02-16 LAB — GLUCOSE, CAPILLARY
GLUCOSE-CAPILLARY: 100 mg/dL — AB (ref 65–99)
GLUCOSE-CAPILLARY: 121 mg/dL — AB (ref 65–99)
GLUCOSE-CAPILLARY: 87 mg/dL (ref 65–99)
GLUCOSE-CAPILLARY: 87 mg/dL (ref 65–99)
GLUCOSE-CAPILLARY: 95 mg/dL (ref 65–99)
Glucose-Capillary: 89 mg/dL (ref 65–99)
Glucose-Capillary: 146 mg/dL — ABNORMAL HIGH (ref 65–99)

## 2015-02-16 LAB — BASIC METABOLIC PANEL
Anion gap: 5 (ref 5–15)
BUN: 10 mg/dL (ref 6–20)
CHLORIDE: 106 mmol/L (ref 101–111)
CO2: 31 mmol/L (ref 22–32)
Calcium: 8.4 mg/dL — ABNORMAL LOW (ref 8.9–10.3)
Creatinine, Ser: 0.77 mg/dL (ref 0.61–1.24)
GFR calc Af Amer: >60 mL/min (ref >60)
GFR calc non Af Amer: >60 mL/min (ref >60)
Glucose, Bld: 98 mg/dL (ref 65–99)
POTASSIUM: 3.1 mmol/L — AB (ref 3.5–5.1)
SODIUM: 142 mmol/L (ref 135–145)

## 2015-02-16 MED ORDER — POTASSIUM CHLORIDE 10 MEQ/100ML IV SOLN
10.0000 meq | INTRAVENOUS | Status: AC
  Administered 2015-02-16 (×4): 10 meq via INTRAVENOUS
  Filled 2015-02-16 (×4): qty 100

## 2015-02-16 NOTE — Progress Notes (Signed)
Dr. Dema Severin present and gave order for patient to be moved to any med surg floor with no tele.

## 2015-02-16 NOTE — Progress Notes (Signed)
[facility] - [place] at [facility]   PATIENT NAME: Travis Cantrell    MR#:  [id number]  DATE OF BIRTH:  [birth year]  SUBJECTIVE:  S/p extubation 1/21 Cough+ Moved out of CCU No fever. Weak. More alert than y'day REVIEW OF SYSTEMS:   Review of Systems  Constitutional: Negative for fever, chills and weight loss.  HENT: Positive for congestion. Negative for ear discharge, ear pain and nosebleeds.   Eyes: Negative for blurred vision and pain.  Respiratory: Positive for cough and sputum production. Negative for shortness of breath, wheezing and stridor.   Cardiovascular: Negative for chest pain, palpitations, orthopnea and PND.  Gastrointestinal: Negative for nausea, abdominal pain and diarrhea.  Genitourinary: Negative for urgency and frequency.  Musculoskeletal: Negative for back pain and joint pain.  Neurological: Positive for weakness. Negative for sensory change, speech change and focal weakness.  Psychiatric/Behavioral: Negative for depression and hallucinations. The patient is not nervous/anxious.   All other systems reviewed and are negative.  Tolerating Diet:TF Tolerating PT: paraplegic  DRUG ALLERGIES:   Allergies  Allergen Reactions  . Baclofen Diarrhea    VITALS:  Blood pressure 149/97, pulse 86, temperature 98.2 F (36.8 C), temperature source Oral, resp. rate 17, height 6' (1.829 m), weight 84.8 kg (186 lb 15.2 oz), SpO2 97 %.  PHYSICAL EXAMINATION:   Physical Exam  GENERAL:  55 y.o.-year-old patient lying in the bed with no acute distress. Critically ill EYES: Pupils equal, round, reactive to light and accommodation. No scleral icterus. Extraocular muscles intact.  HEENT: Head atraumatic, normocephalic. Oropharynx and nasopharynx clear.  NECK:  Supple, no jugular venous distention. No thyroid enlargement, no tenderness.  LUNGS: decreased breath sounds bilaterally, no wheezing, rales, rhonchi. No use of accessory muscles of respiration.   CARDIOVASCULAR: S1, S2 normal. No murmurs, rubs, or gallops.  ABDOMEN: Soft, nontender, nondistended. Bowel sounds present. No organomegaly or mass.  EXTREMITIES: No cyanosis, clubbing or edema b/l.   Heel protectors+, great toe nails severe onychomycosis, muscle wasting lower extremity contractures+, chronic NEUROLOGIC:chornic functional paraplegia  PSYCHIATRIC: Alert and mild confusion  SKIN: No obvious rash, lesion Chronic decubitus ulcer.   LABORATORY PANEL:  CBC  Recent Labs Lab 02/14/15 0444  WBC 11.1*  HGB 10.9*  HCT 33.7*  PLT 242    Chemistries   Recent Labs Lab 02/12/15 0104 02/13/15 0358  02/16/15 0445  NA 134* 135  < > 142  K 4.2 4.1  < > 3.1*  CL 105 113*  < > 106  CO2 19* 23  < > 31  GLUCOSE 254* 171*  < > 98  BUN 30* 23*  < > 10  CREATININE 1.78* 0.98  < > 0.77  CALCIUM 8.6* 7.7*  < > 8.4*  MG  --  1.7  --   --   AST 33  --   --   --   ALT 34  --   --   --   ALKPHOS 105  --   --   --   BILITOT 1.2  --   --   --   < > = values in this interval not displayed.  Cardiac Enzymes  Recent Labs Lab 02/12/15 0424  TROPONINI 0.25*   RADIOLOGY:  No results found. ASSESSMENT AND PLAN:  Travis Cantrell is a 55 y.o. male with a known history of multiple sclerosis, hypertension, decubitus ulcer, Proteus mirabilis urinary tract infection presented to the emergency room with lethargy, confusion and difficulty breathing. Patient lives with roommates  at home.   1. Acute hypoxic respiratory failure: due to septic shock with pneumonia -extubated 02/14/15. Doing well.  Weak and deconidtioned -sputum culture GPC few CXR shows Cardiomegaly with pulmonary vascular congestion ECho EF 75% -d/c IVF -sats >92% on 2 liters  2. Severe sepsis due to urinary tract infection, pneumonia and chronic infected/colonized decubitus ulcer  -continue Zosyn, recent admission with Proteus in the urine.  -vanc has been d/ced -Blood culture 02/12/15 (PCR) showing coag staph  negative -BC 02/12/2015 (routine) 2/2 no growth -UC pseudomonas (sensitive to Zosyn, carbapenem and ceftazidime)  3. Malignant HTN resume BP meds -IV prn metoprolol -cont lisinopril,amlodipine and add metoprolol 25 mg bid  4. Acute renal failure likely related to ATN and sepsis  -IV fluids -creat 1.78--->0.74 d/ IVF -resolved -good uop  5. Abnormal troponin secondary to severe sepsis and renal failure - appreciate cardiology input -echo showed EF 75% (hyperdynamic)  6.Chronic  Decubitus ulcer continue current supportive care wound care consult done  7. Recurrent Urinary tract infection due to indwelling Foley catheter  UC GNR -pt on zosyn -recently completed course of keflex  8. 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 Hyperglycemia no h/o DM - could be due to tube feeds while being on the vent -on SSI  A1c is 5.6%  10. Speech therapy eval noted. No signs of oral aspiration  Resume diet. Pt needs assistance with feeding  Transfer to medical floor. Care management for discharge planning  CODE STATUS:full DVT Prophylaxis: lovenox  TOTAL  TIME TAKING CARE OF THIS PATIENT:25 minutes.  >50% time spent on counselling and coordination of care   Note: This dictation was prepared with Dragon dictation along with smaller phrase technology. Any transcriptional errors that result from this process are unintentional.  Kellyjo Edgren M.D on 02/16/2015 at 3:54 PM  Between 7am to 6pm - Pager - [phone]  After 6pm go to [email or web address] - password [email or web address] [facility]  [place] [facility]  Office  [phone]  CC: Primary care physician; Kerby Nora, MD

## 2015-02-16 NOTE — Progress Notes (Signed)
[facility]  Patient Status: active  [facility] is providing the following services: SN  If patient discharges after hours, please call [phone].   Dimple Casey 02/16/2015, 8:57 AM

## 2015-02-16 NOTE — Progress Notes (Signed)
Extubated 01/21 without complications. improved mentation, following commands, can verbalized issues, back to baseline. Still weak (?baseline). Adequate cough. No distress Indwelling foley cath changed in the ER upon admission.   Filed Vitals:   02/16/15 0600 02/16/15 0700 02/16/15 0800 02/16/15 0900  BP: 145/90 151/83 149/86 164/102  Pulse: 75 73 63 79  Temp:      TempSrc:      Resp:  19 20 20   Height:      Weight:      SpO2: 98% 98% 99% 98%   Chronically ill appearing, NAD HEENT WNL No JVD Few rhonchi, no wheezes Reg, no M NABS, soft Old B LE skin grafts noted, trace symmetric edema Diffusely weak, bilateral foot drop  BMP Latest Ref Rng 02/16/2015 02/14/2015 02/13/2015  Glucose 65 - 99 mg/dL 98 532(D) 924(Q)  BUN 6 - 20 mg/dL 10 18 68(T)  Creatinine 0.61 - 1.24 mg/dL 4.19 6.22 2.97  Sodium 135 - 145 mmol/L 142 140 135  Potassium 3.5 - 5.1 mmol/L 3.1(L) 3.7 4.1  Chloride 101 - 111 mmol/L 106 110 113(H)  CO2 22 - 32 mmol/L 31 26 23   Calcium 8.9 - 10.3 mg/dL 9.8(X) 7.9(L) 7.7(L)    CBC Latest Ref Rng 02/14/2015 02/13/2015 02/12/2015  WBC 3.8 - 10.6 K/uL 11.1(H) 15.1(H) 28.6(H)  Hemoglobin 13.0 - 18.0 g/dL 10.9(L) 11.3(L) 14.2  Hematocrit 40.0 - 52.0 % 33.7(L) 35.5(L) 45.3  Platelets 150 - 440 K/uL 242 243 512(H)     IMPRESSION: VDRF - intubated 01/19 - 01/21 due to AMS Septic shock, resolved AKI, resolving Hyperglycemia without prior hx of DM Severe sepsis due to pseudomonas UTI - resolving "+" blood cx is contaminant Severe MS with chronic indwelling Foley catheter  PLAN/REC: Transfer to Med Surg unit SLP eval Cont supplemental O2 as needed Monitor BP, rhythm Monitor BMET intermittently Monitor I/Os Correct electrolytes as indicated Continue maintenance IVFs until meeting needs PO SLP eval ordered DC SSI Cont pip-tazo. The pseudomonas is sensitive to Cipro. Can transition to this med once able to take POs  Would complete 10 days of therapy total DVT px:  enoxaparin Monitor CBC intermittently Transfuse per usual guidelines Minimize sedatives/opioids   CCM time: 40 mins The above time includes time spent in consultation with patient and/or family members and reviewing care plan on multidisciplinary rounds  Stephanie Acre, MD [facility] Pager [phone] (please enter 7-digits) On Call Pager - [phone] (please enter 7-digits)

## 2015-02-16 NOTE — Plan of Care (Signed)
Problem: Nutrition: Goal: Adequate nutrition will be maintained Outcome: Not Progressing Transfer from CCU, alert and oriented, bed bound, poor mobility, daughter at bedside, IV lnfusing infusing, poor appetite, total care and needs assist with feeding, vitals stable at time of transfer. Multiple skin issues, drowsy and sleeping in between care.

## 2015-02-16 NOTE — Progress Notes (Signed)
[facility] - [place] at [facility]   PATIENT NAME: Travis Cantrell    MR#:  [id number]  DATE OF BIRTH:  [birth year]  SUBJECTIVE:  S/p extubation 1/21 Cough+ Moved out of CCU No fever. Weak. More alert than y'day REVIEW OF SYSTEMS:   Review of Systems  Constitutional: Negative for fever, chills and weight loss.  HENT: Positive for congestion. Negative for ear discharge, ear pain and nosebleeds.   Eyes: Negative for blurred vision and pain.  Respiratory: Positive for cough and sputum production. Negative for shortness of breath, wheezing and stridor.   Cardiovascular: Negative for chest pain, palpitations, orthopnea and PND.  Gastrointestinal: Negative for nausea, abdominal pain and diarrhea.  Genitourinary: Negative for urgency and frequency.  Musculoskeletal: Negative for back pain and joint pain.  Neurological: Positive for weakness. Negative for sensory change, speech change and focal weakness.  Psychiatric/Behavioral: Negative for depression and hallucinations. The patient is not nervous/anxious.   All other systems reviewed and are negative.  Tolerating Diet:TF Tolerating PT: paraplegic  DRUG ALLERGIES:   Allergies  Allergen Reactions  . Baclofen Diarrhea    VITALS:  Blood pressure 149/97, pulse 86, temperature 98.2 F (36.8 C), temperature source Oral, resp. rate 17, height 6' (1.829 m), weight 84.8 kg (186 lb 15.2 oz), SpO2 97 %.  PHYSICAL EXAMINATION:   Physical Exam  GENERAL:  55 y.o.-year-old patient lying in the bed with no acute distress. Critically ill EYES: Pupils equal, round, reactive to light and accommodation. No scleral icterus. Extraocular muscles intact.  HEENT: Head atraumatic, normocephalic. Oropharynx and nasopharynx clear.  NECK:  Supple, no jugular venous distention. No thyroid enlargement, no tenderness.  LUNGS: decreased breath sounds bilaterally, no wheezing, rales, rhonchi. No use of accessory muscles of respiration.   CARDIOVASCULAR: S1, S2 normal. No murmurs, rubs, or gallops.  ABDOMEN: Soft, nontender, nondistended. Bowel sounds present. No organomegaly or mass.  EXTREMITIES: No cyanosis, clubbing or edema b/l.   Heel protectors+, great toe nails severe onychomycosis, muscle wasting lower extremity contractures+, chronic NEUROLOGIC:chornic functional paraplegia  PSYCHIATRIC: Alert and mild confusion  SKIN: No obvious rash, lesion Chronic decubitus ulcer.   LABORATORY PANEL:  CBC  Recent Labs Lab 02/14/15 0444  WBC 11.1*  HGB 10.9*  HCT 33.7*  PLT 242    Chemistries   Recent Labs Lab 02/12/15 0104 02/13/15 0358  02/16/15 0445  NA 134* 135  < > 142  K 4.2 4.1  < > 3.1*  CL 105 113*  < > 106  CO2 19* 23  < > 31  GLUCOSE 254* 171*  < > 98  BUN 30* 23*  < > 10  CREATININE 1.78* 0.98  < > 0.77  CALCIUM 8.6* 7.7*  < > 8.4*  MG  --  1.7  --   --   AST 33  --   --   --   ALT 34  --   --   --   ALKPHOS 105  --   --   --   BILITOT 1.2  --   --   --   < > = values in this interval not displayed.  Cardiac Enzymes  Recent Labs Lab 02/12/15 0424  TROPONINI 0.25*   RADIOLOGY:  No results found. ASSESSMENT AND PLAN:  Travis Cantrell is a 55 y.o. male with a known history of multiple sclerosis, hypertension, decubitus ulcer, Proteus mirabilis urinary tract infection presented to the emergency room with lethargy, confusion and difficulty breathing. Patient lives with roommates  at home.   1. Acute hypoxic respiratory failure: due to septic shock with pneumonia -extubated 02/14/15. Doing well.  Weak and deconidtioned -sputum culture GPC few CXR shows Cardiomegaly with pulmonary vascular congestion ECho EF 75% -d/c IVF -sats >92% on 2 liters  2. Severe sepsis due to urinary tract infection, pneumonia and chronic infected/colonized decubitus ulcer  -continue Zosyn, recent admission with Proteus in the urine.  -vanc has been d/ced -Blood culture 02/12/15 (PCR) showing coag staph  negative -BC 02/12/2015 (routine) 2/2 no growth -UC pseudomonas (sensitive to Zosyn, carbapenem and ceftazidime)  3. Malignant HTN resume BP meds -IV prn metoprolol -cont lisinopril,amlodipine and add metoprolol 25 mg bid  4. Acute renal failure likely related to ATN and sepsis  -IV fluids -creat 1.78--->0.74 d/ IVF -resolved -good uop  5. Abnormal troponin secondary to severe sepsis and renal failure - appreciate cardiology input -echo showed EF 75% (hyperdynamic)  6.Chronic  Decubitus ulcer continue current supportive care wound care consult done  7. Recurrent Urinary tract infection due to indwelling Foley catheter  UC GNR -pt on zosyn -recently completed course of keflex  8. 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 Hyperglycemia no h/o DM -?tube feeds -on SSI  A1c is 5.6%  10. Speech therapy eval noted. No signs of oral aspiration  Resume diet. Pt needs assistance with feeding  Transfer to medical floor. Care management for discharge planning  CODE STATUS:full DVT Prophylaxis: lovenox  TOTAL  TIME TAKING CARE OF THIS PATIENT:25 minutes.  >50% time spent on counselling and coordination of care   Note: This dictation was prepared with Dragon dictation along with smaller phrase technology. Any transcriptional errors that result from this process are unintentional.  Yifan Auker M.D on 02/16/2015 at 3:48 PM  Between 7am to 6pm - Pager - [phone]  After 6pm go to [email or web address] - password [email or web address] [facility]  [place] [facility]  Office  [phone]  CC: Primary care physician; Kerby Nora, MD

## 2015-02-16 NOTE — Progress Notes (Signed)
SUBJECTIVE: Remains intubated  Filed Vitals:   02/16/15 0500 02/16/15 0600 02/16/15 0700 02/16/15 0800  BP: 140/82 145/90 151/83 149/86  Pulse: 65 75 73 63  Temp:      TempSrc:      Resp:   19 20  Height:      Weight:      SpO2: 99% 98% 98% 99%    Intake/Output Summary (Last 24 hours) at 02/16/15 0832 Last data filed at 02/16/15 0800  Gross per 24 hour  Intake   1650 ml  Output   2890 ml  Net  -1240 ml    LABS: Basic Metabolic Panel:  Recent Labs  84/16/60 0444 02/16/15 0445  NA 140 142  K 3.7 3.1*  CL 110 106  CO2 26 31  GLUCOSE 193* 98  BUN 18 10  CREATININE 0.74 0.77  CALCIUM 7.9* 8.4*   Liver Function Tests: No results for input(s): AST, ALT, ALKPHOS, BILITOT, PROT, ALBUMIN in the last 72 hours. No results for input(s): LIPASE, AMYLASE in the last 72 hours. CBC:  Recent Labs  02/14/15 0444  WBC 11.1*  HGB 10.9*  HCT 33.7*  MCV 80.5  PLT 242   Cardiac Enzymes: No results for input(s): CKTOTAL, CKMB, CKMBINDEX, TROPONINI in the last 72 hours. BNP: Invalid input(s): POCBNP D-Dimer: No results for input(s): DDIMER in the last 72 hours. Hemoglobin A1C:  Recent Labs  02/14/15 0444  HGBA1C 5.6   Fasting Lipid Panel: No results for input(s): CHOL, HDL, LDLCALC, TRIG, CHOLHDL, LDLDIRECT in the last 72 hours. Thyroid Function Tests: No results for input(s): TSH, T4TOTAL, T3FREE, THYROIDAB in the last 72 hours.  Invalid input(s): FREET3 Anemia Panel: No results for input(s): VITAMINB12, FOLATE, FERRITIN, TIBC, IRON, RETICCTPCT in the last 72 hours.   PHYSICAL EXAM General: Well developed, well nourished, in no acute distress HEENT:  Normocephalic and atramatic Neck:  No JVD.  Lungs: Clear bilaterally to auscultation and percussion. Heart: HRRR . Normal S1 and S2 without gallops or murmurs.  Abdomen: Bowel sounds are positive, abdomen soft and non-tender  Msk:  Back normal, normal gait. Normal strength and tone for age. Extremities: No  clubbing, cyanosis or edema.   Neuro: Alert and oriented X 3. Psych:  Good affect, responds appropriately  TELEMETRY: Sinus rhythm  ASSESSMENT AND PLAN: Congestive heart failure due to diastolic dysfunction. Sepsis and status post respiratory failure doing very well after being extubated.  Principal Problem:   Respiratory failure (HCC)    Laurier Nancy, MD, [facility] 02/16/2015 8:32 AM

## 2015-02-16 NOTE — Progress Notes (Signed)
Report called to French Ana, RN on 1C.

## 2015-02-16 NOTE — Progress Notes (Signed)
Patient moved to room 112 by bed with Marijean Niemann, orderly.  Sister at side. Patient alert with no distress noted when leaving ICU.

## 2015-02-16 NOTE — Plan of Care (Signed)
Problem: SLP Dysphagia Goals Goal: Misc Dysphagia Goal Pt will safely tolerate po diet of least restrictive consistency w/ no overt s/s of aspiration noted by Staff/pt/family x3 sessions.    

## 2015-02-16 NOTE — Progress Notes (Signed)
Nutrition Follow-up   INTERVENTION:   Meals and Snacks: Spoke with SLP and MD in rounds, pt remains weak from intubation, recommending small amounts of po with RN assistance. MD agreeable to only Mighty shake and Magic cup at this time. Clarified with dining services. Medical Food Supplement Therapy: Mighty Shakes and Magic cup on meal trays TID for added nutrition (each supplement provides approximately 300kcals and 9g protein)   NUTRITION DIAGNOSIS:   Inadequate oral intake related to acute illness as evidenced by NPO status.  GOAL:   Patient will meet greater than or equal to 90% of their needs  MONITOR:    (Energy Intake, Anthropometrics, Digestive System, Electrolyte/Renal Profile, Glucose Profile)  REASON FOR ASSESSMENT:   Ventilator    ASSESSMENT:    Pt s/p extubation, since moved out of ICU.  Diet Order:  DIET - DYS 1 Room service appropriate?: Yes with Assist; Fluid consistency:: Nectar Thick   Current Nutrition: Pt ate roughly 12 tsp with SLP this am. SLP reports pt eating very slowly, still presents with weakness while swallowing.   Gastrointestinal Profile: Last BM: 02/14/2015   Scheduled Medications:  . antiseptic oral rinse  7 mL Mouth Rinse q12n4p  . chlorhexidine  15 mL Mouth Rinse BID  . enoxaparin (LOVENOX) injection  40 mg Subcutaneous Q24H  . metoprolol  5 mg Intravenous 4 times per day  . pantoprazole (PROTONIX) IV  40 mg Intravenous Q24H  . piperacillin-tazobactam (ZOSYN)  IV  3.375 g Intravenous 3 times per day    Continuous Medications:  . dextrose 5% lactated ringers with KCl 20 mEq/L 50 mL/hr at 02/16/15 0600   D5 providing 204kcals in 24 hours   Electrolyte/Renal Profile and Glucose Profile:   Recent Labs Lab 02/13/15 0358 02/14/15 0444 02/16/15 0445  NA 135 140 142  K 4.1 3.7 3.1*  CL 113* 110 106  CO2 BUN 23* 18 10  CREATININE 0.98 0.74 0.77  CALCIUM 7.7* 7.9* 8.4*  MG 1.7  --   --   PHOS 1.4*  --   --    GLUCOSE 171* 193* 98   Protein Profile:  Recent Labs Lab 02/12/15 0104  ALBUMIN 3.5     Weight Trend since Admission: Filed Weights   02/14/15 0400 02/15/15 0500 02/16/15 0459  Weight: 209 lb 7 oz (95 kg) 194 lb 7.1 oz (88.2 kg) 186 lb 15.2 oz (84.8 kg)     BMI:  Body mass index is 25.35 kg/(m^2).  Estimated Nutritional Needs:   Kcal:  BEE: 1823kcals, TEE: (IF 1.0-1.2)(AF 1.2) [id number]  Protein:  104-123g protein (1.1-1.3g/kg)  Fluid:  2225-2670 mL (25-30 ml/kg)    HIGH Care Level  Leda Quail, RD, LDN Pager [phone] Weekend/On-Call Pager [phone]

## 2015-02-16 NOTE — Progress Notes (Signed)
eLink Physician-Brief Progress Note Patient Name: Travis Cantrell DOB: [birth year] MRN: [id number]   Date of Service  02/16/2015  HPI/Events of Note  K+ = 3.1 and Creatinine = 0.77.  eICU Interventions  Will replete K+.     Intervention Category Intermediate Interventions: Electrolyte abnormality - evaluation and management  Sommer,Steven Eugene 02/16/2015, 5:51 AM

## 2015-02-16 NOTE — Evaluation (Signed)
Clinical/Bedside Swallow Evaluation Patient Details  Name: Travis Cantrell MRN: [id number] Date of Birth: Jan 20, 1961  Today's Date: 02/16/2015 Time: SLP Start Time (ACUTE ONLY): 1030 SLP Stop Time (ACUTE ONLY): 1125 SLP Time Calculation (min) (ACUTE ONLY): 55 min  Past Medical History:  Past Medical History  Diagnosis Date  . Allergy   . GERD (gastroesophageal reflux disease)   . MS (multiple sclerosis) (HCC)   . Hypertension   . Decubitus ulcer    Past Surgical History:  Past Surgical History  Procedure Laterality Date  . Vein reconsruction    . Rotator cuff repair     HPI:  Pt is a 55 y.o. male with a known history of multiple sclerosis, hypertension, GERD, decubitus ulcer, Proteus mirabilis urinary tract infection presented to the emergency room with lethargy, confusion and difficulty breathing. Patient lives with roommates at home. They found him with decreased responsiveness and EMS was called. When EMS arrived at patient's house he was having labored breathing with decreased responsiveness. Patient was bagged by the EMS and brought to the emergency emergency room. In the emergency room patient was electively intubated and put on ventilator for respiratory failure and for hypoxia and and decreased O2 saturation. Also had a fever of 10 63F. Not much history could be (the patient. Agent was given etomidate and rocuronium prior to intubation. She was resuscitated with IV fluids, 4 L of fluids were given for resuscitation. Initially hypotensive but later on after fluid resuscitation was done blood pressure responded and improved to 110/85 mmHg. His fever came down to 10 91F. Patient was given IV vancomycin and IV Zosyn antibiotics in the emergency room. Patient has a chronic indwelling Foley catheter and cloudy urine was observed and was poorly maintained. Patient was recently discharged from our hospital on 02/01/2014 after being managed for urinary tract infection and abdominal pain. Pt was  extubated 02/15/15 and has tolerated such. He appears to still be drowsy from meds; NSG indicated such might still be wearing off. Pt was verbal w/ SLP and Sister in room answering few basic questions re: self: "I like cream in my coffee". He appeared weak and was slow to respond. Verbal and tactile cues were required during the evaluation to help pt maintain alertness.    Assessment / Plan / Recommendation Clinical Impression  Pt appears to present w/ increased risk for aspiration at this time sec. to his drowsiness and quick to fatigue presentation. Pt's overall presentation is impacted by his baseline dx of MS; pt appears extremely weak and presentation w/ oral phase deficits in conjunction. Bolus management was slow and deliberate; bolus were transferred slowly but cleared appropriately post swalllowing. Verbal cues were given to facilitate and maintain pt's alertness intermittently. Pt required feeding assistance d/t his weakness. Although, no overt s/s of aspiration were noted, suspect risk for aspiration is increased. Rec. a modified diet consistency w/ limited po's initially and strict aspiration precautions. This was discussed w/ MD who agreed. Rec. ST f/u to continue to assess toleration of diet and trials to upgrade as indicated.     Aspiration Risk  Moderate aspiration risk    Diet Recommendation  Dys. 1 w/ Nectar consistency liquids; aspiration precaution; feeding assistance.  Medication Administration: Crushed with puree    Other  Recommendations Recommended Consults:  (Dietician) Oral Care Recommendations: Staff/trained caregiver to provide oral care;Oral care BID   Follow up Recommendations  Skilled Nursing facility (TBD)    Frequency and Duration min 3x week  2 weeks  Prognosis Prognosis for Safe Diet Advancement: Fair Barriers to Reach Goals:  (MS)      Swallow Study   General Date of Onset: 02/12/15 HPI: Pt is a 55 y.o. male with a known history of multiple  sclerosis, hypertension, GERD, decubitus ulcer, Proteus mirabilis urinary tract infection presented to the emergency room with lethargy, confusion and difficulty breathing. Patient lives with roommates at home. They found him with decreased responsiveness and EMS was called. When EMS arrived at patient's house he was having labored breathing with decreased responsiveness. Patient was bagged by the EMS and brought to the emergency emergency room. In the emergency room patient was electively intubated and put on ventilator for respiratory failure and for hypoxia and and decreased O2 saturation. Also had a fever of 10 12F. Not much history could be (the patient. Agent was given etomidate and rocuronium prior to intubation. She was resuscitated with IV fluids, 4 L of fluids were given for resuscitation. Initially hypotensive but later on after fluid resuscitation was done blood pressure responded and improved to 110/85 mmHg. His fever came down to 10 60F. Patient was given IV vancomycin and IV Zosyn antibiotics in the emergency room. Patient has a chronic indwelling Foley catheter and cloudy urine was observed and was poorly maintained. Patient was recently discharged from our hospital on 02/01/2014 after being managed for urinary tract infection and abdominal pain. Pt was extubated 02/15/15 and has tolerated such. He appears to still be drowsy from meds; NSG indicated such might still be wearing off. Pt was verbal w/ SLP and Sister in room answering few basic questions re: self: "I like cream in my coffee". He appeared weak and was slow to respond. Verbal and tactile cues were required during the evaluation to help pt maintain alertness.  Type of Study: Bedside Swallow Evaluation Previous Swallow Assessment: none indicated by Sister Diet Prior to this Study: NPO (currently; at a regular diet at home per Sister) Temperature Spikes Noted: Yes (100.4 last shift; 11/1 wbc) Respiratory Status: Nasal cannula (3  liters) History of Recent Intubation: Yes Length of Intubations (days): 3 days Date extubated: 02/15/15 Behavior/Cognition: Cooperative;Pleasant mood;Lethargic/Drowsy;Requires cueing Oral Cavity Assessment: Dry (sticky) Oral Care Completed by SLP: Yes Oral Cavity - Dentition: Adequate natural dentition Vision:  (n/a; uses L arm only) Self-Feeding Abilities: Total assist Patient Positioning: Upright in bed Baseline Vocal Quality: Low vocal intensity Volitional Cough: Strong;Congested Volitional Swallow: Able to elicit (slow)    Oral/Motor/Sensory Function Overall Oral Motor/Sensory Function: Moderate impairment Facial Symmetry: Within Functional Limits Lingual ROM:  (reduced) Lingual Symmetry: Within Functional Limits Lingual Strength: Reduced Velum:  (CNT) Mandible:  (reduced)   Ice Chips Ice chips: Impaired Presentation: Spoon (fed; 5 trials) Oral Phase Impairments: Reduced labial seal;Reduced lingual movement/coordination Oral Phase Functional Implications: Prolonged oral transit Pharyngeal Phase Impairments:  (none)   Thin Liquid Thin Liquid: Not tested    Nectar Thick Nectar Thick Liquid: Impaired Presentation: Spoon (9 trials) Oral Phase Impairments: Reduced labial seal;Reduced lingual movement/coordination Oral phase functional implications: Prolonged oral transit Pharyngeal Phase Impairments:  (none)   Honey Thick Honey Thick Liquid: Not tested   Puree Puree: Impaired Presentation: Spoon (1/2 tsp amounts; 6 tirals) Oral Phase Impairments: Reduced labial seal;Reduced lingual movement/coordination Oral Phase Functional Implications: Prolonged oral transit Pharyngeal Phase Impairments:  (none)   Solid   GO   Solid: Not tested       Jerilynn Som, MS, CCC-SLP  Kallie Depolo 02/16/2015,11:25 AM

## 2015-02-17 LAB — COMPREHENSIVE METABOLIC PANEL
ALBUMIN: 2.6 g/dL — AB (ref 3.5–5.0)
ALK PHOS: 50 U/L (ref 38–126)
ALT: 13 U/L — AB (ref 17–63)
AST: 12 U/L — AB (ref 15–41)
Anion gap: 5 (ref 5–15)
BILIRUBIN TOTAL: 0.5 mg/dL (ref 0.3–1.2)
BUN: 12 mg/dL (ref 6–20)
CALCIUM: 8.5 mg/dL — AB (ref 8.9–10.3)
CO2: 30 mmol/L (ref 22–32)
CREATININE: 0.86 mg/dL (ref 0.61–1.24)
Chloride: 107 mmol/L (ref 101–111)
GFR calc Af Amer: >60 mL/min (ref >60)
GLUCOSE: 188 mg/dL — AB (ref 65–99)
Potassium: 3.3 mmol/L — ABNORMAL LOW (ref 3.5–5.1)
Sodium: 142 mmol/L (ref 135–145)
TOTAL PROTEIN: 6.3 g/dL — AB (ref 6.5–8.1)

## 2015-02-17 LAB — CULTURE, BLOOD (ROUTINE X 2): CULTURE: NO GROWTH

## 2015-02-17 LAB — GLUCOSE, CAPILLARY
Glucose-Capillary: 100 mg/dL — ABNORMAL HIGH (ref 65–99)
Glucose-Capillary: 156 mg/dL — ABNORMAL HIGH (ref 65–99)

## 2015-02-17 LAB — MAGNESIUM: MAGNESIUM: 1.8 mg/dL (ref 1.7–2.4)

## 2015-02-17 LAB — HEPARIN INDUCED PLATELET AB (HIT ANTIBODY): Heparin Induced Plt Ab: 0.36 OD (ref 0.000–0.400)

## 2015-02-17 MED ORDER — SENNOSIDES-DOCUSATE SODIUM 8.6-50 MG PO TABS
1.0000 | ORAL_TABLET | Freq: Two times a day (BID) | ORAL | Status: DC
  Administered 2015-02-17 – 2015-02-18 (×2): 1 via ORAL
  Filled 2015-02-17 (×3): qty 1

## 2015-02-17 MED ORDER — PANTOPRAZOLE SODIUM 40 MG PO TBEC
40.0000 mg | DELAYED_RELEASE_TABLET | Freq: Every day | ORAL | Status: DC
  Administered 2015-02-18: 08:00:00 40 mg via ORAL
  Filled 2015-02-17: qty 1

## 2015-02-17 MED ORDER — POTASSIUM CHLORIDE 20 MEQ PO PACK
40.0000 meq | PACK | Freq: Once | ORAL | Status: AC
  Administered 2015-02-17: 40 meq via ORAL
  Filled 2015-02-17: qty 2

## 2015-02-17 MED ORDER — POTASSIUM CHLORIDE CRYS ER 20 MEQ PO TBCR: 40.0000 meq | EXTENDED_RELEASE_TABLET | Freq: Once | ORAL | Status: DC

## 2015-02-17 MED ORDER — CIPROFLOXACIN HCL 500 MG PO TABS
500.0000 mg | ORAL_TABLET | Freq: Two times a day (BID) | ORAL | Status: DC
  Administered 2015-02-17 – 2015-02-18 (×2): 500 mg via ORAL
  Filled 2015-02-17 (×2): qty 1

## 2015-02-17 MED ORDER — AMLODIPINE BESYLATE 5 MG PO TABS
5.0000 mg | ORAL_TABLET | Freq: Every day | ORAL | Status: DC
  Administered 2015-02-17 – 2015-02-18 (×2): 5 mg via ORAL
  Filled 2015-02-17 (×2): qty 1

## 2015-02-17 NOTE — Progress Notes (Signed)
[facility] - [place] at [facility]   PATIENT NAME: Travis Cantrell    MR#:  [id number]  DATE OF BIRTH:  December 30, 1960  SUBJECTIVE:  S/p extubation 1/21 and moved out of CCU Cough+ Feeling better  REVIEW OF SYSTEMS:   Review of Systems  Constitutional: Negative for fever, chills and weight loss.  HENT: Positive for congestion. Negative for ear discharge, ear pain and nosebleeds.   Eyes: Negative for blurred vision and pain.  Respiratory: Positive for cough and sputum production. Negative for shortness of breath, wheezing and stridor.   Cardiovascular: Negative for chest pain, palpitations, orthopnea and PND.  Gastrointestinal: Negative for nausea, abdominal pain and diarrhea.  Genitourinary: Negative for urgency and frequency.  Musculoskeletal: Negative for back pain and joint pain.  Neurological: Positive for weakness. Negative for sensory change, speech change and focal weakness.  Psychiatric/Behavioral: Negative for depression and hallucinations. The patient is not nervous/anxious.   All other systems reviewed and are negative.  Tolerating Diet:TF Tolerating PT: paraplegic  DRUG ALLERGIES:   Allergies  Allergen Reactions  . Baclofen Diarrhea    VITALS:  Blood pressure 152/81, pulse 71, temperature 98.7 F (37.1 C), temperature source Oral, resp. rate 20, height 6' (1.829 m), weight 86.864 kg (191 lb 8 oz), SpO2 100 %.  PHYSICAL EXAMINATION:   Physical Exam  GENERAL:  55 y.o.-year-old patient lying in the bed with no acute distress. Critically ill EYES: Pupils equal, round, reactive to light and accommodation. No scleral icterus. Extraocular muscles intact.  HEENT: Head atraumatic, normocephalic. Oropharynx and nasopharynx clear.  NECK:  Supple, no jugular venous distention. No thyroid enlargement, no tenderness.  LUNGS: decreased breath sounds bilaterally, no wheezing, rales, rhonchi. No use of accessory muscles of respiration.  CARDIOVASCULAR: S1,  S2 normal. No murmurs, rubs, or gallops.  ABDOMEN: Soft, nontender, nondistended. Bowel sounds present. No organomegaly or mass.  EXTREMITIES: No cyanosis, clubbing or edema b/l.   Heel protectors+, great toe nails severe onychomycosis, muscle wasting lower extremity contractures+, chronic NEUROLOGIC:chornic functional paraplegia  PSYCHIATRIC: Alert and mild confusion  SKIN: No obvious rash, lesion Chronic decubitus ulcer.   LABORATORY PANEL:  CBC  Recent Labs Lab 02/14/15 0444  WBC 11.1*  HGB 10.9*  HCT 33.7*  PLT 242    Chemistries   Recent Labs Lab 02/17/15 1200  NA 142  K 3.3*  CL 107  CO2 30  GLUCOSE 188*  BUN 12  CREATININE 0.86  CALCIUM 8.5*  MG 1.8  AST 12*  ALT 13*  ALKPHOS 50  BILITOT 0.5    Cardiac Enzymes  Recent Labs Lab 02/12/15 0424  TROPONINI 0.25*   RADIOLOGY:  No results found. ASSESSMENT AND PLAN:  Travis Cantrell is a 55 y.o. male with a known history of multiple sclerosis, hypertension, decubitus ulcer, Proteus mirabilis urinary tract infection presented to the emergency room with lethargy, confusion and difficulty breathing. Patient lives with roommates at home.   1. Acute hypoxic respiratory failure: due to septic shock with pneumonia -extubated 02/14/15. Doing well.  Weak and deconidtioned -sputum culture GPC few CXR shows Cardiomegaly with pulmonary vascular congestion ECho EF 75% -d/c IVF -sats >92% on 2 liters  2. Severe sepsis due to urinary tract infection, pneumonia and chronic infected/colonized decubitus ulcer  -continue Zosyn, recent admission with Proteus in the urine.  -vanc has been d/ced -Blood culture 02/12/15 (PCR) showing coag staph negative -BC 02/12/2015 (routine) 2/2 no growth -UC pseudomonas - switch to PO Cipro  3. Malignant HTN resume BP  meds -IV prn metoprolol -cont lisinopril,amlodipine and add metoprolol 25 mg bid  4. Acute renal failure likely related to ATN and sepsis  -IV fluids -creat  1.78--->0.74 d/ IVF -resolved -good uop  5. Abnormal troponin secondary to severe sepsis and renal failure - appreciate cardiology input -echo showed EF 75% (hyperdynamic)  6.Chronic  Decubitus ulcer continue current supportive care wound care consult done  7. Recurrent Urinary tract infection due to indwelling Foley catheter  UC GNR -pt on zosyn -recently completed course of keflex  8. 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 Hyperglycemia no h/o DM - could be due to tube feeds while being on the vent -on SSI  A1c is 5.6%  10. Speech therapy eval noted. No signs of oral aspiration  Resume diet. Pt needs assistance with feeding  Possible D/C tomorrow depending on clinical condition  CODE STATUS:full DVT Prophylaxis: lovenox  TOTAL  TIME TAKING CARE OF THIS PATIENT:25 minutes.  >50% time spent on counselling and coordination of care   Note: This dictation was prepared with Dragon dictation along with smaller phrase technology. Any transcriptional errors that result from this process are unintentional.  [facility], Laryssa Hassing M.D on 02/17/2015 at 10:12 PM  Between 7am to 6pm - Pager - [phone]  After 6pm go to [email or web address] - password [email or web address]  [place] [facility]  Office  [phone]  CC: Primary care physician; Kerby Nora, MD

## 2015-02-17 NOTE — Care Management Important Message (Signed)
Important Message  Patient Details  Name: Travis Cantrell MRN: [id number] Date of Birth: Aug 08, 1960   Medicare Important Message Given:  Yes    Olegario Messier A Aundrea Horace 02/17/2015, 10:12 AM

## 2015-02-17 NOTE — Progress Notes (Signed)
Electrolyte CONSULT NOTE - INITIAL   Pharmacy Consult for potassium supplementation Indication: hypokalemia  Allergies  Allergen Reactions  . Baclofen Diarrhea    Patient Measurements: Height: 6' (182.9 cm) Weight: 191 lb 8 oz (86.864 kg) IBW/kg (Calculated) : 77.6  Vital Signs: Temp: 97.8 F (36.6 C) (01/24 1327) Temp Source: Oral (01/24 1327) BP: 145/88 mmHg (01/24 1327) Pulse Rate: 75 (01/24 1327) Intake/Output from previous day: 01/23 0701 - 01/24 0700 In: 1497 [I.V.:1147; IV Piggyback:350] Out: 2050 [Urine:2050] Intake/Output from this shift: Total I/O In: 240 [P.O.:240] Out: 650 [Urine:650]  Labs:  Recent Labs  02/16/15 0445 02/17/15 1200  CREATININE 0.77 0.86  ALBUMIN  --  2.6*  PROT  --  6.3*  AST  --  12*  ALT  --  13*  ALKPHOS  --  50  BILITOT  --  0.5   Estimated Creatinine Clearance: 107.8 mL/min (by C-G formula based on Cr of 0.86).   Microbiology: Recent Results (from the past 720 hour(s))  Blood culture (routine x 2)     Status: None   Collection Time: 01/29/15  5:30 AM  Result Value Ref Range Status   Specimen Description BLOOD LEFT AC  Final   Special Requests   Final    BOTTLES DRAWN AEROBIC AND ANAEROBIC ANAEROBIC AEROBIC   Culture NO GROWTH 5 DAYS  Final   Report Status 02/03/2015 FINAL  Final  Blood culture (routine x 2)     Status: None   Collection Time: 01/29/15  5:40 AM  Result Value Ref Range Status   Specimen Description BLOOD RIGHT AC  Final   Special Requests   Final    BOTTLES DRAWN AEROBIC AND ANAEROBIC ANAEROBIC AEROBIC   Culture NO GROWTH 5 DAYS  Final   Report Status 02/03/2015 FINAL  Final  Urine culture     Status: None   Collection Time: 01/29/15  5:43 AM  Result Value Ref Range Status   Specimen Description URINE, CATHETERIZED  Final   Special Requests NONE  Final   Culture >=100,000 COLONIES/mL PROTEUS MIRABILIS  Final   Report Status 01/31/2015 FINAL  Final   Organism ID, Bacteria  PROTEUS MIRABILIS  Final      Susceptibility   Proteus mirabilis - MIC*    AMPICILLIN 16 INTERMEDIATE Intermediate     CEFTAZIDIME <=1 SENSITIVE Sensitive     CEFAZOLIN 8 SENSITIVE Sensitive     CEFTRIAXONE <=1 SENSITIVE Sensitive     CIPROFLOXACIN >=4 RESISTANT Resistant     GENTAMICIN <=1 SENSITIVE Sensitive     IMIPENEM >=16 RESISTANT Resistant     TRIMETH/SULFA 160 RESISTANT Resistant     PIP/TAZO Value in next row Sensitive      SENSITIVE<=4    * >=100,000 COLONIES/mL PROTEUS MIRABILIS  MRSA PCR Screening     Status: None   Collection Time: 01/29/15 12:50 PM  Result Value Ref Range Status   MRSA by PCR NEGATIVE NEGATIVE Final    Comment:        The GeneXpert MRSA Assay (FDA approved for NASAL specimens only), is one component of a comprehensive MRSA colonization surveillance program. It is not intended to diagnose MRSA infection nor to guide or monitor treatment for MRSA infections.   Blood Culture (routine x 2)     Status: None   Collection Time: 02/12/15  1:04 AM  Result Value Ref Range Status   Specimen Description BLOOD RIGHT HAND  Final   Special Requests   Final  BOTTLES DRAWN AEROBIC AND ANAEROBIC 15MLANEROBIC,10MLAEROBIC   Culture  Setup Time   Final    GRAM POSITIVE COCCI IN BOTH AEROBIC AND ANAEROBIC BOTTLES CRITICAL RESULT CALLED TO, READ BACK BY AND VERIFIED WITH: NATE COOKSON PHARMACIST AT 2203 02/12/15 SDR Organism ID to follow    Culture   Final    Two different species of coagulase negative Staphylococcus isolated. IN BOTH AEROBIC AND ANAEROBIC BOTTLES Results consistent with contamination.    Report Status 02/15/2015 FINAL  Final  Blood Culture (routine x 2)     Status: None   Collection Time: 02/12/15  1:04 AM  Result Value Ref Range Status   Specimen Description BLOOD LEFT HAND  Final   Special Requests   Final    BOTTLES DRAWN AEROBIC AND ANAEROBIC 10AEROBIC,16MLANAEROBIC   Culture NO GROWTH 5 DAYS  Final   Report Status 02/17/2015 FINAL   Final  Blood Culture ID Panel (Reflexed)     Status: Abnormal   Collection Time: 02/12/15  1:04 AM  Result Value Ref Range Status   Enterococcus species NOT DETECTED NOT DETECTED Final   Listeria monocytogenes NOT DETECTED NOT DETECTED Final   Staphylococcus species DETECTED (A) NOT DETECTED Final    Comment: CRITICAL RESULT CALLED TO, READ BACK BY AND VERIFIED WITH: NATE COOKSON AT 2203 02/12/15 SDR    Staphylococcus aureus NOT DETECTED NOT DETECTED Final   Streptococcus species NOT DETECTED NOT DETECTED Final   Streptococcus agalactiae NOT DETECTED NOT DETECTED Final   Streptococcus pneumoniae NOT DETECTED NOT DETECTED Final   Streptococcus pyogenes NOT DETECTED NOT DETECTED Final   Acinetobacter baumannii NOT DETECTED NOT DETECTED Final   Enterobacteriaceae species NOT DETECTED NOT DETECTED Final   Enterobacter cloacae complex NOT DETECTED NOT DETECTED Final   Escherichia coli NOT DETECTED NOT DETECTED Final   Klebsiella oxytoca NOT DETECTED NOT DETECTED Final   Klebsiella pneumoniae NOT DETECTED NOT DETECTED Final   Proteus species NOT DETECTED NOT DETECTED Final   Serratia marcescens NOT DETECTED NOT DETECTED Final   Haemophilus influenzae NOT DETECTED NOT DETECTED Final   Neisseria meningitidis NOT DETECTED NOT DETECTED Final   Pseudomonas aeruginosa NOT DETECTED NOT DETECTED Final   Candida albicans NOT DETECTED NOT DETECTED Final   Candida glabrata NOT DETECTED NOT DETECTED Final   Candida krusei NOT DETECTED NOT DETECTED Final   Candida parapsilosis NOT DETECTED NOT DETECTED Final   Candida tropicalis NOT DETECTED NOT DETECTED Final   Carbapenem resistance NOT DETECTED NOT DETECTED Final   Methicillin resistance DETECTED (A) NOT DETECTED Final    Comment: CRITICAL RESULT CALLED TO, READ BACK BY AND VERIFIED WITH: NATE COOKSON AT 2203 02/12/15 SDR    Vancomycin resistance NOT DETECTED NOT DETECTED Final  Urine culture     Status: None   Collection Time: 02/12/15  2:18 AM   Result Value Ref Range Status   Specimen Description URINE, RANDOM  Final   Special Requests NONE  Final   Culture >=100,000 COLONIES/mL PSEUDOMONAS AERUGINOSA  Final   Report Status 02/15/2015 FINAL  Final   Organism ID, Bacteria PSEUDOMONAS AERUGINOSA  Final      Susceptibility   Pseudomonas aeruginosa - MIC*    CEFTAZIDIME 8 SENSITIVE Sensitive     CIPROFLOXACIN 0.5 SENSITIVE Sensitive     GENTAMICIN <=1 SENSITIVE Sensitive     IMIPENEM 2 SENSITIVE Sensitive     PIP/TAZO Value in next row Sensitive      SENSITIVE32    * >=100,000 COLONIES/mL PSEUDOMONAS AERUGINOSA  MRSA  PCR Screening     Status: None   Collection Time: 02/12/15  5:18 AM  Result Value Ref Range Status   MRSA by PCR NEGATIVE NEGATIVE Final    Comment:        The GeneXpert MRSA Assay (FDA approved for NASAL specimens only), is one component of a comprehensive MRSA colonization surveillance program. It is not intended to diagnose MRSA infection nor to guide or monitor treatment for MRSA infections.   Culture, respiratory (NON-Expectorated)     Status: None   Collection Time: 02/12/15  9:24 AM  Result Value Ref Range Status   Specimen Description TRACHEAL ASPIRATE  Final   Special Requests NONE  Final   Gram Stain   Final    MODERATE WBC SEEN RARE GRAM POSITIVE COCCI GOOD SPECIMEN - 80-90% WBCS    Culture Consistent with normal respiratory flora.  Final   Report Status 02/14/2015 FINAL  Final  Culture, expectorated sputum-assessment     Status: None   Collection Time: 02/12/15  9:24 AM  Result Value Ref Range Status   Sputum evaluation DUPLICATE REQUEST CREDITED  Final   Report Status 02/13/2015 FINAL  Final    Medical History: Past Medical History  Diagnosis Date  . Allergy   . GERD (gastroesophageal reflux disease)   . MS (multiple sclerosis) (HCC)   . Hypertension   . Decubitus ulcer     Medications:  Scheduled:  . antiseptic oral rinse  7 mL Mouth Rinse q12n4p  . chlorhexidine  15 mL  Mouth Rinse BID  . ciprofloxacin  500 mg Oral BID  . metoprolol  5 mg Intravenous 4 times per day  . pantoprazole (PROTONIX) IV  40 mg Intravenous Q24H  . potassium chloride  40 mEq Oral Once  . senna-docusate  1 tablet Oral BID    Assessment: Pharmacy consulted for potassium supplementation in a 55 yo male admitted with respiratory failure.   K: 3.3, Mag pending  Goal of Therapy:  K: 3.5-5.1   Plan:  Will order potassium chloride 40 meq packet x 1. Magnesium level pending.  Will recheck potassium and magnesium in AM.  Karron Alvizo G 02/17/2015,3:59 PM

## 2015-02-17 NOTE — Progress Notes (Signed)
PT Cancellation Note  Patient Details Name: Travis Cantrell MRN: [id number] DOB: 1960-11-16   Cancelled Treatment:    Reason Eval/Treat Not Completed: PT screened, no needs identified, will sign off. Chart reviewed. SLP and care manager consulted. Medical record reviewed. Pt is dependent care at baseline. Per care management no PT needs. Order will be completed at this time. Please re-consult if PT needs identified.  Travis Cantrell PT, DPT   [name] 02/17/2015, 4:01 PM

## 2015-02-17 NOTE — Care Management (Signed)
Admitted to [facility] with the diagnosis of respiratory failure. Discharged from this facility 02/02/15. Friend/caregiver Salli Real is in the home 24/7. Sister is Annamarie Major [phone]) Last seen Dr. Karie Georges at [facility] about a month ago. Needs to be transported to doctor's appointments per paid transportation system. "Usually $75.00 a trip" per sister. Scooter, wheelchair, hoyer lift, and hospital bed in the home. No home oxygen. Followed by [facility]. [facility] 2-3 years ago. Caregiver helps with basic activities of daily living.Usually stays in bed, unless the day is pretty, then caregiver takes outside.  No falls. Usually a good appetite.  Transportation to home will need to be arranged through EMS per sister Gwenette Greet RN MSN CCM Care Management [phone]

## 2015-02-17 NOTE — Progress Notes (Signed)
Electrolyte CONSULT NOTE - INITIAL   Pharmacy Consult for potassium supplementation Indication: hypokalemia  Allergies  Allergen Reactions  . Baclofen Diarrhea    Patient Measurements: Height: 6' (182.9 cm) Weight: 191 lb 8 oz (86.864 kg) IBW/kg (Calculated) : 77.6  Vital Signs: Temp: 97.8 F (36.6 C) (01/24 1327) Temp Source: Oral (01/24 1327) BP: 145/88 mmHg (01/24 1327) Pulse Rate: 75 (01/24 1327) Intake/Output from previous day: 01/23 0701 - 01/24 0700 In: 1497 [I.V.:1147; IV Piggyback:350] Out: 2050 [Urine:2050] Intake/Output from this shift: Total I/O In: 240 [P.O.:240] Out: 650 [Urine:650]  Labs:  Recent Labs  02/16/15 0445 02/17/15 1200  CREATININE 0.77 0.86  MG  --  1.8  ALBUMIN  --  2.6*  PROT  --  6.3*  AST  --  12*  ALT  --  13*  ALKPHOS  --  50  BILITOT  --  0.5   Estimated Creatinine Clearance: 107.8 mL/min (by C-G formula based on Cr of 0.86).   Microbiology: Recent Results (from the past 720 hour(s))  Blood culture (routine x 2)     Status: None   Collection Time: 01/29/15  5:30 AM  Result Value Ref Range Status   Specimen Description BLOOD LEFT AC  Final   Special Requests   Final    BOTTLES DRAWN AEROBIC AND ANAEROBIC ANAEROBIC AEROBIC   Culture NO GROWTH 5 DAYS  Final   Report Status 02/03/2015 FINAL  Final  Blood culture (routine x 2)     Status: None   Collection Time: 01/29/15  5:40 AM  Result Value Ref Range Status   Specimen Description BLOOD RIGHT AC  Final   Special Requests   Final    BOTTLES DRAWN AEROBIC AND ANAEROBIC ANAEROBIC AEROBIC   Culture NO GROWTH 5 DAYS  Final   Report Status 02/03/2015 FINAL  Final  Urine culture     Status: None   Collection Time: 01/29/15  5:43 AM  Result Value Ref Range Status   Specimen Description URINE, CATHETERIZED  Final   Special Requests NONE  Final   Culture >=100,000 COLONIES/mL PROTEUS MIRABILIS  Final   Report Status 01/31/2015 FINAL  Final   Organism ID,  Bacteria PROTEUS MIRABILIS  Final      Susceptibility   Proteus mirabilis - MIC*    AMPICILLIN 16 INTERMEDIATE Intermediate     CEFTAZIDIME <=1 SENSITIVE Sensitive     CEFAZOLIN 8 SENSITIVE Sensitive     CEFTRIAXONE <=1 SENSITIVE Sensitive     CIPROFLOXACIN >=4 RESISTANT Resistant     GENTAMICIN <=1 SENSITIVE Sensitive     IMIPENEM >=16 RESISTANT Resistant     TRIMETH/SULFA 160 RESISTANT Resistant     PIP/TAZO Value in next row Sensitive      SENSITIVE<=4    * >=100,000 COLONIES/mL PROTEUS MIRABILIS  MRSA PCR Screening     Status: None   Collection Time: 01/29/15 12:50 PM  Result Value Ref Range Status   MRSA by PCR NEGATIVE NEGATIVE Final    Comment:        The GeneXpert MRSA Assay (FDA approved for NASAL specimens only), is one component of a comprehensive MRSA colonization surveillance program. It is not intended to diagnose MRSA infection nor to guide or monitor treatment for MRSA infections.   Blood Culture (routine x 2)     Status: None   Collection Time: 02/12/15  1:04 AM  Result Value Ref Range Status   Specimen Description BLOOD RIGHT HAND  Final  Special Requests   Final    BOTTLES DRAWN AEROBIC AND ANAEROBIC 15MLANEROBIC,10MLAEROBIC   Culture  Setup Time   Final    GRAM POSITIVE COCCI IN BOTH AEROBIC AND ANAEROBIC BOTTLES CRITICAL RESULT CALLED TO, READ BACK BY AND VERIFIED WITH: NATE COOKSON PHARMACIST AT 2203 02/12/15 SDR Organism ID to follow    Culture   Final    Two different species of coagulase negative Staphylococcus isolated. IN BOTH AEROBIC AND ANAEROBIC BOTTLES Results consistent with contamination.    Report Status 02/15/2015 FINAL  Final  Blood Culture (routine x 2)     Status: None   Collection Time: 02/12/15  1:04 AM  Result Value Ref Range Status   Specimen Description BLOOD LEFT HAND  Final   Special Requests   Final    BOTTLES DRAWN AEROBIC AND ANAEROBIC 10AEROBIC,16MLANAEROBIC   Culture NO GROWTH 5 DAYS  Final   Report Status  02/17/2015 FINAL  Final  Blood Culture ID Panel (Reflexed)     Status: Abnormal   Collection Time: 02/12/15  1:04 AM  Result Value Ref Range Status   Enterococcus species NOT DETECTED NOT DETECTED Final   Listeria monocytogenes NOT DETECTED NOT DETECTED Final   Staphylococcus species DETECTED (A) NOT DETECTED Final    Comment: CRITICAL RESULT CALLED TO, READ BACK BY AND VERIFIED WITH: NATE COOKSON AT 2203 02/12/15 SDR    Staphylococcus aureus NOT DETECTED NOT DETECTED Final   Streptococcus species NOT DETECTED NOT DETECTED Final   Streptococcus agalactiae NOT DETECTED NOT DETECTED Final   Streptococcus pneumoniae NOT DETECTED NOT DETECTED Final   Streptococcus pyogenes NOT DETECTED NOT DETECTED Final   Acinetobacter baumannii NOT DETECTED NOT DETECTED Final   Enterobacteriaceae species NOT DETECTED NOT DETECTED Final   Enterobacter cloacae complex NOT DETECTED NOT DETECTED Final   Escherichia coli NOT DETECTED NOT DETECTED Final   Klebsiella oxytoca NOT DETECTED NOT DETECTED Final   Klebsiella pneumoniae NOT DETECTED NOT DETECTED Final   Proteus species NOT DETECTED NOT DETECTED Final   Serratia marcescens NOT DETECTED NOT DETECTED Final   Haemophilus influenzae NOT DETECTED NOT DETECTED Final   Neisseria meningitidis NOT DETECTED NOT DETECTED Final   Pseudomonas aeruginosa NOT DETECTED NOT DETECTED Final   Candida albicans NOT DETECTED NOT DETECTED Final   Candida glabrata NOT DETECTED NOT DETECTED Final   Candida krusei NOT DETECTED NOT DETECTED Final   Candida parapsilosis NOT DETECTED NOT DETECTED Final   Candida tropicalis NOT DETECTED NOT DETECTED Final   Carbapenem resistance NOT DETECTED NOT DETECTED Final   Methicillin resistance DETECTED (A) NOT DETECTED Final    Comment: CRITICAL RESULT CALLED TO, READ BACK BY AND VERIFIED WITH: NATE COOKSON AT 2203 02/12/15 SDR    Vancomycin resistance NOT DETECTED NOT DETECTED Final  Urine culture     Status: None   Collection Time:  02/12/15  2:18 AM  Result Value Ref Range Status   Specimen Description URINE, RANDOM  Final   Special Requests NONE  Final   Culture >=100,000 COLONIES/mL PSEUDOMONAS AERUGINOSA  Final   Report Status 02/15/2015 FINAL  Final   Organism ID, Bacteria PSEUDOMONAS AERUGINOSA  Final      Susceptibility   Pseudomonas aeruginosa - MIC*    CEFTAZIDIME 8 SENSITIVE Sensitive     CIPROFLOXACIN 0.5 SENSITIVE Sensitive     GENTAMICIN <=1 SENSITIVE Sensitive     IMIPENEM 2 SENSITIVE Sensitive     PIP/TAZO Value in next row Sensitive      SENSITIVE32    * >=  100,000 COLONIES/mL PSEUDOMONAS AERUGINOSA  MRSA PCR Screening     Status: None   Collection Time: 02/12/15  5:18 AM  Result Value Ref Range Status   MRSA by PCR NEGATIVE NEGATIVE Final    Comment:        The GeneXpert MRSA Assay (FDA approved for NASAL specimens only), is one component of a comprehensive MRSA colonization surveillance program. It is not intended to diagnose MRSA infection nor to guide or monitor treatment for MRSA infections.   Culture, respiratory (NON-Expectorated)     Status: None   Collection Time: 02/12/15  9:24 AM  Result Value Ref Range Status   Specimen Description TRACHEAL ASPIRATE  Final   Special Requests NONE  Final   Gram Stain   Final    MODERATE WBC SEEN RARE GRAM POSITIVE COCCI GOOD SPECIMEN - 80-90% WBCS    Culture Consistent with normal respiratory flora.  Final   Report Status 02/14/2015 FINAL  Final  Culture, expectorated sputum-assessment     Status: None   Collection Time: 02/12/15  9:24 AM  Result Value Ref Range Status   Sputum evaluation DUPLICATE REQUEST CREDITED  Final   Report Status 02/13/2015 FINAL  Final    Medical History: Past Medical History  Diagnosis Date  . Allergy   . GERD (gastroesophageal reflux disease)   . MS (multiple sclerosis) (HCC)   . Hypertension   . Decubitus ulcer     Medications:  Scheduled:  . amLODipine  5 mg Oral Daily  . antiseptic oral rinse   7 mL Mouth Rinse q12n4p  . chlorhexidine  15 mL Mouth Rinse BID  . ciprofloxacin  500 mg Oral BID  . metoprolol  5 mg Intravenous 4 times per day  . [START ON 02/18/2015] pantoprazole  40 mg Oral QAC breakfast  . potassium chloride  40 mEq Oral Once  . senna-docusate  1 tablet Oral BID    Assessment: Pharmacy consulted for potassium supplementation in a 55 yo male admitted with respiratory failure.   K: 3.3, Mg: 1.8  Goal of Therapy:  K: 3.5-5.1   Plan:  Magnesium level is wnl. Will f/u am labs.   Luisa Hart D 02/17/2015,4:51 PM

## 2015-02-17 NOTE — Care Management Important Message (Signed)
Important Message  Patient Details  Name: Travis Cantrell MRN: [id number] Date of Birth: 02/01/1960   Medicare Important Message Given:  Yes    [name] [name] 02/17/2015, 10:12 AM 
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
## 2015-02-17 NOTE — Progress Notes (Signed)
Speech Language Pathology Treatment: Dysphagia  Patient Details Name: CLIFFTON SWING MRN: [id number] DOB: May 26, 1960 Today's Date: 02/17/2015 Time: 5825-1898 SLP Time Calculation (min) (ACUTE ONLY): 44 min  Assessment / Plan / Recommendation Clinical Impression  Pt appeared to tolerate trials of puree but exhibited delayed, overt s/s of aspiration w/ trials of thin liquids. Rec. Continue w/ Nectar consistency liquids w/ increased puree foods in diet w/ strict aspiration precautions and feeding assistance at meals. Rec. meds in puree w/ NSG. ST will f/u w/ toleration of diet and trials to upgrade as appropriate.    HPI HPI: Pt is a 55 y.o. male with a known history of multiple sclerosis, hypertension, GERD, decubitus ulcer, Proteus mirabilis urinary tract infection presented to the emergency room with lethargy, confusion and difficulty breathing. Patient lives with roommates at home. They found him with decreased responsiveness and EMS was called. When EMS arrived at patient's house he was having labored breathing with decreased responsiveness. Patient was bagged by the EMS and brought to the emergency emergency room. In the emergency room patient was electively intubated and put on ventilator for respiratory failure and for hypoxia and and decreased O2 saturation. Also had a fever of 10 256F. Not much history could be (the patient. Agent was given etomidate and rocuronium prior to intubation. She was resuscitated with IV fluids, 4 L of fluids were given for resuscitation. Initially hypotensive but later on after fluid resuscitation was done blood pressure responded and improved to 110/85 mmHg. His fever came down to 10 56F. Patient was given IV vancomycin and IV Zosyn antibiotics in the emergency room. Patient has a chronic indwelling Foley catheter and cloudy urine was observed and was poorly maintained. Patient was recently discharged from our hospital on 02/01/2014 after being managed for urinary tract  infection and abdominal pain. Pt was extubated 02/15/15 and has tolerated such. He appears to still be slightly drowsy; NSG indicated such today. Pt was verbal w/ SLP and Sister in room answering few basic questions re: self. He appeared weak and was slow to respond. Min. Verbal and tactile cues were required during the evaluation to help pt maintain alertness. He has tolerated the po's as rec'd today during meals per NSG report.       SLP Plan  Continue with current plan of care     Recommendations  Diet recommendations: Dysphagia 1 (puree);Nectar-thick liquid Liquids provided via: Cup;Straw Medication Administration: Crushed with puree Supervision: Staff to assist with self feeding;Full supervision/cueing for compensatory strategies;Trained caregiver to feed patient Compensations: Minimize environmental distractions;Slow rate;Small sips/bites;Follow solids with liquid Postural Changes and/or Swallow Maneuvers: Seated upright 90 degrees             General recommendations: Rehab consult Oral Care Recommendations: Staff/trained caregiver to provide oral care;Oral care BID Follow up Recommendations: Skilled Nursing facility (TBD) Plan: Continue with current plan of care     GO               Jerilynn Som, MS, CCC-SLP  Ezma Rehm 02/17/2015, 4:35 PM

## 2015-02-18 LAB — BASIC METABOLIC PANEL
Anion gap: 7 (ref 5–15)
BUN: 12 mg/dL (ref 6–20)
CALCIUM: 8.4 mg/dL — AB (ref 8.9–10.3)
CHLORIDE: 106 mmol/L (ref 101–111)
CO2: 28 mmol/L (ref 22–32)
CREATININE: 0.88 mg/dL (ref 0.61–1.24)
GFR calc Af Amer: >60 mL/min (ref >60)
GFR calc non Af Amer: >60 mL/min (ref >60)
Glucose, Bld: 117 mg/dL — ABNORMAL HIGH (ref 65–99)
Potassium: 3.3 mmol/L — ABNORMAL LOW (ref 3.5–5.1)
SODIUM: 141 mmol/L (ref 135–145)

## 2015-02-18 LAB — MAGNESIUM: Magnesium: 1.8 mg/dL (ref 1.7–2.4)

## 2015-02-18 LAB — GLUCOSE, CAPILLARY
GLUCOSE-CAPILLARY: 110 mg/dL — AB (ref 65–99)
Glucose-Capillary: 145 mg/dL — ABNORMAL HIGH (ref 65–99)

## 2015-02-18 LAB — CBC
HEMATOCRIT: 31.9 % — AB (ref 40.0–52.0)
Hemoglobin: 10.3 g/dL — ABNORMAL LOW (ref 13.0–18.0)
MCH: 25.7 pg — ABNORMAL LOW (ref 26.0–34.0)
MCHC: 32.4 g/dL (ref 32.0–36.0)
MCV: 79.2 fL — ABNORMAL LOW (ref 80.0–100.0)
PLATELETS: 332 10*3/uL (ref 150–440)
RBC: 4.03 MIL/uL — ABNORMAL LOW (ref 4.40–5.90)
RDW: 15.8 % — AB (ref 11.5–14.5)
WBC: 12.2 10*3/uL — AB (ref 3.8–10.6)

## 2015-02-18 MED ORDER — ALBUTEROL SULFATE (2.5 MG/3ML) 0.083% IN NEBU: 2.5000 mg | INHALATION_SOLUTION | RESPIRATORY_TRACT | Status: DC | PRN

## 2015-02-18 MED ORDER — POTASSIUM CHLORIDE CRYS ER 20 MEQ PO TBCR
40.0000 meq | EXTENDED_RELEASE_TABLET | Freq: Once | ORAL | Status: AC
  Administered 2015-02-18: 08:00:00 40 meq via ORAL
  Filled 2015-02-18: qty 2

## 2015-02-18 MED ORDER — CIPROFLOXACIN HCL 500 MG PO TABS: 500.0000 mg | ORAL_TABLET | Freq: Two times a day (BID) | ORAL | Status: DC

## 2015-02-18 NOTE — Progress Notes (Signed)
Pt has IV metoprolol scheduled.  Nurses cannot push on this floor.  Dr. Luberta Mutter made aware, states she will look at it.  Orson Ape, RN

## 2015-02-18 NOTE — Progress Notes (Signed)
Discharge instructions and medications discussed with pt.  Pt unable to sigh discharge papers due to MS.  PICC and IV removed.  Copy of discharge instructions given to EMS to give to caregiver when pt arrives at home.  Sister called to verify caregiver at home for EMS.  Pt transported via EMS. Orson Ape, RN

## 2015-02-18 NOTE — Progress Notes (Signed)
Electrolyte CONSULT NOTE - Follow Up  Pharmacy Consult for potassium supplementation Indication: hypokalemia  Allergies  Allergen Reactions  . Baclofen Diarrhea    Patient Measurements: Height: 6' (182.9 cm) Weight: 194 lb 1.6 oz (88.043 kg) IBW/kg (Calculated) : 77.6  Vital Signs: Temp: 99.5 F (37.5 C) (01/25 0436) Temp Source: Oral (01/25 0436) BP: 154/80 mmHg (01/25 0601) Pulse Rate: 70 (01/25 0601) Intake/Output from previous day: 01/24 0701 - 01/25 0700 In: 240 [P.O.:240] Out: 1650 [Urine:1650] Intake/Output from this shift:    Labs:  Recent Labs  02/16/15 0445 02/17/15 1200 02/18/15 0522  WBC  --   --  12.2*  HGB  --   --  10.3*  HCT  --   --  31.9*  PLT  --   --  332  CREATININE 0.77 0.86 0.88  MG  --  1.8 1.8  ALBUMIN  --  2.6*  --   PROT  --  6.3*  --   AST  --  12*  --   ALT  --  13*  --   ALKPHOS  --  50  --   BILITOT  --  0.5  --    Estimated Creatinine Clearance: 105.3 mL/min (by C-Cantrell formula based on Cr of 0.88).   Microbiology: Recent Results (from the past 720 hour(s))  Blood culture (routine x 2)     Status: None   Collection Time: 01/29/15  5:30 AM  Result Value Ref Range Status   Specimen Description BLOOD LEFT AC  Final   Special Requests   Final    BOTTLES DRAWN AEROBIC AND ANAEROBIC ANAEROBIC AEROBIC   Culture NO GROWTH 5 DAYS  Final   Report Status 02/03/2015 FINAL  Final  Blood culture (routine x 2)     Status: None   Collection Time: 01/29/15  5:40 AM  Result Value Ref Range Status   Specimen Description BLOOD RIGHT AC  Final   Special Requests   Final    BOTTLES DRAWN AEROBIC AND ANAEROBIC ANAEROBIC AEROBIC   Culture NO GROWTH 5 DAYS  Final   Report Status 02/03/2015 FINAL  Final  Urine culture     Status: None   Collection Time: 01/29/15  5:43 AM  Result Value Ref Range Status   Specimen Description URINE, CATHETERIZED  Final   Special Requests NONE  Final   Culture >=100,000 COLONIES/mL PROTEUS  MIRABILIS  Final   Report Status 01/31/2015 FINAL  Final   Organism ID, Bacteria PROTEUS MIRABILIS  Final      Susceptibility   Proteus mirabilis - MIC*    AMPICILLIN 16 INTERMEDIATE Intermediate     CEFTAZIDIME <=1 SENSITIVE Sensitive     CEFAZOLIN 8 SENSITIVE Sensitive     CEFTRIAXONE <=1 SENSITIVE Sensitive     CIPROFLOXACIN >=4 RESISTANT Resistant     GENTAMICIN <=1 SENSITIVE Sensitive     IMIPENEM >=16 RESISTANT Resistant     TRIMETH/SULFA 160 RESISTANT Resistant     PIP/TAZO Value in next row Sensitive      SENSITIVE<=4    * >=100,000 COLONIES/mL PROTEUS MIRABILIS  MRSA PCR Screening     Status: None   Collection Time: 01/29/15 12:50 PM  Result Value Ref Range Status   MRSA by PCR NEGATIVE NEGATIVE Final    Comment:        The GeneXpert MRSA Assay (FDA approved for NASAL specimens only), is one component of a comprehensive MRSA colonization surveillance program. It is not intended  to diagnose MRSA infection nor to guide or monitor treatment for MRSA infections.   Blood Culture (routine x 2)     Status: None   Collection Time: 02/12/15  1:04 AM  Result Value Ref Range Status   Specimen Description BLOOD RIGHT HAND  Final   Special Requests   Final    BOTTLES DRAWN AEROBIC AND ANAEROBIC 15MLANEROBIC,10MLAEROBIC   Culture  Setup Time   Final    GRAM POSITIVE COCCI IN BOTH AEROBIC AND ANAEROBIC BOTTLES CRITICAL RESULT CALLED TO, READ BACK BY AND VERIFIED WITH: NATE COOKSON PHARMACIST AT 2203 02/12/15 SDR Organism ID to follow    Culture   Final    Two different species of coagulase negative Staphylococcus isolated. IN BOTH AEROBIC AND ANAEROBIC BOTTLES Results consistent with contamination.    Report Status 02/15/2015 FINAL  Final  Blood Culture (routine x 2)     Status: None   Collection Time: 02/12/15  1:04 AM  Result Value Ref Range Status   Specimen Description BLOOD LEFT HAND  Final   Special Requests   Final    BOTTLES DRAWN AEROBIC AND ANAEROBIC  10AEROBIC,16MLANAEROBIC   Culture NO GROWTH 5 DAYS  Final   Report Status 02/17/2015 FINAL  Final  Blood Culture ID Panel (Reflexed)     Status: Abnormal   Collection Time: 02/12/15  1:04 AM  Result Value Ref Range Status   Enterococcus species NOT DETECTED NOT DETECTED Final   Listeria monocytogenes NOT DETECTED NOT DETECTED Final   Staphylococcus species DETECTED (A) NOT DETECTED Final    Comment: CRITICAL RESULT CALLED TO, READ BACK BY AND VERIFIED WITH: NATE COOKSON AT 2203 02/12/15 SDR    Staphylococcus aureus NOT DETECTED NOT DETECTED Final   Streptococcus species NOT DETECTED NOT DETECTED Final   Streptococcus agalactiae NOT DETECTED NOT DETECTED Final   Streptococcus pneumoniae NOT DETECTED NOT DETECTED Final   Streptococcus pyogenes NOT DETECTED NOT DETECTED Final   Acinetobacter baumannii NOT DETECTED NOT DETECTED Final   Enterobacteriaceae species NOT DETECTED NOT DETECTED Final   Enterobacter cloacae complex NOT DETECTED NOT DETECTED Final   Escherichia coli NOT DETECTED NOT DETECTED Final   Klebsiella oxytoca NOT DETECTED NOT DETECTED Final   Klebsiella pneumoniae NOT DETECTED NOT DETECTED Final   Proteus species NOT DETECTED NOT DETECTED Final   Serratia marcescens NOT DETECTED NOT DETECTED Final   Haemophilus influenzae NOT DETECTED NOT DETECTED Final   Neisseria meningitidis NOT DETECTED NOT DETECTED Final   Pseudomonas aeruginosa NOT DETECTED NOT DETECTED Final   Candida albicans NOT DETECTED NOT DETECTED Final   Candida glabrata NOT DETECTED NOT DETECTED Final   Candida krusei NOT DETECTED NOT DETECTED Final   Candida parapsilosis NOT DETECTED NOT DETECTED Final   Candida tropicalis NOT DETECTED NOT DETECTED Final   Carbapenem resistance NOT DETECTED NOT DETECTED Final   Methicillin resistance DETECTED (A) NOT DETECTED Final    Comment: CRITICAL RESULT CALLED TO, READ BACK BY AND VERIFIED WITH: NATE COOKSON AT 2203 02/12/15 SDR    Vancomycin resistance NOT  DETECTED NOT DETECTED Final  Urine culture     Status: None   Collection Time: 02/12/15  2:18 AM  Result Value Ref Range Status   Specimen Description URINE, RANDOM  Final   Special Requests NONE  Final   Culture >=100,000 COLONIES/mL PSEUDOMONAS AERUGINOSA  Final   Report Status 02/15/2015 FINAL  Final   Organism ID, Bacteria PSEUDOMONAS AERUGINOSA  Final      Susceptibility   Pseudomonas aeruginosa -  MIC*    CEFTAZIDIME 8 SENSITIVE Sensitive     CIPROFLOXACIN 0.5 SENSITIVE Sensitive     GENTAMICIN <=1 SENSITIVE Sensitive     IMIPENEM 2 SENSITIVE Sensitive     PIP/TAZO Value in next row Sensitive      SENSITIVE32    * >=100,000 COLONIES/mL PSEUDOMONAS AERUGINOSA  MRSA PCR Screening     Status: None   Collection Time: 02/12/15  5:18 AM  Result Value Ref Range Status   MRSA by PCR NEGATIVE NEGATIVE Final    Comment:        The GeneXpert MRSA Assay (FDA approved for NASAL specimens only), is one component of a comprehensive MRSA colonization surveillance program. It is not intended to diagnose MRSA infection nor to guide or monitor treatment for MRSA infections.   Culture, respiratory (NON-Expectorated)     Status: None   Collection Time: 02/12/15  9:24 AM  Result Value Ref Range Status   Specimen Description TRACHEAL ASPIRATE  Final   Special Requests NONE  Final   Gram Stain   Final    MODERATE WBC SEEN RARE GRAM POSITIVE COCCI GOOD SPECIMEN - 80-90% WBCS    Culture Consistent with normal respiratory flora.  Final   Report Status 02/14/2015 FINAL  Final  Culture, expectorated sputum-assessment     Status: None   Collection Time: 02/12/15  9:24 AM  Result Value Ref Range Status   Sputum evaluation DUPLICATE REQUEST CREDITED  Final   Report Status 02/13/2015 FINAL  Final    Medical History: Past Medical History  Diagnosis Date  . Allergy   . GERD (gastroesophageal reflux disease)   . MS (multiple sclerosis) (HCC)   . Hypertension   . Decubitus ulcer      Medications:  Scheduled:  . amLODipine  5 mg Oral Daily  . antiseptic oral rinse  7 mL Mouth Rinse q12n4p  . chlorhexidine  15 mL Mouth Rinse BID  . ciprofloxacin  500 mg Oral BID  . metoprolol  5 mg Intravenous 4 times per day  . pantoprazole  40 mg Oral QAC breakfast  . potassium chloride  40 mEq Oral Once  . senna-docusate  1 tablet Oral BID    Assessment: Pharmacy consulted for potassium supplementation in a 55 yo male admitted with respiratory failure.   K: 3.3, Mg: 1.8  Goal of Therapy:  K: 3.5-5.1   Plan:  Magnesium level is wnl. Will supplement potassium with KCl 40 meq po once and recheck labs in AM.   Travis Cantrell 02/18/2015,7:46 AM

## 2015-02-18 NOTE — Discharge Summary (Signed)
Travis Cantrell, is a 55 y.o. male  DOB [birth year]  MRN [id number].  Admission date:  02/12/2015  Admitting Physician  Ihor Austin, MD  Discharge Date:  02/18/2015   Primary MD  Kerby Nora, MD  Recommendations for primary care physician for things to follow:  Follow-up with primary doctor in 1 week.   Admission Diagnosis  Respiratory failure (HCC) [J96.90] Personal history of multiple sclerosis [Z86.69] Elevated troponin I level [R79.89] Severe sepsis (HCC) [A41.9, R65.20] Urinary tract infection associated with catheterization of urinary tract, initial encounter [T83.51XA, N39.0] Acute respiratory failure, unspecified whether with hypoxia or hypercapnia (HCC) [J96.00] Acute renal failure, unspecified acute renal failure type (HCC) [N17.9]   Discharge Diagnosis  Respiratory failure (HCC) [J96.90] Personal history of multiple sclerosis [Z86.69] Elevated troponin I level [R79.89] Severe sepsis (HCC) [A41.9, R65.20] Urinary tract infection associated with catheterization of urinary tract, initial encounter [T83.51XA, N39.0] Acute respiratory failure, unspecified whether with hypoxia or hypercapnia (HCC) [J96.00] Acute renal failure, unspecified acute renal failure type (HCC) [N17.9]    Principal Problem:   Respiratory failure (HCC)      Past Medical History  Diagnosis Date  . Allergy   . GERD (gastroesophageal reflux disease)   . MS (multiple sclerosis) (HCC)   . Hypertension   . Decubitus ulcer     Past Surgical History  Procedure Laterality Date  . Vein reconsruction    . Rotator cuff repair         History of present illness and  Hospital Course:     Kindly see H&P for history of present illness and admission details, please review complete Labs, Consult reports and Test reports for all details in  brief  HPI  from the history and physical done on the day of admission 55 year old male with history of multiple sclerosis, paraplegia admitted severe sepsis secondary to aspiration pneumonia, UTI. Patient had respiratory failure, he was intubated and admitted to ICU.   Hospital Course   #1 severe sepsis secondary to pneumonia, UTI: Admitted to ICU for respiratory failure and sepsis, started on IV fluids, IV vancomycin, Zosyn. Continued on mechanical ventilator. Follow by pulmonary and critical care. Patient was extubated on January 21. After extubation patient respiratory status remained stable. Patient's WBC on admission 28.6. Today it is 12.2. Lactic acid on admission 3.8 which decreased to 3). Patient urine culture shows Pseudomonas aeruginosa sensitive to Cipro. Patient blood cultures initially showed staph but the final results are negative for 5 days. Plan is to discharge him home with Cipro 500 mg twice a day for 7 days for UTI.  #2 acute respiratory failure secondary to severe sepsis: Status post  intubation, extubation. Patient oxygen saturation today is 96% on 2 L. We will wean off oxygen.  #3 acute renal failure secondary to sepsis: Improved with IV hydration. Acute renal failure due to ATN due to sepsis: Patient kidney function improved with IV hydration. Initial creatinine is 1.78 BUN 30. Today days BUN 12 creatinine 0.88. #4 pulmonary vascular congestion likely secondary to fluids that he received for sepsis. Echo showed EF 75%. #5 . Malignant hypertension: Blood pressure improved. Patient received IV when necessary metoprolol. Patient can continue amlodipine, metoprolol, lisinopril. Because of recent recovery from acute renal failure I stopped  ibuprofen. By 6 abnormal troponin secondary to severe sepsis and renal failure: Seen by cardiology. Echo is done EF is 75%. #7. 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 #8 .multiple  sclerosis  has paraplegia and he is bedbound. #9 hypokalemia replaced.    Discharge Condition: Stable   Follow UP; follow-up with primary doctor in 1 week      Discharge Instructions  and  Discharge Medications   [facility] Day dysphagia 1 plaque. Diet with nectar thick liquids and Continue ensure enlive.     Medication List    STOP taking these medications        cephALEXin 500 MG capsule  Commonly known as:  KEFLEX     ibuprofen 800 MG tablet  Commonly known as:  ADVIL,MOTRIN      TAKE these medications        acetaminophen 325 MG tablet  Commonly known as:  TYLENOL  Take 2 tablets (650 mg total) by mouth every 6 (six) hours as needed for mild pain (or Fever >/= 101).     albuterol (2.5 MG/3ML) 0.083% nebulizer solution  Commonly known as:  PROVENTIL  Take 3 mLs (2.5 mg total) by nebulization every 4 (four) hours as needed for wheezing or shortness of breath.     amLODipine 5 MG tablet  Commonly known as:  NORVASC  Take 1 tablet (5 mg total) by mouth daily.     atorvastatin 40 MG tablet  Commonly known as:  LIPITOR  Take 1 tablet (40 mg total) by mouth daily.     ciprofloxacin 500 MG tablet  Commonly known as:  CIPRO  Take 1 tablet (500 mg total) by mouth 2 (two) times daily.     diazepam 5 MG tablet  Commonly known as:  VALIUM  TAKE ONE TABLET BY MOUTH EVERY 12 HOURS     docusate sodium 100 MG capsule  Commonly known as:  COLACE  Take 1 capsule (100 mg total) by mouth 2 (two) times daily.     feeding supplement (ENSURE ENLIVE) Liqd  Take 237 mLs by mouth 2 (two) times daily with a meal.     FLUoxetine 20 MG capsule  Commonly known as:  PROZAC  TAKE TWO CAPSULES BY MOUTH IN THE MORNING AND ONE IN THE EVENING     gabapentin 300 MG capsule  Commonly known as:  NEURONTIN  Take 3 capsules (900 mg total) by mouth 3 (three) times daily.     lisinopril 10 MG tablet  Commonly known as:  PRINIVIL,ZESTRIL  TAKE ONE  TABLET BY MOUTH ONCE DAILY     metoprolol succinate 25 MG 24 hr tablet  Commonly known as:  TOPROL-XL  Take 25 mg by mouth daily.     omeprazole 20 MG capsule  Commonly known as:  PRILOSEC  TAKE ONE CAPSULE BY MOUTH TWICE DAILY     oxybutynin 5 MG tablet  Commonly known as:  DITROPAN  Take 1 tablet (5 mg total) by mouth every 8 (eight) hours as needed for bladder spasms.     saccharomyces boulardii 250 MG capsule  Commonly known as:  FLORASTOR  Take 1 capsule (250 mg total) by mouth 2 (two) times daily.     VITAMIN D-3 PO  Take 1 tablet by mouth daily.          Diet and Activity recommendation: See Discharge Instructions above   Consults obtained - pulmonary and critical care Speech therapy Physical therapy Wound care Cardiology   Major procedures and Radiology Reports - PLEASE review detailed and final reports for all details, in brief -      Dg Chest 1 View  02/14/2015  CLINICAL  DATA:  CC you patient, dyspnea. EXAM: CHEST 1 VIEW COMPARISON:  Chest x-ray dated 02/13/2015 and 02/12/2015. FINDINGS: Mild cardiomegaly is stable. Overall cardiomediastinal silhouette is stable in size and configuration. Again noted is central pulmonary vascular congestion and mild bilateral interstitial edema. No new lung findings. No large pleural effusion. No pneumothorax seen. Endotracheal tube remains well positioned with tip approximately 2 cm above the carina. Right IJ central line adequately positioned with tip overlying the upper SVC. Enteric tube passes below the diaphragm. IMPRESSION: 1. Mild cardiomegaly with central pulmonary vascular congestion and mild bilateral interstitial edema compatible with continued volume overload/CHF. 2. No new lung findings. 3. Tubes and lines remain adequately positioned. Electronically Signed   By: Bary Richard M.D.   On: 02/14/2015 07:47   Dg Chest 1 View  02/13/2015  CLINICAL DATA:  Shortness of breath. EXAM: CHEST 1 VIEW COMPARISON:  02/12/2015 .  FINDINGS: Endotracheal tube, NG tube, right IJ line in stable position. Cardiomegaly with pulmonary vascular prominence and bilateral interstitial prominence consistent mild congestive heart failure. No pleural effusion or pneumothorax. IMPRESSION: 1. Lines and tubes stable position. 2. Cardiomegaly with bilateral pulmonary interstitial prominence consistent with congestive heart failure with pulmonary interstitial edema. Findings have progressed from prior exam. Electronically Signed   By: Maisie Fus  Register   On: 02/13/2015 07:47   Dg Abd 1 View  02/12/2015  CLINICAL DATA:  55 year old male with enteric tube placement. Check for tube position. EXAM: ABDOMEN - 1 VIEW COMPARISON:  Abdominal CT dated 03/03/2004 FINDINGS: An enteric tube is partially visualized with tip in the upper abdomen and to the right of the superior endplate of the L1 vertebra. No bowel dilatation identified. A 1.3 cm radiopaque density over the right renal silhouette may represent a kidney stone. There is mild haziness of the left lung base which may represent atelectatic changes. Pneumonia is not excluded. IMPRESSION: Enteric tube the tip in the upper abdomen.  No bowel dilatation. Probable right renal calculus. Electronically Signed   By: Elgie Collard M.D.   On: 02/12/2015 06:49   Dg Chest Portable 1 View  02/12/2015  CLINICAL DATA:  Central line placement.  Initial encounter. EXAM: PORTABLE CHEST 1 VIEW COMPARISON:  Chest radiograph performed earlier today at 1:14 a.m. FINDINGS: The patient's endotracheal tube is seen ending 3 cm above the carina. A right IJ line is noted ending about the mid SVC. A small left pleural effusion is noted, apparently new from the prior study. Bilateral central airspace opacities could reflect mild interstitial edema. No pneumothorax is seen. The cardiomediastinal silhouette is borderline normal in size. No acute osseous abnormalities are identified. IMPRESSION: 1. Endotracheal tube seen ending 3 cm  above the carina. 2. Right IJ line noted ending about the mid SVC. 3. Small left pleural effusion, apparently new from the prior study. Bilateral central airspace opacification could reflect mild interstitial edema. Electronically Signed   By: Roanna Raider M.D.   On: 02/12/2015 02:34   Dg Chest Portable 1 View  02/12/2015  CLINICAL DATA:  55 year old male found unresponsive. Status post intubation. EXAM: PORTABLE CHEST 1 VIEW COMPARISON:  Radiograph dated 11/03/2014 FINDINGS: Endotracheal tube with tip approximately 4.5 cm above the carina. The lungs are clear. No pleural effusion or pneumothorax. The cardiac silhouette is within normal limits. The osseous structures appear unremarkable. IMPRESSION: Endotracheal tube above the carina. Electronically Signed   By: Elgie Collard M.D.   On: 02/12/2015 01:41    Micro Results    Recent Results (from  the past 240 hour(s))  Blood Culture (routine x 2)     Status: None   Collection Time: 02/12/15  1:04 AM  Result Value Ref Range Status   Specimen Description BLOOD RIGHT HAND  Final   Special Requests   Final    BOTTLES DRAWN AEROBIC AND ANAEROBIC 15MLANEROBIC,10MLAEROBIC   Culture  Setup Time   Final    GRAM POSITIVE COCCI IN BOTH AEROBIC AND ANAEROBIC BOTTLES CRITICAL RESULT CALLED TO, READ BACK BY AND VERIFIED WITH: NATE COOKSON PHARMACIST AT 2203 02/12/15 SDR Organism ID to follow    Culture   Final    Two different species of coagulase negative Staphylococcus isolated. IN BOTH AEROBIC AND ANAEROBIC BOTTLES Results consistent with contamination.    Report Status 02/15/2015 FINAL  Final  Blood Culture (routine x 2)     Status: None   Collection Time: 02/12/15  1:04 AM  Result Value Ref Range Status   Specimen Description BLOOD LEFT HAND  Final   Special Requests   Final    BOTTLES DRAWN AEROBIC AND ANAEROBIC 10AEROBIC,16MLANAEROBIC   Culture NO GROWTH 5 DAYS  Final   Report Status 02/17/2015 FINAL  Final  Blood Culture ID Panel  (Reflexed)     Status: Abnormal   Collection Time: 02/12/15  1:04 AM  Result Value Ref Range Status   Enterococcus species NOT DETECTED NOT DETECTED Final   Listeria monocytogenes NOT DETECTED NOT DETECTED Final   Staphylococcus species DETECTED (A) NOT DETECTED Final    Comment: CRITICAL RESULT CALLED TO, READ BACK BY AND VERIFIED WITH: NATE COOKSON AT 2203 02/12/15 SDR    Staphylococcus aureus NOT DETECTED NOT DETECTED Final   Streptococcus species NOT DETECTED NOT DETECTED Final   Streptococcus agalactiae NOT DETECTED NOT DETECTED Final   Streptococcus pneumoniae NOT DETECTED NOT DETECTED Final   Streptococcus pyogenes NOT DETECTED NOT DETECTED Final   Acinetobacter baumannii NOT DETECTED NOT DETECTED Final   Enterobacteriaceae species NOT DETECTED NOT DETECTED Final   Enterobacter cloacae complex NOT DETECTED NOT DETECTED Final   Escherichia coli NOT DETECTED NOT DETECTED Final   Klebsiella oxytoca NOT DETECTED NOT DETECTED Final   Klebsiella pneumoniae NOT DETECTED NOT DETECTED Final   Proteus species NOT DETECTED NOT DETECTED Final   Serratia marcescens NOT DETECTED NOT DETECTED Final   Haemophilus influenzae NOT DETECTED NOT DETECTED Final   Neisseria meningitidis NOT DETECTED NOT DETECTED Final   Pseudomonas aeruginosa NOT DETECTED NOT DETECTED Final   Candida albicans NOT DETECTED NOT DETECTED Final   Candida glabrata NOT DETECTED NOT DETECTED Final   Candida krusei NOT DETECTED NOT DETECTED Final   Candida parapsilosis NOT DETECTED NOT DETECTED Final   Candida tropicalis NOT DETECTED NOT DETECTED Final   Carbapenem resistance NOT DETECTED NOT DETECTED Final   Methicillin resistance DETECTED (A) NOT DETECTED Final    Comment: CRITICAL RESULT CALLED TO, READ BACK BY AND VERIFIED WITH: NATE COOKSON AT 2203 02/12/15 SDR    Vancomycin resistance NOT DETECTED NOT DETECTED Final  Urine culture     Status: None   Collection Time: 02/12/15  2:18 AM  Result Value Ref Range Status    Specimen Description URINE, RANDOM  Final   Special Requests NONE  Final   Culture >=100,000 COLONIES/mL PSEUDOMONAS AERUGINOSA  Final   Report Status 02/15/2015 FINAL  Final   Organism ID, Bacteria PSEUDOMONAS AERUGINOSA  Final      Susceptibility   Pseudomonas aeruginosa - MIC*    CEFTAZIDIME 8 SENSITIVE Sensitive  CIPROFLOXACIN 0.5 SENSITIVE Sensitive     GENTAMICIN <=1 SENSITIVE Sensitive     IMIPENEM 2 SENSITIVE Sensitive     PIP/TAZO Value in next row Sensitive      SENSITIVE32    * >=100,000 COLONIES/mL PSEUDOMONAS AERUGINOSA  MRSA PCR Screening     Status: None   Collection Time: 02/12/15  5:18 AM  Result Value Ref Range Status   MRSA by PCR NEGATIVE NEGATIVE Final    Comment:        The GeneXpert MRSA Assay (FDA approved for NASAL specimens only), is one component of a comprehensive MRSA colonization surveillance program. It is not intended to diagnose MRSA infection nor to guide or monitor treatment for MRSA infections.   Culture, respiratory (NON-Expectorated)     Status: None   Collection Time: 02/12/15  9:24 AM  Result Value Ref Range Status   Specimen Description TRACHEAL ASPIRATE  Final   Special Requests NONE  Final   Gram Stain   Final    MODERATE WBC SEEN RARE GRAM POSITIVE COCCI GOOD SPECIMEN - 80-90% WBCS    Culture Consistent with normal respiratory flora.  Final   Report Status 02/14/2015 FINAL  Final  Culture, expectorated sputum-assessment     Status: None   Collection Time: 02/12/15  9:24 AM  Result Value Ref Range Status   Sputum evaluation DUPLICATE REQUEST CREDITED  Final   Report Status 02/13/2015 FINAL  Final       Today   Subjective:   Champ Mungo today has no headache,no chest abdominal pain,no new weakness tingling or numbness, feels much better wants to go home today.  Objective:   Blood pressure 154/80, pulse 70, temperature 99.5 F (37.5 C), temperature source Oral, resp. rate 18, height 6' (1.829 m), weight 88.043  kg (194 lb 1.6 oz), SpO2 96 %.   Intake/Output Summary (Last 24 hours) at 02/18/15 0758 Last data filed at 02/18/15 0500  Gross per 24 hour  Intake    240 ml  Output   1650 ml  Net  -1410 ml    Exam Awake Alert, Oriented x 3, No new F.N deficits, Normal affect, Ducor.AT,PERRAL Supple Neck,No JVD, No cervical lymphadenopathy appriciated.  Symmetrical Chest wall movement, Good air movement bilaterally, CTAB RRR,No Gallops,Rubs or new Murmurs, No Parasternal Heave +ve B.Sounds, Abd Soft, Non tender, No organomegaly appriciated, No rebound -guarding or rigidity. No Cyanosis, Clubbing or edema, No new Rash or bruise  Data Review   CBC w Diff: Lab Results  Component Value Date   WBC 12.2* 02/18/2015   WBC 10.1 11/03/2013   HGB 10.3* 02/18/2015   HGB 13.2 11/03/2013   HCT 31.9* 02/18/2015   HCT 39.9* 11/03/2013   PLT 332 02/18/2015   PLT 251 11/03/2013   LYMPHOPCT 7 02/12/2015   LYMPHOPCT 16.5 11/03/2013   MONOPCT 11 02/12/2015   MONOPCT 10.1 11/03/2013   EOSPCT 0 02/12/2015   EOSPCT 4.2 11/03/2013   BASOPCT 1 02/12/2015   BASOPCT 0.7 11/03/2013    CMP: Lab Results  Component Value Date   NA 141 02/18/2015   NA 143 11/03/2013   NA 136* 10/01/2010   K 3.3* 02/18/2015   K 3.3* 11/03/2013   CL 106 02/18/2015   CL 106 11/03/2013   CO2 28 02/18/2015   CO2 31 11/03/2013   BUN 12 02/18/2015   BUN 15 11/03/2013   BUN 22* 10/01/2010   CREATININE 0.88 02/18/2015   CREATININE 1.00 11/03/2013   CREATININE 0.6 10/01/2010  GLU 93 10/01/2010   PROT 6.3* 02/17/2015   PROT 7.5 10/29/2013   ALBUMIN 2.6* 02/17/2015   ALBUMIN 2.1* 10/31/2013   BILITOT 0.5 02/17/2015   BILITOT 1.2* 10/29/2013   ALKPHOS 50 02/17/2015   ALKPHOS 90 10/29/2013   AST 12* 02/17/2015   AST 17 10/29/2013   ALT 13* 02/17/2015   ALT 25 10/29/2013  .   Total Time in preparing paper work, data evaluation and todays exam - 35 minutes  Mohmmad Saleeby M.D on 02/18/2015 at 7:58 AM    Note: This  dictation was prepared with Dragon dictation along with smaller phrase technology. Any transcriptional errors that result from this process are unintentional.

## 2015-02-18 NOTE — Care Management (Signed)
Discharge to home today per Dr. Luberta Mutter. Will resume services thru [facility].  [facility] updated. Telephone call to sister Santo Held. States she will go over to her brother's to be sure the caregiver is in the home. Will call this care manager back if caregiver is in the home. Will be transported per EMS. Will get nebulizer from [facility].  Gwenette Greet RN MSN CCM Care Management [phone]

## 2015-02-18 NOTE — Plan of Care (Addendum)
No c/o pain. VSS, afebrile. BP runs high, scheduled Metoprolol given w/ noted relief.   Problem: Skin Integrity: Goal: Risk for impaired skin integrity will decrease Outcome: Progressing Wound care provided. Q2h turning. Peri and foley care performed. Diaphoretic at times through the night.  Problem: Activity: Goal: Risk for activity intolerance will decrease Outcome: Progressing Total assist w/ movement in bed. Rest periods provided  Problem: Fluid Volume: Goal: Ability to maintain a balanced intake and output will improve Outcome: Progressing IVF infusing as ordered.   Problem: Nutrition: Goal: Adequate nutrition will be maintained Outcome: Progressing Tolerating DYS 1 diet w/ nectar thick liquids. Requires feeing. Pt sister expressed concern that pt does not like DYS 1 food options and would like to know when he can eat regular foods. I explained Speech therapy is following and will advance as he safely tolerates.

## 2015-02-18 NOTE — Progress Notes (Signed)
Speech Language Pathology Treatment: Dysphagia  Patient Details Name: Travis Cantrell MRN: [id number] DOB: Aug 03, 1960 Today's Date: 02/18/2015 Time: 1030-1100 SLP Time Calculation (min) (ACUTE ONLY): 30 min  Assessment / Plan / Recommendation Clinical Impression  Pt seen during session for education of aspiration precautions; education on rec'd diet consistency (Puree w/ Nectar consistency liquids). No family or caregiver was present. Pt gave verbal understanding of information provided but would rec'd continued f/u w/ ST services via [facility] SLP contact to provide ongoing information; monitor toleration of diet and trials to upgrade consistency of foods/liquids as appropriate. Rec. f/u, Outpatient objective assessment(MBSS) as indicated if any concerns for pharyngeal phase dysphagia, aspiration. NSG updated. Thickened liquids and handouts on ordering info, aspiration precautions provided and left in room.    HPI HPI: Pt is a 55 y.o. male with a known history of multiple sclerosis, hypertension, GERD, decubitus ulcer, Proteus mirabilis urinary tract infection presented to the emergency room with lethargy, confusion and difficulty breathing. Patient lives with roommates at home. They found him with decreased responsiveness and EMS was called. When EMS arrived at patient's house he was having labored breathing with decreased responsiveness. Patient was bagged by the EMS and brought to the emergency emergency room. In the emergency room patient was electively intubated and put on ventilator for respiratory failure and for hypoxia and and decreased O2 saturation. Also had a fever of 10 23F. Not much history could be (the patient. Agent was given etomidate and rocuronium prior to intubation. She was resuscitated with IV fluids, 4 L of fluids were given for resuscitation. Initially hypotensive but later on after fluid resuscitation was done blood pressure responded and improved to 110/85 mmHg. His fever came down to 10  79F. Patient was given IV vancomycin and IV Zosyn antibiotics in the emergency room. Patient has a chronic indwelling Foley catheter and cloudy urine was observed and was poorly maintained. Patient was recently discharged from our hospital on 02/01/2014 after being managed for urinary tract infection and abdominal pain. Pt was extubated 02/15/15 and has tolerated such. He appears slightly drowsy; awakened to verbal cues. Pt was verbal w/ SLP. He appeared weak and was slow to respond. Min. Verbal and tactile cues were required during the session to help pt maintain attention/alertness to task and discussion of aspiration precautions and diet recommendation. He has tolerated the po's as rec'd during meals per NSG report.       SLP Plan  Continue with current plan of care (while admitted)     Recommendations  Diet recommendations: Dysphagia 1 (puree);Nectar-thick liquid Liquids provided via: Cup;Straw Medication Administration: Crushed with puree Supervision: Staff to assist with self feeding;Full supervision/cueing for compensatory strategies;Trained caregiver to feed patient Compensations: Minimize environmental distractions;Slow rate;Small sips/bites;Follow solids with liquid;Lingual sweep for clearance of pocketing Postural Changes and/or Swallow Maneuvers: Seated upright 90 degrees             Oral Care Recommendations: Staff/trained caregiver to provide oral care;Oral care BID Follow up Recommendations: Home health SLP (for toleration of diet; trials to upgrade; education) Plan: Continue with current plan of care (while admitted)     GO               Travis Som, MS, CCC-SLP  Hulan Szumski 02/18/2015, 11:11 AM

## 2015-02-19 ENCOUNTER — Telehealth: Payer: Self-pay | Admitting: *Deleted

## 2015-02-19 NOTE — Telephone Encounter (Signed)
Transitional care call attempted.  No answer and no voice mail to leave a message. Will continue attempts to contact patient.

## 2015-02-20 ENCOUNTER — Telehealth: Payer: Self-pay

## 2015-02-20 DIAGNOSIS — Z436 Encounter for attention to other artificial openings of urinary tract: Secondary | ICD-10-CM | POA: Diagnosis not present

## 2015-02-20 DIAGNOSIS — L89312 Pressure ulcer of right buttock, stage 2: Secondary | ICD-10-CM | POA: Diagnosis not present

## 2015-02-20 DIAGNOSIS — I10 Essential (primary) hypertension: Secondary | ICD-10-CM | POA: Diagnosis not present

## 2015-02-20 DIAGNOSIS — G35 Multiple sclerosis: Secondary | ICD-10-CM | POA: Diagnosis not present

## 2015-02-20 DIAGNOSIS — N390 Urinary tract infection, site not specified: Secondary | ICD-10-CM | POA: Diagnosis not present

## 2015-02-20 DIAGNOSIS — L89322 Pressure ulcer of left buttock, stage 2: Secondary | ICD-10-CM | POA: Diagnosis not present

## 2015-02-20 DIAGNOSIS — R6510 Systemic inflammatory response syndrome (SIRS) of non-infectious origin without acute organ dysfunction: Secondary | ICD-10-CM | POA: Diagnosis not present

## 2015-02-20 DIAGNOSIS — G8220 Paraplegia, unspecified: Secondary | ICD-10-CM | POA: Diagnosis not present

## 2015-02-20 NOTE — Telephone Encounter (Signed)
Travis Cantrell with [facility] left v/m requesting cb with verbal orders for home health social worker and verbal order to change foley catheter q 30 days or as needed while receiving [facility].

## 2015-02-20 NOTE — Telephone Encounter (Signed)
Transition Care Management Follow-up Telephone Call   Date discharged? 02/18/2015   How have you been since you were released from the hospital? Generalized weakness   Do you understand why you were in the hospital? yes   Do you understand the discharge instructions? Yes  *Patient reports no concerns with taking medications as prescribed. Encouraged patient to take temp daily due to taking antibiotic. Patient states he has taken temp since discharge but was unable to recall last value.    Where were you discharged to? Home   Items Reviewed:  Medications reviewed: yes  Allergies reviewed: yes  Dietary changes reviewed: yes  Referrals reviewed: yes   Functional Questionnaire:   Activities of Daily Living (ADLs):   He states they are independent in the following: None States they require assistance with the following: ambulation, bathing and hygiene, feeding, continence, grooming, toileting and dressing   Any transportation issues/concerns?: no, patient states he uses transportation services for handicapped   Any patient concerns? YES, patient has been discharged home with wound care orders. Patient states a [facility] nurse will performing wound care. Patient reports wound to upper buttock region.    Confirmed importance and date/time of follow-up visits scheduled Yes, F/U appt scheduled with Dr. Ermalene Searing for 02/24/15 @ 10:45AM    Confirmed with patient if condition begins to worsen call PCP or go to the ER.  Patient was given the office number and encouraged to call back with question or concerns.  : yes

## 2015-02-20 NOTE — Telephone Encounter (Signed)
Verbal orders given to Amy Pope with [facility] for [facility] Social Worker and to change foley catheter q 30 days or as needed while receiving services.

## 2015-02-23 ENCOUNTER — Emergency Department
Admission: EM | Admit: 2015-02-23 | Discharge: 2015-02-23 | Disposition: A | Discharge status: Home / Self Care | Payer: Medicare Other | Attending: Emergency Medicine | Admitting: Emergency Medicine

## 2015-02-23 ENCOUNTER — Telehealth: Payer: Self-pay | Admitting: Family Medicine

## 2015-02-23 DIAGNOSIS — R339 Retention of urine, unspecified: Secondary | ICD-10-CM | POA: Diagnosis present

## 2015-02-23 DIAGNOSIS — I10 Essential (primary) hypertension: Secondary | ICD-10-CM | POA: Insufficient documentation

## 2015-02-23 DIAGNOSIS — G8220 Paraplegia, unspecified: Secondary | ICD-10-CM | POA: Diagnosis not present

## 2015-02-23 DIAGNOSIS — Z792 Long term (current) use of antibiotics: Secondary | ICD-10-CM | POA: Diagnosis not present

## 2015-02-23 DIAGNOSIS — G35 Multiple sclerosis: Secondary | ICD-10-CM | POA: Diagnosis not present

## 2015-02-23 DIAGNOSIS — Z960 Presence of urogenital implants: Secondary | ICD-10-CM

## 2015-02-23 DIAGNOSIS — L89322 Pressure ulcer of left buttock, stage 2: Secondary | ICD-10-CM | POA: Diagnosis not present

## 2015-02-23 DIAGNOSIS — N390 Urinary tract infection, site not specified: Secondary | ICD-10-CM | POA: Diagnosis not present

## 2015-02-23 DIAGNOSIS — Z466 Encounter for fitting and adjustment of urinary device: Secondary | ICD-10-CM | POA: Insufficient documentation

## 2015-02-23 DIAGNOSIS — T83198A Other mechanical complication of other urinary devices and implants, initial encounter: Secondary | ICD-10-CM | POA: Diagnosis not present

## 2015-02-23 DIAGNOSIS — T83091A Other mechanical complication of indwelling urethral catheter, initial encounter: Secondary | ICD-10-CM | POA: Diagnosis not present

## 2015-02-23 DIAGNOSIS — R6510 Systemic inflammatory response syndrome (SIRS) of non-infectious origin without acute organ dysfunction: Secondary | ICD-10-CM | POA: Diagnosis not present

## 2015-02-23 DIAGNOSIS — Z79899 Other long term (current) drug therapy: Secondary | ICD-10-CM | POA: Insufficient documentation

## 2015-02-23 DIAGNOSIS — T83498A Other mechanical complication of other prosthetic devices, implants and grafts of genital tract, initial encounter: Secondary | ICD-10-CM | POA: Diagnosis not present

## 2015-02-23 DIAGNOSIS — Z978 Presence of other specified devices: Secondary | ICD-10-CM

## 2015-02-23 DIAGNOSIS — Z436 Encounter for attention to other artificial openings of urinary tract: Secondary | ICD-10-CM | POA: Diagnosis not present

## 2015-02-23 DIAGNOSIS — L89312 Pressure ulcer of right buttock, stage 2: Secondary | ICD-10-CM | POA: Diagnosis not present

## 2015-02-23 LAB — URINALYSIS COMPLETE WITH MICROSCOPIC (ARMC ONLY)
BILIRUBIN URINE: NEGATIVE
Glucose, UA: NEGATIVE mg/dL
KETONES UR: NEGATIVE mg/dL
LEUKOCYTES UA: NEGATIVE
NITRITE: NEGATIVE
Protein, ur: 30 mg/dL — AB
Specific Gravity, Urine: 1.027 (ref 1.005–1.030)
pH: 5 (ref 5.0–8.0)

## 2015-02-23 NOTE — Discharge Instructions (Signed)
Please follow up with your PCP in regards to your emergency department visit today. Continue all routine medications.  If any abdominal pain or change in urinary status, please return to this ED for immediate care.

## 2015-02-23 NOTE — ED Notes (Signed)
Patient's home health nurse attempted to change foley and could not

## 2015-02-23 NOTE — Telephone Encounter (Signed)
[facility] -  When she took out the old foley catheter there was some bleeding and she was not able to put the new one it. She is sending the pt to the ed to get it placed because it may be a prostate issue  cb number is [phone]
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
## 2015-02-24 ENCOUNTER — Ambulatory Visit: Payer: Medicare Other | Admitting: Family Medicine

## 2015-02-24 NOTE — Telephone Encounter (Signed)
Noted  

## 2015-02-26 ENCOUNTER — Telehealth: Payer: Self-pay | Admitting: Family Medicine

## 2015-02-26 DIAGNOSIS — L89322 Pressure ulcer of left buttock, stage 2: Secondary | ICD-10-CM | POA: Diagnosis not present

## 2015-02-26 DIAGNOSIS — I10 Essential (primary) hypertension: Secondary | ICD-10-CM | POA: Diagnosis not present

## 2015-02-26 DIAGNOSIS — R6510 Systemic inflammatory response syndrome (SIRS) of non-infectious origin without acute organ dysfunction: Secondary | ICD-10-CM | POA: Diagnosis not present

## 2015-02-26 DIAGNOSIS — G8220 Paraplegia, unspecified: Secondary | ICD-10-CM | POA: Diagnosis not present

## 2015-02-26 DIAGNOSIS — L89312 Pressure ulcer of right buttock, stage 2: Secondary | ICD-10-CM | POA: Diagnosis not present

## 2015-02-26 DIAGNOSIS — G35 Multiple sclerosis: Secondary | ICD-10-CM | POA: Diagnosis not present

## 2015-02-26 DIAGNOSIS — Z436 Encounter for attention to other artificial openings of urinary tract: Secondary | ICD-10-CM | POA: Diagnosis not present

## 2015-02-26 DIAGNOSIS — N390 Urinary tract infection, site not specified: Secondary | ICD-10-CM | POA: Diagnosis not present

## 2015-02-26 NOTE — Telephone Encounter (Signed)
Becky needs a call asap Regarding mr [place] catheter She wants an order to irrrigate Nurse is on way to see him now Verbal is ok

## 2015-02-26 NOTE — Telephone Encounter (Signed)
Verbal order given to [facility] with [facility] that it is okay for Nurse to irrigate Travis Cantrell's catheter.

## 2015-02-26 NOTE — Telephone Encounter (Signed)
This is ok for her to irrigate his catheter.

## 2015-02-27 DIAGNOSIS — L89312 Pressure ulcer of right buttock, stage 2: Secondary | ICD-10-CM | POA: Diagnosis not present

## 2015-02-27 DIAGNOSIS — N390 Urinary tract infection, site not specified: Secondary | ICD-10-CM | POA: Diagnosis not present

## 2015-02-27 DIAGNOSIS — G35 Multiple sclerosis: Secondary | ICD-10-CM | POA: Diagnosis not present

## 2015-02-27 DIAGNOSIS — Z436 Encounter for attention to other artificial openings of urinary tract: Secondary | ICD-10-CM | POA: Diagnosis not present

## 2015-02-27 DIAGNOSIS — I10 Essential (primary) hypertension: Secondary | ICD-10-CM | POA: Diagnosis not present

## 2015-02-27 DIAGNOSIS — L89322 Pressure ulcer of left buttock, stage 2: Secondary | ICD-10-CM | POA: Diagnosis not present

## 2015-02-27 DIAGNOSIS — R6510 Systemic inflammatory response syndrome (SIRS) of non-infectious origin without acute organ dysfunction: Secondary | ICD-10-CM | POA: Diagnosis not present

## 2015-02-27 DIAGNOSIS — G8220 Paraplegia, unspecified: Secondary | ICD-10-CM | POA: Diagnosis not present

## 2015-02-28 ENCOUNTER — Other Ambulatory Visit: Payer: Self-pay | Admitting: Family Medicine

## 2015-02-28 NOTE — Telephone Encounter (Signed)
Last office visit 07/24/2014.  Not on current medication list.  Ok to refill?

## 2015-03-02 ENCOUNTER — Telehealth: Payer: Self-pay | Admitting: Family Medicine

## 2015-03-02 DIAGNOSIS — R6510 Systemic inflammatory response syndrome (SIRS) of non-infectious origin without acute organ dysfunction: Secondary | ICD-10-CM | POA: Diagnosis not present

## 2015-03-02 DIAGNOSIS — L89312 Pressure ulcer of right buttock, stage 2: Secondary | ICD-10-CM | POA: Diagnosis not present

## 2015-03-02 DIAGNOSIS — Z436 Encounter for attention to other artificial openings of urinary tract: Secondary | ICD-10-CM | POA: Diagnosis not present

## 2015-03-02 DIAGNOSIS — L89322 Pressure ulcer of left buttock, stage 2: Secondary | ICD-10-CM | POA: Diagnosis not present

## 2015-03-02 DIAGNOSIS — I10 Essential (primary) hypertension: Secondary | ICD-10-CM | POA: Diagnosis not present

## 2015-03-02 DIAGNOSIS — G35 Multiple sclerosis: Secondary | ICD-10-CM | POA: Diagnosis not present

## 2015-03-02 DIAGNOSIS — G8220 Paraplegia, unspecified: Secondary | ICD-10-CM | POA: Diagnosis not present

## 2015-03-02 DIAGNOSIS — N390 Urinary tract infection, site not specified: Secondary | ICD-10-CM | POA: Diagnosis not present

## 2015-03-02 NOTE — Telephone Encounter (Signed)
Verbal order given to Dorothy with [facility] to increase foley catheter to 18 [place] per Dr. Patsy Lager.

## 2015-03-02 NOTE — Telephone Encounter (Signed)
[facility] called  Requesting a larger foley cath, it keeps leaking. Pt has stated that he has had a bigger size and pt is rewuesting a 18 size  cb number is [phone] Thanks

## 2015-03-05 DIAGNOSIS — L89322 Pressure ulcer of left buttock, stage 2: Secondary | ICD-10-CM | POA: Diagnosis not present

## 2015-03-05 DIAGNOSIS — R6510 Systemic inflammatory response syndrome (SIRS) of non-infectious origin without acute organ dysfunction: Secondary | ICD-10-CM | POA: Diagnosis not present

## 2015-03-05 DIAGNOSIS — N390 Urinary tract infection, site not specified: Secondary | ICD-10-CM | POA: Diagnosis not present

## 2015-03-05 DIAGNOSIS — L89312 Pressure ulcer of right buttock, stage 2: Secondary | ICD-10-CM | POA: Diagnosis not present

## 2015-03-05 DIAGNOSIS — G8220 Paraplegia, unspecified: Secondary | ICD-10-CM | POA: Diagnosis not present

## 2015-03-05 DIAGNOSIS — Z436 Encounter for attention to other artificial openings of urinary tract: Secondary | ICD-10-CM | POA: Diagnosis not present

## 2015-03-05 DIAGNOSIS — I10 Essential (primary) hypertension: Secondary | ICD-10-CM | POA: Diagnosis not present

## 2015-03-05 DIAGNOSIS — G35 Multiple sclerosis: Secondary | ICD-10-CM | POA: Diagnosis not present

## 2015-03-09 DIAGNOSIS — L89152 Pressure ulcer of sacral region, stage 2: Secondary | ICD-10-CM | POA: Diagnosis not present

## 2015-03-09 DIAGNOSIS — R32 Unspecified urinary incontinence: Secondary | ICD-10-CM | POA: Diagnosis not present

## 2015-03-10 ENCOUNTER — Other Ambulatory Visit: Payer: Self-pay | Admitting: Family Medicine

## 2015-03-10 ENCOUNTER — Other Ambulatory Visit: Payer: Self-pay | Admitting: *Deleted

## 2015-03-10 ENCOUNTER — Telehealth: Payer: Self-pay | Admitting: *Deleted

## 2015-03-10 DIAGNOSIS — Z436 Encounter for attention to other artificial openings of urinary tract: Secondary | ICD-10-CM | POA: Diagnosis not present

## 2015-03-10 DIAGNOSIS — L89312 Pressure ulcer of right buttock, stage 2: Secondary | ICD-10-CM | POA: Diagnosis not present

## 2015-03-10 DIAGNOSIS — N390 Urinary tract infection, site not specified: Secondary | ICD-10-CM | POA: Diagnosis not present

## 2015-03-10 DIAGNOSIS — G35 Multiple sclerosis: Secondary | ICD-10-CM | POA: Diagnosis not present

## 2015-03-10 DIAGNOSIS — L89322 Pressure ulcer of left buttock, stage 2: Secondary | ICD-10-CM | POA: Diagnosis not present

## 2015-03-10 DIAGNOSIS — I10 Essential (primary) hypertension: Secondary | ICD-10-CM | POA: Diagnosis not present

## 2015-03-10 DIAGNOSIS — R6510 Systemic inflammatory response syndrome (SIRS) of non-infectious origin without acute organ dysfunction: Secondary | ICD-10-CM | POA: Diagnosis not present

## 2015-03-10 DIAGNOSIS — G8220 Paraplegia, unspecified: Secondary | ICD-10-CM | POA: Diagnosis not present

## 2015-03-10 NOTE — Telephone Encounter (Signed)
Nicole Cella is on her way to the home to assess patient now.  When she called to notify him, he stated that his Foley was out again.  He has had continued issues with this for the past 3 weeks, despite increasing catheter size last week.  Nicole Cella has concerns regarding his increased risk of infection given 3 Foley changes within the past 3 weeks.  Are there other options for Travis Cantrell (? adult diapers)?  Please advise.

## 2015-03-10 NOTE — Telephone Encounter (Signed)
Left message for [name] with [facility] to call his urologist for advise/recommendations about Travis Cantrell's Foley Cath.  Dr. Ermalene Searing feels adult diapers would be fine but would need someone in the home to help change the daiper frequently.

## 2015-03-10 NOTE — Telephone Encounter (Signed)
Error

## 2015-03-10 NOTE — Telephone Encounter (Signed)
She can call his urologist for advice. In my opinion, adult diaper would be a fine option as long as he has assistance to help him change frequently  ( I am not sure he really does).  What is patients opinion?Marland Kitchen

## 2015-03-11 NOTE — Telephone Encounter (Signed)
Last office visit 07/24/2014.  Ibuprofen 800 mg not on current medication list.  Refill?

## 2015-03-12 MED ORDER — GABAPENTIN 300 MG PO CAPS: 900.0000 mg | ORAL_CAPSULE | Freq: Three times a day (TID) | ORAL | Status: DC

## 2015-03-16 ENCOUNTER — Encounter: Payer: Self-pay | Admitting: Emergency Medicine

## 2015-03-16 ENCOUNTER — Inpatient Hospital Stay
Admission: EM | Admit: 2015-03-16 | Discharge: 2015-03-19 | DRG: 700 | Disposition: A | Discharge status: Home Health | Payer: Medicare Other | Attending: Internal Medicine | Admitting: Internal Medicine

## 2015-03-16 DIAGNOSIS — B964 Proteus (mirabilis) (morganii) as the cause of diseases classified elsewhere: Secondary | ICD-10-CM | POA: Diagnosis present

## 2015-03-16 DIAGNOSIS — Z833 Family history of diabetes mellitus: Secondary | ICD-10-CM | POA: Diagnosis not present

## 2015-03-16 DIAGNOSIS — Z743 Need for continuous supervision: Secondary | ICD-10-CM | POA: Diagnosis not present

## 2015-03-16 DIAGNOSIS — Z7401 Bed confinement status: Secondary | ICD-10-CM | POA: Diagnosis not present

## 2015-03-16 DIAGNOSIS — Y92009 Unspecified place in unspecified non-institutional (private) residence as the place of occurrence of the external cause: Secondary | ICD-10-CM

## 2015-03-16 DIAGNOSIS — T83511A Infection and inflammatory reaction due to indwelling urethral catheter, initial encounter: Principal | ICD-10-CM | POA: Diagnosis present

## 2015-03-16 DIAGNOSIS — F329 Major depressive disorder, single episode, unspecified: Secondary | ICD-10-CM | POA: Diagnosis present

## 2015-03-16 DIAGNOSIS — K5909 Other constipation: Secondary | ICD-10-CM | POA: Diagnosis present

## 2015-03-16 DIAGNOSIS — Z8249 Family history of ischemic heart disease and other diseases of the circulatory system: Secondary | ICD-10-CM | POA: Diagnosis not present

## 2015-03-16 DIAGNOSIS — L89312 Pressure ulcer of right buttock, stage 2: Secondary | ICD-10-CM | POA: Diagnosis not present

## 2015-03-16 DIAGNOSIS — Z807 Family history of other malignant neoplasms of lymphoid, hematopoietic and related tissues: Secondary | ICD-10-CM | POA: Diagnosis not present

## 2015-03-16 DIAGNOSIS — A419 Sepsis, unspecified organism: Secondary | ICD-10-CM | POA: Diagnosis present

## 2015-03-16 DIAGNOSIS — Z79899 Other long term (current) drug therapy: Secondary | ICD-10-CM | POA: Diagnosis not present

## 2015-03-16 DIAGNOSIS — Y731 Therapeutic (nonsurgical) and rehabilitative gastroenterology and urology devices associated with adverse incidents: Secondary | ICD-10-CM | POA: Diagnosis present

## 2015-03-16 DIAGNOSIS — L89322 Pressure ulcer of left buttock, stage 2: Secondary | ICD-10-CM | POA: Diagnosis present

## 2015-03-16 DIAGNOSIS — I10 Essential (primary) hypertension: Secondary | ICD-10-CM | POA: Diagnosis not present

## 2015-03-16 DIAGNOSIS — T83198A Other mechanical complication of other urinary devices and implants, initial encounter: Secondary | ICD-10-CM | POA: Diagnosis not present

## 2015-03-16 DIAGNOSIS — G35 Multiple sclerosis: Secondary | ICD-10-CM | POA: Diagnosis not present

## 2015-03-16 DIAGNOSIS — N390 Urinary tract infection, site not specified: Secondary | ICD-10-CM | POA: Diagnosis not present

## 2015-03-16 DIAGNOSIS — L89152 Pressure ulcer of sacral region, stage 2: Secondary | ICD-10-CM | POA: Diagnosis not present

## 2015-03-16 DIAGNOSIS — R531 Weakness: Secondary | ICD-10-CM | POA: Diagnosis not present

## 2015-03-16 DIAGNOSIS — Z87440 Personal history of urinary (tract) infections: Secondary | ICD-10-CM | POA: Diagnosis not present

## 2015-03-16 DIAGNOSIS — E785 Hyperlipidemia, unspecified: Secondary | ICD-10-CM | POA: Diagnosis present

## 2015-03-16 DIAGNOSIS — T83498A Other mechanical complication of other prosthetic devices, implants and grafts of genital tract, initial encounter: Secondary | ICD-10-CM | POA: Diagnosis not present

## 2015-03-16 LAB — COMPREHENSIVE METABOLIC PANEL
ALBUMIN: 3.7 g/dL (ref 3.5–5.0)
ALT: 15 U/L — ABNORMAL LOW (ref 17–63)
AST: 18 U/L (ref 15–41)
Alkaline Phosphatase: 80 U/L (ref 38–126)
Anion gap: 6 (ref 5–15)
BILIRUBIN TOTAL: 0.9 mg/dL (ref 0.3–1.2)
BUN: 31 mg/dL — AB (ref 6–20)
CHLORIDE: 106 mmol/L (ref 101–111)
CO2: 25 mmol/L (ref 22–32)
Calcium: 9.4 mg/dL (ref 8.9–10.3)
Creatinine, Ser: 0.94 mg/dL (ref 0.61–1.24)
GFR calc Af Amer: >60 mL/min (ref >60)
GFR calc non Af Amer: >60 mL/min (ref >60)
GLUCOSE: 149 mg/dL — AB (ref 65–99)
POTASSIUM: 4 mmol/L (ref 3.5–5.1)
Sodium: 137 mmol/L (ref 135–145)
Total Protein: 7.9 g/dL (ref 6.5–8.1)

## 2015-03-16 LAB — CBC WITH DIFFERENTIAL/PLATELET
BASOS ABS: 0.1 10*3/uL (ref 0–0.1)
BASOS PCT: 1 %
Eosinophils Absolute: 0.2 10*3/uL (ref 0–0.7)
Eosinophils Relative: 1 %
HEMATOCRIT: 43.3 % (ref 40.0–52.0)
Hemoglobin: 14 g/dL (ref 13.0–18.0)
Lymphocytes Relative: 20 %
Lymphs Abs: 2.6 10*3/uL (ref 1.0–3.6)
MCH: 25.5 pg — ABNORMAL LOW (ref 26.0–34.0)
MCHC: 32.3 g/dL (ref 32.0–36.0)
MCV: 79.1 fL — ABNORMAL LOW (ref 80.0–100.0)
MONO ABS: 0.9 10*3/uL (ref 0.2–1.0)
Monocytes Relative: 7 %
Neutro Abs: 9.3 10*3/uL — ABNORMAL HIGH (ref 1.4–6.5)
Neutrophils Relative %: 71 %
PLATELETS: 284 10*3/uL (ref 150–440)
RBC: 5.47 MIL/uL (ref 4.40–5.90)
RDW: 16.7 % — AB (ref 11.5–14.5)
WBC: 13.1 10*3/uL — AB (ref 3.8–10.6)

## 2015-03-16 LAB — URINALYSIS COMPLETE WITH MICROSCOPIC (ARMC ONLY)
Glucose, UA: NEGATIVE mg/dL
Hgb urine dipstick: NEGATIVE
Ketones, ur: NEGATIVE mg/dL
Nitrite: POSITIVE — AB
PH: 9 — AB (ref 5.0–8.0)
SQUAMOUS EPITHELIAL / LPF: NONE SEEN
Specific Gravity, Urine: 1.02 (ref 1.005–1.030)

## 2015-03-16 LAB — LACTIC ACID, PLASMA: LACTIC ACID, VENOUS: 0.9 mmol/L (ref 0.5–2.0)

## 2015-03-16 LAB — TSH: TSH: 3.626 u[IU]/mL (ref 0.350–4.500)

## 2015-03-16 LAB — LIPASE, BLOOD: Lipase: 23 U/L (ref 11–51)

## 2015-03-16 MED ORDER — DOCUSATE SODIUM 100 MG PO CAPS
100.0000 mg | ORAL_CAPSULE | Freq: Two times a day (BID) | ORAL | Status: DC
  Administered 2015-03-16 – 2015-03-18 (×5): 100 mg via ORAL
  Filled 2015-03-16 (×6): qty 1

## 2015-03-16 MED ORDER — FLUOXETINE HCL 20 MG PO CAPS
40.0000 mg | ORAL_CAPSULE | Freq: Every day | ORAL | Status: DC
  Administered 2015-03-17 – 2015-03-19 (×3): 40 mg via ORAL
  Filled 2015-03-16 (×3): qty 2

## 2015-03-16 MED ORDER — PIPERACILLIN-TAZOBACTAM 3.375 G IVPB
3.3750 g | Freq: Once | INTRAVENOUS | Status: AC
  Administered 2015-03-16: 3.375 g via INTRAVENOUS
  Filled 2015-03-16: qty 50

## 2015-03-16 MED ORDER — OXYBUTYNIN CHLORIDE 5 MG PO TABS: 5.0000 mg | ORAL_TABLET | Freq: Three times a day (TID) | ORAL | Status: DC | PRN

## 2015-03-16 MED ORDER — ATORVASTATIN CALCIUM 20 MG PO TABS
40.0000 mg | ORAL_TABLET | Freq: Every day | ORAL | Status: DC
  Administered 2015-03-16 – 2015-03-18 (×3): 40 mg via ORAL
  Filled 2015-03-16 (×3): qty 2

## 2015-03-16 MED ORDER — GABAPENTIN 300 MG PO CAPS
900.0000 mg | ORAL_CAPSULE | Freq: Three times a day (TID) | ORAL | Status: DC
  Administered 2015-03-16 – 2015-03-19 (×8): 900 mg via ORAL
  Filled 2015-03-16 (×8): qty 3

## 2015-03-16 MED ORDER — ACETAMINOPHEN 325 MG PO TABS
650.0000 mg | ORAL_TABLET | Freq: Four times a day (QID) | ORAL | Status: DC | PRN
  Administered 2015-03-17 – 2015-03-18 (×3): 650 mg via ORAL
  Filled 2015-03-16 (×3): qty 2

## 2015-03-16 MED ORDER — SODIUM CHLORIDE 0.9 % IV SOLN
INTRAVENOUS | Status: DC
  Administered 2015-03-16: via INTRAVENOUS

## 2015-03-16 MED ORDER — ALBUTEROL SULFATE (2.5 MG/3ML) 0.083% IN NEBU: 2.5000 mg | INHALATION_SOLUTION | RESPIRATORY_TRACT | Status: DC | PRN

## 2015-03-16 MED ORDER — NYSTATIN 100000 UNIT/GM EX POWD
1.0000 g | Freq: Four times a day (QID) | CUTANEOUS | Status: DC
  Administered 2015-03-18 – 2015-03-19 (×5): 1 g via TOPICAL
  Filled 2015-03-16 (×2): qty 60

## 2015-03-16 MED ORDER — ENSURE ENLIVE PO LIQD: 237.0000 mL | Freq: Two times a day (BID) | ORAL | Status: DC

## 2015-03-16 MED ORDER — PANTOPRAZOLE SODIUM 40 MG PO TBEC: 80.0000 mg | DELAYED_RELEASE_TABLET | Freq: Every day | ORAL | Status: DC

## 2015-03-16 MED ORDER — CEFTRIAXONE SODIUM 1 G IJ SOLR: 1.0000 g | Freq: Once | INTRAMUSCULAR | Status: DC

## 2015-03-16 MED ORDER — ACETAMINOPHEN 500 MG PO TABS
1000.0000 mg | ORAL_TABLET | ORAL | Status: AC
  Administered 2015-03-16: 1000 mg via ORAL
  Filled 2015-03-16: qty 2

## 2015-03-16 MED ORDER — PIPERACILLIN-TAZOBACTAM 3.375 G IVPB
3.3750 g | Freq: Three times a day (TID) | INTRAVENOUS | Status: DC
  Administered 2015-03-16 – 2015-03-19 (×8): 3.375 g via INTRAVENOUS
  Filled 2015-03-16 (×11): qty 50

## 2015-03-16 MED ORDER — HEPARIN SODIUM (PORCINE) 5000 UNIT/ML IJ SOLN
5000.0000 [IU] | Freq: Three times a day (TID) | INTRAMUSCULAR | Status: DC
  Administered 2015-03-16 – 2015-03-17 (×2): 5000 [IU] via SUBCUTANEOUS
  Filled 2015-03-16 (×2): qty 1

## 2015-03-16 MED ORDER — METOPROLOL SUCCINATE ER 25 MG PO TB24
25.0000 mg | ORAL_TABLET | Freq: Every day | ORAL | Status: DC
  Administered 2015-03-16 – 2015-03-19 (×3): 25 mg via ORAL
  Filled 2015-03-16 (×3): qty 1

## 2015-03-16 MED ORDER — VITAMIN D 1000 UNITS PO TABS
1000.0000 [IU] | ORAL_TABLET | Freq: Every day | ORAL | Status: DC
  Administered 2015-03-16 – 2015-03-19 (×4): 1000 [IU] via ORAL
  Filled 2015-03-16 (×4): qty 1

## 2015-03-16 MED ORDER — FLUOXETINE HCL 20 MG PO CAPS
20.0000 mg | ORAL_CAPSULE | Freq: Every evening | ORAL | Status: DC
  Administered 2015-03-16 – 2015-03-18 (×3): 20 mg via ORAL
  Filled 2015-03-16 (×3): qty 1

## 2015-03-16 MED ORDER — SODIUM CHLORIDE 0.9% FLUSH
3.0000 mL | Freq: Two times a day (BID) | INTRAVENOUS | Status: DC
  Administered 2015-03-16 – 2015-03-19 (×4): 3 mL via INTRAVENOUS

## 2015-03-16 MED ORDER — ONDANSETRON HCL 4 MG PO TABS: 4.0000 mg | ORAL_TABLET | Freq: Four times a day (QID) | ORAL | Status: DC | PRN

## 2015-03-16 MED ORDER — SACCHAROMYCES BOULARDII 250 MG PO CAPS
250.0000 mg | ORAL_CAPSULE | Freq: Two times a day (BID) | ORAL | Status: DC
  Administered 2015-03-16 – 2015-03-19 (×6): 250 mg via ORAL
  Filled 2015-03-16 (×8): qty 1

## 2015-03-16 MED ORDER — MORPHINE SULFATE (PF) 2 MG/ML IV SOLN: 1.0000 mg | INTRAVENOUS | Status: DC | PRN

## 2015-03-16 MED ORDER — ACETAMINOPHEN 650 MG RE SUPP: 650.0000 mg | Freq: Four times a day (QID) | RECTAL | Status: DC | PRN

## 2015-03-16 MED ORDER — DIAZEPAM 5 MG PO TABS: 5.0000 mg | ORAL_TABLET | Freq: Two times a day (BID) | ORAL | Status: DC | PRN

## 2015-03-16 MED ORDER — ONDANSETRON HCL 4 MG/2ML IJ SOLN: 4.0000 mg | Freq: Four times a day (QID) | INTRAMUSCULAR | Status: DC | PRN

## 2015-03-16 MED ORDER — SODIUM CHLORIDE 0.9 % IV BOLUS (SEPSIS)
1000.0000 mL | Freq: Once | INTRAVENOUS | Status: AC
  Administered 2015-03-16: 1000 mL via INTRAVENOUS

## 2015-03-16 MED ORDER — LISINOPRIL 10 MG PO TABS
10.0000 mg | ORAL_TABLET | Freq: Every day | ORAL | Status: DC
  Administered 2015-03-16 – 2015-03-19 (×2): 10 mg via ORAL
  Filled 2015-03-16 (×3): qty 1

## 2015-03-16 MED ORDER — PANTOPRAZOLE SODIUM 40 MG PO TBEC
40.0000 mg | DELAYED_RELEASE_TABLET | Freq: Two times a day (BID) | ORAL | Status: DC
  Administered 2015-03-16 – 2015-03-19 (×6): 40 mg via ORAL
  Filled 2015-03-16 (×6): qty 1

## 2015-03-16 MED ORDER — IBUPROFEN 400 MG PO TABS: 800.0000 mg | ORAL_TABLET | Freq: Three times a day (TID) | ORAL | Status: DC | PRN

## 2015-03-16 NOTE — ED Notes (Signed)
Pt arrived via EMS from home. Pt has MS and has foley cath in place, yesterday noticed urine in foley bag darker than normal and has foul odor, worse today. Catheter has been in for a couple of weeks. Urine is very cloudy.
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
## 2015-03-16 NOTE — Consult Note (Signed)
Pharmacy Antibiotic Note  Travis Cantrell is a 55 y.o. male admitted on 03/16/2015 with sepsis and UTI.  Pharmacy has been consulted for zosyn dosing.  Plan: Zosyn 3.375g IV q8h (4 hour infusion).  Height: 5\' 10"  (177.8 cm) Weight: 207 lb (93.895 kg) IBW/kg (Calculated) : 73  Temp (24hrs), Avg:98.9 F (37.2 C), Min:98.7 F (37.1 C), Max:99 F (37.2 C)   Recent Labs Lab 03/16/15 1616 03/16/15 1617  WBC  --  13.1*  CREATININE  --  0.94  LATICACIDVEN 0.9  --     Estimated Creatinine Clearance: 103.4 mL/min (by C-G formula based on Cr of 0.94).    Allergies  Allergen Reactions  . Baclofen Diarrhea    Antimicrobials this admission: zosyn 2/20 >>    Dose adjustments this admission:   Microbiology results:  2/20 UCx: sent    Thank you for allowing pharmacy to be a part of this patient's care.  Olene Floss 03/16/2015 7:48 PM

## 2015-03-17 LAB — MRSA PCR SCREENING: MRSA BY PCR: NEGATIVE

## 2015-03-17 LAB — HEMOGLOBIN A1C: Hgb A1c MFr Bld: 4.8 % (ref 4.0–6.0)

## 2015-03-17 MED ORDER — SODIUM CHLORIDE 0.9 % IV BOLUS (SEPSIS)
250.0000 mL | Freq: Once | INTRAVENOUS | Status: AC
  Administered 2015-03-17: 250 mL via INTRAVENOUS

## 2015-03-17 MED ORDER — SODIUM CHLORIDE 0.9 % IV SOLN
INTRAVENOUS | Status: DC
  Administered 2015-03-17 – 2015-03-18 (×2): via INTRAVENOUS

## 2015-03-17 MED ORDER — ENSURE ENLIVE PO LIQD
237.0000 mL | Freq: Two times a day (BID) | ORAL | Status: DC
  Administered 2015-03-17 – 2015-03-19 (×4): 237 mL via ORAL

## 2015-03-17 MED ORDER — ENOXAPARIN SODIUM 40 MG/0.4ML ~~LOC~~ SOLN
40.0000 mg | SUBCUTANEOUS | Status: DC
  Administered 2015-03-17 – 2015-03-18 (×2): 40 mg via SUBCUTANEOUS
  Filled 2015-03-17 (×2): qty 0.4

## 2015-03-17 NOTE — Progress Notes (Signed)
Pts BP 105/78. Per MD Allena Katz hold BP meds this am. Will continue to monitor.

## 2015-03-17 NOTE — H&P (Signed)
Travis Cantrell is an 55 y.o. male.   Chief Complaint: Dark urine HPI: The patient with past medical history significant for multiple sclerosis presents to the emergency room due to concern for dark colored urine.  The patient has had many urinary tract infections in the past that presented similarly.  He denies fever, nausea, vomiting or diarrhea.  Urinalysis in the emergency department showed infection.  Due to signs and symptoms of early sepsis the emergency department staff called for admission.  Past Medical History  Diagnosis Date  . Allergy   . GERD (gastroesophageal reflux disease)   . MS (multiple sclerosis) ([place])   . Hypertension   . Decubitus ulcer     Past Surgical History  Procedure Laterality Date  . Vein reconsruction    . Rotator cuff repair      Family History  Problem Relation Age of Onset  . Cancer Mother     multiple myeloma  . Diabetes Father   . Heart attack Father    Social History:  reports that he has never smoked. He has never used smokeless tobacco. He reports that he does not drink alcohol or use illicit drugs.  Allergies:  Allergies  Allergen Reactions  . Baclofen Diarrhea    Medications Prior to Admission  Medication Sig Dispense Refill  . albuterol (PROVENTIL) (2.5 MG/3ML) 0.083% nebulizer solution Take 3 mLs (2.5 mg total) by nebulization every 4 (four) hours as needed for wheezing or shortness of breath. 75 mL 12  . atorvastatin (LIPITOR) 40 MG tablet Take 40 mg by mouth at bedtime.    . cholecalciferol (VITAMIN D) 1000 units tablet Take 1,000 Units by mouth daily.    . diazepam (VALIUM) 5 MG tablet Take 5 mg by mouth every 12 (twelve) hours as needed for anxiety.    . docusate sodium (COLACE) 100 MG capsule Take 1 capsule (100 mg total) by mouth 2 (two) times daily. 10 capsule 0  . feeding supplement, ENSURE ENLIVE, (ENSURE ENLIVE) LIQD Take 237 mLs by mouth 2 (two) times daily with a meal. 237 mL 12  . FLUoxetine (PROZAC) 20 MG capsule Take  20-40 mg by mouth 2 (two) times daily. Pt takes two capsules in the morning and one in the evening.    . gabapentin (NEURONTIN) 300 MG capsule Take 3 capsules (900 mg total) by mouth 3 (three) times daily. 360 capsule 0  . ibuprofen (ADVIL,MOTRIN) 800 MG tablet Take 800 mg by mouth every 8 (eight) hours as needed for mild pain.    Marland Kitchen lisinopril (PRINIVIL,ZESTRIL) 10 MG tablet Take 10 mg by mouth daily.    . metoprolol succinate (TOPROL-XL) 25 MG 24 hr tablet Take 25 mg by mouth daily.    Marland Kitchen nystatin (MYCOSTATIN) powder Apply 1 g topically 4 (four) times daily.    Marland Kitchen omeprazole (PRILOSEC) 20 MG capsule Take 20 mg by mouth 2 (two) times daily.    Marland Kitchen oxybutynin (DITROPAN) 5 MG tablet Take 1 tablet (5 mg total) by mouth every 8 (eight) hours as needed for bladder spasms. 90 tablet 3  . saccharomyces boulardii (FLORASTOR) 250 MG capsule Take 1 capsule (250 mg total) by mouth 2 (two) times daily. 20 capsule 0  . ciprofloxacin (CIPRO) 500 MG tablet Take 1 tablet (500 mg total) by mouth 2 (two) times daily. (Patient not taking: Reported on 03/16/2015) 14 tablet 0    Results for orders placed or performed during the hospital encounter of 03/16/15 (from the past 48 hour(s))  Urinalysis  complete, with microscopic- may I&O cath if menses [facility] only)     Status: Abnormal   Collection Time: 03/16/15  4:16 PM  Result Value Ref Range   Color, Urine AMBER (A) YELLOW   APPearance TURBID (A) CLEAR   Glucose, UA NEGATIVE NEGATIVE mg/dL   Bilirubin Urine 1+ (A) NEGATIVE   Ketones, ur NEGATIVE NEGATIVE mg/dL   Specific Gravity, Urine 1.020 1.005 - 1.030   Hgb urine dipstick NEGATIVE NEGATIVE   pH 9.0 (H) 5.0 - 8.0   Protein, ur >500 (A) NEGATIVE mg/dL   Nitrite POSITIVE (A) NEGATIVE   Leukocytes, UA 3+ (A) NEGATIVE   RBC / HPF TOO NUMEROUS TO COUNT 0 - 5 RBC/hpf   WBC, UA TOO NUMEROUS TO COUNT 0 - 5 WBC/hpf   Bacteria, UA MANY (A) NONE SEEN   Squamous Epithelial / LPF NONE SEEN NONE SEEN  Lactic acid, plasma      Status: None   Collection Time: 03/16/15  4:16 PM  Result Value Ref Range   Lactic Acid, Venous 0.9 0.5 - 2.0 mmol/L  CBC with Differential     Status: Abnormal   Collection Time: 03/16/15  4:17 PM  Result Value Ref Range   WBC 13.1 (H) 3.8 - 10.6 K/uL   RBC 5.47 4.40 - 5.90 MIL/uL   Hemoglobin 14.0 13.0 - 18.0 g/dL   HCT 43.3 40.0 - 52.0 %   MCV 79.1 (L) 80.0 - 100.0 fL   MCH 25.5 (L) 26.0 - 34.0 pg   MCHC 32.3 32.0 - 36.0 g/dL   RDW 16.7 (H) 11.5 - 14.5 %   Platelets 284 150 - 440 K/uL   Neutrophils Relative % 71 %   Neutro Abs 9.3 (H) 1.4 - 6.5 K/uL   Lymphocytes Relative 20 %   Lymphs Abs 2.6 1.0 - 3.6 K/uL   Monocytes Relative 7 %   Monocytes Absolute 0.9 0.2 - 1.0 K/uL   Eosinophils Relative 1 %   Eosinophils Absolute 0.2 0 - 0.7 K/uL   Basophils Relative 1 %   Basophils Absolute 0.1 0 - 0.1 K/uL  Comprehensive metabolic panel     Status: Abnormal   Collection Time: 03/16/15  4:17 PM  Result Value Ref Range   Sodium 137 135 - 145 mmol/L   Potassium 4.0 3.5 - 5.1 mmol/L   Chloride 106 101 - 111 mmol/L   CO2 25 22 - 32 mmol/L   Glucose, Bld 149 (H) 65 - 99 mg/dL   BUN 31 (H) 6 - 20 mg/dL   Creatinine, Ser 0.94 0.61 - 1.24 mg/dL   Calcium 9.4 8.9 - 10.3 mg/dL   Total Protein 7.9 6.5 - 8.1 g/dL   Albumin 3.7 3.5 - 5.0 g/dL   AST 18 15 - 41 U/L   ALT 15 (L) 17 - 63 U/L   Alkaline Phosphatase 80 38 - 126 U/L   Total Bilirubin 0.9 0.3 - 1.2 mg/dL   GFR calc non Af Amer >60 >60 mL/min   GFR calc Af Amer >60 >60 mL/min    Comment: (NOTE) The eGFR has been calculated using the CKD EPI equation. This calculation has not been validated in all clinical situations. eGFR's persistently <60 mL/min signify possible Chronic Kidney Disease.    Anion gap 6 5 - 15  Lipase, blood     Status: None   Collection Time: 03/16/15  4:17 PM  Result Value Ref Range   Lipase 23 11 - 51 U/L  TSH  Status: None   Collection Time: 03/16/15  4:17 PM  Result Value Ref Range   TSH 3.626  0.350 - 4.500 uIU/mL  Hemoglobin A1c     Status: None   Collection Time: 03/16/15  4:17 PM  Result Value Ref Range   Hgb A1c MFr Bld 4.8 4.0 - 6.0 %   No results found.  Review of Systems  Constitutional: Negative for fever and chills.  HENT: Negative for sore throat and tinnitus.   Eyes: Negative for blurred vision and redness.  Respiratory: Negative for cough and shortness of breath.   Cardiovascular: Negative for chest pain, palpitations, orthopnea and PND.  Gastrointestinal: Negative for nausea, vomiting, abdominal pain and diarrhea.  Genitourinary: Negative for dysuria, urgency and frequency.  Musculoskeletal: Negative for myalgias and joint pain.  Skin: Negative for rash.       No lesions  Neurological: Negative for speech change, focal weakness and weakness.  Endo/Heme/Allergies: Does not bruise/bleed easily.       No temperature intolerance  Psychiatric/Behavioral: Negative for depression and suicidal ideas.    Blood pressure 131/98, pulse 71, temperature 98.7 F (37.1 C), temperature source Oral, resp. rate 18, height _0  (1.778 m), weight 93.895 kg (207 lb), SpO2 100 %. Physical Exam  Nursing note and vitals reviewed. Constitutional: He is oriented to person, place, and time. He appears well-developed and well-nourished. No distress.  HENT:  Head: Normocephalic and atraumatic.  Mouth/Throat: Oropharynx is clear and moist.  Eyes: Conjunctivae and EOM are normal. Pupils are equal, round, and reactive to light. No scleral icterus.  Neck: Normal range of motion. Neck supple. No JVD present. No tracheal deviation present. No thyromegaly present.  Cardiovascular: Normal rate, regular rhythm and normal heart sounds.  Exam reveals no gallop and no friction rub.   No murmur heard. Respiratory: Effort normal and breath sounds normal. No respiratory distress.  GI: Soft. Bowel sounds are normal. He exhibits no distension. There is no tenderness.  Genitourinary:  Foley  catheter in place  Musculoskeletal: He exhibits no edema.  Contracture of right upper extremity and spasticity of lower extremities bilaterally  Lymphadenopathy:    He has no cervical adenopathy.  Neurological: He is alert and oriented to person, place, and time. A sensory deficit (level is approxiamtely S2) is present. No cranial nerve deficit.  Skin: Skin is warm and dry. No rash noted. No erythema.  Psychiatric: He has a normal mood and affect. His behavior is normal. Judgment and thought content normal.     Assessment/Plan This is a 55 year old male with multiple sclerosis admitted for sepsis secondary to urinary tract infection. 1. Sepsis: the patient meets criteria via tachycardia and leukocytosis.  Blood cultures were obtained in the emergency department.  The patient was initially ordered ceftriaxone but review of previous urine culture showed pseudomonas sensitize to Zosyn. Thus antibiotic coverage was changed.  He is hemodynamically stable.  Follow urine culture results for growth and sensitivities. 2. Essential hypertension: controlled; continue lisinopril and metoprolol 3. Multiple sclerosis: continue gabapentin for pain and Valium for spasticity 4. Hyperlipidemia: continue statin therapy 5. Depression: continue Prozac 6. Constipation: chronic; continue probiotic 7. DVT prophylaxis: heparin 8. GI prophylaxis: pantoprazole per home regimen The patient is a FULL CODE.  Time spent on admission orders and patient care approximately 45 minutes  Harrie Foreman, MD 03/17/2015, 2:54 AM

## 2015-03-17 NOTE — Progress Notes (Signed)
Pts BP 86/42, MD Allena Katz notified. Orders received for 250 ml/ NS fluid bolus. Will continue to monitor.

## 2015-03-17 NOTE — Progress Notes (Signed)
Pts BP 82/58 after fluid bolus.  MD Allena Katz notified orders received for continuous NS fluids at 75 ml/hr. Will continue to monitor.

## 2015-03-17 NOTE — Care Management Note (Signed)
Case Management Note  Patient Details  Name: Travis Cantrell MRN: [id number] Date of Birth: 1960/09/17  Subjective/Objective:       54yo Travis Cantrell was admitted  03/16/15 with tachycardia, elevated WBC, UTI.  Admitted to Inpatient per possible early sepsis. Hx: multiple sclerosis. Resides at home with a 24/7 caregiver. His step-daughter and sister also provide some assistance with ADLs and finances. Bed/wheelchair bound. Has a wheelchair and a hospital bed. Residence has a wheelchair ramp. Is an open client of [facility] and is receiving nursing care. Received an in-home assessment by a Child psychotherapist earlier this month and was provided with information about transportation for wheelchair bound persons with either [facility] and [facility]. Travis Cantrell has an income from SSI of approximately $1400 monthly. Case management will follow for discharge planning. Anticipate discharge home with resumption of care orders with [facility].             Action/Plan:   Expected Discharge Date:                  Expected Discharge Plan:     In-House Referral:     Discharge planning Services     Post Acute Care Choice:    Choice offered to:     DME Arranged:    DME Agency:     HH Arranged:    HH Agency:     Status of Service:     Medicare Important Message Given:    Date Medicare IM Given:    Medicare IM give by:    Date Additional Medicare IM Given:    Additional Medicare Important Message give by:     If discussed at Long Length of Stay Meetings, dates discussed:    Additional Comments:  Travis Cantrell A, RN 03/17/2015, 9:19 AM
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
## 2015-03-17 NOTE — Progress Notes (Signed)
Initial Nutrition Assessment   INTERVENTION:   Meals and Snacks: Cater to patient preferences; will recommend liberalizing diet to regular diet as pt eats well with finger foods such as hamburgers, chicken nuggets, etc. Secondary to MS and not having specialized utensils. Also note pt needing assistance at meal times currently and placing orders. Coordination of Care: if pt at risk for aspiration, recommend SLP evaulation. On last admission, pt intubated, s/p extubation on Dysphagia I, nectar thick liquids, which pt reports continuing after discharge but has been tolerating thin liquids, egg sandwiches and soft foods PTA per report. RN Ana/CNA Lafonda Mosses agreeable to monitor. On regular diet pt can pick which foods he tolerates. Pt reports no difficulty currently to RD on visit. Medical Food Supplement Therapy: Agree with Ensure Enlive po BID, each supplement provides 350 kcal and 20 grams of protein; will recommend increasing to TID if pt with inadequate po intake   NUTRITION DIAGNOSIS:   Increased nutrient needs related to wound healing as evidenced by estimated needs.  GOAL:   Patient will meet greater than or equal to 90% of their needs  MONITOR:    (Energy Intake, Electrolyte and renal Profile, Anthropometrics, Digestive system, Skin)  REASON FOR ASSESSMENT:    (Pressure Ulcer)    ASSESSMENT:   Pt admitted with sepsis secondary to UTI. Pt with h/o MS, bedbound. Pt on isolation for MRSA, with stage II pressure ulcer.  Past Medical History  Diagnosis Date  . Allergy   . GERD (gastroesophageal reflux disease)   . MS (multiple sclerosis) (HCC)   . Hypertension   . Decubitus ulcer     Diet Order:  Diet regular Room service appropriate?: Yes; Fluid consistency:: Thin    Current Nutrition: Pt reports eating a big breakfast this am without any difficulty. 50% recorded in I/O chart. RD opened Ensure on visit, which pt drank and was sipping coffee made by friend at bedside.    Food/Nutrition-Related History: On last admission 02/16/2015, pt s/p extubation from the vent was ordered Dysphagia I, Nectar thick liquids per SLP recommendations at this time. Pt reports continuing thickened liquids for a while s/p discharge at home and soft foods. Pt reports drinking Ensure daily either 1-2 times a day. Pt reports he has been eating egg sandwich for breakfast and then dinner usually is meat and potatoes or 'whatever' my caregiver makes. Pt reports only eating twice a day.   Scheduled Medications:  . atorvastatin  40 mg Oral QHS  . cholecalciferol  1,000 Units Oral Daily  . docusate sodium  100 mg Oral BID  . feeding supplement (ENSURE ENLIVE)  237 mL Oral BID WC  . FLUoxetine  20 mg Oral QPM  . FLUoxetine  40 mg Oral Daily  . gabapentin  900 mg Oral TID  . heparin  5,000 Units Subcutaneous 3 times per day  . lisinopril  10 mg Oral Daily  . metoprolol succinate  25 mg Oral Daily  . nystatin  1 g Topical QID  . pantoprazole  40 mg Oral BID  . piperacillin-tazobactam (ZOSYN)  IV  3.375 g Intravenous 3 times per day  . saccharomyces boulardii  250 mg Oral BID  . sodium chloride  250 mL Intravenous Once  . sodium chloride flush  3 mL Intravenous Q12H    Electrolyte/Renal Profile and Glucose Profile:   Recent Labs Lab 03/16/15 1617  NA 137  K 4.0  CL 106  CO2 25  BUN 31*  CREATININE 0.94  CALCIUM 9.4  GLUCOSE 149*   Protein Profile:   Recent Labs Lab 03/16/15 1617  ALBUMIN 3.7    Gastrointestinal Profile: Last BM: 03/16/2015   Nutrition-Focused Physical Exam Findings:  Unable to complete Nutrition-Focused physical exam at this time.    Weight Change: Pt reports usual weight of 200lbs and reports thinking that was his weight last week PTA. RD weighed pt again on visit at 178.6lbs. (Possible 8% weight loss possible in one month) Pt reports not thinking he was losing weight.   Skin:   (Stage II buttock pressure ulcer)  Height:   Ht Readings from  Last 1 Encounters:  03/16/15 5\' 10"  (1.778 m)    Weight:   Wt Readings from Last 1 Encounters:  03/17/15 176 lb 14.4 oz (80.241 kg)   Wt Readings from Last 10 Encounters:  03/17/15 176 lb 14.4 oz (80.241 kg)  02/23/15 207 lb (93.895 kg)  02/18/15 194 lb 1.6 oz (88.043 kg)  01/29/15 207 lb (93.895 kg)  01/03/15 215 lb (97.523 kg)  11/05/14 201 lb 11.2 oz (91.491 kg)  08/01/14 225 lb (102.059 kg)  06/18/14 174 lb 3.2 oz (79.017 kg)  06/11/13 250 lb (113.399 kg)  12/08/09 215 lb (97.523 kg)     BMI:  Body mass index is 25.38 kg/(m^2).  Estimated Nutritional Needs:   Kcal:  BEE: 1481kcals, TEE: (IF 1.0-1.2)(AF 1.2) 1777-2132kcals using IBW of 64kg  Protein:  77-96g protein (1.2-1.5g/kg) using IBW of 64kg  Fluid:  1920-2258mL of fluid (30-46mL/kg) using IBW of 64kg  EDUCATION NEEDS:   No education needs identified at this time   HIGH Care Level  Leda Quail, RD, LDN Pager [phone] Weekend/On-Call Pager [phone]

## 2015-03-18 NOTE — Progress Notes (Signed)
Infection prevention notified nurse that pt does not need to be on contact precaution. Negative MRSA PCR. Contact precautions D/C per protocol.

## 2015-03-18 NOTE — Progress Notes (Signed)
Pts BP 110/88 HR 68, MD Patel notified. Hold morning dose of Lisinopril, ok to give Metoprolol. (See MAR)

## 2015-03-18 NOTE — Progress Notes (Signed)
[facility] - [place] at [facility]   PATIENT NAME: Travis Cantrell    MR#:  [id number]  DATE OF BIRTH:  January 12, 1961  SUBJECTIVE:  Came in after he started having generalized weakness and noticed dark-colored urination at home. Patient was found to have sepsis secondary to UTI. Looks better and feels better REVIEW OF SYSTEMS:   Review of Systems  Constitutional: Negative for fever, chills and weight loss.  HENT: Negative for ear discharge, ear pain and nosebleeds.   Eyes: Negative for blurred vision, pain and discharge.  Respiratory: Negative for sputum production, shortness of breath, wheezing and stridor.   Cardiovascular: Negative for chest pain, palpitations, orthopnea and PND.  Gastrointestinal: Negative for nausea, vomiting, abdominal pain and diarrhea.  Genitourinary: Negative for urgency and frequency.  Musculoskeletal: Negative for back pain and joint pain.  Neurological: Positive for weakness. Negative for sensory change, speech change and focal weakness.  Psychiatric/Behavioral: Negative for depression and hallucinations. The patient is not nervous/anxious.   All other systems reviewed and are negative.  Tolerating Diet: Yes Tolerating PT: Bedbound due to multiple sclerosis  DRUG ALLERGIES:   Allergies  Allergen Reactions  . Baclofen Diarrhea    VITALS:  Blood pressure 114/72, pulse 77, temperature 97.4 F (36.3 C), temperature source Oral, resp. rate 18, height  (1.778 m), weight 80.513 kg (177 lb 8 oz), SpO2 99 %.  PHYSICAL EXAMINATION:   Physical Exam  GENERAL:  55 y.o.-year-old patient lying in the bed with no acute distress.  EYES: Pupils equal, round, reactive to light and accommodation. No scleral icterus. Extraocular muscles intact.  HEENT: Head atraumatic, normocephalic. Oropharynx and nasopharynx clear.  NECK:  Supple, no jugular venous distention. No thyroid enlargement, no tenderness.  LUNGS: Normal breath sounds  bilaterally, no wheezing, rales, rhonchi. No use of accessory muscles of respiration.  CARDIOVASCULAR: S1, S2 normal. No murmurs, rubs, or gallops.  ABDOMEN: Soft, nontender, nondistended. Bowel sounds present. No organomegaly or mass. Chronic indwelling Foley catheter EXTREMITIES: No cyanosis, clubbing or edema b/l.  Patient has contractors secondary to multiple sclerosis  NEUROLOGIC: Cranial nerves II through XII are intact. 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  SKIN: No obvious rash, lesion. Chronic stage II pressure sore on the buttock  LABORATORY PANEL:  CBC  Recent Labs Lab 03/16/15 1617  WBC 13.1*  HGB 14.0  HCT 43.3  PLT 284    Chemistries   Recent Labs Lab 03/16/15 1617  NA 137  K 4.0  CL 106  CO2 25  GLUCOSE 149*  BUN 31*  CREATININE 0.94  CALCIUM 9.4  AST 18  ALT 15*  ALKPHOS 80  BILITOT 0.9   Cardiac Enzymes No results for input(s): TROPONINI in the last 168 hours. RADIOLOGY:  No results found. ASSESSMENT AND PLAN:   55 year old male with multiple sclerosis admitted for sepsis secondary to urinary tract infection. 1. Sepsis recurrent UTI secondary to indwelling Foley catheter - the patient meets criteria via tachycardia and leukocytosis. - Blood cultures negative -Continue IV Zosyn. Thus antibiotic coverage was changed. He is hemodynamically stable.  -urine culture GNR  2. Essential hypertension: controlled; continue lisinopril and metoprolol 3. Multiple sclerosis: continue gabapentin for pain and Valium for spasticity 4. Hyperlipidemia: continue statin therapy 5. Depression: continue Prozac 6. Constipation: chronic; continue probiotic 7. DVT prophylaxis: Lovenox 8. GI prophylaxis: pantoprazole per home regimen Case discussed with Care Management/Social Worker. Management plans discussed with the patient,  family and they are in agreement.  CODE STATUS: Full  DVT Prophylaxis:  Lovenox  TOTAL TIME TAKING CARE OF THIS PATIENT: 35 minutes.  >50% time spent on counselling and coordination of care  POSSIBLE D/C IN one to 2 DAYS, DEPENDING ON CLINICAL CONDITION.  Note: This dictation was prepared with Dragon dictation along with smaller phrase technology. Any transcriptional errors that result from this process are unintentional.  Tymeir Weathington M.D on 03/18/2015 at 1:28 PM  Between 7am to 6pm - Pager - [phone]  After 6pm go to [email or web address] - password [email or web address] [facility]  [facility]  Office  [phone]  CC: Primary care physician; Kerby Nora, MD

## 2015-03-18 NOTE — Care Management Important Message (Signed)
Important Message  Patient Details  Name: Travis Cantrell MRN: [id number] Date of Birth: Jan 06, 1961   Medicare Important Message Given:  Yes    Ticia Virgo A, RN 03/18/2015, 8:28 AM

## 2015-03-19 MED ORDER — CEPHALEXIN 500 MG PO CAPS: 500.0000 mg | ORAL_CAPSULE | Freq: Two times a day (BID) | ORAL | Status: DC

## 2015-03-19 NOTE — Discharge Instructions (Signed)
Catheter care per protocol

## 2015-03-19 NOTE — Progress Notes (Signed)
Spoke with Thayer Ohm, [facility] rep at [phone], to notify of non-emergent EMS transport.  Auth notification reference given as [id number].   Service date range good from 03/19/15 - 06/17/15.   Gap exception requested to determine if services can be considered at an in-network level.

## 2015-03-19 NOTE — Care Management Note (Signed)
Case Management Note  Patient Details  Name: Travis Cantrell MRN: [id number] Date of Birth: Jun 29, 1960  Subjective/Objective:      Resume home health with [facility] called to [facility] at [facility]. Wound care orders acknowledged by Judeth Cornfield at [facility]. A Medical Necessity for EMS transport to home is in [name]'s shadow chart.               Action/Plan:   Expected Discharge Date:                  Expected Discharge Plan:     In-House Referral:     Discharge planning Services     Post Acute Care Choice:    Choice offered to:     DME Arranged:    DME Agency:     HH Arranged:    HH Agency:     Status of Service:     Medicare Important Message Given:  Yes Date Medicare IM Given:    Medicare IM give by:    Date Additional Medicare IM Given:    Additional Medicare Important Message give by:     If discussed at Long Length of Stay Meetings, dates discussed:    Additional Comments:  Nadiah Corbit A, RN 03/19/2015, 11:52 AM

## 2015-03-19 NOTE — Discharge Summary (Signed)
[facility] - [place] at [facility]   PATIENT NAME: Travis Cantrell    MR#:  [id number]  DATE OF BIRTH:  [birth year]  DATE OF ADMISSION:  03/16/2015 ADMITTING PHYSICIAN: Arnaldo Natal, MD  DATE OF DISCHARGE: 03/19/15  PRIMARY CARE PHYSICIAN: Kerby Nora, MD    ADMISSION DIAGNOSIS:  Urinary tract infection, acute [N39.0] Sepsis, due to unspecified organism (HCC) [A41.9]  DISCHARGE DIAGNOSIS:  Sepsis-resolved Recurrent UTI-protues mirabilis Chronic indwelling foley  MS with contractures/skin ulcers (various stages0 and bed bound  SECONDARY DIAGNOSIS:   Past Medical History  Diagnosis Date  . Allergy   . GERD (gastroesophageal reflux disease)   . MS (multiple sclerosis) (HCC)   . Hypertension   . Decubitus ulcer     HOSPITAL COURSE:   55 year old male with multiple sclerosis admitted for sepsis secondary to urinary tract infection. 1. Sepsis recurrent UTI secondary to indwelling Foley catheter - the patient meets criteria via tachycardia and leukocytosis. - Blood cultures negative -recieved IV Zosyn-- change to po keflex.  He is hemodynamically stable.  -urine culture proteus mirabilis  2. Essential hypertension: controlled; continue lisinopril and metoprolol 3. Multiple sclerosis: continue gabapentin for pain and Valium for spasticity 4. Hyperlipidemia: continue statin therapy 5. Depression: continue Prozac 6. Constipation: chronic; continue probiotic 7. DVT prophylaxis: Lovenox  Overall improved. D/c home today/. Pt will go via emS CONSULTS OBTAINED:     DRUG ALLERGIES:   Allergies  Allergen Reactions  . Baclofen Diarrhea    DISCHARGE MEDICATIONS:   Current Discharge Medication List    START taking these medications   Details  cephALEXin (KEFLEX) 500 MG capsule Take 1 capsule (500 mg total) by mouth every 12 (twelve) hours. Qty: 12 capsule, Refills: 0      CONTINUE these medications which have NOT CHANGED   Details   albuterol (PROVENTIL) (2.5 MG/3ML) 0.083% nebulizer solution Take 3 mLs (2.5 mg total) by nebulization every 4 (four) hours as needed for wheezing or shortness of breath. Qty: 75 mL, Refills: 12    atorvastatin (LIPITOR) 40 MG tablet Take 40 mg by mouth at bedtime.    cholecalciferol (VITAMIN D) 1000 units tablet Take 1,000 Units by mouth daily.    diazepam (VALIUM) 5 MG tablet Take 5 mg by mouth every 12 (twelve) hours as needed for anxiety.    docusate sodium (COLACE) 100 MG capsule Take 1 capsule (100 mg total) by mouth 2 (two) times daily. Qty: 10 capsule, Refills: 0    feeding supplement, ENSURE ENLIVE, (ENSURE ENLIVE) LIQD Take 237 mLs by mouth 2 (two) times daily with a meal. Qty: 237 mL, Refills: 12    FLUoxetine (PROZAC) 20 MG capsule Take 20-40 mg by mouth 2 (two) times daily. Pt takes two capsules in the morning and one in the evening.    gabapentin (NEURONTIN) 300 MG capsule Take 3 capsules (900 mg total) by mouth 3 (three) times daily. Qty: 360 capsule, Refills: 0    ibuprofen (ADVIL,MOTRIN) 800 MG tablet Take 800 mg by mouth every 8 (eight) hours as needed for mild pain.    lisinopril (PRINIVIL,ZESTRIL) 10 MG tablet Take 10 mg by mouth daily.    metoprolol succinate (TOPROL-XL) 25 MG 24 hr tablet Take 25 mg by mouth daily.    nystatin (MYCOSTATIN) powder Apply 1 g topically 4 (four) times daily.    omeprazole (PRILOSEC) 20 MG capsule Take 20 mg by mouth 2 (two) times daily.    oxybutynin (DITROPAN) 5 MG tablet Take  1 tablet (5 mg total) by mouth every 8 (eight) hours as needed for bladder spasms. Qty: 90 tablet, Refills: 3    saccharomyces boulardii (FLORASTOR) 250 MG capsule Take 1 capsule (250 mg total) by mouth 2 (two) times daily. Qty: 20 capsule, Refills: 0      STOP taking these medications     ciprofloxacin (CIPRO) 500 MG tablet         If you experience worsening of your admission symptoms, develop shortness of breath, life threatening emergency,  suicidal or homicidal thoughts you must seek medical attention immediately by calling 911 or calling your MD immediately  if symptoms less severe.  You Must read complete instructions/literature along with all the possible adverse reactions/side effects for all the Medicines you take and that have been prescribed to you. Take any new Medicines after you have completely understood and accept all the possible adverse reactions/side effects.   Please note  You were cared for by a hospitalist during your hospital stay. If you have any questions about your discharge medications or the care you received while you were in the hospital after you are discharged, you can call the unit and asked to speak with the hospitalist on call if the hospitalist that took care of you is not available. Once you are discharged, your primary care physician will handle any further medical issues. Please note that NO REFILLS for any discharge medications will be authorized once you are discharged, as it is imperative that you return to your primary care physician (or establish a relationship with a primary care physician if you do not have one) for your aftercare needs so that they can reassess your need for medications and monitor your lab values. Today   SUBJECTIVE   No complaints  VITAL SIGNS:  Blood pressure 118/87, pulse 55, temperature 99.1 F (37.3 C), temperature source Oral, resp. rate 15, height 5\' 10"  (1.778 m), weight 85.681 kg (188 lb 14.3 oz), SpO2 99 %.  I/O:   Intake/Output Summary (Last 24 hours) at 03/19/15 1053 Last data filed at 03/19/15 0949  Gross per 24 hour  Intake    580 ml  Output    675 ml  Net    -95 ml    PHYSICAL EXAMINATION:  GENERAL:  55 y.o.-year-old patient lying in the bed with no acute distress.  EYES: Pupils equal, round, reactive to light and accommodation. No scleral icterus. Extraocular muscles intact.  HEENT: Head atraumatic, normocephalic. Oropharynx and nasopharynx  clear.  NECK:  Supple, no jugular venous distention. No thyroid enlargement, no tenderness.  LUNGS: Normal breath sounds bilaterally, no wheezing, rales,rhonchi or crepitation. No use of accessory muscles of respiration.  CARDIOVASCULAR: S1, S2 normal. No murmurs, rubs, or gallops.  ABDOMEN: Soft, non-tender, non-distended. Bowel sounds present. No organomegaly or mass.  EXTREMITIES: No pedal edema, cyanosis, or clubbing. Severe contractures and skin tears/superficial ulcers due to bed bound status NEUROLOGIC: Cranial nerves II through XII are intact. Muscle strength 5/5 in all extremities. Sensation intact. Gait not checked.  PSYCHIATRIC: The patient is alert and oriented x 3.  SKIN: as above  DATA REVIEW:   CBC   Recent Labs Lab 03/16/15 1617  WBC 13.1*  HGB 14.0  HCT 43.3  PLT 284    Chemistries   Recent Labs Lab 03/16/15 1617  NA 137  K 4.0  CL 106  CO2 25  GLUCOSE 149*  BUN 31*  CREATININE 0.94  CALCIUM 9.4  AST 18  ALT 15*  ALKPHOS 80  BILITOT 0.9    Microbiology Results   Recent Results (from the past 240 hour(s))  Urine culture     Status: None (Preliminary result)   Collection Time: 03/16/15  4:16 PM  Result Value Ref Range Status   Specimen Description URINE, CATHETERIZED  Final   Special Requests Immunocompromised  Final   Culture   Final    >=100,000 COLONIES/mL PROTEUS MIRABILIS HOLDING FOR ADDITIONAL POSSIBLE PATHOGEN    Report Status PENDING  Incomplete   Organism ID, Bacteria PROTEUS MIRABILIS  Final      Susceptibility   Proteus mirabilis - MIC*    AMPICILLIN 16 INTERMEDIATE Intermediate     CEFAZOLIN 8 SENSITIVE Sensitive     CEFTRIAXONE <=1 SENSITIVE Sensitive     CIPROFLOXACIN >=4 RESISTANT Resistant     GENTAMICIN <=1 SENSITIVE Sensitive     IMIPENEM 8 INTERMEDIATE Intermediate     NITROFURANTOIN 256 RESISTANT Resistant     TRIMETH/SULFA 160 RESISTANT Resistant     AMPICILLIN/SULBACTAM 16 INTERMEDIATE Intermediate     PIP/TAZO  <=4 SENSITIVE Sensitive     * >=100,000 COLONIES/mL PROTEUS MIRABILIS  Culture, blood (Routine X 2) w Reflex to ID Panel     Status: None (Preliminary result)   Collection Time: 03/16/15  5:51 PM  Result Value Ref Range Status   Specimen Description BLOOD  Final   Special Requests NONE  Final   Culture NO GROWTH 3 DAYS  Final   Report Status PENDING  Incomplete  Culture, blood (Routine X 2) w Reflex to ID Panel     Status: None (Preliminary result)   Collection Time: 03/16/15  5:51 PM  Result Value Ref Range Status   Specimen Description BLOOD  Final   Special Requests NONE  Final   Culture NO GROWTH 3 DAYS  Final   Report Status PENDING  Incomplete  MRSA PCR Screening     Status: None   Collection Time: 03/17/15  9:32 AM  Result Value Ref Range Status   MRSA by PCR NEGATIVE NEGATIVE Final    Comment:        The GeneXpert MRSA Assay (FDA approved for NASAL specimens only), is one component of a comprehensive MRSA colonization surveillance program. It is not intended to diagnose MRSA infection nor to guide or monitor treatment for MRSA infections.     RADIOLOGY:  No results found.   Management plans discussed with the patient, family and they are in agreement.  CODE STATUS:     Code Status Orders        Start     Ordered   03/16/15 1946  Full code   Continuous     03/16/15 1945    Code Status History    Date Active Date Inactive Code Status Order ID Comments User Context   02/15/2015  9:30 PM 02/18/2015  6:34 PM Full Code [id number]  Arrie Eastern, RN Inpatient   01/29/2015  9:46 AM 02/02/2015  6:20 PM Full Code [id number]  Ihor Austin, MD Inpatient   11/03/2014  8:04 AM 11/05/2014  2:36 PM Full Code [id number]  Arnaldo Natal, MD Inpatient   08/01/2014  3:18 PM 08/04/2014  6:26 PM Full Code [id number]  Katha Hamming, MD ED   06/15/2014  1:36 AM 06/18/2014  8:33 PM Full Code [id number]  Arnaldo Natal, MD ED      TOTAL TIME TAKING CARE OF THIS PATIENT: 40  minutes.    Vance Hochmuth M.D on 03/19/2015 at 10:53  AM  Between 7am to 6pm - Pager - [phone] After 6pm go to [email or web address] - password [email or web address] [facility]  [place] [facility]  Office  [phone]  CC: Primary care physician; Kerby Nora, MD

## 2015-03-19 NOTE — Consult Note (Addendum)
Pharmacy Antibiotic Note  Travis Cantrell is a 55 y.o. male admitted on 03/16/2015 with sepsis and UTI.  Pharmacy has been consulted for zosyn dosing. Previous urine cultures for a previous encounter grew Pseudomonas   Plan: Will continue Zosyn 3.375 g IV q8 hours for now. Will discuss with MD about DOT during MD/PharmD clinical rounding.   Height: 5\' 10"  (177.8 cm) Weight: 188 lb 14.3 oz (85.681 kg) IBW/kg (Calculated) : 73  Temp (24hrs), Avg:98.2 F (36.8 C), Min:97.1 F (36.2 C), Max:99.1 F (37.3 C)   Recent Labs Lab 03/16/15 1616 03/16/15 1617  WBC  --  13.1*  CREATININE  --  0.94  LATICACIDVEN 0.9  --     Estimated Creatinine Clearance: 92.8 mL/min (by C-G formula based on Cr of 0.94).    Allergies  Allergen Reactions  . Baclofen Diarrhea    Antimicrobials this admission: zosyn 2/20 >>    Dose adjustments this admission:   Microbiology results:  2/20 UCx: 100 k CFU GNR; identification pending    Thank you for allowing pharmacy to be a part of this patient's care.  Irva Loser D 03/19/2015 9:28 AM

## 2015-03-20 ENCOUNTER — Telehealth: Payer: Self-pay

## 2015-03-20 ENCOUNTER — Other Ambulatory Visit: Payer: Self-pay | Admitting: Family Medicine

## 2015-03-20 DIAGNOSIS — L89322 Pressure ulcer of left buttock, stage 2: Secondary | ICD-10-CM | POA: Diagnosis not present

## 2015-03-20 DIAGNOSIS — G8220 Paraplegia, unspecified: Secondary | ICD-10-CM | POA: Diagnosis not present

## 2015-03-20 DIAGNOSIS — I10 Essential (primary) hypertension: Secondary | ICD-10-CM | POA: Diagnosis not present

## 2015-03-20 DIAGNOSIS — G35 Multiple sclerosis: Secondary | ICD-10-CM | POA: Diagnosis not present

## 2015-03-20 DIAGNOSIS — N390 Urinary tract infection, site not specified: Secondary | ICD-10-CM | POA: Diagnosis not present

## 2015-03-20 DIAGNOSIS — R6510 Systemic inflammatory response syndrome (SIRS) of non-infectious origin without acute organ dysfunction: Secondary | ICD-10-CM | POA: Diagnosis not present

## 2015-03-20 DIAGNOSIS — L89312 Pressure ulcer of right buttock, stage 2: Secondary | ICD-10-CM | POA: Diagnosis not present

## 2015-03-20 DIAGNOSIS — Z436 Encounter for attention to other artificial openings of urinary tract: Secondary | ICD-10-CM | POA: Diagnosis not present

## 2015-03-20 LAB — URINE CULTURE

## 2015-03-20 NOTE — Telephone Encounter (Signed)
Diazepam called into [facility] [address] 

## 2015-03-20 NOTE — Telephone Encounter (Signed)
Last office visit 07/24/2014.  Patient is currently in hospital.  Last Lipid 11/19/2013.  Ok to refill?

## 2015-03-20 NOTE — Telephone Encounter (Signed)
Initial TCM call - attempted to reach pt but VM has not been setup. Contacted mobile phone number listed. This belongs to his sister who advised me to contact the pt on the primary home phone listed. Pt did not answer call.

## 2015-03-21 DIAGNOSIS — L8992 Pressure ulcer of unspecified site, stage 2: Secondary | ICD-10-CM | POA: Diagnosis not present

## 2015-03-21 DIAGNOSIS — J9690 Respiratory failure, unspecified, unspecified whether with hypoxia or hypercapnia: Secondary | ICD-10-CM | POA: Diagnosis not present

## 2015-03-21 LAB — CULTURE, BLOOD (ROUTINE X 2)
Culture: NO GROWTH
Culture: NO GROWTH

## 2015-03-23 DIAGNOSIS — S31829A Unspecified open wound of left buttock, initial encounter: Secondary | ICD-10-CM | POA: Diagnosis not present

## 2015-03-23 DIAGNOSIS — S31819A Unspecified open wound of right buttock, initial encounter: Secondary | ICD-10-CM | POA: Diagnosis not present

## 2015-03-26 ENCOUNTER — Telehealth: Payer: Self-pay | Admitting: Family Medicine

## 2015-03-26 ENCOUNTER — Ambulatory Visit: Payer: Self-pay | Admitting: Family Medicine

## 2015-03-26 NOTE — Telephone Encounter (Signed)
Noted  

## 2015-03-26 NOTE — Telephone Encounter (Signed)
Patient had a hospital follow up scheduled with Dr.Bedsole tomorrow.  Patient called and said he couldn't afford the transportation to come.  They were going to charge him $110 to come here.  Patient said he'll try and get a ride and reschedule appointment next week.  Patient said he's feeling all right.

## 2015-03-27 ENCOUNTER — Ambulatory Visit: Payer: Self-pay | Admitting: Family Medicine

## 2015-03-27 DIAGNOSIS — L89312 Pressure ulcer of right buttock, stage 2: Secondary | ICD-10-CM | POA: Diagnosis not present

## 2015-03-27 DIAGNOSIS — G8220 Paraplegia, unspecified: Secondary | ICD-10-CM | POA: Diagnosis not present

## 2015-03-27 DIAGNOSIS — I10 Essential (primary) hypertension: Secondary | ICD-10-CM | POA: Diagnosis not present

## 2015-03-27 DIAGNOSIS — R6510 Systemic inflammatory response syndrome (SIRS) of non-infectious origin without acute organ dysfunction: Secondary | ICD-10-CM | POA: Diagnosis not present

## 2015-03-27 DIAGNOSIS — G35 Multiple sclerosis: Secondary | ICD-10-CM | POA: Diagnosis not present

## 2015-03-27 DIAGNOSIS — Z436 Encounter for attention to other artificial openings of urinary tract: Secondary | ICD-10-CM | POA: Diagnosis not present

## 2015-03-27 DIAGNOSIS — L89322 Pressure ulcer of left buttock, stage 2: Secondary | ICD-10-CM | POA: Diagnosis not present

## 2015-03-27 DIAGNOSIS — N390 Urinary tract infection, site not specified: Secondary | ICD-10-CM | POA: Diagnosis not present

## 2015-03-31 DIAGNOSIS — G35 Multiple sclerosis: Secondary | ICD-10-CM | POA: Diagnosis not present

## 2015-03-31 DIAGNOSIS — N390 Urinary tract infection, site not specified: Secondary | ICD-10-CM | POA: Diagnosis not present

## 2015-03-31 DIAGNOSIS — L89322 Pressure ulcer of left buttock, stage 2: Secondary | ICD-10-CM | POA: Diagnosis not present

## 2015-03-31 DIAGNOSIS — R6510 Systemic inflammatory response syndrome (SIRS) of non-infectious origin without acute organ dysfunction: Secondary | ICD-10-CM | POA: Diagnosis not present

## 2015-03-31 DIAGNOSIS — L89312 Pressure ulcer of right buttock, stage 2: Secondary | ICD-10-CM | POA: Diagnosis not present

## 2015-03-31 DIAGNOSIS — Z436 Encounter for attention to other artificial openings of urinary tract: Secondary | ICD-10-CM | POA: Diagnosis not present

## 2015-03-31 DIAGNOSIS — G8220 Paraplegia, unspecified: Secondary | ICD-10-CM | POA: Diagnosis not present

## 2015-03-31 DIAGNOSIS — I10 Essential (primary) hypertension: Secondary | ICD-10-CM | POA: Diagnosis not present

## 2015-04-10 ENCOUNTER — Telehealth (INDEPENDENT_AMBULATORY_CARE_PROVIDER_SITE_OTHER): Payer: Medicare Other | Admitting: Family Medicine

## 2015-04-10 DIAGNOSIS — N390 Urinary tract infection, site not specified: Secondary | ICD-10-CM | POA: Diagnosis not present

## 2015-04-10 MED ORDER — CEPHALEXIN 500 MG PO CAPS: 500.0000 mg | ORAL_CAPSULE | Freq: Four times a day (QID) | ORAL | Status: DC

## 2015-04-10 NOTE — Telephone Encounter (Signed)
Call pt.. He needs to have home health to have a sample taken if he cannot come into office. Can we set this back up. Can someone bring a sample by here today from him?

## 2015-04-10 NOTE — Telephone Encounter (Signed)
Tried to call patient, voice mail is not set up.

## 2015-04-10 NOTE — Telephone Encounter (Signed)
I spoke with Travis Cantrell at [facility]; pt was discharged from [facility] on 03/31/15 due to pt refusing further services.Travis Cantrell said if Dr Ermalene Searing wanted Home health would need to do new referral; intake phone # [phone] and fax # [phone]. Please advise.

## 2015-04-10 NOTE — Telephone Encounter (Signed)
Step daughter called and requested cb about urinary problem.

## 2015-04-10 NOTE — Telephone Encounter (Signed)
Patient notified as instructed by telephone and verbalized understanding. Patient stated that he knows that he has an infection because the urine is thick with mucus. Patient stated that the catheter was just put in 2 weeks ago by his stepdaughter and he knows that it is placed right. Patient stated that he was told at the hospital that he does not have to replace the catheter for 4 weeks. Patient stated that the nurse from [facility] told him that they wanted to release him and he said okay. Patient stated that he does not think that he needs the home health agency because anyone can put in a catheter.

## 2015-04-10 NOTE — Telephone Encounter (Signed)
Travis Cantrell, Museum/gallery conservator, is a Charity fundraiser. She has been going to clean him daily. She has checked the catheter placement last weekend. He used to go to [facility], but he has transportation issues. She will try to get a sample up here next week. They will go to an ER if it gets bad this weekend. He would need a new order for Home Health for catheter care. She is asking if we can start a new order to [facility].

## 2015-04-10 NOTE — Telephone Encounter (Signed)
Patient Name: Travis Cantrell  DOB: [birth year]    Initial Comment caller states urine is bypassing cath   Nurse Assessment  Nurse: Sherilyn Cooter, RN, Travis Cantrell Date/Time Travis Cantrell Time): 04/10/2015 12:48:04 PM  Confirm and document reason for call. If symptomatic, describe symptoms. You must click the next button to save text entered. ---[name] states that his urine is bypassing his catheter beginning last night. He states that he has had a catheter for the last couple years. It was changed about 2 weeks ago by his step-daughter who is going to school to be a Engineer, civil (consulting). He denies fever and pain. He denies blood.  Has the patient traveled out of the country within the last 30 days? ---No  Does the patient have any new or worsening symptoms? ---Yes  Will a triage be completed? ---Yes  Related visit to physician within the last 2 weeks? ---No  Does the PT have any chronic conditions? (i.e. diabetes, asthma, etc.) ---Yes  List chronic conditions. ---MS  Is this a behavioral health or substance abuse call? ---No     Guidelines    Guideline Title Affirmed Question Affirmed Notes  Urinary Catheter Symptoms and Questions [1] Cloudy urine lasts > 24 hours AND [2] not cleared by increasing fluid intake    Final Disposition User   See Physician within 24 Hours Sherilyn Cooter, RN, Travis Cantrell    Comments  [name] states that his urine is cloudy and he is drinking plenty of fluids.  I called the backline and spoke with Lynnea Ferrier who transferred me to [place]. Information given. She will check into why the home health is no longer coming out and will check with Dr. Ermalene Searing to see what needs to be done for him. Someone will be calling him back. [name] advised. Verbalized understanding.   Referrals  GO TO FACILITY UNDECIDED   Disagree/Comply: Disagree  Disagree/Comply Reason: Unable to find transportation

## 2015-04-10 NOTE — Telephone Encounter (Signed)
Initial message did not sound like and infection but that urine was " bypassing catheter" ie catheter problem, but pt last info sounds like possible infection. No confusion, fever.   Pt with frequent multidrug resistant catheter based UTI/sepsis,, excessive ER visits given he has no transportation and just  Goes to ER with symptoms Spoke with step daughter... She will obtain and I/out cathor midstream sample with catheter removed to refrigerate until bringing in Monday.  I will send  Antibiotics ( keflex) for pt to start after urine sample obtained. ( Last UTI 03/16/2015 proteus resistant to cipro, bactrim, nitrofurantoin) . Pt notified through granddaughter.   There is no clear benefit to using either antibiotic-coated urinary catheters or prophylactic antibiotics to reduce the risk of catheter associated urinary tract infection.   Will call urologist next week about suprapubic cath plcement and any pother ways to decrease issue.  Does not qualify for [facility] given not correct diagnosis.

## 2015-04-10 NOTE — Telephone Encounter (Signed)
Correction.. Cloudy urine to be expected with catheter. Does not sound like UA sample needed, just someone to check catheter placement. Sounds like he still needs Home health.. Is pt agreeable? Or can familty member replace catheter?

## 2015-04-14 ENCOUNTER — Emergency Department (HOSPITAL_COMMUNITY)
Admission: EM | Admit: 2015-04-14 | Discharge: 2015-04-14 | Disposition: A | Discharge status: Home / Self Care | Payer: Medicare Other | Attending: Emergency Medicine | Admitting: Emergency Medicine

## 2015-04-14 ENCOUNTER — Emergency Department (HOSPITAL_COMMUNITY): Payer: Medicare Other

## 2015-04-14 ENCOUNTER — Telehealth: Payer: Self-pay | Admitting: Family Medicine

## 2015-04-14 ENCOUNTER — Encounter (HOSPITAL_COMMUNITY): Payer: Self-pay

## 2015-04-14 ENCOUNTER — Ambulatory Visit: Payer: Self-pay | Admitting: Family Medicine

## 2015-04-14 DIAGNOSIS — Z960 Presence of urogenital implants: Secondary | ICD-10-CM

## 2015-04-14 DIAGNOSIS — I10 Essential (primary) hypertension: Secondary | ICD-10-CM | POA: Diagnosis not present

## 2015-04-14 DIAGNOSIS — A4159 Other Gram-negative sepsis: Secondary | ICD-10-CM

## 2015-04-14 DIAGNOSIS — G35 Multiple sclerosis: Secondary | ICD-10-CM

## 2015-04-14 DIAGNOSIS — R52 Pain, unspecified: Secondary | ICD-10-CM | POA: Diagnosis not present

## 2015-04-14 DIAGNOSIS — Z792 Long term (current) use of antibiotics: Secondary | ICD-10-CM | POA: Insufficient documentation

## 2015-04-14 DIAGNOSIS — A414 Sepsis due to anaerobes: Secondary | ICD-10-CM

## 2015-04-14 DIAGNOSIS — Z8669 Personal history of other diseases of the nervous system and sense organs: Secondary | ICD-10-CM | POA: Diagnosis not present

## 2015-04-14 DIAGNOSIS — Z872 Personal history of diseases of the skin and subcutaneous tissue: Secondary | ICD-10-CM | POA: Insufficient documentation

## 2015-04-14 DIAGNOSIS — K219 Gastro-esophageal reflux disease without esophagitis: Secondary | ICD-10-CM | POA: Diagnosis not present

## 2015-04-14 DIAGNOSIS — Z79899 Other long term (current) drug therapy: Secondary | ICD-10-CM | POA: Diagnosis not present

## 2015-04-14 DIAGNOSIS — R519 Headache, unspecified: Secondary | ICD-10-CM

## 2015-04-14 DIAGNOSIS — R51 Headache: Secondary | ICD-10-CM | POA: Diagnosis not present

## 2015-04-14 DIAGNOSIS — N390 Urinary tract infection, site not specified: Secondary | ICD-10-CM

## 2015-04-14 LAB — POC URINALSYSI DIPSTICK (AUTOMATED)
Bilirubin, UA: NEGATIVE
Glucose, UA: NEGATIVE
NITRITE UA: POSITIVE
PH UA: 7
Spec Grav, UA: 1.02
UROBILINOGEN UA: 0.2

## 2015-04-14 LAB — BASIC METABOLIC PANEL
Anion gap: 11 (ref 5–15)
BUN: 32 mg/dL — ABNORMAL HIGH (ref 6–20)
CHLORIDE: 108 mmol/L (ref 101–111)
CO2: 24 mmol/L (ref 22–32)
CREATININE: 1.32 mg/dL — AB (ref 0.61–1.24)
Calcium: 9.1 mg/dL (ref 8.9–10.3)
GFR, EST NON AFRICAN AMERICAN: 60 mL/min — AB (ref >60)
Glucose, Bld: 125 mg/dL — ABNORMAL HIGH (ref 65–99)
POTASSIUM: 4.4 mmol/L (ref 3.5–5.1)
SODIUM: 143 mmol/L (ref 135–145)

## 2015-04-14 LAB — CBC WITH DIFFERENTIAL/PLATELET
BASOS ABS: 0.1 10*3/uL (ref 0.0–0.1)
BASOS PCT: 0 %
EOS ABS: 0.3 10*3/uL (ref 0.0–0.7)
Eosinophils Relative: 3 %
HCT: 40.7 % (ref 39.0–52.0)
HEMOGLOBIN: 12.4 g/dL — AB (ref 13.0–17.0)
LYMPHS ABS: 3.3 10*3/uL (ref 0.7–4.0)
Lymphocytes Relative: 25 %
MCH: 25.3 pg — AB (ref 26.0–34.0)
MCHC: 30.5 g/dL (ref 30.0–36.0)
MCV: 82.9 fL (ref 78.0–100.0)
Monocytes Absolute: 0.5 10*3/uL (ref 0.1–1.0)
Monocytes Relative: 4 %
NEUTROS PCT: 69 %
Neutro Abs: 9.1 10*3/uL — ABNORMAL HIGH (ref 1.7–7.7)
Platelets: 339 10*3/uL (ref 150–400)
RBC: 4.91 MIL/uL (ref 4.22–5.81)
RDW: 15.1 % (ref 11.5–15.5)
WBC: 13.3 10*3/uL — AB (ref 4.0–10.5)

## 2015-04-14 MED ORDER — SODIUM CHLORIDE 0.9 % IV BOLUS (SEPSIS): 500.0000 mL | Freq: Once | INTRAVENOUS | Status: DC

## 2015-04-14 MED ORDER — KETOROLAC TROMETHAMINE 30 MG/ML IJ SOLN
30.0000 mg | Freq: Once | INTRAMUSCULAR | Status: AC
  Administered 2015-04-14: 30 mg via INTRAVENOUS
  Filled 2015-04-14: qty 1

## 2015-04-14 MED ORDER — METHYLPREDNISOLONE SODIUM SUCC 125 MG IJ SOLR
125.0000 mg | Freq: Once | INTRAMUSCULAR | Status: AC
Start: 2015-04-14 — End: 2015-04-14
  Administered 2015-04-14: 125 mg via INTRAVENOUS
  Filled 2015-04-14: qty 2

## 2015-04-14 MED ORDER — DIPHENHYDRAMINE HCL 50 MG/ML IJ SOLN
25.0000 mg | Freq: Once | INTRAMUSCULAR | Status: AC
  Administered 2015-04-14: 25 mg via INTRAVENOUS
  Filled 2015-04-14: qty 1

## 2015-04-14 MED ORDER — PROCHLORPERAZINE EDISYLATE 5 MG/ML IJ SOLN
10.0000 mg | Freq: Once | INTRAMUSCULAR | Status: AC
  Administered 2015-04-14: 10 mg via INTRAVENOUS
  Filled 2015-04-14: qty 2

## 2015-04-14 MED ORDER — SODIUM CHLORIDE 0.9 % IV BOLUS (SEPSIS)
500.0000 mL | Freq: Once | INTRAVENOUS | Status: AC
  Administered 2015-04-14: 500 mL via INTRAVENOUS

## 2015-04-14 MED ORDER — BUTALBITAL-APAP-CAFFEINE 50-325-40 MG PO TABS: 1.0000 | ORAL_TABLET | Freq: Four times a day (QID) | ORAL | Status: DC | PRN

## 2015-04-14 MED ORDER — SODIUM CHLORIDE 0.9 % IV BOLUS (SEPSIS)
1000.0000 mL | Freq: Once | INTRAVENOUS | Status: AC
  Administered 2015-04-14: 1000 mL via INTRAVENOUS

## 2015-04-14 NOTE — ED Notes (Signed)
Patient transported to CT 

## 2015-04-14 NOTE — ED Notes (Signed)
Patient arrived by [facility] from home with left temporal headache since 0400. Reports he has had headache the past 2 days that resolved with excedrin and today it did not resolve. No vomiting, denies trauma. Received zofran  IV pta

## 2015-04-14 NOTE — Addendum Note (Signed)
Addended by: Damita Lack on: 04/14/2015 03:57 PM   Modules accepted: Orders

## 2015-04-14 NOTE — ED Notes (Addendum)
Patient has MS and bedridden for same. Has chronic foley and was changed yesterday, urine clear. Patient has not had daily BP meds this am. Pupils equal. Patient able to follow commands.

## 2015-04-14 NOTE — ED Notes (Signed)
[facility] called to transport back to home, patient non-ambulatory

## 2015-04-14 NOTE — ED Provider Notes (Signed)
CSN: [id number]     Arrival date & time 04/14/15  9381 History   First MD Initiated Contact with Patient 04/14/15 [phone]     Chief Complaint  Patient presents with  . Headache     (Consider location/radiation/quality/duration/timing/severity/associated sxs/prior Treatment) The history is provided by the patient.     Pt presents with intermittent headache for the past two days.  Headache began gradually in the left temple, is throbbing in nature.  Relieved temporarily with excedrine migraine then returned.  Has had associated nausea.  Has recently had problems with his catheter that he believes was related to infection.  He changed out his catheter for a larger one yesterday and has been on antibiotics, thinks everything is improving.  He has otherwise had no sick symptoms.  Denies fevers, chills, myalgias.  Is at his baseline neurologically and reports no changes to his MS - he is able to move his left hand only.  Denies visual changes, problems with swallowing or phonation, weakness or numbness of the face.   Only medication change is recent addition of antibiotic for presumed UTI.  Pt denies any head trauma or recent falls.    Past Medical History  Diagnosis Date  . Allergy   . GERD (gastroesophageal reflux disease)   . MS (multiple sclerosis) ([place])   . Hypertension   . Decubitus ulcer    Past Surgical History  Procedure Laterality Date  . Vein reconsruction    . Rotator cuff repair     Family History  Problem Relation Age of Onset  . Cancer Mother     multiple myeloma  . Diabetes Father   . Heart attack Father    Social History  Substance Use Topics  . Smoking status: Never Smoker   . Smokeless tobacco: Never Used  . Alcohol Use: No    Review of Systems  All other systems reviewed and are negative.     Allergies  Baclofen  Home Medications   Prior to Admission medications   Medication Sig Start Date End Date Taking? Authorizing Provider  albuterol (PROVENTIL)  (2.5 MG/3ML) 0.083% nebulizer solution Take 3 mLs (2.5 mg total) by nebulization every 4 (four) hours as needed for wheezing or shortness of breath. 02/18/15   Epifanio Lesches, MD  atorvastatin (LIPITOR) 40 MG tablet Take 40 mg by mouth at bedtime.    Historical Provider, MD  atorvastatin (LIPITOR) 40 MG tablet TAKE ONE TABLET BY MOUTH ONCE DAILY 03/20/15   Amy Cletis Athens, MD  cephALEXin (KEFLEX) 500 MG capsule Take 1 capsule (500 mg total) by mouth every 12 (twelve) hours. 03/19/15   Fritzi Mandes, MD  cephALEXin (KEFLEX) 500 MG capsule Take 1 capsule (500 mg total) by mouth 4 (four) times daily. Use if symptoms of UTI begin. Call if not improving in 48-72 hours. 04/10/15 04/20/15  Amy Cletis Athens, MD  cholecalciferol (VITAMIN D) 1000 units tablet Take 1,000 Units by mouth daily.    Historical Provider, MD  diazepam (VALIUM) 5 MG tablet TAKE ONE TABLET BY MOUTH EVERY 12 HOURS 03/20/15   Amy Cletis Athens, MD  docusate sodium (COLACE) 100 MG capsule Take 1 capsule (100 mg total) by mouth 2 (two) times daily. 06/18/14   Nicholes Mango, MD  feeding supplement, ENSURE ENLIVE, (ENSURE ENLIVE) LIQD Take 237 mLs by mouth 2 (two) times daily with a meal. 08/04/14   Nicholes Mango, MD  FLUoxetine (PROZAC) 20 MG capsule Take 20-40 mg by mouth 2 (two) times daily. Pt takes two  capsules in the morning and one in the evening.    Historical Provider, MD  gabapentin (NEURONTIN) 300 MG capsule Take 3 capsules (900 mg total) by mouth 3 (three) times daily. 03/12/15   Amy Cletis Athens, MD  ibuprofen (ADVIL,MOTRIN) 800 MG tablet Take 800 mg by mouth every 8 (eight) hours as needed for mild pain.    Historical Provider, MD  lisinopril (PRINIVIL,ZESTRIL) 10 MG tablet Take 10 mg by mouth daily.    Historical Provider, MD  metoprolol succinate (TOPROL-XL) 25 MG 24 hr tablet Take 25 mg by mouth daily.    Historical Provider, MD  nystatin (MYCOSTATIN) powder Apply 1 g topically 4 (four) times daily.    Historical Provider, MD  omeprazole (PRILOSEC)  20 MG capsule Take 20 mg by mouth 2 (two) times daily.    Historical Provider, MD  oxybutynin (DITROPAN) 5 MG tablet Take 1 tablet (5 mg total) by mouth every 8 (eight) hours as needed for bladder spasms. 10/17/14   Amy Cletis Athens, MD  saccharomyces boulardii (FLORASTOR) 250 MG capsule Take 1 capsule (250 mg total) by mouth 2 (two) times daily. 06/18/14   Nicholes Mango, MD   BP 160/88 mmHg  Pulse 59  Temp(Src) 97.5 F (36.4 C) (Oral)  Resp 20  SpO2 100% Physical Exam  Constitutional: He appears well-developed and well-nourished. No distress.  HENT:  Head: Normocephalic and atraumatic.  No tenderness over temporal region  Neck: Neck supple.  Cardiovascular: Normal rate and regular rhythm.   Pulmonary/Chest: Effort normal and breath sounds normal. No respiratory distress. He has no wheezes. He has no rales.  Abdominal: Soft. He exhibits no distension and no mass. There is no tenderness. There is no rebound and no guarding.  Genitourinary:  Foley catheter in place draining dark yellow clear urine  Neurological: He is alert. He exhibits normal muscle tone.  Cranial nerves intact.  Left hand grip strength intact.  Extremities otherwise chronically immobilized due to MS, unchanged.  Sensation intact.    Skin: He is not diaphoretic.  Nursing note and vitals reviewed.   ED Course  Procedures (including critical care time) Labs Review Labs Reviewed  BASIC METABOLIC PANEL - Abnormal; Notable for the following:    Glucose, Bld 125 (*)    BUN 32 (*)    Creatinine, Ser 1.32 (*)    GFR calc non Af Amer 60 (*)    All other components within normal limits  CBC WITH DIFFERENTIAL/PLATELET - Abnormal; Notable for the following:    WBC 13.3 (*)    Hemoglobin 12.4 (*)    MCH 25.3 (*)    Neutro Abs 9.1 (*)    All other components within normal limits    Imaging Review Ct Head Wo Contrast  04/14/2015  CLINICAL DATA:  Severe headache today EXAM: CT HEAD WITHOUT CONTRAST TECHNIQUE: Contiguous axial  images were obtained from the base of the skull through the vertex without intravenous contrast. COMPARISON:  04/05/2013 FINDINGS: Mild global atrophy. No mass effect, midline shift, or acute hemorrhage. Cranium is intact. Mastoid air cells and visualized paranasal sinuses are clear. The cranium is intact IMPRESSION: No acute intracranial pathology. Electronically Signed   By: Marybelle Killings M.D.   On: 04/14/2015 11:48   I have personally reviewed and evaluated these images and lab results as part of my medical decision-making.   EKG Interpretation None       10:53 AM Headache improving  12:02 PM Pt reports headache much improved, sleeping soundly.  BP  has been low while sleeping.  IVF running.    Discussed pt with Dr Lita Mains who also saw the patient.   MDM   Final diagnoses:  Acute nonintractable headache, unspecified headache type    Afebrile, nontoxic patient with headache x 3 days, intermittent.  No red flags.  Relieved with medications.  No red flags including head trauma, fevers, meningeal signs, focal neurologic deficits (beyond baseline). Doubt intracranial emergency, meningitis.   D/C home with close PCP follow up, few fioricet.  Discussed result, findings, treatment, and follow up  with patient.  Pt given return precautions.  Pt verbalizes understanding and agrees with plan.         Clayton Bibles, PA-C 04/14/15 1501  Julianne Rice, MD 04/16/15 [phone]

## 2015-04-14 NOTE — Telephone Encounter (Signed)
Step daughter dropped off sample and would like to be contacted once labs are completed. Also wants to give you update on catheter situation  cb number is [phone]

## 2015-04-14 NOTE — Telephone Encounter (Signed)
Saw ER note.Marland Kitchen Appears pt doing better after change to larger cath and after starting antibiotics.  Let her know we have  Sent urine for culture.

## 2015-04-14 NOTE — Telephone Encounter (Signed)
He definate needs to see urology, I cannot manage his catheter here.. We need to find a way to fix transportation issues.  I have sent his info to the [facility] [employer].   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 Still think maybe suprapubic cath may be the way to go. But URO would do this. I will make referral once we know that the transportation issue is resolved.  Please get from [place] .Marland Kitchen Any uro names he has seen in past.

## 2015-04-14 NOTE — Telephone Encounter (Addendum)
Spoke with [facility] who dropped off the urine sample.  Advised his urine did look positive for UTI and to make sure Travis Cantrell is taking the Keflex four times a day, that Dr. Ermalene Searing sent in on Friday.   She states Travis Cantrell is not doing better with the catheter issue.  She states when she went to change the catheter she had a hard time taking it out and when it did finally come out there was blood around it.  She is concerned because she did notice some dark grayish gritty stuff building up around his catheter and she thinks what ever this is may be clogging his catheter and causing the recurrent UTI's.  She states she looked it up on the Internet and they called it encrustation.

## 2015-04-14 NOTE — Telephone Encounter (Signed)
chelsea called back to let you know that she spoke with social services about transporation.  Mr diebold was in the system for transporation.  The appointments need to be between 10-2.  She made him an appointment Friday 3/24 if she has a problem with transportation she will call back and r/s appointment

## 2015-04-14 NOTE — Discharge Instructions (Signed)
Read the information below.  Use the prescribed medication as directed.  Please discuss all new medications with your pharmacist.  You may return to the Emergency Department at any time for worsening condition or any new symptoms that concern you.    You are having a headache. No specific cause was found today for your headache. It may have been a migraine or other cause of headache. Stress, anxiety, fatigue, and depression are common triggers for headaches. Your headache today does not appear to be life-threatening or require hospitalization, but often the exact cause of headaches is not determined in the emergency department. Therefore, follow-up with your doctor is very important to find out what may have caused your headache, and whether or not you need any further diagnostic testing or treatment. Sometimes headaches can appear benign (not harmful), but then more serious symptoms can develop which should prompt an immediate re-evaluation by your doctor or the emergency department. SEEK MEDICAL ATTENTION IF: You develop possible problems with medications prescribed.  The medications don't resolve your headache, if it recurs , or if you have multiple episodes of vomiting or can't take fluids. You have a change from the usual headache. RETURN IMMEDIATELY IF you develop a sudden, severe headache or confusion, become poorly responsive or faint, develop a fever above 100.57F or problem breathing, have a change in speech, vision, swallowing, or understanding, or develop new weakness, numbness, tingling, incoordination, or have a seizure.   General Headache Without Cause A headache is pain or discomfort felt around the head or neck area. The specific cause of a headache may not be found. There are many causes and types of headaches. A few common ones are:  Tension headaches.  Migraine headaches.  Cluster headaches.  Chronic daily headaches. HOME CARE INSTRUCTIONS  Watch your condition for any  changes. Take these steps to help with your condition: Managing Pain  Take over-the-counter and prescription medicines only as told by your health care provider.  Lie down in a dark, quiet room when you have a headache.  If directed, apply ice to the head and neck area:  Put ice in a plastic bag.  Place a towel between your skin and the bag.  Leave the ice on for 20 minutes, 2-3 times per day.  Use a heating pad or hot shower to apply heat to the head and neck area as told by your health care provider.  Keep lights dim if bright lights bother you or make your headaches worse. Eating and Drinking  Eat meals on a regular schedule.  Limit alcohol use.  Decrease the amount of caffeine you drink, or stop drinking caffeine. General Instructions  Keep all follow-up visits as told by your health care provider. This is important.  Keep a headache journal to help find out what may trigger your headaches. For example, write down:  What you eat and drink.  How much sleep you get.  Any change to your diet or medicines.  Try massage or other relaxation techniques.  Limit stress.  Sit up straight, and do not tense your muscles.  Do not use tobacco products, including cigarettes, chewing tobacco, or e-cigarettes. If you need help quitting, ask your health care provider.  Exercise regularly as told by your health care provider.  Sleep on a regular schedule. Get 7-9 hours of sleep, or the amount recommended by your health care provider. SEEK MEDICAL CARE IF:   Your symptoms are not helped by medicine.  You have a headache that  is different from the usual headache.  You have nausea or you vomit.  You have a fever. SEEK IMMEDIATE MEDICAL CARE IF:   Your headache becomes severe.  You have repeated vomiting.  You have a stiff neck.  You have a loss of vision.  You have problems with speech.  You have pain in the eye or ear.  You have muscular weakness or loss of  muscle control.  You lose your balance or have trouble walking.  You feel faint or pass out.  You have confusion.   This information is not intended to replace advice given to you by your health care provider. Make sure you discuss any questions you have with your health care provider.   Document Released: 01/10/2005 Document Revised: 10/01/2014 Document Reviewed: 05/05/2014 Elsevier Interactive Patient Education [employer]

## 2015-04-15 NOTE — Telephone Encounter (Signed)
Dr. Ermalene Searing, would you like to place a referral for [facility] in [place]?  We have at least one option for transportation if the appointment is scheduled between 10-2 pm and Maegen is checking with [employer] for 2 options.  [facility] can work on getting him a urology appointment before his followup on 04/17/15, please advise.

## 2015-04-15 NOTE — Telephone Encounter (Signed)
Called cell number in chart and his sister answered and states Travis Cantrell's phone is out of wack and she was not at home.  She state she thinks Travis Cantrell has seen a Travis Cantrell, who is no longer in the area.  She was not sure if he had ever seen Travis Cantrell who is in the same practice.  She also states he has seen a urologist in [place], but didn't have a name.   Per Robin's note, looks like Travis Cantrell called social services and Travis Cantrell was in their system for transportation.  Appointments just need to be scheduled between 10-2.  She has scheduled Travis Cantrell an appointment for Friday 04/17/2015 with Travis Cantrell.

## 2015-04-15 NOTE — Telephone Encounter (Signed)
Spoke with patient's sister, she says his phone is not working right now. I told her I will do research regarding his transportation issue and call her back. mn

## 2015-04-16 DIAGNOSIS — N390 Urinary tract infection, site not specified: Secondary | ICD-10-CM | POA: Insufficient documentation

## 2015-04-16 LAB — URINE CULTURE: Colony Count: >=100000

## 2015-04-16 NOTE — Addendum Note (Signed)
Addended byKerby Nora E on: 04/16/2015 01:26 PM   Modules accepted: Orders

## 2015-04-16 NOTE — Telephone Encounter (Signed)
Please ask family member (? Step daughter) to try to be with him at [facility] to help  Explain issues and concerns)

## 2015-04-17 ENCOUNTER — Ambulatory Visit: Payer: Self-pay | Admitting: Family Medicine

## 2015-04-18 DIAGNOSIS — J9690 Respiratory failure, unspecified, unspecified whether with hypoxia or hypercapnia: Secondary | ICD-10-CM | POA: Diagnosis not present

## 2015-04-18 DIAGNOSIS — L8992 Pressure ulcer of unspecified site, stage 2: Secondary | ICD-10-CM | POA: Diagnosis not present

## 2015-04-21 DIAGNOSIS — R32 Unspecified urinary incontinence: Secondary | ICD-10-CM | POA: Diagnosis not present

## 2015-04-21 DIAGNOSIS — L89152 Pressure ulcer of sacral region, stage 2: Secondary | ICD-10-CM | POA: Diagnosis not present

## 2015-04-24 ENCOUNTER — Encounter: Payer: Self-pay | Admitting: Family Medicine

## 2015-04-24 ENCOUNTER — Ambulatory Visit (INDEPENDENT_AMBULATORY_CARE_PROVIDER_SITE_OTHER): Payer: Medicare Other | Admitting: Family Medicine

## 2015-04-24 ENCOUNTER — Encounter: Payer: Self-pay | Admitting: *Deleted

## 2015-04-24 ENCOUNTER — Other Ambulatory Visit: Payer: Self-pay | Admitting: Family Medicine

## 2015-04-24 VITALS — BP 152/108 | HR 94 | Temp 97.9°F

## 2015-04-24 DIAGNOSIS — R7303 Prediabetes: Secondary | ICD-10-CM | POA: Diagnosis not present

## 2015-04-24 DIAGNOSIS — Z1211 Encounter for screening for malignant neoplasm of colon: Secondary | ICD-10-CM

## 2015-04-24 DIAGNOSIS — T83511D Infection and inflammatory reaction due to indwelling urethral catheter, subsequent encounter: Secondary | ICD-10-CM | POA: Diagnosis not present

## 2015-04-24 DIAGNOSIS — Z1159 Encounter for screening for other viral diseases: Secondary | ICD-10-CM | POA: Diagnosis not present

## 2015-04-24 DIAGNOSIS — G35 Multiple sclerosis: Secondary | ICD-10-CM

## 2015-04-24 DIAGNOSIS — Z125 Encounter for screening for malignant neoplasm of prostate: Secondary | ICD-10-CM

## 2015-04-24 DIAGNOSIS — I10 Essential (primary) hypertension: Secondary | ICD-10-CM

## 2015-04-24 DIAGNOSIS — Z7289 Other problems related to lifestyle: Secondary | ICD-10-CM | POA: Diagnosis not present

## 2015-04-24 DIAGNOSIS — N390 Urinary tract infection, site not specified: Secondary | ICD-10-CM

## 2015-04-24 DIAGNOSIS — E7800 Pure hypercholesterolemia, unspecified: Secondary | ICD-10-CM

## 2015-04-24 LAB — COMPREHENSIVE METABOLIC PANEL
ALBUMIN: 4.2 g/dL (ref 3.5–5.2)
ALK PHOS: 97 U/L (ref 39–117)
ALT: 15 U/L (ref 0–53)
AST: 16 U/L (ref 0–37)
BILIRUBIN TOTAL: 0.9 mg/dL (ref 0.2–1.2)
BUN: 14 mg/dL (ref 6–23)
CO2: 30 mEq/L (ref 19–32)
CREATININE: 0.9 mg/dL (ref 0.40–1.50)
Calcium: 9.8 mg/dL (ref 8.4–10.5)
Chloride: 100 mEq/L (ref 96–112)
GFR: 93.11 mL/min (ref >60.00)
GLUCOSE: 104 mg/dL — AB (ref 70–99)
Potassium: 4.5 mEq/L (ref 3.5–5.1)
SODIUM: 138 meq/L (ref 135–145)
TOTAL PROTEIN: 7.3 g/dL (ref 6.0–8.3)

## 2015-04-24 LAB — LDL CHOLESTEROL, DIRECT: Direct LDL: 106 mg/dL

## 2015-04-24 LAB — LIPID PANEL
CHOL/HDL RATIO: 5
Cholesterol: 198 mg/dL (ref 0–200)
HDL: 38.8 mg/dL — AB (ref >39.00)
NONHDL: 159.34
TRIGLYCERIDES: 306 mg/dL — AB (ref 0.0–149.0)
VLDL: 61.2 mg/dL — ABNORMAL HIGH (ref 0.0–40.0)

## 2015-04-24 LAB — PSA, MEDICARE: PSA: 1.21 ng/ml (ref 0.10–4.00)

## 2015-04-24 LAB — HEMOGLOBIN A1C: HEMOGLOBIN A1C: 5.8 % (ref 4.6–6.5)

## 2015-04-24 MED ORDER — CEPHALEXIN 500 MG PO CAPS: 500.0000 mg | ORAL_CAPSULE | Freq: Four times a day (QID) | ORAL | Status: DC

## 2015-04-24 NOTE — Assessment & Plan Note (Addendum)
Symtpoms resolved now. Pt needs better management to prevent frewquent ER visits and infecitons of urine. Permy research, There is no clear benefit to using either antibiotic-coated urinary catheters or prophylactic antibiotics to reduce the risk of catheter associated urinary tract infection.  Pt has appt with urologist next week about suprapubic cath plcement and any other ways to decrease issue.  I will given pt a prescription for an antibiotic to have on hand to use if symptoms start, but he has been instructed to call if not improving in 48 hours , sooner if fever.

## 2015-04-24 NOTE — Progress Notes (Signed)
Pre visit review using our clinic review tool, if applicable. No additional management support is needed unless otherwise documented below in the visit note. 

## 2015-04-24 NOTE — Assessment & Plan Note (Signed)
Elevated today, but pt did nt yet take meds. Pt to take and follow BP at home.

## 2015-04-24 NOTE — Assessment & Plan Note (Signed)
Due for re-eval. 

## 2015-04-24 NOTE — Progress Notes (Signed)
Subjective:    Patient ID: Travis Cantrell, male    DOB: 09-16-1960, 55 y.o.   MRN: [id number]  HPI 55 year old with severe progressive multiple sclerosis presents for follow up.  He has had multiple  ER visits and admissions in last 6 months for catheter related UTI and urosepsis. He has no transportation, so previously if he had symptoms.. He went to ER.  Recently 04/14/2015 he was given keflex for proteus. He was having leakage from his catheter, but this is improved after cather change per pt. He states he has no urinary symptoms now. No bad odor currently.   His blood pressure is elevated today, HTN treatment with lisinopril. He has not been checking at home, but did not take BP med today. BP Readings from Last 3 Encounters:  04/24/15 152/108  04/14/15 120/80  03/19/15 130/80  Using medication without problems or lightheadedness: None Chest pain with exertion:None Edema:miminal Short of breath:None Average home BPs: not checking Other issues:  Elevated Cholesterol:   He is overdue for re-eval. On atorvastatin Lab Results  Component Value Date   CHOL 237* 11/19/2013   HDL 17.90* 11/19/2013   LDLCALC 185* 11/19/2013   LDLDIRECT 176.6 06/22/2011   TRIG NOTIFIED PAM CRAWFORD AT 1130 ON 02/11/15 BY V[facility] 02/12/2015   CHOLHDL 13 11/19/2013  Using medications without problems: Muscle aches:  Diet compliance: moderate. Exercise:unable Other complaints:   He is overdue for CPX. He is due for hep C screen and prostate cancer screening. He is due for Tdap. He refuses colonoscopy eval for colon cancer.     Review of Systems  Constitutional: Negative for fever and fatigue.  HENT: Negative for ear pain.   Eyes: Negative for pain.  Respiratory: Negative for cough and shortness of breath.   Cardiovascular: Negative for chest pain, palpitations and leg swelling.  Gastrointestinal: Negative for abdominal pain.  Genitourinary: Negative for dysuria.  Musculoskeletal: Negative  for arthralgias.  Neurological: Negative for syncope, light-headedness and headaches.  Psychiatric/Behavioral: Negative for dysphoric mood.       Objective:   Physical Exam  Constitutional: He is oriented to person, place, and time.  Wheel chair bound, slumped over forward and to left in chair. Difficulty keeping head up Unkempt  HENT:  Head: Normocephalic and atraumatic.  Right Ear: External ear normal. No middle ear effusion.  Left Ear: External ear normal.  No middle ear effusion.  Nose: Nose normal. No mucosal edema or sinus tenderness. Right sinus exhibits no maxillary sinus tenderness. Left sinus exhibits no maxillary sinus tenderness.  Mouth/Throat: Oropharynx is clear and moist. No posterior oropharyngeal edema or posterior oropharyngeal erythema.  Eyes: Conjunctivae and EOM are normal. Pupils are equal, round, and reactive to light.  Neck: Normal range of motion. Neck supple. No thyromegaly present.  Cardiovascular: Normal rate, regular rhythm, normal heart sounds and intact distal pulses.  Exam reveals no gallop and no friction rub.   No murmur heard. Pulmonary/Chest: Effort normal and breath sounds normal. No respiratory distress. He has no wheezes. He has no rales. He exhibits no tenderness.  Abdominal: Soft.  Neurological: He is alert and oriented to person, place, and time. He displays atrophy. He exhibits abnormal muscle tone.  He has 0/5 strenght in B Lext and 2-3/5 strength in upper extremities.. Most strength in left arm. Contractures in right arm that cause pain.  Skin: Skin is warm.  B toenails thickened deformed and discolored.          Assessment &  Plan:

## 2015-04-24 NOTE — Assessment & Plan Note (Signed)
Stable control but severe. Pt on no med and not interested in neurology re-consult.

## 2015-04-24 NOTE — Addendum Note (Signed)
Addended by: Alvina Chou on: 04/24/2015 12:11 PM   Modules accepted: Orders

## 2015-04-24 NOTE — Patient Instructions (Addendum)
Keep urology appointment. If usual urinary tract infection symptoms start.. Start keflex... If not improving in 2 days, call for appointment to be seen or to have urine brought in. If symptoms resolved complete the course of antbiotics. Stop at lab on way out. Pick up stool cards at lab.

## 2015-04-25 LAB — HEPATITIS C ANTIBODY: HCV AB: NEGATIVE

## 2015-04-27 ENCOUNTER — Encounter: Payer: Self-pay | Admitting: *Deleted

## 2015-05-08 DIAGNOSIS — N319 Neuromuscular dysfunction of bladder, unspecified: Secondary | ICD-10-CM | POA: Diagnosis not present

## 2015-05-08 DIAGNOSIS — Q649 Congenital malformation of urinary system, unspecified: Secondary | ICD-10-CM | POA: Diagnosis not present

## 2015-05-09 ENCOUNTER — Other Ambulatory Visit: Payer: Self-pay | Admitting: Family Medicine

## 2015-05-10 NOTE — Telephone Encounter (Signed)
Last office visit 04/24/2015.  Last refilled 02/2015.  Ok to refill?

## 2015-05-11 ENCOUNTER — Other Ambulatory Visit: Payer: Self-pay | Admitting: Family Medicine

## 2015-05-11 NOTE — Telephone Encounter (Signed)
Diazepam called into [facility] [address]

## 2015-05-19 DIAGNOSIS — L8992 Pressure ulcer of unspecified site, stage 2: Secondary | ICD-10-CM | POA: Diagnosis not present

## 2015-05-19 DIAGNOSIS — J9690 Respiratory failure, unspecified, unspecified whether with hypoxia or hypercapnia: Secondary | ICD-10-CM | POA: Diagnosis not present

## 2015-06-03 ENCOUNTER — Other Ambulatory Visit: Payer: Self-pay | Admitting: Family Medicine

## 2015-06-03 NOTE — Telephone Encounter (Signed)
Last office visit 04/24/2015.  Ok to refill?

## 2015-06-04 NOTE — Telephone Encounter (Signed)
Spoke with Travis Cantrell. He couldn't really give me a clear answer about taking the antibiotics.  He states his step daughter fills up his pill box and he thinks the refill got called in because the bottle was empty.  I have left a message for [name] to return my call.

## 2015-06-04 NOTE — Telephone Encounter (Signed)
Please call to make sure pt using antibiotics correctly. He is supposed to use them if symptoms of infection start for a typical 7 day course, not continuously. Has he really used all of them already?

## 2015-06-05 ENCOUNTER — Other Ambulatory Visit: Payer: Self-pay | Admitting: Family Medicine

## 2015-06-05 NOTE — Telephone Encounter (Signed)
Spoke with Greer.  She states Mr. Mangione has taken all the Keflex that was prescribed.  He has not been to see the Urologist due to appointment times and transportation conflicts.  Chelsea also would like Dr. Ermalene Searing to write a prescription for Chux pads, adult diapers and baby wipes so these items can be covered by Mr. Banfield's insurance.  Please advise.

## 2015-06-05 NOTE — Telephone Encounter (Addendum)
Diazepam called into [facility] [address] Chelsea notified refills have been sent to pharmacy  Prescription for supplies is ready to be picked up at the front desk.

## 2015-06-05 NOTE — Telephone Encounter (Signed)
Rx for other items in out box

## 2015-06-05 NOTE — Telephone Encounter (Signed)
Chelsea returned Donna's call.  Call her back at [phone].

## 2015-06-09 ENCOUNTER — Other Ambulatory Visit: Payer: Self-pay

## 2015-06-09 MED ORDER — GABAPENTIN 300 MG PO CAPS: 900.0000 mg | ORAL_CAPSULE | Freq: Three times a day (TID) | ORAL | Status: DC

## 2015-06-09 NOTE — Telephone Encounter (Signed)
Mucinex DM and nasal saline irrigation or spray 2-3 times daily. If allergy symptoms can use zyrtec at bedtime

## 2015-06-09 NOTE — Telephone Encounter (Signed)
Please advise about second part of phone message-re:sinus medication recommendations.

## 2015-06-09 NOTE — Telephone Encounter (Signed)
Chelsea pts stepdaughter left v/m (do not see DPR) requesting refill gabapentin to [facility] [address].last filled # [phone] on 05/11/15; last seen 04/24/15.pt will be out of gabapentin today. Pt also went to dentist about facial pain and pt was advised sinus issue. Wants to know what pt should take for sinus problem and facial pain. [facility] [address]

## 2015-06-10 NOTE — Telephone Encounter (Signed)
Left message for [name] that Travis Cantrell can use Mucinex DM, Nasal Saline Irrigation/Spray 2 to 3 times a day.  If allergy symptoms, he can take zyrtec at bedtime.

## 2015-06-18 DIAGNOSIS — J9690 Respiratory failure, unspecified, unspecified whether with hypoxia or hypercapnia: Secondary | ICD-10-CM | POA: Diagnosis not present

## 2015-06-18 DIAGNOSIS — L8992 Pressure ulcer of unspecified site, stage 2: Secondary | ICD-10-CM | POA: Diagnosis not present

## 2015-06-28 ENCOUNTER — Other Ambulatory Visit: Payer: Self-pay | Admitting: Family Medicine

## 2015-06-28 NOTE — Telephone Encounter (Signed)
Last office visit 04/24/2015.  Ok to refill?

## 2015-07-02 DIAGNOSIS — N3020 Other chronic cystitis without hematuria: Secondary | ICD-10-CM | POA: Diagnosis not present

## 2015-07-05 ENCOUNTER — Emergency Department: Payer: Medicare Other

## 2015-07-05 ENCOUNTER — Encounter: Payer: Self-pay | Admitting: Emergency Medicine

## 2015-07-05 ENCOUNTER — Inpatient Hospital Stay
Admission: EM | Admit: 2015-07-05 | Discharge: 2015-07-09 | DRG: 698 | Disposition: A | Discharge status: Home Health | Payer: Medicare Other | Attending: Internal Medicine | Admitting: Internal Medicine

## 2015-07-05 DIAGNOSIS — G35 Multiple sclerosis: Secondary | ICD-10-CM | POA: Diagnosis present

## 2015-07-05 DIAGNOSIS — F419 Anxiety disorder, unspecified: Secondary | ICD-10-CM | POA: Diagnosis present

## 2015-07-05 DIAGNOSIS — Y846 Urinary catheterization as the cause of abnormal reaction of the patient, or of later complication, without mention of misadventure at the time of the procedure: Secondary | ICD-10-CM | POA: Diagnosis present

## 2015-07-05 DIAGNOSIS — A419 Sepsis, unspecified organism: Secondary | ICD-10-CM | POA: Diagnosis not present

## 2015-07-05 DIAGNOSIS — N3001 Acute cystitis with hematuria: Secondary | ICD-10-CM | POA: Diagnosis present

## 2015-07-05 DIAGNOSIS — T83098A Other mechanical complication of other indwelling urethral catheter, initial encounter: Secondary | ICD-10-CM | POA: Diagnosis not present

## 2015-07-05 DIAGNOSIS — T83511A Infection and inflammatory reaction due to indwelling urethral catheter, initial encounter: Secondary | ICD-10-CM | POA: Diagnosis not present

## 2015-07-05 DIAGNOSIS — E860 Dehydration: Secondary | ICD-10-CM | POA: Diagnosis not present

## 2015-07-05 DIAGNOSIS — B370 Candidal stomatitis: Secondary | ICD-10-CM | POA: Diagnosis not present

## 2015-07-05 DIAGNOSIS — I10 Essential (primary) hypertension: Secondary | ICD-10-CM | POA: Diagnosis present

## 2015-07-05 DIAGNOSIS — R509 Fever, unspecified: Secondary | ICD-10-CM | POA: Diagnosis not present

## 2015-07-05 DIAGNOSIS — Z79899 Other long term (current) drug therapy: Secondary | ICD-10-CM | POA: Diagnosis not present

## 2015-07-05 DIAGNOSIS — K219 Gastro-esophageal reflux disease without esophagitis: Secondary | ICD-10-CM | POA: Diagnosis present

## 2015-07-05 DIAGNOSIS — T839XXA Unspecified complication of genitourinary prosthetic device, implant and graft, initial encounter: Secondary | ICD-10-CM

## 2015-07-05 DIAGNOSIS — N390 Urinary tract infection, site not specified: Secondary | ICD-10-CM | POA: Diagnosis not present

## 2015-07-05 DIAGNOSIS — Z7401 Bed confinement status: Secondary | ICD-10-CM | POA: Diagnosis not present

## 2015-07-05 DIAGNOSIS — E785 Hyperlipidemia, unspecified: Secondary | ICD-10-CM | POA: Diagnosis present

## 2015-07-05 DIAGNOSIS — A499 Bacterial infection, unspecified: Secondary | ICD-10-CM | POA: Diagnosis not present

## 2015-07-05 DIAGNOSIS — N179 Acute kidney failure, unspecified: Secondary | ICD-10-CM | POA: Diagnosis not present

## 2015-07-05 DIAGNOSIS — E876 Hypokalemia: Secondary | ICD-10-CM | POA: Diagnosis not present

## 2015-07-05 DIAGNOSIS — R319 Hematuria, unspecified: Secondary | ICD-10-CM | POA: Diagnosis not present

## 2015-07-05 LAB — COMPREHENSIVE METABOLIC PANEL
ALT: 11 U/L — AB (ref 17–63)
AST: 14 U/L — AB (ref 15–41)
Albumin: 3.6 g/dL (ref 3.5–5.0)
Alkaline Phosphatase: 88 U/L (ref 38–126)
Anion gap: 12 (ref 5–15)
BILIRUBIN TOTAL: 1.8 mg/dL — AB (ref 0.3–1.2)
BUN: 30 mg/dL — AB (ref 6–20)
CHLORIDE: 103 mmol/L (ref 101–111)
CO2: 23 mmol/L (ref 22–32)
CREATININE: 1.41 mg/dL — AB (ref 0.61–1.24)
Calcium: 9.3 mg/dL (ref 8.9–10.3)
GFR calc Af Amer: >60 mL/min (ref >60)
GFR, EST NON AFRICAN AMERICAN: 55 mL/min — AB (ref >60)
Glucose, Bld: 148 mg/dL — ABNORMAL HIGH (ref 65–99)
Potassium: 3.7 mmol/L (ref 3.5–5.1)
Sodium: 138 mmol/L (ref 135–145)
TOTAL PROTEIN: 7.8 g/dL (ref 6.5–8.1)

## 2015-07-05 LAB — CBC WITH DIFFERENTIAL/PLATELET
Basophils Absolute: 0.3 10*3/uL — ABNORMAL HIGH (ref 0–0.1)
Basophils Relative: 1 %
EOS PCT: 0 %
Eosinophils Absolute: 0 10*3/uL (ref 0–0.7)
HCT: 41.8 % (ref 40.0–52.0)
Hemoglobin: 13.2 g/dL (ref 13.0–18.0)
LYMPHS ABS: 1.7 10*3/uL (ref 1.0–3.6)
Lymphocytes Relative: 6 %
MCH: 24 pg — AB (ref 26.0–34.0)
MCHC: 31.6 g/dL — AB (ref 32.0–36.0)
MCV: 75.9 fL — AB (ref 80.0–100.0)
MONO ABS: 2.9 10*3/uL — AB (ref 0.2–1.0)
Monocytes Relative: 10 %
Neutro Abs: 23.8 10*3/uL — ABNORMAL HIGH (ref 1.4–6.5)
Neutrophils Relative %: 83 %
PLATELETS: 397 10*3/uL (ref 150–440)
RBC: 5.51 MIL/uL (ref 4.40–5.90)
RDW: 15.2 % — AB (ref 11.5–14.5)
WBC: 28.7 10*3/uL — AB (ref 3.8–10.6)

## 2015-07-05 LAB — URINALYSIS COMPLETE WITH MICROSCOPIC (ARMC ONLY)
Bilirubin Urine: NEGATIVE
Glucose, UA: NEGATIVE mg/dL
NITRITE: NEGATIVE
PROTEIN: 100 mg/dL — AB
SQUAMOUS EPITHELIAL / LPF: NONE SEEN
Specific Gravity, Urine: 1.016 (ref 1.005–1.030)
pH: 5 (ref 5.0–8.0)

## 2015-07-05 LAB — LACTIC ACID, PLASMA: Lactic Acid, Venous: 1.6 mmol/L (ref 0.5–2.0)

## 2015-07-05 MED ORDER — SODIUM CHLORIDE 0.9 % IV BOLUS (SEPSIS)
1000.0000 mL | Freq: Once | INTRAVENOUS | Status: AC
  Administered 2015-07-05: 1000 mL via INTRAVENOUS

## 2015-07-05 MED ORDER — CEFEPIME HCL 2 G IJ SOLR
2.0000 g | Freq: Once | INTRAMUSCULAR | Status: AC
  Administered 2015-07-05: 2 g via INTRAVENOUS
  Filled 2015-07-05: qty 2

## 2015-07-05 MED ORDER — SODIUM CHLORIDE 0.9 % IV BOLUS (SEPSIS)
1000.0000 mL | Freq: Once | INTRAVENOUS | Status: AC
  Administered 2015-07-06: 1000 mL via INTRAVENOUS

## 2015-07-05 NOTE — Progress Notes (Signed)
Pharmacy Antibiotic Note  JAQUESE CARLOCK is a 55 y.o. male admitted on 07/05/2015 with UTI.  Pharmacy has been consulted for cefepime dosing.  Plan: Cefepime 1 gm IV Q12H  Weight: 183 lb (83.008 kg)  Temp (24hrs), Avg:100.1 F (37.8 C), Min:100.1 F (37.8 C), Max:100.1 F (37.8 C)   Recent Labs Lab 07/05/15 2222 07/05/15 2223  WBC 28.7*  --   CREATININE 1.41*  --   LATICACIDVEN  --  1.6    Estimated Creatinine Clearance: 61.1 mL/min (by C-G formula based on Cr of 1.41).    Allergies  Allergen Reactions  . Baclofen Diarrhea    Thank you for allowing pharmacy to be a part of this patient's care.  Carola Frost, Pharm.D., BCPS Clinical Pharmacist 07/05/2015 11:17 PM
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
## 2015-07-05 NOTE — ED Notes (Signed)
Travis Cantrell , daughter [phone]
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
## 2015-07-06 DIAGNOSIS — Z7401 Bed confinement status: Secondary | ICD-10-CM | POA: Diagnosis not present

## 2015-07-06 DIAGNOSIS — I10 Essential (primary) hypertension: Secondary | ICD-10-CM | POA: Diagnosis present

## 2015-07-06 DIAGNOSIS — E785 Hyperlipidemia, unspecified: Secondary | ICD-10-CM | POA: Diagnosis present

## 2015-07-06 DIAGNOSIS — T83511A Infection and inflammatory reaction due to indwelling urethral catheter, initial encounter: Secondary | ICD-10-CM | POA: Diagnosis present

## 2015-07-06 DIAGNOSIS — Z79899 Other long term (current) drug therapy: Secondary | ICD-10-CM | POA: Diagnosis not present

## 2015-07-06 DIAGNOSIS — K219 Gastro-esophageal reflux disease without esophagitis: Secondary | ICD-10-CM | POA: Diagnosis present

## 2015-07-06 DIAGNOSIS — F419 Anxiety disorder, unspecified: Secondary | ICD-10-CM | POA: Diagnosis present

## 2015-07-06 DIAGNOSIS — N179 Acute kidney failure, unspecified: Secondary | ICD-10-CM | POA: Diagnosis present

## 2015-07-06 DIAGNOSIS — G35 Multiple sclerosis: Secondary | ICD-10-CM | POA: Diagnosis present

## 2015-07-06 DIAGNOSIS — Y846 Urinary catheterization as the cause of abnormal reaction of the patient, or of later complication, without mention of misadventure at the time of the procedure: Secondary | ICD-10-CM | POA: Diagnosis present

## 2015-07-06 DIAGNOSIS — N390 Urinary tract infection, site not specified: Secondary | ICD-10-CM | POA: Diagnosis present

## 2015-07-06 DIAGNOSIS — N3001 Acute cystitis with hematuria: Secondary | ICD-10-CM | POA: Diagnosis present

## 2015-07-06 DIAGNOSIS — A419 Sepsis, unspecified organism: Secondary | ICD-10-CM | POA: Diagnosis present

## 2015-07-06 DIAGNOSIS — E876 Hypokalemia: Secondary | ICD-10-CM | POA: Diagnosis not present

## 2015-07-06 DIAGNOSIS — E860 Dehydration: Secondary | ICD-10-CM | POA: Diagnosis present

## 2015-07-06 DIAGNOSIS — B370 Candidal stomatitis: Secondary | ICD-10-CM | POA: Diagnosis not present

## 2015-07-06 LAB — CBC
HEMATOCRIT: 39.7 % — AB (ref 40.0–52.0)
HEMOGLOBIN: 12.5 g/dL — AB (ref 13.0–18.0)
MCH: 24.5 pg — ABNORMAL LOW (ref 26.0–34.0)
MCHC: 31.6 g/dL — AB (ref 32.0–36.0)
MCV: 77.6 fL — ABNORMAL LOW (ref 80.0–100.0)
Platelets: 319 10*3/uL (ref 150–440)
RBC: 5.11 MIL/uL (ref 4.40–5.90)
RDW: 15.6 % — AB (ref 11.5–14.5)
WBC: 22.8 10*3/uL — AB (ref 3.8–10.6)

## 2015-07-06 LAB — APTT: APTT: 42 s — AB (ref 24–36)

## 2015-07-06 LAB — COMPREHENSIVE METABOLIC PANEL
ALBUMIN: 3.2 g/dL — AB (ref 3.5–5.0)
ALK PHOS: 79 U/L (ref 38–126)
ALT: 9 U/L — ABNORMAL LOW (ref 17–63)
ANION GAP: 11 (ref 5–15)
AST: 13 U/L — AB (ref 15–41)
BILIRUBIN TOTAL: 1.8 mg/dL — AB (ref 0.3–1.2)
BUN: 26 mg/dL — AB (ref 6–20)
CO2: 21 mmol/L — AB (ref 22–32)
Calcium: 8.4 mg/dL — ABNORMAL LOW (ref 8.9–10.3)
Chloride: 108 mmol/L (ref 101–111)
Creatinine, Ser: 1.26 mg/dL — ABNORMAL HIGH (ref 0.61–1.24)
GFR calc Af Amer: >60 mL/min (ref >60)
GFR calc non Af Amer: >60 mL/min (ref >60)
GLUCOSE: 127 mg/dL — AB (ref 65–99)
POTASSIUM: 3.7 mmol/L (ref 3.5–5.1)
SODIUM: 140 mmol/L (ref 135–145)
Total Protein: 7 g/dL (ref 6.5–8.1)

## 2015-07-06 LAB — PROTIME-INR
INR: 1.4
Prothrombin Time: 17.3 seconds — ABNORMAL HIGH (ref 11.4–15.0)

## 2015-07-06 LAB — MRSA PCR SCREENING: MRSA BY PCR: NEGATIVE

## 2015-07-06 LAB — PROCALCITONIN: Procalcitonin: 0.17 ng/mL

## 2015-07-06 LAB — LACTIC ACID, PLASMA: Lactic Acid, Venous: 1.8 mmol/L (ref 0.5–2.0)

## 2015-07-06 MED ORDER — METOPROLOL SUCCINATE ER 25 MG PO TB24
25.0000 mg | ORAL_TABLET | Freq: Every day | ORAL | Status: DC
  Administered 2015-07-06 – 2015-07-09 (×5): 25 mg via ORAL
  Filled 2015-07-06 (×5): qty 1

## 2015-07-06 MED ORDER — GABAPENTIN 300 MG PO CAPS
900.0000 mg | ORAL_CAPSULE | Freq: Three times a day (TID) | ORAL | Status: DC
  Administered 2015-07-06 – 2015-07-09 (×10): 900 mg via ORAL
  Filled 2015-07-06 (×10): qty 3

## 2015-07-06 MED ORDER — VANCOMYCIN HCL IN DEXTROSE 1-5 GM/200ML-% IV SOLN: 1000.0000 mg | Freq: Once | INTRAVENOUS | Status: DC

## 2015-07-06 MED ORDER — ACETAMINOPHEN 325 MG PO TABS: 650.0000 mg | ORAL_TABLET | Freq: Four times a day (QID) | ORAL | Status: DC | PRN

## 2015-07-06 MED ORDER — ALBUTEROL SULFATE (2.5 MG/3ML) 0.083% IN NEBU: 2.5000 mg | INHALATION_SOLUTION | RESPIRATORY_TRACT | Status: DC | PRN

## 2015-07-06 MED ORDER — DEXTROSE 5 % IV SOLN: 1.0000 g | Freq: Two times a day (BID) | INTRAVENOUS | Status: DC

## 2015-07-06 MED ORDER — LISINOPRIL 10 MG PO TABS
10.0000 mg | ORAL_TABLET | Freq: Every day | ORAL | Status: DC
  Administered 2015-07-06 – 2015-07-09 (×4): 10 mg via ORAL
  Filled 2015-07-06 (×4): qty 1

## 2015-07-06 MED ORDER — FLUOXETINE HCL 20 MG PO CAPS
40.0000 mg | ORAL_CAPSULE | Freq: Every day | ORAL | Status: DC
  Administered 2015-07-06: 40 mg via ORAL
  Filled 2015-07-06: qty 2

## 2015-07-06 MED ORDER — FLUOXETINE HCL 20 MG PO CAPS
20.0000 mg | ORAL_CAPSULE | Freq: Every day | ORAL | Status: DC
  Administered 2015-07-07 – 2015-07-09 (×3): 20 mg via ORAL
  Filled 2015-07-06 (×4): qty 1

## 2015-07-06 MED ORDER — LISINOPRIL 10 MG PO TABS
10.0000 mg | ORAL_TABLET | Freq: Once | ORAL | Status: AC
  Administered 2015-07-06: 03:00:00 10 mg via ORAL
  Filled 2015-07-06: qty 1

## 2015-07-06 MED ORDER — ATORVASTATIN CALCIUM 20 MG PO TABS
40.0000 mg | ORAL_TABLET | Freq: Every day | ORAL | Status: DC
  Administered 2015-07-06 – 2015-07-08 (×4): 40 mg via ORAL
  Filled 2015-07-06 (×4): qty 2

## 2015-07-06 MED ORDER — FLUOXETINE HCL 20 MG PO CAPS
20.0000 mg | ORAL_CAPSULE | Freq: Every day | ORAL | Status: DC
  Administered 2015-07-06: 03:00:00 20 mg via ORAL
  Filled 2015-07-06: qty 1

## 2015-07-06 MED ORDER — ENSURE ENLIVE PO LIQD
237.0000 mL | Freq: Two times a day (BID) | ORAL | Status: DC
  Administered 2015-07-06 – 2015-07-09 (×7): 237 mL via ORAL

## 2015-07-06 MED ORDER — VANCOMYCIN HCL IN DEXTROSE 750-5 MG/150ML-% IV SOLN
750.0000 mg | Freq: Two times a day (BID) | INTRAVENOUS | Status: DC
  Administered 2015-07-06: 09:00:00 750 mg via INTRAVENOUS
  Filled 2015-07-06 (×2): qty 150

## 2015-07-06 MED ORDER — SODIUM CHLORIDE 0.9 % IV SOLN
INTRAVENOUS | Status: DC
  Administered 2015-07-06 – 2015-07-08 (×4): via INTRAVENOUS

## 2015-07-06 MED ORDER — ONDANSETRON HCL 4 MG/2ML IJ SOLN: 4.0000 mg | Freq: Four times a day (QID) | INTRAMUSCULAR | Status: DC | PRN

## 2015-07-06 MED ORDER — NYSTATIN 100000 UNIT/GM EX POWD
1.0000 g | Freq: Four times a day (QID) | CUTANEOUS | Status: DC
  Administered 2015-07-06 – 2015-07-09 (×13): 1 g via TOPICAL
  Filled 2015-07-06: qty 15

## 2015-07-06 MED ORDER — ACETAMINOPHEN 650 MG RE SUPP: 650.0000 mg | Freq: Four times a day (QID) | RECTAL | Status: DC | PRN

## 2015-07-06 MED ORDER — ENOXAPARIN SODIUM 40 MG/0.4ML ~~LOC~~ SOLN
40.0000 mg | SUBCUTANEOUS | Status: DC
  Administered 2015-07-06 – 2015-07-09 (×4): 40 mg via SUBCUTANEOUS
  Filled 2015-07-06 (×4): qty 0.4

## 2015-07-06 MED ORDER — ONDANSETRON HCL 4 MG PO TABS: 4.0000 mg | ORAL_TABLET | Freq: Four times a day (QID) | ORAL | Status: DC | PRN

## 2015-07-06 MED ORDER — FLUOXETINE HCL 20 MG PO CAPS
20.0000 mg | ORAL_CAPSULE | Freq: Every day | ORAL | Status: DC
  Administered 2015-07-06 – 2015-07-08 (×3): 20 mg via ORAL
  Filled 2015-07-06 (×2): qty 1

## 2015-07-06 MED ORDER — PANTOPRAZOLE SODIUM 40 MG PO TBEC
40.0000 mg | DELAYED_RELEASE_TABLET | Freq: Every day | ORAL | Status: DC
  Administered 2015-07-06 – 2015-07-09 (×4): 40 mg via ORAL
  Filled 2015-07-06 (×4): qty 1

## 2015-07-06 MED ORDER — DIAZEPAM 5 MG PO TABS
5.0000 mg | ORAL_TABLET | Freq: Two times a day (BID) | ORAL | Status: DC
  Administered 2015-07-06 – 2015-07-09 (×8): 5 mg via ORAL
  Filled 2015-07-06 (×8): qty 1

## 2015-07-06 MED ORDER — VANCOMYCIN HCL IN DEXTROSE 1-5 GM/200ML-% IV SOLN
1000.0000 mg | Freq: Once | INTRAVENOUS | Status: AC
  Administered 2015-07-06: 03:00:00 1000 mg via INTRAVENOUS
  Filled 2015-07-06: qty 200

## 2015-07-06 MED ORDER — MORPHINE SULFATE (PF) 2 MG/ML IV SOLN: 2.0000 mg | INTRAVENOUS | Status: DC | PRN

## 2015-07-06 MED ORDER — BUTALBITAL-APAP-CAFFEINE 50-325-40 MG PO TABS: 1.0000 | ORAL_TABLET | Freq: Four times a day (QID) | ORAL | Status: DC | PRN

## 2015-07-06 MED ORDER — METOPROLOL SUCCINATE ER 25 MG PO TB24
25.0000 mg | ORAL_TABLET | Freq: Once | ORAL | Status: AC
  Administered 2015-07-06: 03:00:00 25 mg via ORAL
  Filled 2015-07-06: qty 1

## 2015-07-06 MED ORDER — FLUOXETINE HCL 20 MG PO CAPS: 20.0000 mg | ORAL_CAPSULE | Freq: Two times a day (BID) | ORAL | Status: DC

## 2015-07-06 MED ORDER — PIPERACILLIN-TAZOBACTAM 3.375 G IVPB
3.3750 g | Freq: Three times a day (TID) | INTRAVENOUS | Status: DC
  Administered 2015-07-06 – 2015-07-07 (×5): 3.375 g via INTRAVENOUS
  Filled 2015-07-06 (×7): qty 50

## 2015-07-06 NOTE — Progress Notes (Signed)
Patient ID: Travis Cantrell, male   DOB: [birth year], 55 y.o.   MRN: [id number] [facility] PROGRESS NOTE  Travis Cantrell QIH:[id number] DOB: 11-25-1960 DOA: 07/05/2015 PCP: Kerby Nora, MD  HPI/Subjective: Patient feels okay. Urinary catheter changed in the emergency room.  Objective: Filed Vitals:   07/06/15 1100 07/06/15 1302  BP: 131/78 108/72  Pulse:  82  Temp:  98.7 F (37.1 C)  Resp:  18    Filed Weights   07/05/15 2220  Weight: 83.008 kg (183 lb)    ROS: Review of Systems  Constitutional: Negative for fever and chills.  Eyes: Negative for blurred vision.  Respiratory: Negative for cough and shortness of breath.   Cardiovascular: Negative for chest pain.  Gastrointestinal: Negative for nausea, vomiting, abdominal pain, diarrhea and constipation.  Genitourinary: Negative for dysuria.  Musculoskeletal: Negative for joint pain.  Neurological: Negative for dizziness and headaches.   Exam: Physical Exam    Data Reviewed: Basic Metabolic Panel:  Recent Labs Lab 07/05/15 2222 07/06/15 0205  NA 138 140  K 3.7 3.7  CL 103 108  CO2 23 21*  GLUCOSE 148* 127*  BUN 30* 26*  CREATININE 1.41* 1.26*  CALCIUM 9.3 8.4*   Liver Function Tests:  Recent Labs Lab 07/05/15 2222 07/06/15 0205  AST 14* 13*  ALT 11* 9*  ALKPHOS 88 79  BILITOT 1.8* 1.8*  PROT 7.8 7.0  ALBUMIN 3.6 3.2*   CBC:  Recent Labs Lab 07/05/15 2222 07/06/15 0205  WBC 28.7* 22.8*  NEUTROABS 23.8*  --   HGB 13.2 12.5*  HCT 41.8 39.7*  MCV 75.9* 77.6*  PLT 397 319     Recent Results (from the past 240 hour(s))  Blood Culture (routine x 2)     Status: None (Preliminary result)   Collection Time: 07/05/15 10:22 PM  Result Value Ref Range Status   Specimen Description BLOOD RIGHT HAND  Final   Special Requests BOTTLES DRAWN AEROBIC AND ANAEROBIC 7CCAERO,5CCANA  Final   Culture NO GROWTH < 12 HOURS  Final   Report Status PENDING  Incomplete  Blood Culture (routine x 2)     Status:  None (Preliminary result)   Collection Time: 07/05/15 10:36 PM  Result Value Ref Range Status   Specimen Description BLOOD LEFT ASSIST CONTROL  Final   Special Requests   Final    BOTTLES DRAWN AEROBIC AND ANAEROBIC 14CCAERO,16CCANA   Culture NO GROWTH < 12 HOURS  Final   Report Status PENDING  Incomplete  MRSA PCR Screening     Status: None   Collection Time: 07/06/15  2:45 AM  Result Value Ref Range Status   MRSA by PCR NEGATIVE NEGATIVE Final    Comment:        The GeneXpert MRSA Assay (FDA approved for NASAL specimens only), is one component of a comprehensive MRSA colonization surveillance program. It is not intended to diagnose MRSA infection nor to guide or monitor treatment for MRSA infections.      Studies: Dg Chest Port 1 View  07/05/2015  CLINICAL DATA:  Fever and weakness. EXAM: PORTABLE CHEST 1 VIEW COMPARISON:  02/14/2015 FINDINGS: Since the previous study, there is interval removal of all appliances. Normal heart size and pulmonary vascularity. No focal airspace disease or consolidation in the lungs. No blunting of costophrenic angles. No pneumothorax. Mediastinal contours appear intact. Ectatic aorta. IMPRESSION: No active disease. Electronically Signed   By: Burman Nieves M.D.   On: 07/05/2015 22:58    Scheduled Meds: .  atorvastatin  40 mg Oral QHS  . diazepam  5 mg Oral Q12H  . enoxaparin (LOVENOX) injection  40 mg Subcutaneous Q24H  . feeding supplement (ENSURE ENLIVE)  237 mL Oral BID WC  . [START ON 07/07/2015] FLUoxetine  20 mg Oral Daily   And  . FLUoxetine  20 mg Oral QHS  . gabapentin  900 mg Oral TID  . lisinopril  10 mg Oral Daily  . metoprolol succinate  25 mg Oral Daily  . nystatin  1 g Topical QID  . pantoprazole  40 mg Oral QAC breakfast  . piperacillin-tazobactam  3.375 g Intravenous Q8H   Continuous Infusions: . sodium chloride 75 mL/hr at 07/06/15 1200    Assessment/Plan:  1. Clinical sepsis, acute cystitis with hematuria with  chronic catheter, leukocytosis, fever and tachycardia. Continue IV Zosyn for now. Foley catheter changed in the ER. Await urine culture. 2. History of multiple sclerosis 3. Essential hypertension continue lisinopril metoprolol 4. Acute kidney injury continue IV fluid hydration and monitor kidney function 5. Hyperlipidemia unspecified on atorvastatin 6. Anxiety on fluoxetine and Valium 7. GERD on Protonix  Code Status:     Code Status Orders        Start     Ordered   07/06/15 0015  Full code   Continuous     07/06/15 0018    Code Status History    Date Active Date Inactive Code Status Order ID Comments User Context   03/16/2015  7:45 PM 03/19/2015  7:41 PM Full Code [id number]  Arnaldo Natal, MD Inpatient   02/15/2015  9:30 PM 02/18/2015  6:34 PM Full Code [id number]  Arrie Eastern, RN Inpatient   01/29/2015  9:46 AM 02/02/2015  6:20 PM Full Code [id number]  Ihor Austin, MD Inpatient   11/03/2014  8:04 AM 11/05/2014  2:36 PM Full Code [id number]  Arnaldo Natal, MD Inpatient   08/01/2014  3:18 PM 08/04/2014  6:26 PM Full Code [id number]  Katha Hamming, MD ED   06/15/2014  1:36 AM 06/18/2014  8:33 PM Full Code [id number]  Arnaldo Natal, MD ED     Disposition Plan: Need to have urine culture results and sensitivities back prior to discharge  Antibiotics:  Zosyn  Time spent: 35 minutes  Travis Cantrell  [employer]

## 2015-07-06 NOTE — Progress Notes (Signed)
Dr. Joneen Roach notified BP is elevated. Okay to give daily blood pressure medications now and at 1000 in the morning if blood pressure is stable. Okay to stop 3rd bolus and continue with NS at 75 ml/hr.

## 2015-07-06 NOTE — Progress Notes (Signed)
Pharmacy Antibiotic Note  Travis Cantrell is a 55 y.o. male admitted on 07/05/2015 with UTI.  Pharmacy has been consulted for Zosyn and vancomycin dosing.  Plan: 1. Zosyn 3.375 gm IV Q8H EI 2. Vancomycin 1 gm IV x 1 followed in 6 hours (stacked dosing) by vancomycin 750 mg IV Q12H, predicted trough 15 mcg/mL. Pharmacy will continue to follow and adjust as needed to maintain trough 15 to 20 mcg/mL.   Vd 53.9 L, Ke 0.055 hr-1, T1/2 12.6 hr  Weight: 183 lb (83.008 kg)  Temp (24hrs), Avg:100.1 F (37.8 C), Min:100.1 F (37.8 C), Max:100.1 F (37.8 C)   Recent Labs Lab 07/05/15 2222 07/05/15 2223  WBC 28.7*  --   CREATININE 1.41*  --   LATICACIDVEN  --  1.6    Estimated Creatinine Clearance: 61.1 mL/min (by C-G formula based on Cr of 1.41).    Allergies  Allergen Reactions  . Baclofen Diarrhea    Thank you for allowing pharmacy to be a part of this patient's care.  Carola Frost, Pharm.D., BCPS Clinical Pharmacist 07/06/2015 1:47 AM

## 2015-07-06 NOTE — Care Management Important Message (Signed)
Important Message  Patient Details  Name: Travis Cantrell MRN: [id number] Date of Birth: 15-May-1960   Medicare Important Message Given:  Yes    Gwenette Greet, RN 07/06/2015, 9:24 AM

## 2015-07-06 NOTE — ED Notes (Signed)
Selena Batten, daughter [phone]

## 2015-07-06 NOTE — H&P (Signed)
[facility] [place] NAME: Travis Cantrell    MR#:  [id number]  DATE OF BIRTH:  1960/11/18  DATE OF ADMISSION:  07/05/2015  PRIMARY CARE PHYSICIAN: Eliezer Lofts, MD   REQUESTING/REFERRING PHYSICIAN:   CHIEF COMPLAINT:   Chief Complaint  Patient presents with  . Fever  . Weakness    HISTORY OF PRESENT ILLNESS: Michale Emmerich  is a 55 y.o. male with a known history of GERD, multiple sclerosis, hypertension, decubitus ulcer presented to the emergency room with fever and chills for the last 2 days. Patient also has fatigue and generalized weakness. Patient is mostly bedbound and dependent on activities of daily living. He has multiple sclerosis which has caused a lot of physical disability. Patient has chronic indwelling Foley catheter. No history of any chest pain. No history of shortness of breath. No complaints of any headache dizziness or blurry vision. No history of fall. Workup in the emergency room showed patient to have urinary tract infection and sepsis. He was given IV fluids 3 L of normal saline in the emergency room. Broad-spectrum antibiotics were started. Hospitalist service was consulted for further care of the patient.  PAST MEDICAL HISTORY:   Past Medical History  Diagnosis Date  . Allergy   . GERD (gastroesophageal reflux disease)   . MS (multiple sclerosis) ([place])   . Hypertension   . Decubitus ulcer     PAST SURGICAL HISTORY: Past Surgical History  Procedure Laterality Date  . Vein reconsruction    . Rotator cuff repair      SOCIAL HISTORY:  Social History  Substance Use Topics  . Smoking status: Never Smoker   . Smokeless tobacco: Never Used  . Alcohol Use: No    FAMILY HISTORY:  Family History  Problem Relation Age of Onset  . Cancer Mother     multiple myeloma  . Diabetes Father   . Heart attack Father     DRUG ALLERGIES:  Allergies  Allergen Reactions  . Baclofen Diarrhea    REVIEW OF SYSTEMS:    CONSTITUTIONAL: Has fever, has weakness.  EYES: No blurred or double vision.  EARS, NOSE, AND THROAT: No tinnitus or ear pain.  RESPIRATORY: No cough, shortness of breath, wheezing or hemoptysis.  CARDIOVASCULAR: No chest pain, orthopnea, edema.  GASTROINTESTINAL: No nausea, vomiting, diarrhea or abdominal pain.  GENITOURINARY: has dysuria,no hematuria.  ENDOCRINE: No polyuria, nocturia,  HEMATOLOGY: No anemia, easy bruising or bleeding SKIN: No rash or lesion. MUSCULOSKELETAL: No joint pain or arthritis.  Has upper extremity contracture NEUROLOGIC: No tingling, numbness, weakness.  PSYCHIATRY: No anxiety or depression.   MEDICATIONS AT HOME:  Prior to Admission medications   Medication Sig Start Date End Date Taking? Authorizing Provider  albuterol (PROVENTIL) (2.5 MG/3ML) 0.083% nebulizer solution Take 3 mLs (2.5 mg total) by nebulization every 4 (four) hours as needed for wheezing or shortness of breath. 02/18/15  Yes Epifanio Lesches, MD  Aspirin-Acetaminophen-Caffeine (EXCEDRIN MIGRAINE PO) Take by mouth. Pt's sister stated pt took Excedrin for migraines in past week however she is unsure of dose   Yes Historical Provider, MD  atorvastatin (LIPITOR) 40 MG tablet TAKE ONE TABLET BY MOUTH ONCE DAILY 03/20/15  Yes Amy Cletis Athens, MD  butalbital-acetaminophen-caffeine (FIORICET) 50-325-40 MG tablet Take 1 tablet by mouth every 6 (six) hours as needed for headache. 04/14/15  Yes Clayton Bibles, PA-C  diazepam (VALIUM) 5 MG tablet TAKE ONE TABLET BY MOUTH EVERY 12 HOURS 06/05/15  Yes Amy  Cletis Athens, MD  feeding supplement, ENSURE ENLIVE, (ENSURE ENLIVE) LIQD Take 237 mLs by mouth 2 (two) times daily with a meal. 08/04/14  Yes Nicholes Mango, MD  FLUoxetine (PROZAC) 20 MG capsule Take 20 mg by mouth 2 (two) times daily.    Yes Historical Provider, MD  gabapentin (NEURONTIN) 300 MG capsule Take 3 capsules (900 mg total) by mouth 3 (three) times daily. 06/09/15  Yes Amy E Bedsole, MD  ibuprofen  (ADVIL,MOTRIN) 800 MG tablet TAKE ONE TABLET BY MOUTH EVERY 8 HOURS AS NEEDED 06/29/15  Yes Amy E Bedsole, MD  lisinopril (PRINIVIL,ZESTRIL) 10 MG tablet Take 10 mg by mouth daily.    Yes Historical Provider, MD  metoprolol succinate (TOPROL-XL) 25 MG 24 hr tablet Take 1 tablet by mouth daily. 06/01/15  Yes Historical Provider, MD  nystatin (MYCOSTATIN) powder Apply 1 g topically daily.    Yes Historical Provider, MD  omeprazole (PRILOSEC) 20 MG capsule Take 20 mg by mouth 2 (two) times daily.   Yes Historical Provider, MD      PHYSICAL EXAMINATION:   VITAL SIGNS: Blood pressure 191/104, pulse 105, temperature 100.1 F (37.8 C), temperature source Oral, resp. rate 22, weight 83.008 kg (183 lb), SpO2 100 %.  GENERAL:  55 y.o.-year-old patient lying in the bed with no acute distress.  EYES: Pupils equal, round, reactive to light and accommodation. No scleral icterus. Extraocular muscles intact.  HEENT: Head atraumatic, normocephalic. Oropharynx dry and nasopharynx clear.  NECK:  Supple, no jugular venous distention. No thyroid enlargement, no tenderness.  LUNGS: Normal breath sounds bilaterally, no wheezing, rales,rhonchi or crepitation. No use of accessory muscles of respiration.  CARDIOVASCULAR: S1, S2 normal. No murmurs, rubs, or gallops.  ABDOMEN: Soft, nontender, nondistended. Bowel sounds present. No organomegaly or mass.  EXTREMITIES: No pedal edema, cyanosis, or clubbing. Upper extremity contracture noted. NEUROLOGIC: Cranial nerves II through XII are intact. Muscle strength 1/5 in all extremities. Sensation intact. Gait not checked. Upper extremity contracture noted PSYCHIATRIC: The patient is alert and oriented x 3.  SKIN: No obvious rash, lesion, or ulcer.   LABORATORY PANEL:   CBC  Recent Labs Lab 07/05/15 2222  WBC 28.7*  HGB 13.2  HCT 41.8  PLT 397  MCV 75.9*  MCH 24.0*  MCHC 31.6*  RDW 15.2*  LYMPHSABS 1.7  MONOABS 2.9*  EOSABS 0.0  BASOSABS 0.3*    ------------------------------------------------------------------------------------------------------------------  Chemistries   Recent Labs Lab 07/05/15 2222  NA 138  K 3.7  CL 103  CO2 23  GLUCOSE 148*  BUN 30*  CREATININE 1.41*  CALCIUM 9.3  AST 14*  ALT 11*  ALKPHOS 88  BILITOT 1.8*   ------------------------------------------------------------------------------------------------------------------ estimated creatinine clearance is 61.1 mL/min (by C-G formula based on Cr of 1.41). ------------------------------------------------------------------------------------------------------------------ No results for input(s): TSH, T4TOTAL, T3FREE, THYROIDAB in the last 72 hours.  Invalid input(s): FREET3   Coagulation profile No results for input(s): INR, PROTIME in the last 168 hours. ------------------------------------------------------------------------------------------------------------------- No results for input(s): DDIMER in the last 72 hours. -------------------------------------------------------------------------------------------------------------------  Cardiac Enzymes No results for input(s): CKMB, TROPONINI, MYOGLOBIN in the last 168 hours.  Invalid input(s): CK ------------------------------------------------------------------------------------------------------------------ Invalid input(s): POCBNP  ---------------------------------------------------------------------------------------------------------------  Urinalysis    Component Value Date/Time   COLORURINE YELLOW* 07/05/2015 2222   COLORURINE Amber 10/29/2013 1415   APPEARANCEUR TURBID* 07/05/2015 2222   APPEARANCEUR Turbid 10/29/2013 1415   LABSPEC 1.016 07/05/2015 2222   LABSPEC 1.016 10/29/2013 1415   PHURINE 5.0 07/05/2015 2222   PHURINE 8.0 10/29/2013 1415  GLUCOSEU NEGATIVE 07/05/2015 2222   GLUCOSEU Negative 10/29/2013 1415   HGBUR 3+* 07/05/2015 2222   HGBUR Negative 10/29/2013  1415   BILIRUBINUR NEGATIVE 07/05/2015 2222   BILIRUBINUR negative 04/14/2015 1556   BILIRUBINUR Negative 10/29/2013 1415   KETONESUR 1+* 07/05/2015 2222   KETONESUR Negative 10/29/2013 1415   PROTEINUR 100* 07/05/2015 2222   PROTEINUR 1+ 04/14/2015 1556   PROTEINUR 100 mg/dL 10/29/2013 1415   UROBILINOGEN 0.2 04/14/2015 1556   UROBILINOGEN 4.0* 05/28/2011 1859   NITRITE NEGATIVE 07/05/2015 2222   NITRITE positive 04/14/2015 1556   NITRITE Negative 10/29/2013 1415   LEUKOCYTESUR 2+* 07/05/2015 2222   LEUKOCYTESUR 3+ 10/29/2013 1415     RADIOLOGY: Dg Chest Port 1 View  07/05/2015  CLINICAL DATA:  Fever and weakness. EXAM: PORTABLE CHEST 1 VIEW COMPARISON:  02/14/2015 FINDINGS: Since the previous study, there is interval removal of all appliances. Normal heart size and pulmonary vascularity. No focal airspace disease or consolidation in the lungs. No blunting of costophrenic angles. No pneumothorax. Mediastinal contours appear intact. Ectatic aorta. IMPRESSION: No active disease. Electronically Signed   By: Lucienne Capers M.D.   On: 07/05/2015 22:58    EKG: Orders placed or performed during the hospital encounter of 07/05/15  . ED EKG 12-Lead  . ED EKG 12-Lead  . EKG 12-Lead  . EKG 12-Lead    IMPRESSION AND PLAN: 55 year old male patient with history of multiple sclerosis, hypertension, GERD, decubitus ulcer presented to the emergency room for fever and weakness. Admitting diagnosis 1. Sepsis 2. Urinary tract infection 3. Dehydration 4. Multiple sclerosis 5. GERD 6. Hypertension 7.Leucocytosis  Treatment plan Admit patient to medical floor IV fluid hydration Start patient on IV vancomycin and IV cefepime antibiotics Follow-up WBC count DVT prophylaxis with subcutaneous Lovenox 40 MG daily Supportive care  All the records are reviewed and case discussed with ED provider. Management plans discussed with the patient, family and they are in agreement.  CODE  STATUS:FULL    Code Status Orders        Start     Ordered   07/06/15 0015  Full code   Continuous     07/06/15 0018    Code Status History    Date Active Date Inactive Code Status Order ID Comments User Context   03/16/2015  7:45 PM 03/19/2015  7:41 PM Full Code [id number]  Harrie Foreman, MD Inpatient   02/15/2015  9:30 PM 02/18/2015  6:34 PM Full Code [id number]  Silver Huguenin, RN Inpatient   01/29/2015  9:46 AM 02/02/2015  6:20 PM Full Code [id number]  Saundra Shelling, MD Inpatient   11/03/2014  8:04 AM 11/05/2014  2:36 PM Full Code [id number]  Harrie Foreman, MD Inpatient   08/01/2014  3:18 PM 08/04/2014  6:26 PM Full Code [id number]  Epifanio Lesches, MD ED   06/15/2014  1:36 AM 06/18/2014  8:33 PM Full Code [id number]  Harrie Foreman, MD ED       TOTAL TIME TAKING CARE OF THIS PATIENT: 53 minutes.    Saundra Shelling M.D on 07/06/2015 at 12:18 AM  Between 7am to 6pm - Pager - [phone]  After 6pm go to [email or web address] - password [email or web address] [facility]  Office  [phone]  CC: Primary care physician; Eliezer Lofts, MD

## 2015-07-07 LAB — BLOOD CULTURE ID PANEL (REFLEXED)
ACINETOBACTER BAUMANNII: NOT DETECTED
CANDIDA ALBICANS: NOT DETECTED
CANDIDA KRUSEI: NOT DETECTED
CANDIDA PARAPSILOSIS: NOT DETECTED
CARBAPENEM RESISTANCE: NOT DETECTED
Candida glabrata: NOT DETECTED
Candida tropicalis: NOT DETECTED
ENTEROBACTER CLOACAE COMPLEX: NOT DETECTED
ENTEROBACTERIACEAE SPECIES: NOT DETECTED
ESCHERICHIA COLI: NOT DETECTED
Enterococcus species: NOT DETECTED
Haemophilus influenzae: NOT DETECTED
KLEBSIELLA OXYTOCA: NOT DETECTED
KLEBSIELLA PNEUMONIAE: NOT DETECTED
Listeria monocytogenes: NOT DETECTED
Methicillin resistance: DETECTED — AB
NEISSERIA MENINGITIDIS: NOT DETECTED
PSEUDOMONAS AERUGINOSA: NOT DETECTED
Proteus species: NOT DETECTED
STAPHYLOCOCCUS SPECIES: DETECTED — AB
STREPTOCOCCUS AGALACTIAE: NOT DETECTED
STREPTOCOCCUS PNEUMONIAE: NOT DETECTED
STREPTOCOCCUS SPECIES: NOT DETECTED
Serratia marcescens: NOT DETECTED
Staphylococcus aureus (BCID): NOT DETECTED
Streptococcus pyogenes: NOT DETECTED
Vancomycin resistance: NOT DETECTED

## 2015-07-07 LAB — CBC
HEMATOCRIT: 34.8 % — AB (ref 40.0–52.0)
Hemoglobin: 10.9 g/dL — ABNORMAL LOW (ref 13.0–18.0)
MCH: 24.4 pg — AB (ref 26.0–34.0)
MCHC: 31.3 g/dL — AB (ref 32.0–36.0)
MCV: 77.9 fL — AB (ref 80.0–100.0)
Platelets: 283 10*3/uL (ref 150–440)
RBC: 4.47 MIL/uL (ref 4.40–5.90)
RDW: 15.6 % — AB (ref 11.5–14.5)
WBC: 11 10*3/uL — ABNORMAL HIGH (ref 3.8–10.6)

## 2015-07-07 LAB — URINE CULTURE

## 2015-07-07 MED ORDER — NYSTATIN 100000 UNIT/ML MT SUSP
5.0000 mL | Freq: Four times a day (QID) | OROMUCOSAL | Status: DC
  Administered 2015-07-07 – 2015-07-09 (×8): 500000 [IU] via ORAL
  Filled 2015-07-07 (×8): qty 5

## 2015-07-07 MED ORDER — VANCOMYCIN HCL IN DEXTROSE 1-5 GM/200ML-% IV SOLN
1000.0000 mg | Freq: Two times a day (BID) | INTRAVENOUS | Status: DC
  Administered 2015-07-07 (×2): 1000 mg via INTRAVENOUS
  Filled 2015-07-07 (×2): qty 200

## 2015-07-07 MED ORDER — SODIUM CHLORIDE 0.9 % IV SOLN
3.0000 g | Freq: Four times a day (QID) | INTRAVENOUS | Status: DC
  Administered 2015-07-07 – 2015-07-09 (×7): 3 g via INTRAVENOUS
  Filled 2015-07-07 (×11): qty 3

## 2015-07-07 NOTE — Care Management (Signed)
Admitted to [facility] with the diagnosis of urinary tract infection. Lives with roommate, Leamon Arnt. States may call Leonette Most, if needed. Step daughter is [name] [phone]) Daughter is Pearlie Oyster [phone]). Last seen Dr. Ermalene Searing at [facility].  04/24/15. Sees Dr. Elwyn Reach at [facility] for urology needs. [facility] for nursing services last March. (Nursing, speech, Child psychotherapist). No skilled facility. No home oxygen. Mobil wheelchair, hospital bed, hoyer lift, and ramps at the home. Uses [facility] and [facility] for transportation. Roommate helps with activities of daily living, Family and friends help with errands. No falls. Good appetite. Chronic foley. States his step daughter changes his foley. Last changed when admitted to this facility. Physician advised to change foley every 4 weeks. Will need to be transported per [facility] unit when discharged.  Gwenette Greet RN MSN CCM Care management [phone]

## 2015-07-07 NOTE — Consult Note (Signed)
[facility] Infectious Disease     Reason for Consult:UTI, [place] bacteremia   Referring Physician: Earleen Newport, Alfonso Patten Date of Admission:  07/05/2015   Active Problems:   UTI (lower urinary tract infection)   HPI: Travis Cantrell is a 54 y.o. male admitted 6.11 with fevers and weakness and chills. He is mainly bedbound and dependent on ADLS and has hx MS, chronic foley cath.  On admit found to have hypotension, wbc 28, temp 100.1 and Ua with TNTC wbc.  UCX is mixed. Travis Cantrell is growing staph species (Not Staph aureus).  Started vanco and zosyn. Wbc down to 11.   Past Medical History  Diagnosis Date  . Allergy   . GERD (gastroesophageal reflux disease)   . MS (multiple sclerosis) ([place])   . Hypertension   . Decubitus ulcer    Past Surgical History  Procedure Laterality Date  . Vein reconsruction    . Rotator cuff repair     Social History  Substance Use Topics  . Smoking status: Never Smoker   . Smokeless tobacco: Never Used  . Alcohol Use: No   Family History  Problem Relation Age of Onset  . Cancer Mother     multiple myeloma  . Diabetes Father   . Heart attack Father     Allergies:  Allergies  Allergen Reactions  . Baclofen Diarrhea    Current antibiotics: Antibiotics Given (last 72 hours)    Date/Time Action Medication Dose Rate   07/06/15 0240 Given   piperacillin-tazobactam (ZOSYN) IVPB 3.375 g 3.375 g 12.5 mL/hr   07/06/15 0240 Given   vancomycin (VANCOCIN) IVPB 1000 mg/200 mL premix 1,000 mg 200 mL/hr   07/06/15 0912 Given   vancomycin (VANCOCIN) IVPB 750 mg/150 ml premix 750 mg 150 mL/hr   07/06/15 1109 Given   piperacillin-tazobactam (ZOSYN) IVPB 3.375 g 3.375 g 12.5 mL/hr   07/06/15 1733 Given   piperacillin-tazobactam (ZOSYN) IVPB 3.375 g 3.375 g 12.5 mL/hr   07/07/15 0142 Given   piperacillin-tazobactam (ZOSYN) IVPB 3.375 g 3.375 g 12.5 mL/hr   07/07/15 0233 Given   vancomycin (VANCOCIN) IVPB 1000 mg/200 mL premix 1,000 mg 200 mL/hr   07/07/15 1059 Given   piperacillin-tazobactam (ZOSYN) IVPB 3.375 g 3.375 g 12.5 mL/hr      MEDICATIONS: . atorvastatin  40 mg Oral QHS  . diazepam  5 mg Oral Q12H  . enoxaparin (LOVENOX) injection  40 mg Subcutaneous Q24H  . feeding supplement (ENSURE ENLIVE)  237 mL Oral BID WC  . FLUoxetine  20 mg Oral Daily   And  . FLUoxetine  20 mg Oral QHS  . gabapentin  900 mg Oral TID  . lisinopril  10 mg Oral Daily  . metoprolol succinate  25 mg Oral Daily  . nystatin  5 mL Oral QID  . nystatin  1 g Topical QID  . pantoprazole  40 mg Oral QAC breakfast  . piperacillin-tazobactam  3.375 g Intravenous Q8H  . vancomycin  1,000 mg Intravenous Q12H    Review of Systems - 11 systems reviewed and negative per HPI   OBJECTIVE: Temp:  [97.9 F (36.6 C)-99.6 F (37.6 C)] 98 F (36.7 C) (06/13 1247) Pulse Rate:  [69-75] 70 (06/13 1247) Resp:  [16-20] 18 (06/13 1247) BP: (107-137)/(54-84) 115/72 mmHg (06/13 1247) SpO2:  [95 %-100 %] 100 % (06/13 1247) Physical Exam  Constitutional: sleepy, chroniclly ill appearing HENT: anicteric Mouth/Throat: Oropharynx is clear and moist. No oropharyngeal exudate.  Cardiovascular: Normal rate, regular rhythm and normal  heart sounds. EPulmonary/Chest: Effort normal and breath sounds normal. No respiratory distress. He has no wheezes.  Abdominal: Soft. Bowel sounds are normal. He exhibits no distension. There is no tenderness.  Chronci foley in place, dark urine Neurological:sleepy, contracture R arm Skin: Skin is warm and dry. No rash noted. No erythema.   LABS: Results for orders placed or performed during the hospital encounter of 07/05/15 (from the past 48 hour(s))  Comprehensive metabolic panel     Status: Abnormal   Collection Time: 07/05/15 10:22 PM  Result Value Ref Range   Sodium 138 135 - 145 mmol/L   Potassium 3.7 3.5 - 5.1 mmol/L   Chloride 103 101 - 111 mmol/L   CO2 23 22 - 32 mmol/L   Glucose, Bld 148 (H) 65 - 99 mg/dL   BUN 30 (H) 6 - 20 mg/dL    Creatinine, Ser 1.41 (H) 0.61 - 1.24 mg/dL   Calcium 9.3 8.9 - 10.3 mg/dL   Total Protein 7.8 6.5 - 8.1 g/dL   Albumin 3.6 3.5 - 5.0 g/dL   AST 14 (L) 15 - 41 U/L   ALT 11 (L) 17 - 63 U/L   Alkaline Phosphatase 88 38 - 126 U/L   Total Bilirubin 1.8 (H) 0.3 - 1.2 mg/dL   GFR calc non Af Amer 55 (L) >60 mL/min   GFR calc Af Amer >60 >60 mL/min    Comment: (NOTE) The eGFR has been calculated using the CKD EPI equation. This calculation has not been validated in all clinical situations. eGFR's persistently <60 mL/min signify possible Chronic Kidney Disease.    Anion gap 12 5 - 15  CBC WITH DIFFERENTIAL     Status: Abnormal   Collection Time: 07/05/15 10:22 PM  Result Value Ref Range   WBC 28.7 (H) 3.8 - 10.6 K/uL   RBC 5.51 4.40 - 5.90 MIL/uL   Hemoglobin 13.2 13.0 - 18.0 g/dL   HCT 41.8 40.0 - 52.0 %   MCV 75.9 (L) 80.0 - 100.0 fL   MCH 24.0 (L) 26.0 - 34.0 pg   MCHC 31.6 (L) 32.0 - 36.0 g/dL   RDW 15.2 (H) 11.5 - 14.5 %   Platelets 397 150 - 440 K/uL   Neutrophils Relative % 83 %   Lymphocytes Relative 6 %   Monocytes Relative 10 %   Eosinophils Relative 0 %   Basophils Relative 1 %   Neutro Abs 23.8 (H) 1.4 - 6.5 K/uL   Lymphs Abs 1.7 1.0 - 3.6 K/uL   Monocytes Absolute 2.9 (H) 0.2 - 1.0 K/uL   Eosinophils Absolute 0.0 0 - 0.7 K/uL   Basophils Absolute 0.3 (H) 0 - 0.1 K/uL   RBC Morphology MIXED RBC POPULATION   Blood Culture (routine x 2)     Status: None (Preliminary result)   Collection Time: 07/05/15 10:22 PM  Result Value Ref Range   Specimen Description BLOOD RIGHT HAND    Special Requests BOTTLES DRAWN AEROBIC AND ANAEROBIC 7CCAERO,5CCANA    Culture  Setup Time      GRAM POSITIVE COCCI AEROBIC BOTTLE ONLY CRITICAL RESULT CALLED TO, READ BACK BY AND VERIFIED WITH: MATT MCBANE @ 0109 ON 07/07/2015 BY [facility] CONFIRM BY [place] Organism ID to follow    Culture GRAM POSITIVE COCCI    Report Status PENDING   Urine culture     Status: Abnormal   Collection Time:  07/05/15 10:22 PM  Result Value Ref Range   Specimen Description URINE, CLEAN CATCH  Special Requests NONE    Culture MULTIPLE SPECIES PRESENT, SUGGEST RECOLLECTION (A)    Report Status 07/07/2015 FINAL   Urinalysis complete, with microscopic ([facility] only)     Status: Abnormal   Collection Time: 07/05/15 10:22 PM  Result Value Ref Range   Color, Urine YELLOW (A) YELLOW   APPearance TURBID (A) CLEAR   Glucose, UA NEGATIVE NEGATIVE mg/dL   Bilirubin Urine NEGATIVE NEGATIVE   Ketones, ur 1+ (A) NEGATIVE mg/dL   Specific Gravity, Urine 1.016 1.005 - 1.030   Hgb urine dipstick 3+ (A) NEGATIVE   pH 5.0 5.0 - 8.0   Protein, ur 100 (A) NEGATIVE mg/dL   Nitrite NEGATIVE NEGATIVE   Leukocytes, UA 2+ (A) NEGATIVE   RBC / HPF TOO NUMEROUS TO COUNT 0 - 5 RBC/hpf   WBC, UA TOO NUMEROUS TO COUNT 0 - 5 WBC/hpf   Bacteria, UA MANY (A) NONE SEEN   Squamous Epithelial / LPF NONE SEEN NONE SEEN   WBC Clumps PRESENT    Mucous PRESENT   Blood Culture ID Panel (Reflexed)     Status: Abnormal   Collection Time: 07/05/15 10:22 PM  Result Value Ref Range   Enterococcus species NOT DETECTED NOT DETECTED   Vancomycin resistance NOT DETECTED NOT DETECTED   Listeria monocytogenes NOT DETECTED NOT DETECTED   Staphylococcus species DETECTED (A) NOT DETECTED    Comment: CRITICAL RESULT CALLED TO, READ BACK BY AND VERIFIED WITH: MATT MCBANE @ 0120 ON 07/07/2015 BY [facility]    Staphylococcus aureus NOT DETECTED NOT DETECTED   Methicillin resistance DETECTED (A) NOT DETECTED    Comment: CRITICAL RESULT CALLED TO, READ BACK BY AND VERIFIED WITH: MATT MCBANE @ 0120 ON 07/07/2015 BY [facility]F    Streptococcus species NOT DETECTED NOT DETECTED   Streptococcus agalactiae NOT DETECTED NOT DETECTED   Streptococcus pneumoniae NOT DETECTED NOT DETECTED   Streptococcus pyogenes NOT DETECTED NOT DETECTED   Acinetobacter baumannii NOT DETECTED NOT DETECTED   Enterobacteriaceae species NOT DETECTED NOT DETECTED   Enterobacter  cloacae complex NOT DETECTED NOT DETECTED   Escherichia coli NOT DETECTED NOT DETECTED   Klebsiella oxytoca NOT DETECTED NOT DETECTED   Klebsiella pneumoniae NOT DETECTED NOT DETECTED   Proteus species NOT DETECTED NOT DETECTED   Serratia marcescens NOT DETECTED NOT DETECTED   Carbapenem resistance NOT DETECTED NOT DETECTED   Haemophilus influenzae NOT DETECTED NOT DETECTED   Neisseria meningitidis NOT DETECTED NOT DETECTED   Pseudomonas aeruginosa NOT DETECTED NOT DETECTED   Candida albicans NOT DETECTED NOT DETECTED   Candida glabrata NOT DETECTED NOT DETECTED   Candida krusei NOT DETECTED NOT DETECTED   Candida parapsilosis NOT DETECTED NOT DETECTED   Candida tropicalis NOT DETECTED NOT DETECTED  Lactic acid, plasma     Status: None   Collection Time: 07/05/15 10:23 PM  Result Value Ref Range   Lactic Acid, Venous 1.6 0.5 - 2.0 mmol/L  Blood Culture (routine x 2)     Status: None (Preliminary result)   Collection Time: 07/05/15 10:36 PM  Result Value Ref Range   Specimen Description BLOOD LEFT ASSIST CONTROL    Special Requests      BOTTLES DRAWN AEROBIC AND ANAEROBIC 14CCAERO,16CCANA   Culture NO GROWTH 2 DAYS    Report Status PENDING   Lactic acid, plasma     Status: None   Collection Time: 07/06/15  2:05 AM  Result Value Ref Range   Lactic Acid, Venous 1.8 0.5 - 2.0 mmol/L  CBC  Status: Abnormal   Collection Time: 07/06/15  2:05 AM  Result Value Ref Range   WBC 22.8 (H) 3.8 - 10.6 K/uL   RBC 5.11 4.40 - 5.90 MIL/uL   Hemoglobin 12.5 (L) 13.0 - 18.0 g/dL   HCT 39.7 (L) 40.0 - 52.0 %   MCV 77.6 (L) 80.0 - 100.0 fL   MCH 24.5 (L) 26.0 - 34.0 pg   MCHC 31.6 (L) 32.0 - 36.0 g/dL   RDW 15.6 (H) 11.5 - 14.5 %   Platelets 319 150 - 440 K/uL  Comprehensive metabolic panel     Status: Abnormal   Collection Time: 07/06/15  2:05 AM  Result Value Ref Range   Sodium 140 135 - 145 mmol/L   Potassium 3.7 3.5 - 5.1 mmol/L   Chloride 108 101 - 111 mmol/L   CO2 21 (L) 22 - 32  mmol/L   Glucose, Bld 127 (H) 65 - 99 mg/dL   BUN 26 (H) 6 - 20 mg/dL   Creatinine, Ser 1.26 (H) 0.61 - 1.24 mg/dL   Calcium 8.4 (L) 8.9 - 10.3 mg/dL   Total Protein 7.0 6.5 - 8.1 g/dL   Albumin 3.2 (L) 3.5 - 5.0 g/dL   AST 13 (L) 15 - 41 U/L   ALT 9 (L) 17 - 63 U/L   Alkaline Phosphatase 79 38 - 126 U/L   Total Bilirubin 1.8 (H) 0.3 - 1.2 mg/dL   GFR calc non Af Amer >60 >60 mL/min   GFR calc Af Amer >60 >60 mL/min    Comment: (NOTE) The eGFR has been calculated using the CKD EPI equation. This calculation has not been validated in all clinical situations. eGFR's persistently <60 mL/min signify possible Chronic Kidney Disease.    Anion gap 11 5 - 15  Procalcitonin     Status: None   Collection Time: 07/06/15  2:05 AM  Result Value Ref Range   Procalcitonin 0.17 ng/mL    Comment:        Interpretation: PCT (Procalcitonin) <= 0.5 ng/mL: Systemic infection (sepsis) is not likely. Local bacterial infection is possible. (NOTE)         ICU PCT Algorithm               Non ICU PCT Algorithm    ----------------------------     ------------------------------         PCT < 0.25 ng/mL                 PCT < 0.1 ng/mL     Stopping of antibiotics            Stopping of antibiotics       strongly encouraged.               strongly encouraged.    ----------------------------     ------------------------------       PCT level decrease by               PCT < 0.25 ng/mL       >= 80% from peak PCT       OR PCT 0.25 - 0.5 ng/mL          Stopping of antibiotics                                             encouraged.     Stopping of antibiotics  encouraged.    ----------------------------     ------------------------------       PCT level decrease by              PCT >= 0.25 ng/mL       < 80% from peak PCT        AND PCT >= 0.5 ng/mL            Continuin g antibiotics                                              encouraged.       Continuing antibiotics            encouraged.     ----------------------------     ------------------------------     PCT level increase compared          PCT > 0.5 ng/mL         with peak PCT AND          PCT >= 0.5 ng/mL             Escalation of antibiotics                                          strongly encouraged.      Escalation of antibiotics        strongly encouraged.   Protime-INR     Status: Abnormal   Collection Time: 07/06/15  2:05 AM  Result Value Ref Range   Prothrombin Time 17.3 (H) 11.4 - 15.0 seconds   INR 1.40   APTT     Status: Abnormal   Collection Time: 07/06/15  2:05 AM  Result Value Ref Range   aPTT 42 (H) 24 - 36 seconds    Comment:        IF BASELINE aPTT IS ELEVATED, SUGGEST PATIENT RISK ASSESSMENT BE USED TO DETERMINE APPROPRIATE ANTICOAGULANT THERAPY.   MRSA PCR Screening     Status: None   Collection Time: 07/06/15  2:45 AM  Result Value Ref Range   MRSA by PCR NEGATIVE NEGATIVE    Comment:        The GeneXpert MRSA Assay (FDA approved for NASAL specimens only), is one component of a comprehensive MRSA colonization surveillance program. It is not intended to diagnose MRSA infection nor to guide or monitor treatment for MRSA infections.   CBC     Status: Abnormal   Collection Time: 07/07/15  7:21 AM  Result Value Ref Range   WBC 11.0 (H) 3.8 - 10.6 K/uL   RBC 4.47 4.40 - 5.90 MIL/uL   Hemoglobin 10.9 (L) 13.0 - 18.0 g/dL   HCT 34.8 (L) 40.0 - 52.0 %   MCV 77.9 (L) 80.0 - 100.0 fL   MCH 24.4 (L) 26.0 - 34.0 pg   MCHC 31.3 (L) 32.0 - 36.0 g/dL   RDW 15.6 (H) 11.5 - 14.5 %   Platelets 283 150 - 440 K/uL   No components found for: ESR, C REACTIVE PROTEIN MICRO: Recent Results (from the past 720 hour(s))  Blood Culture (routine x 2)     Status: None (Preliminary result)   Collection Time: 07/05/15 10:22 PM  Result Value Ref Range Status   Specimen Description BLOOD RIGHT HAND  Final  Special Requests BOTTLES DRAWN AEROBIC AND ANAEROBIC [place]  Final   Culture  Setup Time    Final    GRAM POSITIVE COCCI AEROBIC BOTTLE ONLY CRITICAL RESULT CALLED TO, READ BACK BY AND VERIFIED WITH: MATT MCBANE @ 3235 ON 07/07/2015 BY CAF CONFIRM BY [place] Organism ID to follow    Culture GRAM POSITIVE COCCI  Final   Report Status PENDING  Incomplete  Urine culture     Status: Abnormal   Collection Time: 07/05/15 10:22 PM  Result Value Ref Range Status   Specimen Description URINE, CLEAN CATCH  Final   Special Requests NONE  Final   Culture MULTIPLE SPECIES PRESENT, SUGGEST RECOLLECTION (A)  Final   Report Status 07/07/2015 FINAL  Final  Blood Culture ID Panel (Reflexed)     Status: Abnormal   Collection Time: 07/05/15 10:22 PM  Result Value Ref Range Status   Enterococcus species NOT DETECTED NOT DETECTED Final   Vancomycin resistance NOT DETECTED NOT DETECTED Final   Listeria monocytogenes NOT DETECTED NOT DETECTED Final   Staphylococcus species DETECTED (A) NOT DETECTED Final    Comment: CRITICAL RESULT CALLED TO, READ BACK BY AND VERIFIED WITH: MATT MCBANE @ 0120 ON 07/07/2015 BY CAF    Staphylococcus aureus NOT DETECTED NOT DETECTED Final   Methicillin resistance DETECTED (A) NOT DETECTED Final    Comment: CRITICAL RESULT CALLED TO, READ BACK BY AND VERIFIED WITH: MATT MCBANE @ 0120 ON 07/07/2015 BY [facility]F    Streptococcus species NOT DETECTED NOT DETECTED Final   Streptococcus agalactiae NOT DETECTED NOT DETECTED Final   Streptococcus pneumoniae NOT DETECTED NOT DETECTED Final   Streptococcus pyogenes NOT DETECTED NOT DETECTED Final   Acinetobacter baumannii NOT DETECTED NOT DETECTED Final   Enterobacteriaceae species NOT DETECTED NOT DETECTED Final   Enterobacter cloacae complex NOT DETECTED NOT DETECTED Final   Escherichia coli NOT DETECTED NOT DETECTED Final   Klebsiella oxytoca NOT DETECTED NOT DETECTED Final   Klebsiella pneumoniae NOT DETECTED NOT DETECTED Final   Proteus species NOT DETECTED NOT DETECTED Final   Serratia marcescens NOT DETECTED NOT DETECTED  Final   Carbapenem resistance NOT DETECTED NOT DETECTED Final   Haemophilus influenzae NOT DETECTED NOT DETECTED Final   Neisseria meningitidis NOT DETECTED NOT DETECTED Final   Pseudomonas aeruginosa NOT DETECTED NOT DETECTED Final   Candida albicans NOT DETECTED NOT DETECTED Final   Candida glabrata NOT DETECTED NOT DETECTED Final   Candida krusei NOT DETECTED NOT DETECTED Final   Candida parapsilosis NOT DETECTED NOT DETECTED Final   Candida tropicalis NOT DETECTED NOT DETECTED Final  Blood Culture (routine x 2)     Status: None (Preliminary result)   Collection Time: 07/05/15 10:36 PM  Result Value Ref Range Status   Specimen Description BLOOD LEFT ASSIST CONTROL  Final   Special Requests   Final    BOTTLES DRAWN AEROBIC AND ANAEROBIC 14CCAERO,16CCANA   Culture NO GROWTH 2 DAYS  Final   Report Status PENDING  Incomplete  MRSA PCR Screening     Status: None   Collection Time: 07/06/15  2:45 AM  Result Value Ref Range Status   MRSA by PCR NEGATIVE NEGATIVE Final    Comment:        The GeneXpert MRSA Assay (FDA approved for NASAL specimens only), is one component of a comprehensive MRSA colonization surveillance program. It is not intended to diagnose MRSA infection nor to guide or monitor treatment for MRSA infections.     IMAGING: Dg Chest [facility]  1 View  07/05/2015  CLINICAL DATA:  Fever and weakness. EXAM: PORTABLE CHEST 1 VIEW COMPARISON:  02/14/2015 FINDINGS: Since the previous study, there is interval removal of all appliances. Normal heart size and pulmonary vascularity. No focal airspace disease or consolidation in the lungs. No blunting of costophrenic angles. No pneumothorax. Mediastinal contours appear intact. Ectatic aorta. IMPRESSION: No active disease. Electronically Signed   By: Lucienne Capers M.D.   On: 07/05/2015 22:58    Assessment:   Travis Cantrell is a 55 y.o. male with recurrent UTI, in setting of chronic foley cath from [place].  Culture now mixed. His staph  species on bcx is most likely contaminant as is not staph aureus Most recent UTI 04/17/15 with proteus R to amp, keflex, cipro, bactrim, macrobid Recommendations Can dc vanco  Change to unasyn and if stable on this can dc on augmentin with a 10 day course Foley has been changed   Thank you very much for allowing me to participate in the care of this patient. Please call with questions.   Cheral Marker. Ola Spurr, MD

## 2015-07-07 NOTE — Progress Notes (Signed)
Patient ID: Travis Cantrell, male   DOB: 1960/03/08, 55 y.o.   MRN: [id number] [employer] [facility] PROGRESS NOTE  Travis Cantrell VHQ:[id number] DOB: December 17, 1960 DOA: 07/05/2015 PCP: Kerby Nora, MD  HPI/Subjective: Patient feeling better today. He is not up for sitting in the chair. Eating better.  Objective: Filed Vitals:   07/07/15 1035 07/07/15 1247  BP: 128/74 115/72  Pulse: 71 70  Temp: 98.4 F (36.9 C) 98 F (36.7 C)  Resp: 18 18    Filed Weights   07/05/15 2220  Weight: 83.008 kg (183 lb)    ROS: Review of Systems  Constitutional: Negative for fever and chills.  Eyes: Negative for blurred vision.  Respiratory: Negative for cough and shortness of breath.   Cardiovascular: Negative for chest pain.  Gastrointestinal: Negative for nausea, vomiting, abdominal pain, diarrhea and constipation.  Genitourinary: Negative for dysuria.  Musculoskeletal: Negative for joint pain.  Neurological: Negative for dizziness and headaches.   Exam: Physical Exam  Constitutional: He is oriented to person, place, and time.  HENT:  Nose: No mucosal edema.  Mouth/Throat: No oropharyngeal exudate or posterior oropharyngeal edema.  Eyes: Conjunctivae, EOM and lids are normal. Pupils are equal, round, and reactive to light.  Neck: No JVD present. Carotid bruit is not present. No edema present. No thyroid mass and no thyromegaly present.  Cardiovascular: S1 normal and S2 normal.  Exam reveals no gallop.   No murmur heard. Pulses:      Dorsalis pedis pulses are 2+ on the right side, and 2+ on the left side.  Respiratory: No respiratory distress. He has no wheezes. He has no rhonchi. He has no rales.  GI: Soft. Bowel sounds are normal. There is no tenderness.  Musculoskeletal:       Right ankle: He exhibits swelling.       Left ankle: He exhibits swelling.  Lymphadenopathy:    He has no cervical adenopathy.  Neurological: He is alert and oriented to person, place, and time.  Contractions on hands  and feet  Skin: Skin is warm. Nails show no clubbing.  Chronic lower extremity discoloration and some scaling  Psychiatric: He has a normal mood and affect.      Data Reviewed: Basic Metabolic Panel:  Recent Labs Lab 07/05/15 2222 07/06/15 0205  NA 138 140  K 3.7 3.7  CL 103 108  CO2 23 21*  GLUCOSE 148* 127*  BUN 30* 26*  CREATININE 1.41* 1.26*  CALCIUM 9.3 8.4*   Liver Function Tests:  Recent Labs Lab 07/05/15 2222 07/06/15 0205  AST 14* 13*  ALT 11* 9*  ALKPHOS 88 79  BILITOT 1.8* 1.8*  PROT 7.8 7.0  ALBUMIN 3.6 3.2*   CBC:  Recent Labs Lab 07/05/15 2222 07/06/15 0205 07/07/15 0721  WBC 28.7* 22.8* 11.0*  NEUTROABS 23.8*  --   --   HGB 13.2 12.5* 10.9*  HCT 41.8 39.7* 34.8*  MCV 75.9* 77.6* 77.9*  PLT 397 319 283     Recent Results (from the past 240 hour(s))  Blood Culture (routine x 2)     Status: None (Preliminary result)   Collection Time: 07/05/15 10:22 PM  Result Value Ref Range Status   Specimen Description BLOOD RIGHT HAND  Final   Special Requests BOTTLES DRAWN AEROBIC AND ANAEROBIC 7CCAERO,5CCANA  Final   Culture  Setup Time   Final    GRAM POSITIVE COCCI AEROBIC BOTTLE ONLY CRITICAL RESULT CALLED TO, READ BACK BY AND VERIFIED WITH: MATT MCBANE @ 0120 ON  07/07/2015 BY CAF CONFIRM BY [facility] Organism ID to follow    Culture GRAM POSITIVE COCCI  Final   Report Status PENDING  Incomplete  Urine culture     Status: Abnormal   Collection Time: 07/05/15 10:22 PM  Result Value Ref Range Status   Specimen Description URINE, CLEAN CATCH  Final   Special Requests NONE  Final   Culture MULTIPLE SPECIES PRESENT, SUGGEST RECOLLECTION (A)  Final   Report Status 07/07/2015 FINAL  Final  Blood Culture ID Panel (Reflexed)     Status: Abnormal   Collection Time: 07/05/15 10:22 PM  Result Value Ref Range Status   Enterococcus species NOT DETECTED NOT DETECTED Final   Vancomycin resistance NOT DETECTED NOT DETECTED Final   Listeria monocytogenes  NOT DETECTED NOT DETECTED Final   Staphylococcus species DETECTED (A) NOT DETECTED Final    Comment: CRITICAL RESULT CALLED TO, READ BACK BY AND VERIFIED WITH: MATT MCBANE @ 0120 ON 07/07/2015 BY CAF    Staphylococcus aureus NOT DETECTED NOT DETECTED Final   Methicillin resistance DETECTED (A) NOT DETECTED Final    Comment: CRITICAL RESULT CALLED TO, READ BACK BY AND VERIFIED WITH: MATT MCBANE @ 0120 ON 07/07/2015 BY CAF    Streptococcus species NOT DETECTED NOT DETECTED Final   Streptococcus agalactiae NOT DETECTED NOT DETECTED Final   Streptococcus pneumoniae NOT DETECTED NOT DETECTED Final   Streptococcus pyogenes NOT DETECTED NOT DETECTED Final   Acinetobacter baumannii NOT DETECTED NOT DETECTED Final   Enterobacteriaceae species NOT DETECTED NOT DETECTED Final   Enterobacter cloacae complex NOT DETECTED NOT DETECTED Final   Escherichia coli NOT DETECTED NOT DETECTED Final   Klebsiella oxytoca NOT DETECTED NOT DETECTED Final   Klebsiella pneumoniae NOT DETECTED NOT DETECTED Final   Proteus species NOT DETECTED NOT DETECTED Final   Serratia marcescens NOT DETECTED NOT DETECTED Final   Carbapenem resistance NOT DETECTED NOT DETECTED Final   Haemophilus influenzae NOT DETECTED NOT DETECTED Final   Neisseria meningitidis NOT DETECTED NOT DETECTED Final   Pseudomonas aeruginosa NOT DETECTED NOT DETECTED Final   Candida albicans NOT DETECTED NOT DETECTED Final   Candida glabrata NOT DETECTED NOT DETECTED Final   Candida krusei NOT DETECTED NOT DETECTED Final   Candida parapsilosis NOT DETECTED NOT DETECTED Final   Candida tropicalis NOT DETECTED NOT DETECTED Final  Blood Culture (routine x 2)     Status: None (Preliminary result)   Collection Time: 07/05/15 10:36 PM  Result Value Ref Range Status   Specimen Description BLOOD LEFT ASSIST CONTROL  Final   Special Requests   Final    BOTTLES DRAWN AEROBIC AND ANAEROBIC 14CCAERO,16CCANA   Culture NO GROWTH 2 DAYS  Final   Report Status  PENDING  Incomplete  MRSA PCR Screening     Status: None   Collection Time: 07/06/15  2:45 AM  Result Value Ref Range Status   MRSA by PCR NEGATIVE NEGATIVE Final    Comment:        The GeneXpert MRSA Assay (FDA approved for NASAL specimens only), is one component of a comprehensive MRSA colonization surveillance program. It is not intended to diagnose MRSA infection nor to guide or monitor treatment for MRSA infections.      Studies: Dg Chest Port 1 View  07/05/2015  CLINICAL DATA:  Fever and weakness. EXAM: PORTABLE CHEST 1 VIEW COMPARISON:  02/14/2015 FINDINGS: Since the previous study, there is interval removal of all appliances. Normal heart size and pulmonary vascularity. No focal airspace disease or  consolidation in the lungs. No blunting of costophrenic angles. No pneumothorax. Mediastinal contours appear intact. Ectatic aorta. IMPRESSION: No active disease. Electronically Signed   By: Burman Nieves M.D.   On: 07/05/2015 22:58    Scheduled Meds: . atorvastatin  40 mg Oral QHS  . diazepam  5 mg Oral Q12H  . enoxaparin (LOVENOX) injection  40 mg Subcutaneous Q24H  . feeding supplement (ENSURE ENLIVE)  237 mL Oral BID WC  . FLUoxetine  20 mg Oral Daily   And  . FLUoxetine  20 mg Oral QHS  . gabapentin  900 mg Oral TID  . lisinopril  10 mg Oral Daily  . metoprolol succinate  25 mg Oral Daily  . nystatin  5 mL Oral QID  . nystatin  1 g Topical QID  . pantoprazole  40 mg Oral QAC breakfast  . piperacillin-tazobactam  3.375 g Intravenous Q8H  . vancomycin  1,000 mg Intravenous Q12H   Continuous Infusions: . sodium chloride 75 mL/hr at 07/07/15 1106    Assessment/Plan:  1. Clinical sepsisPresent on admission. Blood cultures positive for resistant staph species. IV vancomycin restarted yesterday. Await final sensitivities. We'll get infectious disease consultation. Acute cystitis with hematuria with chronic catheter, leukocytosis, fever and tachycardia. Continue IV  Zosyn for now. Foley catheter changed in the ER. Urine culture with numerous organisms and suggest  recollection. Since he has a chronic Foley I will not get a recollection. Get another set of blood cultures today. 2. History of multiple sclerosis 3. Essential hypertension continue lisinopril metoprolol 4. Acute kidney injury continue IV fluid hydration and monitor kidney function 5. Hyperlipidemia unspecified on atorvastatin 6. Anxiety on fluoxetine and Valium 7. GERD on Protonix  Code Status:     Code Status Orders        Start     Ordered   07/06/15 0015  Full code   Continuous     07/06/15 0018    Code Status History    Date Active Date Inactive Code Status Order ID Comments User Context   03/16/2015  7:45 PM 03/19/2015  7:41 PM Full Code [id number]  Arnaldo Natal, MD Inpatient   02/15/2015  9:30 PM 02/18/2015  6:34 PM Full Code [id number]  Arrie Eastern, RN Inpatient   01/29/2015  9:46 AM 02/02/2015  6:20 PM Full Code [id number]  Ihor Austin, MD Inpatient   11/03/2014  8:04 AM 11/05/2014  2:36 PM Full Code [id number]  Arnaldo Natal, MD Inpatient   08/01/2014  3:18 PM 08/04/2014  6:26 PM Full Code [id number]  Katha Hamming, MD ED   06/15/2014  1:36 AM 06/18/2014  8:33 PM Full Code [id number]  Arnaldo Natal, MD ED     Disposition Plan: Need to see blood cultures clear prior to disposition  Antibiotics:  Zosyn  Vancomycin  Time spent: 20 minutes  Alford Highland  [employer]

## 2015-07-07 NOTE — Progress Notes (Signed)
Pharmacy Antibiotic Note  Travis Cantrell CINA is a 55 y.o. male admitted on 07/05/2015 with UTI.  Pharmacy has been consulted for Zosyn and vancomycin dosing.  Plan: 1. Zosyn 3.375 gm IV Q8H EI 2. Vancomycin 1 gm IV x 1 followed in 6 hours (stacked dosing) by vancomycin 750 mg IV Q12H, predicted trough 15 mcg/mL. Pharmacy will continue to follow and adjust as needed to maintain trough 15 to 20 mcg/mL.   Vd 53.9 L, Ke 0.055 hr-1, T1/2 12.6 hr  Weight: 183 lb (83.008 kg)  Temp (24hrs), Avg:98.9 F (37.2 C), Min:97.9 F (36.6 C), Max:99.6 F (37.6 C)   Recent Labs Lab 07/05/15 2222 07/05/15 2223 07/06/15 0205  WBC 28.7*  --  22.8*  CREATININE 1.41*  --  1.26*  LATICACIDVEN  --  1.6 1.8    Estimated Creatinine Clearance: 68.4 mL/min (by C-G formula based on Cr of 1.26).    Allergies  Allergen Reactions  . Baclofen Diarrhea     6/13 [employer] returned Staph spp. mec A(+). Restarting vancomycin per conversation with hospitalist.  DW 83kg Vd 58L kei 0.061 hr-1  T1/2 11 hours. Restarting at 1 gram q 12 hours, no stacked dosing. Level before 4th dose after restart. Goal trough 15-20.   Thank you for allowing pharmacy to be a part of this patient's care.  Kiyah Demartini S, Pharm.D., BCPS Clinical Pharmacist 07/07/2015 1:35 AM

## 2015-07-07 NOTE — Consult Note (Signed)
   [facility] CM Inpatient Consult   07/07/2015  DEMARRIUS OVERALL January 18, 1961 [id number]   Patient screened for potential [facility] Care Management services. Patient is eligible for [facility] Care Management Services. Spoke with inpatient case manager who felt [facility] Care Management services not appropriate at this time. If patient's post hospital needs change please place a [facility] Care Management consult. For questions please contact:   Syanne Looney RN, BSN [facility] Liaison  [phone]) Business Mobile [phone]) Toll free office

## 2015-07-07 NOTE — Plan of Care (Signed)
Problem: Urinary Elimination: Goal: Signs and symptoms of infection will decrease Outcome: Progressing Remains on IVF's. Remains on IV Antibiotics. Labs Improving.

## 2015-07-08 NOTE — Plan of Care (Signed)
Problem: Urinary Elimination: Goal: Signs and symptoms of infection will decrease Outcome: Progressing Remains on IVF's. Remains on IV Antibiotics. Labs Improving.     Problem: Skin Integrity: Goal: Risk for impaired skin integrity will decrease Outcome: Progressing Turn Q 2hrs. Nystatin Powder 4 (four) times daily.

## 2015-07-08 NOTE — Progress Notes (Signed)
[facility] CLINIC INFECTIOUS DISEASE PROGRESS NOTE Date of Admission:  07/05/2015     ID: Travis Cantrell is a 55 y.o. male with recurrent UTI   Active Problems:   UTI (lower urinary tract infection)   Subjective: No fevers,   ROS  Unable to obatin   Medications:  Antibiotics Given (last 72 hours)    Date/Time Action Medication Dose Rate   07/06/15 0240 Given   piperacillin-tazobactam (ZOSYN) IVPB 3.375 g 3.375 g 12.5 mL/hr   07/06/15 0240 Given   vancomycin (VANCOCIN) IVPB 1000 mg/200 mL premix 1,000 mg 200 mL/hr   07/06/15 0912 Given   vancomycin (VANCOCIN) IVPB 750 mg/150 ml premix 750 mg 150 mL/hr   07/06/15 1109 Given   piperacillin-tazobactam (ZOSYN) IVPB 3.375 g 3.375 g 12.5 mL/hr   07/06/15 1733 Given   piperacillin-tazobactam (ZOSYN) IVPB 3.375 g 3.375 g 12.5 mL/hr   07/07/15 0142 Given   piperacillin-tazobactam (ZOSYN) IVPB 3.375 g 3.375 g 12.5 mL/hr   07/07/15 0233 Given   vancomycin (VANCOCIN) IVPB 1000 mg/200 mL premix 1,000 mg 200 mL/hr   07/07/15 1059 Given   piperacillin-tazobactam (ZOSYN) IVPB 3.375 g 3.375 g 12.5 mL/hr   07/07/15 1459 Given   vancomycin (VANCOCIN) IVPB 1000 mg/200 mL premix 1,000 mg 200 mL/hr   07/07/15 1908 Given  [pharmacy delay]   Ampicillin-Sulbactam (UNASYN) 3 g in sodium chloride 0.9 % 100 mL IVPB 3 g 100 mL/hr   07/08/15 0051 Given   Ampicillin-Sulbactam (UNASYN) 3 g in sodium chloride 0.9 % 100 mL IVPB 3 g 100 mL/hr   07/08/15 0618 Given   Ampicillin-Sulbactam (UNASYN) 3 g in sodium chloride 0.9 % 100 mL IVPB 3 g 100 mL/hr   07/08/15 1353 Given   Ampicillin-Sulbactam (UNASYN) 3 g in sodium chloride 0.9 % 100 mL IVPB 3 g 100 mL/hr     . ampicillin-sulbactam (UNASYN) IV  3 g Intravenous Q6H  . atorvastatin  40 mg Oral QHS  . diazepam  5 mg Oral Q12H  . enoxaparin (LOVENOX) injection  40 mg Subcutaneous Q24H  . feeding supplement (ENSURE ENLIVE)  237 mL Oral BID WC  . FLUoxetine  20 mg Oral Daily   And  . FLUoxetine  20 mg Oral  QHS  . gabapentin  900 mg Oral TID  . lisinopril  10 mg Oral Daily  . metoprolol succinate  25 mg Oral Daily  . nystatin  5 mL Oral QID  . nystatin  1 g Topical QID  . pantoprazole  40 mg Oral QAC breakfast    Objective: Vital signs in last 24 hours: Temp:  [98 F (36.7 C)-100 F (37.8 C)] 98 F (36.7 C) (06/14 1451) Pulse Rate:  [56-66] 66 (06/14 1451) Resp:  [17-18] 18 (06/14 1451) BP: (153-162)/(82-85) 157/83 mmHg (06/14 1451) SpO2:  [96 %-100 %] 98 % (06/14 1451) Constitutional: sleepy, chroniclly ill appearing HENT: anicteric Mouth/Throat: Oropharynx is clear and moist. No oropharyngeal exudate.  Cardiovascular: Normal rate, regular rhythm and normal heart sounds. EPulmonary/Chest: Effort normal and breath sounds normal. No respiratory distress. He has no wheezes.  Abdominal: Soft. Bowel sounds are normal. He exhibits no distension. There is no tenderness.  Chronci foley in place, dark urine Neurological:sleepy, contracture R arm Skin: Skin is warm and dry. No rash noted. No erythema.   Lab Results  Recent Labs  07/05/15 2222 07/06/15 0205 07/07/15 0721  WBC 28.7* 22.8* 11.0*  HGB 13.2 12.5* 10.9*  HCT 41.8 39.7* 34.8*  NA 138 140  --  K 3.7 3.7  --   CL 103 108  --   CO2 23 21*  --   BUN 30* 26*  --   CREATININE 1.41* 1.26*  --     Microbiology: Results for orders placed or performed during the hospital encounter of 07/05/15  Blood Culture (routine x 2)     Status: Abnormal (Preliminary result)   Collection Time: 07/05/15 10:22 PM  Result Value Ref Range Status   Specimen Description BLOOD RIGHT HAND  Final   Special Requests BOTTLES DRAWN AEROBIC AND ANAEROBIC 7CCAERO,5CCANA  Final   Culture  Setup Time   Final    GRAM POSITIVE COCCI AEROBIC BOTTLE ONLY CRITICAL RESULT CALLED TO, READ BACK BY AND VERIFIED WITH: MATT MCBANE @ 0120 ON 07/07/2015 BY CAF CONFIRM BY SR[facility] Organism ID to follow    Culture STAPHYLOCOCCUS SPECIES (COAGULASE NEGATIVE) (A)   Final   Report Status PENDING  Incomplete  Urine culture     Status: Abnormal   Collection Time: 07/05/15 10:22 PM  Result Value Ref Range Status   Specimen Description URINE, CLEAN CATCH  Final   Special Requests NONE  Final   Culture MULTIPLE SPECIES PRESENT, SUGGEST RECOLLECTION (A)  Final   Report Status 07/07/2015 FINAL  Final  Blood Culture ID Panel (Reflexed)     Status: Abnormal   Collection Time: 07/05/15 10:22 PM  Result Value Ref Range Status   Enterococcus species NOT DETECTED NOT DETECTED Final   Vancomycin resistance NOT DETECTED NOT DETECTED Final   Listeria monocytogenes NOT DETECTED NOT DETECTED Final   Staphylococcus species DETECTED (A) NOT DETECTED Final    Comment: CRITICAL RESULT CALLED TO, READ BACK BY AND VERIFIED WITH: MATT MCBANE @ 0120 ON 07/07/2015 BY CAF    Staphylococcus aureus NOT DETECTED NOT DETECTED Final   Methicillin resistance DETECTED (A) NOT DETECTED Final    Comment: CRITICAL RESULT CALLED TO, READ BACK BY AND VERIFIED WITH: MATT MCBANE @ 0120 ON 07/07/2015 BY CAF    Streptococcus species NOT DETECTED NOT DETECTED Final   Streptococcus agalactiae NOT DETECTED NOT DETECTED Final   Streptococcus pneumoniae NOT DETECTED NOT DETECTED Final   Streptococcus pyogenes NOT DETECTED NOT DETECTED Final   Acinetobacter baumannii NOT DETECTED NOT DETECTED Final   Enterobacteriaceae species NOT DETECTED NOT DETECTED Final   Enterobacter cloacae complex NOT DETECTED NOT DETECTED Final   Escherichia coli NOT DETECTED NOT DETECTED Final   Klebsiella oxytoca NOT DETECTED NOT DETECTED Final   Klebsiella pneumoniae NOT DETECTED NOT DETECTED Final   Proteus species NOT DETECTED NOT DETECTED Final   Serratia marcescens NOT DETECTED NOT DETECTED Final   Carbapenem resistance NOT DETECTED NOT DETECTED Final   Haemophilus influenzae NOT DETECTED NOT DETECTED Final   Neisseria meningitidis NOT DETECTED NOT DETECTED Final   Pseudomonas aeruginosa NOT DETECTED NOT  DETECTED Final   Candida albicans NOT DETECTED NOT DETECTED Final   Candida glabrata NOT DETECTED NOT DETECTED Final   Candida krusei NOT DETECTED NOT DETECTED Final   Candida parapsilosis NOT DETECTED NOT DETECTED Final   Candida tropicalis NOT DETECTED NOT DETECTED Final  Blood Culture (routine x 2)     Status: None (Preliminary result)   Collection Time: 07/05/15 10:36 PM  Result Value Ref Range Status   Specimen Description BLOOD LEFT ASSIST CONTROL  Final   Special Requests   Final    BOTTLES DRAWN AEROBIC AND ANAEROBIC 14CCAERO,16CCANA   Culture NO GROWTH 3 DAYS  Final   Report Status PENDING  Incomplete  MRSA PCR Screening     Status: None   Collection Time: 07/06/15  2:45 AM  Result Value Ref Range Status   MRSA by PCR NEGATIVE NEGATIVE Final    Comment:        The GeneXpert MRSA Assay (FDA approved for NASAL specimens only), is one component of a comprehensive MRSA colonization surveillance program. It is not intended to diagnose MRSA infection nor to guide or monitor treatment for MRSA infections.   CULTURE, BLOOD (ROUTINE X 2) w Reflex to ID Panel     Status: None (Preliminary result)   Collection Time: 07/07/15  2:29 PM  Result Value Ref Range Status   Specimen Description BLOOD LEFT HAND  Final   Special Requests   Final    BOTTLES DRAWN AEROBIC AND ANAEROBIC  AERO 10CC ANA 9CC   Culture NO GROWTH < 24 HOURS  Final   Report Status PENDING  Incomplete  CULTURE, BLOOD (ROUTINE X 2) w Reflex to ID Panel     Status: None (Preliminary result)   Collection Time: 07/07/15  2:38 PM  Result Value Ref Range Status   Specimen Description BLOOD LEFT WRIST  Final   Special Requests BOTTLES DRAWN AEROBIC AND ANAEROBIC  1CC  Final   Culture NO GROWTH < 24 HOURS  Final   Report Status PENDING  Incomplete    Studies/Results: No results found.  Assessment/Plan: Travis Cantrell is a 55 y.o. male with recurrent UTI, in setting of chronic foley cath from [place]. Culture now  mixed. His staph species on bcx is most likely contaminant as is not staph aureus Most recent UTI 04/17/15 with proteus R to amp, keflex, cipro, bactrim, macrobid Recommendations Cont unasyn and if stable on this can dc on augmentin with a 10 day course Foley has been changed  Thank you very much for the consult. Will follow with you.  Deondra Wigger P   07/08/2015, 3:19 PM

## 2015-07-08 NOTE — Plan of Care (Signed)
IVF's Discontinued.

## 2015-07-08 NOTE — Progress Notes (Signed)
Patient ID: Travis Cantrell, male   DOB: 01-30-1960, 55 y.o.   MRN: [id number] [facility] PROGRESS NOTE  VALIN MASSIE WUJ:[id number] DOB: [birth year] DOA: 07/05/2015 PCP: Kerby Nora, MD  HPI/Subjective: Patient feeling a little bit worse than yesterday. Feeling a little more lethargic. Had a low-grade temperature of 100 last night. Unable to specify what else going on.  Objective: Filed Vitals:   07/08/15 0851 07/08/15 1451  BP: 153/84 157/83  Pulse: 56 66  Temp: 98.4 F (36.9 C) 98 F (36.7 C)  Resp: 17 18    Filed Weights   07/05/15 2220  Weight: 83.008 kg (183 lb)    ROS: Review of Systems  Constitutional: Negative for fever and chills.  Eyes: Negative for blurred vision.  Respiratory: Negative for cough and shortness of breath.   Cardiovascular: Negative for chest pain.  Gastrointestinal: Negative for nausea, vomiting, abdominal pain, diarrhea and constipation.  Genitourinary: Negative for dysuria.  Musculoskeletal: Negative for joint pain.  Neurological: Negative for dizziness and headaches.   Exam: Physical Exam  Constitutional: He is oriented to person, place, and time.  HENT:  Nose: No mucosal edema.  Mouth/Throat: No oropharyngeal exudate or posterior oropharyngeal edema.  Eyes: Conjunctivae, EOM and lids are normal. Pupils are equal, round, and reactive to light.  Neck: No JVD present. Carotid bruit is not present. No edema present. No thyroid mass and no thyromegaly present.  Cardiovascular: S1 normal and S2 normal.  Exam reveals no gallop.   No murmur heard. Pulses:      Dorsalis pedis pulses are 2+ on the right side, and 2+ on the left side.  Respiratory: No respiratory distress. He has no wheezes. He has no rhonchi. He has no rales.  GI: Soft. Bowel sounds are normal. There is no tenderness.  Musculoskeletal:       Right ankle: He exhibits swelling.       Left ankle: He exhibits swelling.  Lymphadenopathy:    He has no cervical adenopathy.   Neurological: He is alert and oriented to person, place, and time.  Contractions on hands and feet  Skin: Skin is warm. Nails show no clubbing.  Chronic lower extremity discoloration and some scaling  Psychiatric: He has a normal mood and affect.      Data Reviewed: Basic Metabolic Panel:  Recent Labs Lab 07/05/15 2222 07/06/15 0205  NA 138 140  K 3.7 3.7  CL 103 108  CO2 23 21*  GLUCOSE 148* 127*  BUN 30* 26*  CREATININE 1.41* 1.26*  CALCIUM 9.3 8.4*   Liver Function Tests:  Recent Labs Lab 07/05/15 2222 07/06/15 0205  AST 14* 13*  ALT 11* 9*  ALKPHOS 88 79  BILITOT 1.8* 1.8*  PROT 7.8 7.0  ALBUMIN 3.6 3.2*   CBC:  Recent Labs Lab 07/05/15 2222 07/06/15 0205 07/07/15 0721  WBC 28.7* 22.8* 11.0*  NEUTROABS 23.8*  --   --   HGB 13.2 12.5* 10.9*  HCT 41.8 39.7* 34.8*  MCV 75.9* 77.6* 77.9*  PLT 397 319 283     Recent Results (from the past 240 hour(s))  Blood Culture (routine x 2)     Status: Abnormal (Preliminary result)   Collection Time: 07/05/15 10:22 PM  Result Value Ref Range Status   Specimen Description BLOOD RIGHT HAND  Final   Special Requests BOTTLES DRAWN AEROBIC AND ANAEROBIC 7CCAERO,5CCANA  Final   Culture  Setup Time   Final    GRAM POSITIVE COCCI AEROBIC BOTTLE ONLY CRITICAL RESULT  CALLED TO, READ BACK BY AND VERIFIED WITH: MATT MCBANE @ 0120 ON 07/07/2015 BY [facility] CONFIRM BY [facility] Organism ID to follow    Culture STAPHYLOCOCCUS SPECIES (COAGULASE NEGATIVE) (A)  Final   Report Status PENDING  Incomplete  Urine culture     Status: Abnormal   Collection Time: 07/05/15 10:22 PM  Result Value Ref Range Status   Specimen Description URINE, CLEAN CATCH  Final   Special Requests NONE  Final   Culture MULTIPLE SPECIES PRESENT, SUGGEST RECOLLECTION (A)  Final   Report Status 07/07/2015 FINAL  Final  Blood Culture ID Panel (Reflexed)     Status: Abnormal   Collection Time: 07/05/15 10:22 PM  Result Value Ref Range Status   Enterococcus  species NOT DETECTED NOT DETECTED Final   Vancomycin resistance NOT DETECTED NOT DETECTED Final   Listeria monocytogenes NOT DETECTED NOT DETECTED Final   Staphylococcus species DETECTED (A) NOT DETECTED Final    Comment: CRITICAL RESULT CALLED TO, READ BACK BY AND VERIFIED WITH: MATT MCBANE @ 0120 ON 07/07/2015 BY [facility]    Staphylococcus aureus NOT DETECTED NOT DETECTED Final   Methicillin resistance DETECTED (A) NOT DETECTED Final    Comment: CRITICAL RESULT CALLED TO, READ BACK BY AND VERIFIED WITH: MATT MCBANE @ 0120 ON 07/07/2015 BY [facility]    Streptococcus species NOT DETECTED NOT DETECTED Final   Streptococcus agalactiae NOT DETECTED NOT DETECTED Final   Streptococcus pneumoniae NOT DETECTED NOT DETECTED Final   Streptococcus pyogenes NOT DETECTED NOT DETECTED Final   Acinetobacter baumannii NOT DETECTED NOT DETECTED Final   Enterobacteriaceae species NOT DETECTED NOT DETECTED Final   Enterobacter cloacae complex NOT DETECTED NOT DETECTED Final   Escherichia coli NOT DETECTED NOT DETECTED Final   Klebsiella oxytoca NOT DETECTED NOT DETECTED Final   Klebsiella pneumoniae NOT DETECTED NOT DETECTED Final   Proteus species NOT DETECTED NOT DETECTED Final   Serratia marcescens NOT DETECTED NOT DETECTED Final   Carbapenem resistance NOT DETECTED NOT DETECTED Final   Haemophilus influenzae NOT DETECTED NOT DETECTED Final   Neisseria meningitidis NOT DETECTED NOT DETECTED Final   Pseudomonas aeruginosa NOT DETECTED NOT DETECTED Final   Candida albicans NOT DETECTED NOT DETECTED Final   Candida glabrata NOT DETECTED NOT DETECTED Final   Candida krusei NOT DETECTED NOT DETECTED Final   Candida parapsilosis NOT DETECTED NOT DETECTED Final   Candida tropicalis NOT DETECTED NOT DETECTED Final  Blood Culture (routine x 2)     Status: None (Preliminary result)   Collection Time: 07/05/15 10:36 PM  Result Value Ref Range Status   Specimen Description BLOOD LEFT ASSIST CONTROL  Final   Special  Requests   Final    BOTTLES DRAWN AEROBIC AND ANAEROBIC 14CCAERO,16CCANA   Culture NO GROWTH 3 DAYS  Final   Report Status PENDING  Incomplete  MRSA PCR Screening     Status: None   Collection Time: 07/06/15  2:45 AM  Result Value Ref Range Status   MRSA by PCR NEGATIVE NEGATIVE Final    Comment:        The GeneXpert MRSA Assay (FDA approved for NASAL specimens only), is one component of a comprehensive MRSA colonization surveillance program. It is not intended to diagnose MRSA infection nor to guide or monitor treatment for MRSA infections.   CULTURE, BLOOD (ROUTINE X 2) w Reflex to ID Panel     Status: None (Preliminary result)   Collection Time: 07/07/15  2:29 PM  Result Value Ref Range Status  Specimen Description BLOOD LEFT HAND  Final   Special Requests   Final    BOTTLES DRAWN AEROBIC AND ANAEROBIC  AERO 10CC ANA 9CC   Culture NO GROWTH < 24 HOURS  Final   Report Status PENDING  Incomplete  CULTURE, BLOOD (ROUTINE X 2) w Reflex to ID Panel     Status: None (Preliminary result)   Collection Time: 07/07/15  2:38 PM  Result Value Ref Range Status   Specimen Description BLOOD LEFT WRIST  Final   Special Requests BOTTLES DRAWN AEROBIC AND ANAEROBIC  1CC  Final   Culture NO GROWTH < 24 HOURS  Final   Report Status PENDING  Incomplete      Scheduled Meds: . ampicillin-sulbactam (UNASYN) IV  3 g Intravenous Q6H  . atorvastatin  40 mg Oral QHS  . diazepam  5 mg Oral Q12H  . enoxaparin (LOVENOX) injection  40 mg Subcutaneous Q24H  . feeding supplement (ENSURE ENLIVE)  237 mL Oral BID WC  . FLUoxetine  20 mg Oral Daily   And  . FLUoxetine  20 mg Oral QHS  . gabapentin  900 mg Oral TID  . lisinopril  10 mg Oral Daily  . metoprolol succinate  25 mg Oral Daily  . nystatin  5 mL Oral QID  . nystatin  1 g Topical QID  . pantoprazole  40 mg Oral QAC breakfast    Assessment/Plan:  1. Clinical sepsis Present on admission. Likely urine source. Urine culture unfortunately  contamination. Antibiotics switched to Unasyn. Blood cultures staph species likely contamination. Repeat blood cultures so far negative. 2. History of multiple sclerosis 3. Essential hypertension continue lisinopril metoprolol 4. Acute kidney injury. Improved check creatinine tomorrow 5. Hyperlipidemia unspecified on atorvastatin 6. Anxiety on fluoxetine and Valium 7. GERD on Protonix  Code Status:     Code Status Orders        Start     Ordered   07/06/15 0015  Full code   Continuous     07/06/15 0018    Code Status History    Date Active Date Inactive Code Status Order ID Comments User Context   03/16/2015  7:45 PM 03/19/2015  7:41 PM Full Code [id number]  Arnaldo Natal, MD Inpatient   02/15/2015  9:30 PM 02/18/2015  6:34 PM Full Code [id number]  Arrie Eastern, RN Inpatient   01/29/2015  9:46 AM 02/02/2015  6:20 PM Full Code [id number]  Ihor Austin, MD Inpatient   11/03/2014  8:04 AM 11/05/2014  2:36 PM Full Code [id number]  Arnaldo Natal, MD Inpatient   08/01/2014  3:18 PM 08/04/2014  6:26 PM Full Code [id number]  Katha Hamming, MD ED   06/15/2014  1:36 AM 06/18/2014  8:33 PM Full Code [id number]  Arnaldo Natal, MD ED     Disposition Plan: Potentially out of hospital tomorrow  Antibiotics:  Unasyn  Time spent: 20 minutes  Alford Highland  [facility]

## 2015-07-09 LAB — BASIC METABOLIC PANEL WITH GFR
Anion gap: 7 (ref 5–15)
BUN: 14 mg/dL (ref 6–20)
CO2: 29 mmol/L (ref 22–32)
Calcium: 8.5 mg/dL — ABNORMAL LOW (ref 8.9–10.3)
Chloride: 106 mmol/L (ref 101–111)
Creatinine, Ser: 0.94 mg/dL (ref 0.61–1.24)
GFR calc Af Amer: >60 mL/min
GFR calc non Af Amer: >60 mL/min
Glucose, Bld: 96 mg/dL (ref 65–99)
Potassium: 3.1 mmol/L — ABNORMAL LOW (ref 3.5–5.1)
Sodium: 142 mmol/L (ref 135–145)

## 2015-07-09 LAB — CBC
HCT: 34.2 % — ABNORMAL LOW (ref 40.0–52.0)
Hemoglobin: 11 g/dL — ABNORMAL LOW (ref 13.0–18.0)
MCH: 24.6 pg — ABNORMAL LOW (ref 26.0–34.0)
MCHC: 32.2 g/dL (ref 32.0–36.0)
MCV: 76.2 fL — ABNORMAL LOW (ref 80.0–100.0)
Platelets: 291 K/uL (ref 150–440)
RBC: 4.49 MIL/uL (ref 4.40–5.90)
RDW: 15 % — ABNORMAL HIGH (ref 11.5–14.5)
WBC: 9.4 K/uL (ref 3.8–10.6)

## 2015-07-09 LAB — CULTURE, BLOOD (ROUTINE X 2)

## 2015-07-09 MED ORDER — POTASSIUM CHLORIDE CRYS ER 20 MEQ PO TBCR
40.0000 meq | EXTENDED_RELEASE_TABLET | Freq: Once | ORAL | Status: AC
  Administered 2015-07-09: 40 meq via ORAL
  Filled 2015-07-09: qty 2

## 2015-07-09 MED ORDER — NYSTATIN 100000 UNIT/ML MT SUSP: 5.0000 mL | Freq: Four times a day (QID) | OROMUCOSAL | Status: DC

## 2015-07-09 MED ORDER — AMOXICILLIN-POT CLAVULANATE 875-125 MG PO TABS: 1.0000 | ORAL_TABLET | Freq: Two times a day (BID) | ORAL | Status: DC

## 2015-07-09 MED ORDER — AMOXICILLIN-POT CLAVULANATE 875-125 MG PO TABS
1.0000 | ORAL_TABLET | Freq: Two times a day (BID) | ORAL | Status: DC
  Administered 2015-07-09: 09:00:00 1 via ORAL
  Filled 2015-07-09: qty 1

## 2015-07-09 NOTE — Discharge Instructions (Addendum)
Urinary Tract Infection A urinary tract infection (UTI) can occur any place along the urinary tract. The tract includes the kidneys, ureters, bladder, and urethra. A type of germ called bacteria often causes a UTI. UTIs are often helped with antibiotic medicine.  HOME CARE   If given, take antibiotics as told by your doctor. Finish them even if you start to feel better.  Drink enough fluids to keep your pee (urine) clear or pale yellow.  Avoid tea, drinks with caffeine, and bubbly (carbonated) drinks.  Pee often. Avoid holding your pee in for a long time.  Pee before and after having sex (intercourse).  Wipe from front to back after you poop (bowel movement) if you are a woman. Use each tissue only once. GET HELP RIGHT AWAY IF:   You have back pain.  You have lower belly (abdominal) pain.  You have chills.  You feel sick to your stomach (nauseous).  You throw up (vomit).  Your burning or discomfort with peeing does not go away.  You have a fever.  Your symptoms are not better in 3 days. MAKE SURE YOU:   Understand these instructions.  Will watch your condition.  Will get help right away if you are not doing well or get worse.   This information is not intended to replace advice given to you by your health care provider. Make sure you discuss any questions you have with your health care provider.   Document Released: 06/29/2007 Document Revised: 01/31/2014 Document Reviewed: 08/11/2011 Elsevier Interactive Patient Education 2016 [employer]  Sepsis, Adult Sepsis is a serious infection of your blood or tissues that affects your whole body. The infection that causes sepsis may be bacterial, viral, fungal, or parasitic. Sepsis may be life threatening. Sepsis can cause your blood pressure to drop. This may result in shock. Shock causes your central nervous system and your organs to stop working correctly.  RISK FACTORS Sepsis can happen in anyone, but it is more likely  to happen in people who have weakened immune systems. SIGNS AND SYMPTOMS  Symptoms of sepsis can include:  Fever or low body temperature (hypothermia).  Rapid breathing (hyperventilation).  Chills.  Rapid heartbeat (tachycardia).  Confusion or light-headedness.  Trouble breathing.  Urinating much less than usual.  Cool, clammy skin or red, flushed skin.  Other problems with the heart, kidneys, or brain. DIAGNOSIS  Your health care provider will likely do tests to look for an infection, to see if the infection has spread to your blood, and to see how serious your condition is. Tests can include:  Blood tests, including cultures of your blood.  Cultures of other fluids from your body, such as:  Urine.  Pus from wounds.  Mucus coughed up from your lungs.  Urine tests other than cultures.  X-ray exams or other imaging tests. TREATMENT  Treatment will begin with elimination of the source of infection. If your sepsis is likely caused by a bacterial or fungal infection, you will be given antibiotic or antifungal medicines. You may also receive:  Oxygen.  Fluids through an IV tube.  Medicines to increase your blood pressure.  A machine to clean your blood (dialysis) if your kidneys fail.  A machine to help you breathe if your lungs fail. SEEK IMMEDIATE MEDICAL CARE IF: You get an infection or develop any of the signs and symptoms of sepsis after surgery or a hospitalization.   This information is not intended to replace advice given to you by your health  care provider. Make sure you discuss any questions you have with your health care provider.   Document Released: 10/09/2002 Document Revised: 05/27/2014 Document Reviewed: 09/17/2012 Elsevier Interactive Patient Education [employer]

## 2015-07-09 NOTE — Discharge Summary (Signed)
[facility] - [place] at [facility]   PATIENT NAME: Travis Cantrell    MR#:  [id number]  DATE OF BIRTH:  1960-12-23  DATE OF ADMISSION:  07/05/2015 ADMITTING PHYSICIAN: Ihor Austin, MD  DATE OF DISCHARGE: 07/09/2015 12:12 PM  PRIMARY CARE PHYSICIAN: Kerby Nora, MD    ADMISSION DIAGNOSIS:  Acute urinary tract infection [N39.0] Foley catheter problem, initial encounter (HCC) [T83.9XXA] Sepsis, due to unspecified organism (HCC) [A41.9]  DISCHARGE DIAGNOSIS:  Active Problems:   UTI (lower urinary tract infection)   SECONDARY DIAGNOSIS:   Past Medical History  Diagnosis Date  . Allergy   . GERD (gastroesophageal reflux disease)   . MS (multiple sclerosis) (HCC)   . Hypertension   . Decubitus ulcer     HOSPITAL COURSE:   1. Clinical sepsis, catheter related urinary tract infection, leukocytosis, fever and tachycardia. Initial blood cultures came back positive for staph species which is likely contaminant. Unfortunately urine culture came back as multiple organisms. Foley catheter was changed. Patient was given aggressive antibiotics initially and switched over to Unasyn and then switched over to Augmentin upon discharge home. Patient was seen in consultation by Dr. Sampson Goon infectious disease to help out with antibiotics. Patient's white count came down into the normal range and patient was a afebrile upon discharge. Home health nurse to change Foley every 3 weeks. 2. History of multiple sclerosis and bedbound. A friend takes care of him. We'll also set up home health. 3. Essential hypertension on lisinopril metoprolol 4. Acute kidney injury. Patient was given IV fluid hydration and creatinine normalized upon discharge. 5. Hypokalemia this was replaced upon discharge 6. Hyperlipidemia unspecified on atorvastatin 7. Anxiety on fluoxetine and Valium 8. gastroesophageal reflux disease on Protonix 9. Thrush -nystatin swish and swallow  DISCHARGE CONDITIONS:    Satisfactory  CONSULTS OBTAINED:  Treatment Team:  Mick Sell, MD  DRUG ALLERGIES:   Allergies  Allergen Reactions  . Baclofen Diarrhea    DISCHARGE MEDICATIONS:   Discharge Medication List as of 07/09/2015  9:08 AM    START taking these medications   Details  amoxicillin-clavulanate (AUGMENTIN) 875-125 MG tablet Take 1 tablet by mouth every 12 (twelve) hours., Starting 07/09/2015, Until Discontinued, Normal    nystatin (MYCOSTATIN) 100000 UNIT/ML suspension Take 5 mLs (500,000 Units total) by mouth 4 (four) times daily., Starting 07/09/2015, Until Discontinued, Normal      CONTINUE these medications which have NOT CHANGED   Details  albuterol (PROVENTIL) (2.5 MG/3ML) 0.083% nebulizer solution Take 3 mLs (2.5 mg total) by nebulization every 4 (four) hours as needed for wheezing or shortness of breath., Starting 02/18/2015, Until Discontinued, Normal    Aspirin-Acetaminophen-Caffeine (EXCEDRIN MIGRAINE PO) Take by mouth. Pt's sister stated pt took Excedrin for migraines in past week however she is unsure of dose, Until Discontinued, Historical Med    atorvastatin (LIPITOR) 40 MG tablet TAKE ONE TABLET BY MOUTH ONCE DAILY, Normal    butalbital-acetaminophen-caffeine (FIORICET) 50-325-40 MG tablet Take 1 tablet by mouth every 6 (six) hours as needed for headache., Starting 04/14/2015, Until Discontinued, Print    diazepam (VALIUM) 5 MG tablet TAKE ONE TABLET BY MOUTH EVERY 12 HOURS, Phone In    feeding supplement, ENSURE ENLIVE, (ENSURE ENLIVE) LIQD Take 237 mLs by mouth 2 (two) times daily with a meal., Starting 08/04/2014, Until Discontinued, Normal    FLUoxetine (PROZAC) 20 MG capsule Take 20 mg by mouth 2 (two) times daily. , Until Discontinued, Historical Med    gabapentin (NEURONTIN) 300 MG capsule  Take 3 capsules (900 mg total) by mouth 3 (three) times daily., Starting 06/09/2015, Until Discontinued, Normal    lisinopril (PRINIVIL,ZESTRIL) 10 MG tablet Take 10 mg by  mouth daily. , Until Discontinued, Historical Med    metoprolol succinate (TOPROL-XL) 25 MG 24 hr tablet Take 1 tablet by mouth daily., Starting 06/01/2015, Until Discontinued, Historical Med    nystatin (MYCOSTATIN) powder Apply 1 g topically daily. , Until Discontinued, Historical Med    omeprazole (PRILOSEC) 20 MG capsule Take 20 mg by mouth 2 (two) times daily., Until Discontinued, Historical Med      STOP taking these medications     ibuprofen (ADVIL,MOTRIN) 800 MG tablet          DISCHARGE INSTRUCTIONS:   Follow-up PMD one week  If you experience worsening of your admission symptoms, develop shortness of breath, life threatening emergency, suicidal or homicidal thoughts you must seek medical attention immediately by calling 911 or calling your MD immediately  if symptoms less severe.  You Must read complete instructions/literature along with all the possible adverse reactions/side effects for all the Medicines you take and that have been prescribed to you. Take any new Medicines after you have completely understood and accept all the possible adverse reactions/side effects.   Please note  You were cared for by a hospitalist during your hospital stay. If you have any questions about your discharge medications or the care you received while you were in the hospital after you are discharged, you can call the unit and asked to speak with the hospitalist on call if the hospitalist that took care of you is not available. Once you are discharged, your primary care physician will handle any further medical issues. Please note that NO REFILLS for any discharge medications will be authorized once you are discharged, as it is imperative that you return to your primary care physician (or establish a relationship with a primary care physician if you do not have one) for your aftercare needs so that they can reassess your need for medications and monitor your lab values.    Today   CHIEF  COMPLAINT:   Chief Complaint  Patient presents with  . Fever  . Weakness    HISTORY OF PRESENT ILLNESS:  Travis Cantrell  is a 55 y.o. male presented with fever and weakness and found to be septic   VITAL SIGNS:  Blood pressure 163/90, pulse 61, temperature 98.1 F (36.7 C), temperature source Oral, resp. rate 19, height 5\' 10"  (1.778 m), weight 83.008 kg (183 lb), SpO2 100 %.    PHYSICAL EXAMINATION:  GENERAL:  55 y.o.-year-old patient lying in the bed with no acute distress.  EYES: Pupils equal, round, reactive to light and accommodation. No scleral icterus. Extraocular muscles intact.  HEENT: Head atraumatic, normocephalic. Oropharynx and nasopharynx clear.  NECK:  Supple, no jugular venous distention. No thyroid enlargement, no tenderness.  LUNGS: Normal breath sounds bilaterally, no wheezing, rales,rhonchi or crepitation. No use of accessory muscles of respiration.  CARDIOVASCULAR: S1, S2 normal. No murmurs, rubs, or gallops.  ABDOMEN: Soft, non-tender, non-distended. Bowel sounds present. No organomegaly or mass.  EXTREMITIES: Trace edema, no cyanosis, or clubbing.  NEUROLOGIC: Cranial nerves II through XII are intact. Muscle strength 5/5 in all extremities. Sensation intact. Gait not checked.  PSYCHIATRIC: The patient is alert and oriented x 3.  SKIN: Chronic lower extremity skin discoloration and scabs seen  DATA REVIEW:   CBC  Recent Labs Lab 07/09/15 0449  WBC 9.4  HGB  11.0*  HCT 34.2*  PLT 291    Chemistries   Recent Labs Lab 07/06/15 0205 07/09/15 0449  NA 140 142  K 3.7 3.1*  CL 108 106  CO2 21* 29  GLUCOSE 127* 96  BUN 26* 14  CREATININE 1.26* 0.94  CALCIUM 8.4* 8.5*  AST 13*  --   ALT 9*  --   ALKPHOS 79  --   BILITOT 1.8*  --     Cardiac Enzymes No results for input(s): TROPONINI in the last 168 hours.  Microbiology Results  Results for orders placed or performed during the hospital encounter of 07/05/15  Blood Culture (routine x 2)      Status: Abnormal   Collection Time: 07/05/15 10:22 PM  Result Value Ref Range Status   Specimen Description BLOOD RIGHT HAND  Final   Special Requests BOTTLES DRAWN AEROBIC AND ANAEROBIC 7CCAERO,5CCANA  Final   Culture  Setup Time   Final    GRAM POSITIVE COCCI AEROBIC BOTTLE ONLY CRITICAL RESULT CALLED TO, READ BACK BY AND VERIFIED WITH: MATT MCBANE @ 0120 ON 07/07/2015 BY CAF CONFIRM BY [facility] Organism ID to follow    Culture (A)  Final    STAPHYLOCOCCUS SPECIES (COAGULASE NEGATIVE) THE SIGNIFICANCE OF ISOLATING THIS ORGANISM FROM A SINGLE SET OF BLOOD CULTURES WHEN MULTIPLE SETS ARE DRAWN IS UNCERTAIN. PLEASE NOTIFY THE MICROBIOLOGY DEPARTMENT WITHIN ONE WEEK IF SPECIATION AND SENSITIVITIES ARE REQUIRED. Performed at [facility]    Report Status 07/09/2015 FINAL  Final  Urine culture     Status: Abnormal   Collection Time: 07/05/15 10:22 PM  Result Value Ref Range Status   Specimen Description URINE, CLEAN CATCH  Final   Special Requests NONE  Final   Culture MULTIPLE SPECIES PRESENT, SUGGEST RECOLLECTION (A)  Final   Report Status 07/07/2015 FINAL  Final  Blood Culture ID Panel (Reflexed)     Status: Abnormal   Collection Time: 07/05/15 10:22 PM  Result Value Ref Range Status   Enterococcus species NOT DETECTED NOT DETECTED Final   Vancomycin resistance NOT DETECTED NOT DETECTED Final   Listeria monocytogenes NOT DETECTED NOT DETECTED Final   Staphylococcus species DETECTED (A) NOT DETECTED Final    Comment: CRITICAL RESULT CALLED TO, READ BACK BY AND VERIFIED WITH: MATT MCBANE @ 0120 ON 07/07/2015 BY CAF    Staphylococcus aureus NOT DETECTED NOT DETECTED Final   Methicillin resistance DETECTED (A) NOT DETECTED Final    Comment: CRITICAL RESULT CALLED TO, READ BACK BY AND VERIFIED WITH: MATT MCBANE @ 0120 ON 07/07/2015 BY CAF    Streptococcus species NOT DETECTED NOT DETECTED Final   Streptococcus agalactiae NOT DETECTED NOT DETECTED Final   Streptococcus pneumoniae  NOT DETECTED NOT DETECTED Final   Streptococcus pyogenes NOT DETECTED NOT DETECTED Final   Acinetobacter baumannii NOT DETECTED NOT DETECTED Final   Enterobacteriaceae species NOT DETECTED NOT DETECTED Final   Enterobacter cloacae complex NOT DETECTED NOT DETECTED Final   Escherichia coli NOT DETECTED NOT DETECTED Final   Klebsiella oxytoca NOT DETECTED NOT DETECTED Final   Klebsiella pneumoniae NOT DETECTED NOT DETECTED Final   Proteus species NOT DETECTED NOT DETECTED Final   Serratia marcescens NOT DETECTED NOT DETECTED Final   Carbapenem resistance NOT DETECTED NOT DETECTED Final   Haemophilus influenzae NOT DETECTED NOT DETECTED Final   Neisseria meningitidis NOT DETECTED NOT DETECTED Final   Pseudomonas aeruginosa NOT DETECTED NOT DETECTED Final   Candida albicans NOT DETECTED NOT DETECTED Final   Candida glabrata  NOT DETECTED NOT DETECTED Final   Candida krusei NOT DETECTED NOT DETECTED Final   Candida parapsilosis NOT DETECTED NOT DETECTED Final   Candida tropicalis NOT DETECTED NOT DETECTED Final  Blood Culture (routine x 2)     Status: None (Preliminary result)   Collection Time: 07/05/15 10:36 PM  Result Value Ref Range Status   Specimen Description BLOOD LEFT ASSIST CONTROL  Final   Special Requests   Final    BOTTLES DRAWN AEROBIC AND ANAEROBIC 14CCAERO,16CCANA   Culture NO GROWTH 4 DAYS  Final   Report Status PENDING  Incomplete  MRSA PCR Screening     Status: None   Collection Time: 07/06/15  2:45 AM  Result Value Ref Range Status   MRSA by PCR NEGATIVE NEGATIVE Final    Comment:        The GeneXpert MRSA Assay (FDA approved for NASAL specimens only), is one component of a comprehensive MRSA colonization surveillance program. It is not intended to diagnose MRSA infection nor to guide or monitor treatment for MRSA infections.   CULTURE, BLOOD (ROUTINE X 2) w Reflex to ID Panel     Status: None (Preliminary result)   Collection Time: 07/07/15  2:29 PM  Result  Value Ref Range Status   Specimen Description BLOOD LEFT HAND  Final   Special Requests   Final    BOTTLES DRAWN AEROBIC AND ANAEROBIC  AERO 10CC ANA 9CC   Culture NO GROWTH 2 DAYS  Final   Report Status PENDING  Incomplete  CULTURE, BLOOD (ROUTINE X 2) w Reflex to ID Panel     Status: None (Preliminary result)   Collection Time: 07/07/15  2:38 PM  Result Value Ref Range Status   Specimen Description BLOOD LEFT WRIST  Final   Special Requests BOTTLES DRAWN AEROBIC AND ANAEROBIC  1CC  Final   Culture NO GROWTH 2 DAYS  Final   Report Status PENDING  Incomplete    Management plans discussed with the patient, family and they are in agreement.  CODE STATUS:  Code Status History    Date Active Date Inactive Code Status Order ID Comments User Context   07/06/2015 12:18 AM 07/09/2015  3:21 PM Full Code [id number]  Ihor Austin, MD ED   03/16/2015  7:45 PM 03/19/2015  7:41 PM Full Code [id number]  Arnaldo Natal, MD Inpatient   02/15/2015  9:30 PM 02/18/2015  6:34 PM Full Code [id number]  Arrie Eastern, RN Inpatient   01/29/2015  9:46 AM 02/02/2015  6:20 PM Full Code [id number]  Ihor Austin, MD Inpatient   11/03/2014  8:04 AM 11/05/2014  2:36 PM Full Code [id number]  Arnaldo Natal, MD Inpatient   08/01/2014  3:18 PM 08/04/2014  6:26 PM Full Code [id number]  Katha Hamming, MD ED   06/15/2014  1:36 AM 06/18/2014  8:33 PM Full Code [id number]  Arnaldo Natal, MD ED      TOTAL TIME TAKING CARE OF THIS PATIENT: 35 minutes.    Alford Highland M.D on 07/09/2015 at 4:44 PM  Between 7am to 6pm - Pager - [phone]  After 6pm go to [email or web address] - password [email or web address]  [employer] [facility] Office  [phone]  CC: Primary care physician; Kerby Nora, MD

## 2015-07-09 NOTE — Care Management Important Message (Signed)
Important Message  Patient Details  Name: Travis Cantrell MRN: [id number] Date of Birth: 03/25/1960   Medicare Important Message Given:  Yes    Olegario Messier A Anderson Coppock 07/09/2015, 9:25 AM

## 2015-07-09 NOTE — Care Management (Signed)
Discharge to home today per Dr. Renae Gloss. Discussed home health agencies with Mr. Terral at the bedside. States that he has had [facility] in the past and would like them again. Will update Feliberto Gottron, [facility] representative. Will be transported to home per [facility] unit. States he will call and make sure his roommate is at home prior to calling rescue. Gwenette Greet RN MSN CCM Care Management [phone]

## 2015-07-09 NOTE — Progress Notes (Signed)
Foley catheter leaking.  Put an additional of water in balloon.

## 2015-07-10 ENCOUNTER — Telehealth: Payer: Self-pay

## 2015-07-10 LAB — CULTURE, BLOOD (ROUTINE X 2): CULTURE: NO GROWTH

## 2015-07-10 NOTE — Telephone Encounter (Signed)
Initial attempt to contact pt for TCM outreach. Unable to leave voicemail at this time.

## 2015-07-12 LAB — CULTURE, BLOOD (ROUTINE X 2)
CULTURE: NO GROWTH
Culture: NO GROWTH

## 2015-07-14 DIAGNOSIS — S81802D Unspecified open wound, left lower leg, subsequent encounter: Secondary | ICD-10-CM | POA: Diagnosis not present

## 2015-07-14 DIAGNOSIS — Z7951 Long term (current) use of inhaled steroids: Secondary | ICD-10-CM | POA: Diagnosis not present

## 2015-07-14 DIAGNOSIS — E785 Hyperlipidemia, unspecified: Secondary | ICD-10-CM | POA: Diagnosis not present

## 2015-07-14 DIAGNOSIS — G35 Multiple sclerosis: Secondary | ICD-10-CM | POA: Diagnosis not present

## 2015-07-14 DIAGNOSIS — N390 Urinary tract infection, site not specified: Secondary | ICD-10-CM | POA: Diagnosis not present

## 2015-07-14 DIAGNOSIS — I10 Essential (primary) hypertension: Secondary | ICD-10-CM | POA: Diagnosis not present

## 2015-07-14 DIAGNOSIS — K219 Gastro-esophageal reflux disease without esophagitis: Secondary | ICD-10-CM | POA: Diagnosis not present

## 2015-07-15 DIAGNOSIS — N390 Urinary tract infection, site not specified: Secondary | ICD-10-CM | POA: Diagnosis not present

## 2015-07-15 DIAGNOSIS — S81802D Unspecified open wound, left lower leg, subsequent encounter: Secondary | ICD-10-CM | POA: Diagnosis not present

## 2015-07-15 DIAGNOSIS — E785 Hyperlipidemia, unspecified: Secondary | ICD-10-CM | POA: Diagnosis not present

## 2015-07-15 DIAGNOSIS — I10 Essential (primary) hypertension: Secondary | ICD-10-CM | POA: Diagnosis not present

## 2015-07-15 DIAGNOSIS — Z7951 Long term (current) use of inhaled steroids: Secondary | ICD-10-CM | POA: Diagnosis not present

## 2015-07-15 DIAGNOSIS — K219 Gastro-esophageal reflux disease without esophagitis: Secondary | ICD-10-CM | POA: Diagnosis not present

## 2015-07-15 DIAGNOSIS — G35 Multiple sclerosis: Secondary | ICD-10-CM | POA: Diagnosis not present

## 2015-07-16 ENCOUNTER — Ambulatory Visit: Payer: Medicare Other | Admitting: Family Medicine

## 2015-07-17 DIAGNOSIS — E785 Hyperlipidemia, unspecified: Secondary | ICD-10-CM | POA: Diagnosis not present

## 2015-07-17 DIAGNOSIS — N390 Urinary tract infection, site not specified: Secondary | ICD-10-CM | POA: Diagnosis not present

## 2015-07-17 DIAGNOSIS — I10 Essential (primary) hypertension: Secondary | ICD-10-CM | POA: Diagnosis not present

## 2015-07-17 DIAGNOSIS — K219 Gastro-esophageal reflux disease without esophagitis: Secondary | ICD-10-CM | POA: Diagnosis not present

## 2015-07-17 DIAGNOSIS — S81802D Unspecified open wound, left lower leg, subsequent encounter: Secondary | ICD-10-CM | POA: Diagnosis not present

## 2015-07-17 DIAGNOSIS — Z7951 Long term (current) use of inhaled steroids: Secondary | ICD-10-CM | POA: Diagnosis not present

## 2015-07-17 DIAGNOSIS — G35 Multiple sclerosis: Secondary | ICD-10-CM | POA: Diagnosis not present

## 2015-07-19 DIAGNOSIS — L8992 Pressure ulcer of unspecified site, stage 2: Secondary | ICD-10-CM | POA: Diagnosis not present

## 2015-07-19 DIAGNOSIS — J9690 Respiratory failure, unspecified, unspecified whether with hypoxia or hypercapnia: Secondary | ICD-10-CM | POA: Diagnosis not present

## 2015-07-20 DIAGNOSIS — I10 Essential (primary) hypertension: Secondary | ICD-10-CM | POA: Diagnosis not present

## 2015-07-20 DIAGNOSIS — K219 Gastro-esophageal reflux disease without esophagitis: Secondary | ICD-10-CM | POA: Diagnosis not present

## 2015-07-20 DIAGNOSIS — Z7951 Long term (current) use of inhaled steroids: Secondary | ICD-10-CM | POA: Diagnosis not present

## 2015-07-20 DIAGNOSIS — G35 Multiple sclerosis: Secondary | ICD-10-CM | POA: Diagnosis not present

## 2015-07-20 DIAGNOSIS — N390 Urinary tract infection, site not specified: Secondary | ICD-10-CM | POA: Diagnosis not present

## 2015-07-20 DIAGNOSIS — E785 Hyperlipidemia, unspecified: Secondary | ICD-10-CM | POA: Diagnosis not present

## 2015-07-20 DIAGNOSIS — S81802D Unspecified open wound, left lower leg, subsequent encounter: Secondary | ICD-10-CM | POA: Diagnosis not present

## 2015-07-21 ENCOUNTER — Other Ambulatory Visit: Payer: Self-pay | Admitting: Family Medicine

## 2015-07-21 DIAGNOSIS — E785 Hyperlipidemia, unspecified: Secondary | ICD-10-CM | POA: Diagnosis not present

## 2015-07-21 DIAGNOSIS — S81802D Unspecified open wound, left lower leg, subsequent encounter: Secondary | ICD-10-CM | POA: Diagnosis not present

## 2015-07-21 DIAGNOSIS — G35 Multiple sclerosis: Secondary | ICD-10-CM | POA: Diagnosis not present

## 2015-07-21 DIAGNOSIS — K219 Gastro-esophageal reflux disease without esophagitis: Secondary | ICD-10-CM | POA: Diagnosis not present

## 2015-07-21 DIAGNOSIS — Z7951 Long term (current) use of inhaled steroids: Secondary | ICD-10-CM | POA: Diagnosis not present

## 2015-07-21 DIAGNOSIS — I10 Essential (primary) hypertension: Secondary | ICD-10-CM | POA: Diagnosis not present

## 2015-07-21 DIAGNOSIS — N390 Urinary tract infection, site not specified: Secondary | ICD-10-CM | POA: Diagnosis not present

## 2015-07-22 DIAGNOSIS — N390 Urinary tract infection, site not specified: Secondary | ICD-10-CM | POA: Diagnosis not present

## 2015-07-22 DIAGNOSIS — E785 Hyperlipidemia, unspecified: Secondary | ICD-10-CM | POA: Diagnosis not present

## 2015-07-22 DIAGNOSIS — S81802D Unspecified open wound, left lower leg, subsequent encounter: Secondary | ICD-10-CM | POA: Diagnosis not present

## 2015-07-22 DIAGNOSIS — G35 Multiple sclerosis: Secondary | ICD-10-CM | POA: Diagnosis not present

## 2015-07-22 DIAGNOSIS — Z7951 Long term (current) use of inhaled steroids: Secondary | ICD-10-CM | POA: Diagnosis not present

## 2015-07-22 DIAGNOSIS — I10 Essential (primary) hypertension: Secondary | ICD-10-CM | POA: Diagnosis not present

## 2015-07-22 DIAGNOSIS — K219 Gastro-esophageal reflux disease without esophagitis: Secondary | ICD-10-CM | POA: Diagnosis not present

## 2015-07-22 NOTE — Telephone Encounter (Signed)
Last office visit 04/24/2015.  Last refilled 06/05/2015 for #30 with no refills.  Ok to refill?

## 2015-07-23 MED ORDER — GABAPENTIN 300 MG PO CAPS: 900.0000 mg | ORAL_CAPSULE | Freq: Three times a day (TID) | ORAL | Status: DC

## 2015-07-23 NOTE — Telephone Encounter (Signed)
Diazepam called into [facility] [address] 

## 2015-07-24 ENCOUNTER — Encounter: Payer: Self-pay | Admitting: Family Medicine

## 2015-07-24 ENCOUNTER — Ambulatory Visit (INDEPENDENT_AMBULATORY_CARE_PROVIDER_SITE_OTHER): Payer: Medicare Other | Admitting: Family Medicine

## 2015-07-24 VITALS — BP 164/106 | HR 48 | Temp 98.5°F

## 2015-07-24 DIAGNOSIS — G35 Multiple sclerosis: Secondary | ICD-10-CM | POA: Diagnosis not present

## 2015-07-24 DIAGNOSIS — S81802D Unspecified open wound, left lower leg, subsequent encounter: Secondary | ICD-10-CM | POA: Diagnosis not present

## 2015-07-24 DIAGNOSIS — K219 Gastro-esophageal reflux disease without esophagitis: Secondary | ICD-10-CM | POA: Diagnosis not present

## 2015-07-24 DIAGNOSIS — E785 Hyperlipidemia, unspecified: Secondary | ICD-10-CM | POA: Diagnosis not present

## 2015-07-24 DIAGNOSIS — Z960 Presence of urogenital implants: Secondary | ICD-10-CM | POA: Diagnosis not present

## 2015-07-24 DIAGNOSIS — Z7951 Long term (current) use of inhaled steroids: Secondary | ICD-10-CM | POA: Diagnosis not present

## 2015-07-24 DIAGNOSIS — I10 Essential (primary) hypertension: Secondary | ICD-10-CM

## 2015-07-24 DIAGNOSIS — N390 Urinary tract infection, site not specified: Secondary | ICD-10-CM | POA: Diagnosis not present

## 2015-07-24 DIAGNOSIS — T83511D Infection and inflammatory reaction due to indwelling urethral catheter, subsequent encounter: Secondary | ICD-10-CM

## 2015-07-24 NOTE — Patient Instructions (Addendum)
Take BP med as soon as at home.  Follow BP at home.. Goal < 140/90

## 2015-07-24 NOTE — Assessment & Plan Note (Signed)
UTI and clinical sepsis now resolved after completion course of augmentin.

## 2015-07-24 NOTE — Assessment & Plan Note (Signed)
Elevated today as he did not take his medication yet.  Follow BP at home to make sure it is at goal < 140/90.

## 2015-07-24 NOTE — Progress Notes (Signed)
Subjective:    Patient ID: Travis Cantrell, male    DOB: Jan 15, 1961, 55 y.o.   MRN: [id number]  HPI  55 year old male with severe MS wheelchair bound presents for hospital follow up. Unable to complete TCM call.  He was admitted on 6/11 to 6/15 for acute UTI, associated with foley catheter and resulting urospesis.  Hospital Course HOSHospital CourseHoiPITAL COURE:   1. Clinical sepsis, catheter related urinary tract infection, leukocytosis, fever and tachycardia. Initial blood cultures came back positive for staph species which is likely contaminant. Unfortunately urine culture came back as multiple organisms. Foley catheter was changed. Patient was given aggressive antibiotics initially and switched over to Unasyn and then switched over to Augmentin upon discharge home. Patient was seen in consultation by Dr. Sampson Goon infectious disease to help out with antibiotics. Patient's white count came down into the normal range and patient was a afebrile upon discharge. Home health nurse to change Foley every 3 weeks. 2. History of multiple sclerosis and bedbound. A friend takes care of him. We'll also set up home health. 3. Essential hypertension on lisinopril metoprolol 4. Acute kidney injury. Patient was given IV fluid hydration and creatinine normalized upon discharge. 5. Hypokalemia this was replaced upon discharge 6. Hyperlipidemia unspecified on atorvastatin 7. Anxiety on fluoxetine and Valium 8. gastroesophageal reflux disease on Protonix 9. Thrush -nystatin swish and swallow          Today he reports:  Completed antibiotics.  No further urinary issues.  No current change in foley.. Working well.  No odor. No abd pain, no fever.  Still using  Nystatin for oral thrush.  Has has not taken BP med  ( liniopril 10, metoprolol)yet today given he has not eaten yet. BP Readings from Last 3 Encounters:  07/24/15 164/106  07/09/15 163/90  04/24/15 152/108     Social History  /Family History/Past Medical History reviewed and updated if needed.   Review of Systems  Constitutional: Negative for fever and fatigue.  HENT: Negative for ear pain.   Eyes: Negative for pain.  Respiratory: Negative for cough and shortness of breath.   Cardiovascular: Negative for chest pain, palpitations and leg swelling.  Gastrointestinal: Negative for abdominal pain.  Genitourinary: Negative for dysuria.  Musculoskeletal: Negative for arthralgias.  Neurological: Negative for syncope, light-headedness and headaches.  Psychiatric/Behavioral: Negative for dysphoric mood.       Objective:   Physical Exam  Constitutional: He is oriented to person, place, and time.  Wheel chair bound, slumped over forward and to left in chair. Difficulty keeping head up Unkempt  HENT:  Head: Normocephalic and atraumatic.  Right Ear: External ear normal. No middle ear effusion.  Left Ear: External ear normal.  No middle ear effusion.  Nose: Nose normal. No mucosal edema or sinus tenderness. Right sinus exhibits no maxillary sinus tenderness. Left sinus exhibits no maxillary sinus tenderness.  Mouth/Throat: Oropharynx is clear and moist. No posterior oropharyngeal edema or posterior oropharyngeal erythema.  Eyes: Conjunctivae and EOM are normal. Pupils are equal, round, and reactive to light.  Neck: Normal range of motion. Neck supple. No thyromegaly present.  Cardiovascular: Normal rate, regular rhythm, normal heart sounds and intact distal pulses.  Exam reveals no gallop and no friction rub.   No murmur heard. Pulmonary/Chest: Effort normal and breath sounds normal. No respiratory distress. He has no wheezes. He has no rales. He exhibits no tenderness.  Abdominal: Soft.  Neurological: He is alert and oriented to person, place, and time.  He displays atrophy. He exhibits abnormal muscle tone.  He has 0/5 strenght in B Lext and 2-3/5 strength in upper extremities.. Most strength in left arm.  Contractures in right arm that cause pain.  Skin: Skin is warm.  B toenails thickened deformed and discolored.          Assessment & Plan:

## 2015-07-24 NOTE — Progress Notes (Signed)
Pre visit review using our clinic review tool, if applicable. No additional management support is needed unless otherwise documented below in the visit note. 

## 2015-07-24 NOTE — Assessment & Plan Note (Signed)
At this point stable. Pt refuses further treatment.

## 2015-07-27 DIAGNOSIS — I10 Essential (primary) hypertension: Secondary | ICD-10-CM | POA: Diagnosis not present

## 2015-07-27 DIAGNOSIS — N390 Urinary tract infection, site not specified: Secondary | ICD-10-CM | POA: Diagnosis not present

## 2015-07-27 DIAGNOSIS — S81802D Unspecified open wound, left lower leg, subsequent encounter: Secondary | ICD-10-CM | POA: Diagnosis not present

## 2015-07-27 DIAGNOSIS — Z7951 Long term (current) use of inhaled steroids: Secondary | ICD-10-CM | POA: Diagnosis not present

## 2015-07-27 DIAGNOSIS — K219 Gastro-esophageal reflux disease without esophagitis: Secondary | ICD-10-CM | POA: Diagnosis not present

## 2015-07-27 DIAGNOSIS — G35 Multiple sclerosis: Secondary | ICD-10-CM | POA: Diagnosis not present

## 2015-07-27 DIAGNOSIS — E785 Hyperlipidemia, unspecified: Secondary | ICD-10-CM | POA: Diagnosis not present

## 2015-07-30 DIAGNOSIS — N390 Urinary tract infection, site not specified: Secondary | ICD-10-CM | POA: Diagnosis not present

## 2015-07-30 DIAGNOSIS — S81802D Unspecified open wound, left lower leg, subsequent encounter: Secondary | ICD-10-CM | POA: Diagnosis not present

## 2015-07-30 DIAGNOSIS — E785 Hyperlipidemia, unspecified: Secondary | ICD-10-CM | POA: Diagnosis not present

## 2015-07-30 DIAGNOSIS — Z7951 Long term (current) use of inhaled steroids: Secondary | ICD-10-CM | POA: Diagnosis not present

## 2015-07-30 DIAGNOSIS — G35 Multiple sclerosis: Secondary | ICD-10-CM | POA: Diagnosis not present

## 2015-07-30 DIAGNOSIS — I10 Essential (primary) hypertension: Secondary | ICD-10-CM | POA: Diagnosis not present

## 2015-07-30 DIAGNOSIS — K219 Gastro-esophageal reflux disease without esophagitis: Secondary | ICD-10-CM | POA: Diagnosis not present

## 2015-07-31 DIAGNOSIS — K219 Gastro-esophageal reflux disease without esophagitis: Secondary | ICD-10-CM | POA: Diagnosis not present

## 2015-07-31 DIAGNOSIS — Z7951 Long term (current) use of inhaled steroids: Secondary | ICD-10-CM | POA: Diagnosis not present

## 2015-07-31 DIAGNOSIS — I10 Essential (primary) hypertension: Secondary | ICD-10-CM | POA: Diagnosis not present

## 2015-07-31 DIAGNOSIS — E785 Hyperlipidemia, unspecified: Secondary | ICD-10-CM | POA: Diagnosis not present

## 2015-07-31 DIAGNOSIS — N390 Urinary tract infection, site not specified: Secondary | ICD-10-CM | POA: Diagnosis not present

## 2015-07-31 DIAGNOSIS — G35 Multiple sclerosis: Secondary | ICD-10-CM | POA: Diagnosis not present

## 2015-07-31 DIAGNOSIS — S81802D Unspecified open wound, left lower leg, subsequent encounter: Secondary | ICD-10-CM | POA: Diagnosis not present

## 2015-08-03 DIAGNOSIS — Z7951 Long term (current) use of inhaled steroids: Secondary | ICD-10-CM | POA: Diagnosis not present

## 2015-08-03 DIAGNOSIS — K219 Gastro-esophageal reflux disease without esophagitis: Secondary | ICD-10-CM | POA: Diagnosis not present

## 2015-08-03 DIAGNOSIS — G35 Multiple sclerosis: Secondary | ICD-10-CM | POA: Diagnosis not present

## 2015-08-03 DIAGNOSIS — I10 Essential (primary) hypertension: Secondary | ICD-10-CM | POA: Diagnosis not present

## 2015-08-03 DIAGNOSIS — S81802D Unspecified open wound, left lower leg, subsequent encounter: Secondary | ICD-10-CM | POA: Diagnosis not present

## 2015-08-03 DIAGNOSIS — N390 Urinary tract infection, site not specified: Secondary | ICD-10-CM | POA: Diagnosis not present

## 2015-08-03 DIAGNOSIS — E785 Hyperlipidemia, unspecified: Secondary | ICD-10-CM | POA: Diagnosis not present

## 2015-08-05 DIAGNOSIS — N390 Urinary tract infection, site not specified: Secondary | ICD-10-CM | POA: Diagnosis not present

## 2015-08-05 DIAGNOSIS — S81802D Unspecified open wound, left lower leg, subsequent encounter: Secondary | ICD-10-CM | POA: Diagnosis not present

## 2015-08-05 DIAGNOSIS — I10 Essential (primary) hypertension: Secondary | ICD-10-CM | POA: Diagnosis not present

## 2015-08-05 DIAGNOSIS — G35 Multiple sclerosis: Secondary | ICD-10-CM | POA: Diagnosis not present

## 2015-08-05 DIAGNOSIS — E785 Hyperlipidemia, unspecified: Secondary | ICD-10-CM | POA: Diagnosis not present

## 2015-08-05 DIAGNOSIS — K219 Gastro-esophageal reflux disease without esophagitis: Secondary | ICD-10-CM | POA: Diagnosis not present

## 2015-08-05 DIAGNOSIS — Z7951 Long term (current) use of inhaled steroids: Secondary | ICD-10-CM | POA: Diagnosis not present

## 2015-08-06 ENCOUNTER — Other Ambulatory Visit: Payer: Self-pay | Admitting: Family Medicine

## 2015-08-07 DIAGNOSIS — E785 Hyperlipidemia, unspecified: Secondary | ICD-10-CM | POA: Diagnosis not present

## 2015-08-07 DIAGNOSIS — N390 Urinary tract infection, site not specified: Secondary | ICD-10-CM | POA: Diagnosis not present

## 2015-08-07 DIAGNOSIS — Z7951 Long term (current) use of inhaled steroids: Secondary | ICD-10-CM | POA: Diagnosis not present

## 2015-08-07 DIAGNOSIS — G35 Multiple sclerosis: Secondary | ICD-10-CM | POA: Diagnosis not present

## 2015-08-07 DIAGNOSIS — K219 Gastro-esophageal reflux disease without esophagitis: Secondary | ICD-10-CM | POA: Diagnosis not present

## 2015-08-07 DIAGNOSIS — S81802D Unspecified open wound, left lower leg, subsequent encounter: Secondary | ICD-10-CM | POA: Diagnosis not present

## 2015-08-07 DIAGNOSIS — I10 Essential (primary) hypertension: Secondary | ICD-10-CM | POA: Diagnosis not present

## 2015-08-10 DIAGNOSIS — Z7951 Long term (current) use of inhaled steroids: Secondary | ICD-10-CM | POA: Diagnosis not present

## 2015-08-10 DIAGNOSIS — K219 Gastro-esophageal reflux disease without esophagitis: Secondary | ICD-10-CM | POA: Diagnosis not present

## 2015-08-10 DIAGNOSIS — N390 Urinary tract infection, site not specified: Secondary | ICD-10-CM | POA: Diagnosis not present

## 2015-08-10 DIAGNOSIS — G35 Multiple sclerosis: Secondary | ICD-10-CM | POA: Diagnosis not present

## 2015-08-10 DIAGNOSIS — S81802D Unspecified open wound, left lower leg, subsequent encounter: Secondary | ICD-10-CM | POA: Diagnosis not present

## 2015-08-10 DIAGNOSIS — E785 Hyperlipidemia, unspecified: Secondary | ICD-10-CM | POA: Diagnosis not present

## 2015-08-10 DIAGNOSIS — I10 Essential (primary) hypertension: Secondary | ICD-10-CM | POA: Diagnosis not present

## 2015-08-12 DIAGNOSIS — Z7951 Long term (current) use of inhaled steroids: Secondary | ICD-10-CM | POA: Diagnosis not present

## 2015-08-12 DIAGNOSIS — K219 Gastro-esophageal reflux disease without esophagitis: Secondary | ICD-10-CM | POA: Diagnosis not present

## 2015-08-12 DIAGNOSIS — I10 Essential (primary) hypertension: Secondary | ICD-10-CM | POA: Diagnosis not present

## 2015-08-12 DIAGNOSIS — N390 Urinary tract infection, site not specified: Secondary | ICD-10-CM | POA: Diagnosis not present

## 2015-08-12 DIAGNOSIS — E785 Hyperlipidemia, unspecified: Secondary | ICD-10-CM | POA: Diagnosis not present

## 2015-08-12 DIAGNOSIS — R32 Unspecified urinary incontinence: Secondary | ICD-10-CM | POA: Diagnosis not present

## 2015-08-12 DIAGNOSIS — S81802D Unspecified open wound, left lower leg, subsequent encounter: Secondary | ICD-10-CM | POA: Diagnosis not present

## 2015-08-12 DIAGNOSIS — L89152 Pressure ulcer of sacral region, stage 2: Secondary | ICD-10-CM | POA: Diagnosis not present

## 2015-08-12 DIAGNOSIS — G35 Multiple sclerosis: Secondary | ICD-10-CM | POA: Diagnosis not present

## 2015-08-14 DIAGNOSIS — G35 Multiple sclerosis: Secondary | ICD-10-CM | POA: Diagnosis not present

## 2015-08-14 DIAGNOSIS — K219 Gastro-esophageal reflux disease without esophagitis: Secondary | ICD-10-CM | POA: Diagnosis not present

## 2015-08-14 DIAGNOSIS — N390 Urinary tract infection, site not specified: Secondary | ICD-10-CM | POA: Diagnosis not present

## 2015-08-14 DIAGNOSIS — S81802D Unspecified open wound, left lower leg, subsequent encounter: Secondary | ICD-10-CM | POA: Diagnosis not present

## 2015-08-14 DIAGNOSIS — E785 Hyperlipidemia, unspecified: Secondary | ICD-10-CM | POA: Diagnosis not present

## 2015-08-14 DIAGNOSIS — I10 Essential (primary) hypertension: Secondary | ICD-10-CM | POA: Diagnosis not present

## 2015-08-14 DIAGNOSIS — Z7951 Long term (current) use of inhaled steroids: Secondary | ICD-10-CM | POA: Diagnosis not present

## 2015-08-18 DIAGNOSIS — L8992 Pressure ulcer of unspecified site, stage 2: Secondary | ICD-10-CM | POA: Diagnosis not present

## 2015-08-18 DIAGNOSIS — J9690 Respiratory failure, unspecified, unspecified whether with hypoxia or hypercapnia: Secondary | ICD-10-CM | POA: Diagnosis not present

## 2015-08-19 ENCOUNTER — Other Ambulatory Visit: Payer: Self-pay | Admitting: Family Medicine

## 2015-08-19 NOTE — Telephone Encounter (Signed)
Last office visit 07/24/2015.  Last refilled 07/23/2015.  Ok to refill?

## 2015-08-20 NOTE — Telephone Encounter (Signed)
Diazepam called into [facility] [address] 

## 2015-08-26 ENCOUNTER — Other Ambulatory Visit: Payer: Self-pay | Admitting: *Deleted

## 2015-08-26 MED ORDER — GABAPENTIN 300 MG PO CAPS: 900.0000 mg | ORAL_CAPSULE | Freq: Three times a day (TID) | ORAL | 0 refills | Status: DC

## 2015-08-26 NOTE — Telephone Encounter (Signed)
Last office visit 04/23/2015.  Last refilled 07/23/2015 for #270 with no refills.  Ok to refill?

## 2015-09-18 DIAGNOSIS — L8992 Pressure ulcer of unspecified site, stage 2: Secondary | ICD-10-CM | POA: Diagnosis not present

## 2015-09-18 DIAGNOSIS — J9690 Respiratory failure, unspecified, unspecified whether with hypoxia or hypercapnia: Secondary | ICD-10-CM | POA: Diagnosis not present

## 2015-09-22 ENCOUNTER — Other Ambulatory Visit: Payer: Self-pay | Admitting: Family Medicine

## 2015-09-22 NOTE — Telephone Encounter (Signed)
Diazepam called into [facility] [address] 

## 2015-09-22 NOTE — Telephone Encounter (Signed)
Last office visit 07/24/2015.  Last refilled 08/19/2015 for #30 with no refills.  Ok to refill?

## 2015-09-25 ENCOUNTER — Telehealth: Payer: Self-pay | Admitting: *Deleted

## 2015-09-25 NOTE — Telephone Encounter (Signed)
TC to PT to schedule AWV. LVM to call back to schedule. 

## 2015-10-01 DIAGNOSIS — L89152 Pressure ulcer of sacral region, stage 2: Secondary | ICD-10-CM | POA: Diagnosis not present

## 2015-10-06 ENCOUNTER — Telehealth: Payer: Self-pay

## 2015-10-06 NOTE — Telephone Encounter (Signed)
Isn't there a power company form or do I write this on prescription pad free hand?

## 2015-10-06 NOTE — Telephone Encounter (Signed)
Chelsea pts step daughter (Do not see DPR) left v/m requesting rx and authorization for medical necessity that pt has power w/c. pts present power w/c has broken and the manufacturing co of present power chair does not exist any longer.  This is pts only way to get around. Pt last seen 07/24/15 and has appt with Dr Ermalene Searing 10/23/15. chelsea request cb.

## 2015-10-07 NOTE — Telephone Encounter (Signed)
Spoke with [facility] and advised getting a new electric wheelchair is not as easy as putting an order in. I advised that the insurance will require a fact to face office visit and there is a lot of paperwork involved with this process.  She has not way to get him to the office.  Mr. Cumberbatch doesn't even have a manuel wheelchair.  I advised I would send my contact person at [facility] a note to see if they could give my any direction on how to handle this particular situation.    Chelsea also states that Mr. Haxton may be getting a UTI and Mr. Liszewski told her that any time he felt like he was getting an UTI to call our office and Dr. Ermalene Searing would prescribe him antibiotics.  He did go see the urologist and they prescribed a medication he could afford and instructed them to flush catheter with 1/2 white vinegar and 1/2 water to help break up the sediment.  When they tried that , he ended up back in the hospital.  [name] states that his urine is dark and has a bad odor.  I advised I would send a note to Dr. Ermalene Searing to advise about prescribing the antibiotics.

## 2015-10-07 NOTE — Telephone Encounter (Signed)
Response from [facility]:  We have given this pt a manual wc 03/13/2012 through his [facility] insurance. We wouldn't be able provide a new wc for him through insurance, because it hasn't been 5 years. We wouldn't be able to do anything until 03/13/2017. There are no records that the wc was ever picked up. The only way to provide the wc is through private pay pick up at [facility].

## 2015-10-08 ENCOUNTER — Other Ambulatory Visit: Payer: Self-pay | Admitting: Family Medicine

## 2015-10-08 DIAGNOSIS — Z87440 Personal history of urinary (tract) infections: Secondary | ICD-10-CM

## 2015-10-08 DIAGNOSIS — R8290 Unspecified abnormal findings in urine: Secondary | ICD-10-CM | POA: Diagnosis not present

## 2015-10-08 MED ORDER — CEPHALEXIN 500 MG PO CAPS: 500.0000 mg | ORAL_CAPSULE | Freq: Four times a day (QID) | ORAL | 3 refills | Status: AC

## 2015-10-08 NOTE — Telephone Encounter (Signed)
Note from [facility]:  We have a dpt that works specifically on [employer]. I have emailed this message to them for follow up. Thanks!

## 2015-10-08 NOTE — Telephone Encounter (Signed)
Chelsea notified as instructed by telephone.  She will stop by office to pick up a sterile urine cup and collect urine sample from catheter.

## 2015-10-08 NOTE — Telephone Encounter (Signed)
Sent in antibiotics.  If she can bring in an in/out cath sample (not from bag) for Urine culture before starting ANTIBIOTICS, that would be ideal. He can start the antibitoics as soon as the sample is collected.. Does not need to await U culture results.

## 2015-10-08 NOTE — Telephone Encounter (Signed)
[facility] us paperwork to start filling out to start process to order an electric wheelchair.

## 2015-10-08 NOTE — Telephone Encounter (Signed)
Last office visit 07/24/2015.  Last refilled 08/19/2015 for #90 with no refills.  Ok to refill?

## 2015-10-10 LAB — URINE CULTURE

## 2015-10-12 NOTE — Telephone Encounter (Signed)
Travis Cantrell returned your  Best number [phone]

## 2015-10-12 NOTE — Telephone Encounter (Signed)
Spoke with [facility] and advised Travis Cantrell's urine culture was negative and he does not need to take the antibiotics.  She states the prescription has not been picked up so they will call [facility] and have them put that medication back on the shelf.  She also was inquiring if I had heard anything back from [facility] in regards to the [facility]. I advised the last message I received stated that they have a department that specifically works on the [facility] and they had emailed the message I originally sent for them to follow up.  So far I have not heard anything back from that department.  I recommended that she call [facility] and talk to someone in the [facility] department to let them know the urgent situation Travis Cantrell is in.  She states she will give them a call later today.

## 2015-10-16 ENCOUNTER — Telehealth: Payer: Self-pay

## 2015-10-16 NOTE — Telephone Encounter (Signed)
Travis Cantrell (do not see DPR) spoke with [facility] today and [facility] is faxing information to be filled out re; to w/c. Pt has face to face appt on 10/23/15. FYI to Dr Ermalene Searing.

## 2015-10-19 DIAGNOSIS — J9690 Respiratory failure, unspecified, unspecified whether with hypoxia or hypercapnia: Secondary | ICD-10-CM | POA: Diagnosis not present

## 2015-10-19 DIAGNOSIS — L8992 Pressure ulcer of unspecified site, stage 2: Secondary | ICD-10-CM | POA: Diagnosis not present

## 2015-10-23 ENCOUNTER — Encounter: Payer: Self-pay | Admitting: Family Medicine

## 2015-10-23 ENCOUNTER — Ambulatory Visit (INDEPENDENT_AMBULATORY_CARE_PROVIDER_SITE_OTHER): Payer: Medicare Other | Admitting: Family Medicine

## 2015-10-23 VITALS — BP 172/112 | HR 50 | Temp 98.4°F

## 2015-10-23 DIAGNOSIS — Z23 Encounter for immunization: Secondary | ICD-10-CM | POA: Diagnosis not present

## 2015-10-23 DIAGNOSIS — G35 Multiple sclerosis: Secondary | ICD-10-CM

## 2015-10-23 DIAGNOSIS — I10 Essential (primary) hypertension: Secondary | ICD-10-CM | POA: Diagnosis not present

## 2015-10-23 DIAGNOSIS — N390 Urinary tract infection, site not specified: Secondary | ICD-10-CM

## 2015-10-23 NOTE — Progress Notes (Signed)
   Subjective:    Patient ID: Travis Cantrell, male    DOB: September 06, 1960, 55 y.o.   MRN: [id number]  HPI   55 year old male with severe MS, wheelchair bound presents for 3 month follow up.   Given flu shot today.   Hypertension:  Poor control today on metoprolol  XL 25 mg daily. He has not taken his BP med yet toda.  BP Readings from Last 3 Encounters:  10/23/15 (!) 172/112  07/24/15 (!) 164/106  07/09/15 (!) 163/90  Using medication without problems or lightheadedness:  None Chest pain with exertion: None Edema: stbale Short of breath: None Average home BPs: not checking, does not have a cuff. Other issues:  No current urinary symptoms at this time. Urine is pretty clear. Changed catheter yesterday.   Family history of HTN.     Review of Systems  Constitutional: Negative for fatigue.  HENT: Negative for ear pain.   Eyes: Negative for pain.  Respiratory: Negative for shortness of breath.   Cardiovascular: Negative for chest pain.  Gastrointestinal: Negative for abdominal distention.  Skin:       Hit left anterior leg today with Hoyer lift, clear discharge, skin abraided       Objective:   Physical Exam  Constitutional: He is oriented to person, place, and time.  Wheel chair bound, slumped over forward and to left in chair. Difficulty keeping head up Unkempt  HENT:  Head: Normocephalic and atraumatic.  Right Ear: External ear normal. No middle ear effusion.  Left Ear: External ear normal.  No middle ear effusion.  Nose: Nose normal. No mucosal edema or sinus tenderness. Right sinus exhibits no maxillary sinus tenderness. Left sinus exhibits no maxillary sinus tenderness.  Mouth/Throat: Oropharynx is clear and moist. No posterior oropharyngeal edema or posterior oropharyngeal erythema.  Eyes: Conjunctivae and EOM are normal. Pupils are equal, round, and reactive to light.  Neck: Normal range of motion. Neck supple. No thyromegaly present.  Cardiovascular: Normal rate,  regular rhythm, normal heart sounds and intact distal pulses.  Exam reveals no gallop and no friction rub.   No murmur heard. Pulmonary/Chest: Effort normal and breath sounds normal. No respiratory distress. He has no wheezes. He has no rales. He exhibits no tenderness.  Abdominal: Soft.  Neurological: He is alert and oriented to person, place, and time. He displays atrophy. He exhibits abnormal muscle tone.  He has 0/5 strenght in B Lext and 2-3/5 strength in upper extremities.. Most strength in left arm. Contractures in right arm that cause pain.  Skin: Skin is warm.  B toenails thickened deformed and discolored.   Skin: left anterior leg with abrasion , skin sloughed off, , clear discharge, no surrounding erythema, area bandaged.       Assessment & Plan:

## 2015-10-23 NOTE — Patient Instructions (Addendum)
Continue metprolol for now. Check blood pressure at home daily.. If > 14o/90 in next week call for additional BP medication.  We will plan on  Changing metoprolol to  Ad different medication if it is elevated.

## 2015-10-23 NOTE — Assessment & Plan Note (Signed)
No current symptoms.  Has rx to use antibiotics prn symptoms, then to bring in sample for culture.

## 2015-10-23 NOTE — Assessment & Plan Note (Signed)
Poor control. Pt refuses med additional today. Will instead check at home and if remaining high.. Will call for change of metoprolol to a different medication . Follow up BP in 2-4 weeks.

## 2015-10-23 NOTE — Progress Notes (Signed)
Pre visit review using our clinic review tool, if applicable. No additional management support is needed unless otherwise documented below in the visit note. 

## 2015-10-23 NOTE — Assessment & Plan Note (Signed)
Stable. Refuses to be followed by Neuro at this point.

## 2015-10-27 ENCOUNTER — Emergency Department
Admission: EM | Admit: 2015-10-27 | Discharge: 2015-10-28 | Disposition: A | Discharge status: Home / Self Care | Payer: Medicare Other | Attending: Emergency Medicine | Admitting: Emergency Medicine

## 2015-10-27 ENCOUNTER — Telehealth: Payer: Self-pay | Admitting: Family Medicine

## 2015-10-27 ENCOUNTER — Encounter: Payer: Self-pay | Admitting: Medical Oncology

## 2015-10-27 DIAGNOSIS — Z79899 Other long term (current) drug therapy: Secondary | ICD-10-CM | POA: Diagnosis not present

## 2015-10-27 DIAGNOSIS — Z7982 Long term (current) use of aspirin: Secondary | ICD-10-CM | POA: Diagnosis not present

## 2015-10-27 DIAGNOSIS — N3000 Acute cystitis without hematuria: Secondary | ICD-10-CM | POA: Diagnosis not present

## 2015-10-27 DIAGNOSIS — R030 Elevated blood-pressure reading, without diagnosis of hypertension: Secondary | ICD-10-CM | POA: Diagnosis not present

## 2015-10-27 DIAGNOSIS — I10 Essential (primary) hypertension: Secondary | ICD-10-CM | POA: Insufficient documentation

## 2015-10-27 LAB — URINALYSIS COMPLETE WITH MICROSCOPIC (ARMC ONLY)
BILIRUBIN URINE: NEGATIVE
Glucose, UA: NEGATIVE mg/dL
KETONES UR: NEGATIVE mg/dL
NITRITE: POSITIVE — AB
PH: 7 (ref 5.0–8.0)
PROTEIN: 100 mg/dL — AB
SPECIFIC GRAVITY, URINE: 1.019 (ref 1.005–1.030)
Squamous Epithelial / LPF: NONE SEEN

## 2015-10-27 LAB — CBC WITH DIFFERENTIAL/PLATELET
BASOS ABS: 0.1 10*3/uL (ref 0–0.1)
Basophils Relative: 1 %
Eosinophils Absolute: 0.7 10*3/uL (ref 0–0.7)
Eosinophils Relative: 5 %
HEMATOCRIT: 43.6 % (ref 40.0–52.0)
HEMOGLOBIN: 13.6 g/dL (ref 13.0–18.0)
LYMPHS PCT: 22 %
Lymphs Abs: 2.9 10*3/uL (ref 1.0–3.6)
MCH: 24.8 pg — ABNORMAL LOW (ref 26.0–34.0)
MCHC: 31.2 g/dL — ABNORMAL LOW (ref 32.0–36.0)
MCV: 79.5 fL — AB (ref 80.0–100.0)
MONO ABS: 0.8 10*3/uL (ref 0.2–1.0)
MONOS PCT: 6 %
NEUTROS ABS: 9.1 10*3/uL — AB (ref 1.4–6.5)
NEUTROS PCT: 66 %
Platelets: 348 10*3/uL (ref 150–440)
RBC: 5.49 MIL/uL (ref 4.40–5.90)
RDW: 15.1 % — AB (ref 11.5–14.5)
WBC: 13.6 10*3/uL — ABNORMAL HIGH (ref 3.8–10.6)

## 2015-10-27 LAB — TROPONIN I: Troponin I: <0.03 ng/mL (ref <0.03)

## 2015-10-27 LAB — BASIC METABOLIC PANEL
ANION GAP: 10 (ref 5–15)
BUN: 18 mg/dL (ref 6–20)
CALCIUM: 8.8 mg/dL — AB (ref 8.9–10.3)
CHLORIDE: 103 mmol/L (ref 101–111)
CO2: 26 mmol/L (ref 22–32)
Creatinine, Ser: 0.95 mg/dL (ref 0.61–1.24)
GFR calc Af Amer: >60 mL/min (ref >60)
GFR calc non Af Amer: >60 mL/min (ref >60)
GLUCOSE: 125 mg/dL — AB (ref 65–99)
Potassium: 3.3 mmol/L — ABNORMAL LOW (ref 3.5–5.1)
Sodium: 139 mmol/L (ref 135–145)

## 2015-10-27 MED ORDER — LISINOPRIL 20 MG PO TABS: 20.0000 mg | ORAL_TABLET | Freq: Every day | ORAL | 11 refills | Status: DC

## 2015-10-27 NOTE — Telephone Encounter (Signed)
Travis Cantrell notified as instructed by telephone.

## 2015-10-27 NOTE — ED Notes (Signed)
Spoke with lab tech, [place], informed lab tech blood specimen orders added to existing blood work. Lab tech verified to this RN they have blood specimen for patient.

## 2015-10-27 NOTE — ED Triage Notes (Addendum)
Pt from home, has home health nurse who reports pts BP has been elevated for about 2 days. Nurse has spoke with PCP and meds have been adjusted recently. Pt also reports he has a foley cath that has been leaking urine around tip of penis. Pt has MS and is bed confined.

## 2015-10-27 NOTE — ED Notes (Signed)
MD at bedside. 

## 2015-10-27 NOTE — Telephone Encounter (Signed)
Left message for Travis Cantrell to return my call.
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
## 2015-10-27 NOTE — Telephone Encounter (Signed)
Stop metoprolol as pulse is low.  Increase lisinopril to 20 mg daily.  Call before the weekend with his BPs and pulse, measured daily.  Sent in new RX

## 2015-10-27 NOTE — Telephone Encounter (Signed)
pts step daughter called because his BP is still high.  She wants to know if he needs his BP med changed.  They have been taking his BP as instructed during his visit last week.  Can you please contact her to advise. Thanks!

## 2015-10-28 DIAGNOSIS — N390 Urinary tract infection, site not specified: Secondary | ICD-10-CM | POA: Diagnosis not present

## 2015-10-28 DIAGNOSIS — Z7401 Bed confinement status: Secondary | ICD-10-CM | POA: Diagnosis not present

## 2015-10-28 MED ORDER — CEPHALEXIN 500 MG PO CAPS: 500.0000 mg | ORAL_CAPSULE | Freq: Three times a day (TID) | ORAL | 0 refills | Status: DC

## 2015-10-28 MED ORDER — CEFTRIAXONE SODIUM 1 G IJ SOLR
1.0000 g | Freq: Once | INTRAMUSCULAR | Status: AC
  Administered 2015-10-28: 1 g via INTRAVENOUS
  Filled 2015-10-28 (×2): qty 10

## 2015-10-28 NOTE — Discharge Instructions (Signed)
Please seek medical attention for any high fevers, chest pain, shortness of breath, change in behavior, persistent vomiting, bloody stool or any other new or concerning symptoms.  

## 2015-10-29 LAB — URINE CULTURE

## 2015-10-30 ENCOUNTER — Telehealth: Payer: Self-pay | Admitting: Family Medicine

## 2015-10-30 DIAGNOSIS — I10 Essential (primary) hypertension: Secondary | ICD-10-CM

## 2015-10-30 DIAGNOSIS — I499 Cardiac arrhythmia, unspecified: Secondary | ICD-10-CM

## 2015-10-30 NOTE — Telephone Encounter (Signed)
Referral sent 

## 2015-10-30 NOTE — Telephone Encounter (Signed)
Continue current meds for now given not very elevated at this time. Agree with referral to cardiologist if pt agreeable. Will need transportation. Let me know if I need to put in referral if not placed by ED.

## 2015-10-30 NOTE — Telephone Encounter (Signed)
Chelsea notified as instructed by telephone.  She would like you to put in the referral to the cardiologist.  She is requesting Dr. Lady Gary in [place].

## 2015-10-30 NOTE — Addendum Note (Signed)
Addended by: Kerby Nora E on: 10/30/2015 05:20 PM   Modules accepted: Orders

## 2015-10-30 NOTE — Telephone Encounter (Signed)
Eual FinesDonna - Chelsea is calling to discuss the pts bp meds - she reported that on Tuesday his BP was 234/119 and his HR was 50.  He went to the ED that night was DXd with a UTI. His BP has come down a little since then.  She said the ED stated he had an irregular heartbeat and should f/u with cardiology.  She stated that on Wed his BP was 151/98 HR 70 pre meds and 149/89 HR 68 post meds.  She wants to know if he should have a change in BP meds and if he needs a referal to cardiology.  Can you please call her .

## 2015-11-06 DIAGNOSIS — I10 Essential (primary) hypertension: Secondary | ICD-10-CM | POA: Diagnosis not present

## 2015-11-06 DIAGNOSIS — R002 Palpitations: Secondary | ICD-10-CM | POA: Diagnosis not present

## 2015-11-06 DIAGNOSIS — E7800 Pure hypercholesterolemia, unspecified: Secondary | ICD-10-CM | POA: Diagnosis not present

## 2015-11-06 DIAGNOSIS — R0602 Shortness of breath: Secondary | ICD-10-CM | POA: Diagnosis not present

## 2015-11-10 ENCOUNTER — Other Ambulatory Visit: Payer: Self-pay | Admitting: Family Medicine

## 2015-11-11 NOTE — Telephone Encounter (Signed)
Last refill 09/22/15 #30, last OV 10/23/15. Okay to refill?

## 2015-11-12 DIAGNOSIS — R002 Palpitations: Secondary | ICD-10-CM | POA: Diagnosis not present

## 2015-11-12 NOTE — Telephone Encounter (Signed)
[facility] [address] [phone].

## 2015-11-18 DIAGNOSIS — L8992 Pressure ulcer of unspecified site, stage 2: Secondary | ICD-10-CM | POA: Diagnosis not present

## 2015-11-18 DIAGNOSIS — J9690 Respiratory failure, unspecified, unspecified whether with hypoxia or hypercapnia: Secondary | ICD-10-CM | POA: Diagnosis not present

## 2015-11-24 ENCOUNTER — Ambulatory Visit: Payer: Medicare Other | Admitting: Family Medicine

## 2015-11-26 ENCOUNTER — Other Ambulatory Visit: Payer: Self-pay | Admitting: Family Medicine

## 2015-11-26 NOTE — Telephone Encounter (Signed)
Last office visit 10/23/2015.  Last refilled 10/09/2015 for #90 with no refills. Ok to refill?

## 2015-12-09 ENCOUNTER — Other Ambulatory Visit: Payer: Self-pay | Admitting: Family Medicine

## 2015-12-09 NOTE — Telephone Encounter (Signed)
Last office visit  10/23/2015.  Last refilled 11/12/2015 (diazepam) 08/26/2015 (gabapentin).

## 2015-12-10 NOTE — Telephone Encounter (Signed)
Diazepam called into [facility] [address] [place], [place] - [address] Phone: [phone]

## 2015-12-14 DIAGNOSIS — L89152 Pressure ulcer of sacral region, stage 2: Secondary | ICD-10-CM | POA: Diagnosis not present

## 2015-12-29 ENCOUNTER — Ambulatory Visit: Payer: Medicare Other | Admitting: Physical Therapy

## 2016-01-01 DIAGNOSIS — R0602 Shortness of breath: Secondary | ICD-10-CM | POA: Diagnosis not present

## 2016-01-04 ENCOUNTER — Encounter: Payer: Self-pay | Admitting: Emergency Medicine

## 2016-01-04 ENCOUNTER — Emergency Department
Admission: EM | Admit: 2016-01-04 | Discharge: 2016-01-04 | Disposition: A | Discharge status: Home / Self Care | Payer: Medicare Other | Attending: Emergency Medicine | Admitting: Emergency Medicine

## 2016-01-04 DIAGNOSIS — Z79899 Other long term (current) drug therapy: Secondary | ICD-10-CM | POA: Insufficient documentation

## 2016-01-04 DIAGNOSIS — T85848A Pain due to other internal prosthetic devices, implants and grafts, initial encounter: Secondary | ICD-10-CM

## 2016-01-04 DIAGNOSIS — T8484XA Pain due to internal orthopedic prosthetic devices, implants and grafts, initial encounter: Secondary | ICD-10-CM | POA: Diagnosis not present

## 2016-01-04 DIAGNOSIS — M2763 Post-osseointegration mechanical failure of dental implant: Secondary | ICD-10-CM | POA: Diagnosis not present

## 2016-01-04 DIAGNOSIS — K0889 Other specified disorders of teeth and supporting structures: Secondary | ICD-10-CM | POA: Diagnosis not present

## 2016-01-04 DIAGNOSIS — R531 Weakness: Secondary | ICD-10-CM | POA: Diagnosis not present

## 2016-01-04 DIAGNOSIS — N390 Urinary tract infection, site not specified: Secondary | ICD-10-CM | POA: Insufficient documentation

## 2016-01-04 DIAGNOSIS — Y69 Unspecified misadventure during surgical and medical care: Secondary | ICD-10-CM | POA: Diagnosis not present

## 2016-01-04 DIAGNOSIS — I10 Essential (primary) hypertension: Secondary | ICD-10-CM | POA: Insufficient documentation

## 2016-01-04 LAB — URINALYSIS, COMPLETE (UACMP) WITH MICROSCOPIC
BILIRUBIN URINE: NEGATIVE
Glucose, UA: NEGATIVE mg/dL
Ketones, ur: NEGATIVE mg/dL
Nitrite: POSITIVE — AB
PROTEIN: 30 mg/dL — AB
SPECIFIC GRAVITY, URINE: 1.018 (ref 1.005–1.030)
SQUAMOUS EPITHELIAL / LPF: NONE SEEN
pH: 5 (ref 5.0–8.0)

## 2016-01-04 MED ORDER — PENICILLIN V POTASSIUM 500 MG PO TABS: 500.0000 mg | ORAL_TABLET | Freq: Four times a day (QID) | ORAL | 0 refills | Status: DC

## 2016-01-04 MED ORDER — NITROFURANTOIN MONOHYD MACRO 100 MG PO CAPS: 100.0000 mg | ORAL_CAPSULE | Freq: Two times a day (BID) | ORAL | 0 refills | Status: DC

## 2016-01-04 MED ORDER — NITROFURANTOIN MONOHYD MACRO 100 MG PO CAPS
100.0000 mg | ORAL_CAPSULE | Freq: Once | ORAL | Status: AC
  Administered 2016-01-04: 100 mg via ORAL
  Filled 2016-01-04: qty 1

## 2016-01-04 MED ORDER — NITROFURANTOIN MACROCRYSTAL 100 MG PO CAPS: 100.0000 mg | ORAL_CAPSULE | Freq: Two times a day (BID) | ORAL | 0 refills | Status: DC

## 2016-01-04 MED ORDER — PENICILLIN V POTASSIUM 250 MG PO TABS
500.0000 mg | ORAL_TABLET | Freq: Once | ORAL | Status: AC
  Administered 2016-01-04: 500 mg via ORAL
  Filled 2016-01-04: qty 2
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
## 2016-01-04 NOTE — ED Notes (Signed)
Received call from [facility], they would not transport, they told me to call [facility].  I called [facility] and they informed me they wouldn't come get patient, and to call [employer], I called Investment banker, corporate at [employer] [employer].  They informed me it would be an hour or so until they could get the patient.

## 2016-01-04 NOTE — ED Triage Notes (Signed)
Patient comes to ED via EMS from home with tooth pain that worsened this morning and dark urine. Patient has MS and chronic foley.

## 2016-01-04 NOTE — ED Notes (Signed)
CALLED ems FOR TRANSPORT [phone]

## 2016-01-05 LAB — URINE CULTURE

## 2016-01-06 ENCOUNTER — Emergency Department (HOSPITAL_COMMUNITY)
Admission: EM | Admit: 2016-01-06 | Discharge: 2016-01-07 | Disposition: A | Discharge status: Home / Self Care | Payer: Medicare Other | Attending: Emergency Medicine | Admitting: Emergency Medicine

## 2016-01-06 ENCOUNTER — Encounter (HOSPITAL_COMMUNITY): Payer: Self-pay

## 2016-01-06 ENCOUNTER — Telehealth: Payer: Self-pay | Admitting: Family Medicine

## 2016-01-06 DIAGNOSIS — Z79899 Other long term (current) drug therapy: Secondary | ICD-10-CM | POA: Diagnosis not present

## 2016-01-06 DIAGNOSIS — I10 Essential (primary) hypertension: Secondary | ICD-10-CM | POA: Diagnosis not present

## 2016-01-06 DIAGNOSIS — K0889 Other specified disorders of teeth and supporting structures: Secondary | ICD-10-CM | POA: Insufficient documentation

## 2016-01-06 DIAGNOSIS — G4489 Other headache syndrome: Secondary | ICD-10-CM | POA: Diagnosis not present

## 2016-01-06 DIAGNOSIS — R030 Elevated blood-pressure reading, without diagnosis of hypertension: Secondary | ICD-10-CM | POA: Diagnosis not present

## 2016-01-06 DIAGNOSIS — Z7982 Long term (current) use of aspirin: Secondary | ICD-10-CM | POA: Insufficient documentation

## 2016-01-06 DIAGNOSIS — R6884 Jaw pain: Secondary | ICD-10-CM | POA: Diagnosis not present

## 2016-01-06 MED ORDER — HYDROCODONE-ACETAMINOPHEN 5-325 MG PO TABS
1.0000 | ORAL_TABLET | Freq: Once | ORAL | Status: AC
  Administered 2016-01-06: 1 via ORAL
  Filled 2016-01-06: qty 1

## 2016-01-06 MED ORDER — HYDROCODONE-ACETAMINOPHEN 5-325 MG PO TABS: ORAL_TABLET | ORAL | 0 refills | Status: DC

## 2016-01-06 NOTE — ED Notes (Signed)
Bed: WA19 Expected date:  Expected time:  Means of arrival:  Comments: 

## 2016-01-06 NOTE — Discharge Instructions (Signed)
It is very important to make an appointment with the dentist as soon as possible.  Do not hesitate to return to the emergency department for any new, worsening or concerning symptoms.

## 2016-01-06 NOTE — ED Triage Notes (Signed)
He c/o left-sided h/a x 1 week, centered at left temple. PEARL at 62mm, and he is oriented x 4 with clear speech. He has extremely limited mobility of bilat. Lower and right upper extremity. He has enough preserved rudimentary function of his left upper extremity to utilize his smart phone. He denies fever/n/v/d and is in no distress and had no other specific c/o at this time.

## 2016-01-06 NOTE — ED Notes (Signed)
Bed: WHALD Expected date:  Expected time:  Means of arrival:  Comments: 

## 2016-01-06 NOTE — ED Notes (Signed)
Pts wife notified of Pt discharge.
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
## 2016-01-06 NOTE — ED Triage Notes (Addendum)
Per EMS, pt from home.  Pt dx with implant pain at [facility] on 12/11.  Pain continues with headache, jaw pain and dental pain.  Pt given abx on last visit.  Pain continues.  Pt is non ambulatory.  Alert and oriented.  Vitals: 168/108, 58, resp 18

## 2016-01-06 NOTE — ED Notes (Signed)
[facility] notified for Pt transportation 
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
## 2016-01-06 NOTE — ED Notes (Signed)
RN spoke to Pts wife, Jerrye BushyLora Raval by phone with Pts permission.  She can be reached at [phone].  She will nto be able to pick Pt up at DC.  He will need [facility]

## 2016-01-07 NOTE — Telephone Encounter (Signed)
Agree with ED dispo for severe headache, no history of migraine, causing pt to moan.

## 2016-01-08 ENCOUNTER — Other Ambulatory Visit: Payer: Self-pay | Admitting: Family Medicine

## 2016-01-08 DIAGNOSIS — E7800 Pure hypercholesterolemia, unspecified: Secondary | ICD-10-CM | POA: Diagnosis not present

## 2016-01-08 DIAGNOSIS — R0602 Shortness of breath: Secondary | ICD-10-CM | POA: Diagnosis not present

## 2016-01-08 DIAGNOSIS — G35 Multiple sclerosis: Secondary | ICD-10-CM | POA: Diagnosis not present

## 2016-01-08 DIAGNOSIS — I10 Essential (primary) hypertension: Secondary | ICD-10-CM | POA: Diagnosis not present

## 2016-01-08 NOTE — Telephone Encounter (Signed)
Last office visit 10/23/2015.  Last refilled 11/26/2015 for #90 with no refills.  Ok to refill?

## 2016-01-13 DIAGNOSIS — L89152 Pressure ulcer of sacral region, stage 2: Secondary | ICD-10-CM | POA: Diagnosis not present

## 2016-01-14 DIAGNOSIS — L89152 Pressure ulcer of sacral region, stage 2: Secondary | ICD-10-CM | POA: Diagnosis not present

## 2016-01-16 ENCOUNTER — Emergency Department: Payer: Medicare Other

## 2016-01-16 ENCOUNTER — Encounter: Payer: Self-pay | Admitting: Emergency Medicine

## 2016-01-16 ENCOUNTER — Emergency Department
Admission: EM | Admit: 2016-01-16 | Discharge: 2016-01-16 | Disposition: A | Discharge status: Home / Self Care | Payer: Medicare Other | Attending: Emergency Medicine | Admitting: Emergency Medicine

## 2016-01-16 DIAGNOSIS — Z960 Presence of urogenital implants: Secondary | ICD-10-CM | POA: Insufficient documentation

## 2016-01-16 DIAGNOSIS — G43901 Migraine, unspecified, not intractable, with status migrainosus: Secondary | ICD-10-CM | POA: Diagnosis not present

## 2016-01-16 DIAGNOSIS — Z791 Long term (current) use of non-steroidal anti-inflammatories (NSAID): Secondary | ICD-10-CM | POA: Insufficient documentation

## 2016-01-16 DIAGNOSIS — Z7982 Long term (current) use of aspirin: Secondary | ICD-10-CM | POA: Insufficient documentation

## 2016-01-16 DIAGNOSIS — Z79899 Other long term (current) drug therapy: Secondary | ICD-10-CM | POA: Diagnosis not present

## 2016-01-16 DIAGNOSIS — R51 Headache: Secondary | ICD-10-CM | POA: Diagnosis not present

## 2016-01-16 DIAGNOSIS — I10 Essential (primary) hypertension: Secondary | ICD-10-CM | POA: Insufficient documentation

## 2016-01-16 DIAGNOSIS — N3090 Cystitis, unspecified without hematuria: Secondary | ICD-10-CM | POA: Insufficient documentation

## 2016-01-16 DIAGNOSIS — Z978 Presence of other specified devices: Secondary | ICD-10-CM

## 2016-01-16 DIAGNOSIS — R404 Transient alteration of awareness: Secondary | ICD-10-CM | POA: Diagnosis not present

## 2016-01-16 DIAGNOSIS — G43909 Migraine, unspecified, not intractable, without status migrainosus: Secondary | ICD-10-CM | POA: Diagnosis not present

## 2016-01-16 DIAGNOSIS — R531 Weakness: Secondary | ICD-10-CM | POA: Diagnosis not present

## 2016-01-16 DIAGNOSIS — G43001 Migraine without aura, not intractable, with status migrainosus: Secondary | ICD-10-CM | POA: Diagnosis not present

## 2016-01-16 LAB — URINALYSIS, COMPLETE (UACMP) WITH MICROSCOPIC
Bilirubin Urine: NEGATIVE
Glucose, UA: NEGATIVE mg/dL
Ketones, ur: 20 mg/dL — AB
NITRITE: POSITIVE — AB
SPECIFIC GRAVITY, URINE: 1.028 (ref 1.005–1.030)
SQUAMOUS EPITHELIAL / LPF: NONE SEEN
pH: 5 (ref 5.0–8.0)

## 2016-01-16 LAB — BASIC METABOLIC PANEL
Anion gap: 8 (ref 5–15)
BUN: 29 mg/dL — AB (ref 6–20)
CALCIUM: 9.2 mg/dL (ref 8.9–10.3)
CO2: 26 mmol/L (ref 22–32)
Chloride: 107 mmol/L (ref 101–111)
Creatinine, Ser: 0.88 mg/dL (ref 0.61–1.24)
GFR calc Af Amer: >60 mL/min (ref >60)
GLUCOSE: 109 mg/dL — AB (ref 65–99)
Potassium: 3.8 mmol/L (ref 3.5–5.1)
Sodium: 141 mmol/L (ref 135–145)

## 2016-01-16 LAB — CBC WITH DIFFERENTIAL/PLATELET
BASOS ABS: 0.1 10*3/uL (ref 0–0.1)
BASOS PCT: 1 %
EOS PCT: 2 %
Eosinophils Absolute: 0.2 10*3/uL (ref 0–0.7)
HEMATOCRIT: 43 % (ref 40.0–52.0)
Hemoglobin: 13.9 g/dL (ref 13.0–18.0)
Lymphocytes Relative: 14 %
Lymphs Abs: 1.7 10*3/uL (ref 1.0–3.6)
MCH: 25.6 pg — ABNORMAL LOW (ref 26.0–34.0)
MCHC: 32.3 g/dL (ref 32.0–36.0)
MCV: 79.5 fL — AB (ref 80.0–100.0)
MONO ABS: 0.7 10*3/uL (ref 0.2–1.0)
MONOS PCT: 6 %
Neutro Abs: 9.8 10*3/uL — ABNORMAL HIGH (ref 1.4–6.5)
Neutrophils Relative %: 77 %
PLATELETS: 340 10*3/uL (ref 150–440)
RBC: 5.4 MIL/uL (ref 4.40–5.90)
RDW: 17 % — AB (ref 11.5–14.5)
WBC: 12.6 10*3/uL — ABNORMAL HIGH (ref 3.8–10.6)

## 2016-01-16 MED ORDER — DIPHENHYDRAMINE HCL 50 MG/ML IJ SOLN
25.0000 mg | Freq: Once | INTRAMUSCULAR | Status: AC
  Administered 2016-01-16: 25 mg via INTRAVENOUS
  Filled 2016-01-16: qty 1

## 2016-01-16 MED ORDER — DIPHENHYDRAMINE HCL 25 MG PO CAPS: 50.0000 mg | ORAL_CAPSULE | Freq: Four times a day (QID) | ORAL | 0 refills | Status: DC | PRN

## 2016-01-16 MED ORDER — METOCLOPRAMIDE HCL 10 MG PO TABS: 10.0000 mg | ORAL_TABLET | Freq: Four times a day (QID) | ORAL | 0 refills | Status: DC | PRN

## 2016-01-16 MED ORDER — CEFTRIAXONE SODIUM-DEXTROSE 1-3.74 GM-% IV SOLR
1.0000 g | Freq: Once | INTRAVENOUS | Status: AC
  Administered 2016-01-16: 1 g via INTRAVENOUS
  Filled 2016-01-16: qty 50

## 2016-01-16 MED ORDER — KETOROLAC TROMETHAMINE 30 MG/ML IJ SOLN
15.0000 mg | INTRAMUSCULAR | Status: AC
  Administered 2016-01-16: 15 mg via INTRAVENOUS
  Filled 2016-01-16: qty 1

## 2016-01-16 MED ORDER — CEFDINIR 300 MG PO CAPS: 300.0000 mg | ORAL_CAPSULE | Freq: Two times a day (BID) | ORAL | 0 refills | Status: DC

## 2016-01-16 MED ORDER — METOCLOPRAMIDE HCL 5 MG/ML IJ SOLN
10.0000 mg | Freq: Once | INTRAMUSCULAR | Status: AC
  Administered 2016-01-16: 10 mg via INTRAVENOUS
  Filled 2016-01-16: qty 2

## 2016-01-16 MED ORDER — DEXTROSE 5 % IV SOLN: 1.0000 g | Freq: Once | INTRAVENOUS | Status: DC

## 2016-01-16 NOTE — ED Notes (Addendum)
EMS reports they will not be able to get pt until after 20:00 tonight due to multiple people needed transportation before pt.

## 2016-01-16 NOTE — ED Notes (Addendum)
Attempted to call pts family to confirm EMS transfer. Unable to reach Travis Cantrell or Travis Cantrell who are listed as daughters and emergency contact in computer. RN able to reach Santo Heldiane Cantrell who is the pts sister who reports pt needs to be transferred home via EMS. RN informed Travis she would call her back after EMS arrival to ED in order for family to arrange for someone to be home when pt arrived.
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
## 2016-01-16 NOTE — ED Notes (Signed)
RN called family to update them on the time delay for transportation. Family verbalized understanding.

## 2016-01-16 NOTE — ED Triage Notes (Signed)
Patient from home with c/o of migraine. Patient has hx of MS and migraine

## 2016-01-16 NOTE — ED Notes (Signed)
Patient transported to CT 

## 2016-01-16 NOTE — ED Notes (Signed)
[facility] arrived to transport pt home. Family made aware. PT aware of dx and paperwork sent with patient. Pt unable to sign discharge but verbalized consent and understanding.

## 2016-01-20 LAB — URINE CULTURE: Culture: >=100000 — AB

## 2016-01-21 NOTE — Progress Notes (Signed)
ED Antimicrobial Stewardship Positive Culture Follow Up   Travis Cantrell is an 55 y.o. male [facility] with a chief complaint of  Chief Complaint  Patient presents with  . Migraine    Recent Results (from the past 720 hour(s))  Urine culture     Status: Abnormal   Collection Time: 01/04/16  2:14 PM  Result Value Ref Range Status   Specimen Description URINE, RANDOM  Final   Special Requests NONE  Final   Culture MULTIPLE SPECIES PRESENT, SUGGEST RECOLLECTION (A)  Final   Report Status 01/05/2016 FINAL  Final  Urine culture     Status: Abnormal   Collection Time: 01/16/16 11:30 AM  Result Value Ref Range Status   Specimen Description URINE, CATHETERIZED  Final   Special Requests NONE  Final   Culture (A)  Final    >=100,000 COLONIES/mL KLEBSIELLA PNEUMONIAE 50,000 COLONIES/mL PSEUDOMONAS AERUGINOSA    Report Status 01/20/2016 FINAL  Final   Organism ID, Bacteria KLEBSIELLA PNEUMONIAE (A)  Final   Organism ID, Bacteria PSEUDOMONAS AERUGINOSA (A)  Final      Susceptibility   Klebsiella pneumoniae - MIC*    AMPICILLIN >=32 RESISTANT Resistant     CEFAZOLIN <=4 SENSITIVE Sensitive     CEFTRIAXONE <=1 SENSITIVE Sensitive     CIPROFLOXACIN <=0.25 SENSITIVE Sensitive     GENTAMICIN <=1 SENSITIVE Sensitive     IMIPENEM <=0.25 SENSITIVE Sensitive     NITROFURANTOIN 64 INTERMEDIATE Intermediate     TRIMETH/SULFA <=20 SENSITIVE Sensitive     AMPICILLIN/SULBACTAM 4 SENSITIVE Sensitive     PIP/TAZO <=4 SENSITIVE Sensitive     Extended ESBL NEGATIVE Sensitive     * >=100,000 COLONIES/mL KLEBSIELLA PNEUMONIAE   Pseudomonas aeruginosa - MIC*    CEFTAZIDIME 4 SENSITIVE Sensitive     CIPROFLOXACIN 0.5 SENSITIVE Sensitive     GENTAMICIN 2 SENSITIVE Sensitive     IMIPENEM 2 SENSITIVE Sensitive     PIP/TAZO 16 SENSITIVE Sensitive     CEFEPIME 8 SENSITIVE Sensitive     * 50,000 COLONIES/mL PSEUDOMONAS AERUGINOSA    [x]  Treated with cefdinir, organism  resistant to prescribed antimicrobial []  Patient discharged originally without antimicrobial agent and treatment is now indicated  New antibiotic prescription: Ciprofloxacin 500mg  PO BID x 7days  ED Provider: Everlean CherryGoodman   Julyana Woolverton, PharmD, BCPS Clinical Pharmacist  01/21/2016 3:31 PM

## 2016-01-26 ENCOUNTER — Encounter (HOSPITAL_COMMUNITY): Payer: Self-pay

## 2016-01-26 ENCOUNTER — Emergency Department (HOSPITAL_COMMUNITY): Payer: Medicare Other

## 2016-01-26 ENCOUNTER — Emergency Department (HOSPITAL_COMMUNITY)
Admission: EM | Admit: 2016-01-26 | Discharge: 2016-01-27 | Disposition: A | Discharge status: Home / Self Care | Payer: Medicare Other | Attending: Emergency Medicine | Admitting: Emergency Medicine

## 2016-01-26 DIAGNOSIS — R519 Headache, unspecified: Secondary | ICD-10-CM

## 2016-01-26 DIAGNOSIS — Z7982 Long term (current) use of aspirin: Secondary | ICD-10-CM | POA: Insufficient documentation

## 2016-01-26 DIAGNOSIS — Z79899 Other long term (current) drug therapy: Secondary | ICD-10-CM | POA: Diagnosis not present

## 2016-01-26 DIAGNOSIS — G4489 Other headache syndrome: Secondary | ICD-10-CM | POA: Diagnosis not present

## 2016-01-26 DIAGNOSIS — N390 Urinary tract infection, site not specified: Secondary | ICD-10-CM

## 2016-01-26 DIAGNOSIS — G43909 Migraine, unspecified, not intractable, without status migrainosus: Secondary | ICD-10-CM | POA: Diagnosis not present

## 2016-01-26 DIAGNOSIS — R51 Headache: Secondary | ICD-10-CM | POA: Insufficient documentation

## 2016-01-26 DIAGNOSIS — I10 Essential (primary) hypertension: Secondary | ICD-10-CM | POA: Diagnosis not present

## 2016-01-26 LAB — URINALYSIS, ROUTINE W REFLEX MICROSCOPIC
Bilirubin Urine: NEGATIVE
Glucose, UA: NEGATIVE mg/dL
Ketones, ur: 5 mg/dL — AB
Nitrite: POSITIVE — AB
PH: 5 (ref 5.0–8.0)
Protein, ur: 100 mg/dL — AB
Specific Gravity, Urine: 1.019 (ref 1.005–1.030)

## 2016-01-26 MED ORDER — METOCLOPRAMIDE HCL 5 MG/ML IJ SOLN
10.0000 mg | Freq: Once | INTRAMUSCULAR | Status: AC
Start: 2016-01-26 — End: 2016-01-26
  Administered 2016-01-26: 10 mg via INTRAVENOUS
  Filled 2016-01-26: qty 2

## 2016-01-26 MED ORDER — CIPROFLOXACIN HCL 500 MG PO TABS: 500.0000 mg | ORAL_TABLET | Freq: Two times a day (BID) | ORAL | 0 refills | Status: DC

## 2016-01-26 MED ORDER — DIPHENHYDRAMINE HCL 50 MG/ML IJ SOLN
25.0000 mg | Freq: Once | INTRAMUSCULAR | Status: AC
  Administered 2016-01-26: 25 mg via INTRAVENOUS
  Filled 2016-01-26: qty 1

## 2016-01-26 MED ORDER — SODIUM CHLORIDE 0.9 % IV BOLUS (SEPSIS)
1000.0000 mL | Freq: Once | INTRAVENOUS | Status: AC
  Administered 2016-01-26: 1000 mL via INTRAVENOUS

## 2016-01-26 MED ORDER — KETOROLAC TROMETHAMINE 30 MG/ML IJ SOLN
30.0000 mg | Freq: Once | INTRAMUSCULAR | Status: AC
  Administered 2016-01-26: 30 mg via INTRAVENOUS
  Filled 2016-01-26: qty 1

## 2016-01-26 MED ORDER — CIPROFLOXACIN IN D5W 200 MG/100ML IV SOLN
200.0000 mg | Freq: Once | INTRAVENOUS | Status: AC
  Administered 2016-01-26: 200 mg via INTRAVENOUS
  Filled 2016-01-26: qty 100

## 2016-01-26 NOTE — ED Provider Notes (Signed)
MRI shows that patient has audible sclerosis.  There is not a significant change from the last scan of 2007. Patient is currently being treated for UTI with Keflex since he still positive for nitrates the antibiotic has been changed to Cipro.  Patient has been discharged home with follow-up with his primary care physician   Earley Favor, NP 01/26/16 [id number]    Vanetta Mulders, MD 01/30/16 0730

## 2016-01-26 NOTE — Discharge Instructions (Signed)
The MRI of your brain shows that you do have multiple sclerosis.  It has not significantly changed since her last scan. Your urine shows that you do have a urinary tract infection, antibiotic has been changed to Cipro.  Please stop taking your Keflex.  Make an appointment with your primary care physician in 1-2 weeks for test of cure

## 2016-01-26 NOTE — ED Triage Notes (Signed)
Pt arrived via [facility] from home c/o migraine x3 weeks with light sensitivity, evaluated at [facility] and [place] prior.  Pt has existing foley, denies N/V, home health sees, Hx MS only has limited movement in left arm, HTN.
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
## 2016-01-27 ENCOUNTER — Ambulatory Visit: Payer: Medicare Other | Admitting: Family Medicine

## 2016-01-27 DIAGNOSIS — G43009 Migraine without aura, not intractable, without status migrainosus: Secondary | ICD-10-CM | POA: Diagnosis not present

## 2016-01-27 DIAGNOSIS — N390 Urinary tract infection, site not specified: Secondary | ICD-10-CM | POA: Diagnosis not present

## 2016-01-27 NOTE — ED Notes (Signed)
PTAR called at [phone]

## 2016-01-27 NOTE — ED Notes (Addendum)
Pt stable, states understanding of discharge instructions, pt spoke with wife on the phone, will take PTAR home.

## 2016-01-30 LAB — URINE CULTURE

## 2016-01-31 ENCOUNTER — Telehealth: Payer: Self-pay

## 2016-01-31 NOTE — Telephone Encounter (Signed)
Post ED Visit - Positive Culture Follow-up  Culture report reviewed by antimicrobial stewardship pharmacist:  []  Enzo Bi, Pharm.D. []  Celedonio Miyamoto, Pharm.D., BCPS []  Garvin Fila, Pharm.D. []  Georgina Pillion, Pharm.D., BCPS []  [place], [address].D., BCPS, AAHIVP []  Estella Husk, Pharm.D., BCPS, AAHIVP []  Tennis Must, Pharm.D. []  Sherle Poe, [address].D. Rachel Rumbarger Pharm D Positive urine culture Treated with Ciprofloxacin, organism sensitive to the same and no further patient follow-up is required at this time.  Jerry Caras 01/31/2016, 10:29 AM

## 2016-02-01 ENCOUNTER — Ambulatory Visit: Payer: Medicare Other | Admitting: Physical Therapy

## 2016-02-05 ENCOUNTER — Encounter: Payer: Self-pay | Admitting: Family Medicine

## 2016-02-05 ENCOUNTER — Ambulatory Visit (INDEPENDENT_AMBULATORY_CARE_PROVIDER_SITE_OTHER): Payer: Medicare Other | Admitting: Family Medicine

## 2016-02-05 ENCOUNTER — Telehealth: Payer: Self-pay

## 2016-02-05 VITALS — BP 142/107 | HR 109 | Temp 98.6°F

## 2016-02-05 DIAGNOSIS — R519 Headache, unspecified: Secondary | ICD-10-CM | POA: Insufficient documentation

## 2016-02-05 DIAGNOSIS — R51 Headache: Secondary | ICD-10-CM

## 2016-02-05 DIAGNOSIS — H1013 Acute atopic conjunctivitis, bilateral: Secondary | ICD-10-CM

## 2016-02-05 DIAGNOSIS — N390 Urinary tract infection, site not specified: Secondary | ICD-10-CM

## 2016-02-05 DIAGNOSIS — T83511D Infection and inflammatory reaction due to indwelling urethral catheter, subsequent encounter: Secondary | ICD-10-CM

## 2016-02-05 MED ORDER — CARBAMAZEPINE 200 MG PO TABS: 200.0000 mg | ORAL_TABLET | Freq: Two times a day (BID) | ORAL | 3 refills | Status: DC

## 2016-02-05 MED ORDER — OLOPATADINE HCL 0.1 % OP SOLN: 1.0000 [drp] | Freq: Two times a day (BID) | OPHTHALMIC | 12 refills | Status: DC

## 2016-02-05 NOTE — Assessment & Plan Note (Signed)
Start patanol drops bilaterally.

## 2016-02-05 NOTE — Telephone Encounter (Signed)
[name] left v/m;pt has appt to see Dr Ermalene Searing today at 11:45. Chelsea was supposed to come to appt but Travis Cantrell will not be able to make appt. Pt has recently been seen several times in [facility] ED, [facility] ED and [facility] ED.Chelsea said pt has been having pain in lt side of face and pain also in jaw that goes up into pts head and Travis Cantrell has narrowed down to trigeminal neuralgia. Chelsea said he lays in bed in a lot of pain. Pt has also had UTIs when in EDs. Chelsea said to call her if needed at [phone].

## 2016-02-05 NOTE — Patient Instructions (Addendum)
Start miralax daily for constipation.  Can try eye drops for allergic conjunctivitis.  Start Carbamezapine 200 mg twice daily for face pain likely trigeminal neuralgia. Call if not improving pain.  Keep appt with neurology.   Trigeminal Neuralgia Trigeminal neuralgia is a nerve disorder that causes attacks of severe facial pain. The attacks last from a few seconds to several minutes. They can happen for days, weeks, or months and then go away for months or years. Trigeminal neuralgia is also called tic douloureux. What are the causes? This condition is caused by damage to a nerve in the face that is called the trigeminal nerve. An attack can be triggered by:  Talking.  Chewing.  Putting on makeup.  Washing your face.  Shaving your face.  Brushing your teeth.  Touching your face. What increases the risk? This condition is more likely to develop in:  Women.  People who are 51 years of age or older. What are the signs or symptoms? The main symptom of this condition is pain in the jaw, lips, eyes, nose, scalp, forehead, and face. The pain may be intense, stabbing, electric, or shock-like. How is this diagnosed? This condition is diagnosed with a physical exam. A CT scan or MRI may be done to rule out other conditions that can cause facial pain. How is this treated? This condition may be treated with:  Avoiding the things that trigger your attacks.  Pain medicine.  Surgery. This may be done in severe cases if other medical treatment does not provide relief. Follow these instructions at home:  Take over-the-counter and prescription medicines only as told by your health care provider.  If you wish to get pregnant, talk with your health care provider before you start trying to get pregnant.  Avoid the things that trigger your attacks. It may help to:  Chew on the unaffected side of your mouth.  Avoid touching your face.  Avoid blasts of hot or cold air. Contact a  health care provider if:  Your pain medicine is not helping.  You develop new, unexplained symptoms, such as:  Double vision.  Facial weakness.  Changes in hearing or balance.  You become pregnant. Get help right away if:  Your pain is unbearable, and your pain medicine does not help. This information is not intended to replace advice given to you by your health care provider. Make sure you discuss any questions you have with your health care provider. Document Released: 01/08/2000 Document Revised: 09/13/2015 Document Reviewed: 05/05/2014 Elsevier Interactive Patient Education  2017 [employer].

## 2016-02-05 NOTE — Telephone Encounter (Signed)
Noted  

## 2016-02-05 NOTE — Assessment & Plan Note (Signed)
Symptoms currently resolved after cipro course.

## 2016-02-05 NOTE — Assessment & Plan Note (Signed)
Most consistent with trigeminal neuralgia which can be more common in MS.  Will start trial of Carbamezapine and encourage him to follow up with neurology.

## 2016-02-05 NOTE — Progress Notes (Signed)
   Subjective:    Patient ID: Travis Cantrell, male    DOB: 02-Mar-1960, 56 y.o.   MRN: [id number]  HPI  56 year old male with MS wheel chair bound presents for follow up ER visit 01/26/2016 for severe headache, 3 weeks left sided.. Seen in mid Dec at [facility] for headache: He was sent home with rx for reglan and benadryl which have not been adequately controlling pain. + photophobia, no visual changes.  returned to ER 1/2 with continued headache. Had MRI in ER .Marland Kitchen No sig change from 2007 UTI initial treatment with Keflex changed to cipro. Home health   Today: Continued left side of face.[place] down to mouth.  Severe 10/10. Slightly better than initially. Pain feels like a throbbing,  electric shock like pain, sharp pain. Sensitive to light, not sound. Worse in cold weather. No  New numbness, no new weakness. This feels different than migraine he has had in the past.  Not sure when next  appt with neuro is.. He has not seen them lately.  Urine at this point is clear and minimal odor. No fever. Some constipation off and on... BM 2 days ago, small amount. Using stool softners.  Occ dry and watery and itchy eyes x 1 -2 weeks. No redness. No nasal congestion., no sneezing.  No fever   Has appt with neuro in  Next few weeks. Dr. Clarene Duke  Hx of migraines Review of Systems  Constitutional: Negative for fatigue and fever.  HENT: Negative for ear pain.   Eyes: Positive for discharge and redness.  Respiratory: Negative for chest tightness and shortness of breath.   Cardiovascular: Negative for chest pain.  Gastrointestinal: Negative for abdominal distention.     Blood pressure (!) 142/107, pulse (!) 109, temperature 98.6 F (37 C), temperature source Oral.      Objective:   Physical Exam  Constitutional: Vital signs are normal. He appears well-developed and well-nourished.  unkempt  HENT:  Head: Normocephalic.  Right Ear: Hearing normal.  Left Ear: Hearing normal.  Nose: Nose  normal.  Mouth/Throat: Oropharynx is clear and moist and mucous membranes are normal.  Eyes: Right eye exhibits no exudate. Right conjunctiva is injected. Left conjunctiva is injected.  Neck: Trachea normal. Carotid bruit is not present. No thyroid mass and no thyromegaly present.  Cardiovascular: Normal rate, regular rhythm and normal pulses.  Exam reveals no gallop, no distant heart sounds and no friction rub.   No murmur heard. No peripheral edema  Pulmonary/Chest: Effort normal and breath sounds normal. No respiratory distress.  Neurological: He displays atrophy. He exhibits abnormal muscle tone.   Pain in trigeminal nerve distribution and not otherwise  no numbness in face.  In wheelchair.  Skin: Skin is warm, dry and intact. No rash noted.  Psychiatric: He has a normal mood and affect. His speech is normal and behavior is normal. Thought content normal.          Assessment & Plan:

## 2016-02-05 NOTE — Progress Notes (Signed)
Pre visit review using our clinic review tool, if applicable. No additional management support is needed unless otherwise documented below in the visit note. 

## 2016-02-16 ENCOUNTER — Emergency Department
Admission: EM | Admit: 2016-02-16 | Discharge: 2016-02-16 | Disposition: A | Discharge status: Home / Self Care | Payer: Medicare Other | Attending: Emergency Medicine | Admitting: Emergency Medicine

## 2016-02-16 ENCOUNTER — Encounter: Payer: Self-pay | Admitting: *Deleted

## 2016-02-16 DIAGNOSIS — I10 Essential (primary) hypertension: Secondary | ICD-10-CM | POA: Diagnosis not present

## 2016-02-16 DIAGNOSIS — Z960 Presence of urogenital implants: Secondary | ICD-10-CM | POA: Diagnosis not present

## 2016-02-16 DIAGNOSIS — Z7401 Bed confinement status: Secondary | ICD-10-CM | POA: Diagnosis not present

## 2016-02-16 DIAGNOSIS — T83038A Leakage of other indwelling urethral catheter, initial encounter: Secondary | ICD-10-CM | POA: Insufficient documentation

## 2016-02-16 DIAGNOSIS — Y733 Surgical instruments, materials and gastroenterology and urology devices (including sutures) associated with adverse incidents: Secondary | ICD-10-CM | POA: Insufficient documentation

## 2016-02-16 DIAGNOSIS — T839XXA Unspecified complication of genitourinary prosthetic device, implant and graft, initial encounter: Secondary | ICD-10-CM

## 2016-02-16 DIAGNOSIS — T83098A Other mechanical complication of other indwelling urethral catheter, initial encounter: Secondary | ICD-10-CM | POA: Diagnosis not present

## 2016-02-16 DIAGNOSIS — R6889 Other general symptoms and signs: Secondary | ICD-10-CM | POA: Diagnosis not present

## 2016-02-16 DIAGNOSIS — T849XXA Unspecified complication of internal orthopedic prosthetic device, implant and graft, initial encounter: Secondary | ICD-10-CM | POA: Diagnosis not present

## 2016-02-16 NOTE — ED Notes (Signed)
EMS arrived to transport pt.

## 2016-02-16 NOTE — ED Notes (Signed)
EMS transport needed.

## 2016-02-16 NOTE — ED Notes (Signed)
Cleaned and changed bed linens. Cleaned catheter site.

## 2016-02-16 NOTE — ED Triage Notes (Signed)
Pt arrives via EMS from Home, pt states he had a catheter replaced ([address] [place]) a few days ago and is leaking urine around catheter, arrives soaking wet, EDP at bedside, hx of MS
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
## 2016-02-16 NOTE — ED Notes (Signed)
[employer] called for 18 french foley

## 2016-02-18 ENCOUNTER — Telehealth (INDEPENDENT_AMBULATORY_CARE_PROVIDER_SITE_OTHER): Payer: Medicare Other

## 2016-02-18 DIAGNOSIS — N390 Urinary tract infection, site not specified: Secondary | ICD-10-CM

## 2016-02-18 LAB — POC URINALSYSI DIPSTICK (AUTOMATED)
BILIRUBIN UA: NEGATIVE
Glucose, UA: NEGATIVE
KETONES UA: NEGATIVE
Nitrite, UA: POSITIVE
Spec Grav, UA: 1.01
Urobilinogen, UA: 0.2
pH, UA: 8

## 2016-02-18 NOTE — Telephone Encounter (Signed)
Is he having urinary symptoms? If just a catheter issue.. Have her call uro.

## 2016-02-18 NOTE — Telephone Encounter (Signed)
Please Advise

## 2016-02-18 NOTE — Telephone Encounter (Signed)
Chelsea pts step daughter left v/m(do not see DPR); pt has a lot of infection in catheter urine bag; Chelsea said usually she brings in urine specimen to avoid ED visit. Chelsea request cb with what to do. Last seen [facility] on 02/05/16 and last seen in [facility] ED on 02/16/16.Please advise.

## 2016-02-18 NOTE — Telephone Encounter (Signed)
Spoke with [place].  She states Mr. Dewing went to the ED yesterday and all the did was change his catheter but didn't test his urine.   She went over there today and the urine was by-passing the catheter and when she removed the bag to collect a clean catch it was like jelly coming out and the the urine started flowing through the catheter tubing.  She thought his urine looked awful so that is why she dropped off a urine sample.  She states he is complaining of abdominal pain and just not feeling like himself.

## 2016-02-18 NOTE — Telephone Encounter (Signed)
Chelsea notified as instructed by telephone.  Mr. Bourdon is not on any antibiotics currently.  Advised Chelsea I would call her as soon as we have the urine culture results back to discuss treatment plans.

## 2016-02-18 NOTE — Telephone Encounter (Signed)
12/23 pseudomonas and kleibsiella in urine 1/2 pseudomonas treated with cipro.. Completed 1/12  Per ER note.. They though he was currently taking antibiotics.. Please verify to see if he is currently on.  UA is abnormal  today. Send urine culture.  Once this comes back we will treat... We want to avoid antibiotic resistance developing.  If pt with fever or confusion let me know ASAP.

## 2016-02-18 NOTE — Telephone Encounter (Signed)
PTs step-daughter states that the urine is bypassing the catheter and when she removes the bag to get a clean catch the urine then flows through the catheter tubing. She would like a call back. She dropped off a specimen to be tested. Her call back number is [phone].

## 2016-02-20 LAB — URINE CULTURE

## 2016-02-22 ENCOUNTER — Telehealth: Payer: Self-pay | Admitting: *Deleted

## 2016-02-22 MED ORDER — AMOXICILLIN-POT CLAVULANATE 875-125 MG PO TABS: 1.0000 | ORAL_TABLET | Freq: Two times a day (BID) | ORAL | 0 refills | Status: DC

## 2016-02-22 MED ORDER — OSELTAMIVIR PHOSPHATE 75 MG PO CAPS: 75.0000 mg | ORAL_CAPSULE | Freq: Every day | ORAL | 0 refills | Status: AC

## 2016-02-22 NOTE — Telephone Encounter (Signed)
Chelsea notified as instructed by telephone.  Augmentin sent into [facility] [address]  [name] states her younger sister was around Mr. Yannuzzi this weekend and tested positive for the flu today. She is wanting to know if Dr. Ermalene Searing would like to send in Tamiflu for him as well?  Please advise.

## 2016-02-22 NOTE — Telephone Encounter (Signed)
Chelsea notified Tamiflu has been sent into [facility] [address] as well.

## 2016-02-22 NOTE — Telephone Encounter (Signed)
Let family and pt know tamiflu sent in for 10 day daily course for prevention of flu.

## 2016-02-22 NOTE — Telephone Encounter (Signed)
-----   Message from Excell Seltzer, MD sent at 02/22/2016  8:28 AM EST ----- Please let pt know he does have UTI. Send  in ASAP Augmentin 875 mg 1 tab po BID x 10 days, #20, 0RF.

## 2016-02-25 ENCOUNTER — Other Ambulatory Visit: Payer: Self-pay | Admitting: Family Medicine

## 2016-02-25 NOTE — Telephone Encounter (Signed)
Last office visit 02/05/2016.  Last refilled 08/26/2015 for #[id number] with no refills. Ok to refill?

## 2016-02-29 ENCOUNTER — Ambulatory Visit: Payer: Medicare Other | Attending: Family Medicine | Admitting: Physical Therapy

## 2016-02-29 ENCOUNTER — Encounter: Payer: Self-pay | Admitting: Physical Therapy

## 2016-02-29 ENCOUNTER — Ambulatory Visit: Payer: Medicare Other | Admitting: Physical Therapy

## 2016-02-29 DIAGNOSIS — M6281 Muscle weakness (generalized): Secondary | ICD-10-CM | POA: Diagnosis not present
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
## 2016-03-09 ENCOUNTER — Other Ambulatory Visit: Payer: Self-pay | Admitting: Family Medicine
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
## 2016-03-09 NOTE — Telephone Encounter (Signed)
He issuing a lot of ibuprofen.. This can cause ulcer. I recommend decreasing use and try to use tylenol instead.

## 2016-03-09 NOTE — Telephone Encounter (Signed)
Last office visit 02/05/2016.  Last refilled 01/08/2016 for #90.  Pharmacy is requesting 90 day supply.  Ok to refill?

## 2016-03-11 ENCOUNTER — Encounter (HOSPITAL_COMMUNITY): Payer: Self-pay | Admitting: Emergency Medicine

## 2016-03-11 ENCOUNTER — Emergency Department (HOSPITAL_COMMUNITY)
Admission: EM | Admit: 2016-03-11 | Discharge: 2016-03-11 | Disposition: A | Discharge status: Home / Self Care | Payer: Medicare Other | Attending: Emergency Medicine | Admitting: Emergency Medicine

## 2016-03-11 DIAGNOSIS — T83038A Leakage of other indwelling urethral catheter, initial encounter: Secondary | ICD-10-CM | POA: Diagnosis not present

## 2016-03-11 DIAGNOSIS — Z87891 Personal history of nicotine dependence: Secondary | ICD-10-CM | POA: Insufficient documentation

## 2016-03-11 DIAGNOSIS — R309 Painful micturition, unspecified: Secondary | ICD-10-CM | POA: Diagnosis not present

## 2016-03-11 DIAGNOSIS — N390 Urinary tract infection, site not specified: Secondary | ICD-10-CM | POA: Diagnosis not present

## 2016-03-11 DIAGNOSIS — T839XXA Unspecified complication of genitourinary prosthetic device, implant and graft, initial encounter: Secondary | ICD-10-CM

## 2016-03-11 DIAGNOSIS — Y733 Surgical instruments, materials and gastroenterology and urology devices (including sutures) associated with adverse incidents: Secondary | ICD-10-CM | POA: Insufficient documentation

## 2016-03-11 DIAGNOSIS — Z79899 Other long term (current) drug therapy: Secondary | ICD-10-CM | POA: Insufficient documentation

## 2016-03-11 DIAGNOSIS — I10 Essential (primary) hypertension: Secondary | ICD-10-CM | POA: Diagnosis not present

## 2016-03-11 DIAGNOSIS — R1030 Lower abdominal pain, unspecified: Secondary | ICD-10-CM | POA: Diagnosis not present

## 2016-03-11 DIAGNOSIS — R531 Weakness: Secondary | ICD-10-CM | POA: Diagnosis not present

## 2016-03-11 LAB — URINALYSIS, ROUTINE W REFLEX MICROSCOPIC
BILIRUBIN URINE: NEGATIVE
Glucose, UA: NEGATIVE mg/dL
KETONES UR: NEGATIVE mg/dL
Nitrite: POSITIVE — AB
Protein, ur: >=300 mg/dL — AB
SQUAMOUS EPITHELIAL / LPF: NONE SEEN
Specific Gravity, Urine: 1.016 (ref 1.005–1.030)
pH: 7 (ref 5.0–8.0)

## 2016-03-11 MED ORDER — CEPHALEXIN 500 MG PO CAPS: 500.0000 mg | ORAL_CAPSULE | Freq: Two times a day (BID) | ORAL | 0 refills | Status: DC

## 2016-03-11 NOTE — ED Notes (Signed)
Patient had no drainage in foley since insertion. Marcelino Duster PA aware.

## 2016-03-11 NOTE — ED Notes (Signed)
Bed: RESB Expected date: 03/11/16 Expected time: 2:36 PM Means of arrival: Ambulance Comments: Foley not draining

## 2016-03-11 NOTE — Discharge Instructions (Signed)
Your urine still looks infected today.  If you are on other antibiotics, recommend to stop them and start taking the keflex. Follow-up with your primary care doctor. Return to the ED for new or worsening symptoms-- recurrent leakage from catheter, high fever, etc.

## 2016-03-11 NOTE — ED Notes (Signed)
Bed: WU98 Expected date:  Expected time:  Means of arrival:  Comments: ETOH, xanax overdose

## 2016-03-11 NOTE — ED Notes (Signed)
PTAR made aware of transport back to residence
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
## 2016-03-11 NOTE — ED Triage Notes (Signed)
Per [facility] patient comes from home for foley catheter leaking around foley instead of into cath.  Patient reports having problems with catheter for 2 days.

## 2016-03-11 NOTE — ED Notes (Signed)
Wife Diane contacted states she will have daughter go to his home

## 2016-03-11 NOTE — ED Notes (Signed)
Daughter [name] contacted and she states she could be contacted when he arrived at home and she would make sure he could get in his home.

## 2016-03-13 LAB — URINE CULTURE: Culture: >=100000 — AB

## 2016-03-14 ENCOUNTER — Telehealth: Payer: Self-pay | Admitting: Emergency Medicine

## 2016-03-14 NOTE — Telephone Encounter (Signed)
Post ED Visit - Positive Culture Follow-up  Culture report reviewed by antimicrobial stewardship pharmacist:  []  Enzo Bi, Pharm.D. []  Celedonio Miyamoto, Pharm.D., BCPS []  Garvin Fila, Pharm.D. [x]  Georgina Pillion, Pharm.D., BCPS []  [place], [address].D., BCPS, AAHIVP []  Estella Husk, Pharm.D., BCPS, AAHIVP []  Tennis Must, [address].D. []  Rob Oswaldo Done, [address].D.  Positive urine culture Treated with cephalexin, organism sensitive to the same and no further patient follow-up is required at this time.  Berle Mull 03/14/2016, 10:23 AM

## 2016-03-15 ENCOUNTER — Other Ambulatory Visit: Payer: Self-pay | Admitting: *Deleted

## 2016-03-15 DIAGNOSIS — I10 Essential (primary) hypertension: Secondary | ICD-10-CM

## 2016-03-15 DIAGNOSIS — G35 Multiple sclerosis: Secondary | ICD-10-CM

## 2016-03-15 NOTE — Patient Outreach (Signed)
[facility]) Care Management  03/15/2016  Travis Cantrell 11/13/1960 [id number]   RN Health Coach telephone call to patient.  Hipaa compliance verified. This patient was referred as a high  ER utilization. Per patient he has MS. He is unable to get out of the bed. Her has a friend name Amarius that stays with him and is his caregiver.  Patient has a Nurse, adult. Per patient he uses the [employer] bus for transportation.  The caregiver feeds and turns him. Per patient he has a hx of HTN, Gastric esophageal reflux disease. Hyperlipidemia and Multiple sclerosis.  Patient stated he is able to afford his medications at this time.Per patient his air mattress stays inflated all the time. He needs a Sports administrator. He is not sure if he has a decubitus since his air mattress is malfunctioning. Per patient he has called the company several times and no one comes out to fix it.  This patient stated he would like for a nurse to come out and evaluate him for additional needs. Plan refer patient to community case management   Gean Maidens BSN RN [facility] Care Management [phone]

## 2016-03-18 ENCOUNTER — Other Ambulatory Visit: Payer: Self-pay | Admitting: *Deleted

## 2016-03-18 ENCOUNTER — Encounter: Payer: Self-pay | Admitting: *Deleted

## 2016-03-18 NOTE — Patient Outreach (Addendum)
[facility]) Care Management [facility] CM Telephone Outreach  03/18/2016  Travis Cantrell 1960/09/24 [id number]  Successful telephone outreach to Travis Cantrell, 56 y/o male referred to [facility] CM from [facility] telephonic RN CM for multiple ED visits and assessment/ evaluation of patient care needs.  Patient has history including but not limited to MS, HTN, GERD, recurrent UTI secondary to permanent foley catheter placement.  Patient has had 4 IP hospital admissions since 2017, and 9 ED visits resulting in discharge home since 2017.  HIPAA/ identity verified with patient over phone today.    Discussed [facility] CM services with patient, and patient was agreeable to [facility] involvement in his care; I explained the difference between [facility] CM and home health services, and the patient verbalized understanding. Verbal consent for [facility] CM services obtained from patient today, and patient provided verbal permission to speak with his caregiver Travis Cantrell during today's telephone conversation, which was on speaker mode and included both patient and his caregiver.   Today, Travis Cantrell reports that he is "doing just fine."  Patient denies pain today.  -- reports that he has live-in caregiver, "Travis Cantrell" who takes care of his needs;  Travis Cantrell stated that patient "cannot do anything for himself," and requires "total care."  Deny home health involvement in patient's care at present time.  Patient and caregiver both refuse to elaborate on patient's "total care needs," today, stating they are watching their soap operas, and do not wish to spend time on the phone discussing patient's care.  I explained that I needed additional information in order to prepare for future home visit, but both flatly refused to answer many of my questions, stating, "you'll just have to find out when you come visit."  -- medications:  Reports that medication is administered by caregiver Travis Cantrell, but Travis Cantrell states that  "someone else puts all of his medications in a pill box," stating that he Travis Cantrell) "just gives him the medicine from the pill box."  Travis Cantrell/ patient refuse to tell me today whom the person is that prepares the weekly pill box for patient, and both refuse to review medications during phone call today.  -- Advanced Directives:  None currently in place; patient agrees to my bringing information packet at time of home visit.  -- Safety/ mobility:  Patient is bed/ wheelchair bound, and according to caregiver Travis Cantrell, "can do nothing for himself."  Again, neither Travis Cantrell nor patient will elaborate on patient's care needs.  Patient denies recent falls today, and states that he has not fallen in the last year.  -- Publishing rights manager Needs:  Veterinary surgeon acknowledges that patient currently has [employer] in place for transportation to and from provider appointments, however, both patient and caregiver state, "he can use all the help he can get" around community resources; again patient and caregiver refuse to elaborate on specific patient needs.  -- Provider appointments:  Patient/ caregiver refuse to elaborate/ answer questions regarding previous and upcoming scheduled provider appointments.  After a few minutes on phone, caregiver Travis Cantrell stated that he was "sick of answering all these questions," and refused to continue talking on phone today.  Travis Cantrell stated that I "would just have to come out to see him in person," adding, "but be prepared-- you don't know what you are getting yourself into."  When I explained that information/ questions during today's phone call was an important part of my being prepared for the visit, he said, "you're just going to have to wait  until you come over," and added, "be sure to bring your ear plugs."  When I questioned why I would need ear plugs, Travis Cantrell said, "I'm not telling you-- why does a person normally need ear plugs?"  I asked that Travis Cantrell please explain his comment to  me, and he again refused to do so. Travis Cantrell requested that I not contact the patient again by phone, and stated several times that I should "just come out to the house," to get the information I was requesting today by phone.  Throughout today's 20 minute phone conversation, patient was pleasant and stated over and over that "everything is fine;" however, caregiver Travis Cantrell had a verbally confrontational/ questionably threatening tone in his responses.  [facility] CSW referral was placed, requesting joint home visit, as I am uncomfortable with [facility] staff making home visits alone due to caregiver's responses during today's phone call.   Patient denies further questions, concerns, needs, or problems at this time, again stating that he wished to end telephone conversation.  We scheduled [facility] CM initial home visit for next month, and I provided patient the phone numbers for the [facility] 24-hour nurse line, my direct phone number, and the main number to [facility] CM.    Plan:  [facility] CSW referral placed due to patient/ caregiver comment that patient had community resource needs.  [facility] CM initial home visit scheduled for next month.  I will make patient's PCP aware of [facility] CM involvement in patient's care.   Caryl Pina, RN, BSN, [employer] [facility] Care Management  [phone]

## 2016-03-25 ENCOUNTER — Other Ambulatory Visit: Payer: Self-pay | Admitting: Family Medicine

## 2016-03-25 NOTE — Telephone Encounter (Signed)
Last office visit 02/05/2016.  Ok to refill?

## 2016-03-31 ENCOUNTER — Encounter: Payer: Self-pay | Admitting: Family Medicine

## 2016-03-31 ENCOUNTER — Ambulatory Visit (INDEPENDENT_AMBULATORY_CARE_PROVIDER_SITE_OTHER): Payer: Medicare Other | Admitting: Family Medicine

## 2016-03-31 VITALS — BP 140/80 | HR 82 | Temp 97.7°F

## 2016-03-31 DIAGNOSIS — R51 Headache: Secondary | ICD-10-CM

## 2016-03-31 DIAGNOSIS — Z872 Personal history of diseases of the skin and subcutaneous tissue: Secondary | ICD-10-CM | POA: Insufficient documentation

## 2016-03-31 DIAGNOSIS — R519 Headache, unspecified: Secondary | ICD-10-CM

## 2016-03-31 DIAGNOSIS — G35 Multiple sclerosis: Secondary | ICD-10-CM

## 2016-03-31 NOTE — Progress Notes (Signed)
Pre visit review using our clinic review tool, if applicable. No additional management support is needed unless otherwise documented below in the visit note. 

## 2016-03-31 NOTE — Assessment & Plan Note (Signed)
Improved with carbamezapine.

## 2016-03-31 NOTE — Progress Notes (Signed)
Subjective:    Patient ID: Travis Cantrell, male    DOB: 06-28-1960, 56 y.o.   MRN: [id number]  HPI   56 year old male with primary progressive multiple sclerosis presents for mobility assessment to acquire a new wheel chair.  He recently had PT eval.. At [facility] rehab on 02/29/2016.  I agree and concur with this PT eval.   He needs this power wheelchair  to perform  of ADL.   He is unable to feed himself, dress himself, toilet on his own, unable to bath and groom himself. He is continent of bowel but incontinent of  Bladder, he has a urinary catheter. Without the power wheelchair he would be bed bound. He is currently wheelchair bound given his weakness and contracture from MS.  His current chair is not working.    He requires a  tilt and recline feature for his  wheel chair... To assist with relieving pressure on neck and on buttocks.  This allows him to decrease the amount of transfers and allows him to remain in chair more often.    He has history of decubitus ulcer.  Blood pressure 140/80, pulse 82, temperature 97.7 F (36.5 C), temperature source Oral. Weight measured at recent visit at 170 lbs.  Social History /Family History/Past Medical History reviewed and updated if needed.  Review of Systems  Constitutional: Positive for fatigue. Negative for fever.  HENT: Negative for ear pain.   Eyes: Negative for pain.  Respiratory: Negative for cough and shortness of breath.   Cardiovascular: Negative for chest pain.  Genitourinary: Negative for dysuria.  Musculoskeletal:        achiness in  Right lower leg  Neurological: Positive for weakness. Negative for dizziness and numbness.       Objective:   Physical Exam  Constitutional: He is oriented to person, place, and time. Vital signs are normal. He appears well-developed and well-nourished.  HENT:  Head: Normocephalic.  Right Ear: Hearing normal. No middle ear effusion.  Left Ear: Hearing normal.  No middle ear effusion.    Nose: Nose normal. No rhinorrhea.  Mouth/Throat: Oropharynx is clear and moist and mucous membranes are normal. No posterior oropharyngeal edema or posterior oropharyngeal erythema.  Eyes: Conjunctivae, EOM and lids are normal. Pupils are equal, round, and reactive to light.  Neck: Trachea normal. No spinous process tenderness and no muscular tenderness present. Carotid bruit is not present. Decreased range of motion present. No erythema present. No thyroid mass and no thyromegaly present.  Cardiovascular: Normal rate, regular rhythm and normal pulses.  Exam reveals no gallop, no distant heart sounds and no friction rub.   No murmur heard. Bilateral peripheral edema in feet  Pulmonary/Chest: Effort normal and breath sounds normal. No respiratory distress.  Musculoskeletal:       Right shoulder: He exhibits decreased range of motion.       Left shoulder: He exhibits decreased range of motion.       Right elbow: He exhibits decreased range of motion.       Left elbow: He exhibits decreased range of motion.       Cervical back: He exhibits decreased range of motion.       Thoracic back: He exhibits decreased range of motion.       Lumbar back: He exhibits decreased range of motion.   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 He displays atrophy. He displays no  tremor. No cranial nerve deficit or sensory deficit. He exhibits abnormal muscle tone. Gait abnormal. Coordination normal.  Upper ext L: shoulder int and ext rot, abdand add: 1/5 Otherwise 0/5 R:shoulder int and ext rot, abdand add: 1/5 Left grip strength 1/5 1/5 wrist  Flexion, 0/5 wrist extension Otherwise 0/5 Lower ext  L:0/5 entire extremity R: 0/5 lower extremity  Skin: Skin is warm, dry and intact. No rash noted.  Very dry flaky skin  Psychiatric: He has a normal mood and affect. His speech is normal and behavior is normal. Thought content normal.           Assessment & Plan:

## 2016-04-01 ENCOUNTER — Ambulatory Visit: Payer: Medicare Other | Admitting: Family Medicine

## 2016-04-07 ENCOUNTER — Other Ambulatory Visit: Payer: Self-pay | Admitting: *Deleted

## 2016-04-07 ENCOUNTER — Encounter: Payer: Self-pay | Admitting: *Deleted

## 2016-04-07 NOTE — Patient Outreach (Addendum)
[facility]) Care Management  [place] Initial Home Visit  04/07/2016  Travis Cantrell Sep 25, 1960 [id number]  "Travis Cantrell" Travis Cantrell is an 56 y.o. male referred to [place] from [facility] telephonic RN CM for multiple ED visits and assessment/ evaluation of patient care needs.  Patient has history including but not limited to MS, HTN, GERD, recurrent UTI secondary to permanent foley catheter placement.  Patient has had 4 IP hospital admissions since 2017, and 9 ED visits resulting in discharge home since 2017.  HIPAA/ identity verified with patient in person today.  [place] and patient's roommate/ caregiver [name] is present during today's home visit.  [facility] CM services discussed with patient and verbal/ written obtained today; patient unable to sign full signature on consent due to bilateral UE contractures, which limit the use of his hands; patient signed consent with "X."  Today, Travis Cantrell reports that he is "doing just fine," and he denies pain, problems, or concerns today.  -- Reports that live-in caregiver/ roommate/ friend "[name]" takes care of all of his needs, including bathing, assistance with preparing meals/ feeding, and administering his medications.  Reports that he feels his "biggest issue" is "frequent UTI's because of my foley catheter" and need for additional assistance with bathing, as he "would like to give [name] a break sometimes," adding that [name] has his own health issues and sometimes does not always feel up to "all the work" patient asks/ requires of him.  Denies home health involvement in his care at present time, but states, "they told me that they were ordering this for me 3 months ago for help with bathing, but I've never heard back about it..... Nothing ever came of it."  Patient also states that he would like information on possible alternative options to indwelling foley catheter, as he "knows this causes a lot of my infections and  hospital visits."  Patient states that he has "heard about surgery" that can replace the need for permanent indwelling catheter.  Travis Cantrell states that his step-daughter regularly assist with changing his catheters about "every month," stating that she "has been trained" and has done this for him "ever since I've had the catheter."  -- Medications:  Reports that daily medication is administered by caregiver [name], but that his step-daughter comes to his home weekly to prepare his pill box.  Patient is not able to discuss his medications without looking at last PCP AVS, stating that his step-daughter "handles the details" of his medications.  Patient cannot tell me if he is on multiple antibiotics, or just one, and consistently says, "if it is on the list from the doctor's office, then I am taking it."  [name] showed me the prepared pill box he administers medications from.  Medication reconciliation performed today with patient using AVS from recent PCP appointment.  -- Advanced Directive (AD) Planning:  Marquavion states that he knows he "should get all of that in place," and states today that he has neither living Will nor HCPOA. AD information packet was provided and thoroughly discussed with patient today.  -- Safety/ mobility:  Patient is bed/ wheelchair bound, and is dependent on others for all of his care needs.  Patient has hoyer lift and motorized wheelchair.  States that during recent PCP visit 03/31/16, new wheelchair was ordered; patient states he is "waiting" for call that wheelchair is ready for pickup.  Denies new/ recent falls.  No obvious fall risks/ hazards noted in patient's environment today.  General fall risk/ prevention discussed with patient and caregiver [name] today.  -- [employer] Needs:  [name] states currently has SCAT in place for transportation to and from provider appointments.  [facility] CSW Chrystal Land thoroughly discussed community resource needs with patient and caregiver today--  pls see [facility]N CSW notes from today's visit.   -- Provider appointments:  Travis Cantrell has no upcoming scheduled provider appointments.  Latrelle states that he knows to contact PCP for any concerns, issues, problems that arise.  Patient denies further issues, concerns, or problems today.  I confirmed that patient has my direct phone number, the main [facility] CM office phone number, and the [facility] CM 24-hour nurse advice phone number should issues arise prior to next scheduled [place] outreach, which we agreed would be by telephone in 2-3 weeks.  Subjective: "I think I am getting along pretty good, considering I'm not really able to do anything for myself.  I would like to have some assistance with things like bathing, so [name] doesn't have to do so much for me."  Objective:    BP (!) 142/88   Pulse (!) 56   Resp 18   SpO2 97%    Review of Systems  Eyes: Negative.   Respiratory: Negative.  Negative for cough, sputum production, shortness of breath and wheezing.   Cardiovascular: Negative.  Negative for chest pain, orthopnea and leg swelling.  Gastrointestinal: Negative.  Negative for abdominal pain and nausea.  Genitourinary:       Permanent foley catheter in place due to loss of bladder control with long standing history of MS and resultant frequent UTI; foley intact and draining cloudy yellow urine; leg dressing intact and holding tubing without pulling/ kinking noted  Musculoskeletal: Negative for falls.       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   Neurological: Positive for weakness.       Occasional spontaneous muscle spasm/ jerking of (R) leg noted; stops after a few seconds without intervention; patient reports this happens "a lot" throughout normal course of day  Psychiatric/Behavioral: Negative for depression. The patient is not nervous/anxious.     Physical Exam  Constitutional: He is oriented  to person, place, and time. He appears well-developed and well-nourished. No distress.  Cardiovascular: Regular rhythm, normal heart sounds and intact distal pulses.  Bradycardia present.   Pulses:      Radial pulses are 2+ on the right side, and 2+ on the left side.       Dorsalis pedis pulses are 2+ on the right side, and 2+ on the left side.  Respiratory: Effort normal and breath sounds normal. No respiratory distress. He has no wheezes. He has no rales.  GI: Soft. Bowel sounds are normal.  Genitourinary: Penis normal.  Genitourinary Comments: Foley intact, draining cloudy yellow urine  Musculoskeletal: He exhibits no edema.  Bilateral UE and LE contractures-- see ROS  Neurological: He is alert and oriented to person, place, and time. He exhibits abnormal muscle tone. Coordination normal.  Skin: Skin is warm and dry.  Psychiatric: He has a normal mood and affect. His behavior is normal. Judgment and thought content normal.   Assessment:  Lonnie has lived with MS for 15 years and been bed-bound for several years, and requires assistance for all of his care needs.  Pamela has a supportive and actively involved live-in caregiver as well as local family members that participate  in his care.  Ardit would like to know what his options are for assistance with bathing, as well as options for alternatives for indwelling urinary catheter.  Despite his many health challenges, Scotty does not endorse being / nor does he appear to be depressed or hopeless.  Plan:   Patient will communicate care/ resource needs to [facility] RN CM and [facility] CSW as needed.  Patient will take medications as prescribed and will attend all scheduled provider appointments.  Patient will notify his medical providers for any new concerns, issues, or problems that arise.  I will send notes from today's [facility] CM inital home visit to patient's PCP, and will inquire about possibility of having home health CNA order placed for bathing  assistance.  [place] outreach to continue with scheduled telephone outreach in 2-3 weeks.  [facility] CM Care Plan Problem One     Most Recent Value  Care Plan Problem One  Frequent hospital and ED visits in patient requiring total care at home  Role Documenting the Problem One  Care Management [place] for Problem One  Active  [facility]N Long Term Goal (31-90 days)  Over the next 31 days, patient will verbalize plan for care needs at home, as evidenced by patient/ caregiver reporting during [place] Term Goal Start Date  03/18/16  Interventions for Problem One Long Term Goal  Utilizing teachback method, discussed patient's current state of health/ care needs with patient and his caregiver [name],  discussed with patient what his goals of care at home are  [facility] CM Short Term Goal #1 (0-30 days)  Within the next 30 days, patient will meet with [facility] RN and CSW to discuss his care needs, as evidenced by successful completion of [place] initial home visit  [facility] CM Short Term Goal #1 Start Date  03/18/16  [facility] CM Short Term Goal #1 Met Date  04/07/16  Interventions for Short Term Goal #1  Utilizing teachback method, discussed with patient and his caregiver [name] THN CM services/ role of [facility] RN and [facility] CSW,  discussed that I would make THN CSW referral     [facility] CM Care Plan Problem Two     Most Recent Value  Care Plan Problem Two  Knowledge deficit related to possible options for in-home bathing assistance and possibility of alternative options for indwelling foley catheter  Role Documenting the Problem Two  Care Management Coordinator  Care Plan for Problem Two  Active  THN CM Short Term Goal #1 (0-30 days)  Within the next 30 days, patient will be able to verbalize options for additional bathing care needs and indwelling foley catheter, as evidenced by patient reporting during [facility] RN CM outreach  [facility] CM Short Term Goal #1 Start Date  04/07/16   Interventions for Short Term Goal #2   Utilizing teachback method, discussed with patient options for in-home bathing assistance and alternative options for indwelling foley catheter     I appreciate the opportunity to participate in Sonia's care,  Oneta Rack, RN, BSN, [employer] Coordinator [facility] Care Management  [phone]

## 2016-04-08 ENCOUNTER — Encounter: Payer: Self-pay | Admitting: *Deleted

## 2016-04-08 NOTE — Patient Outreach (Signed)
[facility]) Care Management  [facility] Social Work  04/08/2016  Travis Cantrell 1960-08-28 [id number]  Subjective:  Patient is a 56 year old male referred to [facility] social work to assist with community resources related to in home care.  RNCM Ricke Hey present as well as patient's caregiver [name].  Per patient his roommate [name] assists with coompleting his ADL's, meals and medication. Patient reports having a step-daughter [name] who assist by filling his pill box. Patient described that his sister is also very supportive and helps with care as well. Per patient, he would like alternative options for in home support so that [name] could have a break. Patient states that he does not qualify for medicaid due to income requirements and is not eligible for Meals on Wheels based on his age.  Patient states that he is happy living where he is and will only move into a higher level of care "unless I have too".  Patient reprots feeling safe in his home and has 2 phones close by in the event of an emergency. Per patient, he is rarely left alone for long periods of time. Patient has an Mining engineer wheelchair, and uses SCAT for his transportation needs.   Objective:   Encounter Medications:  Outpatient Encounter Prescriptions as of 04/07/2016  Medication Sig Note  . albuterol (PROVENTIL) (2.5 MG/3ML) 0.083% nebulizer solution Take 3 mLs (2.5 mg total) by nebulization every 4 (four) hours as needed for wheezing or shortness of breath. 04/07/2016: Has not needed recently  . amoxicillin-clavulanate (AUGMENTIN) 875-125 MG tablet Take 1 tablet by mouth 2 (two) times daily.   . Aspirin-Acetaminophen-Caffeine (EXCEDRIN MIGRAINE PO) Take 1 tablet by mouth every 6 (six) hours as needed (migraine/headache).    Marland Kitchen atorvastatin (LIPITOR) 40 MG tablet TAKE ONE TABLET BY MOUTH ONCE DAILY   . butalbital-acetaminophen-caffeine (FIORICET) 50-325-40 MG tablet Take 1 tablet by mouth every 6 (six) hours as needed  for headache.   . carbamazepine (TEGRETOL) 200 MG tablet Take 1 tablet (200 mg total) by mouth 2 (two) times daily. For left sided pain likely trigeminal neuralgia   . cephALEXin (KEFLEX) 500 MG capsule Take 1 capsule (500 mg total) by mouth 2 (two) times daily.   . Cholecalciferol (VITAMIN D) 2000 units CAPS Take 4,000 Units by mouth daily.   . ciprofloxacin (CIPRO) 500 MG tablet Take 1 tablet (500 mg total) by mouth every 12 (twelve) hours. (Patient not taking: Reported on 02/05/2016)   . CRANBERRY PO Take 1 tablet by mouth daily.   . diazepam (VALIUM) 5 MG tablet TAKE ONE TABLET BY MOUTH EVERY 12 HOURS (Patient taking differently: Take 5 mg by mouth every 12 hours as needed for anxiety/spasms)   . diphenhydrAMINE (BENADRYL) 25 mg capsule Take 2 capsules (50 mg total) by mouth every 6 (six) hours as needed. (Patient taking differently: Take 25-50 mg by mouth every 6 (six) hours as needed for allergies or sleep. )   . feeding supplement, ENSURE ENLIVE, (ENSURE ENLIVE) LIQD Take 237 mLs by mouth 2 (two) times daily with a meal. (Patient taking differently: Take 237 mLs by mouth 2 (two) times daily as needed (poor appetite). )   . FLUoxetine (PROZAC) 20 MG capsule TAKE TWO CAPSULES BY MOUTH IN THE MORNING AND TAKE ONE CAPSULE IN THE EVENING (Patient taking differently: Take 40 mg by mouth in the morning and 20 mg in the evening)   . gabapentin (NEURONTIN) 300 MG capsule Take 3 capsules (900 mg total) by mouth 3 (  three) times daily. 01/26/2016: Usually only takes the morning & night doses, per caretaker  . gabapentin (NEURONTIN) 300 MG capsule TAKE THREE CAPSULES BY MOUTH THREE TIMES DAILY   . HYDROcodone-acetaminophen (NORCO/VICODIN) 5-325 MG tablet Take 1-2 tablets by mouth every 6 hours as needed for pain and/or cough. 04/07/2016: Takes as needed  . ibuprofen (ADVIL,MOTRIN) 800 MG tablet TAKE ONE TABLET BY MOUTH EVERY 8 HOURS AS NEEDED   . lisinopril (PRINIVIL,ZESTRIL) 20 MG tablet Take 1 tablet (20 mg  total) by mouth daily.   . metoCLOPramide (REGLAN) 10 MG tablet Take 1 tablet (10 mg total) by mouth every 6 (six) hours as needed. (Patient taking differently: Take 10 mg by mouth every 6 (six) hours as needed for nausea (or muscle spasms). )   . Multiple Vitamins-Minerals (ZINC PO) Take 1 tablet by mouth daily.   Marland Kitchen nystatin (MYCOSTATIN) 100000 UNIT/ML suspension Take 5 mLs (500,000 Units total) by mouth 4 (four) times daily.   Marland Kitchen nystatin (MYCOSTATIN) powder Apply 1 g topically daily.    Marland Kitchen nystatin (MYCOSTATIN/NYSTOP) powder APPLY TO RASH FOUR TIMES DAILY   . olopatadine (PATANOL) 0.1 % ophthalmic solution Place 1 drop into both eyes 2 (two) times daily. (Patient not taking: Reported on 04/07/2016)   . omeprazole (PRILOSEC) 20 MG capsule TAKE ONE CAPSULE BY MOUTH TWICE DAILY (Patient taking differently: Take 20 mg by mouth two times a day)    No facility-administered encounter medications on file as of 04/07/2016.     Functional Status:  In your present state of health, do you have any difficulty performing the following activities: 03/18/2016 07/06/2015  Hearing? N N  Vision? N N  Difficulty concentrating or making decisions? N N  Walking or climbing stairs? Y Y  Dressing or bathing? Y Y  Doing errands, shopping? [place]  Preparing Food and eating ? Y -  Using the Toilet? Y -  In the past six months, have you accidently leaked urine? Y -  Do you have problems with loss of bowel control? Y -  Managing your Medications? Y -  Managing your Finances? Y -  Housekeeping or managing your Housekeeping? Y -  Some recent data might be hidden    Fall/Depression Screening:   PHQ 2/9 Scores 03/18/2016 03/15/2016  PHQ - 2 Score 1 1    Assessment:  Patient was very friendly and engaging during the visit. He describes a supportive network of caregivers, however would like options for a back up. This Child psychotherapist discussed a referral to the [employer] that provides in home  assistance on a sliding scale basis. This social also discussed private duty care on a private pay basis if needed.   Patient discussed having no additional community resource needs. Plan:  This social worker will send patient a list of private duty aid options in the mail for his review as a back up to the care he has already arranged.               This Child psychotherapist will refer patient to the [facility].   [place], [place] [facility] Care Management [phone]

## 2016-04-11 ENCOUNTER — Other Ambulatory Visit: Payer: Self-pay | Admitting: Family Medicine

## 2016-04-11 ENCOUNTER — Encounter: Payer: Self-pay | Admitting: *Deleted

## 2016-04-11 ENCOUNTER — Other Ambulatory Visit: Payer: Self-pay | Admitting: *Deleted

## 2016-04-11 NOTE — Telephone Encounter (Signed)
Last office visit 03/31/2016.  Last refilled Diazepam 11/16/20187 for #30 with no refills.  Ibuprofen 03/09/2016 for #45 with no refills.  Ok to refill?

## 2016-04-11 NOTE — Patient Outreach (Signed)
[facility]) Care Management [facility] Community CM Telephone Outreach Care Coordination  04/11/2016  NEHAL WERLEY 07-18-1960 [id number]  Unsuccessful care coordination telephone outreach to Travis Newcomer, LPN with [facility] [phone])  re: "Travis Cantrell" Travis Cantrell is an 56 y.o. male referred to [facility] CM from [facility] telephonic RN CM for multiple ED visits and assessment/ evaluation of patient care needs. Patient has history including but not limited to MS, HTN, GERD, recurrent UTI secondary to permanent foley catheter placement. Patient has had 4 IP hospital admissions since 2017, and 9 ED visits resulting in discharge home since 2017.   Left Melissa a detailed voicemail message regarding possibility of having CNA bath assistance through [facility]; requested call back.  Plan:  Will collaborate with Melissa as indicated after call back is received.  Caryl Pina, RN, BSN, [employer] [facility] Care Management  [phone]

## 2016-04-12 ENCOUNTER — Other Ambulatory Visit: Payer: Self-pay | Admitting: *Deleted

## 2016-04-12 ENCOUNTER — Other Ambulatory Visit: Payer: Self-pay | Admitting: Family Medicine

## 2016-04-12 NOTE — Telephone Encounter (Signed)
Rx called in to requested pharmacy 

## 2016-04-12 NOTE — Patient Outreach (Signed)
[facility] Customer service manager [facility]) Care Management [facility] Community CM Telephone [facility] Coordination  04/12/2016  GLENROY CROSSEN [birth year] [id number]  Successful incoming care coordination telephone outreach to Henderson Newcomer, LPN with [facility]) [phone])  re: "Travis Cantrell"E Hurleyis an 56 y.o.malereferred to [facility] Community CM from [facility] telephonic RN CM for multiple ED visits and assessment/ evaluation of patient care needs. Patient has history including but not limited to MS, HTN, GERD, recurrent UTI secondary to permanent foley catheter placement. Patient has had 4 IP hospital admissions since 2017, and 9 ED visits resulting in discharge home since 2017.   Melissa confirmed today that for patient to have CNA bath assistance through [facility], skilled nursing or PT/OT must also be involved; shared with Melissa that patient denied skilled care needs during [facility] Community initial home visit last week and that [facility] CSW is involved in patient care needs evaluation as well.  Plan:  Will collaborate with/ update [facility] CSW with information provided by [facility] with [facility] today.   Caryl Pina, RN, BSN, [employer] [facility] Care Management  [phone]

## 2016-04-13 ENCOUNTER — Other Ambulatory Visit: Payer: Self-pay | Admitting: *Deleted

## 2016-04-13 NOTE — Patient Outreach (Signed)
[facility]) Care Management  04/13/2016  Travis Cantrell [birth year] [id number]   Phone call from patient's step-daughter [facility] yesterday requesting information on how to go about getting patient's advanced directive and HCPOA completed. She states that she has a notary that she could use, however was concerned that due to patient's MS he was only able to sign his name with an X.  This Child psychotherapist explained that as long as the signature was witnessed by a notary and 2 non-related witnesses the X with his signature would be fine. This Child psychotherapist offered to schedule a home visit to explain the Advanced Directive document, however offer declined.    Adriana Reams [facility] Care Management [phone]

## 2016-04-19 ENCOUNTER — Encounter: Payer: Self-pay | Admitting: *Deleted

## 2016-04-19 ENCOUNTER — Other Ambulatory Visit: Payer: Self-pay | Admitting: *Deleted

## 2016-04-19 NOTE — Patient Outreach (Signed)
[facility] Customer service manager [facility] [address]) Care Management [facility] Community CM Telephone Outreach  04/19/2016  Travis Cantrell 03-31-1960 [id number]  Successful telephone outreach to "Travis Cantrell, 56 y.o. male referred to [facility] Community CM from [facility] telephonic RN CM for multiple ED visits and assessment/ evaluation of patient care needs. Patient has history including but not limited to MS, HTN, GERD, recurrent UTI secondary to permanent foley catheter placement. Patient has had 4 IP hospital admissions since 2017, and 9 ED visits resulting in discharge home since 2017. HIPAA/ identity verified with patient during phone call today.   Today, [name] reports that he is "feeling pretty good," and he denies pain, problems, or concerns on his birthday today.  -- Reports that live-in caregiver/ roommate/ friend "[name]"continues taking care of all of his needs, including bathing, assistance with preparing meals/ feeding, and administering his medications.  States, "everything is going fine, there have been no changes."    -- Medications: Reports that daily medication is administered by caregiver [name], but that his step-daughter comes to his home weekly to prepare his pill box.    -- Advanced Directive (AD) Planning: states step-daughter continues working on Psychologist, educational.  -- Safety/ mobility: Patient is bed/ wheelchair bound, and is dependent on others for all of his care needs.  Patient has hoyer lift and motorized wheelchair.  States that he is waiting to obtain his newly ordered wheelchair, as "the details of my insurance co-payment" are currently being arranged by his step-daughter.  Patient is hopeful that he will be able to obtain his new wheelchair "sooner than later," as he reports it is "ready, the payment details just have to be worked out."  Until he obtains his newly ordered wheelchair, artist bloom plans to use his current wheelchair. Denies new/ recent falls.  General fall  risk/ prevention reiterated with patient today.  -- Publishing rights manager Needs: [facility] CSW [employer] is also involved in patient's care and is working on options for in-home assistance for patient bathing needs.  Patient reports that he has not yet received list of options in mail, but states that he may have "missed" and will double-check with caregiver.  Assured patient that Chrystal would be in touch with an update as soon as she has information to share.  Updated patient that I contacted [facility], and confirmed that they are unable to provide bathing assistance without skilled discipline of nursing or PT involvement, and patient verbalizes understanding.  -- Provider appointments: Jonny Ruiz has no upcoming scheduled provider appointments.  Sten again states that he knows to contact PCP for any concerns, issues, problems that arise.  Patient denies further issues, concerns, or problems today. I confirmed that patient hasmy direct phone number, the main [facility] CM office phone number, and the [facility] CM 24-hour nurse advice phone number should issues arise prior to next scheduled [facility] Community CM outreach, which we agreed would be by telephone in 2-3 weeks.  Plan:   Patient will communicate care/ resource needs to [facility] RN CM and [facility] CSW as needed.  Patient will take medications as prescribed and will attend all scheduled provider appointments.  Patient will notify his medical providers for any new concerns, issues, or problems that arise.  [facility] Community CM outreach to continue with scheduled telephone outreach in 2-3 weeks.  Caryl Pina, RN, BSN, [employer] [facility] Care Management  [phone]

## 2016-04-28 ENCOUNTER — Other Ambulatory Visit: Payer: Self-pay | Admitting: *Deleted

## 2016-04-28 NOTE — Patient Outreach (Signed)
[facility]) Care Management  04/28/2016  Travis Cantrell 01-29-1960 [id number]   Phone call to patient folllow up on resources mailed to him for private duty aids.  Patient did not answer. Phone call then made to his step-daughter [name] on [facility] consent who stated that she will check his mail today to ensure that it was received.   Plan: This social worker will check back with [name] within the next week to ensure that community resources for private duty aids have been received.    Adriana Reams [facility] Care Management [phone]

## 2016-05-04 ENCOUNTER — Other Ambulatory Visit: Payer: Self-pay | Admitting: *Deleted

## 2016-05-04 ENCOUNTER — Encounter: Payer: Self-pay | Admitting: *Deleted

## 2016-05-04 NOTE — Patient Outreach (Signed)
[facility] Customer service manager [facility]) Care Management [facility] Community CM Telephone Outreach  05/04/2016  Travis Cantrell [birth year] [id number]   Successful telephone outreach to "Travis Cantrell, 56 y.o.malereferred to [facility] Community CM from [facility] telephonic RN CM for multiple ED visits and assessment/ evaluation of patient care needs. Patient has history including but not limited to MS, HTN, GERD, recurrent UTI secondary to permanent foley catheter placement. Patient has had 4 IP hospital admissions since 2017, and 9 ED visits resulting in discharge home since 2017. HIPAA/ identity verified with patient during phone call today.   Today, Johnreports that he is "feeling pretty good," and hedenies pain, problems, or concerns, including current issues with his permanent foley catheter, stating that he "is doing fine."  Travis Cantrell denies signs/ symptoms of UTI/ acute problems with foley catheter.  -- Reports that live-in caregiver/ roommate/ friend"Travis Cantrell"continues taking care of all of his needs, including bathing, assistance with preparing meals/ feeding, and administering his medications. States, "everything is going fine, there have been no changes."    -- Medications: Reports that daily medication is administered by caregiver Travis Cantrell, but that his step-daughter comes to his home weekly to prepare his pill box.   -- Safety/ mobility: Patient is bed/ wheelchair bound, and is dependent on others for all of his care needs. Patient has hospital bed, hoyer lift, and motorized wheelchair. Continues to report that he is waiting to obtain his newly ordered wheelchair, reports that he has authorized automatic payments to be withdrawn from his bank account for payment, which have started; however, patient states that he has not heard back and is not sure when his wheelchair will be delivered/ ready for pick up.  Until he obtains his newly ordered wheelchair, Travis Cantrell plans to use his current  wheelchair.  Patient also reports an ongoing issue with his hospital bed today, stating that "it doesn't inflate properly."  Travis Cantrell states that he obtained hospital bed "a couple pf years ago" from [employer] in [place] and reports that he has placed several calls to them about this, without success.  Travis Cantrell verbalizes that he would like assistance contacting [employer] about the ongoing issues with his hospital bed.  Denies new/ recent falls. General fall risk/ prevention reiterated with patient today.  -- Publishing rights manager Needs: [facility] CSW [employer] is also involved in patient's care and is working on options for in-home assistance for patient bathing needs.  Patient reports that he "is not sure" if he has received list of options in mail, stating that his daughter handles his mail.  Encouraged Travis Cantrell to discuss with daughter for follow up.  -- Provider appointments: Reports upcoming appointment "early May" with [facility]; discussed research regarding alternative options for permanent foley catheter, ( surgical suprapubic catheter placement) which Travis Cantrell reports "occasionally leaks" and requires regular replacement.  Encouraged Travis Cantrell to discuss this option with urologist at upcoming appointment, and he verbalized understanding and agreement.  Travis Cantrell again states that he knows to contact PCP for any concerns, issues, problems that arise.  Patient denies further issues, concerns, or problems today. I confirmed that patient hasmy direct phone number, the main [facility] CM office phone number, and the [facility] CM 24-hour nurse advice phone number should issues arise prior to next scheduled [facility] Community CM outreach with routine home visit in 2 weeks, which was scheduled today during phone call.  Plan:   Patient will communicate care/ resource needs to [facility] RN CM and [facility] CSW as needed.  Patient will take medications as  prescribed and will attend all scheduled provider appointments.  Patient  will notify his medical providers for any new concerns, issues, or problems that arise.  I will attempt to contact [facility] regarding patient's questions about his hospital bed, and [facility] to obtain update on status of new wheelchair.  [facility] Community CM outreach to continue with scheduled routine home visit in 2 weeks.  Caryl Pina, RN, BSN, [employer] [facility] Care Management  [phone]

## 2016-05-06 ENCOUNTER — Other Ambulatory Visit: Payer: Self-pay | Admitting: *Deleted

## 2016-05-06 ENCOUNTER — Encounter: Payer: Self-pay | Admitting: *Deleted

## 2016-05-06 NOTE — Patient Outreach (Addendum)
[facility] Customer service manager [facility]) Care Management [facility] Community CM Telephone Outreach Care Coordination  05/06/2016  TAIWAN MADDOCKS 1960-10-07 [id number]   Successful telephone outreach to "Marylene Land" at [facility] [phone]) re: "Kinser"E Kuba,56 y.o.malereferred to [facility] Community CM from [facility] telephonic RN CM for multiple ED visits and assessment/ evaluation of patient care needs. Patient has history including but not limited to MS, HTN, GERD, recurrent UTI secondary to permanent foley catheter placement. Patient has had 4 IP hospital admissions since 2017, and 9 ED visits resulting in discharge home since 2017.   Patient had reported earlier this week ongoing issues with his hospital bed, stating that "it doesn't inflate properly."  Dalton verbalized that he would like assistance contacting [facility] about the ongoing issues with his hospital bed, as his prior attempts to contact [place] were unsuccessful.    Today, Marylene Land confirmed that hospital bed through [facility] was originally delivered to patient in May 2010; last mattress was supplied to patient in March 2017.  Marylene Land confirmed that patient may qualify for a new bed, but this would require order from PCP.  Marylene Land also stated that [facility] could go to patient's home for a service visit, which would cost $75.00 flat fee, along with charges for labor, depending on the problem.  Marylene Land confirmed that if patient decided to pursue new bed, [facility] would work with his insurance company to determine coverage details.  Marylene Land agrees to contact patient by phone to follow up on his concerns with his current bed.  I provided Marylene Land with my direct call-back number is she had additional questions/ follow up information.   Plan:   Will collaborate with [facility] as indicated to facilitate resolution of patient's concerns with his current bed/ mattress.  If I do not hear back from [facility], will discuss with patient during next [facility] CM outreach and proceed around patient's wishes to faciliate.  [facility] Community CM outreach to continue with scheduled routine home visit in 2 weeks.  Caryl Pina, RN, BSN, [employer] [facility] Care Management  [phone]

## 2016-05-06 NOTE — Patient Outreach (Signed)
[facility]) Care Management  05/06/2016  TEAGEN HOGLAND [birth year] [id number]  Successful telephone outreach to "Clarisse Gouge" at [facility]), [phone] re: "Hugo"E Matzen,56 y.o.malereferred to [facility] Community CM from [facility] telephonic RN CM for multiple ED visits and assessment/ evaluation of patient care needs. Patient has history including but not limited to MS, HTN, GERD, recurrent UTI secondary to permanent foley catheter placement. Patient has had 4 IP hospital admissions since 2017, and 9 ED visits resulting in discharge home since 2017.   Patient had reported earlier this week that he is unsure of the status of his newly ordered wheelchair through [facility]; Liberato verbalized that he would like assistance contacting [facility] to follow up.  Today, Clarisse Gouge confirmed that the wheelchair was ordered on April 21, 2016; Bridget transferred me to "Byrd Hesselbach" on the [facility] team, who eventually put me into contact with "Lyla Son" who confirmed that she spoke with patient's daughter [name] yesterday to confirm delivery of wheelchair scheduled for next Thursday May 12, 2016 between the hours of 3:00- 5:00 pm.  Plan:   Will reach out to patient by telephone next week to share information/ confirmation received today from [facility].   [facility] Community CM outreach to continue with scheduled routine home visitin 2 weeks.  Caryl Pina, RN, BSN, [employer] [facility] Care Management  [phone]

## 2016-05-09 ENCOUNTER — Encounter: Payer: Self-pay | Admitting: *Deleted

## 2016-05-09 ENCOUNTER — Other Ambulatory Visit: Payer: Self-pay | Admitting: *Deleted

## 2016-05-09 NOTE — Patient Outreach (Addendum)
[facility] Customer service manager Travis Cantrell) Cantrell Management [facility] Cantrell Travis Cantrell Telephone Outreach  05/09/2016  Travis Cantrell Nov 02, 1960 [id number]   Successful telephone outreach to "Travis Cantrell," step-daughter/ caregiver for "Travis Cantrell"E Porcaro,55 y.o.malereferred to Travis Cantrell Travis Cantrell from Travis Cantrell telephonic RN Cantrell for multiple ED visits and assessment/ evaluation of patient Cantrell needs. Patient has history including but not limited to MS, HTN, GERD, recurrent UTI secondary to permanent foley catheter placement. Patient has had 4 IP Cantrell admissions since 2017, and 9 ED visits resulting in discharge home since 2017. HIPAA/ identity verified with patient's step-daughter during phone call today.   Today, Travis Cantrell reports that patient's home phone may not have been in his reach this morning to accept my original call; states that she is his primary Cantrell giver, although she does not live with patient.  Travis Cantrell is a Engineer, civil (consulting) and she is involved in patient's Cantrell regularly and prepares his weekly pill box and transports him to provider appointments as allowed.  Travis Cantrell denies having patient concerns, or signs/ symptoms of UTI/ acute problems with foley catheter.  Call was placed today to confirm with patient that I had placed Cantrell coordination phone calls last week to Travis Cantrell Travis Cantrell) regarding patient's newly ordered wheelchair, and to Travis Cantrell regarding his stated concerns with his home Cantrell bed/ mattress.  Travis Cantrell confirmed that she has been working with Travis Cantrell on new wheelchair delivery and she confirms report provided by [facility] last week that wheelchair delivery will be this Thursday 05/12/16 between 3-5 pm.  Travis Cantrell was aware of patient's stated issues with his bed, but was not sure if Travis Cantrell had called patient last week as requested.  I provided Travis Cantrell the phone number for Travis Cantrell should she wish to follow up.  Acknowledged with Travis Cantrell that she is not  currently on Travis Cantrell Cantrell written consent, and encouraged her to discuss with patient, as she verbalizes today that it is best that I call her for any patient needs.  I confirmed that I would be visiting patient next week, and would ammend the written consent to add her, if the patient was agreeable, and Travis Cantrell stated that she would talk to patient about this.  Patient's step-daughter denies further issues, concerns, or problems today. I provided her withmy direct phone number should she have concerns or wish to contact me in the future.  Plan:   Patient will communicate Cantrell/ resource needs to Travis Cantrell and Travis Cantrell as needed.  Patient will take medications as prescribed and will attend all scheduled provider appointments.  Patient will notify his medical providers for any new concerns, issues, or problems that arise.  Travis Cantrell outreach to continue with scheduled routine home visit next week.  Travis Pina, RN, BSN, [employer] [facility] Cantrell Management  [phone]

## 2016-05-11 ENCOUNTER — Other Ambulatory Visit: Payer: Self-pay | Admitting: *Deleted

## 2016-05-11 ENCOUNTER — Emergency Department
Admission: EM | Admit: 2016-05-11 | Discharge: 2016-05-11 | Disposition: A | Discharge status: Home / Self Care | Payer: Medicare Other | Attending: Emergency Medicine | Admitting: Emergency Medicine

## 2016-05-11 ENCOUNTER — Encounter: Payer: Self-pay | Admitting: *Deleted

## 2016-05-11 DIAGNOSIS — Y733 Surgical instruments, materials and gastroenterology and urology devices (including sutures) associated with adverse incidents: Secondary | ICD-10-CM | POA: Diagnosis not present

## 2016-05-11 DIAGNOSIS — Z79899 Other long term (current) drug therapy: Secondary | ICD-10-CM | POA: Insufficient documentation

## 2016-05-11 DIAGNOSIS — I10 Essential (primary) hypertension: Secondary | ICD-10-CM | POA: Insufficient documentation

## 2016-05-11 DIAGNOSIS — T8189XA Other complications of procedures, not elsewhere classified, initial encounter: Secondary | ICD-10-CM | POA: Diagnosis not present

## 2016-05-11 DIAGNOSIS — T839XXA Unspecified complication of genitourinary prosthetic device, implant and graft, initial encounter: Secondary | ICD-10-CM

## 2016-05-11 DIAGNOSIS — Z87891 Personal history of nicotine dependence: Secondary | ICD-10-CM | POA: Diagnosis not present

## 2016-05-11 DIAGNOSIS — T83098A Other mechanical complication of other indwelling urethral catheter, initial encounter: Secondary | ICD-10-CM | POA: Diagnosis not present

## 2016-05-11 DIAGNOSIS — T83198A Other mechanical complication of other urinary devices and implants, initial encounter: Secondary | ICD-10-CM | POA: Diagnosis not present

## 2016-05-11 DIAGNOSIS — Z7401 Bed confinement status: Secondary | ICD-10-CM | POA: Diagnosis not present

## 2016-05-11 DIAGNOSIS — R6889 Other general symptoms and signs: Secondary | ICD-10-CM | POA: Diagnosis not present
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
## 2016-05-11 NOTE — ED Notes (Signed)
Patient reports "I think the balloon on my catheter popped last night. I've been leaking around the catheter since then." Denies any pain.

## 2016-05-11 NOTE — Discharge Instructions (Signed)
Follow closely with her primary care doctor make sure that her home health continues to come see you, return to the emergency room for any new or worrisome symptoms including fever or other complaints

## 2016-05-11 NOTE — ED Notes (Signed)
Patient covered in urine and stool upon undressing for catheter insertion. Multiple areas of skin breakdown, redness and swelling noted to both legs anterior and posteriorly as well as buttock . Patient taken off of wet linen and lift sling. Skin washed with cleansing wipes. Patient continues to deny pain. Patient reports he has not had home health come for "a while" and that he tries to turn himself to prevent ulcers. States "sometimes I can get my roommate to help bathe me and change my clothes." MD and case management made aware.

## 2016-05-11 NOTE — Patient Outreach (Addendum)
[facility] Customer service manager [facility]) Care Management [facility] Community CM Telephone Outreach Care Coordination  05/11/2016  Travis Cantrell 05-01-1960 [id number]  Successful incoming telephone outreach from Travis Cantrell, [facility] ED RN [phone]) re:"Travis Cantrell,55 y.o.malereferred to [facility] Community CM from [facility] telephonic RN CM for multiple ED visits and assessment/ evaluation of patient care needs. Patient has history including but not limited to MS, HTN, GERD, recurrent UTI secondary to permanent foley catheter placement. Patient has had 4 IP hospital admissions since 2017, and 9 ED visits resulting in discharge home since 2017.   Travis Cantrell called today to inform of patient's current ED visit (in progress) and to assess patient's in-home community care needs.  Updated Travis Cantrell on [facility] CM involvement which includes Nurse, children's CM and LCSW.  Travis Cantrell was updated on overall [facility] CCM plan of care, and I confirmed ongoing communication with patient regarding in-home care options, as noted by [facility] LCSW.   Plan:  Will update [facility] LCSW Travis Cantrell of today's phone call with ED RN CM as FYI-- successfully contacted Travis by phone at 4:00 pm and updated her  [facility] Community RN CM in-home visit scheduled for next week.  Caryl Pina, RN, BSN, [employer] [facility] Care Management  [phone]

## 2016-05-11 NOTE — Care Management Note (Signed)
Case Management Note  Patient Details  Name: Travis Cantrell MRN: [id number] Date of Birth: 1960-06-14  Subjective/Objective:        Spoke to [place] with [facility] [phone] and verified that he has a CM and CSW working with the patient to get him as many services as possible.            Action/Plan:   Expected Discharge Date:                  Expected Discharge Plan:     In-House Referral:     Discharge planning Services     Post Acute Care Choice:    Choice offered to:     DME Arranged:    DME Agency:     HH Arranged:    HH Agency:     Status of Service:     If discussed at [employer] of Stay Meetings, dates discussed:    Additional Comments:  Berna Bue, RN 05/11/2016, 2:44 PM

## 2016-05-11 NOTE — ED Triage Notes (Signed)
Pt has a indwelling urinary catheter that is leaking, pt is here to have catheter changed, pt denies any problems

## 2016-05-12 DIAGNOSIS — L89152 Pressure ulcer of sacral region, stage 2: Secondary | ICD-10-CM | POA: Diagnosis not present

## 2016-05-12 DIAGNOSIS — R0602 Shortness of breath: Secondary | ICD-10-CM | POA: Diagnosis not present

## 2016-05-12 DIAGNOSIS — G35 Multiple sclerosis: Secondary | ICD-10-CM | POA: Diagnosis not present

## 2016-05-12 DIAGNOSIS — L8992 Pressure ulcer of unspecified site, stage 2: Secondary | ICD-10-CM | POA: Diagnosis not present

## 2016-05-12 DIAGNOSIS — L89309 Pressure ulcer of unspecified buttock, unspecified stage: Secondary | ICD-10-CM | POA: Diagnosis not present

## 2016-05-18 ENCOUNTER — Other Ambulatory Visit: Payer: Self-pay | Admitting: *Deleted

## 2016-05-18 ENCOUNTER — Encounter: Payer: Self-pay | Admitting: *Deleted

## 2016-05-18 ENCOUNTER — Telehealth: Payer: Self-pay

## 2016-05-18 DIAGNOSIS — G35 Multiple sclerosis: Secondary | ICD-10-CM

## 2016-05-18 DIAGNOSIS — Z960 Presence of urogenital implants: Secondary | ICD-10-CM

## 2016-05-18 DIAGNOSIS — N390 Urinary tract infection, site not specified: Secondary | ICD-10-CM

## 2016-05-18 DIAGNOSIS — Z872 Personal history of diseases of the skin and subcutaneous tissue: Secondary | ICD-10-CM

## 2016-05-18 DIAGNOSIS — T839XXS Unspecified complication of genitourinary prosthetic device, implant and graft, sequela: Secondary | ICD-10-CM

## 2016-05-18 DIAGNOSIS — Z9189 Other specified personal risk factors, not elsewhere classified: Secondary | ICD-10-CM
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
## 2016-05-18 NOTE — Telephone Encounter (Signed)
Travis Cantrell with [facility] high risk community care coordinators; Travis Cantrell visited pt today; foley cath leaking again today but Travis Cantrell resolved by re-inflating balloon. Pt has ongoing issue with foley catheter; skin beginning to look like some break down; pt and Chelsea (primary care giver) request home health services; Travis Cantrell request home health nursing to do eval and treat and monitor skin care. Pt has appt with urologist on 05/24/16 to see if pt was candidate for suprapubic catheter. Travis Cantrell request cb.

## 2016-05-18 NOTE — Patient Outreach (Signed)
[facility]) Care Management  [facility] Social Work  05/18/2016  Travis Cantrell [birth year] [id number]  Subjective:  Patient is a 56 year old male. Patient reports that his step-daughter will continue to change his catheter however he will speak to his urologist during his next visit about a suprapubic catheter. Patient agrees to [facility] for skin care and possible catheter changes.  Patient states that daughter Travis Cantrell will assist with completing his iving will and has a a notary that she can utilize. Patient has a live in aid [name] who takes much pride in providing care for the past 6 years. Per patient, he is satisfied with the care received, he denies any abuse or harm stating that Travis Cantrell can be territorial with his care at times.. Per patient if he does not get what he needs, he can call his step-daughter Travis Cantrell for assistance.   Objective:   Encounter Medications:  Outpatient Encounter Prescriptions as of 05/18/2016  Medication Sig Note  . albuterol (PROVENTIL) (2.5 MG/3ML) 0.083% nebulizer solution Take 3 mLs (2.5 mg total) by nebulization every 4 (four) hours as needed for wheezing or shortness of breath. 04/07/2016: Has not needed recently  . amoxicillin-clavulanate (AUGMENTIN) 875-125 MG tablet Take 1 tablet by mouth 2 (two) times daily.   . Aspirin-Acetaminophen-Caffeine (EXCEDRIN MIGRAINE PO) Take 1 tablet by mouth every 6 (six) hours as needed (migraine/headache).    Marland Kitchen atorvastatin (LIPITOR) 40 MG tablet TAKE ONE TABLET BY MOUTH ONCE DAILY   . butalbital-acetaminophen-caffeine (FIORICET) 50-325-40 MG tablet Take 1 tablet by mouth every 6 (six) hours as needed for headache.   . carbamazepine (TEGRETOL) 200 MG tablet Take 1 tablet (200 mg total) by mouth 2 (two) times daily. For left sided pain likely trigeminal neuralgia   . cephALEXin (KEFLEX) 500 MG capsule Take 1 capsule (500 mg total) by mouth 2 (two) times daily.   . Cholecalciferol (VITAMIN D) 2000 units CAPS Take 4,000  Units by mouth daily.   . ciprofloxacin (CIPRO) 500 MG tablet Take 1 tablet (500 mg total) by mouth every 12 (twelve) hours. (Patient not taking: Reported on 02/05/2016)   . CRANBERRY PO Take 1 tablet by mouth daily.   . diazepam (VALIUM) 5 MG tablet TAKE ONE TABLET BY MOUTH EVERY 12 HOURS   . diphenhydrAMINE (BENADRYL) 25 mg capsule Take 2 capsules (50 mg total) by mouth every 6 (six) hours as needed. (Patient taking differently: Take 25-50 mg by mouth every 6 (six) hours as needed for allergies or sleep. )   . feeding supplement, ENSURE ENLIVE, (ENSURE ENLIVE) LIQD Take 237 mLs by mouth 2 (two) times daily with a meal. (Patient taking differently: Take 237 mLs by mouth 2 (two) times daily as needed (poor appetite). )   . FLUoxetine (PROZAC) 20 MG capsule TAKE TWO CAPSULES BY MOUTH IN THE MORNING AND TAKE ONE CAPSULE IN THE EVENING (Patient taking differently: Take 40 mg by mouth in the morning and 20 mg in the evening)   . gabapentin (NEURONTIN) 300 MG capsule Take 3 capsules (900 mg total) by mouth 3 (three) times daily. 01/26/2016: Usually only takes the morning & night doses, per caretaker  . gabapentin (NEURONTIN) 300 MG capsule TAKE THREE CAPSULES BY MOUTH THREE TIMES DAILY   . HYDROcodone-acetaminophen (NORCO/VICODIN) 5-325 MG tablet Take 1-2 tablets by mouth every 6 hours as needed for pain and/or cough. 04/07/2016: Takes as needed  . ibuprofen (ADVIL,MOTRIN) 800 MG tablet TAKE 1 TABLET BY MOUTH EVERY 8 HOURS AS NEEDED   .  lisinopril (PRINIVIL,ZESTRIL) 20 MG tablet Take 1 tablet (20 mg total) by mouth daily.   . metoCLOPramide (REGLAN) 10 MG tablet Take 1 tablet (10 mg total) by mouth every 6 (six) hours as needed. (Patient taking differently: Take 10 mg by mouth every 6 (six) hours as needed for nausea (or muscle spasms). )   . Multiple Vitamins-Minerals (ZINC PO) Take 1 tablet by mouth daily.   Marland Kitchen nystatin (MYCOSTATIN) 100000 UNIT/ML suspension Take 5 mLs (500,000 Units total) by mouth 4 (four)  times daily.   Marland Kitchen nystatin (MYCOSTATIN) powder Apply 1 g topically daily.    Marland Kitchen nystatin (MYCOSTATIN/NYSTOP) powder APPLY TO RASH FOUR TIMES DAILY   . olopatadine (PATANOL) 0.1 % ophthalmic solution Place 1 drop into both eyes 2 (two) times daily. (Patient not taking: Reported on 04/07/2016)   . omeprazole (PRILOSEC) 20 MG capsule TAKE ONE CAPSULE BY MOUTH TWICE DAILY (Patient taking differently: Take 20 mg by mouth two times a day)    No facility-administered encounter medications on file as of 05/18/2016.     Functional Status:  In your present state of health, do you have any difficulty performing the following activities: 03/18/2016 07/06/2015  Hearing? N N  Vision? N N  Difficulty concentrating or making decisions? N N  Walking or climbing stairs? Y Y  Dressing or bathing? Y Y  Doing errands, shopping? Malvin Johns  Preparing Food and eating ? Y -  Using the Toilet? Y -  In the past six months, have you accidently leaked urine? Y -  Do you have problems with loss of bowel control? Y -  Managing your Medications? Y -  Managing your Finances? Y -  Housekeeping or managing your Housekeeping? Y -  Some recent data might be hidden    Fall/Depression Screening:  PHQ 2/9 Scores 03/18/2016 03/15/2016  PHQ - 2 Score 1 1    Assessment:   Joint visit with  RNCM Ricke Hey.  Patient's quality of care explored, safety concerns discussed. Community resources for additional in home assistance provided as back up.  Patient agreed to [facility] for possible skin care and catheter changes. RNCM agrees to contact patient's doctor regarding this. Patient denied having any issues or concners with his current caregiver and is not interested in any additional supports.  It was explained that patient does not meet the age requirements for the [facility] Response Program, however patient was given a list of private duty aids that he could contact if needed in the future.   Plan: Patient to be closed to social work at  this time.           Patient's doctor and RNCM Ricke Hey to be notified of case closure.   Adriana Reams [facility] Care Management [phone]

## 2016-05-18 NOTE — Patient Outreach (Signed)
[facility] Customer service [facility]) Care Management [facility] Community CM Telephone [facility] Coordination  05/18/2016  OSYRIS VOLLRATH [birth year] [id number]  Unsuccessful telephone outreach to Kerby Nora, MD [phone]), PCP of "Marella Chimes y.o.malereferred to [facility] Community CM from [facility] telephonic RN CM for multiple ED visits and assessment/ evaluation of patient care needs. Patient has history including but not limited to MS, HTN, GERD, recurrent UTI secondary to permanent foley catheter placement. Patient has had 4 IP hospital admissions since 2017, and 11 ED visits resulting in discharge home (since 2017). Most recent ED visit was 05/11/16, where patient's permanent indwelling foley catheter was re-placed due to leaking.  Was sent to triage nurse Rena's voicemail; left Rena detailed voicemail message requesting call back regarding patient's/ caregiver's agreement to home health services, if agreed to by PCP; also made Rena aware of patient's issues with ongoing foley leakage and early signs of skin breakdown.  Provided patient's caregiver/ daughter's phone number and my direct phone number as call-back phone numbers.  Plan:  Will await follow up call from PCP/ office staff and assist as indicated.  Caryl Pina, RN, BSN, [employer] [facility] Care Management  [phone]

## 2016-05-18 NOTE — Patient Outreach (Signed)
[facility] Customer service manager [facility]) Care Management [facility] Community CM Telephone [facility] Coordination  05/18/2016  ISA CHONKO 1960-05-26 [id number]   Successful telephone outreach to "[facility]," step-daughter/ caregiver for "Travis Cantrell"E Simms,55 y.o.malereferred to [facility] Community CM from [facility] telephonic RN CM for multiple ED visits and assessment/ evaluation of patient care needs. Patient has history including but not limited to MS, HTN, GERD, recurrent UTI secondary to permanent foley catheter placement. Patient has had 4 IP hospital admissions since 2017, and 9 ED visits resulting in discharge home since 2017. HIPAA/ identity verified with patient's step-daughter during phone call today.   Call was placed today to update Chelsea on routine home visit made earlier this morning.  Earlier today during patient home visit, updated consent was obtained from patient to include Chelsea.  Informed Chelsea of the following:  -- patient has again apparently experienced a leakage of sterile saline in balloon of his permanent indwelling foley catheter; patient was found to be saturated in urine this morning, although foley catheter was intact/ in place.  Patient just had foley replaced on 05/11/16 in the ED.  Updated Chelsea that I re-inflated catheter balloon and ensured that patient was cleaned prior to leaving his home.  Chelsea confirms that she typically performs maintenance care of foley catheter, and states that normally, she replaces his foley catheter "about every 4 weeks."  Shared with [name] that this morning during assisting with patient cleaning, signs of skin breakdown were visualized on patient's sacrum, with reddened areas also noted on his abdomen (appeared to be excoriation).  Discussed with Chelsea strategies to decrease/ prevent skin breakdown, including proper nutrition, keeping patient clean/ dry, turned every 2 hours, and getting patient up out of bed and into wheelchair on a regular  basis; Chelsea verbalizes understanding and agreement.   -- patient is agreeable and expressed desire for home health [facility]) services for nursing for foley catheter/ skin care.  Chelsea shared that due to patient's insurance, she "is not sure" that [facility] services are covered unless patient has had hospital inpatient admission.  Discussed with Chelsea that I would place call to patient's PCP to facilitate MD order, and she was agreeable.  -- Confirmed that patient denied having heard back from [facility] about the issues he describes with his bed not functioning properly; updated Chelsea that I contacted [facility] supply again today and provided her telephone number to [facility] as a call-back.  Confirmed that [name] had the phone number for [facility].  Chelsea confirmed for me that she will be attending upcoming urology appointment with patient on Tuesday May 24, 2016, where she plans to inquire about placement of supra-pubic catheter.  Discussed with Chelsea my upcoming schedule and we agreed that I would contact her again in 2 weeks to obtain update.  Chelsea denies further issues, concerns, or problems today. I confirmed with her that she has my direct phone number should she wish to contact me prior to my next scheduled call-back.  Plan:   Patient will communicate care/ resource needs to [facility] RN CM and [facility] CSW as needed.  Patient will take medications as prescribed and will attend all scheduled provider appointments.  Patient will notify his medical providers for any new concerns, issues, or problems that arise.  [facility] Community CM outreach to continue with scheduled phone call in 2 weeks.  Caryl Pina, RN, BSN, [employer] [facility] Care Management  [phone]

## 2016-05-19 ENCOUNTER — Other Ambulatory Visit: Payer: Self-pay | Admitting: *Deleted
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
## 2016-05-19 NOTE — Telephone Encounter (Signed)
Agree with home health..  What do I need to do. Formal referral or are they assking for a verbal oder?

## 2016-05-19 NOTE — Patient Outreach (Signed)
[facility] Customer service manager [facility]) Care Management [facility] Community CM Telephone [facility] Coordination   05/19/2016  DEKKER VERGA 22-Mar-1960 [id number]  Successful telephone outreach from [place], [place] for Kerby Nora, MD [phone]), PCP of "Izaak, S[age] y.o.malereferred to [facility] Community CM from [facility] telephonic RN CM for multiple ED visits and assessment/ evaluation of patient care needs. Patient has history including but not limited to MS, HTN, GERD, recurrent UTI secondary to permanent foley catheter placement. Patient has had 4 IP hospital admissions since 2017, and 11 ED visits resulting in discharge home (since 2017). Most recent ED visit was 05/11/16, where patient's permanent indwelling foley catheter was re-placed due to leaking.  Lupita Leash shared that Dr. Ermalene Searing agrees with home health services for patient; updated Lupita Leash on home visit findings yesterday, clarified patient's issues with ongoing foley leakage and early signs of skin breakdown.  Provided patient's caregiver/ daughter's phone number and my direct phone number as call-back phone numbers.  Requested that PCP place home health order from office, and made Lupita Leash aware of my general schedule in the coming weeks.  Plan:  Will assist PCP/ office staff as indicated to facilitate HH orders.  Will communicate with patient's step-daughter [name] (as discussed with patient and Chelsea yesterday) with scheduled telephone outreach in 2 weeks.   Caryl Pina, RN, BSN, [employer] [facility] Care Management  [phone]

## 2016-05-20 ENCOUNTER — Other Ambulatory Visit: Payer: Self-pay | Admitting: *Deleted

## 2016-05-20 DIAGNOSIS — T839XXA Unspecified complication of genitourinary prosthetic device, implant and graft, initial encounter: Secondary | ICD-10-CM | POA: Insufficient documentation

## 2016-05-20 MED ORDER — IBUPROFEN 800 MG PO TABS
800.0000 mg | ORAL_TABLET | Freq: Three times a day (TID) | ORAL | 0 refills | Status: DC | PRN
Start: 2016-05-20 — End: 2016-05-31

## 2016-05-20 MED ORDER — DIAZEPAM 5 MG PO TABS: 5.0000 mg | ORAL_TABLET | Freq: Two times a day (BID) | ORAL | 0 refills | Status: DC

## 2016-05-20 MED ORDER — FLUOXETINE HCL 20 MG PO CAPS: ORAL_CAPSULE | ORAL | 1 refills | Status: DC

## 2016-05-20 NOTE — Telephone Encounter (Signed)
Diazepam called into [facility] - [place], [place] - [address] Phone: [phone] 

## 2016-05-20 NOTE — Telephone Encounter (Signed)
Last office visit 03/31/2016.  Last refilled 04/12/2016.  Ok to refill?

## 2016-05-26 ENCOUNTER — Telehealth: Payer: Self-pay

## 2016-05-26 ENCOUNTER — Other Ambulatory Visit: Payer: Self-pay | Admitting: *Deleted

## 2016-05-26 MED ORDER — LISINOPRIL 20 MG PO TABS: 20.0000 mg | ORAL_TABLET | Freq: Every day | ORAL | 1 refills | Status: DC

## 2016-05-26 NOTE — Telephone Encounter (Signed)
No emergent needs, that is okay.

## 2016-05-26 NOTE — Telephone Encounter (Signed)
Sammi.Neptune nurse with [facility] left v/m; home health PT cannot go to see pt until 05/30/16. Need to receive cb that is OK with Dr Ermalene Searing.

## 2016-05-26 NOTE — Telephone Encounter (Signed)
Pharmacy requested 90 day supply.

## 2016-05-27 ENCOUNTER — Emergency Department
Admission: EM | Admit: 2016-05-27 | Discharge: 2016-05-27 | Disposition: A | Discharge status: Home / Self Care | Payer: Medicare Other | Attending: Emergency Medicine | Admitting: Emergency Medicine

## 2016-05-27 ENCOUNTER — Encounter: Payer: Self-pay | Admitting: Emergency Medicine

## 2016-05-27 DIAGNOSIS — I10 Essential (primary) hypertension: Secondary | ICD-10-CM | POA: Insufficient documentation

## 2016-05-27 DIAGNOSIS — Z87891 Personal history of nicotine dependence: Secondary | ICD-10-CM | POA: Diagnosis not present

## 2016-05-27 DIAGNOSIS — Y69 Unspecified misadventure during surgical and medical care: Secondary | ICD-10-CM | POA: Diagnosis not present

## 2016-05-27 DIAGNOSIS — R1030 Lower abdominal pain, unspecified: Secondary | ICD-10-CM | POA: Diagnosis not present

## 2016-05-27 DIAGNOSIS — T839XXA Unspecified complication of genitourinary prosthetic device, implant and graft, initial encounter: Secondary | ICD-10-CM

## 2016-05-27 DIAGNOSIS — R109 Unspecified abdominal pain: Secondary | ICD-10-CM | POA: Diagnosis not present

## 2016-05-27 DIAGNOSIS — T83038A Leakage of other indwelling urethral catheter, initial encounter: Secondary | ICD-10-CM | POA: Insufficient documentation

## 2016-05-27 DIAGNOSIS — R6889 Other general symptoms and signs: Secondary | ICD-10-CM | POA: Diagnosis not present

## 2016-05-27 DIAGNOSIS — Z743 Need for continuous supervision: Secondary | ICD-10-CM | POA: Diagnosis not present

## 2016-05-27 LAB — URINALYSIS, COMPLETE (UACMP) WITH MICROSCOPIC
BACTERIA UA: NONE SEEN
Bilirubin Urine: NEGATIVE
GLUCOSE, UA: NEGATIVE mg/dL
KETONES UR: NEGATIVE mg/dL
Nitrite: POSITIVE — AB
PROTEIN: 100 mg/dL — AB
SQUAMOUS EPITHELIAL / LPF: NONE SEEN
Specific Gravity, Urine: 1.014 (ref 1.005–1.030)
pH: 7 (ref 5.0–8.0)

## 2016-05-27 NOTE — Discharge Instructions (Signed)
Please seek medical attention for any high fevers, chest pain, shortness of breath, change in behavior, persistent vomiting, bloody stool or any other new or concerning symptoms.  

## 2016-05-27 NOTE — ED Triage Notes (Addendum)
Ems pt from home , [facility]), pt arrived with suprapubic abd pain with indwelling cathetor, leaking around tip of penis, pt stated " I have a family member change it out about every two weeks, pt with MS and is able to mobilize himself with a lift and motorized wheelchair. Pt arrived dissolved with a gown that had dried stains, EMS reports pt was incontinent of stool at the scene

## 2016-05-27 NOTE — ED Notes (Signed)
Bladder scan results with 640 ml , urine leaking around cathetor and tip of penis. Removed indwelling urinary catheter (16 french) that pt arrived with removed 39ml of  fluid from the inflated bulb, pt tolerated well ,  Removed catheter holder that was attached to left upper thigh.  Peri care completed and cleansed , new 16 french urinary catheter inserted , with 350 ml of urine return

## 2016-05-27 NOTE — ED Notes (Signed)
Pt needs to back to home EMS. Will notify Diplomatic Services operational officer.

## 2016-05-27 NOTE — Telephone Encounter (Signed)
Keisa notified by telephone that it is okay to start [facility] on 05/30/2016 per Dr. Ermalene Searing.
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
## 2016-05-27 NOTE — ED Notes (Signed)
Pt denies any pain at this time. Catheter draining well. Patient repositioned.

## 2016-05-29 LAB — URINE CULTURE

## 2016-05-30 DIAGNOSIS — Z792 Long term (current) use of antibiotics: Secondary | ICD-10-CM | POA: Diagnosis not present

## 2016-05-30 DIAGNOSIS — G35 Multiple sclerosis: Secondary | ICD-10-CM | POA: Diagnosis not present

## 2016-05-30 DIAGNOSIS — T83031D Leakage of indwelling urethral catheter, subsequent encounter: Secondary | ICD-10-CM | POA: Diagnosis not present

## 2016-05-30 DIAGNOSIS — N390 Urinary tract infection, site not specified: Secondary | ICD-10-CM | POA: Diagnosis not present

## 2016-05-30 DIAGNOSIS — R7303 Prediabetes: Secondary | ICD-10-CM | POA: Diagnosis not present

## 2016-05-30 DIAGNOSIS — Z872 Personal history of diseases of the skin and subcutaneous tissue: Secondary | ICD-10-CM | POA: Diagnosis not present

## 2016-05-30 DIAGNOSIS — I10 Essential (primary) hypertension: Secondary | ICD-10-CM | POA: Diagnosis not present

## 2016-05-30 DIAGNOSIS — Z7401 Bed confinement status: Secondary | ICD-10-CM | POA: Diagnosis not present

## 2016-05-30 DIAGNOSIS — Z79891 Long term (current) use of opiate analgesic: Secondary | ICD-10-CM | POA: Diagnosis not present

## 2016-05-31 ENCOUNTER — Other Ambulatory Visit: Payer: Self-pay | Admitting: Family Medicine

## 2016-05-31 ENCOUNTER — Other Ambulatory Visit: Payer: Self-pay | Admitting: *Deleted

## 2016-05-31 ENCOUNTER — Encounter: Payer: Self-pay | Admitting: *Deleted

## 2016-05-31 NOTE — Telephone Encounter (Signed)
Last office visit 03/31/2016.  Ok to refill?
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
## 2016-06-03 DIAGNOSIS — N390 Urinary tract infection, site not specified: Secondary | ICD-10-CM | POA: Diagnosis not present

## 2016-06-03 DIAGNOSIS — Z79891 Long term (current) use of opiate analgesic: Secondary | ICD-10-CM | POA: Diagnosis not present

## 2016-06-03 DIAGNOSIS — R7303 Prediabetes: Secondary | ICD-10-CM | POA: Diagnosis not present

## 2016-06-03 DIAGNOSIS — G35 Multiple sclerosis: Secondary | ICD-10-CM | POA: Diagnosis not present

## 2016-06-03 DIAGNOSIS — Z872 Personal history of diseases of the skin and subcutaneous tissue: Secondary | ICD-10-CM | POA: Diagnosis not present

## 2016-06-03 DIAGNOSIS — Z792 Long term (current) use of antibiotics: Secondary | ICD-10-CM | POA: Diagnosis not present

## 2016-06-03 DIAGNOSIS — I10 Essential (primary) hypertension: Secondary | ICD-10-CM | POA: Diagnosis not present

## 2016-06-03 DIAGNOSIS — T83031D Leakage of indwelling urethral catheter, subsequent encounter: Secondary | ICD-10-CM | POA: Diagnosis not present

## 2016-06-03 DIAGNOSIS — Z7401 Bed confinement status: Secondary | ICD-10-CM | POA: Diagnosis not present

## 2016-06-04 ENCOUNTER — Emergency Department
Admission: EM | Admit: 2016-06-04 | Discharge: 2016-06-05 | Disposition: A | Discharge status: Home / Self Care | Payer: Medicare Other | Attending: Emergency Medicine | Admitting: Emergency Medicine

## 2016-06-04 ENCOUNTER — Encounter: Payer: Self-pay | Admitting: Emergency Medicine

## 2016-06-04 DIAGNOSIS — T83091A Other mechanical complication of indwelling urethral catheter, initial encounter: Secondary | ICD-10-CM

## 2016-06-04 DIAGNOSIS — I10 Essential (primary) hypertension: Secondary | ICD-10-CM | POA: Diagnosis not present

## 2016-06-04 DIAGNOSIS — Y69 Unspecified misadventure during surgical and medical care: Secondary | ICD-10-CM | POA: Diagnosis not present

## 2016-06-04 DIAGNOSIS — T83198A Other mechanical complication of other urinary devices and implants, initial encounter: Secondary | ICD-10-CM | POA: Diagnosis not present

## 2016-06-04 DIAGNOSIS — Z7982 Long term (current) use of aspirin: Secondary | ICD-10-CM | POA: Insufficient documentation

## 2016-06-04 DIAGNOSIS — Z791 Long term (current) use of non-steroidal anti-inflammatories (NSAID): Secondary | ICD-10-CM | POA: Insufficient documentation

## 2016-06-04 DIAGNOSIS — G35 Multiple sclerosis: Secondary | ICD-10-CM | POA: Diagnosis not present

## 2016-06-04 DIAGNOSIS — R1030 Lower abdominal pain, unspecified: Secondary | ICD-10-CM | POA: Diagnosis present

## 2016-06-04 DIAGNOSIS — Z79891 Long term (current) use of opiate analgesic: Secondary | ICD-10-CM | POA: Diagnosis not present

## 2016-06-04 DIAGNOSIS — T83098A Other mechanical complication of other indwelling urethral catheter, initial encounter: Secondary | ICD-10-CM | POA: Diagnosis not present

## 2016-06-04 DIAGNOSIS — Z87891 Personal history of nicotine dependence: Secondary | ICD-10-CM | POA: Diagnosis not present

## 2016-06-04 DIAGNOSIS — Z792 Long term (current) use of antibiotics: Secondary | ICD-10-CM | POA: Diagnosis not present

## 2016-06-04 DIAGNOSIS — Z7401 Bed confinement status: Secondary | ICD-10-CM | POA: Diagnosis not present

## 2016-06-04 DIAGNOSIS — R7303 Prediabetes: Secondary | ICD-10-CM | POA: Diagnosis not present

## 2016-06-04 DIAGNOSIS — N390 Urinary tract infection, site not specified: Secondary | ICD-10-CM | POA: Diagnosis not present

## 2016-06-04 DIAGNOSIS — T83031D Leakage of indwelling urethral catheter, subsequent encounter: Secondary | ICD-10-CM | POA: Diagnosis not present

## 2016-06-04 DIAGNOSIS — Z872 Personal history of diseases of the skin and subcutaneous tissue: Secondary | ICD-10-CM | POA: Diagnosis not present

## 2016-06-04 DIAGNOSIS — Z79899 Other long term (current) drug therapy: Secondary | ICD-10-CM | POA: Insufficient documentation

## 2016-06-04 LAB — URINALYSIS, COMPLETE (UACMP) WITH MICROSCOPIC
SPECIFIC GRAVITY, URINE: 1.034 — AB (ref 1.005–1.030)
Squamous Epithelial / LPF: NONE SEEN
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
## 2016-06-04 NOTE — ED Triage Notes (Signed)
Pt arrived by EMS with c/o abdominal pain d/t foley catheter that was clotted off.  Pt has distended abdomen and is c/o pressure pain across the abdomen.  The foley that the patient came in with had some residual clots in the foley tubing with no urine in the collection bag.

## 2016-06-05 DIAGNOSIS — T83091A Other mechanical complication of indwelling urethral catheter, initial encounter: Secondary | ICD-10-CM | POA: Diagnosis not present

## 2016-06-05 DIAGNOSIS — R339 Retention of urine, unspecified: Secondary | ICD-10-CM | POA: Diagnosis not present

## 2016-06-05 LAB — URINALYSIS, COMPLETE (UACMP) WITH MICROSCOPIC
SPECIFIC GRAVITY, URINE: 1.012 (ref 1.005–1.030)
Squamous Epithelial / LPF: NONE SEEN

## 2016-06-05 MED ORDER — CIPROFLOXACIN HCL 500 MG PO TABS: 500.0000 mg | ORAL_TABLET | Freq: Two times a day (BID) | ORAL | 0 refills | Status: AC

## 2016-06-05 NOTE — ED Notes (Signed)
Patient satting in high 80's placing on 2L [place].

## 2016-06-05 NOTE — Discharge Instructions (Signed)
Please take all of your antibiotics as prescribed and follow up with her primary care physician as needed. Return to the emergency department sooner for any new or worsening symptoms such as if your Foley catheter clogs again, felt fevers or chills, or for any other concerns whatsoever.  It was a pleasure to take care of you today, and thank you for coming to our emergency department.  If you have any questions or concerns before leaving please ask the nurse to grab me and I'm more than happy to go through your aftercare instructions again.  If you were prescribed any opioid pain medication today such as Norco, Vicodin, Percocet, morphine, hydrocodone, or oxycodone please make sure you do not drive when you are taking this medication as it can alter your ability to drive safely.  If you have any concerns once you are home that you are not improving or are in fact getting worse before you can make it to your follow-up appointment, please do not hesitate to call 911 and come back for further evaluation.  Merrily Brittle MD  Results for orders placed or performed during the hospital encounter of 06/04/16  Urinalysis, Complete w Microscopic  Result Value Ref Range   Color, Urine RED (A) YELLOW   APPearance TURBID (A) CLEAR   Specific Gravity, Urine 1.034 (H) 1.005 - 1.030   pH  5.0 - 8.0    TEST NOT REPORTED DUE TO COLOR INTERFERENCE OF URINE PIGMENT   Glucose, UA (A) NEGATIVE mg/dL    TEST NOT REPORTED DUE TO COLOR INTERFERENCE OF URINE PIGMENT   Hgb urine dipstick (A) NEGATIVE    TEST NOT REPORTED DUE TO COLOR INTERFERENCE OF URINE PIGMENT   Bilirubin Urine (A) NEGATIVE    TEST NOT REPORTED DUE TO COLOR INTERFERENCE OF URINE PIGMENT   Ketones, ur (A) NEGATIVE mg/dL    TEST NOT REPORTED DUE TO COLOR INTERFERENCE OF URINE PIGMENT   Protein, ur (A) NEGATIVE mg/dL    TEST NOT REPORTED DUE TO COLOR INTERFERENCE OF URINE PIGMENT   Nitrite (A) NEGATIVE    TEST NOT REPORTED DUE TO COLOR  INTERFERENCE OF URINE PIGMENT   Leukocytes, UA (A) NEGATIVE    TEST NOT REPORTED DUE TO COLOR INTERFERENCE OF URINE PIGMENT   RBC / HPF TOO NUMEROUS TO COUNT 0 - 5 RBC/hpf   WBC, UA TOO NUMEROUS TO COUNT 0 - 5 WBC/hpf   Bacteria, UA RARE (A) NONE SEEN   Squamous Epithelial / LPF NONE SEEN NONE SEEN  Urinalysis, Complete w Microscopic  Result Value Ref Range   Color, Urine RED (A) YELLOW   APPearance CLOUDY (A) CLEAR   Specific Gravity, Urine 1.012 1.005 - 1.030   pH  5.0 - 8.0    TEST NOT REPORTED DUE TO COLOR INTERFERENCE OF URINE PIGMENT   Glucose, UA (A) NEGATIVE mg/dL    TEST NOT REPORTED DUE TO COLOR INTERFERENCE OF URINE PIGMENT   Hgb urine dipstick (A) NEGATIVE    TEST NOT REPORTED DUE TO COLOR INTERFERENCE OF URINE PIGMENT   Bilirubin Urine (A) NEGATIVE    TEST NOT REPORTED DUE TO COLOR INTERFERENCE OF URINE PIGMENT   Ketones, ur (A) NEGATIVE mg/dL    TEST NOT REPORTED DUE TO COLOR INTERFERENCE OF URINE PIGMENT   Protein, ur (A) NEGATIVE mg/dL    TEST NOT REPORTED DUE TO COLOR INTERFERENCE OF URINE PIGMENT   Nitrite (A) NEGATIVE    TEST NOT REPORTED DUE TO COLOR INTERFERENCE OF URINE PIGMENT  Leukocytes, UA (A) NEGATIVE    TEST NOT REPORTED DUE TO COLOR INTERFERENCE OF URINE PIGMENT   RBC / HPF TOO NUMEROUS TO COUNT 0 - 5 RBC/hpf   WBC, UA TOO NUMEROUS TO COUNT 0 - 5 WBC/hpf   Bacteria, UA RARE (A) NONE SEEN   Squamous Epithelial / LPF NONE SEEN NONE SEEN

## 2016-06-05 NOTE — ED Notes (Signed)
Patient's phone was left in his room and I left a voice mail for [facility] to come to pick up the phone

## 2016-06-05 NOTE — ED Notes (Signed)
Patient's phone given to [place], [place]

## 2016-06-06 LAB — URINE CULTURE: Culture: <10000 — AB

## 2016-06-08 DIAGNOSIS — T83031D Leakage of indwelling urethral catheter, subsequent encounter: Secondary | ICD-10-CM | POA: Diagnosis not present

## 2016-06-08 DIAGNOSIS — Z79891 Long term (current) use of opiate analgesic: Secondary | ICD-10-CM | POA: Diagnosis not present

## 2016-06-08 DIAGNOSIS — Z792 Long term (current) use of antibiotics: Secondary | ICD-10-CM | POA: Diagnosis not present

## 2016-06-08 DIAGNOSIS — Z872 Personal history of diseases of the skin and subcutaneous tissue: Secondary | ICD-10-CM | POA: Diagnosis not present

## 2016-06-08 DIAGNOSIS — I10 Essential (primary) hypertension: Secondary | ICD-10-CM | POA: Diagnosis not present

## 2016-06-08 DIAGNOSIS — N390 Urinary tract infection, site not specified: Secondary | ICD-10-CM | POA: Diagnosis not present

## 2016-06-08 DIAGNOSIS — Z7401 Bed confinement status: Secondary | ICD-10-CM | POA: Diagnosis not present

## 2016-06-08 DIAGNOSIS — R7303 Prediabetes: Secondary | ICD-10-CM | POA: Diagnosis not present

## 2016-06-08 DIAGNOSIS — G35 Multiple sclerosis: Secondary | ICD-10-CM | POA: Diagnosis not present

## 2016-06-10 DIAGNOSIS — Z7401 Bed confinement status: Secondary | ICD-10-CM | POA: Diagnosis not present

## 2016-06-10 DIAGNOSIS — Z872 Personal history of diseases of the skin and subcutaneous tissue: Secondary | ICD-10-CM | POA: Diagnosis not present

## 2016-06-10 DIAGNOSIS — N390 Urinary tract infection, site not specified: Secondary | ICD-10-CM | POA: Diagnosis not present

## 2016-06-10 DIAGNOSIS — T83031D Leakage of indwelling urethral catheter, subsequent encounter: Secondary | ICD-10-CM | POA: Diagnosis not present

## 2016-06-10 DIAGNOSIS — G35 Multiple sclerosis: Secondary | ICD-10-CM | POA: Diagnosis not present

## 2016-06-10 DIAGNOSIS — Z792 Long term (current) use of antibiotics: Secondary | ICD-10-CM | POA: Diagnosis not present

## 2016-06-10 DIAGNOSIS — Z79891 Long term (current) use of opiate analgesic: Secondary | ICD-10-CM | POA: Diagnosis not present

## 2016-06-10 DIAGNOSIS — I10 Essential (primary) hypertension: Secondary | ICD-10-CM | POA: Diagnosis not present

## 2016-06-10 DIAGNOSIS — R7303 Prediabetes: Secondary | ICD-10-CM | POA: Diagnosis not present

## 2016-06-13 DIAGNOSIS — G35 Multiple sclerosis: Secondary | ICD-10-CM | POA: Diagnosis not present

## 2016-06-13 DIAGNOSIS — Z872 Personal history of diseases of the skin and subcutaneous tissue: Secondary | ICD-10-CM | POA: Diagnosis not present

## 2016-06-13 DIAGNOSIS — R7303 Prediabetes: Secondary | ICD-10-CM | POA: Diagnosis not present

## 2016-06-13 DIAGNOSIS — T83031D Leakage of indwelling urethral catheter, subsequent encounter: Secondary | ICD-10-CM | POA: Diagnosis not present

## 2016-06-13 DIAGNOSIS — I10 Essential (primary) hypertension: Secondary | ICD-10-CM | POA: Diagnosis not present

## 2016-06-13 DIAGNOSIS — Z7401 Bed confinement status: Secondary | ICD-10-CM | POA: Diagnosis not present

## 2016-06-13 DIAGNOSIS — Z792 Long term (current) use of antibiotics: Secondary | ICD-10-CM | POA: Diagnosis not present

## 2016-06-13 DIAGNOSIS — Z79891 Long term (current) use of opiate analgesic: Secondary | ICD-10-CM | POA: Diagnosis not present

## 2016-06-13 DIAGNOSIS — N390 Urinary tract infection, site not specified: Secondary | ICD-10-CM | POA: Diagnosis not present

## 2016-06-15 ENCOUNTER — Encounter: Payer: Self-pay | Admitting: *Deleted

## 2016-06-15 ENCOUNTER — Other Ambulatory Visit: Payer: Self-pay | Admitting: *Deleted

## 2016-06-15 NOTE — Patient Outreach (Signed)
[facility]) Care Management [facility] CM Telephone Outreach, Case Closure  06/15/2016  Travis Cantrell Nov 28, 1960 [id number]  Successful telephone outreach to "[facility]," step-daughter/ caregiver on [facility] CM written consent for"Travis Cantrell,55 y.o.malereferred to [facility] from [facility] telephonic RN CM for multiple ED visits and assessment/ evaluation of patient care needs. Patient has history including but not limited to MS, HTN, GERD, recurrent UTI secondary to permanent foley catheter placement. Patient has had 4 IP hospital admissions since 2017, and 11 ED visits resulting in discharge home since 2017. HIPAA/ identity verified with patient's step-daughterduring phone call today.   Call was placed today to get update from [facility] on patient status; Chelsea reports today:  -- [facility] RN services in place and "going well."  [place] RN coming for foley management/ skin assessment and management 2 x/ week x 8 weeks. [place] care agency:  [facility]; [name] confirms that she has phone number for [facility] agency.  -- [name] has had ongoing issues connecting with [facility] supply regarding patient's reports that his hospital bed is not inflating properly; Chelsea reports that she spoke with someone at [facility], and was told that they would waive the home visit fee and would have someone come out to troubleshoot the bed function; reports she is waiting to hear back regarding when they will go to patient's home to troubleshoot bed.  Confirms that she has the telephone number for [place] if she has additional questions/ concerns.  -- patient again experienced significant foley catheter leakage, "with some bleeding" which resulted in another ED visit 06/04/16; foley catheter was again replaced during ED visit.  -- scheduled urology appointment on June 28, 2016, to inquire about placement of supra-pubic catheter; Chelsea reports that she has taken the day  off from work to attend this appointment with patient.  Chelsea denies further questions about suprapubic catheter placement, and we discussed general risks/ benefits of procedure; Chelsea was encouraged to speak with urologist for specific details/ possibility of procedure.  -- Chelsea inquired about benefits available to patient for adult diapers, medical supplies; shared with her that through [facility], there is a benefit for patients for health products thorugh [facility] catalog; Chelsea was unaware of this benefit, and I provided basic information to her as to how to obtain a catalog from [facility].  Again reiterated with Chelsea strategies to decrease/ prevent patient skin breakdown, including proper nutrition, keeping patient clean/ dry, turned every 2 hours, and getting patient up out of bed and into wheelchair on a regular basis; Chelsea verbalizes understanding and agreement and stated several times during phone conversation that "things were looking better since [facility] RN" has been visiting patient regularly.  Chelseadenies further issues, concerns, or problems today.  Discussed with [name] that it appears as patient has met his previously established [facility] CCM goals, and [name] agrees, and denies further care coordination needs.  We discussed [facility] CM case closure, and [name] is agreeable to case closure.  I attempted to contact patient later in afternoon x 3 call attempts to discuss with him directly, but I received an automated outgoing voicemail message on his phone that indicated that patient's phone does not accept incoming calls; alternate number attempts were also unsuccessful, so I left voicemail messages with my call back number provided.  I then reached out again to [facility] to let her know I was unable to connect with patient directly, and left her a voice mail message.  Plan:   Will  close [facility] CM case as patient has met his previously established goals, and will make patient's PCP aware of same.  Oneta Rack, RN, BSN, [employer] [facility] Care Management  [phone]

## 2016-06-16 ENCOUNTER — Other Ambulatory Visit: Payer: Self-pay | Admitting: Family Medicine

## 2016-06-16 DIAGNOSIS — T83031D Leakage of indwelling urethral catheter, subsequent encounter: Secondary | ICD-10-CM | POA: Diagnosis not present

## 2016-06-16 DIAGNOSIS — Z79891 Long term (current) use of opiate analgesic: Secondary | ICD-10-CM | POA: Diagnosis not present

## 2016-06-16 DIAGNOSIS — Z872 Personal history of diseases of the skin and subcutaneous tissue: Secondary | ICD-10-CM | POA: Diagnosis not present

## 2016-06-16 DIAGNOSIS — N390 Urinary tract infection, site not specified: Secondary | ICD-10-CM | POA: Diagnosis not present

## 2016-06-16 DIAGNOSIS — G35 Multiple sclerosis: Secondary | ICD-10-CM | POA: Diagnosis not present

## 2016-06-16 DIAGNOSIS — Z792 Long term (current) use of antibiotics: Secondary | ICD-10-CM | POA: Diagnosis not present

## 2016-06-16 DIAGNOSIS — Z7401 Bed confinement status: Secondary | ICD-10-CM | POA: Diagnosis not present

## 2016-06-16 DIAGNOSIS — R7303 Prediabetes: Secondary | ICD-10-CM | POA: Diagnosis not present

## 2016-06-16 DIAGNOSIS — I10 Essential (primary) hypertension: Secondary | ICD-10-CM | POA: Diagnosis not present

## 2016-06-16 NOTE — Telephone Encounter (Signed)
Last office visit 03/31/2016.  Last refilled 05/20/2016 for #30 with no refills.  Ok to refill?

## 2016-06-17 NOTE — Telephone Encounter (Signed)
Diazepam called into [facility] - [place], [place] - [address] Phone: [phone] 

## 2016-06-20 DIAGNOSIS — T83031D Leakage of indwelling urethral catheter, subsequent encounter: Secondary | ICD-10-CM | POA: Diagnosis not present

## 2016-06-20 DIAGNOSIS — Z79891 Long term (current) use of opiate analgesic: Secondary | ICD-10-CM | POA: Diagnosis not present

## 2016-06-20 DIAGNOSIS — G35 Multiple sclerosis: Secondary | ICD-10-CM | POA: Diagnosis not present

## 2016-06-20 DIAGNOSIS — Z7401 Bed confinement status: Secondary | ICD-10-CM | POA: Diagnosis not present

## 2016-06-20 DIAGNOSIS — Z872 Personal history of diseases of the skin and subcutaneous tissue: Secondary | ICD-10-CM | POA: Diagnosis not present

## 2016-06-20 DIAGNOSIS — R7303 Prediabetes: Secondary | ICD-10-CM | POA: Diagnosis not present

## 2016-06-20 DIAGNOSIS — Z792 Long term (current) use of antibiotics: Secondary | ICD-10-CM | POA: Diagnosis not present

## 2016-06-20 DIAGNOSIS — N390 Urinary tract infection, site not specified: Secondary | ICD-10-CM | POA: Diagnosis not present

## 2016-06-20 DIAGNOSIS — I10 Essential (primary) hypertension: Secondary | ICD-10-CM | POA: Diagnosis not present

## 2016-06-23 DIAGNOSIS — Z792 Long term (current) use of antibiotics: Secondary | ICD-10-CM | POA: Diagnosis not present

## 2016-06-23 DIAGNOSIS — N390 Urinary tract infection, site not specified: Secondary | ICD-10-CM | POA: Diagnosis not present

## 2016-06-23 DIAGNOSIS — I10 Essential (primary) hypertension: Secondary | ICD-10-CM | POA: Diagnosis not present

## 2016-06-23 DIAGNOSIS — G35 Multiple sclerosis: Secondary | ICD-10-CM | POA: Diagnosis not present

## 2016-06-23 DIAGNOSIS — Z7401 Bed confinement status: Secondary | ICD-10-CM | POA: Diagnosis not present

## 2016-06-23 DIAGNOSIS — T83031D Leakage of indwelling urethral catheter, subsequent encounter: Secondary | ICD-10-CM | POA: Diagnosis not present

## 2016-06-23 DIAGNOSIS — R7303 Prediabetes: Secondary | ICD-10-CM | POA: Diagnosis not present

## 2016-06-23 DIAGNOSIS — Z872 Personal history of diseases of the skin and subcutaneous tissue: Secondary | ICD-10-CM | POA: Diagnosis not present

## 2016-06-23 DIAGNOSIS — Z79891 Long term (current) use of opiate analgesic: Secondary | ICD-10-CM | POA: Diagnosis not present

## 2016-06-28 DIAGNOSIS — N319 Neuromuscular dysfunction of bladder, unspecified: Secondary | ICD-10-CM | POA: Diagnosis not present

## 2016-06-29 DIAGNOSIS — Z79891 Long term (current) use of opiate analgesic: Secondary | ICD-10-CM | POA: Diagnosis not present

## 2016-06-29 DIAGNOSIS — N390 Urinary tract infection, site not specified: Secondary | ICD-10-CM | POA: Diagnosis not present

## 2016-06-29 DIAGNOSIS — Z872 Personal history of diseases of the skin and subcutaneous tissue: Secondary | ICD-10-CM | POA: Diagnosis not present

## 2016-06-29 DIAGNOSIS — I10 Essential (primary) hypertension: Secondary | ICD-10-CM | POA: Diagnosis not present

## 2016-06-29 DIAGNOSIS — Z792 Long term (current) use of antibiotics: Secondary | ICD-10-CM | POA: Diagnosis not present

## 2016-06-29 DIAGNOSIS — Z7401 Bed confinement status: Secondary | ICD-10-CM | POA: Diagnosis not present

## 2016-06-29 DIAGNOSIS — R7303 Prediabetes: Secondary | ICD-10-CM | POA: Diagnosis not present

## 2016-06-29 DIAGNOSIS — T83031D Leakage of indwelling urethral catheter, subsequent encounter: Secondary | ICD-10-CM | POA: Diagnosis not present

## 2016-06-29 DIAGNOSIS — G35 Multiple sclerosis: Secondary | ICD-10-CM | POA: Diagnosis not present

## 2016-07-10 ENCOUNTER — Other Ambulatory Visit: Payer: Self-pay | Admitting: Family Medicine

## 2016-07-11 NOTE — Telephone Encounter (Signed)
Last office visit 03/31/2016.  Last refilled Gabapentin 05/31/2016 for #360 with no refills. Diazepam 06/17/2016 for #30 with no refills.  Ibuprofen 05/31/2016 for #45 with no refills. Ok to refill?

## 2016-07-12 ENCOUNTER — Other Ambulatory Visit: Payer: Self-pay | Admitting: Family Medicine

## 2016-07-12 NOTE — Telephone Encounter (Signed)
Diazepam rx on vm at pharmacy

## 2016-07-28 ENCOUNTER — Other Ambulatory Visit: Payer: Self-pay | Admitting: Family Medicine

## 2016-07-28 NOTE — Telephone Encounter (Addendum)
Last office visit 03/31/2016.  Last refilled 07/11/2016 for #45 with no refills.  Pharmacy is requesting 90 day supply.  Ok to refill?

## 2016-08-05 DIAGNOSIS — L89152 Pressure ulcer of sacral region, stage 2: Secondary | ICD-10-CM | POA: Diagnosis not present

## 2016-08-10 ENCOUNTER — Other Ambulatory Visit: Payer: Self-pay | Admitting: Family Medicine

## 2016-08-10 NOTE — Telephone Encounter (Signed)
Last office visit 03/31/2016.  Last refilled 08/26/2015 for #270 with no refills.  Ok to refill?

## 2016-08-26 ENCOUNTER — Telehealth: Payer: Self-pay
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
## 2016-08-30 ENCOUNTER — Other Ambulatory Visit: Payer: Self-pay | Admitting: Family Medicine

## 2016-08-30 NOTE — Telephone Encounter (Signed)
Last office visit 03/31/2016.  Last refilled  Diazepam 07/11/2016 for #30 with no refills. Ibuprofen 07/29/2016 for #45 with no refills.  Ok to refill?

## 2016-08-30 NOTE — Telephone Encounter (Signed)
Rx called in to pharmacy. 

## 2016-09-14 ENCOUNTER — Other Ambulatory Visit: Payer: Self-pay | Admitting: Family Medicine

## 2016-09-14 NOTE — Telephone Encounter (Signed)
Last office visit 03/31/2016.  Last refilled Ibuprofen 08/30/2016 for #45 with no refills.  Gabapentin 08/25/2016 for #270 with no refills.  Ok to refill?

## 2016-09-28 ENCOUNTER — Telehealth: Payer: Self-pay | Admitting: Family Medicine

## 2016-09-28 NOTE — Telephone Encounter (Signed)
Recommend pt to call urologist

## 2016-09-28 NOTE — Telephone Encounter (Signed)
Mr. Gatlin notified by telephone to contact his Urologist per Dr. Ermalene Searing.

## 2016-09-28 NOTE — Telephone Encounter (Signed)
Patient Name: Travis Cantrell  DOB: 03-Sep-1960    Initial Comment [name] has a UTI, has a folly, was told to clean it out with water and vinegar, penis pain    Nurse Assessment  Nurse: Stefano Gaul, RN, Dwana Curd Date/Time (Eastern Time): 09/28/2016 10:10:44 AM  Confirm and document reason for call. If symptomatic, describe symptoms. ---[name] states he has a foley. He has frequent UTIs. He was told by the urologist to flush out his catheter with water and vinegar. He is having burning in his penis. Catheter is draining. No sediment. Catheter was changed yesterday.  Does the patient have any new or worsening symptoms? ---Yes  Will a triage be completed? ---Yes  Related visit to physician within the last 2 weeks? ---No  Does the PT have any chronic conditions? (i.e. diabetes, asthma, etc.) ---Yes  List chronic conditions. ---MS  Is this a behavioral health or substance abuse call? ---No     Guidelines    Guideline Title Affirmed Question Affirmed Notes  Penis and Scrotum Symptoms Pain or burning with passing urine    Final Disposition User   See Physician within 24 Hours [place], RN, Dwana Curd    Comments  pt states he has difficulty coming into the office for appt. Wants call back regarding burning in his penis.   Referrals  GO TO FACILITY REFUSED   Disagree/Comply: Disagree  Disagree/Comply Reason: Disagree with instructions

## 2016-09-29 ENCOUNTER — Emergency Department
Admission: EM | Admit: 2016-09-29 | Discharge: 2016-09-29 | Disposition: A | Discharge status: Home / Self Care | Payer: Medicare Other | Attending: Emergency Medicine | Admitting: Emergency Medicine

## 2016-09-29 ENCOUNTER — Other Ambulatory Visit: Payer: Self-pay | Admitting: Family Medicine

## 2016-09-29 DIAGNOSIS — N4889 Other specified disorders of penis: Secondary | ICD-10-CM | POA: Insufficient documentation

## 2016-09-29 DIAGNOSIS — R300 Dysuria: Secondary | ICD-10-CM | POA: Diagnosis present

## 2016-09-29 DIAGNOSIS — Z7401 Bed confinement status: Secondary | ICD-10-CM | POA: Diagnosis not present

## 2016-09-29 DIAGNOSIS — I10 Essential (primary) hypertension: Secondary | ICD-10-CM | POA: Insufficient documentation

## 2016-09-29 DIAGNOSIS — R102 Pelvic and perineal pain: Secondary | ICD-10-CM | POA: Diagnosis not present

## 2016-09-29 DIAGNOSIS — Z87891 Personal history of nicotine dependence: Secondary | ICD-10-CM | POA: Insufficient documentation

## 2016-09-29 DIAGNOSIS — G8220 Paraplegia, unspecified: Secondary | ICD-10-CM | POA: Diagnosis not present

## 2016-09-29 DIAGNOSIS — G35 Multiple sclerosis: Secondary | ICD-10-CM | POA: Diagnosis not present

## 2016-09-29 DIAGNOSIS — N3000 Acute cystitis without hematuria: Secondary | ICD-10-CM | POA: Diagnosis not present

## 2016-09-29 DIAGNOSIS — T83091A Other mechanical complication of indwelling urethral catheter, initial encounter: Secondary | ICD-10-CM | POA: Diagnosis not present

## 2016-09-29 LAB — BASIC METABOLIC PANEL
ANION GAP: 8 (ref 5–15)
BUN: 20 mg/dL (ref 6–20)
CALCIUM: 9.1 mg/dL (ref 8.9–10.3)
CO2: 24 mmol/L (ref 22–32)
Chloride: 107 mmol/L (ref 101–111)
Creatinine, Ser: 1.03 mg/dL (ref 0.61–1.24)
GLUCOSE: 100 mg/dL — AB (ref 65–99)
POTASSIUM: 4.3 mmol/L (ref 3.5–5.1)
Sodium: 139 mmol/L (ref 135–145)

## 2016-09-29 LAB — CBC
HEMATOCRIT: 44.8 % (ref 40.0–52.0)
Hemoglobin: 14.7 g/dL (ref 13.0–18.0)
MCH: 26 pg (ref 26.0–34.0)
MCHC: 32.9 g/dL (ref 32.0–36.0)
MCV: 79 fL — ABNORMAL LOW (ref 80.0–100.0)
Platelets: 329 10*3/uL (ref 150–440)
RBC: 5.67 MIL/uL (ref 4.40–5.90)
RDW: 15.5 % — AB (ref 11.5–14.5)
WBC: 14.4 10*3/uL — AB (ref 3.8–10.6)

## 2016-09-29 LAB — URINALYSIS, COMPLETE (UACMP) WITH MICROSCOPIC
BACTERIA UA: NONE SEEN
Bilirubin Urine: NEGATIVE
Glucose, UA: NEGATIVE mg/dL
Ketones, ur: NEGATIVE mg/dL
Nitrite: POSITIVE — AB
PROTEIN: 100 mg/dL — AB
SPECIFIC GRAVITY, URINE: 1.009 (ref 1.005–1.030)
pH: 6 (ref 5.0–8.0)

## 2016-09-29 MED ORDER — CEPHALEXIN 500 MG PO CAPS: 500.0000 mg | ORAL_CAPSULE | Freq: Four times a day (QID) | ORAL | 0 refills | Status: AC

## 2016-09-29 MED ORDER — LIDOCAINE HCL 2 % EX GEL
CUTANEOUS | Status: AC
  Administered 2016-09-29: 1 via URETHRAL
  Filled 2016-09-29: qty 10

## 2016-09-29 MED ORDER — DEXTROSE 5 % IV SOLN
1.0000 g | Freq: Once | INTRAVENOUS | Status: AC
  Administered 2016-09-29: 1 g via INTRAVENOUS
  Filled 2016-09-29: qty 10

## 2016-09-29 MED ORDER — LIDOCAINE HCL 2 % EX GEL
1.0000 "application " | Freq: Once | CUTANEOUS | Status: AC
  Administered 2016-09-29: 1 via URETHRAL

## 2016-09-29 MED ORDER — SODIUM CHLORIDE 0.9 % IV BOLUS (SEPSIS)
1000.0000 mL | Freq: Once | INTRAVENOUS | Status: AC
  Administered 2016-09-29: 1000 mL via INTRAVENOUS

## 2016-09-29 NOTE — Telephone Encounter (Signed)
Diazepam called into [facility] [address] [place], [place] - [address] Phone: [phone]
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
## 2016-09-29 NOTE — ED Triage Notes (Signed)
Pt brought in from home by [facility].  Pt states that he has been having burning at foley site x2 days.  Spoke with urologist in [place] and was instructed to flush with 50/50 solution of vinegar and water.  Pt states caregiver did this with no relief.  Pt states pain 8/10 at this time.  Pt also out of htn medication, but states that the prescription should be ready at pharmacy.  Pt is bed bound and has hx of MS.  Otherwise A&Ox4 and in NAD at this time.

## 2016-09-29 NOTE — ED Notes (Signed)
Foley catheter placed.

## 2016-09-29 NOTE — ED Notes (Signed)
Pt unable discharge paperwork due to contractures

## 2016-09-29 NOTE — Telephone Encounter (Signed)
Last office visit 03/31/2016.  Last refilled 08/30/2016 for #30 with 1 refill.  Ok to refill?

## 2016-09-29 NOTE — Discharge Instructions (Signed)
Drink plenty of fluid to stay well hydrated. Take the entire course of antibiotics, even if you're feeling better. Please make a follow-up appointment with your regular doctor, and have them check the results of your urine culture which was sent today.  Return to the emergency department if you develop severe pain, nausea or vomiting, generalized weakness, fever, or any other symptoms concerning to you.

## 2016-09-30 ENCOUNTER — Telehealth: Payer: Self-pay

## 2016-09-30 ENCOUNTER — Encounter: Payer: Self-pay | Admitting: *Deleted

## 2016-09-30 LAB — URINE CULTURE

## 2016-09-30 NOTE — Telephone Encounter (Signed)
Scarlette at [facility] notified last office visit with Dr. Ermalene Searing was 03/31/2016.  Form is in Dr. Daphine Deutscher in box for review.  They are unable to take on patient until he has a face to face with Dr. Ermalene Searing.  Scarlette states they have spoken with patient and daughter and advised them of this on a couple of occasion.  She states if he every does come in for an appointment, then just let them know and they would be happy to take him on as a patient.

## 2016-09-30 NOTE — Telephone Encounter (Signed)
Scarlette nurse with [facility] left v/m to f/u on fax that was sent on 09/29/16 about starting pt on home health. Pt needs to have had face to face with  Dr Ermalene Searing within last 90 days. Scarlette request status of fax.

## 2016-09-30 NOTE — Telephone Encounter (Signed)
Chelsea notified as instructed by telephone.  She states Mr. Paulsen would not be able to come in for office visit until first of October due to not having money to pay his co-pay.  Appointment scheduled for 10/02//2018 at 1:15 pm with Dr. Ermalene Searing.  Chelsea states she will try to come to the appointment with Mr. Murri.

## 2016-09-30 NOTE — Telephone Encounter (Signed)
Noted. He was recently in ER.Marland Kitchen Needs follow up. Call to schedule and we can do both at same time.

## 2016-10-09 ENCOUNTER — Other Ambulatory Visit: Payer: Self-pay | Admitting: Family Medicine

## 2016-10-09 NOTE — Telephone Encounter (Signed)
Last office visit 03-31-16

## 2016-10-09 NOTE — Telephone Encounter (Signed)
Last office visit 03/31/16.  Last refilled Gabapentin 09/15/2016 for #360 with no refills. Ibuprofen 09/15/16 for #45 with nno refills.  Ok to refill?

## 2016-10-10 ENCOUNTER — Other Ambulatory Visit: Payer: Self-pay | Admitting: Family Medicine

## 2016-10-20 ENCOUNTER — Telehealth: Payer: Self-pay

## 2016-10-20 NOTE — Telephone Encounter (Signed)
Travis Cantrell pts daughter who is also a Engineer, civil (consulting) (Do not see DPR signed). Pt gets recurrent UTIs and pt was advised to go to urologist but [name] noted that urologist tells pt to drink water and bolus flush with vinegar and water tid. When pt has bolus flush and pt gets another UTI. [name] is concerned about bolus flush; If Dr Ermalene Searing needs urinalysis or c/s [name] can do in and out cath but would need in and out cath tray order faxed to [facility]. Travis Cantrell request cb. Pt has an appt with Dr Ermalene Searing on 10/28/16 for ED f/u.

## 2016-10-20 NOTE — Telephone Encounter (Signed)
Chelsea states [name] is currently being treated for UTI.  She is just wanting an order  for In and Out Cath Kits faxed to [facility]  so she can keep some on hand at home to use when the need arises.  Will have Dr. Ermalene Searing write Rx for cath kits on Friday when she returns to clinic.

## 2016-10-20 NOTE — Telephone Encounter (Signed)
Left message for Travis Cantrell that if Mr. River is having UTI symptoms, we will need an in and out cath sample.  She can either come by our office and pick up cath kit or I can fax order to [facility] which ever is easier for her.

## 2016-10-20 NOTE — Telephone Encounter (Signed)
If patient with symptoms of UTI.Marland Kitchen Have her bring in urine sample for U culture from in and out cath.

## 2016-10-21 NOTE — Telephone Encounter (Signed)
In outbox

## 2016-10-21 NOTE — Telephone Encounter (Signed)
Order faxed to [facility] [phone].

## 2016-10-25 ENCOUNTER — Ambulatory Visit: Payer: Medicare Other | Admitting: Family Medicine

## 2016-10-28 ENCOUNTER — Ambulatory Visit (INDEPENDENT_AMBULATORY_CARE_PROVIDER_SITE_OTHER): Payer: Medicare Other | Admitting: Family Medicine

## 2016-10-28 ENCOUNTER — Encounter: Payer: Self-pay | Admitting: Family Medicine

## 2016-10-28 VITALS — BP 97/81 | HR 132 | Temp 99.5°F

## 2016-10-28 DIAGNOSIS — T839XXS Unspecified complication of genitourinary prosthetic device, implant and graft, sequela: Secondary | ICD-10-CM

## 2016-10-28 DIAGNOSIS — N390 Urinary tract infection, site not specified: Secondary | ICD-10-CM

## 2016-10-28 DIAGNOSIS — N319 Neuromuscular dysfunction of bladder, unspecified: Secondary | ICD-10-CM

## 2016-10-28 DIAGNOSIS — G35 Multiple sclerosis: Secondary | ICD-10-CM

## 2016-10-28 HISTORY — DX: Neuromuscular dysfunction of bladder, unspecified: N31.9

## 2016-10-28 NOTE — Assessment & Plan Note (Signed)
bedbound/ chair bound.  Home health assistance needed with PT to try to improve strenght as well as skilled nursing for foley catheter teaching and assistance with training on flushing catheters.

## 2016-10-28 NOTE — Assessment & Plan Note (Addendum)
Pt is struggling with repeated clogging of foley catheter due to excessive urinary sediment.

## 2016-10-28 NOTE — Assessment & Plan Note (Signed)
I and out cath today given foley not in place (pt ran out) and bladder filling... Unable to drain any urine from bladder. Pt to get foley catheters soon.  Can use  I and out cath temporarily until arrives... Pt has prescription already.

## 2016-10-28 NOTE — Progress Notes (Signed)
Subjective:    Patient ID: Travis Cantrell, male    DOB: [birth year], 56 y.o.   MRN: [id number]  HPI  56 year old male with progressive MS.. Wheel chair bound with indwelling cathter presents for ER follow up.  He has recurrent UTIs. But recent ER visit culture showed multiple species. He reports he ran out of catheters and does not currently have one in place.  No fever or confusion, no dysuria. No abdominal pain. He feels he needs a precrispttion for antibiotics given he has excessive sediment that gums up catheter.. Making him to change to often. He cannot tolerate vinegar and water flushes.. Causes extreme pain. He is open to changing to I and Out caths instead of indwelling cath. He has some one at home who could do this for him.. Replaces the foley catheter for him already.  Dr. Ward Chatters  He also needs face to face assessment for home health for a mobility assessment.   He is interested in some physical therapy and PT.  Skilled nurse needs may be needed.. Need eval and treat. Pt is wheel chair bound and unable to transfer on own.   Review of Systems  Constitutional: Negative for fatigue and fever.  HENT: Negative for ear pain.   Eyes: Negative for pain.  Respiratory: Negative for cough and shortness of breath.   Cardiovascular: Negative for chest pain, palpitations and leg swelling.  Gastrointestinal: Negative for abdominal pain.  Genitourinary: Negative for dysuria.  Musculoskeletal: Negative for arthralgias.  Neurological: Negative for syncope, light-headedness and headaches.  Psychiatric/Behavioral: Negative for dysphoric mood.       Objective:   Physical Exam  Constitutional: He is oriented to person, place, and time. Vital signs are normal. He appears well-developed and well-nourished.  HENT:  Head: Normocephalic.  Right Ear: Hearing normal. No middle ear effusion.  Left Ear: Hearing normal.  No middle ear effusion.  Nose: Nose normal. No rhinorrhea.  Mouth/Throat:  Oropharynx is clear and moist and mucous membranes are normal. No posterior oropharyngeal edema or posterior oropharyngeal erythema.  Eyes: Pupils are equal, round, and reactive to light. Conjunctivae, EOM and lids are normal.  Neck: Trachea normal. No spinous process tenderness and no muscular tenderness present. Carotid bruit is not present. Decreased range of motion present. No erythema present. No thyroid mass and no thyromegaly present.  Cardiovascular: Normal rate, regular rhythm and normal pulses.  Exam reveals no gallop, no distant heart sounds and no friction rub.   No murmur heard. Bilateral peripheral edema in feet  Pulmonary/Chest: Effort normal and breath sounds normal. No respiratory distress.  Abdominal: There is tenderness in the suprapubic area.  Full bladder  Musculoskeletal:       Right shoulder: He exhibits decreased range of motion.       Left shoulder: He exhibits decreased range of motion.       Right elbow: He exhibits decreased range of motion.       Left elbow: He exhibits decreased range of motion.       Cervical back: He exhibits decreased range of motion.       Thoracic back: He exhibits decreased range of motion.       Lumbar back: He exhibits decreased range of motion.   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 He displays atrophy. He displays no tremor. No cranial nerve deficit or sensory deficit. He exhibits abnormal muscle tone. Gait abnormal. Coordination  normal.  Upper ext L: shoulder int and ext rot, abdand add: 1/5 Otherwise 0/5 R:shoulder int and ext rot, abdand add: 1/5 Left grip strength 1/5 1/5 wrist  Flexion, 0/5 wrist extension Otherwise 0/5 Lower ext  L:0/5 entire extremity R: 0/5 lower extremity  Skin: Skin is warm, dry and intact. No rash noted.  Very dry flaky skin  Psychiatric: He has a normal mood and affect. His speech is normal and behavior is normal. Thought  content normal.          Assessment & Plan:

## 2016-10-28 NOTE — Patient Instructions (Addendum)
We will get home health for assessment and treatment. I will call once I speak with Dr. Harlin Rain about the catheter situation.

## 2016-10-29 ENCOUNTER — Other Ambulatory Visit: Payer: Self-pay | Admitting: Family Medicine

## 2016-10-30 NOTE — Telephone Encounter (Signed)
Last office with 10/28/16  Last refilled diazepam-09/29/16 for #30 with 1 refill. Ibuprofen-10/10/16 for #45 with no refills.  Pharmacy is requesting 90 day supply.  Refill?

## 2016-10-31 ENCOUNTER — Telehealth: Payer: Self-pay | Admitting: Family Medicine

## 2016-10-31 MED ORDER — DIAZEPAM 5 MG PO TABS: 5.0000 mg | ORAL_TABLET | Freq: Two times a day (BID) | ORAL | 0 refills | Status: DC | PRN

## 2016-10-31 MED ORDER — IBUPROFEN 800 MG PO TABS: 800.0000 mg | ORAL_TABLET | Freq: Three times a day (TID) | ORAL | 0 refills | Status: DC | PRN

## 2016-10-31 NOTE — Telephone Encounter (Signed)
Dr.Ottelin returned Dr.Bedsole's call.  Dr.Ottelin was told Dr.Bedsole is off until tomorrow. Dr.Ottelin will be off the rest of the week. Dr.Ottelin said he knew the reason for Dr.Bedsole's call and to let her know he has been working with patient for a long time and there's another option and he'll have patient come in to try it.

## 2016-10-31 NOTE — Telephone Encounter (Signed)
Okay to refill at 90 day supply.. Please change and resend/ call prescriptions in for 90 days.

## 2016-10-31 NOTE — Addendum Note (Signed)
Addended by: Damita Lack on: 10/31/2016 03:09 PM   Modules accepted: Orders

## 2016-10-31 NOTE — Telephone Encounter (Signed)
Diazepam called into [facility] on [address].

## 2016-11-05 DIAGNOSIS — I10 Essential (primary) hypertension: Secondary | ICD-10-CM | POA: Diagnosis not present

## 2016-11-05 DIAGNOSIS — Z9181 History of falling: Secondary | ICD-10-CM | POA: Diagnosis not present

## 2016-11-05 DIAGNOSIS — L219 Seborrheic dermatitis, unspecified: Secondary | ICD-10-CM | POA: Diagnosis not present

## 2016-11-05 DIAGNOSIS — Z87440 Personal history of urinary (tract) infections: Secondary | ICD-10-CM | POA: Diagnosis not present

## 2016-11-05 DIAGNOSIS — K219 Gastro-esophageal reflux disease without esophagitis: Secondary | ICD-10-CM | POA: Diagnosis not present

## 2016-11-05 DIAGNOSIS — N319 Neuromuscular dysfunction of bladder, unspecified: Secondary | ICD-10-CM | POA: Diagnosis not present

## 2016-11-05 DIAGNOSIS — G35 Multiple sclerosis: Secondary | ICD-10-CM | POA: Diagnosis not present

## 2016-11-05 DIAGNOSIS — Z87891 Personal history of nicotine dependence: Secondary | ICD-10-CM | POA: Diagnosis not present

## 2016-11-05 DIAGNOSIS — Z466 Encounter for fitting and adjustment of urinary device: Secondary | ICD-10-CM | POA: Diagnosis not present

## 2016-11-07 DIAGNOSIS — L89152 Pressure ulcer of sacral region, stage 2: Secondary | ICD-10-CM | POA: Diagnosis not present

## 2016-11-08 DIAGNOSIS — Z466 Encounter for fitting and adjustment of urinary device: Secondary | ICD-10-CM | POA: Diagnosis not present

## 2016-11-08 DIAGNOSIS — G35 Multiple sclerosis: Secondary | ICD-10-CM | POA: Diagnosis not present

## 2016-11-08 DIAGNOSIS — L219 Seborrheic dermatitis, unspecified: Secondary | ICD-10-CM | POA: Diagnosis not present

## 2016-11-08 DIAGNOSIS — Z87440 Personal history of urinary (tract) infections: Secondary | ICD-10-CM | POA: Diagnosis not present

## 2016-11-08 DIAGNOSIS — Z87891 Personal history of nicotine dependence: Secondary | ICD-10-CM | POA: Diagnosis not present

## 2016-11-08 DIAGNOSIS — Z9181 History of falling: Secondary | ICD-10-CM | POA: Diagnosis not present

## 2016-11-08 DIAGNOSIS — L89152 Pressure ulcer of sacral region, stage 2: Secondary | ICD-10-CM | POA: Diagnosis not present

## 2016-11-08 DIAGNOSIS — N319 Neuromuscular dysfunction of bladder, unspecified: Secondary | ICD-10-CM | POA: Diagnosis not present

## 2016-11-08 DIAGNOSIS — K219 Gastro-esophageal reflux disease without esophagitis: Secondary | ICD-10-CM | POA: Diagnosis not present

## 2016-11-08 DIAGNOSIS — I10 Essential (primary) hypertension: Secondary | ICD-10-CM | POA: Diagnosis not present

## 2016-11-09 ENCOUNTER — Telehealth: Payer: Self-pay

## 2016-11-09 DIAGNOSIS — Z466 Encounter for fitting and adjustment of urinary device: Secondary | ICD-10-CM | POA: Diagnosis not present

## 2016-11-09 DIAGNOSIS — K219 Gastro-esophageal reflux disease without esophagitis: Secondary | ICD-10-CM | POA: Diagnosis not present

## 2016-11-09 DIAGNOSIS — Z87891 Personal history of nicotine dependence: Secondary | ICD-10-CM | POA: Diagnosis not present

## 2016-11-09 DIAGNOSIS — Z87440 Personal history of urinary (tract) infections: Secondary | ICD-10-CM | POA: Diagnosis not present

## 2016-11-09 DIAGNOSIS — Z9181 History of falling: Secondary | ICD-10-CM | POA: Diagnosis not present

## 2016-11-09 DIAGNOSIS — I10 Essential (primary) hypertension: Secondary | ICD-10-CM | POA: Diagnosis not present

## 2016-11-09 DIAGNOSIS — G35 Multiple sclerosis: Secondary | ICD-10-CM | POA: Diagnosis not present

## 2016-11-09 DIAGNOSIS — L219 Seborrheic dermatitis, unspecified: Secondary | ICD-10-CM | POA: Diagnosis not present

## 2016-11-09 DIAGNOSIS — N319 Neuromuscular dysfunction of bladder, unspecified: Secondary | ICD-10-CM | POA: Diagnosis not present

## 2016-11-09 NOTE — Telephone Encounter (Signed)
Left message for Travis Cantrell giving her verbal orders for [facility] PT 2 x a week for 2 weeks and 1 x a week for 2 weeks.

## 2016-11-09 NOTE — Telephone Encounter (Signed)
[name] with [facility] left v/m requesting [facility] a [facility] for 2 weeks and 1 x a week for 2 weeks.

## 2016-11-11 DIAGNOSIS — Z87440 Personal history of urinary (tract) infections: Secondary | ICD-10-CM | POA: Diagnosis not present

## 2016-11-11 DIAGNOSIS — I10 Essential (primary) hypertension: Secondary | ICD-10-CM | POA: Diagnosis not present

## 2016-11-11 DIAGNOSIS — N319 Neuromuscular dysfunction of bladder, unspecified: Secondary | ICD-10-CM | POA: Diagnosis not present

## 2016-11-11 DIAGNOSIS — Z466 Encounter for fitting and adjustment of urinary device: Secondary | ICD-10-CM | POA: Diagnosis not present

## 2016-11-11 DIAGNOSIS — L219 Seborrheic dermatitis, unspecified: Secondary | ICD-10-CM | POA: Diagnosis not present

## 2016-11-11 DIAGNOSIS — K219 Gastro-esophageal reflux disease without esophagitis: Secondary | ICD-10-CM | POA: Diagnosis not present

## 2016-11-11 DIAGNOSIS — G35 Multiple sclerosis: Secondary | ICD-10-CM | POA: Diagnosis not present

## 2016-11-11 DIAGNOSIS — Z87891 Personal history of nicotine dependence: Secondary | ICD-10-CM | POA: Diagnosis not present

## 2016-11-11 DIAGNOSIS — Z9181 History of falling: Secondary | ICD-10-CM | POA: Diagnosis not present

## 2016-11-16 DIAGNOSIS — G35 Multiple sclerosis: Secondary | ICD-10-CM | POA: Diagnosis not present

## 2016-11-16 DIAGNOSIS — I10 Essential (primary) hypertension: Secondary | ICD-10-CM | POA: Diagnosis not present

## 2016-11-16 DIAGNOSIS — Z87891 Personal history of nicotine dependence: Secondary | ICD-10-CM | POA: Diagnosis not present

## 2016-11-16 DIAGNOSIS — K219 Gastro-esophageal reflux disease without esophagitis: Secondary | ICD-10-CM | POA: Diagnosis not present

## 2016-11-16 DIAGNOSIS — N319 Neuromuscular dysfunction of bladder, unspecified: Secondary | ICD-10-CM | POA: Diagnosis not present

## 2016-11-16 DIAGNOSIS — L219 Seborrheic dermatitis, unspecified: Secondary | ICD-10-CM | POA: Diagnosis not present

## 2016-11-16 DIAGNOSIS — Z87440 Personal history of urinary (tract) infections: Secondary | ICD-10-CM | POA: Diagnosis not present

## 2016-11-16 DIAGNOSIS — Z466 Encounter for fitting and adjustment of urinary device: Secondary | ICD-10-CM | POA: Diagnosis not present

## 2016-11-16 DIAGNOSIS — Z9181 History of falling: Secondary | ICD-10-CM | POA: Diagnosis not present

## 2016-11-18 DIAGNOSIS — Z87891 Personal history of nicotine dependence: Secondary | ICD-10-CM | POA: Diagnosis not present

## 2016-11-18 DIAGNOSIS — L219 Seborrheic dermatitis, unspecified: Secondary | ICD-10-CM | POA: Diagnosis not present

## 2016-11-18 DIAGNOSIS — Z9181 History of falling: Secondary | ICD-10-CM | POA: Diagnosis not present

## 2016-11-18 DIAGNOSIS — Z87440 Personal history of urinary (tract) infections: Secondary | ICD-10-CM | POA: Diagnosis not present

## 2016-11-18 DIAGNOSIS — G35 Multiple sclerosis: Secondary | ICD-10-CM | POA: Diagnosis not present

## 2016-11-18 DIAGNOSIS — N319 Neuromuscular dysfunction of bladder, unspecified: Secondary | ICD-10-CM | POA: Diagnosis not present

## 2016-11-18 DIAGNOSIS — Z466 Encounter for fitting and adjustment of urinary device: Secondary | ICD-10-CM | POA: Diagnosis not present

## 2016-11-18 DIAGNOSIS — I10 Essential (primary) hypertension: Secondary | ICD-10-CM | POA: Diagnosis not present

## 2016-11-18 DIAGNOSIS — K219 Gastro-esophageal reflux disease without esophagitis: Secondary | ICD-10-CM | POA: Diagnosis not present

## 2016-11-21 ENCOUNTER — Other Ambulatory Visit: Payer: Self-pay | Admitting: Family Medicine

## 2016-11-22 ENCOUNTER — Telehealth: Payer: Self-pay | Admitting: Family Medicine

## 2016-11-22 DIAGNOSIS — Z87440 Personal history of urinary (tract) infections: Secondary | ICD-10-CM | POA: Diagnosis not present

## 2016-11-22 DIAGNOSIS — N319 Neuromuscular dysfunction of bladder, unspecified: Secondary | ICD-10-CM | POA: Diagnosis not present

## 2016-11-22 DIAGNOSIS — I10 Essential (primary) hypertension: Secondary | ICD-10-CM | POA: Diagnosis not present

## 2016-11-22 DIAGNOSIS — Z9181 History of falling: Secondary | ICD-10-CM | POA: Diagnosis not present

## 2016-11-22 DIAGNOSIS — Z466 Encounter for fitting and adjustment of urinary device: Secondary | ICD-10-CM | POA: Diagnosis not present

## 2016-11-22 DIAGNOSIS — Z87891 Personal history of nicotine dependence: Secondary | ICD-10-CM | POA: Diagnosis not present

## 2016-11-22 DIAGNOSIS — L219 Seborrheic dermatitis, unspecified: Secondary | ICD-10-CM | POA: Diagnosis not present

## 2016-11-22 DIAGNOSIS — K219 Gastro-esophageal reflux disease without esophagitis: Secondary | ICD-10-CM | POA: Diagnosis not present

## 2016-11-22 DIAGNOSIS — G35 Multiple sclerosis: Secondary | ICD-10-CM | POA: Diagnosis not present

## 2016-11-22 NOTE — Telephone Encounter (Signed)
Copied from CRM [phone]. Topic: Quick Communication - See Telephone Encounter >> Nov 22, 2016  4:06 PM Diana EvesHoyt, Maryann B wrote: CRM for notification. See Telephone encounter for:  11/22/16. OT verbal from [facility] [phone]. A verbal for eval and treat for splints or resting splints
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
## 2016-11-22 NOTE — Telephone Encounter (Signed)
Last office visit 10/28/2016.  Last refilled Diazepam-10/31/2016 for #90 with no refills.  Gabapentin-10/10/2016 for #360 with no refills.  Ok to refill?

## 2016-11-22 NOTE — Telephone Encounter (Signed)
Too early for diazepam refill.. Call to see if pt using more than instructions BID.

## 2016-11-22 NOTE — Telephone Encounter (Signed)
Please call and given verbal order as requested.

## 2016-11-23 DIAGNOSIS — Z9181 History of falling: Secondary | ICD-10-CM | POA: Diagnosis not present

## 2016-11-23 DIAGNOSIS — Z87891 Personal history of nicotine dependence: Secondary | ICD-10-CM | POA: Diagnosis not present

## 2016-11-23 DIAGNOSIS — G35 Multiple sclerosis: Secondary | ICD-10-CM | POA: Diagnosis not present

## 2016-11-23 DIAGNOSIS — L219 Seborrheic dermatitis, unspecified: Secondary | ICD-10-CM | POA: Diagnosis not present

## 2016-11-23 DIAGNOSIS — K219 Gastro-esophageal reflux disease without esophagitis: Secondary | ICD-10-CM | POA: Diagnosis not present

## 2016-11-23 DIAGNOSIS — Z87440 Personal history of urinary (tract) infections: Secondary | ICD-10-CM | POA: Diagnosis not present

## 2016-11-23 DIAGNOSIS — N319 Neuromuscular dysfunction of bladder, unspecified: Secondary | ICD-10-CM | POA: Diagnosis not present

## 2016-11-23 DIAGNOSIS — Z466 Encounter for fitting and adjustment of urinary device: Secondary | ICD-10-CM | POA: Diagnosis not present

## 2016-11-23 DIAGNOSIS — I10 Essential (primary) hypertension: Secondary | ICD-10-CM | POA: Diagnosis not present

## 2016-11-23 NOTE — Telephone Encounter (Signed)
Verbal order given to Christian with [facility] for eval and treat for splints or resting splints.

## 2016-11-24 DIAGNOSIS — I10 Essential (primary) hypertension: Secondary | ICD-10-CM | POA: Diagnosis not present

## 2016-11-24 DIAGNOSIS — Z466 Encounter for fitting and adjustment of urinary device: Secondary | ICD-10-CM | POA: Diagnosis not present

## 2016-11-24 DIAGNOSIS — Z87891 Personal history of nicotine dependence: Secondary | ICD-10-CM | POA: Diagnosis not present

## 2016-11-24 DIAGNOSIS — Z9181 History of falling: Secondary | ICD-10-CM | POA: Diagnosis not present

## 2016-11-24 DIAGNOSIS — Z87440 Personal history of urinary (tract) infections: Secondary | ICD-10-CM | POA: Diagnosis not present

## 2016-11-24 DIAGNOSIS — L219 Seborrheic dermatitis, unspecified: Secondary | ICD-10-CM | POA: Diagnosis not present

## 2016-11-24 DIAGNOSIS — N319 Neuromuscular dysfunction of bladder, unspecified: Secondary | ICD-10-CM | POA: Diagnosis not present

## 2016-11-24 DIAGNOSIS — K219 Gastro-esophageal reflux disease without esophagitis: Secondary | ICD-10-CM | POA: Diagnosis not present

## 2016-11-24 DIAGNOSIS — G35 Multiple sclerosis: Secondary | ICD-10-CM | POA: Diagnosis not present

## 2016-11-30 DIAGNOSIS — Z9181 History of falling: Secondary | ICD-10-CM | POA: Diagnosis not present

## 2016-11-30 DIAGNOSIS — G35 Multiple sclerosis: Secondary | ICD-10-CM | POA: Diagnosis not present

## 2016-11-30 DIAGNOSIS — Z466 Encounter for fitting and adjustment of urinary device: Secondary | ICD-10-CM | POA: Diagnosis not present

## 2016-11-30 DIAGNOSIS — K219 Gastro-esophageal reflux disease without esophagitis: Secondary | ICD-10-CM | POA: Diagnosis not present

## 2016-11-30 DIAGNOSIS — N319 Neuromuscular dysfunction of bladder, unspecified: Secondary | ICD-10-CM | POA: Diagnosis not present

## 2016-11-30 DIAGNOSIS — Z87891 Personal history of nicotine dependence: Secondary | ICD-10-CM | POA: Diagnosis not present

## 2016-11-30 DIAGNOSIS — Z87440 Personal history of urinary (tract) infections: Secondary | ICD-10-CM | POA: Diagnosis not present

## 2016-11-30 DIAGNOSIS — I10 Essential (primary) hypertension: Secondary | ICD-10-CM | POA: Diagnosis not present

## 2016-11-30 DIAGNOSIS — L219 Seborrheic dermatitis, unspecified: Secondary | ICD-10-CM | POA: Diagnosis not present

## 2016-12-01 DIAGNOSIS — G35 Multiple sclerosis: Secondary | ICD-10-CM | POA: Diagnosis not present

## 2016-12-01 DIAGNOSIS — L219 Seborrheic dermatitis, unspecified: Secondary | ICD-10-CM | POA: Diagnosis not present

## 2016-12-01 DIAGNOSIS — Z87891 Personal history of nicotine dependence: Secondary | ICD-10-CM | POA: Diagnosis not present

## 2016-12-01 DIAGNOSIS — K219 Gastro-esophageal reflux disease without esophagitis: Secondary | ICD-10-CM | POA: Diagnosis not present

## 2016-12-01 DIAGNOSIS — I10 Essential (primary) hypertension: Secondary | ICD-10-CM | POA: Diagnosis not present

## 2016-12-01 DIAGNOSIS — Z87440 Personal history of urinary (tract) infections: Secondary | ICD-10-CM | POA: Diagnosis not present

## 2016-12-01 DIAGNOSIS — Z466 Encounter for fitting and adjustment of urinary device: Secondary | ICD-10-CM | POA: Diagnosis not present

## 2016-12-01 DIAGNOSIS — Z9181 History of falling: Secondary | ICD-10-CM | POA: Diagnosis not present

## 2016-12-01 DIAGNOSIS — N319 Neuromuscular dysfunction of bladder, unspecified: Secondary | ICD-10-CM | POA: Diagnosis not present

## 2016-12-02 ENCOUNTER — Other Ambulatory Visit: Payer: Self-pay | Admitting: Family Medicine

## 2016-12-02 DIAGNOSIS — Z87440 Personal history of urinary (tract) infections: Secondary | ICD-10-CM | POA: Diagnosis not present

## 2016-12-02 DIAGNOSIS — Z9181 History of falling: Secondary | ICD-10-CM | POA: Diagnosis not present

## 2016-12-02 DIAGNOSIS — N319 Neuromuscular dysfunction of bladder, unspecified: Secondary | ICD-10-CM | POA: Diagnosis not present

## 2016-12-02 DIAGNOSIS — Z87891 Personal history of nicotine dependence: Secondary | ICD-10-CM | POA: Diagnosis not present

## 2016-12-02 DIAGNOSIS — L219 Seborrheic dermatitis, unspecified: Secondary | ICD-10-CM | POA: Diagnosis not present

## 2016-12-02 DIAGNOSIS — G35 Multiple sclerosis: Secondary | ICD-10-CM | POA: Diagnosis not present

## 2016-12-02 DIAGNOSIS — I10 Essential (primary) hypertension: Secondary | ICD-10-CM | POA: Diagnosis not present

## 2016-12-02 DIAGNOSIS — Z466 Encounter for fitting and adjustment of urinary device: Secondary | ICD-10-CM | POA: Diagnosis not present

## 2016-12-02 DIAGNOSIS — K219 Gastro-esophageal reflux disease without esophagitis: Secondary | ICD-10-CM | POA: Diagnosis not present

## 2016-12-02 NOTE — Telephone Encounter (Signed)
Changed quantity to 60 per month.. Pt only supposed to be using BID.

## 2016-12-02 NOTE — Telephone Encounter (Signed)
Last office visit 10/28/2016.  Last refilled 10/31/2016 for #90 with no refills.  Ok to refill?

## 2016-12-02 NOTE — Telephone Encounter (Signed)
Diazepam called into [facility] - [place], [place] - [address] 

## 2016-12-03 ENCOUNTER — Other Ambulatory Visit: Payer: Self-pay | Admitting: Family Medicine

## 2016-12-04 NOTE — Telephone Encounter (Signed)
Last office visit 10/28/16.  Last refilled 11/22/2016 for #360 with no refills.  Too soon to refill?

## 2016-12-06 DIAGNOSIS — Z9181 History of falling: Secondary | ICD-10-CM | POA: Diagnosis not present

## 2016-12-06 DIAGNOSIS — Z466 Encounter for fitting and adjustment of urinary device: Secondary | ICD-10-CM | POA: Diagnosis not present

## 2016-12-06 DIAGNOSIS — Z87440 Personal history of urinary (tract) infections: Secondary | ICD-10-CM | POA: Diagnosis not present

## 2016-12-06 DIAGNOSIS — K219 Gastro-esophageal reflux disease without esophagitis: Secondary | ICD-10-CM | POA: Diagnosis not present

## 2016-12-06 DIAGNOSIS — L219 Seborrheic dermatitis, unspecified: Secondary | ICD-10-CM | POA: Diagnosis not present

## 2016-12-06 DIAGNOSIS — Z87891 Personal history of nicotine dependence: Secondary | ICD-10-CM | POA: Diagnosis not present

## 2016-12-06 DIAGNOSIS — G35 Multiple sclerosis: Secondary | ICD-10-CM | POA: Diagnosis not present

## 2016-12-06 DIAGNOSIS — N319 Neuromuscular dysfunction of bladder, unspecified: Secondary | ICD-10-CM | POA: Diagnosis not present

## 2016-12-06 DIAGNOSIS — I10 Essential (primary) hypertension: Secondary | ICD-10-CM | POA: Diagnosis not present

## 2016-12-08 DIAGNOSIS — I10 Essential (primary) hypertension: Secondary | ICD-10-CM | POA: Diagnosis not present

## 2016-12-08 DIAGNOSIS — Z87440 Personal history of urinary (tract) infections: Secondary | ICD-10-CM | POA: Diagnosis not present

## 2016-12-08 DIAGNOSIS — Z9181 History of falling: Secondary | ICD-10-CM | POA: Diagnosis not present

## 2016-12-08 DIAGNOSIS — L219 Seborrheic dermatitis, unspecified: Secondary | ICD-10-CM | POA: Diagnosis not present

## 2016-12-08 DIAGNOSIS — G35 Multiple sclerosis: Secondary | ICD-10-CM | POA: Diagnosis not present

## 2016-12-08 DIAGNOSIS — Z466 Encounter for fitting and adjustment of urinary device: Secondary | ICD-10-CM | POA: Diagnosis not present

## 2016-12-08 DIAGNOSIS — N319 Neuromuscular dysfunction of bladder, unspecified: Secondary | ICD-10-CM | POA: Diagnosis not present

## 2016-12-08 DIAGNOSIS — Z87891 Personal history of nicotine dependence: Secondary | ICD-10-CM | POA: Diagnosis not present

## 2016-12-08 DIAGNOSIS — K219 Gastro-esophageal reflux disease without esophagitis: Secondary | ICD-10-CM | POA: Diagnosis not present

## 2016-12-10 ENCOUNTER — Other Ambulatory Visit: Payer: Self-pay | Admitting: Family Medicine

## 2016-12-13 DIAGNOSIS — L219 Seborrheic dermatitis, unspecified: Secondary | ICD-10-CM | POA: Diagnosis not present

## 2016-12-13 DIAGNOSIS — K219 Gastro-esophageal reflux disease without esophagitis: Secondary | ICD-10-CM | POA: Diagnosis not present

## 2016-12-13 DIAGNOSIS — I10 Essential (primary) hypertension: Secondary | ICD-10-CM | POA: Diagnosis not present

## 2016-12-13 DIAGNOSIS — Z9181 History of falling: Secondary | ICD-10-CM | POA: Diagnosis not present

## 2016-12-13 DIAGNOSIS — Z466 Encounter for fitting and adjustment of urinary device: Secondary | ICD-10-CM | POA: Diagnosis not present

## 2016-12-13 DIAGNOSIS — Z87440 Personal history of urinary (tract) infections: Secondary | ICD-10-CM | POA: Diagnosis not present

## 2016-12-13 DIAGNOSIS — N319 Neuromuscular dysfunction of bladder, unspecified: Secondary | ICD-10-CM | POA: Diagnosis not present

## 2016-12-13 DIAGNOSIS — Z87891 Personal history of nicotine dependence: Secondary | ICD-10-CM | POA: Diagnosis not present

## 2016-12-13 DIAGNOSIS — G35 Multiple sclerosis: Secondary | ICD-10-CM | POA: Diagnosis not present

## 2016-12-16 DIAGNOSIS — Z466 Encounter for fitting and adjustment of urinary device: Secondary | ICD-10-CM | POA: Diagnosis not present

## 2016-12-16 DIAGNOSIS — I10 Essential (primary) hypertension: Secondary | ICD-10-CM | POA: Diagnosis not present

## 2016-12-16 DIAGNOSIS — Z87440 Personal history of urinary (tract) infections: Secondary | ICD-10-CM | POA: Diagnosis not present

## 2016-12-16 DIAGNOSIS — Z9181 History of falling: Secondary | ICD-10-CM | POA: Diagnosis not present

## 2016-12-16 DIAGNOSIS — N319 Neuromuscular dysfunction of bladder, unspecified: Secondary | ICD-10-CM | POA: Diagnosis not present

## 2016-12-16 DIAGNOSIS — G35 Multiple sclerosis: Secondary | ICD-10-CM | POA: Diagnosis not present

## 2016-12-16 DIAGNOSIS — Z87891 Personal history of nicotine dependence: Secondary | ICD-10-CM | POA: Diagnosis not present

## 2016-12-16 DIAGNOSIS — K219 Gastro-esophageal reflux disease without esophagitis: Secondary | ICD-10-CM | POA: Diagnosis not present

## 2016-12-16 DIAGNOSIS — L219 Seborrheic dermatitis, unspecified: Secondary | ICD-10-CM | POA: Diagnosis not present

## 2016-12-17 ENCOUNTER — Other Ambulatory Visit: Payer: Self-pay | Admitting: Family Medicine

## 2016-12-17 DIAGNOSIS — Z87440 Personal history of urinary (tract) infections: Secondary | ICD-10-CM | POA: Diagnosis not present

## 2016-12-17 DIAGNOSIS — G35 Multiple sclerosis: Secondary | ICD-10-CM | POA: Diagnosis not present

## 2016-12-17 DIAGNOSIS — I10 Essential (primary) hypertension: Secondary | ICD-10-CM | POA: Diagnosis not present

## 2016-12-17 DIAGNOSIS — Z9181 History of falling: Secondary | ICD-10-CM | POA: Diagnosis not present

## 2016-12-17 DIAGNOSIS — N319 Neuromuscular dysfunction of bladder, unspecified: Secondary | ICD-10-CM | POA: Diagnosis not present

## 2016-12-17 DIAGNOSIS — L219 Seborrheic dermatitis, unspecified: Secondary | ICD-10-CM | POA: Diagnosis not present

## 2016-12-17 DIAGNOSIS — Z87891 Personal history of nicotine dependence: Secondary | ICD-10-CM | POA: Diagnosis not present

## 2016-12-17 DIAGNOSIS — Z466 Encounter for fitting and adjustment of urinary device: Secondary | ICD-10-CM | POA: Diagnosis not present

## 2016-12-17 DIAGNOSIS — K219 Gastro-esophageal reflux disease without esophagitis: Secondary | ICD-10-CM | POA: Diagnosis not present

## 2016-12-18 NOTE — Telephone Encounter (Signed)
Last office visit 10/28/16.  Last refilled 11/22/16 for #360 with no refills.  Ok to refill?

## 2016-12-19 DIAGNOSIS — Z87891 Personal history of nicotine dependence: Secondary | ICD-10-CM | POA: Diagnosis not present

## 2016-12-19 DIAGNOSIS — N319 Neuromuscular dysfunction of bladder, unspecified: Secondary | ICD-10-CM | POA: Diagnosis not present

## 2016-12-19 DIAGNOSIS — I10 Essential (primary) hypertension: Secondary | ICD-10-CM | POA: Diagnosis not present

## 2016-12-19 DIAGNOSIS — Z466 Encounter for fitting and adjustment of urinary device: Secondary | ICD-10-CM | POA: Diagnosis not present

## 2016-12-19 DIAGNOSIS — L219 Seborrheic dermatitis, unspecified: Secondary | ICD-10-CM | POA: Diagnosis not present

## 2016-12-19 DIAGNOSIS — Z9181 History of falling: Secondary | ICD-10-CM | POA: Diagnosis not present

## 2016-12-19 DIAGNOSIS — G35 Multiple sclerosis: Secondary | ICD-10-CM | POA: Diagnosis not present

## 2016-12-19 DIAGNOSIS — K219 Gastro-esophageal reflux disease without esophagitis: Secondary | ICD-10-CM | POA: Diagnosis not present

## 2016-12-19 DIAGNOSIS — Z87440 Personal history of urinary (tract) infections: Secondary | ICD-10-CM | POA: Diagnosis not present

## 2016-12-20 ENCOUNTER — Telehealth: Payer: Self-pay

## 2016-12-20 NOTE — Telephone Encounter (Signed)
Copied from CRM [id number]. Topic: Inquiry >> Dec 20, 2016  4:42 PM Arlyss Gandy, NT wrote: Reason for CRM: Junious Dresser from [facility] calling needing verbal orders for this pt to extended his occupational therapy for once a week for 3 more weeks. Also, needing an rx for an right elbow conformer splint. Cb#: [phone] Fax#: [phone] Attn Junious Dresser

## 2016-12-20 NOTE — Telephone Encounter (Signed)
Okay to give verbal orders.  Is written  rx needed for splint?

## 2016-12-21 NOTE — Telephone Encounter (Signed)
Verbal order given to Junious DresserConnie at [facility] to extend Mr Baumgartner's occupational therapy for once a week for 3 more weeks.  She does need a written order for Splint faxed to number below.  Advised Dr. Ermalene SearingBedsole is out of office until Friday so they will not get that order until then.

## 2016-12-22 DIAGNOSIS — Z9181 History of falling: Secondary | ICD-10-CM | POA: Diagnosis not present

## 2016-12-22 DIAGNOSIS — I10 Essential (primary) hypertension: Secondary | ICD-10-CM | POA: Diagnosis not present

## 2016-12-22 DIAGNOSIS — Z87891 Personal history of nicotine dependence: Secondary | ICD-10-CM | POA: Diagnosis not present

## 2016-12-22 DIAGNOSIS — Z466 Encounter for fitting and adjustment of urinary device: Secondary | ICD-10-CM | POA: Diagnosis not present

## 2016-12-22 DIAGNOSIS — N319 Neuromuscular dysfunction of bladder, unspecified: Secondary | ICD-10-CM | POA: Diagnosis not present

## 2016-12-22 DIAGNOSIS — L219 Seborrheic dermatitis, unspecified: Secondary | ICD-10-CM | POA: Diagnosis not present

## 2016-12-22 DIAGNOSIS — Z87440 Personal history of urinary (tract) infections: Secondary | ICD-10-CM | POA: Diagnosis not present

## 2016-12-22 DIAGNOSIS — G35 Multiple sclerosis: Secondary | ICD-10-CM | POA: Diagnosis not present

## 2016-12-22 DIAGNOSIS — K219 Gastro-esophageal reflux disease without esophagitis: Secondary | ICD-10-CM | POA: Diagnosis not present

## 2016-12-23 ENCOUNTER — Encounter: Payer: Self-pay | Admitting: Family Medicine

## 2016-12-23 DIAGNOSIS — M62421 Contracture of muscle, right upper arm: Secondary | ICD-10-CM | POA: Insufficient documentation

## 2016-12-23 NOTE — Telephone Encounter (Signed)
In your inbox in my office

## 2016-12-23 NOTE — Telephone Encounter (Signed)
Order for elbow splint faxed to [phone] as requested.

## 2016-12-27 DIAGNOSIS — L89152 Pressure ulcer of sacral region, stage 2: Secondary | ICD-10-CM | POA: Diagnosis not present

## 2016-12-27 DIAGNOSIS — I10 Essential (primary) hypertension: Secondary | ICD-10-CM | POA: Diagnosis not present

## 2016-12-27 DIAGNOSIS — Z87891 Personal history of nicotine dependence: Secondary | ICD-10-CM | POA: Diagnosis not present

## 2016-12-27 DIAGNOSIS — K219 Gastro-esophageal reflux disease without esophagitis: Secondary | ICD-10-CM | POA: Diagnosis not present

## 2016-12-27 DIAGNOSIS — G35 Multiple sclerosis: Secondary | ICD-10-CM | POA: Diagnosis not present

## 2016-12-27 DIAGNOSIS — Z9181 History of falling: Secondary | ICD-10-CM | POA: Diagnosis not present

## 2016-12-27 DIAGNOSIS — Z466 Encounter for fitting and adjustment of urinary device: Secondary | ICD-10-CM | POA: Diagnosis not present

## 2016-12-27 DIAGNOSIS — N319 Neuromuscular dysfunction of bladder, unspecified: Secondary | ICD-10-CM | POA: Diagnosis not present

## 2016-12-27 DIAGNOSIS — Z87440 Personal history of urinary (tract) infections: Secondary | ICD-10-CM | POA: Diagnosis not present

## 2016-12-27 DIAGNOSIS — L219 Seborrheic dermatitis, unspecified: Secondary | ICD-10-CM | POA: Diagnosis not present

## 2016-12-29 DIAGNOSIS — L89152 Pressure ulcer of sacral region, stage 2: Secondary | ICD-10-CM | POA: Diagnosis not present

## 2016-12-30 DIAGNOSIS — Z87440 Personal history of urinary (tract) infections: Secondary | ICD-10-CM | POA: Diagnosis not present

## 2016-12-30 DIAGNOSIS — N319 Neuromuscular dysfunction of bladder, unspecified: Secondary | ICD-10-CM | POA: Diagnosis not present

## 2016-12-30 DIAGNOSIS — Z87891 Personal history of nicotine dependence: Secondary | ICD-10-CM | POA: Diagnosis not present

## 2016-12-30 DIAGNOSIS — K219 Gastro-esophageal reflux disease without esophagitis: Secondary | ICD-10-CM | POA: Diagnosis not present

## 2016-12-30 DIAGNOSIS — Z9181 History of falling: Secondary | ICD-10-CM | POA: Diagnosis not present

## 2016-12-30 DIAGNOSIS — G35 Multiple sclerosis: Secondary | ICD-10-CM | POA: Diagnosis not present

## 2016-12-30 DIAGNOSIS — Z466 Encounter for fitting and adjustment of urinary device: Secondary | ICD-10-CM | POA: Diagnosis not present

## 2016-12-30 DIAGNOSIS — L219 Seborrheic dermatitis, unspecified: Secondary | ICD-10-CM | POA: Diagnosis not present

## 2016-12-30 DIAGNOSIS — I10 Essential (primary) hypertension: Secondary | ICD-10-CM | POA: Diagnosis not present

## 2017-01-04 ENCOUNTER — Other Ambulatory Visit: Payer: Self-pay | Admitting: Family Medicine

## 2017-01-04 NOTE — Telephone Encounter (Signed)
Last office visit 10/28/2016.  Last refilled Ibuprofen-10/31/2016 for #135 with no refills.  Diazepam-12/02/2016 for #60 with no refills.  Ok to refill?

## 2017-01-05 NOTE — Telephone Encounter (Signed)
[facility] [address] [facility] [place], [place] - [id number]

## 2017-01-13 DIAGNOSIS — I10 Essential (primary) hypertension: Secondary | ICD-10-CM | POA: Diagnosis not present

## 2017-01-13 DIAGNOSIS — L219 Seborrheic dermatitis, unspecified: Secondary | ICD-10-CM | POA: Diagnosis not present

## 2017-01-13 DIAGNOSIS — Z9181 History of falling: Secondary | ICD-10-CM | POA: Diagnosis not present

## 2017-01-13 DIAGNOSIS — G35 Multiple sclerosis: Secondary | ICD-10-CM | POA: Diagnosis not present

## 2017-01-13 DIAGNOSIS — M24521 Contracture, right elbow: Secondary | ICD-10-CM | POA: Diagnosis not present

## 2017-01-13 DIAGNOSIS — Z87440 Personal history of urinary (tract) infections: Secondary | ICD-10-CM | POA: Diagnosis not present

## 2017-01-13 DIAGNOSIS — N319 Neuromuscular dysfunction of bladder, unspecified: Secondary | ICD-10-CM | POA: Diagnosis not present

## 2017-01-13 DIAGNOSIS — Z87891 Personal history of nicotine dependence: Secondary | ICD-10-CM | POA: Diagnosis not present

## 2017-01-13 DIAGNOSIS — K219 Gastro-esophageal reflux disease without esophagitis: Secondary | ICD-10-CM | POA: Diagnosis not present

## 2017-01-13 DIAGNOSIS — Z466 Encounter for fitting and adjustment of urinary device: Secondary | ICD-10-CM | POA: Diagnosis not present

## 2017-01-18 DIAGNOSIS — K219 Gastro-esophageal reflux disease without esophagitis: Secondary | ICD-10-CM | POA: Diagnosis not present

## 2017-01-18 DIAGNOSIS — Z9181 History of falling: Secondary | ICD-10-CM | POA: Diagnosis not present

## 2017-01-18 DIAGNOSIS — Z466 Encounter for fitting and adjustment of urinary device: Secondary | ICD-10-CM | POA: Diagnosis not present

## 2017-01-18 DIAGNOSIS — G35 Multiple sclerosis: Secondary | ICD-10-CM | POA: Diagnosis not present

## 2017-01-18 DIAGNOSIS — I10 Essential (primary) hypertension: Secondary | ICD-10-CM | POA: Diagnosis not present

## 2017-01-18 DIAGNOSIS — Z87440 Personal history of urinary (tract) infections: Secondary | ICD-10-CM | POA: Diagnosis not present

## 2017-01-18 DIAGNOSIS — Z87891 Personal history of nicotine dependence: Secondary | ICD-10-CM | POA: Diagnosis not present

## 2017-01-18 DIAGNOSIS — N319 Neuromuscular dysfunction of bladder, unspecified: Secondary | ICD-10-CM | POA: Diagnosis not present

## 2017-01-18 DIAGNOSIS — L219 Seborrheic dermatitis, unspecified: Secondary | ICD-10-CM | POA: Diagnosis not present

## 2017-01-25 DIAGNOSIS — L219 Seborrheic dermatitis, unspecified: Secondary | ICD-10-CM | POA: Diagnosis not present

## 2017-01-25 DIAGNOSIS — Z466 Encounter for fitting and adjustment of urinary device: Secondary | ICD-10-CM | POA: Diagnosis not present

## 2017-01-25 DIAGNOSIS — Z9181 History of falling: Secondary | ICD-10-CM | POA: Diagnosis not present

## 2017-01-25 DIAGNOSIS — K219 Gastro-esophageal reflux disease without esophagitis: Secondary | ICD-10-CM | POA: Diagnosis not present

## 2017-01-25 DIAGNOSIS — Z87891 Personal history of nicotine dependence: Secondary | ICD-10-CM | POA: Diagnosis not present

## 2017-01-25 DIAGNOSIS — I10 Essential (primary) hypertension: Secondary | ICD-10-CM | POA: Diagnosis not present

## 2017-01-25 DIAGNOSIS — G35 Multiple sclerosis: Secondary | ICD-10-CM | POA: Diagnosis not present

## 2017-01-25 DIAGNOSIS — N319 Neuromuscular dysfunction of bladder, unspecified: Secondary | ICD-10-CM | POA: Diagnosis not present

## 2017-01-25 DIAGNOSIS — Z87440 Personal history of urinary (tract) infections: Secondary | ICD-10-CM | POA: Diagnosis not present

## 2017-01-30 ENCOUNTER — Other Ambulatory Visit: Payer: Self-pay | Admitting: Family Medicine

## 2017-01-30 DIAGNOSIS — Z87891 Personal history of nicotine dependence: Secondary | ICD-10-CM | POA: Diagnosis not present

## 2017-01-30 DIAGNOSIS — G35 Multiple sclerosis: Secondary | ICD-10-CM | POA: Diagnosis not present

## 2017-01-30 DIAGNOSIS — Z9181 History of falling: Secondary | ICD-10-CM | POA: Diagnosis not present

## 2017-01-30 DIAGNOSIS — N319 Neuromuscular dysfunction of bladder, unspecified: Secondary | ICD-10-CM | POA: Diagnosis not present

## 2017-01-30 DIAGNOSIS — K219 Gastro-esophageal reflux disease without esophagitis: Secondary | ICD-10-CM | POA: Diagnosis not present

## 2017-01-30 DIAGNOSIS — Z87440 Personal history of urinary (tract) infections: Secondary | ICD-10-CM | POA: Diagnosis not present

## 2017-01-30 DIAGNOSIS — I10 Essential (primary) hypertension: Secondary | ICD-10-CM | POA: Diagnosis not present

## 2017-01-30 DIAGNOSIS — Z466 Encounter for fitting and adjustment of urinary device: Secondary | ICD-10-CM | POA: Diagnosis not present

## 2017-01-30 DIAGNOSIS — L219 Seborrheic dermatitis, unspecified: Secondary | ICD-10-CM | POA: Diagnosis not present

## 2017-01-30 NOTE — Telephone Encounter (Signed)
Last office visit 10/28/2016.  Last refilled: Diazepam 01/05/2017 for #60 with no refills.  Ibuprofen-01/05/2017 for #45 with no refills.  Epitol not on current medication list.   Pharmacy is requesting 90 day supply on all refills.  Refill?

## 2017-01-31 NOTE — Telephone Encounter (Signed)
Diazepam called into [facility] - [place], [place] - [address] 

## 2017-01-31 NOTE — Telephone Encounter (Signed)
Okay to refill all except epitol ..probably from different MD.. ? Neuro.

## 2017-02-06 DIAGNOSIS — K219 Gastro-esophageal reflux disease without esophagitis: Secondary | ICD-10-CM | POA: Diagnosis not present

## 2017-02-06 DIAGNOSIS — L219 Seborrheic dermatitis, unspecified: Secondary | ICD-10-CM | POA: Diagnosis not present

## 2017-02-06 DIAGNOSIS — Z9181 History of falling: Secondary | ICD-10-CM | POA: Diagnosis not present

## 2017-02-06 DIAGNOSIS — N319 Neuromuscular dysfunction of bladder, unspecified: Secondary | ICD-10-CM | POA: Diagnosis not present

## 2017-02-06 DIAGNOSIS — Z87891 Personal history of nicotine dependence: Secondary | ICD-10-CM | POA: Diagnosis not present

## 2017-02-06 DIAGNOSIS — Z466 Encounter for fitting and adjustment of urinary device: Secondary | ICD-10-CM | POA: Diagnosis not present

## 2017-02-06 DIAGNOSIS — G35 Multiple sclerosis: Secondary | ICD-10-CM | POA: Diagnosis not present

## 2017-02-06 DIAGNOSIS — I10 Essential (primary) hypertension: Secondary | ICD-10-CM | POA: Diagnosis not present

## 2017-02-06 DIAGNOSIS — Z87440 Personal history of urinary (tract) infections: Secondary | ICD-10-CM | POA: Diagnosis not present

## 2017-02-13 DIAGNOSIS — K219 Gastro-esophageal reflux disease without esophagitis: Secondary | ICD-10-CM | POA: Diagnosis not present

## 2017-02-13 DIAGNOSIS — L219 Seborrheic dermatitis, unspecified: Secondary | ICD-10-CM | POA: Diagnosis not present

## 2017-02-13 DIAGNOSIS — Z87891 Personal history of nicotine dependence: Secondary | ICD-10-CM | POA: Diagnosis not present

## 2017-02-13 DIAGNOSIS — G35 Multiple sclerosis: Secondary | ICD-10-CM | POA: Diagnosis not present

## 2017-02-13 DIAGNOSIS — Z87440 Personal history of urinary (tract) infections: Secondary | ICD-10-CM | POA: Diagnosis not present

## 2017-02-13 DIAGNOSIS — N319 Neuromuscular dysfunction of bladder, unspecified: Secondary | ICD-10-CM | POA: Diagnosis not present

## 2017-02-13 DIAGNOSIS — I10 Essential (primary) hypertension: Secondary | ICD-10-CM | POA: Diagnosis not present

## 2017-02-13 DIAGNOSIS — Z9181 History of falling: Secondary | ICD-10-CM | POA: Diagnosis not present

## 2017-02-13 DIAGNOSIS — Z466 Encounter for fitting and adjustment of urinary device: Secondary | ICD-10-CM | POA: Diagnosis not present

## 2017-02-14 DIAGNOSIS — N319 Neuromuscular dysfunction of bladder, unspecified: Secondary | ICD-10-CM

## 2017-02-14 DIAGNOSIS — G35 Multiple sclerosis: Secondary | ICD-10-CM

## 2017-02-14 DIAGNOSIS — Z466 Encounter for fitting and adjustment of urinary device: Secondary | ICD-10-CM

## 2017-02-14 DIAGNOSIS — I10 Essential (primary) hypertension: Secondary | ICD-10-CM

## 2017-02-14 DIAGNOSIS — F329 Major depressive disorder, single episode, unspecified: Secondary | ICD-10-CM

## 2017-02-20 DIAGNOSIS — Z9181 History of falling: Secondary | ICD-10-CM | POA: Diagnosis not present

## 2017-02-20 DIAGNOSIS — I10 Essential (primary) hypertension: Secondary | ICD-10-CM | POA: Diagnosis not present

## 2017-02-20 DIAGNOSIS — N319 Neuromuscular dysfunction of bladder, unspecified: Secondary | ICD-10-CM | POA: Diagnosis not present

## 2017-02-20 DIAGNOSIS — Z466 Encounter for fitting and adjustment of urinary device: Secondary | ICD-10-CM | POA: Diagnosis not present

## 2017-02-20 DIAGNOSIS — L219 Seborrheic dermatitis, unspecified: Secondary | ICD-10-CM | POA: Diagnosis not present

## 2017-02-20 DIAGNOSIS — K219 Gastro-esophageal reflux disease without esophagitis: Secondary | ICD-10-CM | POA: Diagnosis not present

## 2017-02-20 DIAGNOSIS — Z87891 Personal history of nicotine dependence: Secondary | ICD-10-CM | POA: Diagnosis not present

## 2017-02-20 DIAGNOSIS — G35 Multiple sclerosis: Secondary | ICD-10-CM | POA: Diagnosis not present

## 2017-02-20 DIAGNOSIS — Z87440 Personal history of urinary (tract) infections: Secondary | ICD-10-CM | POA: Diagnosis not present

## 2017-02-24 ENCOUNTER — Telehealth: Payer: Self-pay

## 2017-02-24 NOTE — Telephone Encounter (Signed)
Copied from CRM #[id number]. Topic: Quick Communication - See Telephone Encounter >> Feb 24, 2017 10:29 AM Elliot Gault wrote: CRM for notification. See Telephone encounter for:   02/24/17.  [name] name: Junious Dresser  Relation to pt: OT [facility] back number: [phone]   Reason for call:  Verbal orders to extend OT for 1 week due to previous missed appointments, please advise >> Feb 24, 2017 10:33 AM Elliot Gault wrote: CRM for notification. See Telephone encounter for:   02/24/17.  [name] name: Junious Dresser  Relation to pt: OT [facility] back number: [phone]   Reason for call:  Verbal orders to extend OT for 1 week due to previous missed appointments, please advise

## 2017-02-24 NOTE — Telephone Encounter (Signed)
Left message for Travis Cantrell with [facility] giving verbal orders  to extend OT for 1 week due to previous missed appointments.

## 2017-03-03 DIAGNOSIS — L219 Seborrheic dermatitis, unspecified: Secondary | ICD-10-CM | POA: Diagnosis not present

## 2017-03-03 DIAGNOSIS — N319 Neuromuscular dysfunction of bladder, unspecified: Secondary | ICD-10-CM | POA: Diagnosis not present

## 2017-03-03 DIAGNOSIS — Z466 Encounter for fitting and adjustment of urinary device: Secondary | ICD-10-CM | POA: Diagnosis not present

## 2017-03-03 DIAGNOSIS — Z9181 History of falling: Secondary | ICD-10-CM | POA: Diagnosis not present

## 2017-03-03 DIAGNOSIS — G35 Multiple sclerosis: Secondary | ICD-10-CM | POA: Diagnosis not present

## 2017-03-03 DIAGNOSIS — K219 Gastro-esophageal reflux disease without esophagitis: Secondary | ICD-10-CM | POA: Diagnosis not present

## 2017-03-03 DIAGNOSIS — I10 Essential (primary) hypertension: Secondary | ICD-10-CM | POA: Diagnosis not present

## 2017-03-03 DIAGNOSIS — Z87891 Personal history of nicotine dependence: Secondary | ICD-10-CM | POA: Diagnosis not present

## 2017-03-03 DIAGNOSIS — Z87440 Personal history of urinary (tract) infections: Secondary | ICD-10-CM | POA: Diagnosis not present

## 2017-03-11 ENCOUNTER — Other Ambulatory Visit: Payer: Self-pay | Admitting: Family Medicine

## 2017-03-13 ENCOUNTER — Encounter (HOSPITAL_COMMUNITY): Payer: Self-pay

## 2017-03-13 ENCOUNTER — Inpatient Hospital Stay (HOSPITAL_COMMUNITY)
Admission: EM | Admit: 2017-03-13 | Discharge: 2017-03-16 | DRG: 700 | Disposition: A | Discharge status: Home Health | Payer: Medicare Other | Attending: Family Medicine | Admitting: Family Medicine

## 2017-03-13 ENCOUNTER — Emergency Department (HOSPITAL_COMMUNITY): Payer: Medicare Other

## 2017-03-13 DIAGNOSIS — A419 Sepsis, unspecified organism: Secondary | ICD-10-CM | POA: Diagnosis not present

## 2017-03-13 DIAGNOSIS — R1033 Periumbilical pain: Secondary | ICD-10-CM | POA: Diagnosis not present

## 2017-03-13 DIAGNOSIS — F329 Major depressive disorder, single episode, unspecified: Secondary | ICD-10-CM | POA: Diagnosis present

## 2017-03-13 DIAGNOSIS — N3289 Other specified disorders of bladder: Secondary | ICD-10-CM | POA: Diagnosis present

## 2017-03-13 DIAGNOSIS — I10 Essential (primary) hypertension: Secondary | ICD-10-CM | POA: Diagnosis not present

## 2017-03-13 DIAGNOSIS — R1030 Lower abdominal pain, unspecified: Secondary | ICD-10-CM | POA: Diagnosis not present

## 2017-03-13 DIAGNOSIS — G35 Multiple sclerosis: Secondary | ICD-10-CM

## 2017-03-13 DIAGNOSIS — Z8249 Family history of ischemic heart disease and other diseases of the circulatory system: Secondary | ICD-10-CM | POA: Diagnosis not present

## 2017-03-13 DIAGNOSIS — K219 Gastro-esophageal reflux disease without esophagitis: Secondary | ICD-10-CM | POA: Diagnosis not present

## 2017-03-13 DIAGNOSIS — Z881 Allergy status to other antibiotic agents status: Secondary | ICD-10-CM

## 2017-03-13 DIAGNOSIS — K5900 Constipation, unspecified: Secondary | ICD-10-CM | POA: Diagnosis not present

## 2017-03-13 DIAGNOSIS — R509 Fever, unspecified: Secondary | ICD-10-CM | POA: Diagnosis not present

## 2017-03-13 DIAGNOSIS — Z79899 Other long term (current) drug therapy: Secondary | ICD-10-CM

## 2017-03-13 DIAGNOSIS — E7800 Pure hypercholesterolemia, unspecified: Secondary | ICD-10-CM

## 2017-03-13 DIAGNOSIS — Z87891 Personal history of nicotine dependence: Secondary | ICD-10-CM

## 2017-03-13 DIAGNOSIS — Z833 Family history of diabetes mellitus: Secondary | ICD-10-CM | POA: Diagnosis not present

## 2017-03-13 DIAGNOSIS — N390 Urinary tract infection, site not specified: Secondary | ICD-10-CM | POA: Diagnosis present

## 2017-03-13 DIAGNOSIS — Z807 Family history of other malignant neoplasms of lymphoid, hematopoietic and related tissues: Secondary | ICD-10-CM

## 2017-03-13 DIAGNOSIS — E785 Hyperlipidemia, unspecified: Secondary | ICD-10-CM | POA: Diagnosis not present

## 2017-03-13 DIAGNOSIS — N3000 Acute cystitis without hematuria: Secondary | ICD-10-CM | POA: Diagnosis not present

## 2017-03-13 DIAGNOSIS — T83511A Infection and inflammatory reaction due to indwelling urethral catheter, initial encounter: Secondary | ICD-10-CM | POA: Diagnosis not present

## 2017-03-13 DIAGNOSIS — Y846 Urinary catheterization as the cause of abnormal reaction of the patient, or of later complication, without mention of misadventure at the time of the procedure: Secondary | ICD-10-CM | POA: Diagnosis present

## 2017-03-13 LAB — CBC WITH DIFFERENTIAL/PLATELET
BASOS PCT: 0 %
Basophils Absolute: 0 10*3/uL (ref 0.0–0.1)
EOS ABS: 0 10*3/uL (ref 0.0–0.7)
Eosinophils Relative: 0 %
HCT: 50 % (ref 39.0–52.0)
Hemoglobin: 16.6 g/dL (ref 13.0–17.0)
Lymphocytes Relative: 8 %
Lymphs Abs: 1.9 10*3/uL (ref 0.7–4.0)
MCH: 27.9 pg (ref 26.0–34.0)
MCHC: 33.2 g/dL (ref 30.0–36.0)
MCV: 84 fL (ref 78.0–100.0)
MONO ABS: 1.8 10*3/uL — AB (ref 0.1–1.0)
MONOS PCT: 7 %
Neutro Abs: 21.2 10*3/uL — ABNORMAL HIGH (ref 1.7–7.7)
Neutrophils Relative %: 85 %
Platelets: 390 10*3/uL (ref 150–400)
RBC: 5.95 MIL/uL — ABNORMAL HIGH (ref 4.22–5.81)
RDW: 14.7 % (ref 11.5–15.5)
WBC: 25 10*3/uL — ABNORMAL HIGH (ref 4.0–10.5)

## 2017-03-13 LAB — COMPREHENSIVE METABOLIC PANEL
ALK PHOS: 81 U/L (ref 38–126)
ALT: 10 U/L — ABNORMAL LOW (ref 17–63)
ANION GAP: 11 (ref 5–15)
AST: 16 U/L (ref 15–41)
Albumin: 3.6 g/dL (ref 3.5–5.0)
BILIRUBIN TOTAL: 1 mg/dL (ref 0.3–1.2)
BUN: 23 mg/dL — ABNORMAL HIGH (ref 6–20)
CALCIUM: 9.5 mg/dL (ref 8.9–10.3)
CO2: 22 mmol/L (ref 22–32)
Chloride: 109 mmol/L (ref 101–111)
Creatinine, Ser: 1.22 mg/dL (ref 0.61–1.24)
GFR calc Af Amer: >60 mL/min (ref >60)
GLUCOSE: 145 mg/dL — AB (ref 65–99)
Potassium: 4.2 mmol/L (ref 3.5–5.1)
Sodium: 142 mmol/L (ref 135–145)
TOTAL PROTEIN: 7.4 g/dL (ref 6.5–8.1)

## 2017-03-13 LAB — URINALYSIS, ROUTINE W REFLEX MICROSCOPIC
BILIRUBIN URINE: NEGATIVE
GLUCOSE, UA: NEGATIVE mg/dL
Nitrite: POSITIVE — AB
Protein, ur: 100 mg/dL — AB
SPECIFIC GRAVITY, URINE: 1.02 (ref 1.005–1.030)
pH: 7 (ref 5.0–8.0)

## 2017-03-13 LAB — CREATININE, SERUM
CREATININE: 1.08 mg/dL (ref 0.61–1.24)
GFR calc Af Amer: >60 mL/min (ref >60)
GFR calc non Af Amer: >60 mL/min (ref >60)

## 2017-03-13 LAB — URINALYSIS, MICROSCOPIC (REFLEX)

## 2017-03-13 LAB — CBC
HCT: 46.6 % (ref 39.0–52.0)
HEMOGLOBIN: 15.1 g/dL (ref 13.0–17.0)
MCH: 27.3 pg (ref 26.0–34.0)
MCHC: 32.4 g/dL (ref 30.0–36.0)
MCV: 84.1 fL (ref 78.0–100.0)
Platelets: 337 10*3/uL (ref 150–400)
RBC: 5.54 MIL/uL (ref 4.22–5.81)
RDW: 14.5 % (ref 11.5–15.5)
WBC: 16.4 10*3/uL — ABNORMAL HIGH (ref 4.0–10.5)

## 2017-03-13 LAB — PROTIME-INR
INR: 1.07
PROTHROMBIN TIME: 13.8 s (ref 11.4–15.2)

## 2017-03-13 LAB — I-STAT CG4 LACTIC ACID, ED: Lactic Acid, Venous: 1.21 mmol/L (ref 0.5–1.9)

## 2017-03-13 MED ORDER — ONDANSETRON HCL 4 MG PO TABS: 4.0000 mg | ORAL_TABLET | Freq: Four times a day (QID) | ORAL | Status: DC | PRN

## 2017-03-13 MED ORDER — CARBAMAZEPINE 200 MG PO TABS
200.0000 mg | ORAL_TABLET | Freq: Two times a day (BID) | ORAL | Status: DC
Start: 2017-03-13 — End: 2017-03-13

## 2017-03-13 MED ORDER — FLUOXETINE HCL 20 MG PO CAPS
20.0000 mg | ORAL_CAPSULE | Freq: Two times a day (BID) | ORAL | Status: DC
  Administered 2017-03-13 – 2017-03-16 (×7): 20 mg via ORAL
  Filled 2017-03-13 (×7): qty 1

## 2017-03-13 MED ORDER — SODIUM CHLORIDE 0.9 % IV BOLUS (SEPSIS)
1000.0000 mL | Freq: Once | INTRAVENOUS | Status: AC
  Administered 2017-03-13: 1000 mL via INTRAVENOUS

## 2017-03-13 MED ORDER — ACETAMINOPHEN 650 MG RE SUPP: 650.0000 mg | Freq: Four times a day (QID) | RECTAL | Status: DC | PRN

## 2017-03-13 MED ORDER — VITAMIN D3 25 MCG (1000 UNIT) PO TABS
4000.0000 [IU] | ORAL_TABLET | Freq: Every day | ORAL | Status: DC
  Administered 2017-03-13 – 2017-03-16 (×4): 4000 [IU] via ORAL
  Filled 2017-03-13 (×6): qty 4

## 2017-03-13 MED ORDER — BISACODYL 5 MG PO TBEC: 5.0000 mg | DELAYED_RELEASE_TABLET | Freq: Every day | ORAL | Status: DC | PRN

## 2017-03-13 MED ORDER — PHENOL 1.4 % MT LIQD: 1.0000 | OROMUCOSAL | Status: DC | PRN

## 2017-03-13 MED ORDER — GUAIFENESIN-DM 100-10 MG/5ML PO SYRP: 5.0000 mL | ORAL_SOLUTION | ORAL | Status: DC | PRN

## 2017-03-13 MED ORDER — ACETAMINOPHEN 325 MG PO TABS: 650.0000 mg | ORAL_TABLET | Freq: Four times a day (QID) | ORAL | Status: DC | PRN

## 2017-03-13 MED ORDER — HYDROCORTISONE 1 % EX CREA: 1.0000 "application " | TOPICAL_CREAM | Freq: Three times a day (TID) | CUTANEOUS | Status: DC | PRN

## 2017-03-13 MED ORDER — ATORVASTATIN CALCIUM 40 MG PO TABS
40.0000 mg | ORAL_TABLET | Freq: Every day | ORAL | Status: DC
  Administered 2017-03-13 – 2017-03-16 (×4): 40 mg via ORAL
  Filled 2017-03-13 (×4): qty 1

## 2017-03-13 MED ORDER — BUTALBITAL-APAP-CAFFEINE 50-325-40 MG PO TABS
1.0000 | ORAL_TABLET | Freq: Four times a day (QID) | ORAL | Status: DC | PRN
Start: 2017-03-13 — End: 2017-03-16

## 2017-03-13 MED ORDER — OXYBUTYNIN CHLORIDE 5 MG PO TABS: 5.0000 mg | ORAL_TABLET | Freq: Three times a day (TID) | ORAL | Status: DC | PRN

## 2017-03-13 MED ORDER — HYDROCORTISONE 2.5 % RE CREA: 1.0000 "application " | TOPICAL_CREAM | Freq: Four times a day (QID) | RECTAL | Status: DC | PRN

## 2017-03-13 MED ORDER — LIP MEDEX EX OINT: 1.0000 "application " | TOPICAL_OINTMENT | CUTANEOUS | Status: DC | PRN

## 2017-03-13 MED ORDER — ENOXAPARIN SODIUM 40 MG/0.4ML ~~LOC~~ SOLN
40.0000 mg | SUBCUTANEOUS | Status: DC
  Administered 2017-03-14 – 2017-03-15 (×2): 40 mg via SUBCUTANEOUS
  Filled 2017-03-13 (×4): qty 0.4

## 2017-03-13 MED ORDER — LORATADINE 10 MG PO TABS: 10.0000 mg | ORAL_TABLET | Freq: Every day | ORAL | Status: DC | PRN

## 2017-03-13 MED ORDER — SODIUM CHLORIDE 0.9 % IV SOLN
2.0000 g | Freq: Two times a day (BID) | INTRAVENOUS | Status: DC
  Administered 2017-03-13 – 2017-03-16 (×6): 2 g via INTRAVENOUS
  Filled 2017-03-13 (×7): qty 2

## 2017-03-13 MED ORDER — GABAPENTIN 300 MG PO CAPS
900.0000 mg | ORAL_CAPSULE | Freq: Three times a day (TID) | ORAL | Status: DC
  Administered 2017-03-13 – 2017-03-16 (×10): 900 mg via ORAL
  Filled 2017-03-13 (×10): qty 3

## 2017-03-13 MED ORDER — ONDANSETRON HCL 4 MG/2ML IJ SOLN: 4.0000 mg | Freq: Four times a day (QID) | INTRAMUSCULAR | Status: DC | PRN

## 2017-03-13 MED ORDER — DIAZEPAM 5 MG PO TABS: 5.0000 mg | ORAL_TABLET | Freq: Two times a day (BID) | ORAL | Status: DC | PRN

## 2017-03-13 MED ORDER — DIPHENHYDRAMINE HCL 25 MG PO CAPS: 25.0000 mg | ORAL_CAPSULE | Freq: Four times a day (QID) | ORAL | Status: DC | PRN

## 2017-03-13 MED ORDER — SENNOSIDES-DOCUSATE SODIUM 8.6-50 MG PO TABS: 1.0000 | ORAL_TABLET | Freq: Every evening | ORAL | Status: DC | PRN

## 2017-03-13 MED ORDER — ALUM & MAG HYDROXIDE-SIMETH 200-200-20 MG/5ML PO SUSP: 30.0000 mL | ORAL | Status: DC | PRN

## 2017-03-13 MED ORDER — PANTOPRAZOLE SODIUM 40 MG PO TBEC
40.0000 mg | DELAYED_RELEASE_TABLET | Freq: Every day | ORAL | Status: DC
Start: 2017-03-13 — End: 2017-03-16
  Administered 2017-03-13 – 2017-03-16 (×4): 40 mg via ORAL
  Filled 2017-03-13 (×4): qty 1

## 2017-03-13 MED ORDER — SODIUM CHLORIDE 0.9 % IV SOLN
2.0000 g | Freq: Once | INTRAVENOUS | Status: AC
  Administered 2017-03-13: 2 g via INTRAVENOUS
  Filled 2017-03-13: qty 2

## 2017-03-13 MED ORDER — SALINE SPRAY 0.65 % NA SOLN: 1.0000 | NASAL | Status: DC | PRN

## 2017-03-13 MED ORDER — METOCLOPRAMIDE HCL 10 MG PO TABS: 10.0000 mg | ORAL_TABLET | Freq: Four times a day (QID) | ORAL | Status: DC | PRN

## 2017-03-13 MED ORDER — ENSURE ENLIVE PO LIQD
237.0000 mL | Freq: Two times a day (BID) | ORAL | Status: DC
  Administered 2017-03-14 – 2017-03-16 (×6): 237 mL via ORAL
  Filled 2017-03-13: qty 237

## 2017-03-13 MED ORDER — LISINOPRIL 20 MG PO TABS
20.0000 mg | ORAL_TABLET | Freq: Every day | ORAL | Status: DC
  Administered 2017-03-13 – 2017-03-16 (×3): 20 mg via ORAL
  Filled 2017-03-13 (×4): qty 1

## 2017-03-13 MED ORDER — HYDROCODONE-ACETAMINOPHEN 5-325 MG PO TABS: 1.0000 | ORAL_TABLET | Freq: Four times a day (QID) | ORAL | Status: DC | PRN

## 2017-03-13 MED ORDER — SODIUM CHLORIDE 0.9 % IV SOLN
INTRAVENOUS | Status: DC
  Administered 2017-03-13 – 2017-03-15 (×3): via INTRAVENOUS

## 2017-03-13 MED ORDER — MUSCLE RUB 10-15 % EX CREA: 1.0000 "application " | TOPICAL_CREAM | CUTANEOUS | Status: DC | PRN

## 2017-03-13 MED ORDER — POLYVINYL ALCOHOL 1.4 % OP SOLN: 1.0000 [drp] | OPHTHALMIC | Status: DC | PRN

## 2017-03-13 MED ORDER — ALBUTEROL SULFATE (2.5 MG/3ML) 0.083% IN NEBU: 2.5000 mg | INHALATION_SOLUTION | RESPIRATORY_TRACT | Status: DC | PRN

## 2017-03-13 NOTE — ED Notes (Signed)
Dr.Glick notified of pt temp and verbal order for Sepsis protocol to be initiated along with catheter change.

## 2017-03-13 NOTE — ED Notes (Signed)
Hospitalist speaking with patient at bedside.

## 2017-03-13 NOTE — Telephone Encounter (Signed)
Last office visit 10/28/2016.  Last refilled Ibuprofen: 01/31/2017 for #90 with no refills.  Diazepam: 01/31/2017 for #60 with no refills.  Gabapentin: 12/19/2016 for #360 with 1 refill.  Ok to refill?

## 2017-03-13 NOTE — Progress Notes (Signed)
Pharmacy Antibiotic Note  ADREYAN SEYLLER is a 57 y.o. male admitted on 03/13/2017 with UTI.  Pharmacy has been consulted for cefepime dosing.  Plan: Cefepime 2gm IV q12h Follow renal function and de-escalation  Height: 5\' 10"  (177.8 cm) Weight: 215 lb (97.5 kg) IBW/kg (Calculated) : 73  Temp (24hrs), Avg:99.2 F (37.3 C), Min:98.4 F (36.9 C), Max:100 F (37.8 C)  Recent Labs  Lab 03/13/17 0413 03/13/17 0429  WBC 25.0*  --   CREATININE 1.22  --   LATICACIDVEN  --  1.21    Estimated Creatinine Clearance: 79.2 mL/min (by C-G formula based on SCr of 1.22 mg/dL).    Allergies  Allergen Reactions  . Baclofen Diarrhea     Thank you for allowing pharmacy to be a part of this patient's care.  Arley Phenix RPh 03/13/2017, 8:24 AM Pager [phone]

## 2017-03-13 NOTE — ED Notes (Signed)
ED TO INPATIENT HANDOFF REPORT  Name/Age/Gender Travis Cantrell 57 y.o. male  Code Status    Code Status Orders  (From admission, onward)        Start     Ordered   03/13/17 0812  Full code  Continuous     03/13/17 0813    Code Status History    Date Active Date Inactive Code Status Order ID Comments User Context   07/06/2015 00:18 07/09/2015 15:21 Full Code [id number]  Saundra Shelling, MD ED   03/16/2015 19:45 03/19/2015 19:41 Full Code [id number]  Harrie Foreman, MD Inpatient   02/15/2015 21:30 02/18/2015 18:34 Full Code [id number]  Silver Huguenin, RN Inpatient   01/29/2015 09:46 02/02/2015 18:20 Full Code [id number]  Saundra Shelling, MD Inpatient   11/03/2014 08:04 11/05/2014 14:36 Full Code [id number]  Harrie Foreman, MD Inpatient   08/01/2014 15:18 08/04/2014 18:26 Full Code [id number]  Epifanio Lesches, MD ED   06/15/2014 01:36 06/18/2014 20:33 Full Code [id number]  Harrie Foreman, MD ED      Home/SNF/Other Home  Chief Complaint Lower Abdominal Pain, cloudy urine, constipation  Level of Care/Admitting Diagnosis ED Disposition    ED Disposition Condition [facility] Area: [facility] [[id number]]  Level of Care: Med-Surg [16]  Diagnosis: UTI (urinary tract infection) [631497]  Ad[facility] Physician: [facility] [[id number]]  Attending Physician: Damita Lack [[id number]]  PT Class (Do Not Modify): Observation [104]  PT Acc Code (Do Not Modify): Observation [10022]       Medical History Past Medical History:  Diagnosis Date  . Allergy   . Decubitus ulcer   . GERD (gastroesophageal reflux disease)   . Hypertension   . MS (multiple sclerosis) (HCC)     Allergies Allergies  Allergen Reactions  . Baclofen Diarrhea    IV Location/Drains/Wounds Patient Lines/Drains/Airways Status   Active Line/Drains/Airways    Name:   Placement date:   Placement time:   Site:   Days:   Peripheral IV 03/13/17 Left Forearm   03/13/17    0418     Forearm   less than 1   Urethral Catheter Ilene C. RN Straight-tip 22 Fr.   03/13/17    0454    Straight-tip   less than 1   Urethral Catheter Ilene, RN Straight-tip 22 Fr.   03/13/17    0520    Straight-tip   less than 1          Labs/Imaging Results for orders placed or performed during the hospital encounter of 03/13/17 (from the past 48 hour(s))  Comprehensive metabolic panel     Status: Abnormal   Collection Time: 03/13/17  4:13 AM  Result Value Ref Range   Sodium 142 135 - 145 mmol/L   Potassium 4.2 3.5 - 5.1 mmol/L   Chloride 109 101 - 111 mmol/L   CO2 22 22 - 32 mmol/L   Glucose, Bld 145 (H) 65 - 99 mg/dL   BUN 23 (H) 6 - 20 mg/dL   Creatinine, Ser 1.22 0.61 - 1.24 mg/dL   Calcium 9.5 8.9 - 10.3 mg/dL   Total Protein 7.4 6.5 - 8.1 g/dL   Albumin 3.6 3.5 - 5.0 g/dL   AST 16 15 - 41 U/L   ALT 10 (L) 17 - 63 U/L   Alkaline Phosphatase 81 38 - 126 U/L   Total Bilirubin 1.0 0.3 - 1.2 mg/dL   GFR calc non Af Amer >60 >60 mL/min  GFR calc Af Amer >60 >60 mL/min    Comment: (NOTE) The eGFR has been calculated using the CKD EPI equation. This calculation has not been validated in all clinical situations. eGFR's persistently <60 mL/min signify possible Chronic Kidney Disease.    Anion gap 11 5 - 15    Comment: Performed at [facility], [place] [address], [place], [place] [address]  CBC with Differential     Status: Abnormal   Collection Time: 03/13/17  4:13 AM  Result Value Ref Range   WBC 25.0 (H) 4.0 - 10.5 K/uL   RBC 5.95 (H) 4.22 - 5.81 MIL/uL   Hemoglobin 16.6 13.0 - 17.0 g/dL   HCT 50.0 39.0 - 52.0 %   MCV 84.0 78.0 - 100.0 fL   MCH 27.9 26.0 - 34.0 pg   MCHC 33.2 30.0 - 36.0 g/dL   RDW 14.7 11.5 - 15.5 %   Platelets 390 150 - 400 K/uL   Neutrophils Relative % 85 %   Neutro Abs 21.2 (H) 1.7 - 7.7 K/uL   Lymphocytes Relative 8 %   Lymphs Abs 1.9 0.7 - 4.0 K/uL   Monocytes Relative 7 %   Monocytes Absolute 1.8 (H) 0.1 - 1.0 K/uL    Eosinophils Relative 0 %   Eosinophils Absolute 0.0 0.0 - 0.7 K/uL   Basophils Relative 0 %   Basophils Absolute 0.0 0.0 - 0.1 K/uL    Comment: Performed at [facility], [place] [address], [place], [place] [address]  Protime-INR     Status: None   Collection Time: 03/13/17  4:13 AM  Result Value Ref Range   Prothrombin Time 13.8 11.4 - 15.2 seconds   INR 1.07     Comment: Performed at [facility], [place] [address] [place], [place] [address]  I-Stat CG4 Lactic Acid, ED     Status: None   Collection Time: 03/13/17  4:29 AM  Result Value Ref Range   Lactic Acid, Venous 1.21 0.5 - 1.9 mmol/L  Urinalysis, Routine w reflex microscopic- may I&O cath if menses     Status: Abnormal   Collection Time: 03/13/17  5:35 AM  Result Value Ref Range   Color, Urine YELLOW YELLOW   APPearance CLOUDY (A) CLEAR   Specific Gravity, Urine 1.020 1.005 - 1.030   pH 7.0 5.0 - 8.0   Glucose, UA NEGATIVE NEGATIVE mg/dL   Hgb urine dipstick LARGE (A) NEGATIVE   Bilirubin Urine NEGATIVE NEGATIVE   Ketones, ur TRACE (A) NEGATIVE mg/dL   Protein, ur 100 (A) NEGATIVE mg/dL   Nitrite POSITIVE (A) NEGATIVE   Leukocytes, UA MODERATE (A) NEGATIVE    Comment: Performed at [place] [address] [place], [place] [address]  Urinalysis, Microscopic (reflex)     Status: Abnormal   Collection Time: 03/13/17  5:35 AM  Result Value Ref Range   RBC / HPF 0-5 0 - 5 RBC/hpf   WBC, UA 6-30 0 - 5 WBC/hpf   Bacteria, UA FEW (A) NONE SEEN   Squamous Epithelial / LPF 0-5 (A) NONE SEEN    Comment: Performed at [facility], [place] [address], [place], [place] [address]  CBC     Status: Abnormal   Collection Time: 03/13/17 11:39 AM  Result Value Ref Range   WBC 16.4 (H) 4.0 - 10.5 K/uL   RBC 5.54 4.22 - 5.81 MIL/uL   Hemoglobin 15.1 13.0 - 17.0 g/dL   HCT 46.6 39.0 -  52.0 %   MCV 84.1 78.0 - 100.0 fL   MCH 27.3 26.0 - 34.0 pg   MCHC 32.4 30.0 -  36.0 g/dL   RDW 14.5 11.5 - 15.5 %   Platelets 337 150 - 400 K/uL    Comment: Performed at [facility], [place] [address], [place], [place] [address]  Creatinine, serum     Status: None   Collection Time: 03/13/17 11:39 AM  Result Value Ref Range   Creatinine, Ser 1.08 0.61 - 1.24 mg/dL   GFR calc non Af Amer >60 >60 mL/min   GFR calc Af Amer >60 >60 mL/min    Comment: (NOTE) The eGFR has been calculated using the CKD EPI equation. This calculation has not been validated in all clinical situations. eGFR's persistently <60 mL/min signify possible Chronic Kidney Disease. Performed at [facility], [place] [address], [place], [place] [address]    Dg Chest 2 View  Result Date: 03/13/2017 CLINICAL DATA:  Fever, sepsis protocol. EXAM: CHEST  2 VIEW COMPARISON:  Radiograph 07/05/2015 FINDINGS: The cardiomediastinal contours are normal. The lungs are clear. Pulmonary vasculature is normal. No consolidation, pleural effusion, or pneumothorax. No acute osseous abnormalities are seen. Patient's and projects over the lower most right lung base on the second AP image. IMPRESSION: No acute pulmonary process. Electronically Signed   By: Jeb Levering M.D.   On: 03/13/2017 04:18    Pending Labs Unresulted Labs (From admission, onward)   Start     Ordered   03/20/17 0500  Creatinine, serum  (enoxaparin (LOVENOX)    CrCl >/= 30 ml/min)  Weekly,   R    Comments:  while on enoxaparin therapy    03/13/17 0813   03/14/17 0500  Comprehensive metabolic panel  Tomorrow morning,   R     03/13/17 0813   03/14/17 0500  CBC  Tomorrow morning,   R     03/13/17 0813   03/13/17 0812  HIV antibody (Routine Testing)  Once,   R     03/13/17 0813   03/13/17 0641  Urine culture  STAT,   STAT     03/13/17 0640   03/13/17 0346  Culture, blood (Routine x 2)  BLOOD CULTURE X 2,   STAT     03/13/17 0348      Vitals/Pain Today's Vitals   03/13/17 1350 03/13/17 1400 03/13/17  1441 03/13/17 1616  BP: 113/81 103/76 102/83 106/78  Pulse:  91 95 93  Resp:  '20 19 17  ' Temp:      TempSrc:      SpO2:  98% 97% 97%  Weight:      Height:        Isolation Precautions No active isolations  Medications Medications  feeding supplement (ENSURE ENLIVE) (ENSURE ENLIVE) liquid 237 mL (not administered)  albuterol (PROVENTIL) (2.5 MG/3ML) 0.083% nebulizer solution 2.5 mg (not administered)  atorvastatin (LIPITOR) tablet 40 mg (40 mg Oral Given 03/13/17 1350)  butalbital-acetaminophen-caffeine (FIORICET, ESGIC) 50-325-40 MG per tablet 1 tablet (not administered)  HYDROcodone-acetaminophen (NORCO/VICODIN) 5-325 MG per tablet 1 tablet (not administered)  metoCLOPramide (REGLAN) tablet 10 mg (not administered)  diphenhydrAMINE (BENADRYL) capsule 25-50 mg (not administered)  cholecalciferol (VITAMIN D) tablet 4,000 Units (4,000 Units Oral Given 03/13/17 1402)  pantoprazole (PROTONIX) EC tablet 40 mg (40 mg Oral Given 03/13/17 1349)  lisinopril (PRINIVIL,ZESTRIL) tablet 20 mg (20 mg Oral Given 03/13/17 1350)  gabapentin (NEURONTIN) capsule 900 mg (900 mg Oral Not Given 03/13/17 1536)  FLUoxetine (PROZAC) capsule 20 mg (20 mg Oral Given 03/13/17 1349)  enoxaparin (LOVENOX) injection 40 mg (not administered)  0.9 %  sodium chloride infusion ( Intravenous New Bag/Given 03/13/17 1340)  acetaminophen (TYLENOL) tablet 650 mg (not administered)    Or  acetaminophen (TYLENOL) suppository 650 mg (not administered)  senna-docusate (Senokot-S) tablet 1 tablet (not administered)  bisacodyl (DULCOLAX) EC tablet 5 mg (not administered)  ondansetron (ZOFRAN) tablet 4 mg (not administered)    Or  ondansetron (ZOFRAN) injection 4 mg (not administered)  loratadine (CLARITIN) tablet 10 mg (not administered)  lip balm (CARMEX) ointment 1 application (not administered)  polyvinyl alcohol (LIQUIFILM TEARS) 1.4 % ophthalmic solution 1 drop (not administered)  hydrocortisone (ANUSOL-HC) 2.5 % rectal  cream 1 application (not administered)  alum & mag hydroxide-simeth (MAALOX/MYLANTA) 200-200-20 MG/5ML suspension 30 mL (not administered)  hydrocortisone cream 1 % 1 application (not administered)  MUSCLE RUB CREA 1 application (not administered)  sodium chloride (OCEAN) 0.65 % nasal spray 1 spray (not administered)  phenol (CHLORASEPTIC) mouth spray 1 spray (not administered)  guaiFENesin-dextromethorphan (ROBITUSSIN DM) 100-10 MG/5ML syrup 5 mL (not administered)  ceFEPIme (MAXIPIME) 2 g in sodium chloride 0.9 % 100 mL IVPB (2 g Intravenous Not Given 03/13/17 1142)  diazepam (VALIUM) tablet 5 mg (not administered)  sodium chloride 0.9 % bolus 1,000 mL (0 mLs Intravenous Stopped 03/13/17 0654)  ceFEPIme (MAXIPIME) 2 g in sodium chloride 0.9 % 100 mL IVPB (0 g Intravenous Stopped 03/13/17 0728)    Mobility walks with person assist
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
## 2017-03-13 NOTE — ED Triage Notes (Signed)
Patient BIB EMS for lower abdominal pain that started this evening after eating BBQ. Patient also states he has not had a BM in 2 days and has noticed cloudiness in his urine.

## 2017-03-14 DIAGNOSIS — Y846 Urinary catheterization as the cause of abnormal reaction of the patient, or of later complication, without mention of misadventure at the time of the procedure: Secondary | ICD-10-CM | POA: Diagnosis present

## 2017-03-14 DIAGNOSIS — N390 Urinary tract infection, site not specified: Secondary | ICD-10-CM | POA: Diagnosis not present

## 2017-03-14 DIAGNOSIS — Z87891 Personal history of nicotine dependence: Secondary | ICD-10-CM | POA: Diagnosis not present

## 2017-03-14 DIAGNOSIS — N3000 Acute cystitis without hematuria: Secondary | ICD-10-CM | POA: Diagnosis not present

## 2017-03-14 DIAGNOSIS — E785 Hyperlipidemia, unspecified: Secondary | ICD-10-CM | POA: Diagnosis present

## 2017-03-14 DIAGNOSIS — Z807 Family history of other malignant neoplasms of lymphoid, hematopoietic and related tissues: Secondary | ICD-10-CM | POA: Diagnosis not present

## 2017-03-14 DIAGNOSIS — Z79899 Other long term (current) drug therapy: Secondary | ICD-10-CM | POA: Diagnosis not present

## 2017-03-14 DIAGNOSIS — N3289 Other specified disorders of bladder: Secondary | ICD-10-CM | POA: Diagnosis present

## 2017-03-14 DIAGNOSIS — B999 Unspecified infectious disease: Secondary | ICD-10-CM | POA: Diagnosis not present

## 2017-03-14 DIAGNOSIS — K219 Gastro-esophageal reflux disease without esophagitis: Secondary | ICD-10-CM | POA: Diagnosis present

## 2017-03-14 DIAGNOSIS — Z833 Family history of diabetes mellitus: Secondary | ICD-10-CM | POA: Diagnosis not present

## 2017-03-14 DIAGNOSIS — F329 Major depressive disorder, single episode, unspecified: Secondary | ICD-10-CM | POA: Diagnosis present

## 2017-03-14 DIAGNOSIS — I10 Essential (primary) hypertension: Secondary | ICD-10-CM | POA: Diagnosis present

## 2017-03-14 DIAGNOSIS — Z8249 Family history of ischemic heart disease and other diseases of the circulatory system: Secondary | ICD-10-CM | POA: Diagnosis not present

## 2017-03-14 DIAGNOSIS — Z881 Allergy status to other antibiotic agents status: Secondary | ICD-10-CM | POA: Diagnosis not present

## 2017-03-14 DIAGNOSIS — K5900 Constipation, unspecified: Secondary | ICD-10-CM | POA: Diagnosis present

## 2017-03-14 DIAGNOSIS — G35 Multiple sclerosis: Secondary | ICD-10-CM | POA: Diagnosis present

## 2017-03-14 DIAGNOSIS — T83511A Infection and inflammatory reaction due to indwelling urethral catheter, initial encounter: Secondary | ICD-10-CM | POA: Diagnosis present

## 2017-03-14 LAB — URINE CULTURE

## 2017-03-14 LAB — COMPREHENSIVE METABOLIC PANEL
ALT: 8 U/L — AB (ref 17–63)
AST: 9 U/L — ABNORMAL LOW (ref 15–41)
Albumin: 3 g/dL — ABNORMAL LOW (ref 3.5–5.0)
Alkaline Phosphatase: 57 U/L (ref 38–126)
Anion gap: 9 (ref 5–15)
BILIRUBIN TOTAL: 0.8 mg/dL (ref 0.3–1.2)
BUN: 24 mg/dL — ABNORMAL HIGH (ref 6–20)
CHLORIDE: 110 mmol/L (ref 101–111)
CO2: 20 mmol/L — ABNORMAL LOW (ref 22–32)
CREATININE: 1.06 mg/dL (ref 0.61–1.24)
Calcium: 8.5 mg/dL — ABNORMAL LOW (ref 8.9–10.3)
Glucose, Bld: 119 mg/dL — ABNORMAL HIGH (ref 65–99)
Potassium: 4.1 mmol/L (ref 3.5–5.1)
Sodium: 139 mmol/L (ref 135–145)
TOTAL PROTEIN: 6 g/dL — AB (ref 6.5–8.1)

## 2017-03-14 LAB — CBC
HCT: 43.7 % (ref 39.0–52.0)
Hemoglobin: 13.6 g/dL (ref 13.0–17.0)
MCH: 27 pg (ref 26.0–34.0)
MCHC: 31.1 g/dL (ref 30.0–36.0)
MCV: 86.7 fL (ref 78.0–100.0)
PLATELETS: 347 10*3/uL (ref 150–400)
RBC: 5.04 MIL/uL (ref 4.22–5.81)
RDW: 15.5 % (ref 11.5–15.5)
WBC: 13.9 10*3/uL — AB (ref 4.0–10.5)

## 2017-03-14 LAB — GLUCOSE, CAPILLARY: GLUCOSE-CAPILLARY: 78 mg/dL (ref 65–99)

## 2017-03-14 LAB — HIV ANTIBODY (ROUTINE TESTING W REFLEX): HIV Screen 4th Generation wRfx: NONREACTIVE

## 2017-03-14 MED ORDER — LORATADINE 10 MG PO TABS: 10.0000 mg | ORAL_TABLET | Freq: Every day | ORAL | Status: DC | PRN

## 2017-03-14 MED ORDER — DIPHENHYDRAMINE HCL 25 MG PO CAPS: 25.0000 mg | ORAL_CAPSULE | Freq: Four times a day (QID) | ORAL | Status: DC | PRN

## 2017-03-14 NOTE — Progress Notes (Signed)
Patient Demographics:    Travis Cantrell, is a 57 y.o. male, DOB - 07-02-1960, BPZ:[id number]  Admit date - 03/13/2017   Admitting Physician Ankit Joline Maxcy, MD  Outpatient Primary MD for the patient is Excell Seltzer, MD  LOS - 0   No chief complaint on file.       Subjective:    Travis Cantrell today has no fevers, no emesis,  No chest pain,  No new concerns   Assessment  & Plan :    Active Problems:   UTI (urinary tract infection)  1)Possible Urinary tract infection-patient with chronic indwelling Foley catheter, Foley changed on 03/13/2017, initial urine appears contaminated, will repeat urine collection on culture, continue cefepime pending cultures  2)Multiple sclerosis- - Continue his routine medication including medications for muscle and bladder spasm  3)Hyperlipidemia - Atorvastatin 40 mg daily  4)Essential hypertension- c/n - Lisinopril 20 mg daily  GERD -PPI   DVT prophylaxis: Lovenox  Code Status : full codwe   Disposition Plan  : TBD   DVT Prophylaxis  :  Lovenox   Lab Results  Component Value Date   PLT 347 03/14/2017    Inpatient Medications  Scheduled Meds: . atorvastatin  40 mg Oral Daily  . cholecalciferol  4,000 Units Oral Daily  . enoxaparin (LOVENOX) injection  40 mg Subcutaneous Q24H  . feeding supplement (ENSURE ENLIVE)  237 mL Oral BID WC  . FLUoxetine  20 mg Oral BID  . gabapentin  900 mg Oral TID  . lisinopril  20 mg Oral Daily  . pantoprazole  40 mg Oral Daily   Continuous Infusions: . sodium chloride 100 mL/hr at 03/14/17 1415  . ceFEPime (MAXIPIME) IV Stopped (03/14/17 1010)   PRN Meds:.acetaminophen **OR** acetaminophen, albuterol, alum & mag hydroxide-simeth, bisacodyl, butalbital-acetaminophen-caffeine, diazepam, diphenhydrAMINE, guaiFENesin-dextromethorphan, HYDROcodone-acetaminophen, hydrocortisone, hydrocortisone cream, lip balm,  loratadine, metoCLOPramide, MUSCLE RUB, ondansetron **OR** ondansetron (ZOFRAN) IV, phenol, polyvinyl alcohol, senna-docusate, sodium chloride    Anti-infectives (From admission, onward)   Start     Dose/Rate Route Frequency Ordered Stop   03/13/17 0900  ceFEPIme (MAXIPIME) 2 g in sodium chloride 0.9 % 100 mL IVPB     2 g 200 mL/hr over 30 Minutes Intravenous Every 12 hours 03/13/17 0817     03/13/17 0645  ceFEPIme (MAXIPIME) 2 g in sodium chloride 0.9 % 100 mL IVPB     2 g 200 mL/hr over 30 Minutes Intravenous  Once 03/13/17 0640 03/13/17 0728        Objective:   Vitals:   03/13/17 2120 03/14/17 0540 03/14/17 0939 03/14/17 1325  BP: 91/60 (!) 79/59 91/62 111/64  Pulse: 89 75  79  Resp: 18 18  16   Temp: 98 F (36.7 C) 98.8 F (37.1 C)  98.3 F (36.8 C)  TempSrc: Oral Oral  Oral  SpO2: 100% 98%  100%  Weight:  69.5 kg (153 lb 3.2 oz)    Height:        Wt Readings from Last 3 Encounters:  03/14/17 69.5 kg (153 lb 3.2 oz)  09/29/16 97.5 kg (215 lb)  06/04/16 97.5 kg (215 lb)     Intake/Output Summary (Last 24 hours) at 03/14/2017 1717 Last data filed at 03/14/2017 0935 Gross per 24 hour  Intake 1440 ml  Output 1000 ml  Net 440 ml     Physical Exam  Gen:- Awake Alert,  In no apparent distress  HEENT:- [place].AT, No sclera icterus Neck-Supple Neck,No JVD,.  Lungs-  CTAB  CV- S1, S2 normal Abd-  +ve B.Sounds, Abd Soft, No tenderness,    Extremity/Skin:- No  edema,    NeuroMSK- chronic neuromuscular deficits with atrophy noted GU-Foley catheter    Data Review:   Micro Results Recent Results (from the past 240 hour(s))  Culture, blood (Routine x 2)     Status: None (Preliminary result)   Collection Time: 03/13/17  4:13 AM  Result Value Ref Range Status   Specimen Description   Final    BLOOD LEFT FOREARM Performed at [facility], [address] [place], [place] [address]    Special Requests   Final    BOTTLES DRAWN AEROBIC AND ANAEROBIC  Blood Culture adequate volume Performed at [facility], [address] [place], [place] [address]    Culture   Final    NO GROWTH 1 DAY Performed at [facility], [address], [place], [place] [address]    Report Status PENDING  Incomplete  Culture, blood (Routine x 2)     Status: None (Preliminary result)   Collection Time: 03/13/17  5:08 AM  Result Value Ref Range Status   Specimen Description   Final    BLOOD LEFT FOREARM Performed at [facility], [address], [place], [place] [address]    Special Requests   Final    BOTTLES DRAWN AEROBIC AND ANAEROBIC Blood Culture adequate volume Performed at [facility], [address] [place], [place] [address]    Culture   Final    NO GROWTH 1 DAY Performed at [facility], [address] [place], [place] [address]    Report Status PENDING  Incomplete  Urine culture     Status: Abnormal   Collection Time: 03/13/17  5:35 AM  Result Value Ref Range Status   Specimen Description   Final    URINE, CLEAN CATCH Performed at [facility], [address] [place], [place] [address]    Special Requests   Final    NONE Performed at [facility], [address] [place], [place] [address]    Culture MULTIPLE SPECIES PRESENT, SUGGEST RECOLLECTION (A)  Final   Report Status 03/14/2017 FINAL  Final    Radiology Reports Dg Chest 2 View  Result Date: 03/13/2017 CLINICAL DATA:  Fever, sepsis protocol. EXAM: CHEST  2 VIEW COMPARISON:  Radiograph 07/05/2015 FINDINGS: The cardiomediastinal contours are normal. The lungs are clear. Pulmonary vasculature is normal. No consolidation, pleural effusion, or pneumothorax. No acute osseous abnormalities are seen. Patient's and projects over the lower most right lung base on the second AP image. IMPRESSION: No acute pulmonary process. Electronically Signed   By: Rubye Oaks M.D.   On:  03/13/2017 04:18     CBC Recent Labs  Lab 03/13/17 0413 03/13/17 1139 03/14/17 0402  WBC 25.0* 16.4* 13.9*  HGB 16.6 15.1 13.6  HCT 50.0 46.6 43.7  PLT 390 337 347  MCV 84.0 84.1 86.7  MCH 27.9 27.3 27.0  MCHC 33.2 32.4 31.1  RDW 14.7 14.5 15.5  LYMPHSABS 1.9  --   --   MONOABS 1.8*  --   --   EOSABS 0.0  --   --   BASOSABS 0.0  --   --  Chemistries  Recent Labs  Lab 03/13/17 0413 03/13/17 1139 03/14/17 0402  NA 142  --  139  K 4.2  --  4.1  CL 109  --  110  CO2 22  --  20*  GLUCOSE 145*  --  119*  BUN 23*  --  24*  CREATININE 1.22 1.08 1.06  CALCIUM 9.5  --  8.5*  AST 16  --  9*  ALT 10*  --  8*  ALKPHOS 81  --  57  BILITOT 1.0  --  0.8   ------------------------------------------------------------------------------------------------------------------ No results for input(s): CHOL, HDL, LDLCALC, TRIG, CHOLHDL, LDLDIRECT in the last 72 hours.  Lab Results  Component Value Date   HGBA1C 5.8 04/24/2015   ------------------------------------------------------------------------------------------------------------------ No results for input(s): TSH, T4TOTAL, T3FREE, THYROIDAB in the last 72 hours.  Invalid input(s): FREET3 ------------------------------------------------------------------------------------------------------------------ No results for input(s): VITAMINB12, FOLATE, FERRITIN, TIBC, IRON, RETICCTPCT in the last 72 hours.  Coagulation profile Recent Labs  Lab 03/13/17 0413  INR 1.07    No results for input(s): DDIMER in the last 72 hours.  Cardiac Enzymes No results for input(s): CKMB, TROPONINI, MYOGLOBIN in the last 168 hours.  Invalid input(s): CK ------------------------------------------------------------------------------------------------------------------ No results found for: BNP   Shon Hale M.D on 03/14/2017 at 5:17 PM  Between 7am to 7pm - Pager - [phone]  After 7pm go to [email or web address] - password [email or web address]  [facility] -  Office  [phone]   Voice Recognition Reubin Milan dictation system was used to create this note, attempts have been made to correct errors. Please contact the author with questions and/or clarifications.

## 2017-03-15 LAB — URINE CULTURE: SPECIAL REQUESTS: NORMAL

## 2017-03-15 NOTE — Progress Notes (Signed)
Patient Demographics:    Travis Cantrell, is a 57 y.o. male, DOB - November 15, 1960, ZOX:[id number]  Admit date - 03/13/2017   Admitting Physician Ankit Joline Maxcy, MD  Outpatient Primary MD for the patient is Excell Seltzer, MD  LOS - 1   No chief complaint on file.       Subjective:    Champ Mungo today has no fevers, no emesis,  No chest pain,  Eating well, no abd pain   Assessment  & Plan :    Active Problems:   UTI (urinary tract infection)  1)Possible Urinary tract infection-   symptoms have abated, patient with chronic indwelling Foley catheter, Foley changed on 03/13/2017, initial urine appears contaminated,   repeat urine  Culture from 03/14/17 pending, continue cefepime pending cultures  2)Multiple sclerosis- - Continue his routine medication including medications for muscle and bladder spasm  3)Hyperlipidemia- c/n lipitor 40 mg daily  4)Essential hypertension- c/n Lisinopril 20 mg daily  GERD -PPI   DVT prophylaxis: Lovenox  Code Status : full code   Disposition Plan  : SNF   DVT Prophylaxis  :  Lovenox   Lab Results  Component Value Date   PLT 347 03/14/2017    Inpatient Medications  Scheduled Meds: . atorvastatin  40 mg Oral Daily  . cholecalciferol  4,000 Units Oral Daily  . enoxaparin (LOVENOX) injection  40 mg Subcutaneous Q24H  . feeding supplement (ENSURE ENLIVE)  237 mL Oral BID WC  . FLUoxetine  20 mg Oral BID  . gabapentin  900 mg Oral TID  . lisinopril  20 mg Oral Daily  . pantoprazole  40 mg Oral Daily   Continuous Infusions: . sodium chloride 100 mL/hr at 03/14/17 1415  . ceFEPime (MAXIPIME) IV 2 g (03/15/17 1127)   PRN Meds:.acetaminophen **OR** acetaminophen, albuterol, alum & mag hydroxide-simeth, bisacodyl, butalbital-acetaminophen-caffeine, diazepam, diphenhydrAMINE, guaiFENesin-dextromethorphan, HYDROcodone-acetaminophen, hydrocortisone,  hydrocortisone cream, lip balm, loratadine, metoCLOPramide, MUSCLE RUB, ondansetron **OR** ondansetron (ZOFRAN) IV, phenol, polyvinyl alcohol, senna-docusate, sodium chloride    Anti-infectives (From admission, onward)   Start     Dose/Rate Route Frequency Ordered Stop   03/13/17 0900  ceFEPIme (MAXIPIME) 2 g in sodium chloride 0.9 % 100 mL IVPB     2 g 200 mL/hr over 30 Minutes Intravenous Every 12 hours 03/13/17 0817     03/13/17 0645  ceFEPIme (MAXIPIME) 2 g in sodium chloride 0.9 % 100 mL IVPB     2 g 200 mL/hr over 30 Minutes Intravenous  Once 03/13/17 0640 03/13/17 0728        Objective:   Vitals:   03/14/17 1325 03/14/17 2043 03/15/17 0356 03/15/17 1406  BP: 111/64 94/71 96/66  91/65  Pulse: 79 85 73 78  Resp: 16 18 18 18   Temp: 98.3 F (36.8 C) 98.1 F (36.7 C) 98.3 F (36.8 C) 99.1 F (37.3 C)  TempSrc: Oral Oral Oral Oral  SpO2: 100% 98% 100% 100%  Weight:   72.7 kg (160 lb 4.8 oz)   Height:        Wt Readings from Last 3 Encounters:  03/15/17 72.7 kg (160 lb 4.8 oz)  09/29/16 97.5 kg (215 lb)  06/04/16 97.5 kg (215 lb)     Intake/Output Summary (Last 24 hours)  at 03/15/2017 1629 Last data filed at 03/15/2017 1407 Gross per 24 hour  Intake 1691.67 ml  Output 1150 ml  Net 541.67 ml     Physical Exam  Gen:- Awake Alert, cooperative HEENT:- [place].AT, No sclera icterus Neck-Supple Neck,No JVD,.  Lungs-  CTAB  CV- S1, S2 normal Abd-  +ve B.Sounds, Abd Soft, No tenderness,    Extremity/Skin:- No  edema,  NeuroMSK- chronic neuromuscular deficits with atrophy noted GU-Foley catheter with clearer urine Psych- affect is appropriate   Data Review:   Micro Results Recent Results (from the past 240 hour(s))  Culture, blood (Routine x 2)     Status: None (Preliminary result)   Collection Time: 03/13/17  4:13 AM  Result Value Ref Range Status   Specimen Description   Final    BLOOD LEFT FOREARM Performed at [facility], [address], [place], [place] [address]    Special Requests   Final    BOTTLES DRAWN AEROBIC AND ANAEROBIC Blood Culture adequate volume Performed at [facility], [address] [place], [place] [address]    Culture   Final    NO GROWTH 2 DAYS Performed at [facility], [address] [place], [place] [address]    Report Status PENDING  Incomplete  Culture, blood (Routine x 2)     Status: None (Preliminary result)   Collection Time: 03/13/17  5:08 AM  Result Value Ref Range Status   Specimen Description   Final    BLOOD LEFT FOREARM Performed at [facility], [address] [place], [place] [address]    Special Requests   Final    BOTTLES DRAWN AEROBIC AND ANAEROBIC Blood Culture adequate volume Performed at [facility], [address], [place], [place] [address]    Culture   Final    NO GROWTH 2 DAYS Performed at [facility], [address], [place], [place] [address]    Report Status PENDING  Incomplete  Urine culture     Status: Abnormal   Collection Time: 03/13/17  5:35 AM  Result Value Ref Range Status   Specimen Description   Final    URINE, CLEAN CATCH Performed at [facility], [address], [place], [place] [address]    Special Requests   Final    NONE Performed at [facility], [address], [place], [place] [address]    Culture MULTIPLE SPECIES PRESENT, SUGGEST RECOLLECTION (A)  Final   Report Status 03/14/2017 FINAL  Final  Urine Culture     Status: Abnormal   Collection Time: 03/14/17  1:00 PM  Result Value Ref Range Status   Specimen Description   Final    URINE, CATHETERIZED Performed at [facility], [address] [place], [place] [address]    Special Requests   Final    Normal Performed at [facility], [address] [place], [place] [address]    Culture MULTIPLE SPECIES PRESENT, SUGGEST RECOLLECTION (A)   Final   Report Status 03/15/2017 FINAL  Final    Radiology Reports Dg Chest 2 View  Result Date: 03/13/2017 CLINICAL DATA:  Fever, sepsis protocol. EXAM: CHEST  2 VIEW COMPARISON:  Radiograph 07/05/2015 FINDINGS: The cardiomediastinal contours are normal. The lungs are clear. Pulmonary vasculature is normal. No consolidation, pleural effusion, or pneumothorax. No acute osseous abnormalities are seen. Patient's and projects over the lower most right lung base on the second  AP image. IMPRESSION: No acute pulmonary process. Electronically Signed   By: Rubye Oaks M.D.   On: 03/13/2017 04:18     CBC Recent Labs  Lab 03/13/17 0413 03/13/17 1139 03/14/17 0402  WBC 25.0* 16.4* 13.9*  HGB 16.6 15.1 13.6  HCT 50.0 46.6 43.7  PLT 390 337 347  MCV 84.0 84.1 86.7  MCH 27.9 27.3 27.0  MCHC 33.2 32.4 31.1  RDW 14.7 14.5 15.5  LYMPHSABS 1.9  --   --   MONOABS 1.8*  --   --   EOSABS 0.0  --   --   BASOSABS 0.0  --   --     Chemistries  Recent Labs  Lab 03/13/17 0413 03/13/17 1139 03/14/17 0402  NA 142  --  139  K 4.2  --  4.1  CL 109  --  110  CO2 22  --  20*  GLUCOSE 145*  --  119*  BUN 23*  --  24*  CREATININE 1.22 1.08 1.06  CALCIUM 9.5  --  8.5*  AST 16  --  9*  ALT 10*  --  8*  ALKPHOS 81  --  57  BILITOT 1.0  --  0.8   ------------------------------------------------------------------------------------------------------------------ No results for input(s): CHOL, HDL, LDLCALC, TRIG, CHOLHDL, LDLDIRECT in the last 72 hours.  Lab Results  Component Value Date   HGBA1C 5.8 04/24/2015   ------------------------------------------------------------------------------------------------------------------ No results for input(s): TSH, T4TOTAL, T3FREE, THYROIDAB in the last 72 hours.  Invalid input(s): FREET3 ------------------------------------------------------------------------------------------------------------------ No results for input(s): VITAMINB12, FOLATE,  FERRITIN, TIBC, IRON, RETICCTPCT in the last 72 hours.  Coagulation profile Recent Labs  Lab 03/13/17 0413  INR 1.07    No results for input(s): DDIMER in the last 72 hours.  Cardiac Enzymes No results for input(s): CKMB, TROPONINI, MYOGLOBIN in the last 168 hours.  Invalid input(s): CK ------------------------------------------------------------------------------------------------------------------ No results found for: BNP   Shon Hale M.D on 03/15/2017 at 4:29 PM  Between 7am to 7pm - Pager - [phone]  After 7pm go to [email or web address] - password [email or web address]  [facility] -  Office  [phone]   Voice Recognition Reubin Milan dictation system was used to create this note, attempts have been made to correct errors. Please contact the author with questions and/or clarifications.

## 2017-03-16 LAB — CBC
HCT: 38.9 % — ABNORMAL LOW (ref 39.0–52.0)
HEMOGLOBIN: 11.9 g/dL — AB (ref 13.0–17.0)
MCH: 27.1 pg (ref 26.0–34.0)
MCHC: 30.6 g/dL (ref 30.0–36.0)
MCV: 88.6 fL (ref 78.0–100.0)
PLATELETS: 265 10*3/uL (ref 150–400)
RBC: 4.39 MIL/uL (ref 4.22–5.81)
RDW: 14.9 % (ref 11.5–15.5)
WBC: 10.9 10*3/uL — ABNORMAL HIGH (ref 4.0–10.5)

## 2017-03-16 LAB — BASIC METABOLIC PANEL
Anion gap: 7 (ref 5–15)
BUN: 16 mg/dL (ref 6–20)
CALCIUM: 8.1 mg/dL — AB (ref 8.9–10.3)
CHLORIDE: 111 mmol/L (ref 101–111)
CO2: 23 mmol/L (ref 22–32)
CREATININE: 0.84 mg/dL (ref 0.61–1.24)
Glucose, Bld: 97 mg/dL (ref 65–99)
Potassium: 3.9 mmol/L (ref 3.5–5.1)
SODIUM: 141 mmol/L (ref 135–145)

## 2017-03-16 LAB — GLUCOSE, CAPILLARY: GLUCOSE-CAPILLARY: 86 mg/dL (ref 65–99)

## 2017-03-16 MED ORDER — BISACODYL 10 MG RE SUPP
10.0000 mg | Freq: Once | RECTAL | Status: AC
  Administered 2017-03-16: 10 mg via RECTAL
  Filled 2017-03-16: qty 1

## 2017-03-16 MED ORDER — BISACODYL 10 MG RE SUPP: 10.0000 mg | RECTAL | 0 refills | Status: DC | PRN

## 2017-03-16 MED ORDER — SENNOSIDES-DOCUSATE SODIUM 8.6-50 MG PO TABS: 2.0000 | ORAL_TABLET | Freq: Two times a day (BID) | ORAL | 1 refills | Status: AC

## 2017-03-16 MED ORDER — CEFDINIR 300 MG PO CAPS: 300.0000 mg | ORAL_CAPSULE | Freq: Two times a day (BID) | ORAL | 0 refills | Status: AC

## 2017-03-16 MED ORDER — ENSURE ENLIVE PO LIQD: 237.0000 mL | Freq: Two times a day (BID) | ORAL | 12 refills | Status: DC

## 2017-03-16 MED ORDER — DIPHENHYDRAMINE HCL 25 MG PO CAPS: 50.0000 mg | ORAL_CAPSULE | Freq: Four times a day (QID) | ORAL | 0 refills | Status: DC | PRN

## 2017-03-16 MED ORDER — LACTULOSE 10 GM/15ML PO SOLN
60.0000 g | Freq: Once | ORAL | Status: AC
  Administered 2017-03-16: 60 g via ORAL
  Filled 2017-03-16: qty 90

## 2017-03-16 MED ORDER — BISACODYL 5 MG PO TBEC: 5.0000 mg | DELAYED_RELEASE_TABLET | Freq: Every day | ORAL | 0 refills | Status: AC | PRN

## 2017-03-16 MED ORDER — POLYETHYLENE GLYCOL 3350 17 G PO PACK: 17.0000 g | PACK | Freq: Every day | ORAL | Status: DC

## 2017-03-16 NOTE — Discharge Instructions (Signed)
1) take medications as prescribed 2) home health RN to change Foley catheter on or around 04/02/2017 3) avoid constipation
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
## 2017-03-17 ENCOUNTER — Telehealth: Payer: Self-pay

## 2017-03-17 NOTE — Telephone Encounter (Signed)
Given it is hard for him to get to the office... If he is feeling better, no fever.. And is completing his antibiotics.. He does not need hospital follow up.

## 2017-03-17 NOTE — Telephone Encounter (Signed)
Dr. Ermalene Searing,  Patient recently in hospital for urinary tract infection.  There are no orders for follow up with PCP.  I see that this is a re-occurring problem 2ndary to Foley and he is wheelchair bound.    Is this a patient that you would like to see for follow up for UTI in the office?  He did not appear to have any other complications while in ER.    Please Advise.  Thanks.

## 2017-03-18 LAB — CULTURE, BLOOD (ROUTINE X 2)
Culture: NO GROWTH
Culture: NO GROWTH
Special Requests: ADEQUATE
Special Requests: ADEQUATE

## 2017-03-20 NOTE — Telephone Encounter (Signed)
Spoke with patients daughter, [name].  Chelsea plans to visit patient today, will call back to schedule hospital f/u if patient desires or has symptoms.

## 2017-03-27 NOTE — Telephone Encounter (Signed)
Noted. Thanks.

## 2017-04-13 ENCOUNTER — Other Ambulatory Visit: Payer: Self-pay | Admitting: Family Medicine

## 2017-04-13 NOTE — Telephone Encounter (Signed)
Last office visit 10/28/2016.  Diazepam last refilled 03/14/2017 for #60 with no refills.  Ibuprofen is not on current med list.  States is was discontinued at hospital discharge.  Refill?

## 2017-04-23 ENCOUNTER — Other Ambulatory Visit: Payer: Self-pay | Admitting: Family Medicine

## 2017-04-24 DIAGNOSIS — L89152 Pressure ulcer of sacral region, stage 2: Secondary | ICD-10-CM | POA: Diagnosis not present

## 2017-04-24 NOTE — Telephone Encounter (Signed)
Last office visit  10/28/2016.  Last refilled Ibuprofen not on current medication list.  Diazepam  04/13/17 for #60 with no refills.  Looks like it was set on phone in and did not get called in.

## 2017-04-25 ENCOUNTER — Telehealth: Payer: Self-pay | Admitting: *Deleted

## 2017-04-25 NOTE — Telephone Encounter (Signed)
Received fax from [facility] stating there is a drug interaction between ibuprofen and fluoxetine (increased risk of GI Bleeding).  Please advise.

## 2017-04-25 NOTE — Telephone Encounter (Signed)
Okay to fill as prescribed.. Pt needs to take ibuprofen on full stomach and limit use as much as able.

## 2017-04-25 NOTE — Telephone Encounter (Signed)
[facility] notified via fax.

## 2017-04-27 DIAGNOSIS — L89152 Pressure ulcer of sacral region, stage 2: Secondary | ICD-10-CM | POA: Diagnosis not present

## 2017-05-17 ENCOUNTER — Other Ambulatory Visit: Payer: Self-pay | Admitting: Family Medicine

## 2017-05-17 NOTE — Telephone Encounter (Signed)
Last office visit 10/28/2016.  Last refilled Diazepam & Ibuprofen 04/25/2017.  Gabapentin 03/14/2017 for #360 with 1 refill.  Ok to refill?

## 2017-06-12 ENCOUNTER — Other Ambulatory Visit: Payer: Self-pay | Admitting: Family Medicine

## 2017-06-12 NOTE — Telephone Encounter (Signed)
Last office visit 10/28/2016.  Last refilled 05/18/2017 for #60 with no refills.  Ok to refill?

## 2017-07-04 ENCOUNTER — Other Ambulatory Visit: Payer: Self-pay | Admitting: Family Medicine

## 2017-07-04 NOTE — Telephone Encounter (Signed)
Last office visit 10/28/2016.  Not on current medication list.  Refill?

## 2017-07-04 NOTE — Telephone Encounter (Signed)
Last office visit 10/28/2016.  Ok to refill?

## 2017-07-17 ENCOUNTER — Other Ambulatory Visit: Payer: Self-pay | Admitting: Family Medicine

## 2017-07-18 NOTE — Telephone Encounter (Signed)
Last office visit 10/28/2016.  Last refilled Diazepam-06/13/2017 for #60 with no refills.  Gabapentin-05/18/2017 for #360 with no refills.  Ok to refill?

## 2017-08-14 ENCOUNTER — Other Ambulatory Visit: Payer: Self-pay | Admitting: Family Medicine

## 2017-08-14 NOTE — Telephone Encounter (Signed)
Electronic refill request Last office visit 10/28/16 Medication no longer on list ? Discontinued 03/13/17

## 2017-08-15 NOTE — Telephone Encounter (Signed)
Call pt... Was this med helping his trigeminal neuralgia? If not will not refill.

## 2017-08-16 ENCOUNTER — Other Ambulatory Visit: Payer: Self-pay | Admitting: Family Medicine

## 2017-08-16 NOTE — Telephone Encounter (Signed)
Tried to call patient, no answer and no answering machine. Will try to call back later.

## 2017-08-16 NOTE — Telephone Encounter (Signed)
Tried to call patient again, no answer or machine. Will need to try again later.

## 2017-08-16 NOTE — Telephone Encounter (Signed)
Electronic refill request Last office visit 105/18 Last refill Diazepam 07/18/17 #60 Ibuprofen 05/18/17 #90

## 2017-08-16 NOTE — Telephone Encounter (Addendum)
Left message with step daughter at mobile number to have Travis Cantrell call the office. Okay for PEC/triage nurse to ask patient about medication when he calls back.

## 2017-08-17 NOTE — Telephone Encounter (Signed)
Patient said he thinks the medication is helping with his pain.

## 2017-08-18 DIAGNOSIS — L89152 Pressure ulcer of sacral region, stage 2: Secondary | ICD-10-CM | POA: Diagnosis not present

## 2017-09-18 ENCOUNTER — Other Ambulatory Visit: Payer: Self-pay | Admitting: Family Medicine

## 2017-09-18 NOTE — Telephone Encounter (Signed)
Last office visit 10/28/2016.  No future appointments.   Last refilled Gabapentin-07/18/2017 for #360 with 1 refill.  Ibuprofen-08/17/2017 for #90 with no refills.   Ok to refill?

## 2017-09-20 ENCOUNTER — Other Ambulatory Visit: Payer: Self-pay | Admitting: *Deleted

## 2017-09-20 NOTE — Telephone Encounter (Signed)
Electronic refill request Last office visit 10/28/16 Last refill Diazepam 08/17/17 #60

## 2017-09-21 MED ORDER — DIAZEPAM 5 MG PO TABS: 5.0000 mg | ORAL_TABLET | Freq: Two times a day (BID) | ORAL | 0 refills | Status: DC | PRN

## 2017-10-03 DIAGNOSIS — L8992 Pressure ulcer of unspecified site, stage 2: Secondary | ICD-10-CM | POA: Diagnosis not present

## 2017-10-03 DIAGNOSIS — G35 Multiple sclerosis: Secondary | ICD-10-CM | POA: Diagnosis not present

## 2017-10-03 DIAGNOSIS — L89309 Pressure ulcer of unspecified buttock, unspecified stage: Secondary | ICD-10-CM | POA: Diagnosis not present

## 2017-10-03 DIAGNOSIS — R0602 Shortness of breath: Secondary | ICD-10-CM | POA: Diagnosis not present

## 2017-10-12 ENCOUNTER — Telehealth: Payer: Self-pay | Admitting: Family Medicine

## 2017-10-12 NOTE — Telephone Encounter (Signed)
Pt last seen 10/28/16. No future appt scheduled.

## 2017-10-12 NOTE — Telephone Encounter (Signed)
Copied from CRM [id number]. Topic: Quick Communication - See Telephone Encounter >> Oct 12, 2017  1:18 PM Luanna Cole wrote: CRM for notification. See Telephone encounter for: 10/12/17. Patients friend [name] called and stated that patient would like a referral to water therapy. [facility]. Please advise

## 2017-10-12 NOTE — Telephone Encounter (Signed)
[facility].. How do I order this? Referral for PT?

## 2017-10-13 ENCOUNTER — Other Ambulatory Visit: Payer: Self-pay | Admitting: Family Medicine

## 2017-10-16 NOTE — Telephone Encounter (Signed)
Last office visit 10/28/2016.  No future appointments.  Last refilled 09/21/2017 for both medications with no refills. Ok to refill?

## 2017-10-17 NOTE — Telephone Encounter (Signed)
Pt needs yearly CPX/AMW .. Have him schedule. I will refill once. 

## 2017-10-17 NOTE — Telephone Encounter (Signed)
Left message asking pt to call office  °

## 2017-10-17 NOTE — Telephone Encounter (Signed)
[facility] daughter and [place]. I explained that you cant have both HH and go to Outpatient Aquatic Therapy. This could affect him getting any HH help in the future if we were to send him for this type of outpatient  Therapy, [name] will talk to her father and will explain this to them. She will call back if they decide they need anything else.

## 2017-10-17 NOTE — Telephone Encounter (Signed)
Pt needs yearly CPX/AMW .Marland Kitchen Have him schedule. I will refill once.

## 2017-10-18 ENCOUNTER — Telehealth: Payer: Self-pay | Admitting: Family Medicine

## 2017-10-18 NOTE — Telephone Encounter (Signed)
Medication refills were sent to his pharmacy yesterday 10/17/2017.

## 2017-10-18 NOTE — Telephone Encounter (Signed)
See below crm   Needs refill Appointment 11/8  Copied from CRM [id number]. Topic: General - Other >> Oct 18, 2017 10:39 AM Darletta Moll L wrote: Reason for CRM: [name], patients daughter would like a call back from Zella Ball to discuss setting up cpe for patient due to his time frames for scheduled transportation. Nothing available until December possibly with the times he has transportation. [name] is confirming the times in order to work something out when Zella Ball returns her call. Patient needs his medications renewed. See Karin Lieu CRM from 10/17/2017. >> Oct 18, 2017 11:14 AM Carrie Mew B wrote: Appointment 12/01/17

## 2017-11-14 ENCOUNTER — Other Ambulatory Visit: Payer: Self-pay | Admitting: Family Medicine

## 2017-11-14 NOTE — Telephone Encounter (Signed)
Last office visit 10/28/2016.  CPE scheduled 12/01/2017.  Last refilled Ibuprofen and Diazepam 10/17/2017.  Gabapentin 09/18/2017 for #360 with 1 refill.  Ok to refill?

## 2017-12-01 ENCOUNTER — Encounter: Payer: Self-pay | Admitting: Family Medicine

## 2017-12-01 ENCOUNTER — Ambulatory Visit (INDEPENDENT_AMBULATORY_CARE_PROVIDER_SITE_OTHER): Payer: Medicare Other | Admitting: Family Medicine

## 2017-12-01 ENCOUNTER — Encounter: Payer: Self-pay | Admitting: *Deleted

## 2017-12-01 VITALS — BP 129/98 | HR 84 | Temp 98.4°F

## 2017-12-01 DIAGNOSIS — I10 Essential (primary) hypertension: Secondary | ICD-10-CM | POA: Diagnosis not present

## 2017-12-01 DIAGNOSIS — F331 Major depressive disorder, recurrent, moderate: Secondary | ICD-10-CM

## 2017-12-01 DIAGNOSIS — M62421 Contracture of muscle, right upper arm: Secondary | ICD-10-CM

## 2017-12-01 DIAGNOSIS — Z1329 Encounter for screening for other suspected endocrine disorder: Secondary | ICD-10-CM | POA: Diagnosis not present

## 2017-12-01 DIAGNOSIS — Z1321 Encounter for screening for nutritional disorder: Secondary | ICD-10-CM | POA: Diagnosis not present

## 2017-12-01 DIAGNOSIS — E7800 Pure hypercholesterolemia, unspecified: Secondary | ICD-10-CM

## 2017-12-01 DIAGNOSIS — Z125 Encounter for screening for malignant neoplasm of prostate: Secondary | ICD-10-CM | POA: Diagnosis not present

## 2017-12-01 DIAGNOSIS — Z130 Encounter for screening for diseases of the blood and blood-forming organs and certain disorders involving the immune mechanism: Secondary | ICD-10-CM

## 2017-12-01 DIAGNOSIS — G35 Multiple sclerosis: Secondary | ICD-10-CM

## 2017-12-01 DIAGNOSIS — Z0000 Encounter for general adult medical examination without abnormal findings: Secondary | ICD-10-CM | POA: Diagnosis not present

## 2017-12-01 DIAGNOSIS — Z23 Encounter for immunization: Secondary | ICD-10-CM

## 2017-12-01 LAB — COMPREHENSIVE METABOLIC PANEL
ALBUMIN: 3.9 g/dL (ref 3.5–5.2)
ALT: 8 U/L (ref 0–53)
AST: 9 U/L (ref 0–37)
Alkaline Phosphatase: 83 U/L (ref 39–117)
BUN: 23 mg/dL (ref 6–23)
CO2: 30 mEq/L (ref 19–32)
CREATININE: 1 mg/dL (ref 0.40–1.50)
Calcium: 9.4 mg/dL (ref 8.4–10.5)
Chloride: 105 mEq/L (ref 96–112)
GFR: 81.68 mL/min (ref >60.00)
Glucose, Bld: 96 mg/dL (ref 70–99)
Potassium: 4.3 mEq/L (ref 3.5–5.1)
SODIUM: 142 meq/L (ref 135–145)
TOTAL PROTEIN: 7.1 g/dL (ref 6.0–8.3)
Total Bilirubin: 0.7 mg/dL (ref 0.2–1.2)

## 2017-12-01 LAB — CBC WITH DIFFERENTIAL/PLATELET
BASOS PCT: 1 % (ref 0.0–3.0)
Basophils Absolute: 0.1 10*3/uL (ref 0.0–0.1)
EOS PCT: 2.1 % (ref 0.0–5.0)
Eosinophils Absolute: 0.3 10*3/uL (ref 0.0–0.7)
HEMATOCRIT: 46.6 % (ref 39.0–52.0)
HEMOGLOBIN: 15.2 g/dL (ref 13.0–17.0)
LYMPHS PCT: 19.4 % (ref 12.0–46.0)
Lymphs Abs: 2.5 10*3/uL (ref 0.7–4.0)
MCHC: 32.6 g/dL (ref 30.0–36.0)
MCV: 88.2 fl (ref 78.0–100.0)
MONO ABS: 1 10*3/uL (ref 0.1–1.0)
MONOS PCT: 7.9 % (ref 3.0–12.0)
Neutro Abs: 8.8 10*3/uL — ABNORMAL HIGH (ref 1.4–7.7)
Neutrophils Relative %: 69.6 % (ref 43.0–77.0)
Platelets: 367 10*3/uL (ref 150.0–400.0)
RBC: 5.28 Mil/uL (ref 4.22–5.81)
RDW: 13.6 % (ref 11.5–15.5)
WBC: 12.7 10*3/uL — AB (ref 4.0–10.5)

## 2017-12-01 LAB — LIPID PANEL
CHOLESTEROL: 156 mg/dL (ref 0–200)
HDL: 28.2 mg/dL — ABNORMAL LOW (ref >39.00)
LDL Cholesterol: 104 mg/dL — ABNORMAL HIGH (ref 0–99)
NonHDL: 127.79
Total CHOL/HDL Ratio: 6
Triglycerides: 121 mg/dL (ref 0.0–149.0)
VLDL: 24.2 mg/dL (ref 0.0–40.0)

## 2017-12-01 LAB — T4, FREE: Free T4: 0.92 ng/dL (ref 0.60–1.60)

## 2017-12-01 LAB — T3, FREE: T3 FREE: 3.3 pg/mL (ref 2.3–4.2)

## 2017-12-01 LAB — HEMOGLOBIN A1C: HEMOGLOBIN A1C: 5 % (ref 4.6–6.5)

## 2017-12-01 LAB — PSA, MEDICARE: PSA: 1.08 ng/ml (ref 0.10–4.00)

## 2017-12-01 LAB — VITAMIN D 25 HYDROXY (VIT D DEFICIENCY, FRACTURES): VITD: 35.02 ng/mL (ref 30.00–100.00)

## 2017-12-01 LAB — TSH: TSH: 3.89 u[IU]/mL (ref 0.35–4.50)

## 2017-12-01 LAB — VITAMIN B12: VITAMIN B 12: 341 pg/mL (ref 211–911)

## 2017-12-01 MED ORDER — DOVER HYDROGEL FOLEY CATH 18FR MISC: 11 refills | Status: DC

## 2017-12-01 NOTE — Progress Notes (Addendum)
Subjective:    Patient ID: Travis Cantrell, male    DOB: [birth year], 57 y.o.   MRN: [id number]  HPI The patient presents for annual medicare wellness, complete physical and review of chronic health problems. He/She also has the following acute concerns today:  I have personally reviewed the Medicare Annual Wellness questionnaire and have noted 1. The patient's medical and social history 2. Their use of alcohol, tobacco or illicit drugs 3. Their current medications and supplements 4. The patient's functional ability including ADL's, fall risks, home safety risks and hearing or visual             impairment. 5. Diet and physical activities 6. Evidence for depression or mood disorders 7.         Updated provider list Cognitive evaluation was performed and recorded on pt medicare questionnaire form. The patients weight, height, BMI and visual acuity have been recorded in the chart  I have made referrals, counseling and provided education to the patient based review of the above and I have provided the pt with a written personalized care plan for preventive services.  Documentation of this information was scanned into the electronic record under the media tab.  HTN.. Well controlled at home on lisinopril.Marland Kitchen Has not taken BP med yet today.  Elevated Cholesterol:  Due for re-eval on lipitor 40 mg daily Lab Results  Component Value Date   CHOL 198 04/24/2015   HDL 38.80 (L) 04/24/2015   LDLCALC 185 (H) 11/19/2013   LDLDIRECT 106.0 04/24/2015   TRIG 306.0 (H) 04/24/2015   CHOLHDL 5 04/24/2015  Using medications without problems: Muscle aches:  Diet compliance: he reports good diet, lots of water Exercise: he is trying to get into water aerobics. Other complaints:  MDD, stable on fluoxetine.  Some trouble sleeping at night.   Uses valium for contractures.Marland Kitchen Helps some. gabapentin helps with contracture pain.  He has noted some sediment in urine. Changing foley every 2 weeks.    Caregiver Travis Cantrell helps him with that.  He requires a hospital bed given his MS and inability to move lower extremities and change position. He has a history of bedsores. This patient requires repositioning of the body in a way that cannot be achieved in an ordinary bed. to eliminate pain and reduce pressure. This patient also requires frequent immediate changes in body positing which cannot be achieved in an ordinary bed.   Advance directives and end of life planning reviewed in detail with patient and documented in EMR. Patient given handout on advance care directives if needed. HCPOA and living will updated if needed.  Fall Risk  12/01/2017 05/31/2016 05/18/2016 05/04/2016 04/19/2016  Falls in the past year? 0 (No Data) (No Data) (No Data) (No Data)  Comment - No report of new falls, per report of daughter Travis Cantrell, on [facility] CM written consent No new falls reported today  No new falls reported today No new falls reported by patient today  Risk for fall due to : - - - - -  Risk for fall due to: Comment - - - - -   Depression screen [facility] 2/9 12/01/2017 03/18/2016 03/15/2016  Decreased Interest 0 1 1  Down, Depressed, Hopeless 0 0 0  PHQ - 2 Score 0 1 1    Hearing Screening   Method: Audiometry   125Hz  250Hz  500Hz  1000Hz  2000Hz  3000Hz  4000Hz  6000Hz  8000Hz   Right ear:   20 20 20  20     Left ear:   20 20  20  20     Social History /Family History/Past Medical History reviewed in detail and updated in EMR if needed. Blood pressure (!) 129/98, pulse 84, temperature 98.4 F (36.9 C), temperature source Oral, SpO2 97 %.  Review of Systems  Constitutional: Negative for fatigue and fever.  HENT: Negative for ear pain.   Eyes: Negative for pain.  Respiratory: Negative for cough and shortness of breath.   Cardiovascular: Negative for chest pain, palpitations and leg swelling.  Gastrointestinal: Negative for abdominal pain.  Genitourinary: Negative for dysuria.  Musculoskeletal: Negative for arthralgias.   Neurological: Negative for syncope, light-headedness and headaches.  Psychiatric/Behavioral: Negative for dysphoric mood.       Objective:   Physical Exam  Constitutional: He is oriented to person, place, and time. Vital signs are normal. He appears well-developed and well-nourished.  HENT:  Head: Normocephalic.  Right Ear: Hearing normal. No middle ear effusion.  Left Ear: Hearing normal.  No middle ear effusion.  Nose: Nose normal. No rhinorrhea.  Mouth/Throat: Oropharynx is clear and moist and mucous membranes are normal. No posterior oropharyngeal edema or posterior oropharyngeal erythema.  Eyes: Pupils are equal, round, and reactive to light. Conjunctivae, EOM and lids are normal.  Neck: Trachea normal. No spinous process tenderness and no muscular tenderness present. Carotid bruit is not present. Decreased range of motion present. No erythema present. No thyroid mass and no thyromegaly present.  Cardiovascular: Normal rate, regular rhythm and normal pulses. Exam reveals no gallop, no distant heart sounds and no friction rub.  No murmur heard. Bilateral peripheral edema in feet  Pulmonary/Chest: Effort normal and breath sounds normal. No respiratory distress.  Abdominal: There is tenderness in the suprapubic area.  Full bladder  Musculoskeletal:       Right shoulder: He exhibits decreased range of motion.       Left shoulder: He exhibits decreased range of motion.       Right elbow: He exhibits decreased range of motion.       Left elbow: He exhibits decreased range of motion.       Cervical back: He exhibits decreased range of motion.       Thoracic back: He exhibits decreased range of motion.       Lumbar back: He exhibits decreased range of motion.   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 He displays atrophy. He displays no tremor. No cranial nerve deficit or sensory deficit. He exhibits abnormal muscle  tone. Gait abnormal. Coordination normal.  Upper ext L: shoulder int and ext rot, abdand add: 1/5 Otherwise 0/5 R:shoulder int and ext rot, abdand add: 1/5 Left grip strength 1/5 1/5 wrist  Flexion, 0/5 wrist extension Otherwise 0/5 Lower ext  L:0/5 entire extremity R: 0/5 lower extremity  Skin: Skin is warm, dry and intact. No rash noted.  Very dry flaky skin  Psychiatric: He has a normal mood and affect. His speech is normal and behavior is normal. Thought content normal.          Assessment & Plan:  The patient's preventative maintenance and recommended screening tests for an annual wellness exam were reviewed in full today. Brought up to date unless services declined.  Counselled on the importance of diet, exercise, and its role in overall health and mortality. The patient's FH and SH was reviewed, including their home life, tobacco status, and drug and alcohol status.    Vaccines: Got flu shot  today Prostate Cancer Screen: he requests having this checked. Lab Results  Component Value Date   PSA 1.21 04/24/2015   PSA 0.61 06/22/2011   PSA 0.77 06/27/2008  Colon Cancer Screen:   He requests this screening... plan cologuard      Smoking Status:none ETOH/ drug ZOX:WRUE/AVWU  Hep C: done  HIV screen:   done  EOL info given today.

## 2017-12-01 NOTE — Assessment & Plan Note (Signed)
Well controlled. Continue current medication.  

## 2017-12-01 NOTE — Patient Instructions (Addendum)
Please stop at the lab to have labs drawn.  Follow BP at home.. Goal BP < 140/90. Return cologuard.

## 2017-12-01 NOTE — Assessment & Plan Note (Signed)
Stable on fluoxetine

## 2017-12-01 NOTE — Assessment & Plan Note (Signed)
Stabel on valium

## 2017-12-01 NOTE — Assessment & Plan Note (Signed)
Due for re-eval of labs.

## 2017-12-13 ENCOUNTER — Emergency Department (HOSPITAL_COMMUNITY)
Admission: EM | Admit: 2017-12-13 | Discharge: 2017-12-13 | Disposition: A | Discharge status: Home / Self Care | Payer: Medicare Other | Attending: Emergency Medicine | Admitting: Emergency Medicine

## 2017-12-13 ENCOUNTER — Emergency Department (HOSPITAL_COMMUNITY): Payer: Medicare Other

## 2017-12-13 ENCOUNTER — Encounter (HOSPITAL_COMMUNITY): Payer: Self-pay | Admitting: Emergency Medicine

## 2017-12-13 DIAGNOSIS — Z87891 Personal history of nicotine dependence: Secondary | ICD-10-CM | POA: Insufficient documentation

## 2017-12-13 DIAGNOSIS — R609 Edema, unspecified: Secondary | ICD-10-CM | POA: Diagnosis not present

## 2017-12-13 DIAGNOSIS — Z7982 Long term (current) use of aspirin: Secondary | ICD-10-CM | POA: Diagnosis not present

## 2017-12-13 DIAGNOSIS — R0989 Other specified symptoms and signs involving the circulatory and respiratory systems: Secondary | ICD-10-CM | POA: Diagnosis not present

## 2017-12-13 DIAGNOSIS — I10 Essential (primary) hypertension: Secondary | ICD-10-CM | POA: Insufficient documentation

## 2017-12-13 DIAGNOSIS — Z743 Need for continuous supervision: Secondary | ICD-10-CM | POA: Diagnosis not present

## 2017-12-13 DIAGNOSIS — Z79899 Other long term (current) drug therapy: Secondary | ICD-10-CM | POA: Insufficient documentation

## 2017-12-13 DIAGNOSIS — J029 Acute pharyngitis, unspecified: Secondary | ICD-10-CM | POA: Insufficient documentation

## 2017-12-13 DIAGNOSIS — J189 Pneumonia, unspecified organism: Secondary | ICD-10-CM | POA: Diagnosis not present

## 2017-12-13 DIAGNOSIS — M542 Cervicalgia: Secondary | ICD-10-CM | POA: Diagnosis not present

## 2017-12-13 DIAGNOSIS — J181 Lobar pneumonia, unspecified organism: Secondary | ICD-10-CM | POA: Diagnosis not present

## 2017-12-13 DIAGNOSIS — R29898 Other symptoms and signs involving the musculoskeletal system: Secondary | ICD-10-CM | POA: Diagnosis not present

## 2017-12-13 LAB — I-STAT CHEM 8, ED
BUN: 28 mg/dL — AB (ref 6–20)
CREATININE: 0.9 mg/dL (ref 0.61–1.24)
Calcium, Ion: 1.19 mmol/L (ref 1.15–1.40)
Chloride: 106 mmol/L (ref 98–111)
Glucose, Bld: 96 mg/dL (ref 70–99)
HEMATOCRIT: 46 % (ref 39.0–52.0)
HEMOGLOBIN: 15.6 g/dL (ref 13.0–17.0)
POTASSIUM: 4.1 mmol/L (ref 3.5–5.1)
Sodium: 141 mmol/L (ref 135–145)
TCO2: 27 mmol/L (ref 22–32)

## 2017-12-13 LAB — GROUP A STREP BY PCR: Group A Strep by PCR: NOT DETECTED

## 2017-12-13 MED ORDER — LIDOCAINE VISCOUS HCL 2 % MT SOLN: 15.0000 mL | OROMUCOSAL | 0 refills | Status: DC | PRN

## 2017-12-13 MED ORDER — DOXYCYCLINE HYCLATE 100 MG PO CAPS: 100.0000 mg | ORAL_CAPSULE | Freq: Two times a day (BID) | ORAL | 0 refills | Status: AC

## 2017-12-13 MED ORDER — IOHEXOL 300 MG/ML  SOLN
75.0000 mL | Freq: Once | INTRAMUSCULAR | Status: AC | PRN
  Administered 2017-12-13: 75 mL via INTRAVENOUS

## 2017-12-13 NOTE — ED Notes (Addendum)
Pt states he feels like he has strep throat.

## 2017-12-13 NOTE — ED Notes (Signed)
Out for imaging. °
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
## 2017-12-13 NOTE — ED Notes (Signed)
Patient verbalizes understanding of discharge instructions. Opportunity for questioning and answers were provided. Armband removed by staff, pt discharged from ED.  

## 2017-12-13 NOTE — ED Notes (Signed)
CT notified via phone about IV placement and readiness for scan.

## 2017-12-13 NOTE — ED Notes (Signed)
Attempted to call family to inform of transport. No answer at this time.

## 2017-12-13 NOTE — ED Triage Notes (Signed)
Per EMS: Pt c/o of L sided facial swelling and sore throat x 1 day.  Vital signs are WDL.  Hx of MS, Bronchiatis and HTN. SPO2 is 99% on RA.

## 2017-12-13 NOTE — ED Notes (Signed)
Patient transported to X-ray 

## 2017-12-13 NOTE — ED Notes (Signed)
PTAR called for transportation [place] residence in [place]/ [place] a foley. Dispatcher stated she will "get him in the lineup"

## 2017-12-13 NOTE — Discharge Instructions (Signed)
Swish and spit lidocaine to help with throat discomfort.  Do not swallow. Take antibiotics as directed to help with your pneumonia. You will need to follow-up with your primary care provider for further evaluation. Return to ED for worsening symptoms, trouble breathing or trouble swallowing, chest pain, coughing up blood.

## 2017-12-14 ENCOUNTER — Telehealth: Payer: Self-pay

## 2017-12-14 NOTE — Telephone Encounter (Signed)
Dr Ermalene Searing patient was in the ER for pneumonia on 12/13/17. I have not called patient yet but wanted to see if he needs to follow up within a certain time? There are no 30 minute appointments until December 10th as of today. Please review. Thank you

## 2017-12-15 ENCOUNTER — Other Ambulatory Visit: Payer: Self-pay | Admitting: Family Medicine

## 2017-12-15 NOTE — Telephone Encounter (Signed)
Last office visit 12/01/2017 for MWV.  Last refilled 11/14/2017.  No future appointments.

## 2017-12-15 NOTE — Telephone Encounter (Signed)
Please have him placed in a 15 min OV   Within 1 week after discharge.

## 2017-12-15 NOTE — Telephone Encounter (Signed)
Spoke with patient's daughter, caregiver, and appt made for 12/19/17 at 12 pm. The only could come due to transportation and our availability with schedule

## 2017-12-19 ENCOUNTER — Ambulatory Visit: Payer: Medicare Other | Admitting: Family Medicine

## 2017-12-19 DIAGNOSIS — Z0289 Encounter for other administrative examinations: Secondary | ICD-10-CM

## 2017-12-26 ENCOUNTER — Encounter: Payer: Self-pay | Admitting: Family Medicine

## 2017-12-26 ENCOUNTER — Ambulatory Visit: Payer: Medicare Other | Admitting: Family Medicine

## 2017-12-26 ENCOUNTER — Ambulatory Visit (INDEPENDENT_AMBULATORY_CARE_PROVIDER_SITE_OTHER): Payer: Medicare Other | Admitting: Family Medicine

## 2017-12-26 DIAGNOSIS — J189 Pneumonia, unspecified organism: Secondary | ICD-10-CM

## 2017-12-26 NOTE — Progress Notes (Signed)
   Subjective:    Patient ID: Travis Cantrell, male    DOB: March 06, 1960, 57 y.o.   MRN: [id number]  HPI 57 year old wheelchair bound male with MS presents for follow up ER visit.   Seen on  12/13/2017 in [place] for left sided ST and intermittent cough. I-STAT Chem-8 is unremarkable.  Strep test is negative.  CT of the neck was done to rule out abscess causing one-sided tonsillar enlargement....negative.  Chest x-ray did show possible right-sided pneumonia.  Treated with doxycycline for community-acquired pneumonia, give lidocaine to swish and spit for viral pharyngitis.     Today pt reports improvement in soreness of left throat, no further coughing.  No fever. NO SOB. Lidocaine to swish and spit has been helping with symptoms. No issue swallowing.  Blood pressure 112/88, pulse 100, temperature 97.8 F (36.6 C), temperature source Oral, SpO2 94 %. Social History /Family History/Past Medical History reviewed in detail and updated in EMR if needed.  Review of Systems  Constitutional: Negative for fatigue and fever.  HENT: Positive for sore throat. Negative for ear pain.   Eyes: Negative for pain.  Respiratory: Negative for cough and shortness of breath.   Cardiovascular: Negative for chest pain, palpitations and leg swelling.  Gastrointestinal: Negative for abdominal pain.  Genitourinary: Negative for dysuria.  Musculoskeletal: Negative for arthralgias.  Neurological: Negative for syncope, light-headedness and headaches.  Psychiatric/Behavioral: Negative for dysphoric mood.       Objective:   Physical Exam  Constitutional: Vital signs are normal.  Non-toxic appearance. He does not appear ill. No distress.  Wheelchair bound  HENT:  Head: Normocephalic and atraumatic.  Right Ear: Hearing, tympanic membrane, external ear and ear canal normal. No tenderness. No foreign bodies. Tympanic membrane is not retracted and not bulging.  Left Ear: Hearing, tympanic membrane, external ear and  ear canal normal. No tenderness. No foreign bodies. Tympanic membrane is not retracted and not bulging.  Nose: Nose normal. No mucosal edema or rhinorrhea. Right sinus exhibits no maxillary sinus tenderness and no frontal sinus tenderness. Left sinus exhibits no maxillary sinus tenderness and no frontal sinus tenderness.  Mouth/Throat: Uvula is midline, oropharynx is clear and moist and mucous membranes are normal. Normal dentition. No dental caries. No oropharyngeal exudate or tonsillar abscesses.  Eyes: Pupils are equal, round, and reactive to light. Conjunctivae, EOM and lids are normal. Lids are everted and swept, no foreign bodies found.  Neck: Trachea normal and phonation normal. Carotid bruit is not present. No neck rigidity. Decreased range of motion present. No thyroid mass and no thyromegaly present.  Neck movement at baseline  Cardiovascular: Normal rate, regular rhythm, S1 normal, S2 normal, normal heart sounds, intact distal pulses and normal pulses. Exam reveals no gallop.  No murmur heard. Pulmonary/Chest: Effort normal and breath sounds normal. No respiratory distress. He has no wheezes. He has no rhonchi. He has no rales.  Abdominal: Soft. Normal appearance and bowel sounds are normal. There is no hepatosplenomegaly. There is no tenderness. There is no rebound, no guarding and no CVA tenderness. No hernia.  Neurological: He is alert. He has normal reflexes.  Skin: Skin is warm, dry and intact. No rash noted.  Psychiatric: He has a normal mood and affect. His speech is normal and behavior is normal. Judgment normal.          Assessment & Plan:

## 2017-12-26 NOTE — Patient Instructions (Signed)
Rest and fluids.  Given symptoms more time to resolve complete.  Call if worsening sort throat, fever or shortness of breath.

## 2017-12-26 NOTE — Assessment & Plan Note (Signed)
Improved S/P antibiotics.   Rest, fluids and symptomatic care.

## 2017-12-29 DIAGNOSIS — L89152 Pressure ulcer of sacral region, stage 2: Secondary | ICD-10-CM | POA: Diagnosis not present

## 2018-01-15 ENCOUNTER — Other Ambulatory Visit: Payer: Self-pay | Admitting: Family Medicine

## 2018-01-15 NOTE — Telephone Encounter (Signed)
Name of Medication: Gabapentin, Diazepam ad Ibuprofen Name of Pharmacy: [facility] Last Fill or Written Date and Quantity: last refills Gabapentin #360/1 11/14/17, Diazepam #60  11/25/17, Ibuprofen 12/15/17 #90 Last Office Visit and Type: 12/26/17 acute Next Office Visit and Type: none Last Controlled Substance Agreement Date: 11/19/13 Last UDS:11/19/13

## 2018-01-26 ENCOUNTER — Emergency Department
Admission: EM | Admit: 2018-01-26 | Discharge: 2018-01-26 | Disposition: A | Discharge status: Home / Self Care | Payer: Medicare Other | Attending: Emergency Medicine | Admitting: Emergency Medicine

## 2018-01-26 ENCOUNTER — Other Ambulatory Visit: Payer: Self-pay

## 2018-01-26 DIAGNOSIS — Z7401 Bed confinement status: Secondary | ICD-10-CM | POA: Diagnosis not present

## 2018-01-26 DIAGNOSIS — Z79899 Other long term (current) drug therapy: Secondary | ICD-10-CM | POA: Insufficient documentation

## 2018-01-26 DIAGNOSIS — R52 Pain, unspecified: Secondary | ICD-10-CM | POA: Diagnosis not present

## 2018-01-26 DIAGNOSIS — Z87891 Personal history of nicotine dependence: Secondary | ICD-10-CM | POA: Diagnosis not present

## 2018-01-26 DIAGNOSIS — I10 Essential (primary) hypertension: Secondary | ICD-10-CM | POA: Diagnosis not present

## 2018-01-26 DIAGNOSIS — K0889 Other specified disorders of teeth and supporting structures: Secondary | ICD-10-CM | POA: Insufficient documentation

## 2018-01-26 DIAGNOSIS — R509 Fever, unspecified: Secondary | ICD-10-CM | POA: Diagnosis not present

## 2018-01-26 DIAGNOSIS — G35 Multiple sclerosis: Secondary | ICD-10-CM | POA: Insufficient documentation

## 2018-01-26 DIAGNOSIS — Z7982 Long term (current) use of aspirin: Secondary | ICD-10-CM | POA: Insufficient documentation

## 2018-01-26 DIAGNOSIS — M2550 Pain in unspecified joint: Secondary | ICD-10-CM | POA: Diagnosis not present

## 2018-01-26 DIAGNOSIS — R609 Edema, unspecified: Secondary | ICD-10-CM | POA: Diagnosis not present

## 2018-01-26 MED ORDER — PENICILLIN V POTASSIUM 500 MG PO TABS: 500.0000 mg | ORAL_TABLET | Freq: Four times a day (QID) | ORAL | 0 refills | Status: AC

## 2018-01-26 MED ORDER — TRAMADOL HCL 50 MG PO TABS: 50.0000 mg | ORAL_TABLET | Freq: Four times a day (QID) | ORAL | 0 refills | Status: DC | PRN

## 2018-01-26 MED ORDER — BUPIVACAINE HCL (PF) 0.5 % IJ SOLN
30.0000 mL | Freq: Once | INTRAMUSCULAR | Status: AC
  Administered 2018-01-26: 30 mL
  Filled 2018-01-26: qty 30
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
## 2018-01-26 NOTE — Discharge Instructions (Addendum)
Please seek medical attention for any high fevers, chest pain, shortness of breath, change in behavior, persistent vomiting, bloody stool or any other new or concerning symptoms.  

## 2018-01-26 NOTE — ED Notes (Signed)
Pt taken back home via EMS, pt has MS and is unable to ambulate. VSS. NAD. Discharge instructions and follow up reviewed with pt. All questions and concerns addressed.

## 2018-01-26 NOTE — ED Triage Notes (Signed)
Pt to ED via EMS from home. PT c/o dental pain for 3 days. History of cavitites and having teeth pulled. Pt stable at this time

## 2018-02-01 ENCOUNTER — Telehealth: Payer: Self-pay | Admitting: *Deleted

## 2018-02-01 NOTE — Telephone Encounter (Signed)
Received fax from [employer] stating patient has not completed the Cologuard test.  Left message for Travis Cantrell (daughter) asking her to encourage/help Travis Cantrell collect and return his Cologuard test.  I also ask that if she does not feel like he will be able to complete this test, to call and let me know that as well.

## 2018-02-06 ENCOUNTER — Other Ambulatory Visit: Payer: Self-pay | Admitting: Family Medicine

## 2018-02-06 NOTE — Telephone Encounter (Signed)
Last office visit 12/26/2017 for hospital follow up.  Last refilled 01/16/2018 for one month supply.   No future appointment.

## 2018-02-06 NOTE — Telephone Encounter (Signed)
Pt's daughter in law returned call to donna. She did say she was not aware of the cologuard test and that she would be going by his house today to make sure he completes the test.. She is also requesting to be contacted after appointments for updates so that she is in the loop.

## 2018-02-06 NOTE — Telephone Encounter (Signed)
Noted  

## 2018-02-09 ENCOUNTER — Other Ambulatory Visit: Payer: Self-pay | Admitting: Family Medicine

## 2018-02-14 ENCOUNTER — Telehealth: Payer: Self-pay | Admitting: *Deleted

## 2018-02-14 DIAGNOSIS — G35 Multiple sclerosis: Secondary | ICD-10-CM

## 2018-02-14 NOTE — Telephone Encounter (Signed)
Spoke with [place].  She is asking for an order for a mesh sling with commode opening, to help with bathing and using the restroom.  Send that order to [facility].  She would also like an order for catheters and supplies so she can get 90 day supply from [facility] again.  (see previous [facility] order in your in box to go by).

## 2018-02-14 NOTE — Telephone Encounter (Signed)
Spoke to pts daughter, [name], who is requesting a call back from [place].

## 2018-02-15 NOTE — Telephone Encounter (Signed)
Prescription for requested supplies in Travis Cantrell's basket in my office. Please look into whether face to face needed for hospital bed.

## 2018-02-15 NOTE — Telephone Encounter (Signed)
Chelsea said [facility] said hospital bed was out of warranty and a new rx for electric hospital bed with documentation why needs hospital bed needs to be sent to [facility]. Chelsea request cb from [place].

## 2018-02-16 NOTE — Addendum Note (Signed)
Addended byKerby Nora E on: 02/16/2018 05:09 PM   Modules accepted: Orders

## 2018-02-16 NOTE — Addendum Note (Signed)
Addended by: Damita Lack on: 02/16/2018 09:37 AM   Modules accepted: Orders

## 2018-02-16 NOTE — Telephone Encounter (Signed)
Needs referral to neuro to assess and treat trigeminal neuralgia. Very common in MS.

## 2018-02-16 NOTE — Telephone Encounter (Signed)
Spoke to pts step daughter [name] who states pt went to the dentist, and has no abscess or cavities. She states she has been researching and is wanting to know if pt could possibly have "trigeminal neuralgia" and is questioning if he can be tested for it or receive a referral to neurology. pls advise

## 2018-02-16 NOTE — Telephone Encounter (Addendum)
Order for Mesh Sling faxed to [facility] at [phone].  Order for catheter supplies faxed to [facility] at [phone].  I have sent Travis Cantrell a message in Epic asking what we need to do as far as documentation for the hospital bed.  Travis Cantrell notified that orders have been faxed for sling and catheter supplies.  Advised just waiting to hear back for [facility] with guidance for the hospital bed.

## 2018-02-16 NOTE — Telephone Encounter (Signed)
Let meknow if he wants to move ahead with referral and where preference.

## 2018-02-16 NOTE — Telephone Encounter (Signed)
Spoke with patient's step daughter, [name]. She is agreeable to the referral. She needs this to be in [place] due to the transportation for the patient. Discussed that there is [facility] and [facility] that we usually use. Chelsea was ok with either and wanted to see if Dr. Ermalene Searing had a preference on a certain provider. Please review. Please send this back to me and I will go ahead and get referral worked on. For appointment's to please call [place]. Thank you.

## 2018-02-19 DIAGNOSIS — L89152 Pressure ulcer of sacral region, stage 2: Secondary | ICD-10-CM | POA: Diagnosis not present

## 2018-02-19 NOTE — Telephone Encounter (Signed)
Received fax from [facility] stating that [name] doesn't have a hoyer lift with/through [facility] so they can not provide the sling for a piece of equipment they didn't provide.  Left message for [facility] with the above information.

## 2018-02-22 DIAGNOSIS — L89152 Pressure ulcer of sacral region, stage 2: Secondary | ICD-10-CM | POA: Diagnosis not present

## 2018-03-07 ENCOUNTER — Other Ambulatory Visit: Payer: Self-pay

## 2018-03-07 MED ORDER — ALBUTEROL SULFATE (2.5 MG/3ML) 0.083% IN NEBU: 2.5000 mg | INHALATION_SOLUTION | RESPIRATORY_TRACT | 11 refills | Status: DC | PRN

## 2018-03-07 NOTE — Telephone Encounter (Signed)
Travis Cantrell  Put pt on phone. Pt requesting refill albuterol neb solution; last filled # 75 ml x 12 on 02/18/15 by Dr Luberta Mutter (pt said was doctor in hospital). Pt just ran out of refills. Pt last seen 12/26/17. No future appt scheduled. Please advise.[facility] [address]

## 2018-03-13 ENCOUNTER — Ambulatory Visit: Payer: Self-pay | Admitting: Neurology

## 2018-03-16 ENCOUNTER — Other Ambulatory Visit: Payer: Self-pay | Admitting: Family Medicine

## 2018-03-16 DIAGNOSIS — R0602 Shortness of breath: Secondary | ICD-10-CM | POA: Diagnosis not present

## 2018-03-16 DIAGNOSIS — G35 Multiple sclerosis: Secondary | ICD-10-CM | POA: Diagnosis not present

## 2018-03-16 NOTE — Telephone Encounter (Signed)
Last office visit 12/26/2017 for hospital follow up.  Last refilled Gabapentin 02/06/2018 for #360 with no refills.  Ibuprofen 02/06/2018 for #90 with no refills.  Diazepam 02/06/2018 for #60 with no refills.  No future appointments.

## 2018-03-19 ENCOUNTER — Other Ambulatory Visit: Payer: Self-pay | Admitting: Family Medicine

## 2018-03-20 ENCOUNTER — Encounter: Payer: Self-pay | Admitting: Neurology

## 2018-03-20 ENCOUNTER — Ambulatory Visit: Payer: Medicare Other | Admitting: Neurology

## 2018-03-20 VITALS — BP 166/100 | HR 67

## 2018-03-20 DIAGNOSIS — R51 Headache: Secondary | ICD-10-CM

## 2018-03-20 DIAGNOSIS — R519 Headache, unspecified: Secondary | ICD-10-CM

## 2018-03-20 DIAGNOSIS — G8250 Quadriplegia, unspecified: Secondary | ICD-10-CM | POA: Insufficient documentation

## 2018-03-20 DIAGNOSIS — G500 Trigeminal neuralgia: Secondary | ICD-10-CM

## 2018-03-20 DIAGNOSIS — G35 Multiple sclerosis: Secondary | ICD-10-CM | POA: Diagnosis not present

## 2018-03-20 DIAGNOSIS — N319 Neuromuscular dysfunction of bladder, unspecified: Secondary | ICD-10-CM | POA: Diagnosis not present

## 2018-03-20 DIAGNOSIS — R5383 Other fatigue: Secondary | ICD-10-CM | POA: Diagnosis not present

## 2018-03-20 MED ORDER — OXCARBAZEPINE 300 MG PO TABS: 300.0000 mg | ORAL_TABLET | Freq: Two times a day (BID) | ORAL | 5 refills | Status: DC

## 2018-03-20 MED ORDER — BACLOFEN 10 MG PO TABS: ORAL_TABLET | ORAL | 5 refills | Status: DC
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
## 2018-03-22 ENCOUNTER — Other Ambulatory Visit: Payer: Self-pay

## 2018-03-22 ENCOUNTER — Inpatient Hospital Stay (HOSPITAL_COMMUNITY): Payer: Medicare Other

## 2018-03-22 ENCOUNTER — Inpatient Hospital Stay (HOSPITAL_COMMUNITY)
Admission: EM | Admit: 2018-03-22 | Discharge: 2018-03-28 | DRG: 091 | Disposition: A | Discharge status: Home Health | Payer: Medicare Other | Attending: Internal Medicine | Admitting: Internal Medicine

## 2018-03-22 ENCOUNTER — Encounter (HOSPITAL_COMMUNITY): Payer: Self-pay | Admitting: Neurology

## 2018-03-22 DIAGNOSIS — E861 Hypovolemia: Secondary | ICD-10-CM | POA: Diagnosis present

## 2018-03-22 DIAGNOSIS — R8271 Bacteriuria: Secondary | ICD-10-CM | POA: Diagnosis not present

## 2018-03-22 DIAGNOSIS — R3129 Other microscopic hematuria: Secondary | ICD-10-CM | POA: Diagnosis not present

## 2018-03-22 DIAGNOSIS — I10 Essential (primary) hypertension: Secondary | ICD-10-CM | POA: Diagnosis not present

## 2018-03-22 DIAGNOSIS — K5900 Constipation, unspecified: Secondary | ICD-10-CM | POA: Diagnosis not present

## 2018-03-22 DIAGNOSIS — T50915A Adverse effect of multiple unspecified drugs, medicaments and biological substances, initial encounter: Secondary | ICD-10-CM | POA: Diagnosis not present

## 2018-03-22 DIAGNOSIS — G928 Other toxic encephalopathy: Secondary | ICD-10-CM

## 2018-03-22 DIAGNOSIS — T510X1A Toxic effect of ethanol, accidental (unintentional), initial encounter: Secondary | ICD-10-CM | POA: Diagnosis present

## 2018-03-22 DIAGNOSIS — F1010 Alcohol abuse, uncomplicated: Secondary | ICD-10-CM | POA: Diagnosis present

## 2018-03-22 DIAGNOSIS — Y92009 Unspecified place in unspecified non-institutional (private) residence as the place of occurrence of the external cause: Secondary | ICD-10-CM

## 2018-03-22 DIAGNOSIS — Z833 Family history of diabetes mellitus: Secondary | ICD-10-CM

## 2018-03-22 DIAGNOSIS — G35 Multiple sclerosis: Secondary | ICD-10-CM | POA: Diagnosis not present

## 2018-03-22 DIAGNOSIS — G92 Toxic encephalopathy: Secondary | ICD-10-CM | POA: Diagnosis not present

## 2018-03-22 DIAGNOSIS — F331 Major depressive disorder, recurrent, moderate: Secondary | ICD-10-CM | POA: Diagnosis present

## 2018-03-22 DIAGNOSIS — K219 Gastro-esophageal reflux disease without esophagitis: Secondary | ICD-10-CM | POA: Diagnosis not present

## 2018-03-22 DIAGNOSIS — E860 Dehydration: Secondary | ICD-10-CM | POA: Diagnosis present

## 2018-03-22 DIAGNOSIS — G9341 Metabolic encephalopathy: Secondary | ICD-10-CM

## 2018-03-22 DIAGNOSIS — N319 Neuromuscular dysfunction of bladder, unspecified: Secondary | ICD-10-CM | POA: Diagnosis not present

## 2018-03-22 DIAGNOSIS — G8250 Quadriplegia, unspecified: Secondary | ICD-10-CM | POA: Diagnosis not present

## 2018-03-22 DIAGNOSIS — Z87440 Personal history of urinary (tract) infections: Secondary | ICD-10-CM

## 2018-03-22 DIAGNOSIS — Z7401 Bed confinement status: Secondary | ICD-10-CM | POA: Diagnosis not present

## 2018-03-22 DIAGNOSIS — A419 Sepsis, unspecified organism: Secondary | ICD-10-CM | POA: Diagnosis not present

## 2018-03-22 DIAGNOSIS — N179 Acute kidney failure, unspecified: Secondary | ICD-10-CM

## 2018-03-22 DIAGNOSIS — R6520 Severe sepsis without septic shock: Secondary | ICD-10-CM | POA: Diagnosis not present

## 2018-03-22 DIAGNOSIS — Z87891 Personal history of nicotine dependence: Secondary | ICD-10-CM

## 2018-03-22 DIAGNOSIS — Z8249 Family history of ischemic heart disease and other diseases of the circulatory system: Secondary | ICD-10-CM

## 2018-03-22 DIAGNOSIS — Z960 Presence of urogenital implants: Secondary | ICD-10-CM

## 2018-03-22 DIAGNOSIS — I959 Hypotension, unspecified: Secondary | ICD-10-CM | POA: Diagnosis not present

## 2018-03-22 DIAGNOSIS — G500 Trigeminal neuralgia: Secondary | ICD-10-CM | POA: Diagnosis not present

## 2018-03-22 DIAGNOSIS — T83511A Infection and inflammatory reaction due to indwelling urethral catheter, initial encounter: Secondary | ICD-10-CM

## 2018-03-22 DIAGNOSIS — N390 Urinary tract infection, site not specified: Secondary | ICD-10-CM | POA: Diagnosis present

## 2018-03-22 DIAGNOSIS — R509 Fever, unspecified: Secondary | ICD-10-CM | POA: Diagnosis not present

## 2018-03-22 DIAGNOSIS — M50222 Other cervical disc displacement at C5-C6 level: Secondary | ICD-10-CM | POA: Diagnosis not present

## 2018-03-22 DIAGNOSIS — M50223 Other cervical disc displacement at C6-C7 level: Secondary | ICD-10-CM | POA: Diagnosis not present

## 2018-03-22 DIAGNOSIS — R410 Disorientation, unspecified: Secondary | ICD-10-CM | POA: Diagnosis not present

## 2018-03-22 DIAGNOSIS — E785 Hyperlipidemia, unspecified: Secondary | ICD-10-CM | POA: Diagnosis not present

## 2018-03-22 DIAGNOSIS — R5381 Other malaise: Secondary | ICD-10-CM | POA: Diagnosis not present

## 2018-03-22 DIAGNOSIS — M2550 Pain in unspecified joint: Secondary | ICD-10-CM | POA: Diagnosis not present

## 2018-03-22 LAB — COMPREHENSIVE METABOLIC PANEL
ALT: 11 U/L (ref 0–44)
ANION GAP: 11 (ref 5–15)
AST: 13 U/L — ABNORMAL LOW (ref 15–41)
Albumin: 3.2 g/dL — ABNORMAL LOW (ref 3.5–5.0)
Alkaline Phosphatase: 62 U/L (ref 38–126)
BUN: 41 mg/dL — ABNORMAL HIGH (ref 6–20)
CO2: 18 mmol/L — ABNORMAL LOW (ref 22–32)
Calcium: 8.3 mg/dL — ABNORMAL LOW (ref 8.9–10.3)
Chloride: 109 mmol/L (ref 98–111)
Creatinine, Ser: 1.65 mg/dL — ABNORMAL HIGH (ref 0.61–1.24)
GFR calc Af Amer: 53 mL/min — ABNORMAL LOW (ref >60)
GFR calc non Af Amer: 45 mL/min — ABNORMAL LOW (ref >60)
Glucose, Bld: 78 mg/dL (ref 70–99)
POTASSIUM: 3.5 mmol/L (ref 3.5–5.1)
Sodium: 138 mmol/L (ref 135–145)
Total Bilirubin: 0.6 mg/dL (ref 0.3–1.2)
Total Protein: 6.2 g/dL — ABNORMAL LOW (ref 6.5–8.1)

## 2018-03-22 LAB — URINALYSIS, COMPLETE (UACMP) WITH MICROSCOPIC
BILIRUBIN URINE: NEGATIVE
Glucose, UA: NEGATIVE mg/dL
Ketones, ur: NEGATIVE mg/dL
Nitrite: NEGATIVE
Protein, ur: NEGATIVE mg/dL
Specific Gravity, Urine: 1.012 (ref 1.005–1.030)
WBC, UA: >50 WBC/hpf — ABNORMAL HIGH (ref 0–5)
pH: 5 (ref 5.0–8.0)

## 2018-03-22 LAB — CBC WITH DIFFERENTIAL/PLATELET
Abs Immature Granulocytes: 0 10*3/uL (ref 0.00–0.07)
Basophils Absolute: 0.4 10*3/uL — ABNORMAL HIGH (ref 0.0–0.1)
Basophils Relative: 2 %
Eosinophils Absolute: 0 10*3/uL (ref 0.0–0.5)
Eosinophils Relative: 0 %
HCT: 45.6 % (ref 39.0–52.0)
Hemoglobin: 13.9 g/dL (ref 13.0–17.0)
Lymphocytes Relative: 25 %
Lymphs Abs: 4.9 10*3/uL — ABNORMAL HIGH (ref 0.7–4.0)
MCH: 29 pg (ref 26.0–34.0)
MCHC: 30.5 g/dL (ref 30.0–36.0)
MCV: 95 fL (ref 80.0–100.0)
Monocytes Absolute: 1.8 10*3/uL — ABNORMAL HIGH (ref 0.1–1.0)
Monocytes Relative: 9 %
NEUTROS ABS: 12.6 10*3/uL — AB (ref 1.7–7.7)
NRBC: 0 /100{WBCs}
Neutrophils Relative %: 64 %
Platelets: 305 10*3/uL (ref 150–400)
RBC: 4.8 MIL/uL (ref 4.22–5.81)
RDW: 13.4 % (ref 11.5–15.5)
WBC: 19.7 10*3/uL — ABNORMAL HIGH (ref 4.0–10.5)
nRBC: 0 % (ref 0.0–0.2)

## 2018-03-22 LAB — ETHANOL: ALCOHOL ETHYL (B): 78 mg/dL — AB (ref <10)

## 2018-03-22 LAB — RAPID URINE DRUG SCREEN, HOSP PERFORMED
AMPHETAMINES: NOT DETECTED
Barbiturates: NOT DETECTED
Benzodiazepines: NOT DETECTED
Cocaine: NOT DETECTED
Opiates: NOT DETECTED
TETRAHYDROCANNABINOL: POSITIVE — AB

## 2018-03-22 LAB — AMMONIA: Ammonia: 28 umol/L (ref 9–35)

## 2018-03-22 LAB — LACTIC ACID, PLASMA: Lactic Acid, Venous: 2.5 mmol/L (ref 0.5–1.9)

## 2018-03-22 MED ORDER — NYSTATIN 100000 UNIT/GM EX POWD
1.0000 g | Freq: Four times a day (QID) | CUTANEOUS | Status: DC
  Administered 2018-03-23 – 2018-03-28 (×23): 1 g via TOPICAL
  Filled 2018-03-22: qty 15

## 2018-03-22 MED ORDER — ASPIRIN-ACETAMINOPHEN-CAFFEINE 250-250-65 MG PO TABS
1.0000 | ORAL_TABLET | Freq: Four times a day (QID) | ORAL | Status: DC | PRN
  Filled 2018-03-22: qty 1

## 2018-03-22 MED ORDER — DIAZEPAM 5 MG PO TABS: 5.0000 mg | ORAL_TABLET | Freq: Two times a day (BID) | ORAL | Status: DC | PRN

## 2018-03-22 MED ORDER — IBUPROFEN 200 MG PO TABS: 800.0000 mg | ORAL_TABLET | Freq: Three times a day (TID) | ORAL | Status: DC | PRN

## 2018-03-22 MED ORDER — THIAMINE HCL 100 MG/ML IJ SOLN
100.0000 mg | Freq: Once | INTRAMUSCULAR | Status: AC
  Administered 2018-03-22: 100 mg via INTRAVENOUS
  Filled 2018-03-22: qty 2

## 2018-03-22 MED ORDER — CARBAMAZEPINE 200 MG PO TABS
200.0000 mg | ORAL_TABLET | Freq: Three times a day (TID) | ORAL | Status: DC
  Administered 2018-03-23 – 2018-03-24 (×6): 200 mg via ORAL
  Filled 2018-03-22 (×8): qty 1

## 2018-03-22 MED ORDER — LORATADINE 10 MG PO TABS: 10.0000 mg | ORAL_TABLET | Freq: Every day | ORAL | Status: DC | PRN

## 2018-03-22 MED ORDER — VITAMIN C 500 MG PO TABS
250.0000 mg | ORAL_TABLET | Freq: Every day | ORAL | Status: DC
  Administered 2018-03-23 – 2018-03-28 (×6): 250 mg via ORAL
  Filled 2018-03-22 (×6): qty 1

## 2018-03-22 MED ORDER — BUTALBITAL-APAP-CAFFEINE 50-325-40 MG PO TABS: 1.0000 | ORAL_TABLET | Freq: Four times a day (QID) | ORAL | Status: DC | PRN

## 2018-03-22 MED ORDER — SODIUM CHLORIDE 0.9 % IV SOLN
2.0000 g | Freq: Once | INTRAVENOUS | Status: AC
  Administered 2018-03-22: 2 g via INTRAVENOUS
  Filled 2018-03-22: qty 2

## 2018-03-22 MED ORDER — ONDANSETRON HCL 4 MG/2ML IJ SOLN: 4.0000 mg | Freq: Four times a day (QID) | INTRAMUSCULAR | Status: DC | PRN

## 2018-03-22 MED ORDER — LISINOPRIL 20 MG PO TABS
20.0000 mg | ORAL_TABLET | Freq: Every day | ORAL | Status: DC
  Administered 2018-03-23 – 2018-03-25 (×3): 20 mg via ORAL
  Filled 2018-03-22 (×3): qty 1

## 2018-03-22 MED ORDER — SODIUM CHLORIDE 0.9 % IV SOLN
2.0000 g | Freq: Two times a day (BID) | INTRAVENOUS | Status: AC
  Administered 2018-03-23 – 2018-03-26 (×8): 2 g via INTRAVENOUS
  Filled 2018-03-22 (×8): qty 2

## 2018-03-22 MED ORDER — BISACODYL 10 MG RE SUPP: 10.0000 mg | RECTAL | Status: DC | PRN

## 2018-03-22 MED ORDER — SODIUM CHLORIDE 0.45 % IV SOLN
INTRAVENOUS | Status: DC
  Administered 2018-03-23: via INTRAVENOUS

## 2018-03-22 MED ORDER — ADULT MULTIVITAMIN W/MINERALS CH
1.0000 | ORAL_TABLET | Freq: Every day | ORAL | Status: DC
  Administered 2018-03-23 – 2018-03-28 (×6): 1 via ORAL
  Filled 2018-03-22 (×6): qty 1

## 2018-03-22 MED ORDER — TRAMADOL HCL 50 MG PO TABS: 50.0000 mg | ORAL_TABLET | Freq: Four times a day (QID) | ORAL | Status: DC | PRN

## 2018-03-22 MED ORDER — ONDANSETRON HCL 4 MG PO TABS: 4.0000 mg | ORAL_TABLET | Freq: Four times a day (QID) | ORAL | Status: DC | PRN

## 2018-03-22 MED ORDER — BISACODYL 5 MG PO TBEC: 5.0000 mg | DELAYED_RELEASE_TABLET | Freq: Every day | ORAL | Status: DC | PRN

## 2018-03-22 MED ORDER — VITAMIN D 25 MCG (1000 UNIT) PO TABS
4000.0000 [IU] | ORAL_TABLET | Freq: Every day | ORAL | Status: DC
  Administered 2018-03-23 – 2018-03-28 (×6): 4000 [IU] via ORAL
  Filled 2018-03-22 (×6): qty 4

## 2018-03-22 MED ORDER — SODIUM CHLORIDE 0.9 % IV BOLUS
1300.0000 mL | Freq: Once | INTRAVENOUS | Status: AC
  Administered 2018-03-22: 1300 mL via INTRAVENOUS

## 2018-03-22 MED ORDER — GABAPENTIN 300 MG PO CAPS
900.0000 mg | ORAL_CAPSULE | Freq: Three times a day (TID) | ORAL | Status: DC
  Administered 2018-03-23 – 2018-03-26 (×10): 900 mg via ORAL
  Filled 2018-03-22 (×11): qty 3

## 2018-03-22 MED ORDER — VANCOMYCIN HCL IN DEXTROSE 750-5 MG/150ML-% IV SOLN
750.0000 mg | Freq: Two times a day (BID) | INTRAVENOUS | Status: DC
  Administered 2018-03-23: 750 mg via INTRAVENOUS
  Filled 2018-03-22 (×2): qty 150

## 2018-03-22 MED ORDER — SODIUM CHLORIDE 0.9 % IV BOLUS
1000.0000 mL | Freq: Once | INTRAVENOUS | Status: AC
  Administered 2018-03-22: 1000 mL via INTRAVENOUS

## 2018-03-22 MED ORDER — METRONIDAZOLE IN NACL 5-0.79 MG/ML-% IV SOLN
500.0000 mg | Freq: Three times a day (TID) | INTRAVENOUS | Status: DC
  Administered 2018-03-22 – 2018-03-24 (×6): 500 mg via INTRAVENOUS
  Filled 2018-03-22 (×6): qty 100

## 2018-03-22 MED ORDER — LIDOCAINE VISCOUS HCL 2 % MT SOLN
15.0000 mL | OROMUCOSAL | Status: DC | PRN
  Filled 2018-03-22: qty 15

## 2018-03-22 MED ORDER — VANCOMYCIN HCL IN DEXTROSE 1-5 GM/200ML-% IV SOLN: 1000.0000 mg | Freq: Once | INTRAVENOUS | Status: DC

## 2018-03-22 MED ORDER — ATORVASTATIN CALCIUM 40 MG PO TABS
40.0000 mg | ORAL_TABLET | Freq: Every day | ORAL | Status: DC
  Administered 2018-03-23 – 2018-03-28 (×6): 40 mg via ORAL
  Filled 2018-03-22 (×6): qty 1

## 2018-03-22 MED ORDER — FLUOXETINE HCL 20 MG PO CAPS
20.0000 mg | ORAL_CAPSULE | Freq: Every day | ORAL | Status: DC
  Administered 2018-03-23 – 2018-03-28 (×6): 20 mg via ORAL
  Filled 2018-03-22 (×6): qty 1

## 2018-03-22 MED ORDER — DIPHENHYDRAMINE HCL 25 MG PO CAPS: 50.0000 mg | ORAL_CAPSULE | Freq: Four times a day (QID) | ORAL | Status: DC | PRN

## 2018-03-22 MED ORDER — ENSURE ENLIVE PO LIQD: 237.0000 mL | Freq: Two times a day (BID) | ORAL | Status: DC

## 2018-03-22 MED ORDER — HEPARIN SODIUM (PORCINE) 5000 UNIT/ML IJ SOLN
5000.0000 [IU] | Freq: Three times a day (TID) | INTRAMUSCULAR | Status: DC
  Administered 2018-03-23 – 2018-03-28 (×18): 5000 [IU] via SUBCUTANEOUS
  Filled 2018-03-22 (×18): qty 1

## 2018-03-22 MED ORDER — ENSURE ENLIVE PO LIQD
1.0000 | Freq: Two times a day (BID) | ORAL | Status: DC
  Administered 2018-03-23 – 2018-03-28 (×12): 237 mL via ORAL

## 2018-03-22 MED ORDER — PANTOPRAZOLE SODIUM 40 MG PO TBEC
40.0000 mg | DELAYED_RELEASE_TABLET | Freq: Every day | ORAL | Status: DC
  Administered 2018-03-23 – 2018-03-28 (×6): 40 mg via ORAL
  Filled 2018-03-22 (×6): qty 1

## 2018-03-22 MED ORDER — OXCARBAZEPINE 300 MG PO TABS
300.0000 mg | ORAL_TABLET | Freq: Two times a day (BID) | ORAL | Status: DC
  Administered 2018-03-23 – 2018-03-28 (×11): 300 mg via ORAL
  Filled 2018-03-22 (×13): qty 1

## 2018-03-22 MED ORDER — VANCOMYCIN HCL 10 G IV SOLR
1500.0000 mg | Freq: Once | INTRAVENOUS | Status: AC
  Administered 2018-03-22: 1500 mg via INTRAVENOUS
  Filled 2018-03-22: qty 1500

## 2018-03-22 MED ORDER — ALBUTEROL SULFATE (2.5 MG/3ML) 0.083% IN NEBU: 2.5000 mg | INHALATION_SOLUTION | RESPIRATORY_TRACT | Status: DC | PRN

## 2018-03-22 MED ORDER — BACLOFEN 10 MG PO TABS
10.0000 mg | ORAL_TABLET | Freq: Three times a day (TID) | ORAL | Status: DC
  Administered 2018-03-23 – 2018-03-26 (×10): 10 mg via ORAL
  Filled 2018-03-22 (×11): qty 1

## 2018-03-22 NOTE — ED Triage Notes (Signed)
Patient BIB EMS, per caregiver patient had change in mentation today, baseline is alert but disoriented. Has hx of MS. Caregiver also mentioned that patient had 3 beers today. Has a chronic catheter with possible UTI. Patient aox4 at triage.

## 2018-03-22 NOTE — ED Notes (Signed)
Patient transported to CT 
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
## 2018-03-22 NOTE — Progress Notes (Signed)
Pharmacy Antibiotic Note  Travis Cantrell is a 58 y.o. male admitted on 03/22/2018 with sepsis and unknown source.  Pharmacy has been consulted for cefepime/vancomycin dosing.  Presenting with change in mentation, has chronic catheter. Has hx of MS. Afebrile. WBC 19.7. Scr 1.65 (CrCl 51 mL/min) - above baseline ~0.9-1.   Plan: Cefepime 2g IV every 12 hours Vancomycin 1500 mg IV once Vancomycin 750 mg IV every 12 hours Monitor renal fx, cx results, clinical pic, and levels as appropriate    Temp (24hrs), Avg:98 F (36.7 C), Min:97.7 F (36.5 C), Max:98.3 F (36.8 C)  Recent Labs  Lab 03/22/18 1553  WBC 19.7*    CrCl cannot be calculated (Patient's most recent lab result is older than the maximum 21 days allowed.).    Allergies  Allergen Reactions  . Baclofen Diarrhea    Antimicrobials this admission: Cefepime 2/27 >>  Vancomycin 2/27 >>   Dose adjustments this admission: N/A  Microbiology results: 2/27 BCx: sent  Thank you for allowing pharmacy to be a part of this patient's care.  Sherron Monday, PharmD, BCCCP Clinical Pharmacist  Pager: [phone] Phone: [phone] 03/22/2018 4:47 PM

## 2018-03-22 NOTE — ED Notes (Signed)
Attempted report x1. Left call back number with secretary.  

## 2018-03-23 LAB — URINALYSIS, ROUTINE W REFLEX MICROSCOPIC
BILIRUBIN URINE: NEGATIVE
Glucose, UA: NEGATIVE mg/dL
Ketones, ur: NEGATIVE mg/dL
NITRITE: NEGATIVE
Protein, ur: NEGATIVE mg/dL
RBC / HPF: >50 RBC/hpf — ABNORMAL HIGH (ref 0–5)
Specific Gravity, Urine: 1.013 (ref 1.005–1.030)
WBC, UA: >50 WBC/hpf — ABNORMAL HIGH (ref 0–5)
pH: 6 (ref 5.0–8.0)

## 2018-03-23 LAB — COMPREHENSIVE METABOLIC PANEL
ALT: 12 U/L (ref 0–44)
AST: 12 U/L — ABNORMAL LOW (ref 15–41)
Albumin: 2.5 g/dL — ABNORMAL LOW (ref 3.5–5.0)
Alkaline Phosphatase: 54 U/L (ref 38–126)
Anion gap: 6 (ref 5–15)
BUN: 26 mg/dL — ABNORMAL HIGH (ref 6–20)
CO2: 21 mmol/L — AB (ref 22–32)
Calcium: 8 mg/dL — ABNORMAL LOW (ref 8.9–10.3)
Chloride: 113 mmol/L — ABNORMAL HIGH (ref 98–111)
Creatinine, Ser: 1.03 mg/dL (ref 0.61–1.24)
GFR calc non Af Amer: >60 mL/min (ref >60)
Glucose, Bld: 81 mg/dL (ref 70–99)
Potassium: 4.1 mmol/L (ref 3.5–5.1)
Sodium: 140 mmol/L (ref 135–145)
Total Bilirubin: 1 mg/dL (ref 0.3–1.2)
Total Protein: 5.2 g/dL — ABNORMAL LOW (ref 6.5–8.1)

## 2018-03-23 LAB — CBC
HCT: 41.5 % (ref 39.0–52.0)
Hemoglobin: 12.6 g/dL — ABNORMAL LOW (ref 13.0–17.0)
MCH: 28.3 pg (ref 26.0–34.0)
MCHC: 30.4 g/dL (ref 30.0–36.0)
MCV: 93.3 fL (ref 80.0–100.0)
Platelets: 217 10*3/uL (ref 150–400)
RBC: 4.45 MIL/uL (ref 4.22–5.81)
RDW: 13.2 % (ref 11.5–15.5)
WBC: 9 10*3/uL (ref 4.0–10.5)
nRBC: 0 % (ref 0.0–0.2)

## 2018-03-23 LAB — MRSA PCR SCREENING: MRSA by PCR: NEGATIVE

## 2018-03-23 LAB — HIV ANTIBODY (ROUTINE TESTING W REFLEX): HIV Screen 4th Generation wRfx: NONREACTIVE

## 2018-03-23 MED ORDER — POLYETHYLENE GLYCOL 3350 17 G PO PACK
17.0000 g | PACK | Freq: Every day | ORAL | Status: DC
  Administered 2018-03-23: 17 g via ORAL
  Filled 2018-03-23: qty 1

## 2018-03-23 MED ORDER — VANCOMYCIN HCL IN DEXTROSE 1-5 GM/200ML-% IV SOLN
1000.0000 mg | Freq: Two times a day (BID) | INTRAVENOUS | Status: DC
  Administered 2018-03-23 – 2018-03-24 (×2): 1000 mg via INTRAVENOUS
  Filled 2018-03-23 (×2): qty 200
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
## 2018-03-23 NOTE — Progress Notes (Signed)
Pharmacy Antibiotic Note  Travis Cantrell is a 58 y.o. male admitted on 03/22/2018 with UTI.  Pharmacy has been consulted for Vanco/Cefepime dosing.   ID: Sepsis/unknown source; change in mentation, WBC 19.7>9, afebrile. Scr 1.65>1.03 improved today. LA 2.5  Previous Urine cultures: -2/18 Proteus - 1/18: Pseudomonas - 12/17: Klebsiella and Pseudomonas Cefepime 2/27>> Vancomycin 2/27>> Metronidazole 2/27>>  Vancomycin 1000 mg IV Q 12 hrs. Goal AUC 400-550. Expected AUC: 498.9 SCr used: 1.03  2/27 Bcx: >> 2/28: MRSA: negative  Plan: Cefepime 2g IV every 12 hours. F/u need to increase dose based on cultures with chronic catheter Increase Vanco to 1g IV q 12h     Height: 5\' 10"  (177.8 cm) Weight: 170 lb (77.1 kg) IBW/kg (Calculated) : 73  Temp (24hrs), Avg:98.3 F (36.8 C), Min:97.7 F (36.5 C), Max:98.7 F (37.1 C)  Recent Labs  Lab 03/22/18 1553 03/22/18 1643 03/23/18 0540  WBC 19.7*  --  9.0  CREATININE 1.65*  --  1.03  LATICACIDVEN  --  2.5*  --     Estimated Creatinine Clearance: 81.7 mL/min (by C-G formula based on SCr of 1.03 mg/dL).    Allergies  Allergen Reactions  . Baclofen Diarrhea     Meleah Demeyer S. Merilynn Finland, PharmD, BCPS Clinical Staff Pharmacist Misty Stanley Stillinger 03/23/2018 10:24 AM

## 2018-03-23 NOTE — Progress Notes (Signed)
Patient arrived in the unit at 2050 pm, alert and oriented x3, denied of pain, restitibed, all safety and comfort measures are in placed, will continue to monitor.

## 2018-03-23 NOTE — Progress Notes (Signed)
PROGRESS NOTE    Travis Cantrell  ZNB:[id number] DOB: [birth year] DOA: 03/22/2018 PCP: Excell Seltzer, MD   Chief complaint: Altered mental status  Brief Narrative:  Travis Cantrell is a 58 y.o. male with medical history significant of spastic paraplegia, multiple sclerosis, hypertension, previous decubitus ulcer, alcohol abuse and GERD who presented with altered mental status.  Patient's nurse apparently called EMS complaining of mental status change.  Patient reported that he just drank 3 beers prior to the onset.  No chest pain no shortness of breath or fevers.  His urine was noted to be more turbid.  He has chronic indwelling Foley catheter with recurrent infections.  The last catheter change was 3 weeks earlier.  Patient is drowsy not able to give history straight on.  Denied any other drug intake.  He was noted to have significant UTI on his urinalysis.  Suspected metabolic encephalopathy probably secondary to UTI and patient is being admitted to the hospital for treatment..  ED Course: Temperature is 97.7 blood pressure is 94/58 pulse 79 respirate of 13 oxygen sat 92% room air.  Alcohol level is 78 lactic acid 2.5.  Head CT without contrast showed no acute findings.  Urine drug screen is positive for THC.  Urinalysis showed hazy urine with a large hemoglobin.  RBC 21-50 WBC more than 50 with few bacteria.  White count is 19.7 hemoglobin 13.9.  Albumin 3.2 with creatinine 1.65 previous creatinine was 0.9 in November.'s BUN is 41.  The rest of the chemistry appeared to be relatively normal.  Patient is being admitted with early sepsis most likely secondary to UTI associated with his Foley catheter.  Also acute kidney injury.    Assessment & Plan:   Principal Problem:   Acute metabolic encephalopathy Active Problems:   GERD   Presence of indwelling urinary catheter   Essential hypertension, benign   UTI (urinary tract infection) due to urinary indwelling Foley catheter (HCC)   Neurogenic  bladder   MDD (major depressive disorder), recurrent episode, moderate (HCC)   Spastic quadriparesis (HCC)   Acute metabolic encephalopathy Patient presenting with confusion and altered mental status likely multifactorial including recurrent UTI, active EtOH use/abuse.  CT head notable for mild cerebral atrophy and no acute findings. --Improved with supportive care, IV fluid hydration and antibiotics --Continue to monitor closely  Urinary tract infection Presenting with elevated WBC count of 19.7 lactic acid 2.5.  Complicated by history of chronic indwelling Foley catheter secondary to MS with chronic urinary retention.  Previous history of Proteus, Pseudomonas, Klebsiella in the past. --MRSA PCR: Negative --Check UA with culture --Blood cultures x2: Pending --Foley catheter exchanged on admission --Continue antibiotics with vancomycin, cefepime, Flagyl for now --Possible de-escalation to cefepime and Cipro tomorrow based on previous culture data --Continue supportive care  Acute renal failure Creatinine on admission 1.65.  Improved with IV fluid hydration. --Creatinine down to 1.03 --Continue to monitor renal function daily  Multiple sclerosis Spastic quadriparesis Patient is chronically bedridden at baseline.  Lives at home with caregiver.  Previous history of decubitus ulcers. --Continue aggressive skin care, turning while inpatient --Continue home baclofen 10 mg p.o. 3 times daily, Tegretol 200 mg p.o. 3 times daily, Trileptal 300 mg p.o. twice daily, Valium 5 mg p.o. every 12 hours as needed, gabapentin 900 mg p.o. 3 times daily, tramadol 50 mg every 6 PRN  Essential hypertension: BP well controlled, continue lisinopril 20 mg p.o. daily  GERD: Continue PPI  HLD: Continue statin   DVT  prophylaxis: Heparin Code Status: Full code Family Communication: None Disposition Plan: Inpatient, anticipate discharge home in 1-2 days   Consultants:   None  Procedures:   Foley  catheter, exchange in the ED on 03/22/2018 (has chronic indwelling Foley catheter)  Antimicrobials:   Vancomycin 2/27>>>  Cefepime 2/27>>>  Flagyl 2/27>>>   Subjective: Patient seen and examined at bedside, requesting assistance with his breakfast.  States feels close to his normal baseline.  States that he lives at home with his caregiver Kemper Durie.  Request when he will be able to discharge home.  Reports some mild constipation.  No other complaints at this time.  Denies headache, no visual changes, no chest pain, no palpitations, no abdominal pain, no fever/chills/night sweats.  No acute events overnight per nursing staff.  Objective: Vitals:   03/23/18 0035 03/23/18 0400 03/23/18 0856 03/23/18 1224  BP: 121/79 104/69 115/74 101/64  Pulse: 73 75 73 73  Resp: 20 18 16 18   Temp: 98.4 F (36.9 C) 98.4 F (36.9 C) 98.7 F (37.1 C) 99.3 F (37.4 C)  TempSrc: Oral Oral Oral Oral  SpO2: 100% 100% 99% 100%  Weight:      Height:        Intake/Output Summary (Last 24 hours) at 03/23/2018 1325 Last data filed at 03/23/2018 0500 Gross per 24 hour  Intake 1463.19 ml  Output 1050 ml  Net 413.19 ml   Filed Weights   03/22/18 1604  Weight: 77.1 kg    Examination:  General exam: Appears calm and comfortable  Respiratory system: Clear to auscultation. Respiratory effort normal. Cardiovascular system: S1 & S2 heard, RRR. No JVD, murmurs, rubs, gallops or clicks. No pedal edema. Gastrointestinal system: Abdomen is nondistended, soft and nontender. No organomegaly or masses felt. Normal bowel sounds heard. Central nervous system: Alert and oriented. No focal neurological deficits. Extremities: Spastic quadriparesis with contractures upper/lower extremities Skin: No rashes, lesions or ulcers Psychiatry: Judgement and insight appear normal. Mood & affect appropriate.     Data Reviewed: I have personally reviewed following labs and imaging studies  CBC: Recent Labs  Lab  03/22/18 1553 03/23/18 0540  WBC 19.7* 9.0  NEUTROABS 12.6*  --   HGB 13.9 12.6*  HCT 45.6 41.5  MCV 95.0 93.3  PLT 305 217   Basic Metabolic Panel: Recent Labs  Lab 03/22/18 1553 03/23/18 0540  NA 138 140  K 3.5 4.1  CL 109 113*  CO2 18* 21*  GLUCOSE 78 81  BUN 41* 26*  CREATININE 1.65* 1.03  CALCIUM 8.3* 8.0*   GFR: Estimated Creatinine Clearance: 81.7 mL/min (by C-G formula based on SCr of 1.03 mg/dL). Liver Function Tests: Recent Labs  Lab 03/22/18 1553 03/23/18 0540  AST 13* 12*  ALT 11 12  ALKPHOS 62 54  BILITOT 0.6 1.0  PROT 6.2* 5.2*  ALBUMIN 3.2* 2.5*   No results for input(s): LIPASE, AMYLASE in the last 168 hours. Recent Labs  Lab 03/22/18 1553  AMMONIA 28   Coagulation Profile: No results for input(s): INR, PROTIME in the last 168 hours. Cardiac Enzymes: No results for input(s): CKTOTAL, CKMB, CKMBINDEX, TROPONINI in the last 168 hours. BNP (last 3 results) No results for input(s): PROBNP in the last 8760 hours. HbA1C: No results for input(s): HGBA1C in the last 72 hours. CBG: No results for input(s): GLUCAP in the last 168 hours. Lipid Profile: No results for input(s): CHOL, HDL, LDLCALC, TRIG, CHOLHDL, LDLDIRECT in the last 72 hours. Thyroid Function Tests: No results  for input(s): TSH, T4TOTAL, FREET4, T3FREE, THYROIDAB in the last 72 hours. Anemia Panel: No results for input(s): VITAMINB12, FOLATE, FERRITIN, TIBC, IRON, RETICCTPCT in the last 72 hours. Sepsis Labs: Recent Labs  Lab 03/22/18 1643  LATICACIDVEN 2.5*    Recent Results (from the past 240 hour(s))  Blood Culture (routine x 2)     Status: None (Preliminary result)   Collection Time: 03/22/18  5:28 PM  Result Value Ref Range Status   Specimen Description BLOOD RIGHT FOREARM  Final   Special Requests   Final    BOTTLES DRAWN AEROBIC AND ANAEROBIC Blood Culture adequate volume Performed at [facility], [address] [place], [place] [address]    Culture NO  GROWTH < 24 HOURS  Final   Report Status PENDING  Incomplete  Blood Culture (routine x 2)     Status: None (Preliminary result)   Collection Time: 03/22/18  5:29 PM  Result Value Ref Range Status   Specimen Description BLOOD LEFT FOREARM  Final   Special Requests   Final    BOTTLES DRAWN AEROBIC AND ANAEROBIC Blood Culture results may not be optimal due to an inadequate volume of blood received in culture bottles Performed at [facility], [address] [place], [place] [address]    Culture NO GROWTH < 24 HOURS  Final   Report Status PENDING  Incomplete  MRSA PCR Screening     Status: None   Collection Time: 03/23/18  8:12 AM  Result Value Ref Range Status   MRSA by PCR NEGATIVE NEGATIVE Final    Comment:        The GeneXpert MRSA Assay (FDA approved for NASAL specimens only), is one component of a comprehensive MRSA colonization surveillance program. It is not intended to diagnose MRSA infection nor to guide or monitor treatment for MRSA infections. Performed at [facility], [address] [place], [place] [address]          Radiology Studies: Ct Head Wo Contrast  Result Date: 03/22/2018 CLINICAL DATA:  Altered mental status EXAM: CT HEAD WITHOUT CONTRAST TECHNIQUE: Contiguous axial images were obtained from the base of the skull through the vertex without intravenous contrast. COMPARISON:  12/13/2017 FINDINGS: Brain: Mild cerebral atrophy. No acute intracranial abnormality. Specifically, no hemorrhage, hydrocephalus, mass lesion, acute infarction, or significant intracranial injury. Vascular: No hyperdense vessel or unexpected calcification. Skull: No acute calvarial abnormality. Sinuses/Orbits: No acute finding Other: None IMPRESSION: Field mild cerebral atrophy.  No acute intracranial abnormality. Electronically Signed   By: Charlett Nose M.D.   On: 03/22/2018 19:19        Scheduled Meds: . atorvastatin  40 mg Oral Daily  . baclofen  10 mg Oral TID  .  carbamazepine  200 mg Oral TID  . cholecalciferol  4,000 Units Oral Daily  . feeding supplement (ENSURE ENLIVE)  1 Bottle Oral BID WC  . FLUoxetine  20 mg Oral Daily  . gabapentin  900 mg Oral TID  . heparin  5,000 Units Subcutaneous Q8H  . lisinopril  20 mg Oral Daily  . multivitamin with minerals  1 tablet Oral Daily  . nystatin  1 g Topical QID  . Oxcarbazepine  300 mg Oral BID  . pantoprazole  40 mg Oral Daily  . polyethylene glycol  17 g Oral Daily  . vitamin C  250 mg Oral Daily   Continuous Infusions: . sodium chloride 75 mL/hr at 03/23/18 0016  . ceFEPime (MAXIPIME) IV  2 g (03/23/18 [id number])  . metronidazole 500 mg (03/23/18 0918)  . vancomycin       LOS: 1 day    Time spent: 29 minutes    Alvira PhilipsEric J [place], DO [facility] Pager [phone]  If 7PM-7AM, please contact night-coverage [email or web address] Password [email or web address] 03/23/2018, 1:25 PM
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
## 2018-03-24 LAB — CBC
HCT: 38.4 % — ABNORMAL LOW (ref 39.0–52.0)
Hemoglobin: 11.9 g/dL — ABNORMAL LOW (ref 13.0–17.0)
MCH: 28.3 pg (ref 26.0–34.0)
MCHC: 31 g/dL (ref 30.0–36.0)
MCV: 91.4 fL (ref 80.0–100.0)
Platelets: 223 10*3/uL (ref 150–400)
RBC: 4.2 MIL/uL — ABNORMAL LOW (ref 4.22–5.81)
RDW: 12.9 % (ref 11.5–15.5)
WBC: 10 10*3/uL (ref 4.0–10.5)
nRBC: 0 % (ref 0.0–0.2)

## 2018-03-24 LAB — BASIC METABOLIC PANEL
Anion gap: 6 (ref 5–15)
BUN: 14 mg/dL (ref 6–20)
CO2: 22 mmol/L (ref 22–32)
Calcium: 8 mg/dL — ABNORMAL LOW (ref 8.9–10.3)
Chloride: 108 mmol/L (ref 98–111)
Creatinine, Ser: 0.78 mg/dL (ref 0.61–1.24)
GFR calc Af Amer: >60 mL/min (ref >60)
GFR calc non Af Amer: >60 mL/min (ref >60)
Glucose, Bld: 99 mg/dL (ref 70–99)
Potassium: 4 mmol/L (ref 3.5–5.1)
Sodium: 136 mmol/L (ref 135–145)

## 2018-03-24 LAB — URINE CULTURE: CULTURE: NO GROWTH

## 2018-03-24 MED ORDER — BISACODYL 10 MG RE SUPP
10.0000 mg | Freq: Every day | RECTAL | Status: DC
  Administered 2018-03-24 – 2018-03-28 (×4): 10 mg via RECTAL
  Filled 2018-03-24 (×5): qty 1

## 2018-03-24 MED ORDER — MAGNESIUM CITRATE PO SOLN
1.0000 | Freq: Once | ORAL | Status: AC
  Administered 2018-03-24: 1 via ORAL
  Filled 2018-03-24: qty 296

## 2018-03-24 MED ORDER — POLYETHYLENE GLYCOL 3350 17 G PO PACK
17.0000 g | PACK | Freq: Two times a day (BID) | ORAL | Status: DC
  Administered 2018-03-24 – 2018-03-28 (×8): 17 g via ORAL
  Filled 2018-03-24 (×8): qty 1

## 2018-03-24 MED ORDER — SODIUM CHLORIDE 0.9 % IV BOLUS
500.0000 mL | Freq: Once | INTRAVENOUS | Status: AC
  Administered 2018-03-24: 500 mL via INTRAVENOUS
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
## 2018-03-24 NOTE — Progress Notes (Signed)
Patient's wife and patient requesting privacy restriction; security present to interview and review form and permission to restrict; form placed in record and registration changed patient to privacy restriction.
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
## 2018-03-24 NOTE — Progress Notes (Signed)
CSW met with the patient's wife. She had concerns about her husbands welfare. They are married but are estranged. She had concerns about her husbands care giver. She stated that they would like it to be a family decision as far.   CSW gave current bed offers, family would like.   CSW will continue to follow.   Domenic Schwab, MSW, [place]

## 2018-03-24 NOTE — Progress Notes (Signed)
After speaking with patient daughter and wife per previous MD, pt is not to take Tegretol and to take Trileptal instead. Nurse notified MD about Tegretol and Trileptal meds. Nurse will continue to monitor.

## 2018-03-24 NOTE — Progress Notes (Signed)
PROGRESS NOTE    Travis Cantrell  GQB:[id number] DOB: [birth year] DOA: 03/22/2018 PCP: Excell Seltzer, MD   Chief complaint: Altered mental status  Brief Narrative:  Travis Cantrell is a 58 y.o. male with medical history significant of spastic paraplegia, multiple sclerosis, hypertension, previous decubitus ulcer, alcohol abuse and GERD who presented with altered mental status.  Patient's nurse apparently called EMS complaining of mental status change.  Patient reported that he just drank 3 beers prior to the onset.  No chest pain no shortness of breath or fevers.  His urine was noted to be more turbid.  He has chronic indwelling Foley catheter with recurrent infections.  The last catheter change was 3 weeks earlier.  Patient is drowsy not able to give history straight on.  Denied any other drug intake.  He was noted to have significant UTI on his urinalysis.  Suspected metabolic encephalopathy probably secondary to UTI and patient is being admitted to the hospital for treatment..  ED Course: Temperature is 97.7 blood pressure is 94/58 pulse 79 respirate of 13 oxygen sat 92% room air.  Alcohol level is 78 lactic acid 2.5.  Head CT without contrast showed no acute findings.  Urine drug screen is positive for THC.  Urinalysis showed hazy urine with a large hemoglobin.  RBC 21-50 WBC more than 50 with few bacteria.  White count is 19.7 hemoglobin 13.9.  Albumin 3.2 with creatinine 1.65 previous creatinine was 0.9 in November.'s BUN is 41.  The rest of the chemistry appeared to be relatively normal.  Patient is being admitted with early sepsis most likely secondary to UTI associated with his Foley catheter.  Also acute kidney injury.    Assessment & Plan:   Principal Problem:   Acute metabolic encephalopathy Active Problems:   GERD   Presence of indwelling urinary catheter   Essential hypertension, benign   UTI (urinary tract infection) due to urinary indwelling Foley catheter (HCC)   Neurogenic  bladder   MDD (major depressive disorder), recurrent episode, moderate (HCC)   Spastic quadriparesis (HCC)   Acute metabolic encephalopathy Patient presenting with confusion and altered mental status likely multifactorial including recurrent UTI, active EtOH use/abuse.  CT head notable for mild cerebral atrophy and no acute findings. --Improved with supportive care, IV fluid hydration and antibiotics --Continue to monitor closely  Urinary tract infection Presenting with elevated WBC count of 19.7 lactic acid 2.5.  Complicated by history of chronic indwelling Foley catheter secondary to MS with chronic urinary retention.  Previous history of Proteus, Pseudomonas, Klebsiella in the past. --MRSA PCR: Negative --Urinalysis nitrite/leukoesterase, rare bacteria and greater than 50 WBCs --Urine culture: Pending --Blood cultures x2: No growth x24 hours --Foley catheter exchanged on admission --Discontinue vancomycin secondary to negative MRSA PCR, and DC Flagyl today --Continue antibiotics with cefepime --Continue supportive care  Acute renal failure Creatinine on admission 1.65.  Improved with IV fluid hydration. --Creatinine down to 0.78 --Continue to monitor renal function daily  Multiple sclerosis Spastic quadriparesis Patient is chronically bedridden at baseline.  Lives at home with caregiver.  Previous history of decubitus ulcers. --Continue aggressive skin care, turning while inpatient --Continue home baclofen 10 mg p.o. 3 times daily, Tegretol 200 mg p.o. 3 times daily, Trileptal 300 mg p.o. twice daily, Valium 5 mg p.o. every 12 hours as needed, gabapentin 900 mg p.o. 3 times daily, tramadol 50 mg every 6 PRN  Essential hypertension: BP well controlled, continue lisinopril 20 mg p.o. daily  GERD: Continue PPI  HLD: Continue statin  Constipation No reported bowel movement since hospitalization. --Scheduled MiraLAX twice daily --Bisacodyl 10 mg PR daily --Magnesium citrate x1  today --Continue to monitor strict I's and O's  Ethics: Received notification by overnight nurse that there is concerns about his home living situation.  Will have social work evaluate for possible discharge needs.  PT recommends SNF versus 24-hour care.  He apparently has a home caregiver that lives with him 24 hours.   DVT prophylaxis: Heparin Code Status: Full code Family Communication: None Disposition Plan: Inpatient, anticipate discharge home in 1-2 days   Consultants:   None  Procedures:   Foley catheter, exchange in the ED on 03/22/2018 (has chronic indwelling Foley catheter)  Antimicrobials:   Vancomycin 2/27 - 2/29  Cefepime 2/27>>>  Flagyl 2/27 - 2/29   Subjective: Patient seen and examined at bedside, resting comfortably in bed.  No complaints overnight.  Reported by overnight nursing staff that there may be some issues with his home living environment. No other complaints at this time.  Denies headache, no visual changes, no chest pain, no palpitations, no abdominal pain, no fever/chills/night sweats.  No acute events overnight per nursing staff.  Objective: Vitals:   03/23/18 2150 03/23/18 2329 03/24/18 0612 03/24/18 0807  BP: (!) 151/100 109/74 109/76 103/70  Pulse: 79 70 75 69  Resp: Temp: 98.6 F (37 C) 98.6 F (37 C) 98.2 F (36.8 C) 98.2 F (36.8 C)  TempSrc: Oral Oral Oral Oral  SpO2: 97% 98% 94% 95%  Weight:      Height:        Intake/Output Summary (Last 24 hours) at 03/24/2018 1114 Last data filed at 03/24/2018 0615 Gross per 24 hour  Intake 2802.45 ml  Output 1450 ml  Net 1352.45 ml   Filed Weights   03/22/18 1604  Weight: 77.1 kg    Examination:  General exam: Appears calm and comfortable  Respiratory system: Clear to auscultation. Respiratory effort normal. Cardiovascular system: S1 & S2 heard, RRR. No JVD, murmurs, rubs, gallops or clicks. No pedal edema. Gastrointestinal system: Abdomen is nondistended, soft and  nontender. No organomegaly or masses felt. Normal bowel sounds heard. Central nervous system: Alert and oriented. No focal neurological deficits. Extremities: Spastic quadriparesis with contractures upper/lower extremities Skin: No rashes, lesions or ulcers Psychiatry: Judgement and insight appear normal. Mood & affect appropriate.     Data Reviewed: I have personally reviewed following labs and imaging studies  CBC: Recent Labs  Lab 03/22/18 1553 03/23/18 0540 03/24/18 0609  WBC 19.7* 9.0 10.0  NEUTROABS 12.6*  --   --   HGB 13.9 12.6* 11.9*  HCT 45.6 41.5 38.4*  MCV 95.0 93.3 91.4  PLT 305 217 223   Basic Metabolic Panel: Recent Labs  Lab 03/22/18 1553 03/23/18 0540 03/24/18 0609  NA 138 140 136  K 3.5 4.1 4.0  CL 109 113* 108  CO2 18* 21* 22  GLUCOSE 78 81 99  BUN 41* 26* 14  CREATININE 1.65* 1.03 0.78  CALCIUM 8.3* 8.0* 8.0*   GFR: Estimated Creatinine Clearance: 105.2 mL/min (by C-G formula based on SCr of 0.78 mg/dL). Liver Function Tests: Recent Labs  Lab 03/22/18 1553 03/23/18 0540  AST 13* 12*  ALT 11 12  ALKPHOS 62 54  BILITOT 0.6 1.0  PROT 6.2* 5.2*  ALBUMIN 3.2* 2.5*   No results for input(s): LIPASE, AMYLASE in the last 168 hours. Recent Labs  Lab 03/22/18 1553  AMMONIA 28  Coagulation Profile: No results for input(s): INR, PROTIME in the last 168 hours. Cardiac Enzymes: No results for input(s): CKTOTAL, CKMB, CKMBINDEX, TROPONINI in the last 168 hours. BNP (last 3 results) No results for input(s): PROBNP in the last 8760 hours. HbA1C: No results for input(s): HGBA1C in the last 72 hours. CBG: No results for input(s): GLUCAP in the last 168 hours. Lipid Profile: No results for input(s): CHOL, HDL, LDLCALC, TRIG, CHOLHDL, LDLDIRECT in the last 72 hours. Thyroid Function Tests: No results for input(s): TSH, T4TOTAL, FREET4, T3FREE, THYROIDAB in the last 72 hours. Anemia Panel: No results for input(s): VITAMINB12, FOLATE, FERRITIN,  TIBC, IRON, RETICCTPCT in the last 72 hours. Sepsis Labs: Recent Labs  Lab 03/22/18 1643  LATICACIDVEN 2.5*    Recent Results (from the past 240 hour(s))  Blood Culture (routine x 2)     Status: None (Preliminary result)   Collection Time: 03/22/18  5:28 PM  Result Value Ref Range Status   Specimen Description BLOOD RIGHT FOREARM  Final   Special Requests   Final    BOTTLES DRAWN AEROBIC AND ANAEROBIC Blood Culture adequate volume Performed at [facility], [address], [place], [place] [address]    Culture NO GROWTH < 24 HOURS  Final   Report Status PENDING  Incomplete  Blood Culture (routine x 2)     Status: None (Preliminary result)   Collection Time: 03/22/18  5:29 PM  Result Value Ref Range Status   Specimen Description BLOOD LEFT FOREARM  Final   Special Requests   Final    BOTTLES DRAWN AEROBIC AND ANAEROBIC Blood Culture results may not be optimal due to an inadequate volume of blood received in culture bottles Performed at [facility], [address], [place], [place] [address]    Culture NO GROWTH < 24 HOURS  Final   Report Status PENDING  Incomplete  MRSA PCR Screening     Status: None   Collection Time: 03/23/18  8:12 AM  Result Value Ref Range Status   MRSA by PCR NEGATIVE NEGATIVE Final    Comment:        The GeneXpert MRSA Assay (FDA approved for NASAL specimens only), is one component of a comprehensive MRSA colonization surveillance program. It is not intended to diagnose MRSA infection nor to guide or monitor treatment for MRSA infections. Performed at [facility], [address], [place], [place] [address]          Radiology Studies: Ct Head Wo Contrast  Result Date: 03/22/2018 CLINICAL DATA:  Altered mental status EXAM: CT HEAD WITHOUT CONTRAST TECHNIQUE: Contiguous axial images were obtained from the base of the skull through the vertex without intravenous contrast. COMPARISON:  12/13/2017 FINDINGS: Brain: Mild cerebral  atrophy. No acute intracranial abnormality. Specifically, no hemorrhage, hydrocephalus, mass lesion, acute infarction, or significant intracranial injury. Vascular: No hyperdense vessel or unexpected calcification. Skull: No acute calvarial abnormality. Sinuses/Orbits: No acute finding Other: None IMPRESSION: Field mild cerebral atrophy.  No acute intracranial abnormality. Electronically Signed   By: Charlett Nose M.D.   On: 03/22/2018 19:19        Scheduled Meds: . atorvastatin  40 mg Oral Daily  . baclofen  10 mg Oral TID  . bisacodyl  10 mg Rectal Daily  . carbamazepine  200 mg Oral TID  . cholecalciferol  4,000 Units Oral Daily  . feeding supplement (ENSURE ENLIVE)  1 Bottle Oral BID WC  . FLUoxetine  20 mg Oral  Daily  . gabapentin  900 mg Oral TID  . heparin  5,000 Units Subcutaneous Q8H  . lisinopril  20 mg Oral Daily  . multivitamin with minerals  1 tablet Oral Daily  . nystatin  1 g Topical QID  . Oxcarbazepine  300 mg Oral BID  . pantoprazole  40 mg Oral Daily  . polyethylene glycol  17 g Oral BID  . vitamin C  250 mg Oral Daily   Continuous Infusions: . ceFEPime (MAXIPIME) IV 2 g (03/24/18 0552)     LOS: 2 days    Time spent: 27 minutes    Alvira Philips [place], DO [facility] Pager [phone]  If 7PM-7AM, please contact night-coverage [email or web address] Password [email or web address] 03/24/2018, 11:14 AM

## 2018-03-25 LAB — BASIC METABOLIC PANEL
Anion gap: 5 (ref 5–15)
BUN: 15 mg/dL (ref 6–20)
CO2: 21 mmol/L — ABNORMAL LOW (ref 22–32)
Calcium: 8.1 mg/dL — ABNORMAL LOW (ref 8.9–10.3)
Chloride: 111 mmol/L (ref 98–111)
Creatinine, Ser: 0.84 mg/dL (ref 0.61–1.24)
GFR calc Af Amer: >60 mL/min (ref >60)
GFR calc non Af Amer: >60 mL/min (ref >60)
Glucose, Bld: 94 mg/dL (ref 70–99)
Potassium: 4 mmol/L (ref 3.5–5.1)
Sodium: 137 mmol/L (ref 135–145)

## 2018-03-25 LAB — CBC
HCT: 38.2 % — ABNORMAL LOW (ref 39.0–52.0)
Hemoglobin: 12 g/dL — ABNORMAL LOW (ref 13.0–17.0)
MCH: 28.5 pg (ref 26.0–34.0)
MCHC: 31.4 g/dL (ref 30.0–36.0)
MCV: 90.7 fL (ref 80.0–100.0)
Platelets: 221 10*3/uL (ref 150–400)
RBC: 4.21 MIL/uL — ABNORMAL LOW (ref 4.22–5.81)
RDW: 12.9 % (ref 11.5–15.5)
WBC: 10.3 10*3/uL (ref 4.0–10.5)
nRBC: 0 % (ref 0.0–0.2)

## 2018-03-25 NOTE — Progress Notes (Signed)
PROGRESS NOTE    Travis Cantrell  TDD:[id number] DOB: 1960/08/30 DOA: 03/22/2018 PCP: Excell Seltzer, MD   Chief complaint: Altered mental status  Brief Narrative:  Travis Cantrell is a 58 y.o. male with medical history significant of spastic paraplegia, multiple sclerosis, hypertension, previous decubitus ulcer, alcohol abuse and GERD who presented with altered mental status.  Patient's nurse apparently called EMS complaining of mental status change.  Patient reported that he just drank 3 beers prior to the onset.  No chest pain no shortness of breath or fevers.  His urine was noted to be more turbid.  He has chronic indwelling Foley catheter with recurrent infections.  The last catheter change was 3 weeks earlier.  Patient is drowsy not able to give history straight on.  Denied any other drug intake.  He was noted to have significant UTI on his urinalysis.  Suspected metabolic encephalopathy probably secondary to UTI and patient is being admitted to the hospital for treatment..  ED Course: Temperature is 97.7 blood pressure is 94/58 pulse 79 respirate of 13 oxygen sat 92% room air.  Alcohol level is 78 lactic acid 2.5.  Head CT without contrast showed no acute findings.  Urine drug screen is positive for THC.  Urinalysis showed hazy urine with a large hemoglobin.  RBC 21-50 WBC more than 50 with few bacteria.  White count is 19.7 hemoglobin 13.9.  Albumin 3.2 with creatinine 1.65 previous creatinine was 0.9 in November.'s BUN is 41.  The rest of the chemistry appeared to be relatively normal.  Patient is being admitted with early sepsis most likely secondary to UTI associated with his Foley catheter.  Also acute kidney injury.    Assessment & Plan:   Principal Problem:   Acute metabolic encephalopathy Active Problems:   GERD   Presence of indwelling urinary catheter   Essential hypertension, benign   UTI (urinary tract infection) due to urinary indwelling Foley catheter (HCC)   Neurogenic  bladder   MDD (major depressive disorder), recurrent episode, moderate (HCC)   Spastic quadriparesis (HCC)   Acute metabolic encephalopathy Patient presenting with confusion and altered mental status likely multifactorial including recurrent UTI, active EtOH use/abuse.  CT head notable for mild cerebral atrophy and no acute findings. --Improved with supportive care, IV fluid hydration and antibiotics --Continue to monitor closely --Appears back at his normal baseline  Urinary tract infection Presenting with elevated WBC count of 19.7 lactic acid 2.5.  Complicated by history of chronic indwelling Foley catheter secondary to MS with chronic urinary retention.  Previous history of Proteus, Pseudomonas, Klebsiella in the past. --MRSA PCR: Negative --Urinalysis nitrite/leukoesterase, rare bacteria and greater than 50 WBCs --Urine culture: No growth, although obtained after antibiotic initiation --Blood cultures x2: No growth x 2 days --Foley catheter exchanged on admission --Discontinue vancomycin secondary to negative MRSA PCR, and DC Flagyl 2/29 --Continue antibiotics with cefepime; plan 5-day course --Continue supportive care  Acute renal failure Creatinine on admission 1.65.  Improved with IV fluid hydration. --Creatinine down to 0.84 --Continue to monitor renal function daily  Multiple sclerosis Spastic quadriparesis Patient is chronically bedridden at baseline.  Lives at home with caregiver.  Previous history of decubitus ulcers. --Continue aggressive skin care, turning while inpatient --Continue home baclofen 10 mg p.o. 3 times daily, Trileptal 300 mg p.o. twice daily, Valium 5 mg p.o. every 12 hours as needed, gabapentin 900 mg p.o. 3 times daily, tramadol 50 mg every 6 PRN  Essential hypertension: BP well controlled, continue lisinopril  20 mg p.o. daily  GERD: Continue PPI  HLD: Continue statin  Constipation 2 bowel movements yesterday --Continue MiraLAX twice daily  prn --Bisacodyl 10 mg PR prn --Continue to monitor strict I's and O's    DVT prophylaxis: Heparin Code Status: Full code Family Communication: None Disposition Plan: Inpatient, pending SNF placement   Consultants:   None  Procedures:   Foley catheter, exchange in the ED on 03/22/2018 (has chronic indwelling Foley catheter)  Antimicrobials:   Vancomycin 2/27 - 2/29  Cefepime 2/27>>>  Flagyl 2/27 - 2/29   Subjective: Patient seen and examined at bedside, resting comfortably in bed.  Currently being attended by nursing staff.  Overnight patient was somewhat more somnolent with mild hypotension, resolved with 500 mL fluid bolus.  Family reports that he is not on Tegretol at home; which was recently changed to Trileptal; although was reported as a active home medication on admission.  Tegretol has since been discontinued.  Patient appears back to his normal baseline with hypotension resolved.  Awaiting SNF placement.  No other complaints at this time. Denies headache, no visual changes, no chest pain, no palpitations, no abdominal pain, no fever/chills/night sweats.  No other acute events overnight per nursing staff.  Objective: Vitals:   03/25/18 0530 03/25/18 0532 03/25/18 0702 03/25/18 0810  BP: (!) 87/61 92/64 123/83 139/88  Pulse: (!) 54 (!) 51 61 62  Resp:    17  Temp:  97.7 F (36.5 C)  98.1 F (36.7 C)  TempSrc:  Oral  Oral  SpO2: 100% 100%  100%  Weight:      Height:        Intake/Output Summary (Last 24 hours) at 03/25/2018 1115 Last data filed at 03/25/2018 1100 Gross per 24 hour  Intake 930 ml  Output 1700 ml  Net -770 ml   Filed Weights   03/22/18 1604  Weight: 77.1 kg    Examination:  General exam: Appears calm and comfortable  Respiratory system: Clear to auscultation. Respiratory effort normal. Cardiovascular system: S1 & S2 heard, RRR. No JVD, murmurs, rubs, gallops or clicks. No pedal edema. Gastrointestinal system: Abdomen is nondistended, soft  and nontender. No organomegaly or masses felt. Normal bowel sounds heard. Central nervous system: Alert and oriented. No focal neurological deficits. Extremities: Spastic quadriparesis with contractures upper/lower extremities Skin: No rashes, lesions or ulcers Psychiatry: Judgement and insight appear normal. Mood & affect appropriate.     Data Reviewed: I have personally reviewed following labs and imaging studies  CBC: Recent Labs  Lab 03/22/18 1553 03/23/18 0540 03/24/18 0609 03/25/18 0505  WBC 19.7* 9.0 10.0 10.3  NEUTROABS 12.6*  --   --   --   HGB 13.9 12.6* 11.9* 12.0*  HCT 45.6 41.5 38.4* 38.2*  MCV 95.0 93.3 91.4 90.7  PLT 305 217 223 221   Basic Metabolic Panel: Recent Labs  Lab 03/22/18 1553 03/23/18 0540 03/24/18 0609 03/25/18 0505  NA 138 140 136 137  K 3.5 4.1 4.0 4.0  CL 109 113* 108 111  CO2 18* 21* 22 21*  GLUCOSE 78 81 99 94  BUN 41* 26* 14 15  CREATININE 1.65* 1.03 0.78 0.84  CALCIUM 8.3* 8.0* 8.0* 8.1*   GFR: Estimated Creatinine Clearance: 100.2 mL/min (by C-G formula based on SCr of 0.84 mg/dL). Liver Function Tests: Recent Labs  Lab 03/22/18 1553 03/23/18 0540  AST 13* 12*  ALT 11 12  ALKPHOS 62 54  BILITOT 0.6 1.0  PROT 6.2* 5.2*  ALBUMIN 3.2*  2.5*   No results for input(s): LIPASE, AMYLASE in the last 168 hours. Recent Labs  Lab 03/22/18 1553  AMMONIA 28   Coagulation Profile: No results for input(s): INR, PROTIME in the last 168 hours. Cardiac Enzymes: No results for input(s): CKTOTAL, CKMB, CKMBINDEX, TROPONINI in the last 168 hours. BNP (last 3 results) No results for input(s): PROBNP in the last 8760 hours. HbA1C: No results for input(s): HGBA1C in the last 72 hours. CBG: No results for input(s): GLUCAP in the last 168 hours. Lipid Profile: No results for input(s): CHOL, HDL, LDLCALC, TRIG, CHOLHDL, LDLDIRECT in the last 72 hours. Thyroid Function Tests: No results for input(s): TSH, T4TOTAL, FREET4, T3FREE,  THYROIDAB in the last 72 hours. Anemia Panel: No results for input(s): VITAMINB12, FOLATE, FERRITIN, TIBC, IRON, RETICCTPCT in the last 72 hours. Sepsis Labs: Recent Labs  Lab 03/22/18 1643  LATICACIDVEN 2.5*    Recent Results (from the past 240 hour(s))  Blood Culture (routine x 2)     Status: None (Preliminary result)   Collection Time: 03/22/18  5:28 PM  Result Value Ref Range Status   Specimen Description BLOOD RIGHT FOREARM  Final   Special Requests   Final    BOTTLES DRAWN AEROBIC AND ANAEROBIC Blood Culture adequate volume   Culture   Final    NO GROWTH 2 DAYS Performed at [facility], [address], [place], [place] [address]    Report Status PENDING  Incomplete  Blood Culture (routine x 2)     Status: None (Preliminary result)   Collection Time: 03/22/18  5:29 PM  Result Value Ref Range Status   Specimen Description BLOOD LEFT FOREARM  Final   Special Requests   Final    BOTTLES DRAWN AEROBIC AND ANAEROBIC Blood Culture results may not be optimal due to an inadequate volume of blood received in culture bottles   Culture   Final    NO GROWTH 2 DAYS Performed at [facility], [address], [place], [place] [address]    Report Status PENDING  Incomplete  MRSA PCR Screening     Status: None   Collection Time: 03/23/18  8:12 AM  Result Value Ref Range Status   MRSA by PCR NEGATIVE NEGATIVE Final    Comment:        The GeneXpert MRSA Assay (FDA approved for NASAL specimens only), is one component of a comprehensive MRSA colonization surveillance program. It is not intended to diagnose MRSA infection nor to guide or monitor treatment for MRSA infections. Performed at [facility], [address], [place], [place] [address]   Urine culture     Status: None   Collection Time: 03/23/18  2:29 PM  Result Value Ref Range Status   Specimen Description URINE, CATHETERIZED  Final   Special Requests NONE  Final   Culture   Final    NO  GROWTH Performed at [facility], [address], [place], [place] [address]    Report Status 03/24/2018 FINAL  Final         Radiology Studies: No results found.      Scheduled Meds: . atorvastatin  40 mg Oral Daily  . baclofen  10 mg Oral TID  . bisacodyl  10 mg Rectal Daily  . cholecalciferol  4,000 Units Oral Daily  . feeding supplement (ENSURE ENLIVE)  1 Bottle Oral BID WC  . FLUoxetine  20 mg Oral Daily  . gabapentin  900 mg Oral  TID  . heparin  5,000 Units Subcutaneous Q8H  . lisinopril  20 mg Oral Daily  . multivitamin with minerals  1 tablet Oral Daily  . nystatin  1 g Topical QID  . Oxcarbazepine  300 mg Oral BID  . pantoprazole  40 mg Oral Daily  . polyethylene glycol  17 g Oral BID  . vitamin C  250 mg Oral Daily   Continuous Infusions: . ceFEPime (MAXIPIME) IV 2 g (03/25/18 0550)     LOS: 3 days    Time spent: 29 minutes    Alvira Philips [place], DO [facility] Pager [phone]  If 7PM-7AM, please contact night-coverage [email or web address] Password [email or web address] 03/25/2018, 11:15 AM

## 2018-03-25 NOTE — Progress Notes (Signed)
Overnight, pt was lethargic, somnolent, opened eyes to voice. BP soft, on call notified. 500 mL NS bolus given. All sedation causing meds were held overnight. Pt now alert, oriented.

## 2018-03-26 ENCOUNTER — Inpatient Hospital Stay (HOSPITAL_COMMUNITY): Payer: Medicare Other

## 2018-03-26 LAB — BASIC METABOLIC PANEL
Anion gap: 4 — ABNORMAL LOW (ref 5–15)
BUN: 20 mg/dL (ref 6–20)
CHLORIDE: 112 mmol/L — AB (ref 98–111)
CO2: 25 mmol/L (ref 22–32)
Calcium: 8.2 mg/dL — ABNORMAL LOW (ref 8.9–10.3)
Creatinine, Ser: 0.9 mg/dL (ref 0.61–1.24)
GFR calc Af Amer: >60 mL/min (ref >60)
GFR calc non Af Amer: >60 mL/min (ref >60)
Glucose, Bld: 109 mg/dL — ABNORMAL HIGH (ref 70–99)
Potassium: 4.1 mmol/L (ref 3.5–5.1)
Sodium: 141 mmol/L (ref 135–145)

## 2018-03-26 LAB — CBC
HCT: 39.1 % (ref 39.0–52.0)
Hemoglobin: 12.4 g/dL — ABNORMAL LOW (ref 13.0–17.0)
MCH: 28.9 pg (ref 26.0–34.0)
MCHC: 31.7 g/dL (ref 30.0–36.0)
MCV: 91.1 fL (ref 80.0–100.0)
Platelets: 263 10*3/uL (ref 150–400)
RBC: 4.29 MIL/uL (ref 4.22–5.81)
RDW: 13.2 % (ref 11.5–15.5)
WBC: 8.5 10*3/uL (ref 4.0–10.5)
nRBC: 0 % (ref 0.0–0.2)

## 2018-03-26 LAB — LACTIC ACID, PLASMA
Lactic Acid, Venous: 0.8 mmol/L (ref 0.5–1.9)
Lactic Acid, Venous: 0.8 mmol/L (ref 0.5–1.9)

## 2018-03-26 LAB — GLUCOSE, CAPILLARY: Glucose-Capillary: 116 mg/dL — ABNORMAL HIGH (ref 70–99)

## 2018-03-26 LAB — CORTISOL: Cortisol, Plasma: 7.6 ug/dL

## 2018-03-26 MED ORDER — SODIUM CHLORIDE 0.9 % IV BOLUS
500.0000 mL | Freq: Once | INTRAVENOUS | Status: AC
  Administered 2018-03-26: 500 mL via INTRAVENOUS

## 2018-03-26 MED ORDER — BACLOFEN 10 MG PO TABS
5.0000 mg | ORAL_TABLET | Freq: Three times a day (TID) | ORAL | Status: DC
  Administered 2018-03-26 – 2018-03-28 (×5): 5 mg via ORAL
  Filled 2018-03-26 (×5): qty 1

## 2018-03-26 MED ORDER — TRAMADOL HCL 50 MG PO TABS: 25.0000 mg | ORAL_TABLET | Freq: Four times a day (QID) | ORAL | Status: DC | PRN

## 2018-03-26 MED ORDER — GADOBUTROL 1 MMOL/ML IV SOLN
7.5000 mL | Freq: Once | INTRAVENOUS | Status: AC | PRN
  Administered 2018-03-26: 7.5 mL via INTRAVENOUS

## 2018-03-26 MED ORDER — SODIUM CHLORIDE 0.9 % IV SOLN
INTRAVENOUS | Status: DC
  Administered 2018-03-26 – 2018-03-27 (×3): via INTRAVENOUS

## 2018-03-26 NOTE — Consult Note (Signed)
            [facility] CM Primary Care Navigator  03/26/2018  Travis Cantrell Dec 18, 1960 [id number]   Went to see patient at the bedside to identify possible discharge needs but he was fast asleep and no family noted at the bedside.  Per MD note, patient presented with altered mental status, admitted to the hospital for suspected metabolic encephalopathy probably secondary to UTI due to indwelling foley catheter; acute renal failure, multiple sclerosis with spastic quadriparesis- chronically bedridden)  Per Inpatient social worker note, patient's wife (estranged) would like for patient to go to SNF-skilled nursing facility for 24 hour care since the girl that helps patient is currently unable to care for him at their home given patient's current physical needs and fall risks. Patient's daughter, Travis Cantrell, assists with patient's medical appointments and patient's sister has financial POA (Travis Cantrell).  Daughter had also expressed concern about patient's care and is interested in having him go to SNF-skilled nursing facility for long term care after rehab. Inpatient social worker recommended she assist patient with applying for Medicaid as a potential long term care payor source. Daughter voiced understanding and requested skilled nursing facility in [place].  No noted [facility] care management needs identifiable at this point.    For additional questions please contact:  Karin Golden A. Chelisa Hennen, BSN, RN-BC [facility] PRIMARY CARE Navigator Cell: [phone]

## 2018-03-26 NOTE — Progress Notes (Signed)
CM following for progression of care; DCP - SNF placement at discharge; [name] Goodell [phone]

## 2018-03-26 NOTE — Progress Notes (Signed)
Pt stated to RN that he does not want his wife Delbert Harness) to make any medical decisions for him, stated he only wants his sister Santo Held) to make medical decisions. Pt is A/Ox4 at this time, placed order for social work and chaplain consult to creat advanced directives. Jorge Ny

## 2018-03-26 NOTE — Progress Notes (Signed)
Patient, when oriented, requests that all decisions be made through his sister, Travis Cantrell. CSW spoke with RN to request spiritual care to come and assist patient with completing his advanced directive now that his confusion from the weekend appears to have passed. CSW spoke with Travis Cantrell over the phone to discuss bed offers.   Per Travis Cantrell, patient's wife left him 12 years ago, and they have not been in communication. Patient's daughters and sisters have been assisting him with appointments and helping to take care of him, the wife has not been involved. Per Travis Cantrell, she told the patient when she came to see him over the weekend that if he didn't want his wife involved, that he needed to tell the medical team to take her off of his contacts. CSW discussed how we are working to assist him with completing his healthcare power of attorney paperwork to indicate who he would choose to make decisions for him.   CSW provided Travis Cantrell with a list of facility choices for her to review for SNF. Travis Cantrell to review and get back to CSW with choice.  CSW to follow.  Blenda Nicely, [place] Clinical Social Worker [phone]

## 2018-03-26 NOTE — Progress Notes (Signed)
MD notified of symptomatic hypotension, MEWS score 2, orders received . Jorge Ny

## 2018-03-26 NOTE — Progress Notes (Addendum)
PROGRESS NOTE    Travis Cantrell  GYF:[id number] DOB: 1961-01-17 DOA: 03/22/2018 PCP: Excell Seltzer, MD    Brief Narrative: 58 year old with past medical history significant for his spastic paraplegia, multiple sclerosis, hypertension, previous decubitus ulcer, alcohol abuse and GERD who presented with altered mental status.  Patient's nurse apparently called EMS complaining of mental status change.  Patient reported that he drank 3 beers prior to the onset of changes.  His urine was noted to be turbid.  Patient has a chronic indwelling Foley catheter.  Patient was drowsy on admission.  She was admitted with acute metabolic encephalopathy probably related to UTI. Patient today is complaining that he has not been able to move his left arm, per report patient prior to admission was able to move his left arm to feed himself.  Assessment & Plan:   Principal Problem:   Acute metabolic encephalopathy Active Problems:   GERD   Presence of indwelling urinary catheter   Essential hypertension, benign   UTI (urinary tract infection) due to urinary indwelling Foley catheter (HCC)   Neurogenic bladder   MDD (major depressive disorder), recurrent episode, moderate (HCC)   Spastic quadriparesis (HCC)   1-Acute metabolic encephalopathy: Patient present with confusion and altered mental status, likely multifactorial UTI and active alcohol use.  CT head was negative for acute finding. Patient was treated with IV fluids and IV antibiotics  2-urinary tract infection secondary to chronic indwelling Foley catheter.  With chronic Foley catheter secondary to MS and chronic urinary retention. Present with a white count at 19, lactic acid at 2.5. UA with more than 50 white blood cell. Blood cultures no growth today. Foley catheter was exchanged on admission. Continue with cefepime  Hypotension;  Could be related to hypovolemia ,  medications.  Hold sedatives.  IV bolus. Time 2, schedule  fluids Called by 5;17 nurse that Repeated BP 76/50, patient hard to wake up. I came to evaluate patient, rapid respond was at bedside. Patient wake up, repeated BP 118-60. He was alert and oriented, report dizziness. He will received another bolus. Schedule IV fluids. Lactic acid normal. Per patient , he was not taking gabapentin. Stop gabapentin. Decrease baclofen, hold for sedation. Check cortisol level. Upgrade patient to step down unit.   Acute renal failure: Creatinine on admission 1.6.  Improved with IV fluids.  Multiple sclerosis, spastic quadriparesis Patient is chronically bedridden at baseline.  Lives at home with caregiver.  Previous history of decubitus ulcers. Continue with supportive care. Continue with baclofen, Trileptal, Valium, gabapentin, tramadol. He is complaining of worsening left side weakness.  Will get MRI of the brain and cervical spine.  HTN; he has some episode of hypotension during this admission.  I will hold lisinopril at this time.  GERD: Continue with PPI  HLD: Continue with the statins  Constipation: He had 2 bowel movement on 2/29.  A stage II decubitus right , left sacrum; Local care    Pressure Injury 03/13/17 Stage II -  Partial thickness loss of dermis presenting as a shallow open ulcer with a red, pink wound bed without slough. (Active)  03/13/17 1833  Location: Sacrum  Location Orientation: Right;Left  Staging: Stage II -  Partial thickness loss of dermis presenting as a shallow open ulcer with a red, pink wound bed without slough.  Wound Description (Comments):   Present on Admission: Yes     Estimated body mass index is 24.39 kg/m as calculated from the following:   Height as of this  encounter: 5\' 10"  (1.778 m).   Weight as of this encounter: 77.1 kg.   DVT prophylaxis: Heparin Code Status: Full code Family Communication: Family at bedside at time of my evaluation Disposition Plan: SNF, MRI of brain and cervical spine ordered to  further evaluate MS  Consultants:   None   Procedures:   None   Antimicrobials:  Cefepime  Subjective: He is alert, he repeats himself over and over.  He is concerned with left side arm weakness.   Objective: Vitals:   03/25/18 1647 03/25/18 2006 03/26/18 0025 03/26/18 0816  BP: (!) 93/59 109/71 90/64 102/66  Pulse: 79 (!) 103 76 68  Resp:  20 18 18   Temp: 98.3 F (36.8 C) 99.3 F (37.4 C) 98.9 F (37.2 C) 98.2 F (36.8 C)  TempSrc: Oral Oral Oral Oral  SpO2: 100% 96% 97% 98%  Weight:      Height:        Intake/Output Summary (Last 24 hours) at 03/26/2018 1132 Last data filed at 03/26/2018 1000 Gross per 24 hour  Intake 240 ml  Output 1650 ml  Net -1410 ml   Filed Weights   03/22/18 1604  Weight: 77.1 kg    Examination:  General exam: Appears calm and comfortable  Respiratory system: Clear to auscultation. Respiratory effort normal. Cardiovascular system: S1 & S2 heard, RRR. No JVD, murmurs, rubs, gallops or clicks. No pedal edema. Gastrointestinal system: Abdomen is nondistended, soft and nontender. No organomegaly or masses felt. Normal bowel sounds heard. Central nervous system: Alert and oriented.  Spastic quadriparesis, unable to move left arm Extremities: Symmetric 5 x 5 power. Skin: No rashes, lesions or ulcers    Data Reviewed: I have personally reviewed following labs and imaging studies  CBC: Recent Labs  Lab 03/22/18 1553 03/23/18 0540 03/24/18 0609 03/25/18 0505 03/26/18 0518  WBC 19.7* 9.0 10.0 10.3 8.5  NEUTROABS 12.6*  --   --   --   --   HGB 13.9 12.6* 11.9* 12.0* 12.4*  HCT 45.6 41.5 38.4* 38.2* 39.1  MCV 95.0 93.3 91.4 90.7 91.1  PLT 305 217 223 221 263   Basic Metabolic Panel: Recent Labs  Lab 03/22/18 1553 03/23/18 0540 03/24/18 0609 03/25/18 0505 03/26/18 0518  NA 138 140 136 137 141  K 3.5 4.1 4.0 4.0 4.1  CL 109 113* 108 111 112*  CO2 18* 21* 22 21* 25  GLUCOSE 78 81 99 94 109*  BUN 41* 26* 14 15 20    CREATININE 1.65* 1.03 0.78 0.84 0.90  CALCIUM 8.3* 8.0* 8.0* 8.1* 8.2*   GFR: Estimated Creatinine Clearance: 93.5 mL/min (by C-G formula based on SCr of 0.9 mg/dL). Liver Function Tests: Recent Labs  Lab 03/22/18 1553 03/23/18 0540  AST 13* 12*  ALT 11 12  ALKPHOS 62 54  BILITOT 0.6 1.0  PROT 6.2* 5.2*  ALBUMIN 3.2* 2.5*   No results for input(s): LIPASE, AMYLASE in the last 168 hours. Recent Labs  Lab 03/22/18 1553  AMMONIA 28   Coagulation Profile: No results for input(s): INR, PROTIME in the last 168 hours. Cardiac Enzymes: No results for input(s): CKTOTAL, CKMB, CKMBINDEX, TROPONINI in the last 168 hours. BNP (last 3 results) No results for input(s): PROBNP in the last 8760 hours. HbA1C: No results for input(s): HGBA1C in the last 72 hours. CBG: No results for input(s): GLUCAP in the last 168 hours. Lipid Profile: No results for input(s): CHOL, HDL, LDLCALC, TRIG, CHOLHDL, LDLDIRECT in the last 72 hours. Thyroid Function  Tests: No results for input(s): TSH, T4TOTAL, FREET4, T3FREE, THYROIDAB in the last 72 hours. Anemia Panel: No results for input(s): VITAMINB12, FOLATE, FERRITIN, TIBC, IRON, RETICCTPCT in the last 72 hours. Sepsis Labs: Recent Labs  Lab 03/22/18 1643  LATICACIDVEN 2.5*    Recent Results (from the past 240 hour(s))  Blood Culture (routine x 2)     Status: None (Preliminary result)   Collection Time: 03/22/18  5:28 PM  Result Value Ref Range Status   Specimen Description BLOOD RIGHT FOREARM  Final   Special Requests   Final    BOTTLES DRAWN AEROBIC AND ANAEROBIC Blood Culture adequate volume Performed at [facility], [address], [place], [place] [address]    Culture NO GROWTH 4 DAYS  Final   Report Status PENDING  Incomplete  Blood Culture (routine x 2)     Status: None (Preliminary result)   Collection Time: 03/22/18  5:29 PM  Result Value Ref Range Status   Specimen Description BLOOD LEFT FOREARM  Final   Special  Requests   Final    BOTTLES DRAWN AEROBIC AND ANAEROBIC Blood Culture results may not be optimal due to an inadequate volume of blood received in culture bottles Performed at [facility], [address] [place], [place] [address]    Culture NO GROWTH 4 DAYS  Final   Report Status PENDING  Incomplete  MRSA PCR Screening     Status: None   Collection Time: 03/23/18  8:12 AM  Result Value Ref Range Status   MRSA by PCR NEGATIVE NEGATIVE Final    Comment:        The GeneXpert MRSA Assay (FDA approved for NASAL specimens only), is one component of a comprehensive MRSA colonization surveillance program. It is not intended to diagnose MRSA infection nor to guide or monitor treatment for MRSA infections. Performed at [facility], [address] [place], [place] [address]   Urine culture     Status: None   Collection Time: 03/23/18  2:29 PM  Result Value Ref Range Status   Specimen Description URINE, CATHETERIZED  Final   Special Requests NONE  Final   Culture   Final    NO GROWTH Performed at [facility], [address] [place], [place] [address]    Report Status 03/24/2018 FINAL  Final         Radiology Studies: No results found.      Scheduled Meds: . atorvastatin  40 mg Oral Daily  . baclofen  10 mg Oral TID  . bisacodyl  10 mg Rectal Daily  . cholecalciferol  4,000 Units Oral Daily  . feeding supplement (ENSURE ENLIVE)  1 Bottle Oral BID WC  . FLUoxetine  20 mg Oral Daily  . gabapentin  900 mg Oral TID  . heparin  5,000 Units Subcutaneous Q8H  . multivitamin with minerals  1 tablet Oral Daily  . nystatin  1 g Topical QID  . Oxcarbazepine  300 mg Oral BID  . pantoprazole  40 mg Oral Daily  . polyethylene glycol  17 g Oral BID  . vitamin C  250 mg Oral Daily   Continuous Infusions: . ceFEPime (MAXIPIME) IV 2 g (03/26/18 0545)     LOS: 4 days    Time spent: 35 minutes.     Alba Cory, MD [facility]  Password  [facility] 03/26/2018, 11:32 AM

## 2018-03-26 NOTE — Progress Notes (Signed)
Patient does not want his information given to his wife; her contact has been removed from record; she has been told over the phone; she can not get his information per patient request.

## 2018-03-27 LAB — CULTURE, BLOOD (ROUTINE X 2)
Culture: NO GROWTH
Culture: NO GROWTH
Special Requests: ADEQUATE

## 2018-03-27 LAB — CORTISOL-AM, BLOOD: Cortisol - AM: 16.5 ug/dL (ref 6.7–22.6)

## 2018-03-27 NOTE — Progress Notes (Signed)
Chaplain responded to page from nurse. Patient with staff who said "Patient Care" when door opened.  Will be returning at another time, and arrange notary if possible in the meantime.  Lynnell Chad Pager [phone]

## 2018-03-27 NOTE — Progress Notes (Signed)
Physical Therapy Treatment Patient Details Name: Travis Cantrell MRN: [id number] DOB: [birth year] Today's Date: 03/27/2018    History of Present Illness Patient is a 58 y/o male presenting to the ED on 03/22/2018 with AMS. Past medical history significant of spastic paraplegia, multiple sclerosis, hypertension, previous decubitus ulcer, alcohol abuse and GERD. Admitted for suspected metabolic encephalopathy probably secondary to UTI.     PT Comments    Patient reports since getting medicine in hospital, he has been unable to use LUE functionally. Tolerated rolling to right/left x2 to place maxi move pad. Unfortunately all 3 of the maxi move batteries were dead so not able to transfer to the chair. Performed PROM/AAROM with all extremities. Noted to have spasticity in LUE and supposed to be wearing a brace which he has at home. Encouraged OOB to chair via lift tomorrow as tolerated. Will follow.    Follow Up Recommendations  SNF;Supervision/Assistance - 24 hour     Equipment Recommendations  None recommended by PT    Recommendations for Other Services       Precautions / Restrictions Precautions Precautions: Fall Precaution Comments: RUE contracted Restrictions Weight Bearing Restrictions: No    Mobility  Bed Mobility Overal bed mobility: Needs Assistance Bed Mobility: Rolling Rolling: Max assist;Total assist;+2 for physical assistance         General bed mobility comments: Max/total assist for rolling R<>L x2 to place/remove maxi move pad. Maxi move batteries all dead so not able to perform transfer to chair today,  Transfers                 General transfer comment: deferred due to batteries all dead  Ambulation/Gait                 Stairs             Wheelchair Mobility    Modified Rankin (Stroke Patients Only)       Balance                                            Cognition Arousal/Alertness: Awake/alert Behavior  During Therapy: WFL for tasks assessed/performed Overall Cognitive Status: No family/caregiver present to determine baseline cognitive functioning                                 General Comments: PLeasant, follows 1-2 step commands consistently. Slow to respond at times.       Exercises Low Level/ICU Exercises Hip ABduction/ADduction: PROM;Both;5 reps;Supine Heel Slides: PROM;Both;5 reps;Supine Elbow Flexion: AAROM;PROM;Both;10 reps;Supine    General Comments        Pertinent Vitals/Pain Pain Assessment: Faces Faces Pain Scale: Hurts little more Pain Location: movement of all extremities Pain Descriptors / Indicators: Sore;Tightness Pain Intervention(s): Monitored during session;Repositioned    Home Living                      Prior Function            PT Goals (current goals can now be found in the care plan section) Progress towards PT goals: Not progressing toward goals - comment(secondary to inability to use LUE)    Frequency    Min 2X/week      PT Plan Current plan remains appropriate    Co-evaluation  AM-PAC PT "6 Clicks" Mobility   Outcome Measure  Help needed turning from your back to your side while in a flat bed without using bedrails?: Total Help needed moving from lying on your back to sitting on the side of a flat bed without using bedrails?: Total Help needed moving to and from a bed to a chair (including a wheelchair)?: Total Help needed standing up from a chair using your arms (e.g., wheelchair or bedside chair)?: Total Help needed to walk in hospital room?: Total Help needed climbing 3-5 steps with a railing? : Total 6 Click Score: 6    End of Session   Activity Tolerance: Patient tolerated treatment well Patient left: in bed;with call bell/phone within reach;with bed alarm set Nurse Communication: Mobility status;Need for lift equipment PT Visit Diagnosis: Muscle weakness (generalized) (M62.81)      Time: 1330-1400 PT Time Calculation (min) (ACUTE ONLY): 30 min  Charges:  $Therapeutic Exercise: 8-22 mins $Therapeutic Activity: 8-22 mins                     [name] Red, [place], DPT Acute Rehabilitation Services Pager [phone] Office [phone]       Blake Divine A Lanier Ensign 03/27/2018, 2:30 PM

## 2018-03-27 NOTE — Progress Notes (Signed)
PROGRESS NOTE    Travis Cantrell  TDV:[id number] DOB: April 20, 1960 DOA: 03/22/2018 PCP: Excell Seltzer, MD    Brief Narrative: 58 year old with past medical history significant for his spastic paraplegia, multiple sclerosis, hypertension, previous decubitus ulcer, alcohol abuse and GERD who presented with altered mental status.  Patient's nurse apparently called EMS complaining of mental status change.  Patient reported that he drank 3 beers prior to the onset of changes.  His urine was noted to be turbid.  Patient has a chronic indwelling Foley catheter.  Patient was drowsy on admission.  She was admitted with acute metabolic encephalopathy probably related to UTI. Patient today is complaining that he has not been able to move his left arm, per report patient prior to admission was able to move his left arm to feed himself.  Assessment & Plan:   Principal Problem:   Acute metabolic encephalopathy Active Problems:   GERD   Presence of indwelling urinary catheter   Essential hypertension, benign   UTI (urinary tract infection) due to urinary indwelling Foley catheter (HCC)   Neurogenic bladder   MDD (major depressive disorder), recurrent episode, moderate (HCC)   Spastic quadriparesis (HCC)   1-Acute metabolic encephalopathy: Patient present with confusion and altered mental status, likely multifactorial UTI and active alcohol use.  CT head was negative for acute finding. Patient was treated with IV fluids and IV antibiotics Improving.   2-urinary tract infection secondary to chronic indwelling Foley catheter.  With chronic Foley catheter secondary to MS and chronic urinary retention. Present with a white count at 19, lactic acid at 2.5. UA with more than 50 white blood cell. Blood cultures no growth today. Foley catheter was exchanged on admission. Continue with cefepime  Hypotension;  Could be related to hypovolemia ,  medications.  Improved after IV bolus Reduce baclofen  dose. Stop gabapentin, patient relates he was not taking gabapentin.  Lactic acid normal, cortisol level normal.   Acute renal failure: Creatinine on admission 1.6.  Improved with IV fluids.  Multiple sclerosis, spastic quadriparesis Patient is chronically bedridden at baseline.  Lives at home with caregiver.  Previous history of decubitus ulcers. Continue with supportive care. Continue with baclofen, Trileptal, Valium, gabapentin, tramadol. He is complaining of worsening left side weakness.  MRI brai and cervical spine stable MS plaque. Discussed Imaging with neurology who think weakness can be related to pseudo MS flare from UTI. Treat UTI and if no improvement in few days consult neurology. Also I discussed cervical MRI with neurosurgery who doesn't think weakness is related to spinal stenosis.   HTN; he has some episode of hypotension during this admission. Continue to hold lisinopril.   GERD: Continue with PPI  HLD: Continue with the statins  Constipation: He had 2 bowel movement on 2/29.  A stage II decubitus right , left sacrum; Local care        Estimated body mass index is 24.39 kg/m as calculated from the following:   Height as of this encounter: 5\' 10"  (1.778 m).   Weight as of this encounter: 77.1 kg.   DVT prophylaxis: Heparin Code Status: Full code Family Communication: Family at bedside at time of my evaluation Disposition Plan: SNF, MRI of brain and cervical spine ordered to further evaluate MS  Consultants:   None   Procedures:   None   Antimicrobials:  Cefepime  Subjective: He is more alert, he is worry that he is not able to move left arm.   Objective: Vitals:  03/27/18 0417 03/27/18 0600 03/27/18 0816 03/27/18 1215  BP: 109/68  (!) 142/76 109/69  Pulse: 66  64 62  Resp: 18  18 16   Temp: 98.4 F (36.9 C)  98.7 F (37.1 C) 97.6 F (36.4 C)  TempSrc: Axillary  Oral Axillary  SpO2:  96% 96% 98%  Weight:      Height:         Intake/Output Summary (Last 24 hours) at 03/27/2018 1519 Last data filed at 03/27/2018 1335 Gross per 24 hour  Intake 2439.93 ml  Output 1950 ml  Net 489.93 ml   Filed Weights   03/22/18 1604  Weight: 77.1 kg    Examination:  General exam: NAD Respiratory system: CTA Cardiovascular system: S 1, S 2 RRR Gastrointestinal system: BS present, soft, nt Central nervous system: Alert and oriented, spatic quadriparesis.  Extremities: no edema     Data Reviewed: I have personally reviewed following labs and imaging studies  CBC: Recent Labs  Lab 03/22/18 1553 03/23/18 0540 03/24/18 0609 03/25/18 0505 03/26/18 0518  WBC 19.7* 9.0 10.0 10.3 8.5  NEUTROABS 12.6*  --   --   --   --   HGB 13.9 12.6* 11.9* 12.0* 12.4*  HCT 45.6 41.5 38.4* 38.2* 39.1  MCV 95.0 93.3 91.4 90.7 91.1  PLT 305 217 223 221 263   Basic Metabolic Panel: Recent Labs  Lab 03/22/18 1553 03/23/18 0540 03/24/18 0609 03/25/18 0505 03/26/18 0518  NA 138 140 136 137 141  K 3.5 4.1 4.0 4.0 4.1  CL 109 113* 108 111 112*  CO2 18* 21* 22 21* 25  GLUCOSE 78 81 99 94 109*  BUN 41* 26* 14 15 20   CREATININE 1.65* 1.03 0.78 0.84 0.90  CALCIUM 8.3* 8.0* 8.0* 8.1* 8.2*   GFR: Estimated Creatinine Clearance: 93.5 mL/min (by C-G formula based on SCr of 0.9 mg/dL). Liver Function Tests: Recent Labs  Lab 03/22/18 1553 03/23/18 0540  AST 13* 12*  ALT 11 12  ALKPHOS 62 54  BILITOT 0.6 1.0  PROT 6.2* 5.2*  ALBUMIN 3.2* 2.5*   No results for input(s): LIPASE, AMYLASE in the last 168 hours. Recent Labs  Lab 03/22/18 1553  AMMONIA 28   Coagulation Profile: No results for input(s): INR, PROTIME in the last 168 hours. Cardiac Enzymes: No results for input(s): CKTOTAL, CKMB, CKMBINDEX, TROPONINI in the last 168 hours. BNP (last 3 results) No results for input(s): PROBNP in the last 8760 hours. HbA1C: No results for input(s): HGBA1C in the last 72 hours. CBG: Recent Labs  Lab 03/26/18 1601  GLUCAP  116*   Lipid Profile: No results for input(s): CHOL, HDL, LDLCALC, TRIG, CHOLHDL, LDLDIRECT in the last 72 hours. Thyroid Function Tests: No results for input(s): TSH, T4TOTAL, FREET4, T3FREE, THYROIDAB in the last 72 hours. Anemia Panel: No results for input(s): VITAMINB12, FOLATE, FERRITIN, TIBC, IRON, RETICCTPCT in the last 72 hours. Sepsis Labs: Recent Labs  Lab 03/22/18 1643 03/26/18 1637 03/26/18 1954  LATICACIDVEN 2.5* 0.8 0.8    Recent Results (from the past 240 hour(s))  Blood Culture (routine x 2)     Status: None   Collection Time: 03/22/18  5:28 PM  Result Value Ref Range Status   Specimen Description BLOOD RIGHT FOREARM  Final   Special Requests   Final    BOTTLES DRAWN AEROBIC AND ANAEROBIC Blood Culture adequate volume   Culture   Final    NO GROWTH 5 DAYS Performed at [facility], [address], [place], [place] [address]    Report Status 03/27/2018 FINAL  Final  Blood Culture (routine x 2)     Status: None   Collection Time: 03/22/18  5:29 PM  Result Value Ref Range Status   Specimen Description BLOOD LEFT FOREARM  Final   Special Requests   Final    BOTTLES DRAWN AEROBIC AND ANAEROBIC Blood Culture results may not be optimal due to an inadequate volume of blood received in culture bottles   Culture   Final    NO GROWTH 5 DAYS Performed at [facility], [address] [place], [place] [address]    Report Status 03/27/2018 FINAL  Final  MRSA PCR Screening     Status: None   Collection Time: 03/23/18  8:12 AM  Result Value Ref Range Status   MRSA by PCR NEGATIVE NEGATIVE Final    Comment:        The GeneXpert MRSA Assay (FDA approved for NASAL specimens only), is one component of a comprehensive MRSA colonization surveillance program. It is not intended to diagnose MRSA infection nor to guide or monitor treatment for MRSA infections. Performed at [facility], [address], [place], [place] [address]   Urine culture      Status: None   Collection Time: 03/23/18  2:29 PM  Result Value Ref Range Status   Specimen Description URINE, CATHETERIZED  Final   Special Requests NONE  Final   Culture   Final    NO GROWTH Performed at [facility], [address] [place], [place] [address]    Report Status 03/24/2018 FINAL  Final         Radiology Studies: Mr Laqueta Jean QG Contrast  Result Date: 03/26/2018 CLINICAL DATA:  Altered mental status. History of multiple sclerosis, spastic paraplegia, alcohol abuse. EXAM: MRI HEAD WITHOUT AND WITH CONTRAST MRI CERVICAL SPINE WITHOUT AND WITH CONTRAST TECHNIQUE: Multiplanar, multiecho pulse sequences of the brain and surrounding structures, and cervical spine, to include the craniocervical junction and cervicothoracic junction, were obtained without and with intravenous contrast. CONTRAST:  7.5 cc Gadavist COMPARISON:  CT HEAD March 22, 2018 and MRI of the head January 26, 2016. FINDINGS: MRI HEAD FINDINGS-mild motion degraded noncontrast examination, however post gadolinium sequences are moderately motion degraded. INTRACRANIAL CONTENTS: No reduced diffusion to suggest acute ischemia. Faint areas of T2 shine through corresponding to chronic demyelinating lesions. No susceptibility artifact to suggest hemorrhage. Similar appearance of far > 10 supratentorial white matter lesions and, lesions within the thalami extending to cerebral peduncle. RIGHT frontoparietal cortical lesion. No infratentorial lesions are difficult to identify due to motion. Similar mild global parenchymal brain volume loss. No hydrocephalus. No abnormal intraparenchymal or extra-axial enhancement. No extra-axial fluid collections. VASCULAR: Normal major intracranial vascular flow voids present at skull base. SKULL AND UPPER CERVICAL SPINE: No abnormal sellar expansion. No suspicious calvarial bone marrow signal. Craniocervical junction maintained. SINUSES/ORBITS: Mild paranasal sinus mucosal thickening  without air-fluid levels. Mastoid air cells are well aerated.The included ocular globes and orbital contents are non-suspicious. OTHER: None. MRI CERVICAL SPINE FINDINGS-moderately motion degraded examination. ALIGNMENT: Maintained cervical lordosis.  No malalignment. VERTEBRAE/DISCS: Vertebral bodies are intact. Moderate C5-6 disc height loss with proportional chronic discogenic endplate changes. No suspicious or acute bone marrow signal. No abnormal osseous or disc enhancement. CORD:Limited assessment due to motion. Multiple T2 bright lesions in the spinal cord including subcentimeter LEFT hemi cord at C2-3, 12 mm RIGHT hemicord lesion at C3, 12 mm RIGHT  hemicord lesion C4-5 subcentimeter LEFT hemicord lesion at C5-6, subcentimeter suspected RIGHT dorsal column lesion at C6-7. Limited assessment for myelomalacia due to motion. No abnormal spinal cord, leptomeningeal or epidural enhancement. POSTERIOR FOSSA, VERTEBRAL ARTERIES, PARASPINAL TISSUES: No MR findings of ligamentous injury. Vertebral artery flow voids present. Included posterior fossa and paraspinal soft tissues are normal. DISC LEVELS: C2-3: Small central disc protrusion, mild facet arthropathy without canal stenosis or neural foraminal narrowing. C3-4: Small broad-based disc bulge eccentric laterally, uncovertebral hypertrophy. No canal stenosis. Moderate RIGHT neural foraminal narrowing. C4-5: Annular bulging, uncovertebral hypertrophy and mild facet arthropathy. No canal stenosis. Moderate to severe RIGHT and moderate LEFT neural foraminal narrowing. C5-6: Small broad-based disc bulge and moderate LEFT subarticular disc protrusion. Uncovertebral hypertrophy and mild facet arthropathy. Mild canal stenosis. Moderate RIGHT and severe LEFT neural foraminal narrowing. C6-7: Small broad-based central disc protrusion, uncovertebral hypertrophy. No canal stenosis or neural foraminal narrowing. C7-T1: No disc bulge, canal stenosis nor neural foraminal  narrowing. IMPRESSION: MRI HEAD: 1. No acute intracranial process on this motion degraded examination. 2. Similar appearance of > 10 chronic demyelinating lesions including RIGHT frontoparietal cortex and deep gray nuclei. 3. Mild parenchymal brain volume loss for age. MRI CERVICAL SPINE: 1. Moderately motion degraded examination. At least 4 spinal cord lesions compatible with chronic demyelination. No abnormal enhancement. 2. Degenerative change of the cervical spine resulting in mild canal stenosis at C5-6. 3. Neural foraminal narrowing C3-4 through C5-6: Severe on the LEFT at C5-6. Electronically Signed   By: Awilda Metro M.D.   On: 03/26/2018 15:07   Mr Cervical Spine W Wo Contrast  Result Date: 03/26/2018 CLINICAL DATA:  Altered mental status. History of multiple sclerosis, spastic paraplegia, alcohol abuse. EXAM: MRI HEAD WITHOUT AND WITH CONTRAST MRI CERVICAL SPINE WITHOUT AND WITH CONTRAST TECHNIQUE: Multiplanar, multiecho pulse sequences of the brain and surrounding structures, and cervical spine, to include the craniocervical junction and cervicothoracic junction, were obtained without and with intravenous contrast. CONTRAST:  7.5 cc Gadavist COMPARISON:  CT HEAD March 22, 2018 and MRI of the head January 26, 2016. FINDINGS: MRI HEAD FINDINGS-mild motion degraded noncontrast examination, however post gadolinium sequences are moderately motion degraded. INTRACRANIAL CONTENTS: No reduced diffusion to suggest acute ischemia. Faint areas of T2 shine through corresponding to chronic demyelinating lesions. No susceptibility artifact to suggest hemorrhage. Similar appearance of far > 10 supratentorial white matter lesions and, lesions within the thalami extending to cerebral peduncle. RIGHT frontoparietal cortical lesion. No infratentorial lesions are difficult to identify due to motion. Similar mild global parenchymal brain volume loss. No hydrocephalus. No abnormal intraparenchymal or extra-axial  enhancement. No extra-axial fluid collections. VASCULAR: Normal major intracranial vascular flow voids present at skull base. SKULL AND UPPER CERVICAL SPINE: No abnormal sellar expansion. No suspicious calvarial bone marrow signal. Craniocervical junction maintained. SINUSES/ORBITS: Mild paranasal sinus mucosal thickening without air-fluid levels. Mastoid air cells are well aerated.The included ocular globes and orbital contents are non-suspicious. OTHER: None. MRI CERVICAL SPINE FINDINGS-moderately motion degraded examination. ALIGNMENT: Maintained cervical lordosis.  No malalignment. VERTEBRAE/DISCS: Vertebral bodies are intact. Moderate C5-6 disc height loss with proportional chronic discogenic endplate changes. No suspicious or acute bone marrow signal. No abnormal osseous or disc enhancement. CORD:Limited assessment due to motion. Multiple T2 bright lesions in the spinal cord including subcentimeter LEFT hemi cord at C2-3, 12 mm RIGHT hemicord lesion at C3, 12 mm RIGHT hemicord lesion C4-5 subcentimeter LEFT hemicord lesion at C5-6, subcentimeter suspected RIGHT dorsal column lesion at C6-7. Limited assessment for myelomalacia  due to motion. No abnormal spinal cord, leptomeningeal or epidural enhancement. POSTERIOR FOSSA, VERTEBRAL ARTERIES, PARASPINAL TISSUES: No MR findings of ligamentous injury. Vertebral artery flow voids present. Included posterior fossa and paraspinal soft tissues are normal. DISC LEVELS: C2-3: Small central disc protrusion, mild facet arthropathy without canal stenosis or neural foraminal narrowing. C3-4: Small broad-based disc bulge eccentric laterally, uncovertebral hypertrophy. No canal stenosis. Moderate RIGHT neural foraminal narrowing. C4-5: Annular bulging, uncovertebral hypertrophy and mild facet arthropathy. No canal stenosis. Moderate to severe RIGHT and moderate LEFT neural foraminal narrowing. C5-6: Small broad-based disc bulge and moderate LEFT subarticular disc protrusion.  Uncovertebral hypertrophy and mild facet arthropathy. Mild canal stenosis. Moderate RIGHT and severe LEFT neural foraminal narrowing. C6-7: Small broad-based central disc protrusion, uncovertebral hypertrophy. No canal stenosis or neural foraminal narrowing. C7-T1: No disc bulge, canal stenosis nor neural foraminal narrowing. IMPRESSION: MRI HEAD: 1. No acute intracranial process on this motion degraded examination. 2. Similar appearance of > 10 chronic demyelinating lesions including RIGHT frontoparietal cortex and deep gray nuclei. 3. Mild parenchymal brain volume loss for age. MRI CERVICAL SPINE: 1. Moderately motion degraded examination. At least 4 spinal cord lesions compatible with chronic demyelination. No abnormal enhancement. 2. Degenerative change of the cervical spine resulting in mild canal stenosis at C5-6. 3. Neural foraminal narrowing C3-4 through C5-6: Severe on the LEFT at C5-6. Electronically Signed   By: Awilda Metro M.D.   On: 03/26/2018 15:07        Scheduled Meds: . atorvastatin  40 mg Oral Daily  . baclofen  5 mg Oral TID  . bisacodyl  10 mg Rectal Daily  . cholecalciferol  4,000 Units Oral Daily  . feeding supplement (ENSURE ENLIVE)  1 Bottle Oral BID WC  . FLUoxetine  20 mg Oral Daily  . heparin  5,000 Units Subcutaneous Q8H  . multivitamin with minerals  1 tablet Oral Daily  . nystatin  1 g Topical QID  . Oxcarbazepine  300 mg Oral BID  . pantoprazole  40 mg Oral Daily  . polyethylene glycol  17 g Oral BID  . vitamin C  250 mg Oral Daily   Continuous Infusions: . sodium chloride 100 mL/hr at 03/27/18 1100     LOS: 5 days    Time spent: 35 minutes.     Alba Cory, MD [facility]  Password [facility] 03/27/2018, 3:19 PM

## 2018-03-27 NOTE — Progress Notes (Signed)
On call chaplain paged for assistants with HPOA paperwork for pt.   Travis Cantrell

## 2018-03-28 DIAGNOSIS — I10 Essential (primary) hypertension: Secondary | ICD-10-CM

## 2018-03-28 DIAGNOSIS — G8250 Quadriplegia, unspecified: Secondary | ICD-10-CM

## 2018-03-28 DIAGNOSIS — G92 Toxic encephalopathy: Principal | ICD-10-CM

## 2018-03-28 DIAGNOSIS — Z960 Presence of urogenital implants: Secondary | ICD-10-CM

## 2018-03-28 MED ORDER — IBUPROFEN 800 MG PO TABS: 800.0000 mg | ORAL_TABLET | Freq: Three times a day (TID) | ORAL | Status: DC | PRN

## 2018-03-28 MED ORDER — POLYETHYLENE GLYCOL 3350 17 G PO PACK: 17.0000 g | PACK | Freq: Two times a day (BID) | ORAL | 0 refills | Status: DC

## 2018-03-28 MED ORDER — FLUOXETINE HCL 20 MG PO CAPS: 20.0000 mg | ORAL_CAPSULE | Freq: Every day | ORAL | Status: DC

## 2018-03-28 MED ORDER — TRAMADOL HCL 50 MG PO TABS: 50.0000 mg | ORAL_TABLET | Freq: Four times a day (QID) | ORAL | Status: AC | PRN

## 2018-03-28 MED ORDER — OMEPRAZOLE 20 MG PO CPDR: 20.0000 mg | DELAYED_RELEASE_CAPSULE | Freq: Two times a day (BID) | ORAL | Status: DC

## 2018-03-28 MED ORDER — DIAZEPAM 5 MG PO TABS: 5.0000 mg | ORAL_TABLET | Freq: Two times a day (BID) | ORAL | Status: DC | PRN

## 2018-03-28 NOTE — Progress Notes (Signed)
Pt home with PTAR.

## 2018-03-28 NOTE — Progress Notes (Addendum)
CSW following for discharge plan. CSW received update from [facility] that they are unable to accept patient. CSW contacted patient's sister to discuss, provide update that [name] to offer a bed. Patient's sister stated that she had been in the room when the doctor had rounded, and they all discussed the possibility of patient going home instead of to SNF, and reported that going home was what they wanted to do. CSW discussed how therapy team was still recommending rehab for him, but sister said that the doctor had talked with them about going home and that the patient preferred to go home instead.   CSW signing off for now, as it appears that the family is refusing to continue to pursue SNF placement at this time.   Laveda Abbe, [place] Clinical Social Worker [phone]   ADDENDUM 3:26 PM:  MD reported to CSW that patient is adamantly refusing SNF now that he is oriented; he does not want to admit to SNF, he wants to return home. Family is agreeable to plan, as well. MD is ok with discharge home given that patient is refusing and family is on board.   CSW met with patient to discuss home health needs. Patient agreeable to home health. CSW presented Medicare list; no preference given. CSW discussed equipment needs, and patient has all equipment needed at home already. CSW provided referral to [facility], who said they'd be able to accept the patient. Patient will need PTAR home when discharged.  Laveda Abbe, [place] Clinical Social Worker [phone]

## 2018-03-28 NOTE — Progress Notes (Signed)
Occupational Therapy Treatment Patient Details Name: Travis Cantrell MRN: [id number] DOB: April 09, 1960 Today's Date: 03/28/2018    History of present illness Patient is a 58 y/o male presenting to the ED on 03/22/2018 with AMS. Past medical history significant of spastic paraplegia, multiple sclerosis, hypertension, previous decubitus ulcer, alcohol abuse and GERD. Admitted for suspected metabolic encephalopathy probably secondary to UTI.    OT comments  Pt presents supine in bed pleasant and willing to participate in therapy session. Pt requiring totalA+2 for rolling to complete peri-care after incontinent BM and changing of bed linens. Pt continues to have increased tone in bil UEs, will continue to assess and provide splinting needs as appropriate. Will likely benefit from LUE resting hand splint as pt reports he was previously able to use L hand for self-feeding. At this time pt unable to actively engage L hand for functional tasks. Continue to recommend SNF level therapy services at time of discharge however per chart review pt preferring to return home. Will continue to follow acutely to progress pt towards established OT goals.   Follow Up Recommendations  SNF;Supervision/Assistance - 24 hour(24hr vs SNF pending level of assist available at home)    Equipment Recommendations  None recommended by OT          Precautions / Restrictions Precautions Precautions: Fall Precaution Comments: RUE contracted Restrictions Weight Bearing Restrictions: No       Mobility Bed Mobility Overal bed mobility: Needs Assistance Bed Mobility: Rolling Rolling: Max assist;Total assist;+2 for physical assistance         General bed mobility comments: Max/total assist for rolling R<>L x2 to place/remove maxi move pad. Maxi move batteries all dead so not able to perform transfer to chair today,  Transfers                      Balance                                            ADL either performed or assessed with clinical judgement   ADL Overall ADL's : Needs assistance/impaired         Upper Body Bathing: Total assistance;Bed level       Upper Body Dressing : Total assistance;Bed level Upper Body Dressing Details (indicate cue type and reason): donning new gown         Toileting- Clothing Manipulation and Hygiene: Total assistance;+2 for physical assistance;+2 for safety/equipment;Bed level Toileting - Clothing Manipulation Details (indicate cue type and reason): pt with incontinent BM at bed level while completing bed mobility, totalA+2 for pericare and to change bed linens        General ADL Comments: pt continues to have significant tone in bil UEs     Vision       Perception     Praxis      Cognition Arousal/Alertness: Awake/alert Behavior During Therapy: WFL for tasks assessed/performed Overall Cognitive Status: No family/caregiver present to determine baseline cognitive functioning                                 General Comments: PLeasant, follows 1-2 step commands consistently. Slow to respond at times. difficult to obtain full clarification related to prior function/use of LUE        Exercises Exercises: Other exercises Other Exercises  Other Exercises: passive stretching to bil UEs within permissible range   Shoulder Instructions       General Comments      Pertinent Vitals/ Pain       Pain Assessment: No/denies pain Faces Pain Scale: Hurts little more  Home Living                                          Prior Functioning/Environment              Frequency  Min 2X/week        Progress Toward Goals  OT Goals(current goals can now be found in the care plan section)  Progress towards OT goals: OT to reassess next treatment  Acute Rehab OT Goals Patient Stated Goal: return home  OT Goal Formulation: With patient Time For Goal Achievement: 04/06/18 Potential to  Achieve Goals: Fair ADL Goals Pt Will Perform Eating: with min assist;with adaptive utensils Pt/caregiver will Perform Home Exercise Program: Right Upper extremity;Left upper extremity;With minimal assist Additional ADL Goal #1: Pt caregiver will verbalize understanding of education for proper positioning/use of palm guard to decrease risk for skin breakdown and further contracture.  Plan Discharge plan remains appropriate    Co-evaluation                 AM-PAC OT "6 Clicks" Daily Activity     Outcome Measure   Help from another person eating meals?: Total Help from another person taking care of personal grooming?: Total Help from another person toileting, which includes using toliet, bedpan, or urinal?: Total Help from another person bathing (including washing, rinsing, drying)?: Total Help from another person to put on and taking off regular upper body clothing?: Total Help from another person to put on and taking off regular lower body clothing?: Total 6 Click Score: 6    End of Session    OT Visit Diagnosis: Muscle weakness (generalized) (M62.81)   Activity Tolerance Patient tolerated treatment well   Patient Left in bed;with call bell/phone within reach;with bed alarm set(soft touch call bell located within reach of LUE)   Nurse Communication Mobility status        Time: 7322-0254 OT Time Calculation (min): 23 min  Charges: OT General Charges $OT Visit: 1 Visit OT Treatments $Self Care/Home Management : 8-22 mins  Marcy Siren, OT Supplemental Rehabilitation Services Pager [phone] Office [phone]    Orlando Penner 03/28/2018, 4:38 PM

## 2018-03-28 NOTE — Progress Notes (Signed)
CSW following for discharge plan. CSW attempted to contact patient's sister throughout the day today, kept leaving voicemails and missing her calls back. CSW finally spoke with patient's sister this afternoon to discuss bed offers. Patient's sister would like CSW to reach out to [facility], they would like to see if he'd be accepted there. CSW contacted [facility], and asked them to review referral.  CSW to follow.  Blenda Nicely, [place] Clinical Social Worker [phone]

## 2018-03-28 NOTE — Plan of Care (Signed)
Progressing toward goal

## 2018-03-28 NOTE — Discharge Summary (Signed)
Physician Discharge Summary  Travis Cantrell NLZ:[id number] DOB: [birth year]  PCP: Excell Seltzer, MD  Admit date: 03/22/2018 Discharge date: 03/28/2018  Recommendations for Outpatient Follow-up:  1. Dr. Kerby Nora in 1 week with repeat labs (CBC & BMP). 2. Dr. Despina Arias, Neurology in 1 week.  Home Health: PT, OT and RN.  Patient and family declined SNF. Equipment/Devices: None  Discharge Condition: Improved and stable CODE STATUS: Full Diet recommendation: Heart healthy diet.  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  Patient's nurse apparently called EMS complaining of mental status change.  Patient reported that he drank 3 beers prior to symptom onset.  His urine was noted to be turbid on admission and patient was drowsy.  He was admitted for acute metabolic encephalopathy initially suspected due to UTI.  Assessment and plan:  1. Acute toxic metabolic encephalopathy: Less likely due to infectious etiology and probably due to polypharmacy in the context of alcohol use.  CT head negative for acute findings.  Although he was treated for UTI, final urine culture results showed no growth making UTI less likely.  As per discussion with patient and family today, patient was recently started on Trileptal and baclofen for trigeminal neuralgia and spasticity  respectively during outpatient neurology visit on 2/25.  Patient reportedly has been taking Trileptal along with Tegretol.  Discontinued baclofen which he had not been taking until recent outpatient neurology visit.  Discontinued carbamazepine and continue recently started Trileptal.  Reduced Prozac dose.  Patient also reported not taking gabapentin and hence discontinued.  Currently resolved. 2. Chronic indwelling Foley catheter/asymptomatic bacteriuria: He has chronic indwelling Foley catheter due to his multiple sclerosis.  This is changed every 2 weeks as outpatient.  Urine microscopy was suggestive of UTI.  Foley catheter was changed this admission.  Patient was treated with cefepime.  However urine culture showed no growth.  No further antibiotics.  Continue appropriate Foley catheter management at home.  He does have microscopic hematuria on urine microscopy which may be related to Foley related trauma but may consider outpatient evaluation as deemed necessary. 3. Hypotension: Likely multifactorial related to hypovolemia and medications.  Resolved after IV fluids. 4. Acute renal failure: Creatinine 1.6 on admission.  Due to dehydration and ACEI.  Resolved after IV fluids and holding ACEI temporarily.  Follow BMP closely as outpatient. 5. Multiple sclerosis/spastic quadriparesis: Patient chronically bedridden at baseline, has 24/7 caregiver to assist, previously has had sacral decubitus ulcer which has healed, follows with outpatient neurology-continue.  Patient on polypharmacy which have been carefully reviewed and optimized as noted above.  Discontinued baclofen, carbamazepine, gabapentin.  He was complaining of worsening left upper extremity weakness.  Hence underwent MRI brain and cervical spine which shows stable MS plaque.  His case was discussed with neurology who felt that his weakness could be related to pseudo-MS flare due to possible UTI and metabolic derangements.  His left upper extremity  weakness has resolved  and strength is back to baseline.  His case was also discussed with neurosurgery who did not think that the weakness was related to spinal stenosis. 6. Essential hypertension: Transiently had hypotension, lisinopril was held.  Hypotension resolved.  Blood pressure starting to increase and hence resume lisinopril at discharge. 7. GERD: Continue PPI. 8. Hyperlipidemia: Continue statins. 9. Constipation: Continue bowel regimen. 10. Prior history of sacral decubitus ulcer: As per RN, no active ulcer at this time. 11. Trigeminal neuralgia: Recently started on Trileptal, continue.  Outpatient follow-up with neurology.   Consultations:  None  Procedures:  Foley catheter changed this admission   Discharge Instructions  Discharge Instructions    Call MD for:   Complete by:  As directed    Recurrent confusion or altered mental status.   Call MD for:  difficulty breathing, headache or visual disturbances   Complete by:  As directed    Call MD for:  extreme fatigue   Complete by:  As directed    Call MD for:  persistant dizziness or light-headedness   Complete by:  As directed    Call MD for:  persistant nausea and vomiting   Complete by:  As directed    Call MD for:  redness, tenderness, or signs of infection (pain, swelling, redness, odor or green/yellow discharge around incision site)   Complete by:  As directed    Call MD for:  severe uncontrolled pain   Complete by:  As directed    Call MD for:  temperature >100.4   Complete by:  As directed    Diet - low sodium heart healthy   Complete by:  As directed    Increase activity slowly   Complete by:  As directed        Medication List    STOP taking these medications   baclofen 10 MG tablet Commonly known as:  LIORESAL   diphenhydrAMINE 25 mg capsule Commonly known as:  BENADRYL   EPITOL 200 MG tablet Generic drug:  carbamazepine   feeding supplement (ENSURE ENLIVE) Liqd   gabapentin 300 MG  capsule Commonly known as:  NEURONTIN     TAKE these medications   albuterol (2.5 MG/3ML) 0.083% nebulizer solution Commonly known as:  PROVENTIL Take 3 mLs (2.5 mg total) by nebulization every 4 (four) hours as needed for wheezing or shortness of breath.   atorvastatin 40 MG tablet Commonly known as:  LIPITOR Take 40 mg by mouth daily.   bisacodyl 5 MG EC tablet Commonly known as:  DULCOLAX Take 1 tablet (5 mg total) by mouth daily as needed for moderate constipation.   bisacodyl 10 MG suppository Commonly known as:  DULCOLAX Place 1 suppository (10 mg total) rectally as needed for moderate constipation.   butalbital-acetaminophen-caffeine 50-325-40 MG tablet Commonly known as:  FIORICET, ESGIC Take 1 tablet by mouth every 6 (six) hours as needed for headache or migraine.   CRANBERRY PO Take 1 capsule by mouth daily.   diazepam 5 MG tablet Commonly known as:  VALIUM Take 1 tablet (5 mg total) by mouth 2 (two) times daily as needed for muscle spasms.   DOVER HYDROGEL FOLEY CATH 18FR Misc Change cathter every 2 weeks   FLUoxetine 20 MG capsule Commonly known as:  PROZAC Take 1 capsule (20 mg total) by mouth daily. What changed:  See the new instructions.   ibuprofen 800 MG tablet Commonly known as:  ADVIL,MOTRIN Take 1 tablet (800 mg total) by mouth every 8 (eight) hours as needed  for headache.   lisinopril 20 MG tablet Commonly known as:  PRINIVIL,ZESTRIL TAKE 1 TABLET BY MOUTH ONCE DAILY   loratadine 10 MG tablet Commonly known as:  CLARITIN Take 10 mg by mouth daily as needed for allergies.   multivitamin with minerals Tabs tablet Take 1 tablet by mouth daily.   nystatin powder Generic drug:  nystatin Apply 1 g topically 4 (four) times daily. Apply for rash.   omeprazole 20 MG capsule Commonly known as:  PRILOSEC Take 1 capsule (20 mg total) by mouth 2 (two) times daily before a meal.   Oxcarbazepine 300 MG tablet Commonly known as:  TRILEPTAL Take 1  tablet (300 mg total) by mouth 2 (two) times daily.   polyethylene glycol packet Commonly known as:  MIRALAX / GLYCOLAX Take 17 g by mouth 2 (two) times daily.   traMADol 50 MG tablet Commonly known as:  ULTRAM Take 1 tablet (50 mg total) by mouth every 6 (six) hours as needed for moderate pain.   Vitamin D3 50 MCG (2000 UT) Tabs Take 4,000 Units by mouth daily.      Follow-up Information    Excell Seltzer, MD. Schedule an appointment as soon as possible for a visit in 1 week(s).   Specialty:  Family Medicine Why:  To be seen with repeat labs (CBC & BMP). Contact information: [address] [place] [address] [phone]        Asa Lente, MD. Schedule an appointment as soon as possible for a visit in 1 week(s).   Specialty:  Neurology Contact information: [address] [place] [address] [phone]          Allergies  Allergen Reactions  . Baclofen Diarrhea      Procedures/Studies: Ct Head Wo Contrast  Result Date: 03/22/2018 CLINICAL DATA:  Altered mental status EXAM: CT HEAD WITHOUT CONTRAST TECHNIQUE: Contiguous axial images were obtained from the base of the skull through the vertex without intravenous contrast. COMPARISON:  12/13/2017 FINDINGS: Brain: Mild cerebral atrophy. No acute intracranial abnormality. Specifically, no hemorrhage, hydrocephalus, mass lesion, acute infarction, or significant intracranial injury. Vascular: No hyperdense vessel or unexpected calcification. Skull: No acute calvarial abnormality. Sinuses/Orbits: No acute finding Other: None IMPRESSION: Field mild cerebral atrophy.  No acute intracranial abnormality. Electronically Signed   By: Charlett Nose M.D.   On: 03/22/2018 19:19   Mr Laqueta Jean VH Contrast  Result Date: 03/26/2018 CLINICAL DATA:  Altered mental status. History of multiple sclerosis, spastic paraplegia, alcohol abuse. EXAM: MRI HEAD WITHOUT AND WITH CONTRAST MRI CERVICAL SPINE WITHOUT AND WITH  CONTRAST TECHNIQUE: Multiplanar, multiecho pulse sequences of the brain and surrounding structures, and cervical spine, to include the craniocervical junction and cervicothoracic junction, were obtained without and with intravenous contrast. CONTRAST:  7.5 cc Gadavist COMPARISON:  CT HEAD March 22, 2018 and MRI of the head January 26, 2016. FINDINGS: MRI HEAD FINDINGS-mild motion degraded noncontrast examination, however post gadolinium sequences are moderately motion degraded. INTRACRANIAL CONTENTS: No reduced diffusion to suggest acute ischemia. Faint areas of T2 shine through corresponding to chronic demyelinating lesions. No susceptibility artifact to suggest hemorrhage. Similar appearance of far > 10 supratentorial white matter lesions and, lesions within the thalami extending to cerebral peduncle. RIGHT frontoparietal cortical lesion. No infratentorial lesions are difficult to identify due to motion. Similar mild global parenchymal brain volume loss. No hydrocephalus. No abnormal intraparenchymal or extra-axial enhancement. No extra-axial fluid collections. VASCULAR: Normal major intracranial vascular flow voids present at skull base.  SKULL AND UPPER CERVICAL SPINE: No abnormal sellar expansion. No suspicious calvarial bone marrow signal. Craniocervical junction maintained. SINUSES/ORBITS: Mild paranasal sinus mucosal thickening without air-fluid levels. Mastoid air cells are well aerated.The included ocular globes and orbital contents are non-suspicious. OTHER: None. MRI CERVICAL SPINE FINDINGS-moderately motion degraded examination. ALIGNMENT: Maintained cervical lordosis.  No malalignment. VERTEBRAE/DISCS: Vertebral bodies are intact. Moderate C5-6 disc height loss with proportional chronic discogenic endplate changes. No suspicious or acute bone marrow signal. No abnormal osseous or disc enhancement. CORD:Limited assessment due to motion. Multiple T2 bright lesions in the spinal cord including  subcentimeter LEFT hemi cord at C2-3, 12 mm RIGHT hemicord lesion at C3, 12 mm RIGHT hemicord lesion C4-5 subcentimeter LEFT hemicord lesion at C5-6, subcentimeter suspected RIGHT dorsal column lesion at C6-7. Limited assessment for myelomalacia due to motion. No abnormal spinal cord, leptomeningeal or epidural enhancement. POSTERIOR FOSSA, VERTEBRAL ARTERIES, PARASPINAL TISSUES: No MR findings of ligamentous injury. Vertebral artery flow voids present. Included posterior fossa and paraspinal soft tissues are normal. DISC LEVELS: C2-3: Small central disc protrusion, mild facet arthropathy without canal stenosis or neural foraminal narrowing. C3-4: Small broad-based disc bulge eccentric laterally, uncovertebral hypertrophy. No canal stenosis. Moderate RIGHT neural foraminal narrowing. C4-5: Annular bulging, uncovertebral hypertrophy and mild facet arthropathy. No canal stenosis. Moderate to severe RIGHT and moderate LEFT neural foraminal narrowing. C5-6: Small broad-based disc bulge and moderate LEFT subarticular disc protrusion. Uncovertebral hypertrophy and mild facet arthropathy. Mild canal stenosis. Moderate RIGHT and severe LEFT neural foraminal narrowing. C6-7: Small broad-based central disc protrusion, uncovertebral hypertrophy. No canal stenosis or neural foraminal narrowing. C7-T1: No disc bulge, canal stenosis nor neural foraminal narrowing. IMPRESSION: MRI HEAD: 1. No acute intracranial process on this motion degraded examination. 2. Similar appearance of > 10 chronic demyelinating lesions including RIGHT frontoparietal cortex and deep gray nuclei. 3. Mild parenchymal brain volume loss for age. MRI CERVICAL SPINE: 1. Moderately motion degraded examination. At least 4 spinal cord lesions compatible with chronic demyelination. No abnormal enhancement. 2. Degenerative change of the cervical spine resulting in mild canal stenosis at C5-6. 3. Neural foraminal narrowing C3-4 through C5-6: Severe on the LEFT at  C5-6. Electronically Signed   By: Awilda Metro M.D.   On: 03/26/2018 15:07   Mr Cervical Spine W Wo Contrast  Result Date: 03/26/2018 CLINICAL DATA:  Altered mental status. History of multiple sclerosis, spastic paraplegia, alcohol abuse. EXAM: MRI HEAD WITHOUT AND WITH CONTRAST MRI CERVICAL SPINE WITHOUT AND WITH CONTRAST TECHNIQUE: Multiplanar, multiecho pulse sequences of the brain and surrounding structures, and cervical spine, to include the craniocervical junction and cervicothoracic junction, were obtained without and with intravenous contrast. CONTRAST:  7.5 cc Gadavist COMPARISON:  CT HEAD March 22, 2018 and MRI of the head January 26, 2016. FINDINGS: MRI HEAD FINDINGS-mild motion degraded noncontrast examination, however post gadolinium sequences are moderately motion degraded. INTRACRANIAL CONTENTS: No reduced diffusion to suggest acute ischemia. Faint areas of T2 shine through corresponding to chronic demyelinating lesions. No susceptibility artifact to suggest hemorrhage. Similar appearance of far > 10 supratentorial white matter lesions and, lesions within the thalami extending to cerebral peduncle. RIGHT frontoparietal cortical lesion. No infratentorial lesions are difficult to identify due to motion. Similar mild global parenchymal brain volume loss. No hydrocephalus. No abnormal intraparenchymal or extra-axial enhancement. No extra-axial fluid collections. VASCULAR: Normal major intracranial vascular flow voids present at skull base. SKULL AND UPPER CERVICAL SPINE: No abnormal sellar expansion. No suspicious calvarial bone marrow signal. Craniocervical junction maintained. SINUSES/ORBITS: Mild paranasal  sinus mucosal thickening without air-fluid levels. Mastoid air cells are well aerated.The included ocular globes and orbital contents are non-suspicious. OTHER: None. MRI CERVICAL SPINE FINDINGS-moderately motion degraded examination. ALIGNMENT: Maintained cervical lordosis.  No  malalignment. VERTEBRAE/DISCS: Vertebral bodies are intact. Moderate C5-6 disc height loss with proportional chronic discogenic endplate changes. No suspicious or acute bone marrow signal. No abnormal osseous or disc enhancement. CORD:Limited assessment due to motion. Multiple T2 bright lesions in the spinal cord including subcentimeter LEFT hemi cord at C2-3, 12 mm RIGHT hemicord lesion at C3, 12 mm RIGHT hemicord lesion C4-5 subcentimeter LEFT hemicord lesion at C5-6, subcentimeter suspected RIGHT dorsal column lesion at C6-7. Limited assessment for myelomalacia due to motion. No abnormal spinal cord, leptomeningeal or epidural enhancement. POSTERIOR FOSSA, VERTEBRAL ARTERIES, PARASPINAL TISSUES: No MR findings of ligamentous injury. Vertebral artery flow voids present. Included posterior fossa and paraspinal soft tissues are normal. DISC LEVELS: C2-3: Small central disc protrusion, mild facet arthropathy without canal stenosis or neural foraminal narrowing. C3-4: Small broad-based disc bulge eccentric laterally, uncovertebral hypertrophy. No canal stenosis. Moderate RIGHT neural foraminal narrowing. C4-5: Annular bulging, uncovertebral hypertrophy and mild facet arthropathy. No canal stenosis. Moderate to severe RIGHT and moderate LEFT neural foraminal narrowing. C5-6: Small broad-based disc bulge and moderate LEFT subarticular disc protrusion. Uncovertebral hypertrophy and mild facet arthropathy. Mild canal stenosis. Moderate RIGHT and severe LEFT neural foraminal narrowing. C6-7: Small broad-based central disc protrusion, uncovertebral hypertrophy. No canal stenosis or neural foraminal narrowing. C7-T1: No disc bulge, canal stenosis nor neural foraminal narrowing. IMPRESSION: MRI HEAD: 1. No acute intracranial process on this motion degraded examination. 2. Similar appearance of > 10 chronic demyelinating lesions including RIGHT frontoparietal cortex and deep gray nuclei. 3. Mild parenchymal brain volume loss  for age. MRI CERVICAL SPINE: 1. Moderately motion degraded examination. At least 4 spinal cord lesions compatible with chronic demyelination. No abnormal enhancement. 2. Degenerative change of the cervical spine resulting in mild canal stenosis at C5-6. 3. Neural foraminal narrowing C3-4 through C5-6: Severe on the LEFT at C5-6. Electronically Signed   By: Awilda Metro M.D.   On: 03/26/2018 15:07      Subjective: Patient interviewed and examined this morning along with his sisters and RN at bedside.  Patient denies complaints.  Reports that his left upper extremity weakness is resolved and its back to prior baseline which is chronic weakness.  Denies any other complaints.  As per sister's at bedside, mental status is back to baseline and normal.  Patient refuses SNF admission.  Discharge Exam:  Vitals:   03/28/18 0419 03/28/18 0704 03/28/18 1000 03/28/18 1205  BP: (!) 142/81 129/80  (!) 148/89  Pulse: 71 (!) 58  70  Resp: (!) 22 19  (!) 25  Temp: 98.5 F (36.9 C) 98 F (36.7 C)  98.7 F (37.1 C)  TempSrc: Oral   Axillary  SpO2: 96% 96%  95%  Weight:   77.1 kg   Height:    (1.778 m)     General: Pleasant middle-age male, moderately built and nourished lying comfortably propped up in bed.. Cardiovascular: S1 & S2 heard, RRR, S1/S2 +. No murmurs, rubs, gallops or clicks. No JVD or pedal edema.  Telemetry personally reviewed: Sinus rhythm. Respiratory: Clear to auscultation without wheezing, rhonchi or crackles. No increased work of breathing. Abdominal:  Non distended, non tender & soft. No organomegaly or masses appreciated. Normal bowel sounds heard. CNS: Alert and oriented x3.  No cranial nerve deficits. Extremities: no edema, no  cyanosis.  Bilateral lower extremity grade 0 x 5 power, right upper extremity with contractures and grade 0 x 5 power.  Left upper extremity with grade 2 x 5 power at least.    The results of significant diagnostics from this hospitalization  (including imaging, microbiology, ancillary and laboratory) are listed below for reference.     Microbiology: Recent Results (from the past 240 hour(s))  Blood Culture (routine x 2)     Status: None   Collection Time: 03/22/18  5:28 PM  Result Value Ref Range Status   Specimen Description BLOOD RIGHT FOREARM  Final   Special Requests   Final    BOTTLES DRAWN AEROBIC AND ANAEROBIC Blood Culture adequate volume   Culture   Final    NO GROWTH 5 DAYS Performed at [facility], [address], [place], [place] [address]    Report Status 03/27/2018 FINAL  Final  Blood Culture (routine x 2)     Status: None   Collection Time: 03/22/18  5:29 PM  Result Value Ref Range Status   Specimen Description BLOOD LEFT FOREARM  Final   Special Requests   Final    BOTTLES DRAWN AEROBIC AND ANAEROBIC Blood Culture results may not be optimal due to an inadequate volume of blood received in culture bottles   Culture   Final    NO GROWTH 5 DAYS Performed at [facility], [address] [place], [place] [address]    Report Status 03/27/2018 FINAL  Final  MRSA PCR Screening     Status: None   Collection Time: 03/23/18  8:12 AM  Result Value Ref Range Status   MRSA by PCR NEGATIVE NEGATIVE Final    Comment:        The GeneXpert MRSA Assay (FDA approved for NASAL specimens only), is one component of a comprehensive MRSA colonization surveillance program. It is not intended to diagnose MRSA infection nor to guide or monitor treatment for MRSA infections. Performed at [facility], [address] [place], [place] [address]   Urine culture     Status: None   Collection Time: 03/23/18  2:29 PM  Result Value Ref Range Status   Specimen Description URINE, CATHETERIZED  Final   Special Requests NONE  Final   Culture   Final    NO GROWTH Performed at [facility], [address], [place], [place] [address]    Report Status 03/24/2018 FINAL  Final     Labs: CBC: Recent  Labs  Lab 03/22/18 1553 03/23/18 0540 03/24/18 0609 03/25/18 0505 03/26/18 0518  WBC 19.7* 9.0 10.0 10.3 8.5  NEUTROABS 12.6*  --   --   --   --   HGB 13.9 12.6* 11.9* 12.0* 12.4*  HCT 45.6 41.5 38.4* 38.2* 39.1  MCV 95.0 93.3 91.4 90.7 91.1  PLT 305 217 223 221 263   Basic Metabolic Panel: Recent Labs  Lab 03/22/18 1553 03/23/18 0540 03/24/18 0609 03/25/18 0505 03/26/18 0518  NA 138 140 136 137 141  K 3.5 4.1 4.0 4.0 4.1  CL 109 113* 108 111 112*  CO2 18* 21* 22 21* 25  GLUCOSE 78 81 99 94 109*  BUN 41* 26* 14 15 20   CREATININE 1.65* 1.03 0.78 0.84 0.90  CALCIUM 8.3* 8.0* 8.0* 8.1* 8.2*   Liver Function Tests: Recent Labs  Lab 03/22/18 1553 03/23/18 0540  AST 13* 12*  ALT 11 12  ALKPHOS 62 54  BILITOT 0.6 1.0  PROT 6.2* 5.2*  ALBUMIN 3.2* 2.5*   CBG: Recent Labs  Lab 03/26/18 1601  GLUCAP 116*    Urinalysis    Component Value Date/Time   COLORURINE YELLOW 03/23/2018 1305   APPEARANCEUR HAZY (A) 03/23/2018 1305   APPEARANCEUR Turbid 10/29/2013 1415   LABSPEC 1.013 03/23/2018 1305   LABSPEC 1.016 10/29/2013 1415   PHURINE 6.0 03/23/2018 1305   GLUCOSEU NEGATIVE 03/23/2018 1305   GLUCOSEU Negative 10/29/2013 1415   HGBUR MODERATE (A) 03/23/2018 1305   BILIRUBINUR NEGATIVE 03/23/2018 1305   BILIRUBINUR negative 02/18/2016 1537   BILIRUBINUR Negative 10/29/2013 1415   KETONESUR NEGATIVE 03/23/2018 1305   PROTEINUR NEGATIVE 03/23/2018 1305   UROBILINOGEN 0.2 02/18/2016 1537   UROBILINOGEN 4.0 (H) 05/28/2011 1859   NITRITE NEGATIVE 03/23/2018 1305   LEUKOCYTESUR LARGE (A) 03/23/2018 1305   LEUKOCYTESUR 3+ 10/29/2013 1415   I discussed in detail with patient's 2 sisters at bedside followed by patient's daughter via phone, updated care and answered all questions.   Time coordinating discharge: 40 minutes  SIGNED:  Marcellus Scott, MD, FACP, [facility]. [facility]  To contact the attending provider between 7A-7P or the covering provider during  after hours 7P-7A, please log into the web site [email or web address] and access using universal [place] password for that web site. If you do not have the password, please call the hospital operator.

## 2018-03-29 ENCOUNTER — Telehealth: Payer: Self-pay

## 2018-03-29 DIAGNOSIS — G35 Multiple sclerosis: Secondary | ICD-10-CM

## 2018-03-29 NOTE — Telephone Encounter (Signed)
First attempt to reach patient for transitional care management. No answer. Will continue to follow as appropriate.

## 2018-03-30 ENCOUNTER — Telehealth: Payer: Self-pay | Admitting: Family Medicine

## 2018-03-30 DIAGNOSIS — Z87440 Personal history of urinary (tract) infections: Secondary | ICD-10-CM | POA: Diagnosis not present

## 2018-03-30 DIAGNOSIS — K5900 Constipation, unspecified: Secondary | ICD-10-CM | POA: Diagnosis not present

## 2018-03-30 DIAGNOSIS — Z466 Encounter for fitting and adjustment of urinary device: Secondary | ICD-10-CM | POA: Diagnosis not present

## 2018-03-30 DIAGNOSIS — Z993 Dependence on wheelchair: Secondary | ICD-10-CM | POA: Diagnosis not present

## 2018-03-30 DIAGNOSIS — G35 Multiple sclerosis: Secondary | ICD-10-CM | POA: Diagnosis not present

## 2018-03-30 DIAGNOSIS — M62421 Contracture of muscle, right upper arm: Secondary | ICD-10-CM | POA: Diagnosis not present

## 2018-03-30 DIAGNOSIS — I10 Essential (primary) hypertension: Secondary | ICD-10-CM | POA: Diagnosis not present

## 2018-03-30 DIAGNOSIS — R7303 Prediabetes: Secondary | ICD-10-CM | POA: Diagnosis not present

## 2018-03-30 DIAGNOSIS — G500 Trigeminal neuralgia: Secondary | ICD-10-CM | POA: Diagnosis not present

## 2018-03-30 DIAGNOSIS — L89611 Pressure ulcer of right heel, stage 1: Secondary | ICD-10-CM | POA: Diagnosis not present

## 2018-03-30 DIAGNOSIS — Z9181 History of falling: Secondary | ICD-10-CM | POA: Diagnosis not present

## 2018-03-30 DIAGNOSIS — N319 Neuromuscular dysfunction of bladder, unspecified: Secondary | ICD-10-CM | POA: Diagnosis not present

## 2018-03-30 DIAGNOSIS — G800 Spastic quadriplegic cerebral palsy: Secondary | ICD-10-CM | POA: Diagnosis not present

## 2018-03-30 NOTE — Telephone Encounter (Signed)
Transition Care Management Follow-up Telephone Call Spoke with Travis Cantrell, patient's daughter, ok per DPR on file.  Date discharged? 03/28/2018   How have you been since you were released from the hospital? Patient is doing a little better. He is able to move his left arm now and eating some, he is still weak.   Do you understand why you were in the hospital? Yes   Do you understand the discharge instructions? yes  Where were you discharged to? home  Items Reviewed:  Medications reviewed: yes  Allergies reviewed: yes  Dietary changes reviewed: yes  Referrals reviewed: follow up with PCP and Neurologist.   Functional Questionnaire:   Activities of Daily Living (ADLs):   He states they are independent in the following: none. States they require assistance with the following: ambulation, feeding, incontinence, grooming, dressing, hygiene, bathing, toileting.   Any transportation issues/concerns?: use [facility] transportation, does have an issue with transportation personal not picking patient up. Discussed with daughter that we will try to make sure that patient is picked up.  Any patient concerns? Daughter, Travis Cantrell, said that patient needs a new Nurse, adult. She was not sure how to go about that and what the steps to getting that are. Please give daughter a call with information. CB: [phone]   Confirmed importance and date/time of follow-up visits scheduled yes  Provider Appointment booked with Dr. Ermalene Searing on 04/06/2018  Confirmed with patient if condition begins to worsen call PCP or go to the ER.  Patient was given the office number and encouraged to call back with question or concerns.  : yes

## 2018-03-30 NOTE — Telephone Encounter (Signed)
Please see patient's concern section below. Thank you.

## 2018-03-30 NOTE — Telephone Encounter (Signed)
Verbal orders given to [facility] via telephone for PT 2 x a week for 3 weeks, Child psychotherapist for financial resources and transportation help and nursing for wound care.

## 2018-03-30 NOTE — Telephone Encounter (Signed)
Travis Cantrell.. can you contact is home health agency to determine how to order a hoyer lift.

## 2018-03-30 NOTE — Telephone Encounter (Signed)
Misty Stanley said she needs approval for PT 2 x a week for 3 weeks.  She would like to add Social work for financial resources and transportation help. She needs to add nursing for wound care.  Patient has a huge stage 2 on his right heel that must have occurred at the hospital. Message may be left on Stacey's voice mail. If she doesn't pick up.

## 2018-03-31 DIAGNOSIS — Z9181 History of falling: Secondary | ICD-10-CM | POA: Diagnosis not present

## 2018-03-31 DIAGNOSIS — M62421 Contracture of muscle, right upper arm: Secondary | ICD-10-CM | POA: Diagnosis not present

## 2018-03-31 DIAGNOSIS — Z87440 Personal history of urinary (tract) infections: Secondary | ICD-10-CM | POA: Diagnosis not present

## 2018-03-31 DIAGNOSIS — G500 Trigeminal neuralgia: Secondary | ICD-10-CM | POA: Diagnosis not present

## 2018-03-31 DIAGNOSIS — Z466 Encounter for fitting and adjustment of urinary device: Secondary | ICD-10-CM | POA: Diagnosis not present

## 2018-03-31 DIAGNOSIS — N319 Neuromuscular dysfunction of bladder, unspecified: Secondary | ICD-10-CM | POA: Diagnosis not present

## 2018-03-31 DIAGNOSIS — G35 Multiple sclerosis: Secondary | ICD-10-CM | POA: Diagnosis not present

## 2018-03-31 DIAGNOSIS — L89611 Pressure ulcer of right heel, stage 1: Secondary | ICD-10-CM | POA: Diagnosis not present

## 2018-03-31 DIAGNOSIS — G800 Spastic quadriplegic cerebral palsy: Secondary | ICD-10-CM | POA: Diagnosis not present

## 2018-03-31 DIAGNOSIS — I10 Essential (primary) hypertension: Secondary | ICD-10-CM | POA: Diagnosis not present

## 2018-03-31 DIAGNOSIS — R7303 Prediabetes: Secondary | ICD-10-CM | POA: Diagnosis not present

## 2018-03-31 DIAGNOSIS — K5900 Constipation, unspecified: Secondary | ICD-10-CM | POA: Diagnosis not present

## 2018-03-31 DIAGNOSIS — Z993 Dependence on wheelchair: Secondary | ICD-10-CM | POA: Diagnosis not present

## 2018-04-02 ENCOUNTER — Telehealth: Payer: Self-pay | Admitting: *Deleted

## 2018-04-02 NOTE — Telephone Encounter (Signed)
I have placed order in Epic.  Dr. Ermalene Searing just needs to sign.

## 2018-04-02 NOTE — Telephone Encounter (Signed)
DME order for [employer] entered in [place].  Community message sent to [facility] advising them of order.

## 2018-04-02 NOTE — Addendum Note (Signed)
Addended by: Damita Lack on: 04/02/2018 11:03 AM   Modules accepted: Orders

## 2018-04-02 NOTE — Telephone Encounter (Signed)
Elease Hashimoto (nusre) with [facility] called stating that they are monitoring a wound on patient's right heel. Elease Hashimoto stated that they are applying neosporin and wrapping the wound. Elease Hashimoto wants to know if you want them to do anymore than that?  Elease Hashimoto is requesting verbal orders for nursing once a week for 4 weeks to monitor the wound.

## 2018-04-03 DIAGNOSIS — Z87440 Personal history of urinary (tract) infections: Secondary | ICD-10-CM | POA: Diagnosis not present

## 2018-04-03 DIAGNOSIS — N319 Neuromuscular dysfunction of bladder, unspecified: Secondary | ICD-10-CM | POA: Diagnosis not present

## 2018-04-03 DIAGNOSIS — L89611 Pressure ulcer of right heel, stage 1: Secondary | ICD-10-CM | POA: Diagnosis not present

## 2018-04-03 DIAGNOSIS — I10 Essential (primary) hypertension: Secondary | ICD-10-CM | POA: Diagnosis not present

## 2018-04-03 DIAGNOSIS — G35 Multiple sclerosis: Secondary | ICD-10-CM | POA: Diagnosis not present

## 2018-04-03 DIAGNOSIS — K5900 Constipation, unspecified: Secondary | ICD-10-CM | POA: Diagnosis not present

## 2018-04-03 DIAGNOSIS — R7303 Prediabetes: Secondary | ICD-10-CM | POA: Diagnosis not present

## 2018-04-03 DIAGNOSIS — Z466 Encounter for fitting and adjustment of urinary device: Secondary | ICD-10-CM | POA: Diagnosis not present

## 2018-04-03 DIAGNOSIS — Z9181 History of falling: Secondary | ICD-10-CM | POA: Diagnosis not present

## 2018-04-03 DIAGNOSIS — G500 Trigeminal neuralgia: Secondary | ICD-10-CM | POA: Diagnosis not present

## 2018-04-03 DIAGNOSIS — M62421 Contracture of muscle, right upper arm: Secondary | ICD-10-CM | POA: Diagnosis not present

## 2018-04-03 DIAGNOSIS — G800 Spastic quadriplegic cerebral palsy: Secondary | ICD-10-CM | POA: Diagnosis not present

## 2018-04-03 DIAGNOSIS — Z993 Dependence on wheelchair: Secondary | ICD-10-CM | POA: Diagnosis not present

## 2018-04-03 NOTE — Telephone Encounter (Signed)
Okay to given verbal orders as requested. Pt has upcoming appt for re-eval.

## 2018-04-03 NOTE — Telephone Encounter (Signed)
I cannot say without examining it... just continue that for now unless they have specific recomendations

## 2018-04-03 NOTE — Telephone Encounter (Signed)
They are wanting to also know if you want then to use/do anything more that neosporin and wrapping the wound.  Please advise before I can to give orders for nursing.

## 2018-04-04 DIAGNOSIS — R7303 Prediabetes: Secondary | ICD-10-CM | POA: Diagnosis not present

## 2018-04-04 DIAGNOSIS — Z466 Encounter for fitting and adjustment of urinary device: Secondary | ICD-10-CM | POA: Diagnosis not present

## 2018-04-04 DIAGNOSIS — K5900 Constipation, unspecified: Secondary | ICD-10-CM | POA: Diagnosis not present

## 2018-04-04 DIAGNOSIS — I10 Essential (primary) hypertension: Secondary | ICD-10-CM | POA: Diagnosis not present

## 2018-04-04 DIAGNOSIS — G35 Multiple sclerosis: Secondary | ICD-10-CM | POA: Diagnosis not present

## 2018-04-04 DIAGNOSIS — G500 Trigeminal neuralgia: Secondary | ICD-10-CM | POA: Diagnosis not present

## 2018-04-04 DIAGNOSIS — L89611 Pressure ulcer of right heel, stage 1: Secondary | ICD-10-CM | POA: Diagnosis not present

## 2018-04-04 DIAGNOSIS — Z87440 Personal history of urinary (tract) infections: Secondary | ICD-10-CM | POA: Diagnosis not present

## 2018-04-04 DIAGNOSIS — Z9181 History of falling: Secondary | ICD-10-CM | POA: Diagnosis not present

## 2018-04-04 DIAGNOSIS — M62421 Contracture of muscle, right upper arm: Secondary | ICD-10-CM | POA: Diagnosis not present

## 2018-04-04 DIAGNOSIS — N319 Neuromuscular dysfunction of bladder, unspecified: Secondary | ICD-10-CM | POA: Diagnosis not present

## 2018-04-04 DIAGNOSIS — Z993 Dependence on wheelchair: Secondary | ICD-10-CM | POA: Diagnosis not present

## 2018-04-04 DIAGNOSIS — G800 Spastic quadriplegic cerebral palsy: Secondary | ICD-10-CM | POA: Diagnosis not present

## 2018-04-04 NOTE — Telephone Encounter (Signed)
Left message for Travis Cantrell with [facility] giving verbal orders for nursing once a week for 4 week for wound care.  I also advised that since Dr. Ermalene Searing has not examined the wound she can't really make any recommendations so I instructed her to continue as she is doing the neosporin and wrapping but if she has any further recommendations, to call us back.

## 2018-04-04 NOTE — Telephone Encounter (Signed)
Travis Cantrell with [facility] left v/m requesting cb about orders.

## 2018-04-05 ENCOUNTER — Telehealth: Payer: Self-pay

## 2018-04-05 DIAGNOSIS — Z993 Dependence on wheelchair: Secondary | ICD-10-CM | POA: Diagnosis not present

## 2018-04-05 DIAGNOSIS — I10 Essential (primary) hypertension: Secondary | ICD-10-CM | POA: Diagnosis not present

## 2018-04-05 DIAGNOSIS — M62421 Contracture of muscle, right upper arm: Secondary | ICD-10-CM | POA: Diagnosis not present

## 2018-04-05 DIAGNOSIS — L89611 Pressure ulcer of right heel, stage 1: Secondary | ICD-10-CM | POA: Diagnosis not present

## 2018-04-05 DIAGNOSIS — G500 Trigeminal neuralgia: Secondary | ICD-10-CM | POA: Diagnosis not present

## 2018-04-05 DIAGNOSIS — Z466 Encounter for fitting and adjustment of urinary device: Secondary | ICD-10-CM | POA: Diagnosis not present

## 2018-04-05 DIAGNOSIS — G35 Multiple sclerosis: Secondary | ICD-10-CM | POA: Diagnosis not present

## 2018-04-05 DIAGNOSIS — K5900 Constipation, unspecified: Secondary | ICD-10-CM | POA: Diagnosis not present

## 2018-04-05 DIAGNOSIS — Z87440 Personal history of urinary (tract) infections: Secondary | ICD-10-CM | POA: Diagnosis not present

## 2018-04-05 DIAGNOSIS — R7303 Prediabetes: Secondary | ICD-10-CM | POA: Diagnosis not present

## 2018-04-05 DIAGNOSIS — N319 Neuromuscular dysfunction of bladder, unspecified: Secondary | ICD-10-CM | POA: Diagnosis not present

## 2018-04-05 DIAGNOSIS — Z9181 History of falling: Secondary | ICD-10-CM | POA: Diagnosis not present

## 2018-04-05 DIAGNOSIS — G800 Spastic quadriplegic cerebral palsy: Secondary | ICD-10-CM | POA: Diagnosis not present

## 2018-04-05 NOTE — Telephone Encounter (Signed)
Esther OT with [facility] left v/m requesting verbal orders for [facility] OT 1 x a wk for 1 wk and 2 x a wk for 3 wks. Darral Dash also request order for lt elbow splint and lt hand/wrist splint for emerging contracture.Please advise.

## 2018-04-06 ENCOUNTER — Inpatient Hospital Stay: Payer: Medicare Other | Admitting: Family Medicine

## 2018-04-06 DIAGNOSIS — M62421 Contracture of muscle, right upper arm: Secondary | ICD-10-CM | POA: Diagnosis not present

## 2018-04-06 DIAGNOSIS — Z9181 History of falling: Secondary | ICD-10-CM | POA: Diagnosis not present

## 2018-04-06 DIAGNOSIS — G35 Multiple sclerosis: Secondary | ICD-10-CM | POA: Diagnosis not present

## 2018-04-06 DIAGNOSIS — K5900 Constipation, unspecified: Secondary | ICD-10-CM | POA: Diagnosis not present

## 2018-04-06 DIAGNOSIS — G800 Spastic quadriplegic cerebral palsy: Secondary | ICD-10-CM | POA: Diagnosis not present

## 2018-04-06 DIAGNOSIS — Z466 Encounter for fitting and adjustment of urinary device: Secondary | ICD-10-CM | POA: Diagnosis not present

## 2018-04-06 DIAGNOSIS — I10 Essential (primary) hypertension: Secondary | ICD-10-CM | POA: Diagnosis not present

## 2018-04-06 DIAGNOSIS — Z993 Dependence on wheelchair: Secondary | ICD-10-CM | POA: Diagnosis not present

## 2018-04-06 DIAGNOSIS — G500 Trigeminal neuralgia: Secondary | ICD-10-CM | POA: Diagnosis not present

## 2018-04-06 DIAGNOSIS — Z87440 Personal history of urinary (tract) infections: Secondary | ICD-10-CM | POA: Diagnosis not present

## 2018-04-06 DIAGNOSIS — N319 Neuromuscular dysfunction of bladder, unspecified: Secondary | ICD-10-CM | POA: Diagnosis not present

## 2018-04-06 DIAGNOSIS — L89611 Pressure ulcer of right heel, stage 1: Secondary | ICD-10-CM | POA: Diagnosis not present

## 2018-04-06 DIAGNOSIS — R7303 Prediabetes: Secondary | ICD-10-CM | POA: Diagnosis not present

## 2018-04-06 NOTE — Telephone Encounter (Signed)
Esther OT with [facility] left v/m requesting cb today with verbal orders because Darral Dash has appt to see pt on 04/09/18 at 8 AM.

## 2018-04-06 NOTE — Telephone Encounter (Signed)
Esther advised. Darral Dash stated she will get a fax to Dr. Ermalene Searing to fill out for order for slint. Awaiting fax.

## 2018-04-06 NOTE — Telephone Encounter (Signed)
Okay to give verbal orders as requested. Please send message back to me so I can set up order for splint

## 2018-04-09 ENCOUNTER — Telehealth: Payer: Self-pay | Admitting: Family Medicine

## 2018-04-09 DIAGNOSIS — Z466 Encounter for fitting and adjustment of urinary device: Secondary | ICD-10-CM | POA: Diagnosis not present

## 2018-04-09 DIAGNOSIS — G800 Spastic quadriplegic cerebral palsy: Secondary | ICD-10-CM | POA: Diagnosis not present

## 2018-04-09 DIAGNOSIS — Z87440 Personal history of urinary (tract) infections: Secondary | ICD-10-CM | POA: Diagnosis not present

## 2018-04-09 DIAGNOSIS — I10 Essential (primary) hypertension: Secondary | ICD-10-CM | POA: Diagnosis not present

## 2018-04-09 DIAGNOSIS — G500 Trigeminal neuralgia: Secondary | ICD-10-CM | POA: Diagnosis not present

## 2018-04-09 DIAGNOSIS — N319 Neuromuscular dysfunction of bladder, unspecified: Secondary | ICD-10-CM | POA: Diagnosis not present

## 2018-04-09 DIAGNOSIS — Z993 Dependence on wheelchair: Secondary | ICD-10-CM | POA: Diagnosis not present

## 2018-04-09 DIAGNOSIS — L89611 Pressure ulcer of right heel, stage 1: Secondary | ICD-10-CM | POA: Diagnosis not present

## 2018-04-09 DIAGNOSIS — M62421 Contracture of muscle, right upper arm: Secondary | ICD-10-CM | POA: Diagnosis not present

## 2018-04-09 DIAGNOSIS — Z9181 History of falling: Secondary | ICD-10-CM | POA: Diagnosis not present

## 2018-04-09 DIAGNOSIS — R7303 Prediabetes: Secondary | ICD-10-CM | POA: Diagnosis not present

## 2018-04-09 DIAGNOSIS — K5900 Constipation, unspecified: Secondary | ICD-10-CM | POA: Diagnosis not present

## 2018-04-09 DIAGNOSIS — G35 Multiple sclerosis: Secondary | ICD-10-CM | POA: Diagnosis not present

## 2018-04-09 NOTE — Telephone Encounter (Signed)
Please fax prescription to [phone] for left elbow,hand/wrist splint

## 2018-04-09 NOTE — Telephone Encounter (Signed)
chelsea called to cancel pt appointment due to low immune system. Pt is doing fine now she has know concerns will call back to r/s   Best number [phone]

## 2018-04-09 NOTE — Telephone Encounter (Signed)
Noted and agree. 

## 2018-04-10 NOTE — Telephone Encounter (Signed)
I se no form faxed.. wrote order on prescription. Let me know if something different needs to be done.

## 2018-04-10 NOTE — Telephone Encounter (Signed)
Order for splint faxed to [facility] at [phone].

## 2018-04-11 DIAGNOSIS — R7303 Prediabetes: Secondary | ICD-10-CM | POA: Diagnosis not present

## 2018-04-11 DIAGNOSIS — K5900 Constipation, unspecified: Secondary | ICD-10-CM | POA: Diagnosis not present

## 2018-04-11 DIAGNOSIS — G35 Multiple sclerosis: Secondary | ICD-10-CM | POA: Diagnosis not present

## 2018-04-11 DIAGNOSIS — Z87440 Personal history of urinary (tract) infections: Secondary | ICD-10-CM | POA: Diagnosis not present

## 2018-04-11 DIAGNOSIS — Z9181 History of falling: Secondary | ICD-10-CM | POA: Diagnosis not present

## 2018-04-11 DIAGNOSIS — G800 Spastic quadriplegic cerebral palsy: Secondary | ICD-10-CM | POA: Diagnosis not present

## 2018-04-11 DIAGNOSIS — L89611 Pressure ulcer of right heel, stage 1: Secondary | ICD-10-CM | POA: Diagnosis not present

## 2018-04-11 DIAGNOSIS — Z466 Encounter for fitting and adjustment of urinary device: Secondary | ICD-10-CM | POA: Diagnosis not present

## 2018-04-11 DIAGNOSIS — N319 Neuromuscular dysfunction of bladder, unspecified: Secondary | ICD-10-CM | POA: Diagnosis not present

## 2018-04-11 DIAGNOSIS — G500 Trigeminal neuralgia: Secondary | ICD-10-CM | POA: Diagnosis not present

## 2018-04-11 DIAGNOSIS — M62421 Contracture of muscle, right upper arm: Secondary | ICD-10-CM | POA: Diagnosis not present

## 2018-04-11 DIAGNOSIS — I10 Essential (primary) hypertension: Secondary | ICD-10-CM | POA: Diagnosis not present

## 2018-04-11 DIAGNOSIS — Z993 Dependence on wheelchair: Secondary | ICD-10-CM | POA: Diagnosis not present

## 2018-04-12 ENCOUNTER — Inpatient Hospital Stay: Payer: Medicare Other | Admitting: Family Medicine

## 2018-04-12 DIAGNOSIS — Z87440 Personal history of urinary (tract) infections: Secondary | ICD-10-CM | POA: Diagnosis not present

## 2018-04-12 DIAGNOSIS — N319 Neuromuscular dysfunction of bladder, unspecified: Secondary | ICD-10-CM | POA: Diagnosis not present

## 2018-04-12 DIAGNOSIS — L89611 Pressure ulcer of right heel, stage 1: Secondary | ICD-10-CM | POA: Diagnosis not present

## 2018-04-12 DIAGNOSIS — I10 Essential (primary) hypertension: Secondary | ICD-10-CM | POA: Diagnosis not present

## 2018-04-12 DIAGNOSIS — R7303 Prediabetes: Secondary | ICD-10-CM | POA: Diagnosis not present

## 2018-04-12 DIAGNOSIS — Z993 Dependence on wheelchair: Secondary | ICD-10-CM | POA: Diagnosis not present

## 2018-04-12 DIAGNOSIS — G800 Spastic quadriplegic cerebral palsy: Secondary | ICD-10-CM | POA: Diagnosis not present

## 2018-04-12 DIAGNOSIS — K5900 Constipation, unspecified: Secondary | ICD-10-CM | POA: Diagnosis not present

## 2018-04-12 DIAGNOSIS — G35 Multiple sclerosis: Secondary | ICD-10-CM | POA: Diagnosis not present

## 2018-04-12 DIAGNOSIS — M62421 Contracture of muscle, right upper arm: Secondary | ICD-10-CM | POA: Diagnosis not present

## 2018-04-12 DIAGNOSIS — G500 Trigeminal neuralgia: Secondary | ICD-10-CM | POA: Diagnosis not present

## 2018-04-12 DIAGNOSIS — Z9181 History of falling: Secondary | ICD-10-CM | POA: Diagnosis not present

## 2018-04-12 DIAGNOSIS — Z466 Encounter for fitting and adjustment of urinary device: Secondary | ICD-10-CM | POA: Diagnosis not present

## 2018-04-13 ENCOUNTER — Other Ambulatory Visit: Payer: Self-pay | Admitting: Family Medicine

## 2018-04-13 DIAGNOSIS — N319 Neuromuscular dysfunction of bladder, unspecified: Secondary | ICD-10-CM | POA: Diagnosis not present

## 2018-04-13 DIAGNOSIS — Z993 Dependence on wheelchair: Secondary | ICD-10-CM | POA: Diagnosis not present

## 2018-04-13 DIAGNOSIS — M62421 Contracture of muscle, right upper arm: Secondary | ICD-10-CM | POA: Diagnosis not present

## 2018-04-13 DIAGNOSIS — K5900 Constipation, unspecified: Secondary | ICD-10-CM | POA: Diagnosis not present

## 2018-04-13 DIAGNOSIS — R7303 Prediabetes: Secondary | ICD-10-CM | POA: Diagnosis not present

## 2018-04-13 DIAGNOSIS — Z87440 Personal history of urinary (tract) infections: Secondary | ICD-10-CM | POA: Diagnosis not present

## 2018-04-13 DIAGNOSIS — G500 Trigeminal neuralgia: Secondary | ICD-10-CM | POA: Diagnosis not present

## 2018-04-13 DIAGNOSIS — G35 Multiple sclerosis: Secondary | ICD-10-CM | POA: Diagnosis not present

## 2018-04-13 DIAGNOSIS — L89611 Pressure ulcer of right heel, stage 1: Secondary | ICD-10-CM | POA: Diagnosis not present

## 2018-04-13 DIAGNOSIS — Z9181 History of falling: Secondary | ICD-10-CM | POA: Diagnosis not present

## 2018-04-13 DIAGNOSIS — G800 Spastic quadriplegic cerebral palsy: Secondary | ICD-10-CM | POA: Diagnosis not present

## 2018-04-13 DIAGNOSIS — I10 Essential (primary) hypertension: Secondary | ICD-10-CM | POA: Diagnosis not present

## 2018-04-13 DIAGNOSIS — Z466 Encounter for fitting and adjustment of urinary device: Secondary | ICD-10-CM | POA: Diagnosis not present

## 2018-04-14 DIAGNOSIS — L89309 Pressure ulcer of unspecified buttock, unspecified stage: Secondary | ICD-10-CM | POA: Diagnosis not present

## 2018-04-14 DIAGNOSIS — G35 Multiple sclerosis: Secondary | ICD-10-CM | POA: Diagnosis not present

## 2018-04-14 DIAGNOSIS — L8992 Pressure ulcer of unspecified site, stage 2: Secondary | ICD-10-CM | POA: Diagnosis not present

## 2018-04-16 DIAGNOSIS — Z993 Dependence on wheelchair: Secondary | ICD-10-CM | POA: Diagnosis not present

## 2018-04-16 DIAGNOSIS — G35 Multiple sclerosis: Secondary | ICD-10-CM | POA: Diagnosis not present

## 2018-04-16 DIAGNOSIS — G800 Spastic quadriplegic cerebral palsy: Secondary | ICD-10-CM | POA: Diagnosis not present

## 2018-04-16 DIAGNOSIS — N319 Neuromuscular dysfunction of bladder, unspecified: Secondary | ICD-10-CM | POA: Diagnosis not present

## 2018-04-16 DIAGNOSIS — I10 Essential (primary) hypertension: Secondary | ICD-10-CM | POA: Diagnosis not present

## 2018-04-16 DIAGNOSIS — R7303 Prediabetes: Secondary | ICD-10-CM | POA: Diagnosis not present

## 2018-04-16 DIAGNOSIS — Z9181 History of falling: Secondary | ICD-10-CM | POA: Diagnosis not present

## 2018-04-16 DIAGNOSIS — M62421 Contracture of muscle, right upper arm: Secondary | ICD-10-CM | POA: Diagnosis not present

## 2018-04-16 DIAGNOSIS — Z87440 Personal history of urinary (tract) infections: Secondary | ICD-10-CM | POA: Diagnosis not present

## 2018-04-16 DIAGNOSIS — G500 Trigeminal neuralgia: Secondary | ICD-10-CM | POA: Diagnosis not present

## 2018-04-16 DIAGNOSIS — Z466 Encounter for fitting and adjustment of urinary device: Secondary | ICD-10-CM | POA: Diagnosis not present

## 2018-04-16 DIAGNOSIS — L89611 Pressure ulcer of right heel, stage 1: Secondary | ICD-10-CM | POA: Diagnosis not present

## 2018-04-16 DIAGNOSIS — K5900 Constipation, unspecified: Secondary | ICD-10-CM | POA: Diagnosis not present

## 2018-04-17 ENCOUNTER — Other Ambulatory Visit: Payer: Self-pay | Admitting: Family Medicine

## 2018-04-17 DIAGNOSIS — Z87440 Personal history of urinary (tract) infections: Secondary | ICD-10-CM | POA: Diagnosis not present

## 2018-04-17 DIAGNOSIS — G35 Multiple sclerosis: Secondary | ICD-10-CM | POA: Diagnosis not present

## 2018-04-17 DIAGNOSIS — N319 Neuromuscular dysfunction of bladder, unspecified: Secondary | ICD-10-CM | POA: Diagnosis not present

## 2018-04-17 DIAGNOSIS — G500 Trigeminal neuralgia: Secondary | ICD-10-CM | POA: Diagnosis not present

## 2018-04-17 DIAGNOSIS — L89611 Pressure ulcer of right heel, stage 1: Secondary | ICD-10-CM | POA: Diagnosis not present

## 2018-04-17 DIAGNOSIS — R7303 Prediabetes: Secondary | ICD-10-CM | POA: Diagnosis not present

## 2018-04-17 DIAGNOSIS — Z993 Dependence on wheelchair: Secondary | ICD-10-CM | POA: Diagnosis not present

## 2018-04-17 DIAGNOSIS — Z9181 History of falling: Secondary | ICD-10-CM | POA: Diagnosis not present

## 2018-04-17 DIAGNOSIS — I10 Essential (primary) hypertension: Secondary | ICD-10-CM | POA: Diagnosis not present

## 2018-04-17 DIAGNOSIS — K5900 Constipation, unspecified: Secondary | ICD-10-CM | POA: Diagnosis not present

## 2018-04-17 DIAGNOSIS — Z466 Encounter for fitting and adjustment of urinary device: Secondary | ICD-10-CM | POA: Diagnosis not present

## 2018-04-17 DIAGNOSIS — G800 Spastic quadriplegic cerebral palsy: Secondary | ICD-10-CM | POA: Diagnosis not present

## 2018-04-17 DIAGNOSIS — M62421 Contracture of muscle, right upper arm: Secondary | ICD-10-CM | POA: Diagnosis not present

## 2018-04-17 NOTE — Telephone Encounter (Signed)
Last office visit 12/26/2017 for hospital follow up.  Last refilled 03/28/2018.  No future appointments with PCP.

## 2018-04-18 DIAGNOSIS — R7303 Prediabetes: Secondary | ICD-10-CM | POA: Diagnosis not present

## 2018-04-18 DIAGNOSIS — L89611 Pressure ulcer of right heel, stage 1: Secondary | ICD-10-CM | POA: Diagnosis not present

## 2018-04-18 DIAGNOSIS — K5900 Constipation, unspecified: Secondary | ICD-10-CM | POA: Diagnosis not present

## 2018-04-18 DIAGNOSIS — M62421 Contracture of muscle, right upper arm: Secondary | ICD-10-CM | POA: Diagnosis not present

## 2018-04-18 DIAGNOSIS — G500 Trigeminal neuralgia: Secondary | ICD-10-CM | POA: Diagnosis not present

## 2018-04-18 DIAGNOSIS — Z466 Encounter for fitting and adjustment of urinary device: Secondary | ICD-10-CM | POA: Diagnosis not present

## 2018-04-18 DIAGNOSIS — G800 Spastic quadriplegic cerebral palsy: Secondary | ICD-10-CM | POA: Diagnosis not present

## 2018-04-18 DIAGNOSIS — G35 Multiple sclerosis: Secondary | ICD-10-CM | POA: Diagnosis not present

## 2018-04-18 DIAGNOSIS — Z9181 History of falling: Secondary | ICD-10-CM | POA: Diagnosis not present

## 2018-04-18 DIAGNOSIS — Z87440 Personal history of urinary (tract) infections: Secondary | ICD-10-CM | POA: Diagnosis not present

## 2018-04-18 DIAGNOSIS — N319 Neuromuscular dysfunction of bladder, unspecified: Secondary | ICD-10-CM | POA: Diagnosis not present

## 2018-04-18 DIAGNOSIS — Z993 Dependence on wheelchair: Secondary | ICD-10-CM | POA: Diagnosis not present

## 2018-04-18 DIAGNOSIS — I10 Essential (primary) hypertension: Secondary | ICD-10-CM | POA: Diagnosis not present

## 2018-04-19 DIAGNOSIS — M62421 Contracture of muscle, right upper arm: Secondary | ICD-10-CM | POA: Diagnosis not present

## 2018-04-19 DIAGNOSIS — N319 Neuromuscular dysfunction of bladder, unspecified: Secondary | ICD-10-CM | POA: Diagnosis not present

## 2018-04-19 DIAGNOSIS — K5900 Constipation, unspecified: Secondary | ICD-10-CM | POA: Diagnosis not present

## 2018-04-19 DIAGNOSIS — G35 Multiple sclerosis: Secondary | ICD-10-CM | POA: Diagnosis not present

## 2018-04-19 DIAGNOSIS — R7303 Prediabetes: Secondary | ICD-10-CM | POA: Diagnosis not present

## 2018-04-19 DIAGNOSIS — Z87440 Personal history of urinary (tract) infections: Secondary | ICD-10-CM | POA: Diagnosis not present

## 2018-04-19 DIAGNOSIS — L89611 Pressure ulcer of right heel, stage 1: Secondary | ICD-10-CM | POA: Diagnosis not present

## 2018-04-19 DIAGNOSIS — I10 Essential (primary) hypertension: Secondary | ICD-10-CM | POA: Diagnosis not present

## 2018-04-19 DIAGNOSIS — Z9181 History of falling: Secondary | ICD-10-CM | POA: Diagnosis not present

## 2018-04-19 DIAGNOSIS — Z466 Encounter for fitting and adjustment of urinary device: Secondary | ICD-10-CM | POA: Diagnosis not present

## 2018-04-19 DIAGNOSIS — Z993 Dependence on wheelchair: Secondary | ICD-10-CM | POA: Diagnosis not present

## 2018-04-19 DIAGNOSIS — G500 Trigeminal neuralgia: Secondary | ICD-10-CM | POA: Diagnosis not present

## 2018-04-19 DIAGNOSIS — G800 Spastic quadriplegic cerebral palsy: Secondary | ICD-10-CM | POA: Diagnosis not present

## 2018-04-20 DIAGNOSIS — Z993 Dependence on wheelchair: Secondary | ICD-10-CM | POA: Diagnosis not present

## 2018-04-20 DIAGNOSIS — L89611 Pressure ulcer of right heel, stage 1: Secondary | ICD-10-CM | POA: Diagnosis not present

## 2018-04-20 DIAGNOSIS — G500 Trigeminal neuralgia: Secondary | ICD-10-CM | POA: Diagnosis not present

## 2018-04-20 DIAGNOSIS — G35 Multiple sclerosis: Secondary | ICD-10-CM | POA: Diagnosis not present

## 2018-04-20 DIAGNOSIS — I10 Essential (primary) hypertension: Secondary | ICD-10-CM | POA: Diagnosis not present

## 2018-04-20 DIAGNOSIS — Z9181 History of falling: Secondary | ICD-10-CM | POA: Diagnosis not present

## 2018-04-20 DIAGNOSIS — Z87440 Personal history of urinary (tract) infections: Secondary | ICD-10-CM | POA: Diagnosis not present

## 2018-04-20 DIAGNOSIS — N319 Neuromuscular dysfunction of bladder, unspecified: Secondary | ICD-10-CM | POA: Diagnosis not present

## 2018-04-20 DIAGNOSIS — Z466 Encounter for fitting and adjustment of urinary device: Secondary | ICD-10-CM | POA: Diagnosis not present

## 2018-04-20 DIAGNOSIS — K5900 Constipation, unspecified: Secondary | ICD-10-CM | POA: Diagnosis not present

## 2018-04-20 DIAGNOSIS — G800 Spastic quadriplegic cerebral palsy: Secondary | ICD-10-CM | POA: Diagnosis not present

## 2018-04-20 DIAGNOSIS — R7303 Prediabetes: Secondary | ICD-10-CM | POA: Diagnosis not present

## 2018-04-20 DIAGNOSIS — M62421 Contracture of muscle, right upper arm: Secondary | ICD-10-CM | POA: Diagnosis not present

## 2018-04-23 DIAGNOSIS — G35 Multiple sclerosis: Secondary | ICD-10-CM | POA: Diagnosis not present

## 2018-04-23 DIAGNOSIS — I10 Essential (primary) hypertension: Secondary | ICD-10-CM | POA: Diagnosis not present

## 2018-04-23 DIAGNOSIS — R7303 Prediabetes: Secondary | ICD-10-CM | POA: Diagnosis not present

## 2018-04-23 DIAGNOSIS — Z466 Encounter for fitting and adjustment of urinary device: Secondary | ICD-10-CM | POA: Diagnosis not present

## 2018-04-23 DIAGNOSIS — N319 Neuromuscular dysfunction of bladder, unspecified: Secondary | ICD-10-CM | POA: Diagnosis not present

## 2018-04-23 DIAGNOSIS — G500 Trigeminal neuralgia: Secondary | ICD-10-CM | POA: Diagnosis not present

## 2018-04-23 DIAGNOSIS — Z87440 Personal history of urinary (tract) infections: Secondary | ICD-10-CM | POA: Diagnosis not present

## 2018-04-23 DIAGNOSIS — M62421 Contracture of muscle, right upper arm: Secondary | ICD-10-CM | POA: Diagnosis not present

## 2018-04-23 DIAGNOSIS — Z9181 History of falling: Secondary | ICD-10-CM | POA: Diagnosis not present

## 2018-04-23 DIAGNOSIS — L89611 Pressure ulcer of right heel, stage 1: Secondary | ICD-10-CM | POA: Diagnosis not present

## 2018-04-23 DIAGNOSIS — G800 Spastic quadriplegic cerebral palsy: Secondary | ICD-10-CM | POA: Diagnosis not present

## 2018-04-23 DIAGNOSIS — K5900 Constipation, unspecified: Secondary | ICD-10-CM | POA: Diagnosis not present

## 2018-04-23 DIAGNOSIS — Z993 Dependence on wheelchair: Secondary | ICD-10-CM | POA: Diagnosis not present

## 2018-04-25 DIAGNOSIS — I10 Essential (primary) hypertension: Secondary | ICD-10-CM | POA: Diagnosis not present

## 2018-04-25 DIAGNOSIS — G500 Trigeminal neuralgia: Secondary | ICD-10-CM | POA: Diagnosis not present

## 2018-04-25 DIAGNOSIS — N319 Neuromuscular dysfunction of bladder, unspecified: Secondary | ICD-10-CM | POA: Diagnosis not present

## 2018-04-25 DIAGNOSIS — Z87440 Personal history of urinary (tract) infections: Secondary | ICD-10-CM | POA: Diagnosis not present

## 2018-04-25 DIAGNOSIS — Z993 Dependence on wheelchair: Secondary | ICD-10-CM | POA: Diagnosis not present

## 2018-04-25 DIAGNOSIS — G35 Multiple sclerosis: Secondary | ICD-10-CM | POA: Diagnosis not present

## 2018-04-25 DIAGNOSIS — K5900 Constipation, unspecified: Secondary | ICD-10-CM | POA: Diagnosis not present

## 2018-04-25 DIAGNOSIS — M62421 Contracture of muscle, right upper arm: Secondary | ICD-10-CM | POA: Diagnosis not present

## 2018-04-25 DIAGNOSIS — L89611 Pressure ulcer of right heel, stage 1: Secondary | ICD-10-CM | POA: Diagnosis not present

## 2018-04-25 DIAGNOSIS — Z9181 History of falling: Secondary | ICD-10-CM | POA: Diagnosis not present

## 2018-04-25 DIAGNOSIS — G800 Spastic quadriplegic cerebral palsy: Secondary | ICD-10-CM | POA: Diagnosis not present

## 2018-04-25 DIAGNOSIS — Z466 Encounter for fitting and adjustment of urinary device: Secondary | ICD-10-CM | POA: Diagnosis not present

## 2018-04-25 DIAGNOSIS — R7303 Prediabetes: Secondary | ICD-10-CM | POA: Diagnosis not present

## 2018-05-10 ENCOUNTER — Telehealth: Payer: Self-pay | Admitting: Family Medicine

## 2018-05-10 NOTE — Telephone Encounter (Signed)
Last office visit 12/26/2017 for Hospital follow up.  Last refilled 04/17/2018 for #60 with no refills.  No future appointments.

## 2018-05-10 NOTE — Telephone Encounter (Signed)
Refilled med, but please call pt.. was in hospital in 02/2018.. did not see for hospital follow up. Please schedule virtual visit if able for hospital follow up.

## 2018-05-11 NOTE — Telephone Encounter (Signed)
Today at 5 PM? Tues at 5 PM? Last resort Saturday 2PM

## 2018-05-11 NOTE — Telephone Encounter (Signed)
Dr Ermalene Searing I spoke with [facility] pt step daughter.  She stated pt doesn't have virtual capability. She has smart phone and could help pt with a doxyme appointment but it would have to be after 5 or a Saturday. Ok to schedule??  If so what day would work for you

## 2018-05-11 NOTE — Telephone Encounter (Signed)
chelsea made Travis Cantrell's appointment for Tuesday.  She stated it maybe 5:10 before she gets to his home.  She will try her best to be there at 5

## 2018-05-13 ENCOUNTER — Other Ambulatory Visit: Payer: Self-pay | Admitting: Family Medicine

## 2018-05-14 NOTE — Telephone Encounter (Signed)
Last office visit 12/26/2017 for Hospital follow up.  Last refilled 04/17/2018 for #90 with no refills.  Doxy Me appointment scheduled for 05/15/2018.

## 2018-05-15 ENCOUNTER — Ambulatory Visit: Payer: Medicare Other | Admitting: Family Medicine

## 2018-05-15 DIAGNOSIS — L8992 Pressure ulcer of unspecified site, stage 2: Secondary | ICD-10-CM | POA: Diagnosis not present

## 2018-05-15 DIAGNOSIS — G35 Multiple sclerosis: Secondary | ICD-10-CM | POA: Diagnosis not present

## 2018-05-15 DIAGNOSIS — L89309 Pressure ulcer of unspecified buttock, unspecified stage: Secondary | ICD-10-CM | POA: Diagnosis not present

## 2018-05-22 ENCOUNTER — Telehealth: Payer: Self-pay

## 2018-05-22 NOTE — Telephone Encounter (Signed)
Jamie left v/m; EMTs are there; [name] gave phone to Renee Ramus an EMT with [facility]; Chris left v/m that the catheter tube that was inserted was too large, pt  bleeding a small amt now. Pt is in no pain. Wants to try to keep pt out of ED and can a smaller cath be sent to [facility] medical. Request cb to [facility].

## 2018-05-22 NOTE — Telephone Encounter (Signed)
Agree.. ideally want to keep pt out of ER.  I can send in rx for catheter if I am told size needed and specific info is given for me as I do not usually order these.  Also need to document.. no fever, no flu like symptoms.

## 2018-05-22 NOTE — Telephone Encounter (Signed)
Renee Ramus paramedic with [employer] left v/m that he is at the home of pt and pt is not actively bleeding now and wants to keep pt out of ED if possible. Thayer Ohm wants to  Try to figure out a solution with possible call in of smaller catheter. Chris request cb.

## 2018-05-22 NOTE — Telephone Encounter (Addendum)
I spoke with [name].  He is no longer at [name]'s home.  I called Travis Cantrell (daughter) who is a Engineer, civil (consulting) and on his DPR  She had no idea any of this was going on.  She will call Travis Cantrell (caregiver).  We had ordered catheters from [facility] back in January.  So apparently the catheter supplies were never received from [facility] so Travis Cantrell went to [employer] and pick up in and out cath kits and was trying to cath Travis Cantrell with the hard tubing from that.  He does not have and Vinyl Foley's at home.  Travis Cantrell is asking that we fax order to [facility] so she can pick up today until she can call [facility] to find out what is going on with them and Travis Cantrell's order.

## 2018-05-22 NOTE — Telephone Encounter (Signed)
Catheter order faxed to [facility] at [phone].

## 2018-05-22 NOTE — Telephone Encounter (Signed)
Blain Pais (Do not see signed DPR for [name]) left v/m that pts new catheters that were sent by [facility] medical supply are hard plastic and Jamie inserted the catheter and pt began to bleed. [name] removed catheter and wants to know what to do. I spoke with [name] and pt is still lightly bleeding still. A lot of urine came out and while [name] was inserting cath [facility] felt something and then pt began to bleed. Pt is not in any pain.[name] has already called the ambulance for EMTs to come out; [name] said "I an freaking out". [name] said the last cath to come out was hard plastic and the size was different; pt usually gets a catheter size 18/30 but the hard plastic cath is 16/40. [name] is going to have to move a vehicle for the ambulance to get in. [name] will cb if needed and [name] wants to know if a different cath can be sent to [facility].

## 2018-05-30 ENCOUNTER — Other Ambulatory Visit: Payer: Self-pay

## 2018-05-30 ENCOUNTER — Emergency Department
Admission: EM | Admit: 2018-05-30 | Discharge: 2018-05-30 | Disposition: A | Discharge status: Home / Self Care | Payer: Medicare Other | Attending: Emergency Medicine | Admitting: Emergency Medicine

## 2018-05-30 DIAGNOSIS — Z79899 Other long term (current) drug therapy: Secondary | ICD-10-CM | POA: Diagnosis not present

## 2018-05-30 DIAGNOSIS — Z87891 Personal history of nicotine dependence: Secondary | ICD-10-CM | POA: Insufficient documentation

## 2018-05-30 DIAGNOSIS — R369 Urethral discharge, unspecified: Secondary | ICD-10-CM | POA: Diagnosis present

## 2018-05-30 DIAGNOSIS — R58 Hemorrhage, not elsewhere classified: Secondary | ICD-10-CM | POA: Diagnosis not present

## 2018-05-30 DIAGNOSIS — Z7401 Bed confinement status: Secondary | ICD-10-CM | POA: Diagnosis not present

## 2018-05-30 DIAGNOSIS — Y69 Unspecified misadventure during surgical and medical care: Secondary | ICD-10-CM | POA: Insufficient documentation

## 2018-05-30 DIAGNOSIS — I10 Essential (primary) hypertension: Secondary | ICD-10-CM | POA: Insufficient documentation

## 2018-05-30 DIAGNOSIS — T83098A Other mechanical complication of other indwelling urethral catheter, initial encounter: Secondary | ICD-10-CM | POA: Diagnosis not present

## 2018-05-30 DIAGNOSIS — T839XXA Unspecified complication of genitourinary prosthetic device, implant and graft, initial encounter: Secondary | ICD-10-CM | POA: Diagnosis not present

## 2018-05-30 DIAGNOSIS — M2550 Pain in unspecified joint: Secondary | ICD-10-CM | POA: Diagnosis not present

## 2018-05-30 DIAGNOSIS — R5381 Other malaise: Secondary | ICD-10-CM | POA: Diagnosis not present
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
## 2018-05-30 NOTE — ED Triage Notes (Signed)
PT to ED via EMS from home for bleeding from penis. PT had new foley inserted 2 days ago. No pain, blood does not appear to be in urine.

## 2018-05-30 NOTE — ED Notes (Signed)
Pt unable to sign for discharge papers. Pt verbalized understanding of D/C instructions and follow up care. No further questions at this time. Pt in NAD at time of D/C.

## 2018-05-30 NOTE — Discharge Instructions (Addendum)
Please seek medical attention for any high fevers, chest pain, shortness of breath, change in behavior, persistent vomiting, bloody stool or any other new or concerning symptoms.  

## 2018-06-01 ENCOUNTER — Other Ambulatory Visit: Payer: Self-pay | Admitting: Family Medicine

## 2018-06-01 NOTE — Telephone Encounter (Signed)
Last office visit 12/26/2017 for hospital follow up.. Last refilled Diazepam 05/10/2018 for #60 with no refills.  Ibuprofen 05/14/2018 for #90 with no refills.  No future appointments with PCP.

## 2018-06-06 DIAGNOSIS — L89152 Pressure ulcer of sacral region, stage 2: Secondary | ICD-10-CM | POA: Diagnosis not present

## 2018-06-14 DIAGNOSIS — L89309 Pressure ulcer of unspecified buttock, unspecified stage: Secondary | ICD-10-CM | POA: Diagnosis not present

## 2018-06-14 DIAGNOSIS — G35 Multiple sclerosis: Secondary | ICD-10-CM | POA: Diagnosis not present

## 2018-06-14 DIAGNOSIS — L8992 Pressure ulcer of unspecified site, stage 2: Secondary | ICD-10-CM | POA: Diagnosis not present

## 2018-07-11 ENCOUNTER — Other Ambulatory Visit: Payer: Self-pay | Admitting: Family Medicine

## 2018-07-11 NOTE — Telephone Encounter (Signed)
Last office visit 12/26/2017 for hospital follow up.  Last refilled 06/01/2018 for #60 with no refills. No future appointments.

## 2018-07-12 NOTE — Telephone Encounter (Signed)
Pt with difficulty getting to office. Will refill. Used for muscle spasm in MS.

## 2018-07-15 DIAGNOSIS — G35 Multiple sclerosis: Secondary | ICD-10-CM | POA: Diagnosis not present

## 2018-07-15 DIAGNOSIS — L89309 Pressure ulcer of unspecified buttock, unspecified stage: Secondary | ICD-10-CM | POA: Diagnosis not present

## 2018-07-15 DIAGNOSIS — L8992 Pressure ulcer of unspecified site, stage 2: Secondary | ICD-10-CM | POA: Diagnosis not present

## 2018-07-19 ENCOUNTER — Ambulatory Visit: Payer: Self-pay | Admitting: Neurology

## 2018-07-22 ENCOUNTER — Other Ambulatory Visit: Payer: Self-pay

## 2018-07-22 ENCOUNTER — Emergency Department
Admission: EM | Admit: 2018-07-22 | Discharge: 2018-07-22 | Disposition: A | Discharge status: Home / Self Care | Payer: Medicare Other | Attending: Emergency Medicine | Admitting: Emergency Medicine

## 2018-07-22 DIAGNOSIS — N3090 Cystitis, unspecified without hematuria: Secondary | ICD-10-CM | POA: Insufficient documentation

## 2018-07-22 DIAGNOSIS — Z20828 Contact with and (suspected) exposure to other viral communicable diseases: Secondary | ICD-10-CM | POA: Insufficient documentation

## 2018-07-22 DIAGNOSIS — T83091A Other mechanical complication of indwelling urethral catheter, initial encounter: Secondary | ICD-10-CM | POA: Diagnosis not present

## 2018-07-22 DIAGNOSIS — Y658 Other specified misadventures during surgical and medical care: Secondary | ICD-10-CM | POA: Insufficient documentation

## 2018-07-22 DIAGNOSIS — R05 Cough: Secondary | ICD-10-CM | POA: Diagnosis not present

## 2018-07-22 DIAGNOSIS — T83038A Leakage of other indwelling urethral catheter, initial encounter: Secondary | ICD-10-CM | POA: Diagnosis present

## 2018-07-22 DIAGNOSIS — Z87891 Personal history of nicotine dependence: Secondary | ICD-10-CM | POA: Insufficient documentation

## 2018-07-22 DIAGNOSIS — I10 Essential (primary) hypertension: Secondary | ICD-10-CM | POA: Diagnosis not present

## 2018-07-22 DIAGNOSIS — Z79899 Other long term (current) drug therapy: Secondary | ICD-10-CM | POA: Diagnosis not present

## 2018-07-22 DIAGNOSIS — R29898 Other symptoms and signs involving the musculoskeletal system: Secondary | ICD-10-CM | POA: Diagnosis not present

## 2018-07-22 DIAGNOSIS — R6883 Chills (without fever): Secondary | ICD-10-CM | POA: Insufficient documentation

## 2018-07-22 DIAGNOSIS — I499 Cardiac arrhythmia, unspecified: Secondary | ICD-10-CM | POA: Diagnosis not present

## 2018-07-22 DIAGNOSIS — R5381 Other malaise: Secondary | ICD-10-CM | POA: Diagnosis not present

## 2018-07-22 LAB — CBC WITH DIFFERENTIAL/PLATELET
Abs Immature Granulocytes: 0.04 10*3/uL (ref 0.00–0.07)
Basophils Absolute: 0.1 10*3/uL (ref 0.0–0.1)
Basophils Relative: 1 %
Eosinophils Absolute: 0.4 10*3/uL (ref 0.0–0.5)
Eosinophils Relative: 3 %
HCT: 46.9 % (ref 39.0–52.0)
Hemoglobin: 15.1 g/dL (ref 13.0–17.0)
Immature Granulocytes: 0 %
Lymphocytes Relative: 23 %
Lymphs Abs: 2.9 10*3/uL (ref 0.7–4.0)
MCH: 29.1 pg (ref 26.0–34.0)
MCHC: 32.2 g/dL (ref 30.0–36.0)
MCV: 90.4 fL (ref 80.0–100.0)
Monocytes Absolute: 1 10*3/uL (ref 0.1–1.0)
Monocytes Relative: 8 %
Neutro Abs: 8.1 10*3/uL — ABNORMAL HIGH (ref 1.7–7.7)
Neutrophils Relative %: 65 %
Platelets: 313 10*3/uL (ref 150–400)
RBC: 5.19 MIL/uL (ref 4.22–5.81)
RDW: 13.1 % (ref 11.5–15.5)
WBC: 12.4 10*3/uL — ABNORMAL HIGH (ref 4.0–10.5)
nRBC: 0 % (ref 0.0–0.2)

## 2018-07-22 LAB — COMPREHENSIVE METABOLIC PANEL
ALT: 12 U/L (ref 0–44)
AST: 13 U/L — ABNORMAL LOW (ref 15–41)
Albumin: 3.6 g/dL (ref 3.5–5.0)
Alkaline Phosphatase: 96 U/L (ref 38–126)
Anion gap: 9 (ref 5–15)
BUN: 18 mg/dL (ref 6–20)
CO2: 25 mmol/L (ref 22–32)
Calcium: 8.7 mg/dL — ABNORMAL LOW (ref 8.9–10.3)
Chloride: 107 mmol/L (ref 98–111)
Creatinine, Ser: 0.91 mg/dL (ref 0.61–1.24)
GFR calc Af Amer: >60 mL/min (ref >60)
GFR calc non Af Amer: >60 mL/min (ref >60)
Glucose, Bld: 85 mg/dL (ref 70–99)
Potassium: 3.7 mmol/L (ref 3.5–5.1)
Sodium: 141 mmol/L (ref 135–145)
Total Bilirubin: 0.8 mg/dL (ref 0.3–1.2)
Total Protein: 7.1 g/dL (ref 6.5–8.1)

## 2018-07-22 LAB — URINALYSIS, COMPLETE (UACMP) WITH MICROSCOPIC
Bilirubin Urine: NEGATIVE
Glucose, UA: NEGATIVE mg/dL
Ketones, ur: NEGATIVE mg/dL
Nitrite: POSITIVE — AB
Protein, ur: 100 mg/dL — AB
Specific Gravity, Urine: 1.012 (ref 1.005–1.030)
Squamous Epithelial / HPF: NONE SEEN (ref 0–5)
WBC, UA: >50 WBC/hpf — ABNORMAL HIGH (ref 0–5)
pH: 6 (ref 5.0–8.0)

## 2018-07-22 LAB — LACTIC ACID, PLASMA: Lactic Acid, Venous: 0.9 mmol/L (ref 0.5–1.9)

## 2018-07-22 MED ORDER — CEPHALEXIN 500 MG PO CAPS: 500.0000 mg | ORAL_CAPSULE | Freq: Three times a day (TID) | ORAL | 0 refills | Status: AC

## 2018-07-22 MED ORDER — SODIUM CHLORIDE 0.9 % IV SOLN
1.0000 g | Freq: Once | INTRAVENOUS | Status: AC
  Administered 2018-07-22: 1 g via INTRAVENOUS
  Filled 2018-07-22: qty 10

## 2018-07-22 NOTE — ED Notes (Signed)
Urinary catheter removed, 21ml saline removed from the balloon, and disposed of, peri care performed, pt repositioned for comfort,

## 2018-07-22 NOTE — ED Triage Notes (Signed)
Pt comes into the ED via EMS from home with c/o catheter leaking out around the catheter and not into the bag. States it was replaced 2 weeks ago.

## 2018-07-22 NOTE — ED Notes (Signed)
Notified pt fiancee of discharge orders, EMS has been called to pick up patient. Patient verbally acknowledges discharge orders, not able to sign
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
## 2018-07-24 ENCOUNTER — Telehealth: Payer: Self-pay

## 2018-07-24 DIAGNOSIS — L89152 Pressure ulcer of sacral region, stage 2: Secondary | ICD-10-CM | POA: Diagnosis not present

## 2018-07-24 LAB — NOVEL CORONAVIRUS, NAA (HOSP ORDER, SEND-OUT TO REF LAB; TAT 18-24 HRS): SARS-CoV-2, NAA: NOT DETECTED

## 2018-07-24 LAB — URINE CULTURE

## 2018-07-24 NOTE — Telephone Encounter (Signed)
Attempted to reach patient on primary phone. Daughter [name] answered call. Patient was seen in ED for catheter placement. Daughter states she has consulted with Urology regarding leaky catheter and does not feel at this time an appt is necessary with PCP.  PCP notified.

## 2018-07-25 ENCOUNTER — Other Ambulatory Visit: Payer: Self-pay

## 2018-07-25 ENCOUNTER — Telehealth: Payer: Self-pay | Admitting: Emergency Medicine

## 2018-07-25 NOTE — Patient Outreach (Signed)
[facility]) Care Management  07/25/2018  ROLLEN SELDERS 1960/04/20 [id number]    Telephone Screen Referral Date :07/25/2018 Referral Source:[facility] referral Referral Reason: Assistance with needs Insurance:[employer]  Outreach attempt # 1 to patient.  HIPAA verified by daughter [name] on ROI.  [place] completed screening.   Social: Per Daughter her father lives in the home with a male York Cerise who is not a family member.  She doe snot feel that Roselyn Reef is helping her father. [name] is supportive of her father and lives 10 minutes away.  He needs assistance with his daily care needs.  The patient is a quadriplegic. He currently has [facility] transportation to doctors appointments but the daughter would like to see if there is something else available.  He has a hospital bed and wheelchair in the home.  Conditions: Per speaking with the daughter and chart review the patient's conditions include: HTN, GERD, Multiple Sclerosis, Spastic quadriparesis, Recurrent UTI and High cholesterol. [name] states that her father does have hypertension.  His blood pressure has been so high that it was in stroke range.  He is on medications for blood pressure   And she states that they have been able to keep lower. He does not check  his blood pressure on a regular basis.  He had a blood pressure cuff but she is not sure if it still works.  She is not sure if he is receiving the correct diet.  She feels that he does not get feed regularly.   Medications: [name] states that he is on about 10 medications.  She states that he has no problems paying for his medications.  Appointments:  The patient had an appointment with his PCP two months ago but will bed having Telehealth appointment in the next few weeks  Plan: [place] will make a referral for Health Coach for HTN, Referral to Social Work for assistance with meals, possible aid and transportation.   [name] RN, BSN, [place] Direct Dial:  [phone] Fax: [phone]

## 2018-07-25 NOTE — Telephone Encounter (Signed)
Called to give covid 19 negative result.  Gave to Roman Forest -daughter.

## 2018-07-26 ENCOUNTER — Other Ambulatory Visit: Payer: Self-pay
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
## 2018-07-26 NOTE — Progress Notes (Signed)
This encounter was created in error - please disregard.

## 2018-07-31 ENCOUNTER — Ambulatory Visit: Payer: Self-pay

## 2018-07-31 ENCOUNTER — Other Ambulatory Visit: Payer: Self-pay | Admitting: Family Medicine

## 2018-07-31 ENCOUNTER — Other Ambulatory Visit: Payer: Self-pay

## 2018-07-31 NOTE — Telephone Encounter (Signed)
Last office visit 12/26/2017 for hospital follow up.  Last refilled 06/01/2018 for #90 with no refills.  No future appointments.

## 2018-07-31 NOTE — Patient Outreach (Signed)
[facility]) Care Management  07/31/2018  Travis Cantrell Dec 26, 1960 [id number]   Attempted to contact patient's daughter to follow up on possible resources for assistance with purchase of handicap accessible vehicle.  BSW left voicemail message.  Will attempt to reach again within four business days.  [name], BSW Social Worker [phone]

## 2018-08-01 ENCOUNTER — Other Ambulatory Visit: Payer: Self-pay | Admitting: Family Medicine

## 2018-08-01 NOTE — Telephone Encounter (Signed)
Last office visit 12/26/2017 for Hospital follow up.  Last refilled 07/12/2018 for #60 with no refills.  No future appointments.

## 2018-08-02 ENCOUNTER — Ambulatory Visit: Payer: Self-pay

## 2018-08-02 ENCOUNTER — Other Ambulatory Visit: Payer: Self-pay

## 2018-08-02 NOTE — Patient Outreach (Signed)
[facility]) Care Management  08/02/2018  Travis Cantrell 1960/05/27 [id number]   Second unsuccessful attempt to contact patient's daughter to follow up on possible resources for assistance with purchase of handicap accessible vehicle.  BSW left voicemail message.  Will attempt to reach again within four business days.  [name], BSW Social Worker [phone]

## 2018-08-06 ENCOUNTER — Ambulatory Visit: Payer: Self-pay

## 2018-08-06 ENCOUNTER — Other Ambulatory Visit: Payer: Self-pay

## 2018-08-06 NOTE — Patient Outreach (Signed)
[facility]) Care Management  08/06/2018  CINQUE BEGLEY 18-Oct-1960 [id number]   Third unsuccessful attempt to contact patient's daughter to follow up on possible resources for assistance with purchase of handicap accessible vehicle. BSW left voicemail message. Will close case if no response by 08/08/18.  [name], BSW Social Worker [phone]

## 2018-08-08 ENCOUNTER — Other Ambulatory Visit: Payer: Self-pay

## 2018-08-08 NOTE — Patient Outreach (Signed)
[facility]) Care Management  08/08/2018  Travis Cantrell January 18, 1961 [id number]   BSW closing case due to inability to maintain contact.  [name], BSW Social Worker [phone]

## 2018-08-09 ENCOUNTER — Other Ambulatory Visit: Payer: Self-pay

## 2018-08-09 NOTE — Patient Outreach (Signed)
[facility]) Care Management  08/09/2018  Travis Cantrell [birth year] [id number]    1st outreach attempt to the patient for initial assessment.  No answer.  Unable to leave a voicemail due to the voicemail box was full.  Plan: RN Health Coach will send letter. [place] will make outreach attempt to the patient within thirty business days.  [name] RN, BSN, [place] Direct Dial:  [phone]  Fax: [phone]

## 2018-08-14 DIAGNOSIS — G35 Multiple sclerosis: Secondary | ICD-10-CM | POA: Diagnosis not present

## 2018-08-14 DIAGNOSIS — L8992 Pressure ulcer of unspecified site, stage 2: Secondary | ICD-10-CM | POA: Diagnosis not present

## 2018-08-14 DIAGNOSIS — L89309 Pressure ulcer of unspecified buttock, unspecified stage: Secondary | ICD-10-CM | POA: Diagnosis not present

## 2018-08-21 ENCOUNTER — Telehealth: Payer: Self-pay

## 2018-08-21 ENCOUNTER — Ambulatory Visit (INDEPENDENT_AMBULATORY_CARE_PROVIDER_SITE_OTHER): Payer: Medicare Other | Admitting: Family Medicine

## 2018-08-21 ENCOUNTER — Other Ambulatory Visit: Payer: Self-pay

## 2018-08-21 ENCOUNTER — Encounter: Payer: Self-pay | Admitting: Family Medicine

## 2018-08-21 VITALS — BP 130/94 | HR 110 | Temp 98.3°F

## 2018-08-21 DIAGNOSIS — G35 Multiple sclerosis: Secondary | ICD-10-CM

## 2018-08-21 DIAGNOSIS — R7303 Prediabetes: Secondary | ICD-10-CM

## 2018-08-21 DIAGNOSIS — F331 Major depressive disorder, recurrent, moderate: Secondary | ICD-10-CM

## 2018-08-21 DIAGNOSIS — E7800 Pure hypercholesterolemia, unspecified: Secondary | ICD-10-CM

## 2018-08-21 DIAGNOSIS — M2450 Contracture, unspecified joint: Secondary | ICD-10-CM

## 2018-08-21 DIAGNOSIS — G8250 Quadriplegia, unspecified: Secondary | ICD-10-CM

## 2018-08-21 DIAGNOSIS — I10 Essential (primary) hypertension: Secondary | ICD-10-CM | POA: Diagnosis not present

## 2018-08-21 NOTE — Assessment & Plan Note (Signed)
ELevated in office.. pt not sure if took lisinopril today... Will work on med compliance and check BP daily .. goal <140/90.

## 2018-08-21 NOTE — Assessment & Plan Note (Signed)
At goal on statin at last check. 

## 2018-08-21 NOTE — Telephone Encounter (Signed)
Diane pts sister (DPR signed) left v/m wanting to know if pt has copay for his visit scheduled today with DR Diona Browner at 2:40. Diane request cb.

## 2018-08-21 NOTE — Assessment & Plan Note (Signed)
Pt not interested in neuro visit or medication.

## 2018-08-21 NOTE — Progress Notes (Signed)
Chief Complaint  Patient presents with  . Needs Arm Measurement for Braces    History of Present Illness: HPI  58 year old male patient with progressive MS in wheelchair presents for follow up. He is feeling great overall, no pain.  Occ trigeminal neuralgia.Marland Kitchen. well controlled on tramadol BID.   He is requesting arm braces and needs us to measure his arms.  Uses valium for spasticity and contractures.  Elevated Cholesterol: On atorvastatin. Using medications without problems:none Muscle aches: none Diet compliance: moderate Exercise:none Other complaints: prediabetes  Lab Results  Component Value Date   HGBA1C 5.0 12/01/2017   Hypertension:    On lisinopril.. he thinks he took today but not sure. BP Readings from Last 3 Encounters:  08/21/18 (!) 123/100  07/22/18 (!) 127/91  05/30/18 110/79  Using medication without problems or lightheadedness: none Chest pain with exertion:none Edema:none Short of breath:none Average home BPs: not checking recently Other issues:  MDD , stable control on  prozac Depression screen [facility] 08/21/2018 07/25/2018 12/01/2017  Decreased Interest 0 (No Data) 0  Down, Depressed, Hopeless 0 - 0  PHQ - 2 Score 0 - 0  Some recent data might be hidden    COVID 19 screen No recent travel or known exposure to COVID19 The patient denies respiratory symptoms of COVID 19 at this time.  The importance of social distancing was discussed today.   Review of Systems  Constitutional: Negative for chills and fever.  HENT: Negative for congestion and ear pain.   Eyes: Negative for pain and redness.  Respiratory: Negative for cough and shortness of breath.   Cardiovascular: Negative for chest pain, palpitations and leg swelling.  Gastrointestinal: Negative for abdominal pain, blood in stool, constipation, diarrhea, nausea and vomiting.  Genitourinary: Negative for dysuria.  Musculoskeletal: Negative for falls and myalgias.  Skin: Negative for rash.   Neurological: Negative for dizziness.  Psychiatric/Behavioral: Negative for depression. The patient is not nervous/anxious.       Past Medical History:  Diagnosis Date  . Allergy   . Decubitus ulcer   . GERD (gastroesophageal reflux disease)   . Hypertension   . MS (multiple sclerosis) (HCC)     reports that he has quit smoking. He has never used smokeless tobacco. He reports that he does not drink alcohol or use drugs.   Current Outpatient Medications:  .  albuterol (PROVENTIL) (2.5 MG/3ML) 0.083% nebulizer solution, Take 3 mLs (2.5 mg total) by nebulization every 4 (four) hours as needed for wheezing or shortness of breath., Disp: 75 mL, Rfl: 11 .  atorvastatin (LIPITOR) 40 MG tablet, Take 40 mg by mouth daily., Disp: , Rfl:  .  bisacodyl (DULCOLAX) 10 MG suppository, Place 1 suppository (10 mg total) rectally as needed for moderate constipation., Disp: 12 suppository, Rfl: 0 .  bisacodyl (DULCOLAX) 5 MG EC tablet, Take 1 tablet (5 mg total) by mouth daily as needed for moderate constipation., Disp: 30 tablet, Rfl: 0 .  butalbital-acetaminophen-caffeine (FIORICET, ESGIC) 50-325-40 MG tablet, Take 1 tablet by mouth every 6 (six) hours as needed for headache or migraine., Disp: , Rfl:  .  Catheters (DOVER HYDROGEL FOLEY CATH 18FR) MISC, Change cathter every 2 weeks, Disp: 12 each, Rfl: 11 .  Cholecalciferol (VITAMIN D3) 2000 units TABS, Take 4,000 Units by mouth daily., Disp: , Rfl:  .  CRANBERRY PO, Take 1 capsule by mouth daily., Disp: , Rfl:  .  diazepam (VALIUM) 5 MG tablet, TAKE 1 TABLET BY MOUTH EVERY 12 HOURS  AS NEEDED, Disp: 60 tablet, Rfl: 0 .  FLUoxetine (PROZAC) 20 MG capsule, Take 1 capsule (20 mg total) by mouth daily., Disp: , Rfl:  .  ibuprofen (ADVIL) 800 MG tablet, TAKE 1 TABLET BY MOUTH EVERY 8 HOURS AS NEEDED, Disp: 90 tablet, Rfl: 0 .  lisinopril (PRINIVIL,ZESTRIL) 20 MG tablet, TAKE 1 TABLET BY MOUTH ONCE DAILY (Patient taking differently: Take 20 mg by mouth daily.  ), Disp: 90 tablet, Rfl: 1 .  loratadine (CLARITIN) 10 MG tablet, Take 10 mg by mouth daily as needed for allergies., Disp: , Rfl:  .  Multiple Vitamin (MULTIVITAMIN WITH MINERALS) TABS tablet, Take 1 tablet by mouth daily., Disp: , Rfl:  .  nystatin (NYSTATIN) powder, Apply 1 g topically 4 (four) times daily. Apply for rash. , Disp: , Rfl:  .  omeprazole (PRILOSEC) 20 MG capsule, Take 1 capsule (20 mg total) by mouth 2 (two) times daily before a meal., Disp: , Rfl:  .  Oxcarbazepine (TRILEPTAL) 300 MG tablet, Take 1 tablet (300 mg total) by mouth 2 (two) times daily., Disp: 60 tablet, Rfl: 5 .  polyethylene glycol (MIRALAX / GLYCOLAX) packet, Take 17 g by mouth 2 (two) times daily., Disp: 14 each, Rfl: 0 .  traMADol (ULTRAM) 50 MG tablet, Take 1 tablet (50 mg total) by mouth every 6 (six) hours as needed for moderate pain., Disp: , Rfl:    Observations/Objective: Blood pressure (!) 123/100, pulse (!) 110, temperature 98.3 F (36.8 C), temperature source Temporal, SpO2 97 %.  Physical Exam Constitutional:      General: He is not in acute distress.    Appearance: Normal appearance. He is not ill-appearing or toxic-appearing.     Comments: Wheelchair bound  HENT:     Head: Normocephalic and atraumatic.     Right Ear: Hearing, tympanic membrane, ear canal and external ear normal. No tenderness. No foreign body. Tympanic membrane is not retracted or bulging.     Left Ear: Hearing, tympanic membrane, ear canal and external ear normal. No tenderness. No foreign body. Tympanic membrane is not retracted or bulging.     Nose: Nose normal. No mucosal edema or rhinorrhea.     Right Sinus: No maxillary sinus tenderness or frontal sinus tenderness.     Left Sinus: No maxillary sinus tenderness or frontal sinus tenderness.     Mouth/Throat:     Dentition: Normal dentition. No dental caries.     Pharynx: Uvula midline. No oropharyngeal exudate.     Tonsils: No tonsillar abscesses.  Eyes:     General:  Lids are normal. Lids are everted, no foreign bodies appreciated.     Conjunctiva/sclera: Conjunctivae normal.     Pupils: Pupils are equal, round, and reactive to light.  Neck:     Musculoskeletal: Decreased range of motion. No neck rigidity.     Thyroid: No thyroid mass or thyromegaly.     Vascular: No carotid bruit.     Trachea: Trachea and phonation normal.     Comments: Neck movement at baseline Cardiovascular:     Rate and Rhythm: Normal rate and regular rhythm.     Pulses: Normal pulses.     Heart sounds: Normal heart sounds, S1 normal and S2 normal. No murmur. No gallop.   Pulmonary:     Effort: Pulmonary effort is normal. No respiratory distress.     Breath sounds: Normal breath sounds. No wheezing, rhonchi or rales.  Abdominal:     General: Bowel sounds are normal.  Palpations: Abdomen is soft.     Tenderness: There is no abdominal tenderness. There is no guarding or rebound.     Hernia: No hernia is present.  Musculoskeletal:     Comments: Bilateral arm, wrist and hand contractures  Right  shoulder to elbow 13 inches  elbow to wrist 10 inches  wrist circumference 6.5 inches  bicep circumference: 10 inches   Left Shoulder to elbow 11 inches Elbow to wrist. 10 inches Wrist circumference 6. 5 inches Bicep circumference: 11 iches  Skin:    General: Skin is warm and dry.     Findings: No rash.  Neurological:     Mental Status: He is alert.     Deep Tendon Reflexes: Reflexes are normal and symmetric.  Psychiatric:        Speech: Speech normal.        Behavior: Behavior normal.        Judgment: Judgment normal.      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  Pt request splint for contractures... will refer to POT for recommendations  Multiple sclerosis, primary progressive Pt not interested in neuro visit or  medication.  High cholesterol  At goal on statin at last check.  Essential hypertension, benign ELevated in office.. pt not sure if took lisinopril today... Will work on med compliance and check BP daily .. goal <140/90.     Eliezer Lofts, MD

## 2018-08-21 NOTE — Patient Instructions (Signed)
Follow BP at home.. Call > 140/90.

## 2018-08-21 NOTE — Assessment & Plan Note (Signed)
Due to MS.  Pt request splint for contractures... will refer to POT for recommendations

## 2018-08-21 NOTE — Telephone Encounter (Signed)
I left msg for Diane, pt does not have a copay today.

## 2018-08-21 NOTE — Assessment & Plan Note (Addendum)
Good control on prozac

## 2018-08-21 NOTE — Assessment & Plan Note (Signed)
Low carb diet 

## 2018-08-30 ENCOUNTER — Other Ambulatory Visit: Payer: Self-pay | Admitting: Family Medicine

## 2018-08-30 DIAGNOSIS — G35 Multiple sclerosis: Secondary | ICD-10-CM

## 2018-08-30 DIAGNOSIS — G8250 Quadriplegia, unspecified: Secondary | ICD-10-CM

## 2018-08-30 DIAGNOSIS — M62421 Contracture of muscle, right upper arm: Secondary | ICD-10-CM

## 2018-08-31 NOTE — Telephone Encounter (Signed)
Last office visit 08/21/2018 for Arms measurements for braces.  Last refilled diazepam 08/02/2018 for #60 with no refills.  Ibuprofen 07/31/2018 for #90 with no refills.  CPE scheduled for 12/28/2018.

## 2018-09-02 ENCOUNTER — Other Ambulatory Visit: Payer: Self-pay

## 2018-09-02 ENCOUNTER — Emergency Department
Admission: EM | Admit: 2018-09-02 | Discharge: 2018-09-03 | Disposition: A | Discharge status: Home / Self Care | Payer: Medicare Other | Attending: Emergency Medicine | Admitting: Emergency Medicine

## 2018-09-02 DIAGNOSIS — I10 Essential (primary) hypertension: Secondary | ICD-10-CM | POA: Insufficient documentation

## 2018-09-02 DIAGNOSIS — T83091A Other mechanical complication of indwelling urethral catheter, initial encounter: Secondary | ICD-10-CM | POA: Diagnosis not present

## 2018-09-02 DIAGNOSIS — Z87891 Personal history of nicotine dependence: Secondary | ICD-10-CM | POA: Insufficient documentation

## 2018-09-02 DIAGNOSIS — Y732 Prosthetic and other implants, materials and accessory gastroenterology and urology devices associated with adverse incidents: Secondary | ICD-10-CM | POA: Insufficient documentation

## 2018-09-02 DIAGNOSIS — Z960 Presence of urogenital implants: Secondary | ICD-10-CM | POA: Diagnosis not present

## 2018-09-02 DIAGNOSIS — T839XXA Unspecified complication of genitourinary prosthetic device, implant and graft, initial encounter: Secondary | ICD-10-CM

## 2018-09-02 DIAGNOSIS — G35 Multiple sclerosis: Secondary | ICD-10-CM | POA: Insufficient documentation

## 2018-09-02 DIAGNOSIS — R609 Edema, unspecified: Secondary | ICD-10-CM | POA: Diagnosis not present

## 2018-09-02 DIAGNOSIS — Z79899 Other long term (current) drug therapy: Secondary | ICD-10-CM | POA: Insufficient documentation

## 2018-09-02 LAB — URINALYSIS, COMPLETE (UACMP) WITH MICROSCOPIC
Bacteria, UA: NONE SEEN
Bilirubin Urine: NEGATIVE
Glucose, UA: NEGATIVE mg/dL
Ketones, ur: NEGATIVE mg/dL
Nitrite: NEGATIVE
Protein, ur: 100 mg/dL — AB
RBC / HPF: >50 RBC/hpf — ABNORMAL HIGH (ref 0–5)
Specific Gravity, Urine: 1.015 (ref 1.005–1.030)
Squamous Epithelial / HPF: NONE SEEN (ref 0–5)
WBC, UA: >50 WBC/hpf — ABNORMAL HIGH (ref 0–5)
pH: 6 (ref 5.0–8.0)

## 2018-09-02 MED ORDER — CEPHALEXIN 500 MG PO CAPS: 500.0000 mg | ORAL_CAPSULE | Freq: Three times a day (TID) | ORAL | 0 refills | Status: DC

## 2018-09-02 MED ORDER — CEPHALEXIN 500 MG PO CAPS
500.0000 mg | ORAL_CAPSULE | Freq: Once | ORAL | Status: AC
  Administered 2018-09-02: 23:00:00 500 mg via ORAL
  Filled 2018-09-02: qty 1
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
## 2018-09-02 NOTE — ED Triage Notes (Signed)
First nurse note: Patient coming in [facility]. Patient reports decreased urinary output from catheter. patient reports hx of same.   BP 110 palpated, HR 82, 97% RA, RR 14 CBG 206, temp 98.1
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
## 2018-09-03 DIAGNOSIS — Z7401 Bed confinement status: Secondary | ICD-10-CM | POA: Diagnosis not present

## 2018-09-03 DIAGNOSIS — M2550 Pain in unspecified joint: Secondary | ICD-10-CM | POA: Diagnosis not present

## 2018-09-03 DIAGNOSIS — R0902 Hypoxemia: Secondary | ICD-10-CM | POA: Diagnosis not present

## 2018-09-03 NOTE — ED Notes (Signed)
Pt resting quietly in room at this time.  

## 2018-09-03 NOTE — ED Notes (Signed)
Pt asleep in room at this time.

## 2018-09-04 LAB — URINE CULTURE

## 2018-09-05 ENCOUNTER — Ambulatory Visit: Payer: Self-pay

## 2018-09-11 ENCOUNTER — Telehealth: Payer: Self-pay

## 2018-09-11 ENCOUNTER — Other Ambulatory Visit: Payer: Self-pay

## 2018-09-11 ENCOUNTER — Other Ambulatory Visit: Payer: Self-pay | Admitting: Family Medicine

## 2018-09-11 NOTE — Telephone Encounter (Signed)
Noted  

## 2018-09-11 NOTE — Patient Outreach (Signed)
[facility]) Care Management  09/11/2018  MOHAB ASHBY [birth year] [id number]    2nd unsuccessful outreach attempt.  No answer.  Unable to leave a message due to the mail box is full.  Plan: RN Health Coach will make outreach attempt to the patient within thirty business  days.  [name] RN, BSN, [place] Direct Dial:  [phone]  Fax: [phone]

## 2018-09-11 NOTE — Telephone Encounter (Signed)
Made not in chart at med list.

## 2018-09-11 NOTE — Telephone Encounter (Signed)
Last office visit 08/21/2018 for arm measurements.  Last refilled 07/04/2017 for 60 g with 5 refills.  CPE scheduled for 12/28/2018.

## 2018-09-11 NOTE — Telephone Encounter (Signed)
pts sister Diane Corporate investment banker signed) said that Brain Hilts who was care giver for pt is no longer employed as a caregiver for pt and DO NOT refill any meds for pt at [facility] request. Diane said that she has taken pts ibuprofen and also filled diazepam and pt did not get med. I asked if the police had been contacted and Diane said "No;they were just glad to get rid of her". FYI to Dr Diona Browner and Butch Penny CMA.

## 2018-09-12 ENCOUNTER — Other Ambulatory Visit: Payer: Self-pay

## 2018-09-12 NOTE — Patient Outreach (Signed)
[facility]) Care Management  09/12/2018  JOE GEE 09-30-1960 [id number]   3rd unsuccessful outreach attempt made.  No answer.  Unable to leave a message due to the mail box is full.    Plan: RN Health Coach has made several attempts to outreach the patient.  If no response to calls and letter in ten business days Duncan will proceed with case closure.    [name] RN, BSN, [place] Direct Dial:  [phone]  Fax: [phone]

## 2018-09-14 DIAGNOSIS — L8992 Pressure ulcer of unspecified site, stage 2: Secondary | ICD-10-CM | POA: Diagnosis not present

## 2018-09-14 DIAGNOSIS — G35 Multiple sclerosis: Secondary | ICD-10-CM | POA: Diagnosis not present

## 2018-09-14 DIAGNOSIS — L89309 Pressure ulcer of unspecified buttock, unspecified stage: Secondary | ICD-10-CM | POA: Diagnosis not present

## 2018-09-18 ENCOUNTER — Telehealth: Payer: Self-pay | Admitting: *Deleted

## 2018-09-18 MED ORDER — IBUPROFEN 800 MG PO TABS: 800.0000 mg | ORAL_TABLET | Freq: Three times a day (TID) | ORAL | 0 refills | Status: DC | PRN

## 2018-09-18 NOTE — Telephone Encounter (Signed)
Spoke with pharmacist at [facility].  Insurance will not cover early refill but they can pay out of pocket for it. There is no refills available on file.  Spoke with Levander Campion and advised her of this information.  She is agreeable to pay out of pocket for.  Refill on Ibuprofen 800 mg sent into [facility]

## 2018-09-18 NOTE — Telephone Encounter (Signed)
I can approve but insurance may not pay. Can take 200 mg 4 tabs daily

## 2018-09-18 NOTE — Telephone Encounter (Signed)
Patient's sister Levander Campion (on [place]) left a voicemail stating that the lady taking care of Hilary left and took his Ibuprofen with her. Levander Campion stated that they tried to get a refill and the pharmacist told them it was too early. Dianne wants to know if Dr. Ardine Bjork will approve the refill early? Dianne requested a call back. [facility]

## 2018-09-19 DIAGNOSIS — L89152 Pressure ulcer of sacral region, stage 2: Secondary | ICD-10-CM | POA: Diagnosis not present

## 2018-09-22 ENCOUNTER — Other Ambulatory Visit: Payer: Self-pay | Admitting: Neurology

## 2018-09-25 ENCOUNTER — Telehealth: Payer: Self-pay

## 2018-09-25 ENCOUNTER — Other Ambulatory Visit: Payer: Self-pay

## 2018-09-25 NOTE — Telephone Encounter (Signed)
Travis Cantrell DPR signed requesting status of refill foc oxcarbazepine. Advised to contact [facility] [address].Dr Felecia Shelling had previously filled. Travis Cantrell voiced understanding.

## 2018-09-25 NOTE — Patient Outreach (Signed)
[facility]) Care Management  09/25/2018  JABREEL CHIMENTO 07/13/1960 [id number]    Multiple attempts to establish contact with the patient without success. No response from calls or letter mailed to patient. Case is being closed at this time.  [name] RN, BSN, [place] Direct Dial:  [phone]  Fax: [phone]

## 2018-09-28 ENCOUNTER — Other Ambulatory Visit (INDEPENDENT_AMBULATORY_CARE_PROVIDER_SITE_OTHER): Payer: Medicare Other

## 2018-09-28 ENCOUNTER — Telehealth: Payer: Self-pay

## 2018-09-28 DIAGNOSIS — N390 Urinary tract infection, site not specified: Secondary | ICD-10-CM | POA: Diagnosis not present

## 2018-09-28 LAB — POC URINALSYSI DIPSTICK (AUTOMATED)
Bilirubin, UA: NEGATIVE
Glucose, UA: NEGATIVE
Ketones, UA: NEGATIVE
Nitrite, UA: POSITIVE
Protein, UA: POSITIVE — AB
Spec Grav, UA: 1.015 (ref 1.010–1.025)
Urobilinogen, UA: 0.2 E.U./dL
pH, UA: 8 (ref 5.0–8.0)

## 2018-09-28 NOTE — Telephone Encounter (Signed)
We do not empirically treat clogged catheter with antibiotics. Can someone drop off urine sample for UA, micro and culture? Has he had any unrinary symptoms? Urinary odor? Fever?

## 2018-09-28 NOTE — Telephone Encounter (Addendum)
Diane notified as instructed by telephone.  She is not currently with [name] but will try and go by there to get a urine sample if she is able.

## 2018-09-28 NOTE — Telephone Encounter (Signed)
Travis Cantrell said catheter line had a lot of dark sediment in it and was unable to flush out so changed catheter. Travis wants to know if pt can have abx; pt has no complaints of pain and no fever.pt's sister wants abx due to clogged catheter line. Pt drinking a lot of [facility] [address]

## 2018-09-30 LAB — URINE CULTURE
MICRO NUMBER:: 849480
SPECIMEN QUALITY:: ADEQUATE

## 2018-10-04 ENCOUNTER — Telehealth: Payer: Self-pay

## 2018-10-04 NOTE — Telephone Encounter (Addendum)
Pt's sister, Shauna Hugh, called to ask for rx for 18 with 30 balloon catheter that hooks to the back. He usually gets 2 a month, but they have been clogging lately. She has flushed the line several times. Asking for a written rx to pickup. She said he needs it as soon as possible.

## 2018-10-04 NOTE — Telephone Encounter (Signed)
Ok to wait until tomorrow

## 2018-10-04 NOTE — Telephone Encounter (Signed)
I can write when in office tommorow.. if needs sooner ask Dr. Lorelei Pont to complete rx for me.

## 2018-10-05 NOTE — Telephone Encounter (Signed)
Patient's sister left another message requesting an order for a catheter.Marland Kitchen

## 2018-10-05 NOTE — Telephone Encounter (Signed)
In outbox.Marland Kitchen verify rx if correct before pick up

## 2018-10-05 NOTE — Telephone Encounter (Signed)
Diane notified that prescription for catheters are ready to be picked up at the front desk.

## 2018-10-15 DIAGNOSIS — L89309 Pressure ulcer of unspecified buttock, unspecified stage: Secondary | ICD-10-CM | POA: Diagnosis not present

## 2018-10-15 DIAGNOSIS — G35 Multiple sclerosis: Secondary | ICD-10-CM | POA: Diagnosis not present

## 2018-10-15 DIAGNOSIS — L8992 Pressure ulcer of unspecified site, stage 2: Secondary | ICD-10-CM | POA: Diagnosis not present

## 2018-10-19 ENCOUNTER — Telehealth: Payer: Self-pay | Admitting: *Deleted

## 2018-10-19 NOTE — Telephone Encounter (Signed)
Please call Travis Cantrell to determine what needs to be done to get the patient the braces.

## 2018-10-19 NOTE — Telephone Encounter (Signed)
Charmaine... do I need another referral for [facility] OT to get these braces for his contractures made? If so let me know.

## 2018-10-19 NOTE — Telephone Encounter (Signed)
Left message for Travis Cantrell that Dr. Diona Browner placed referral for Home OT because he saw [place],  in 03/2018 and she recommended the splints.  Dr. Diona Browner  did not know what to write on prescription or what measurements were needed so that is why she referred to OT.  If braces have been order, it was through home health OT and not [place].

## 2018-10-19 NOTE — Telephone Encounter (Signed)
[name] NP with [name] left a voicemail stating that she is seeing the patient and he has not gotten the braces for his hands and he really needs them. Travis Cantrell stated that these were ordered over a month ago.  Travis Cantrell requested that this be checked on and contact the patient or her back to let them know the status.

## 2018-10-19 NOTE — Telephone Encounter (Signed)
Starling Manns, NP returned Travis Cantrell's call.  She said the patient thought she was bringing the braces.  She was just following up for patient about the braces.  Patient will need to see OT to get the hand braces.  Please place another referral for OT.

## 2018-10-20 ENCOUNTER — Other Ambulatory Visit: Payer: Self-pay | Admitting: Neurology

## 2018-10-20 ENCOUNTER — Other Ambulatory Visit: Payer: Self-pay | Admitting: Family Medicine

## 2018-10-22 DIAGNOSIS — L89152 Pressure ulcer of sacral region, stage 2: Secondary | ICD-10-CM | POA: Diagnosis not present

## 2018-10-22 NOTE — Telephone Encounter (Signed)
Last office visit 08/21/2018 for measurements for arm braces.  Last refilled 09/18/2018 for #90 with no refills.  [place] scheduled for 12/28/2018

## 2018-10-24 ENCOUNTER — Other Ambulatory Visit: Payer: Self-pay | Admitting: Neurology

## 2018-10-25 ENCOUNTER — Telehealth: Payer: Self-pay | Admitting: *Deleted

## 2018-10-25 ENCOUNTER — Other Ambulatory Visit: Payer: Self-pay | Admitting: Neurology

## 2018-10-25 MED ORDER — OXCARBAZEPINE 300 MG PO TABS: 300.0000 mg | ORAL_TABLET | Freq: Two times a day (BID) | ORAL | 0 refills | Status: DC

## 2018-10-25 NOTE — Telephone Encounter (Signed)
As long as rx stable and trigeminal neuralgia not worsening. Okay to refill for now.

## 2018-10-25 NOTE — Telephone Encounter (Signed)
Received fax from [facility] stating: Patient would like for Dr. Diona Browner to prescribe his oxcarbazepine 300 mg.  Please advise.  Ok to refill?

## 2018-10-25 NOTE — Telephone Encounter (Signed)
Per Reche Dixon, [facility], need to contact [place], [phone].  Per [email or web address] , need to write Rx to evaluate and treat for hand and wrist splint and fax with demographics information to [phone].

## 2018-10-26 NOTE — Telephone Encounter (Signed)
Order faxed to [facility] [place].

## 2018-10-26 NOTE — Telephone Encounter (Signed)
In donna's box. 

## 2018-11-07 ENCOUNTER — Telehealth: Payer: Self-pay

## 2018-11-07 NOTE — Telephone Encounter (Signed)
Spoke w/Travis Cantrell [employer].  Travis Cantrell is Travis Cantrell's stepdaughter who arranges his transportation.  Gave [email or web address]  phone# to call and r/s.  Also informed her of pt's next appt on 12-28-2018@3 :15 pm.

## 2018-11-07 NOTE — Telephone Encounter (Signed)
Travis Cantrell,   Weren't you working on this?

## 2018-11-07 NOTE — Telephone Encounter (Signed)
Travis Cantrell faxed order to [facility] on 10-19-2018.  Travis Cantrell spoke w/Travis Cantrell (of the home - daughter?) on 11-02-2018.  Travis Cantrell was to c/b this week to [facility] and r/s appt w/[facility] for fit and evaluation.

## 2018-11-07 NOTE — Telephone Encounter (Signed)
Travis Cantrell(DPR signed) left v/m wanting cb that nurse came to house about pt arm braces but has not heard anything else about arm braces since nurse came out. Also wants to know when pts next appt is.(per pts appt desk pt has CPX scheduled with Dr Diona Browner on 12/28/18 at 3:15.) [place] request cb.

## 2018-11-14 DIAGNOSIS — L8992 Pressure ulcer of unspecified site, stage 2: Secondary | ICD-10-CM | POA: Diagnosis not present

## 2018-11-14 DIAGNOSIS — L89309 Pressure ulcer of unspecified buttock, unspecified stage: Secondary | ICD-10-CM | POA: Diagnosis not present

## 2018-11-14 DIAGNOSIS — G35 Multiple sclerosis: Secondary | ICD-10-CM | POA: Diagnosis not present

## 2018-11-14 DIAGNOSIS — L89152 Pressure ulcer of sacral region, stage 2: Secondary | ICD-10-CM | POA: Diagnosis not present

## 2018-11-21 DIAGNOSIS — L89152 Pressure ulcer of sacral region, stage 2: Secondary | ICD-10-CM | POA: Diagnosis not present

## 2018-11-28 ENCOUNTER — Encounter: Payer: Self-pay | Admitting: *Deleted

## 2018-11-28 ENCOUNTER — Other Ambulatory Visit: Payer: Self-pay

## 2018-11-28 ENCOUNTER — Emergency Department
Admission: EM | Admit: 2018-11-28 | Discharge: 2018-11-29 | Disposition: A | Discharge status: Home / Self Care | Payer: Medicare Other | Attending: Emergency Medicine | Admitting: Emergency Medicine

## 2018-11-28 DIAGNOSIS — G35 Multiple sclerosis: Secondary | ICD-10-CM | POA: Insufficient documentation

## 2018-11-28 DIAGNOSIS — I10 Essential (primary) hypertension: Secondary | ICD-10-CM | POA: Insufficient documentation

## 2018-11-28 DIAGNOSIS — R102 Pelvic and perineal pain: Secondary | ICD-10-CM | POA: Diagnosis present

## 2018-11-28 DIAGNOSIS — Z87891 Personal history of nicotine dependence: Secondary | ICD-10-CM | POA: Diagnosis not present

## 2018-11-28 DIAGNOSIS — T83021A Displacement of indwelling urethral catheter, initial encounter: Secondary | ICD-10-CM | POA: Diagnosis not present

## 2018-11-28 DIAGNOSIS — N3000 Acute cystitis without hematuria: Secondary | ICD-10-CM | POA: Diagnosis not present

## 2018-11-28 DIAGNOSIS — Z743 Need for continuous supervision: Secondary | ICD-10-CM | POA: Diagnosis not present

## 2018-11-28 DIAGNOSIS — Z79899 Other long term (current) drug therapy: Secondary | ICD-10-CM | POA: Diagnosis not present

## 2018-11-28 DIAGNOSIS — Z960 Presence of urogenital implants: Secondary | ICD-10-CM | POA: Diagnosis not present

## 2018-11-28 DIAGNOSIS — N3090 Cystitis, unspecified without hematuria: Secondary | ICD-10-CM | POA: Diagnosis not present

## 2018-11-28 DIAGNOSIS — R300 Dysuria: Secondary | ICD-10-CM | POA: Diagnosis not present

## 2018-11-28 DIAGNOSIS — Z978 Presence of other specified devices: Secondary | ICD-10-CM

## 2018-11-28 DIAGNOSIS — R1030 Lower abdominal pain, unspecified: Secondary | ICD-10-CM | POA: Diagnosis not present

## 2018-11-28 DIAGNOSIS — R1084 Generalized abdominal pain: Secondary | ICD-10-CM | POA: Diagnosis not present

## 2018-11-28 LAB — CBC WITH DIFFERENTIAL/PLATELET
Abs Immature Granulocytes: 0.03 10*3/uL (ref 0.00–0.07)
Basophils Absolute: 0.1 10*3/uL (ref 0.0–0.1)
Basophils Relative: 1 %
Eosinophils Absolute: 0.5 10*3/uL (ref 0.0–0.5)
Eosinophils Relative: 4 %
HCT: 46.1 % (ref 39.0–52.0)
Hemoglobin: 15.1 g/dL (ref 13.0–17.0)
Immature Granulocytes: 0 %
Lymphocytes Relative: 28 %
Lymphs Abs: 3.6 10*3/uL (ref 0.7–4.0)
MCH: 29.7 pg (ref 26.0–34.0)
MCHC: 32.8 g/dL (ref 30.0–36.0)
MCV: 90.6 fL (ref 80.0–100.0)
Monocytes Absolute: 1.1 10*3/uL — ABNORMAL HIGH (ref 0.1–1.0)
Monocytes Relative: 9 %
Neutro Abs: 7.5 10*3/uL (ref 1.7–7.7)
Neutrophils Relative %: 58 %
Platelets: 345 10*3/uL (ref 150–400)
RBC: 5.09 MIL/uL (ref 4.22–5.81)
RDW: 12.9 % (ref 11.5–15.5)
WBC: 12.9 10*3/uL — ABNORMAL HIGH (ref 4.0–10.5)
nRBC: 0 % (ref 0.0–0.2)

## 2018-11-28 LAB — URINALYSIS, COMPLETE (UACMP) WITH MICROSCOPIC
Bilirubin Urine: NEGATIVE
Glucose, UA: NEGATIVE mg/dL
Hgb urine dipstick: NEGATIVE
Ketones, ur: NEGATIVE mg/dL
Nitrite: POSITIVE — AB
Protein, ur: 100 mg/dL — AB
Specific Gravity, Urine: 1.014 (ref 1.005–1.030)
Squamous Epithelial / HPF: NONE SEEN (ref 0–5)
WBC, UA: >50 WBC/hpf — ABNORMAL HIGH (ref 0–5)
pH: 7 (ref 5.0–8.0)

## 2018-11-28 LAB — BASIC METABOLIC PANEL
Anion gap: 9 (ref 5–15)
BUN: 19 mg/dL (ref 6–20)
CO2: 25 mmol/L (ref 22–32)
Calcium: 9 mg/dL (ref 8.9–10.3)
Chloride: 106 mmol/L (ref 98–111)
Creatinine, Ser: 0.88 mg/dL (ref 0.61–1.24)
GFR calc Af Amer: >60 mL/min (ref >60)
GFR calc non Af Amer: >60 mL/min (ref >60)
Glucose, Bld: 100 mg/dL — ABNORMAL HIGH (ref 70–99)
Potassium: 4.5 mmol/L (ref 3.5–5.1)
Sodium: 140 mmol/L (ref 135–145)

## 2018-11-28 MED ORDER — CEPHALEXIN 500 MG PO CAPS: 500.0000 mg | ORAL_CAPSULE | Freq: Two times a day (BID) | ORAL | 0 refills | Status: AC

## 2018-11-28 MED ORDER — OXYCODONE-ACETAMINOPHEN 5-325 MG PO TABS
1.0000 | ORAL_TABLET | Freq: Once | ORAL | Status: AC
  Administered 2018-11-28: 20:00:00 1 via ORAL
  Filled 2018-11-28: qty 1

## 2018-11-28 MED ORDER — SODIUM CHLORIDE 0.9 % IV SOLN
1.0000 g | Freq: Once | INTRAVENOUS | Status: AC
  Administered 2018-11-28: 1 g via INTRAVENOUS
  Filled 2018-11-28: qty 10
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
## 2018-11-28 NOTE — ED Notes (Signed)
MD at bedside to update on test results. Pt verbalized understanding.

## 2018-11-28 NOTE — ED Triage Notes (Signed)
PT to ED from home reporting a possible UTI. Pt presents with a cath that he reports was changed two days ago. Pelvic pain since that has worsened in the last hour. Decreased output noted in urine bag per pt and pt states this feels similar to past UTIs.   Hx of MS and chronic foley catheter placement. PT is alert and in NAD at time of arrival.

## 2018-11-29 DIAGNOSIS — R279 Unspecified lack of coordination: Secondary | ICD-10-CM | POA: Diagnosis not present

## 2018-11-29 DIAGNOSIS — Z743 Need for continuous supervision: Secondary | ICD-10-CM | POA: Diagnosis not present

## 2018-11-29 DIAGNOSIS — R5381 Other malaise: Secondary | ICD-10-CM | POA: Diagnosis not present

## 2018-11-29 NOTE — ED Notes (Addendum)
[facility] taking pt back home. Pt unable to sign for DC d/t being in hallway and hx MS

## 2018-12-01 LAB — URINE CULTURE: Culture: >=100000 — AB

## 2018-12-14 DIAGNOSIS — L89152 Pressure ulcer of sacral region, stage 2: Secondary | ICD-10-CM | POA: Diagnosis not present

## 2018-12-15 DIAGNOSIS — L8992 Pressure ulcer of unspecified site, stage 2: Secondary | ICD-10-CM | POA: Diagnosis not present

## 2018-12-15 DIAGNOSIS — G35 Multiple sclerosis: Secondary | ICD-10-CM | POA: Diagnosis not present

## 2018-12-15 DIAGNOSIS — L89309 Pressure ulcer of unspecified buttock, unspecified stage: Secondary | ICD-10-CM | POA: Diagnosis not present

## 2018-12-24 DIAGNOSIS — L89152 Pressure ulcer of sacral region, stage 2: Secondary | ICD-10-CM | POA: Diagnosis not present

## 2018-12-28 ENCOUNTER — Encounter: Payer: Medicare Other | Admitting: Family Medicine

## 2019-01-02 ENCOUNTER — Telehealth: Payer: Self-pay | Admitting: Family Medicine

## 2019-01-02 NOTE — Telephone Encounter (Signed)
Patient's niece,Kathy,called.  She's staying with patient and taking care of him.  Kathy's job was closed down on 12/23/18 due to several employees having covid.  Juliann Pulse has symptoms and she tried to stay away from patient.  Juliann Pulse hasn't been tested and she wants to know if patient should be tested for covid. Patient isn't having symptoms.

## 2019-01-03 NOTE — Telephone Encounter (Signed)
Juliann Pulse notified as instructed by telephone.  She states she went to [facility] today to be tested and the line was so long that she left.  She states she will try again tomorrow and just get there really early.

## 2019-01-03 NOTE — Telephone Encounter (Signed)
Would be easier to have Travis Cantrell tested as she is mobile and symptomatic.  If she is positive .. pt should be tested or if he becomes symptomatic

## 2019-01-03 NOTE — Telephone Encounter (Signed)
Tried to call Juliann Pulse.  No answer.  Unable to leave a message.

## 2019-01-11 ENCOUNTER — Encounter: Payer: Self-pay | Admitting: Family Medicine

## 2019-01-11 ENCOUNTER — Ambulatory Visit (INDEPENDENT_AMBULATORY_CARE_PROVIDER_SITE_OTHER): Payer: Medicare Other | Admitting: Family Medicine

## 2019-01-11 ENCOUNTER — Other Ambulatory Visit: Payer: Self-pay

## 2019-01-11 VITALS — BP 138/98 | HR 83 | Temp 97.7°F

## 2019-01-11 DIAGNOSIS — Z0000 Encounter for general adult medical examination without abnormal findings: Secondary | ICD-10-CM | POA: Diagnosis not present

## 2019-01-11 DIAGNOSIS — G35 Multiple sclerosis: Secondary | ICD-10-CM

## 2019-01-11 DIAGNOSIS — E7800 Pure hypercholesterolemia, unspecified: Secondary | ICD-10-CM

## 2019-01-11 DIAGNOSIS — I10 Essential (primary) hypertension: Secondary | ICD-10-CM

## 2019-01-11 DIAGNOSIS — F331 Major depressive disorder, recurrent, moderate: Secondary | ICD-10-CM

## 2019-01-11 DIAGNOSIS — Z23 Encounter for immunization: Secondary | ICD-10-CM

## 2019-01-11 DIAGNOSIS — R7303 Prediabetes: Secondary | ICD-10-CM | POA: Diagnosis not present

## 2019-01-11 DIAGNOSIS — G500 Trigeminal neuralgia: Secondary | ICD-10-CM

## 2019-01-11 DIAGNOSIS — Z125 Encounter for screening for malignant neoplasm of prostate: Secondary | ICD-10-CM

## 2019-01-11 DIAGNOSIS — N319 Neuromuscular dysfunction of bladder, unspecified: Secondary | ICD-10-CM

## 2019-01-11 NOTE — Progress Notes (Signed)
Chief Complaint  Patient presents with  . Medicare Wellness    History of Present Illness: HPI   The patient presents for annual medicare wellness, complete physical and review of chronic health problems. He/She also has the following acute concerns today: none  I have personally reviewed the Medicare Annual Wellness questionnaire and have noted 1. The patient's medical and social history 2. Their use of alcohol, tobacco or illicit drugs 3. Their current medications and supplements 4. The patient's functional ability including ADL's, fall risks, home safety risks and hearing or visual             impairment. 5. Diet and physical activities 6. Evidence for depression or mood disorders 7.         Updated provider list The patients weight, height, BMI and visual acuity have been recorded in the chart I have made referrals, counseling and provided education to the patient based review of the above and I have provided the pt with a written personalized care plan for preventive services.  Documentation of this information was scanned into the electronic record under the media tab.   Advance directives and end of life planning reviewed in detail with patient and documented in EMR. Patient given handout on advance care directives if needed. HCPOA and living will updated if needed.   Hearing Screening   Method: Audiometry   125Hz  250Hz  500Hz  1000Hz  2000Hz  3000Hz  4000Hz  6000Hz  8000Hz   Right ear:   20 20 20  20     Left ear:   20 20 20   40      Fall Risk  01/11/2019 12/01/2017 05/31/2016 05/18/2016 05/04/2016  Falls in the past year? 0 0 (No Data) (No Data) (No Data)  Comment - - No report of new falls, per report of daughter , on 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 He stopped given not helping Gabapentin helps with contracture pain.  he is planning on getting the B  braces for contractures in upper extremities.  Trigeminal neuralgia due to MS:  On trileptal.  Elevated Cholesterol: Due for re-eval on lipitor 40 mg dialy Using medications without problems: Muscle aches:  Diet compliance: moderate Exercise: unable Other complaints:  Prediabetes:  Due for re-eval   01/13/2019 on fluoxetine.  Hypertension:   recent control on lisinopril BP Readings from Last 3 Encounters:  01/11/19 (!) 138/98  11/28/18 90/66  09/03/18 97/73  Using medication without problems or lightheadedness: none Chest pain with exertion:none Edema:none Short of breath:none Average home BPs: Other issues:   This visit occurred during the SARS-CoV-2 public health emergency.  Safety protocols were in place, including screening questions prior to the visit, additional usage of staff PPE, and extensive cleaning of exam room while observing appropriate contact time as indicated for disinfecting solutions.   COVID 19 screen:  No recent travel or known exposure to COVID19 The patient denies respiratory symptoms of COVID 19 at this time. The importance of social distancing was discussed today.  Review of Systems  Constitutional: Negative for chills and fever.  HENT: Negative for congestion and ear pain.   Eyes: Negative for pain and redness.  Respiratory: Negative for cough and shortness of breath.   Cardiovascular: Negative for chest pain, palpitations and leg swelling.  Gastrointestinal: Negative for abdominal pain, blood in stool, constipation, diarrhea, nausea and vomiting.  Genitourinary: Negative for dysuria.  Musculoskeletal: Positive for myalgias. Negative for  falls.  Skin: Negative for rash.  Neurological: Negative for dizziness.  Psychiatric/Behavioral: Negative for depression. The patient is not nervous/anxious.       Past Medical History:  Diagnosis Date  . Allergy   . Decubitus ulcer   . GERD (gastroesophageal reflux disease)   . Hypertension   . MS (multiple sclerosis) ([place])     reports that he has quit smoking. He has never used smokeless tobacco. He reports that he does not drink alcohol or use drugs.   Current Outpatient Medications:  .  albuterol (PROVENTIL) (2.5 MG/3ML) 0.083% nebulizer solution, Take 3 mLs (2.5 mg total) by nebulization every 4 (four) hours as needed for wheezing or shortness of breath., Disp: 75 mL, Rfl: 11 .  atorvastatin (LIPITOR) 40 MG tablet, Take 40 mg by mouth daily., Disp: , Rfl:  .  bisacodyl (DULCOLAX) 10 MG suppository, Place 1 suppository (10 mg total) rectally as needed for moderate constipation., Disp: 12 suppository, Rfl: 0 .  bisacodyl (DULCOLAX) 5 MG EC tablet, Take 1 tablet (5 mg total) by mouth daily as needed for moderate constipation., Disp: 30 tablet, Rfl: 0 .  butalbital-acetaminophen-caffeine (FIORICET, ESGIC) 50-325-40 MG tablet, Take 1 tablet by mouth every 6 (six) hours as needed for headache or migraine., Disp: , Rfl:  .  Catheters (DOVER HYDROGEL FOLEY CATH 18FR) MISC, Change cathter every 2 weeks, Disp: 12 each, Rfl: 11 .  Cholecalciferol (VITAMIN D3) 2000 units TABS, Take 4,000 Units by mouth daily., Disp: , Rfl:  .  CRANBERRY PO, Take 1 capsule by mouth daily., Disp: , Rfl:  .  diazepam (VALIUM) 5 MG tablet, TAKE 1 TABLET BY MOUTH EVERY 12 HOURS AS NEEDED, Disp: 60 tablet, Rfl: 0 .  FLUoxetine (PROZAC) 20 MG capsule, TAKE 2 CAPSULES BY MOUTH IN THE MORNING AND 1 IN THE EVENING, Disp: 270 capsule, Rfl: 1 .  ibuprofen (ADVIL) 800 MG tablet, TAKE 1 TABLET BY MOUTH EVERY 8 HOURS AS NEEDED, Disp: 90 tablet, Rfl: 3 .  lisinopril (PRINIVIL,ZESTRIL) 20 MG tablet, TAKE 1 TABLET BY MOUTH ONCE  DAILY (Patient taking differently: Take 20 mg by mouth daily. ), Disp: 90 tablet, Rfl: 1 .  loratadine (CLARITIN) 10 MG tablet, Take 10 mg by mouth daily as needed for allergies., Disp: , Rfl:  .  Multiple Vitamin (MULTIVITAMIN WITH MINERALS) TABS tablet, Take 1 tablet by mouth daily., Disp: , Rfl:  .  NYSTATIN powder, APPLY  POWDER TOPICALLY TO RASH 4 TIMES DAILY, Disp: 60 g, Rfl: 0 .  omeprazole (PRILOSEC) 20 MG capsule, Take 1 capsule (20 mg total) by mouth 2 (two) times daily before a meal., Disp: , Rfl:  .  Oxcarbazepine (TRILEPTAL) 300 MG tablet, Take 1 tablet (300 mg total) by mouth 2 (two) times daily., Disp: 60 tablet, Rfl: 0 .  polyethylene glycol (MIRALAX / GLYCOLAX) packet, Take 17 g by mouth 2 (two) times daily., Disp: 14 each, Rfl: 0 .  traMADol (ULTRAM) 50 MG tablet, Take 1 tablet (50 mg total) by mouth every 6 (six) hours as needed for moderate pain., Disp: ,  Rfl:    Observations/Objective: Blood pressure (!) 138/98, pulse 83, temperature 97.7 F (36.5 C), temperature source Temporal, SpO2 97 %.  Physical Exam Constitutional:      General: He is not in acute distress.    Appearance: Normal appearance. He is not ill-appearing or toxic-appearing.     Comments: Wheelchair bound  HENT:     Head: Normocephalic and atraumatic.     Right Ear: Hearing, tympanic membrane, ear canal and external ear normal. No tenderness. No foreign body. Tympanic membrane is not retracted or bulging.     Left Ear: Hearing, tympanic membrane, ear canal and external ear normal. No tenderness. No foreign body. Tympanic membrane is not retracted or bulging.     Nose: Nose normal. No mucosal edema or rhinorrhea.     Right Sinus: No maxillary sinus tenderness or frontal sinus tenderness.     Left Sinus: No maxillary sinus tenderness or frontal sinus tenderness.     Mouth/Throat:     Dentition: Normal dentition. No dental caries.     Pharynx: Uvula midline. No oropharyngeal exudate.     Tonsils: No  tonsillar abscesses.  Eyes:     General: Lids are normal. Lids are everted, no foreign bodies appreciated.     Conjunctiva/sclera: Conjunctivae normal.     Pupils: Pupils are equal, round, and reactive to light.  Neck:     Thyroid: No thyroid mass or thyromegaly.     Vascular: No carotid bruit.     Trachea: Trachea and phonation normal.     Comments: Neck movement at baseline Cardiovascular:     Rate and Rhythm: Normal rate and regular rhythm.     Pulses: Normal pulses.     Heart sounds: Normal heart sounds, S1 normal and S2 normal. No murmur. No gallop.   Pulmonary:     Effort: Pulmonary effort is normal. No respiratory distress.     Breath sounds: Normal breath sounds. No wheezing, rhonchi or rales.  Abdominal:     General: Bowel sounds are normal.     Palpations: Abdomen is soft.     Tenderness: There is no abdominal tenderness. There is no guarding or rebound.     Hernia: No hernia is present.  Musculoskeletal:     Cervical back: No rigidity. Decreased range of motion.     Comments: Bilateral arm, wrist and hand contractures    Skin:    General: Skin is warm and dry.     Findings: No rash.  Neurological:     Mental Status: He is alert.     Sensory: Sensory deficit present.     Motor: Weakness and atrophy present.     Deep Tendon Reflexes: Reflexes are normal and symmetric.     Comments:  Wheelchair bound  Psychiatric:        Speech: Speech normal.        Behavior: Behavior normal.        Judgment: Judgment normal.      Assessment and Plan   The patient's preventative maintenance and recommended screening tests for an annual wellness exam were reviewed in full today. Brought up to date unless services declined.  Counselled on the importance of diet, exercise, and its role in overall health and mortality. The patient's FH and SH was reviewed, including their home life, tobacco status, and drug and alcohol status.   Vaccines: Got flu shot today Prostate Cancer  Screen: he requests having this checked.. due for re-eval Lab Results  Component Value Date  PSA 1.08 12/01/2017   PSA 1.21 04/24/2015   PSA 0.61 06/22/2011        Colon Cancer Screen:   He requests this screening... ifob neg 09/2018      Smoking Status:none ETOH/ drug VHQ:IONG/EXBMuse:none/none Hep C: done HIV screen:  done  Vaccines: given flu shot. Consider td.  Multiple sclerosis, primary progressive  Unchanged. Symptoms managed well. Followed by neuro. Wheelchair bound, severe contractures present.  Neurogenic bladder Foley cathter in place.   Trigeminal neuralgia of left side of face Stable control on trileptal.  Essential hypertension, benign Well controlled. Continue current medication.   High cholesterol Due for re-eval.    Kerby NoraAmy Josedaniel Haye, MD

## 2019-01-11 NOTE — Patient Instructions (Signed)
IN next 2-3 months.. have fasting lab only visit.

## 2019-01-12 ENCOUNTER — Other Ambulatory Visit: Payer: Self-pay | Admitting: Family Medicine

## 2019-01-14 ENCOUNTER — Other Ambulatory Visit: Payer: Self-pay | Admitting: Family Medicine

## 2019-01-14 ENCOUNTER — Telehealth: Payer: Self-pay | Admitting: Family Medicine

## 2019-01-14 NOTE — Telephone Encounter (Signed)
Please check with Travis Cantrell.  She messaged me earlier today about this letter.

## 2019-01-14 NOTE — Telephone Encounter (Signed)
Patient's niece called stating she received a call from our office in regards to her uncle and a letter getting done.  I did not see anything documented

## 2019-01-14 NOTE — Telephone Encounter (Signed)
Last office visit 01/11/2019 for [place].  Last refilled 10/25/2018 for #60 with no refills.  No future appointments.

## 2019-01-15 ENCOUNTER — Telehealth: Payer: Self-pay | Admitting: Family Medicine

## 2019-01-15 NOTE — Telephone Encounter (Signed)
Spoke with Dawn at [employer] and advised that we did not do measurements here because Dr. Diona Browner was not sure what kind of measurements needed to be done.  Dawn will try to get Mr. Deerman scheduled.

## 2019-01-15 NOTE — Telephone Encounter (Signed)
Dawn with [facility] called in regards to Mr Travis Cantrell.  She spoke with his niece and the niece thought that the office took the measurements for braces for his hands/wrist. Cala Bradford has scheduled the patient twice for him to come in to see them and he no showed both time. Dawn stated the niece thought that [facility] was just a store that she could pick the braces up at she didn't realize he needed to be seen there.   Dawn wanted to know if we did indeed take measurements and if so could we fax them to the [facility]  Ph- [phone] Fax- [phone]

## 2019-01-15 NOTE — Telephone Encounter (Signed)
Orders re-faxed to [facility] in [place] at [phone].  Unable to reach Juliann Pulse (niece) by telephone to let her know this has been resent.  No answer and no voicemail.

## 2019-01-15 NOTE — Telephone Encounter (Signed)
Butch Penny, this is regarding a DME order that you faxed to [facility].  I was not involved in the process of this order (I only do referrals no DME).  Please contact pt's niece/[facility] for info they require.

## 2019-01-15 NOTE — Telephone Encounter (Signed)
Patient's niece called today in regards to a order that was needing to be sent over to [facility] for the patient to receive his braces for his arm She spoke with that and they stated that they have no received anything from our office in order to get this ordered for the patient. That our office has the patient's measurements etc. Patient's niece stated that we would need to refax these to them before they can get the braces for the patient  Please advise

## 2019-01-23 DIAGNOSIS — L89152 Pressure ulcer of sacral region, stage 2: Secondary | ICD-10-CM | POA: Diagnosis not present

## 2019-02-06 NOTE — Assessment & Plan Note (Signed)
Stable control on trileptal.

## 2019-02-06 NOTE — Assessment & Plan Note (Signed)
Foley cathter in place.

## 2019-02-06 NOTE — Assessment & Plan Note (Signed)
Well controlled. Continue current medication.  

## 2019-02-06 NOTE — Assessment & Plan Note (Signed)
Due for re-eval. 

## 2019-02-06 NOTE — Assessment & Plan Note (Signed)
Unchanged. Symptoms managed well. Followed by neuro. Wheelchair bound, severe contractures present.

## 2019-02-06 NOTE — Assessment & Plan Note (Signed)
Stable on fluoxetine

## 2019-02-14 ENCOUNTER — Telehealth: Payer: Self-pay | Admitting: Family Medicine

## 2019-02-14 NOTE — Telephone Encounter (Signed)
Spoke with [facility]. They will need a written Rx order faxed to [phone].  I will put old order in your in box to go by for writing new order.

## 2019-02-14 NOTE — Telephone Encounter (Signed)
Patient's niece called today in regards to the patient's catheters.  She stated he gets a prescription from [facility]  For 2 a month. They would like to know if this could be changed to them getting 3 a month instead   Please advise  Call back- [phone]

## 2019-02-14 NOTE — Telephone Encounter (Signed)
Okay to send in as requested for 3 a month

## 2019-02-15 ENCOUNTER — Encounter: Payer: Self-pay | Admitting: Family Medicine

## 2019-02-15 ENCOUNTER — Ambulatory Visit (INDEPENDENT_AMBULATORY_CARE_PROVIDER_SITE_OTHER): Payer: Self-pay | Admitting: Family Medicine

## 2019-02-15 DIAGNOSIS — R197 Diarrhea, unspecified: Secondary | ICD-10-CM

## 2019-02-15 NOTE — Telephone Encounter (Signed)
Order faxed to [facility] at [phone].

## 2019-02-15 NOTE — Telephone Encounter (Signed)
IN your box in my office

## 2019-02-18 ENCOUNTER — Telehealth: Payer: Self-pay | Admitting: Family Medicine

## 2019-02-18 ENCOUNTER — Other Ambulatory Visit: Payer: Medicare Other

## 2019-02-18 ENCOUNTER — Ambulatory Visit (INDEPENDENT_AMBULATORY_CARE_PROVIDER_SITE_OTHER): Payer: Medicare Other | Admitting: Family Medicine

## 2019-02-18 ENCOUNTER — Encounter: Payer: Self-pay | Admitting: Family Medicine

## 2019-02-18 VITALS — BP 172/88 | Temp 97.5°F

## 2019-02-18 DIAGNOSIS — G8250 Quadriplegia, unspecified: Secondary | ICD-10-CM | POA: Diagnosis not present

## 2019-02-18 DIAGNOSIS — N390 Urinary tract infection, site not specified: Secondary | ICD-10-CM | POA: Diagnosis not present

## 2019-02-18 DIAGNOSIS — Z960 Presence of urogenital implants: Secondary | ICD-10-CM | POA: Diagnosis not present

## 2019-02-18 DIAGNOSIS — N319 Neuromuscular dysfunction of bladder, unspecified: Secondary | ICD-10-CM | POA: Diagnosis not present

## 2019-02-18 LAB — POC URINALSYSI DIPSTICK (AUTOMATED)
Bilirubin, UA: NEGATIVE
Glucose, UA: NEGATIVE
Ketones, UA: NEGATIVE
Nitrite, UA: POSITIVE
Protein, UA: POSITIVE — AB
Spec Grav, UA: 1.015 (ref 1.010–1.025)
Urobilinogen, UA: 0.2 E.U./dL
pH, UA: 8 (ref 5.0–8.0)

## 2019-02-18 MED ORDER — SULFAMETHOXAZOLE-TRIMETHOPRIM 800-160 MG PO TABS: 1.0000 | ORAL_TABLET | Freq: Two times a day (BID) | ORAL | 0 refills | Status: AC

## 2019-02-18 NOTE — Telephone Encounter (Signed)
Catheter is stopped up with mucus like, feels like he has a urinary infection. No fever. No burning sensation. Patient is waiting on the new catheters to come in also. (Note below is incorrect. No more diarrhea, that is not the issue)  Also states they were suppose to have had a video visit on 02/15/19 but states no one called to do the visit.   Niece was also stating that she spoke to someone last week about getting an antibiotic for patient's symptoms.  It was difficult to get/understand all of the information.  Dr. Daphine Deutscher patient.

## 2019-02-18 NOTE — Progress Notes (Addendum)
Travis Bango T. Demarri Elie, MD Primary Care and Sports Medicine [facility] [place], [address] Phone: [phone]  FAX: [phone]  Travis Cantrell - 59 y.o. male  MRN [id number]  Date of Birth: 03/12/1960  Visit Date: 02/18/2019  PCP: Jinny Sanders, MD  Referred by: Jinny Sanders, MD  Virtual Visit via Telephone Note:  I connected with  Travis Cantrell on 02/18/2019  3:40 PM EST by telephone and verified that I am speaking with the correct person using two identifiers.   Location patient: home phone or cell phone Location provider: work or home office Consent: Verbal consent directly obtained from Travis Cantrell and that there may be a patient responsible charge related to this service. Persons participating in the virtual visit: patient, provider  I discussed the limitations of evaluation and management by telemedicine and the availability of in person appointments.  The patient expressed understanding and agreed to proceed.     Interactive audio and video telecommunications were attempted between this provider and patient, however failed, due to patient having technical difficulties OR patient did not have access to video capability.  We continued and completed visit with audio only.    History of Present Illness:  Multiple attempts were made to try to contact the patient last Friday by Dr. Diona Browner as well as Mrs. Delsa Sale.  Unfortunately, no contact was able to be achieved.  He presents today as well as having a family member call and it be on the call as well.  Travis Cantrell thinks that he has a urinary tract infection and his urine is turbid and has a poor odor.  He does have a significant of recurrent UTI.  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  Review of Systems: pertinent positives and pertinent negatives as per HPI No acute distress verbally  Past Medical History,  Surgical History, Social History, Family History, Problem List, Medications, and Allergies have been reviewed and updated if relevant.   Observations/Objective/Exam:  An attempt was made to discern vital signs over the phone and per patient if applicable and possible.   Pulmonary:     Effort: Pulmonary effort is normal. No respiratory distress.  Neurological:     Mental Status: He is alert and oriented to person, place, and time.  Psychiatric:        Thought Content: Thought content normal.        Judgment: Judgment normal.   Results for orders placed or performed in visit on 02/18/19  POCT Urinalysis Dipstick (Automated)  Result Value Ref Range   Color, UA Yellow    Clarity, UA Cloudy    Glucose, UA Negative Negative   Bilirubin, UA Negative    Ketones, UA Negative    Spec Grav, UA 1.015 1.010 - 1.025   Blood, UA Trace    pH, UA 8.0 5.0 - 8.0   Protein, UA Positive (A) Negative   Urobilinogen, UA 0.2 0.2 or 1.0 E.U./dL   Nitrite, UA Positive    Leukocytes, UA Large (3+) (A) Negative     Assessment and Plan:    ICD-10-CM   1. Recurrent UTI  N39.0 POCT Urinalysis Dipstick (Automated)    Urine Culture  2. Presence of indwelling urinary catheter  Z96.0   3. Neurogenic bladder  N31.9   4. 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 On the  day of the patient encounter, this can include review of prior records, labs, and imaging.  Additional time can include counselling, consultation with peer MD in person or by telephone.  This also includes independent review of Radiology.  Lemond knows his body well, and I think that he most certainly has a urinary tract infection and his UA indicates this as well.  We will check a culture to ensure adequate treatment.  I discussed the assessment and treatment plan with the patient. The patient was provided an opportunity to ask questions and all were answered. The patient agreed with the plan and demonstrated an  understanding of the instructions.   The patient was advised to call back or seek an in-person evaluation if the symptoms worsen or if the condition fails to improve as anticipated.  Follow-up: prn unless noted otherwise below No follow-ups on file.  Meds ordered this encounter  Medications  . sulfamethoxazole-trimethoprim (BACTRIM DS) 800-160 MG tablet    Sig: Take 1 tablet by mouth 2 (two) times daily for 7 days.    Dispense:  14 tablet    Refill:  0   Orders Placed This Encounter  Procedures  . Urine Culture  . POCT Urinalysis Dipstick (Automated)    Signed,  Zoi Devine T. Tequita Marrs, MD

## 2019-02-18 NOTE — Telephone Encounter (Signed)
Called patient back and got his niece which started yelling stating that he was suppose to have a virtual visit Friday and no one ever contacted him. [name] that I am trying to help Travis Cantrell and if she will stop yelling and listen we can get him scheduled for a virtual visit today and have someone drop off a urine specimen for him.[name] and patient that a urine specimen needs to be checked and a culture ordered in order to know the right treatment.  [name] put the patient on the phone and he started that he can have someone drop off the urine shortly because he has a sterile urine container at home.. Patient stated that he feels that he has a UTI  because he gets them often and his urine is cloudy.Patient scheduled for a virtual visit today with Dr. Patsy Lager and lab appointment to drop urine off.

## 2019-02-18 NOTE — Telephone Encounter (Signed)
Pt's niece called stating diarrhea got worse and she is wondering if a prescription can be called in for patient.

## 2019-02-18 NOTE — Telephone Encounter (Signed)
Rene Kocher will you please call and speak with niece and get more information about [name].  Usually Dr. Ermalene Searing will not treat a UTI without a urine sample.

## 2019-02-18 NOTE — Telephone Encounter (Signed)
I attempted to call patient on Friday for his virtual visit  x 3 at [phone] which was the phone number listed for appointment.  It went to voicemail and messages were left for them to return call to office.  Catheter order was faxed to [facility] today (02/18/2019) to increase quantity to 3 catheters per month.  Will try and have [facility] call and triage patient for more information.

## 2019-02-18 NOTE — Telephone Encounter (Signed)
Thank you for seeing him. In addition to Lupita Leash trying to contact him Friday I sent 2 doxy links and called as well. I am not sure what the connection issue is if the numbers are correct.

## 2019-02-18 NOTE — Telephone Encounter (Signed)
I will see him this afternoon. °

## 2019-02-19 LAB — URINE CULTURE
MICRO NUMBER:: 10076785
SPECIMEN QUALITY:: ADEQUATE

## 2019-02-25 DIAGNOSIS — L89152 Pressure ulcer of sacral region, stage 2: Secondary | ICD-10-CM | POA: Diagnosis not present

## 2019-02-26 DIAGNOSIS — L89152 Pressure ulcer of sacral region, stage 2: Secondary | ICD-10-CM | POA: Diagnosis not present

## 2019-02-27 ENCOUNTER — Other Ambulatory Visit: Payer: Self-pay | Admitting: Family Medicine

## 2019-03-06 DIAGNOSIS — M24531 Contracture, right wrist: Secondary | ICD-10-CM | POA: Diagnosis not present

## 2019-03-06 DIAGNOSIS — M24532 Contracture, left wrist: Secondary | ICD-10-CM | POA: Diagnosis not present

## 2019-03-06 DIAGNOSIS — M24522 Contracture, left elbow: Secondary | ICD-10-CM | POA: Diagnosis not present

## 2019-03-06 DIAGNOSIS — M24521 Contracture, right elbow: Secondary | ICD-10-CM | POA: Diagnosis not present

## 2019-03-19 ENCOUNTER — Other Ambulatory Visit: Payer: Self-pay

## 2019-03-19 ENCOUNTER — Other Ambulatory Visit (INDEPENDENT_AMBULATORY_CARE_PROVIDER_SITE_OTHER): Payer: Medicare Other

## 2019-03-19 ENCOUNTER — Other Ambulatory Visit: Payer: Self-pay | Admitting: Family Medicine

## 2019-03-19 ENCOUNTER — Telehealth: Payer: Self-pay | Admitting: Family Medicine

## 2019-03-19 DIAGNOSIS — N390 Urinary tract infection, site not specified: Secondary | ICD-10-CM | POA: Diagnosis not present

## 2019-03-19 LAB — POC URINALSYSI DIPSTICK (AUTOMATED)
Bilirubin, UA: NEGATIVE
Glucose, UA: NEGATIVE
Ketones, UA: NEGATIVE
Nitrite, UA: POSITIVE
Protein, UA: POSITIVE — AB
Spec Grav, UA: 1.02 (ref 1.010–1.025)
Urobilinogen, UA: 0.2 E.U./dL
pH, UA: 6.5 (ref 5.0–8.0)

## 2019-03-19 MED ORDER — CEPHALEXIN 500 MG PO CAPS: 500.0000 mg | ORAL_CAPSULE | Freq: Three times a day (TID) | ORAL | 0 refills | Status: DC

## 2019-03-19 NOTE — Telephone Encounter (Signed)
Jceon's niece notified as instructed by telephone.  I did instruct her not to get urine sample from bag but to get is directly from his catheter.

## 2019-03-19 NOTE — Telephone Encounter (Signed)
Need to bring in a cath urine sample to send for UA, micro and culture ASAP

## 2019-03-19 NOTE — Telephone Encounter (Signed)
Urine results have not been reviewed yet by Dr. Ermalene Searing.  If niece calls back, let her know she will be notified if and when a prescription has been sent in.

## 2019-03-19 NOTE — Telephone Encounter (Signed)
Called both work and mobile numbers, unable to reach patient.  Left general message on work number but did leave details on cell number as permitted by D[employer]. Informed patient abx sent in and to call us tomorrow if any questions or concerns.   Thanks.

## 2019-03-19 NOTE — Progress Notes (Signed)
Rx sent to pharmacy   

## 2019-03-19 NOTE — Telephone Encounter (Signed)
Patient's niece called back.  Patient has burning with urination. Niece is concerned she's very thorough and clean when she inserts catheter, but she wants to make sure she's not causing him to have a UTI.  Patient uses [facility] [address]. Please call niece [phone] when rx is called in to pharmacy.

## 2019-03-19 NOTE — Telephone Encounter (Signed)
Patient called today stating that he believes he may have another UTI. Patient said he was just tested for this 2 months ago and would like to know if the same prescription could be sent into his pharmacy for him Please advise     PHONE- [phone]

## 2019-03-19 NOTE — Telephone Encounter (Signed)
Patient's Niece called back to see if medication has been sent

## 2019-03-19 NOTE — Telephone Encounter (Signed)
Patient's niece called to find out if she needs to go and pick up medication for patient.

## 2019-03-19 NOTE — Telephone Encounter (Signed)
Micro showed white blood cells. Urine culture pending. Will call in antibiotics for possible UTI... please tel pt or niece.

## 2019-03-19 NOTE — Telephone Encounter (Signed)
Patient's niece called back checking on this again, just wanted to check on this message

## 2019-03-20 LAB — URINE CULTURE
MICRO NUMBER:: 10178454
SPECIMEN QUALITY:: ADEQUATE

## 2019-03-26 DIAGNOSIS — L89152 Pressure ulcer of sacral region, stage 2: Secondary | ICD-10-CM | POA: Diagnosis not present

## 2019-04-01 DIAGNOSIS — L89152 Pressure ulcer of sacral region, stage 2: Secondary | ICD-10-CM | POA: Diagnosis not present

## 2019-04-26 DIAGNOSIS — L89152 Pressure ulcer of sacral region, stage 2: Secondary | ICD-10-CM | POA: Diagnosis not present

## 2019-05-01 ENCOUNTER — Other Ambulatory Visit: Payer: Self-pay | Admitting: Family Medicine

## 2019-05-01 NOTE — Telephone Encounter (Signed)
Last office visit 02/18/2019 for recurrent UTI with Dr. Patsy Lager.  Last refilled 10/23/2018 for #90 with 3 refills.  No future appointments.

## 2019-05-06 ENCOUNTER — Emergency Department (HOSPITAL_COMMUNITY): Payer: Medicare Other

## 2019-05-06 ENCOUNTER — Encounter (HOSPITAL_COMMUNITY): Payer: Self-pay | Admitting: Emergency Medicine

## 2019-05-06 ENCOUNTER — Emergency Department (HOSPITAL_COMMUNITY)
Admission: EM | Admit: 2019-05-06 | Discharge: 2019-05-07 | Disposition: A | Discharge status: Home / Self Care | Payer: Medicare Other | Attending: Emergency Medicine | Admitting: Emergency Medicine

## 2019-05-06 ENCOUNTER — Other Ambulatory Visit: Payer: Self-pay

## 2019-05-06 DIAGNOSIS — G35 Multiple sclerosis: Secondary | ICD-10-CM | POA: Diagnosis not present

## 2019-05-06 DIAGNOSIS — Z743 Need for continuous supervision: Secondary | ICD-10-CM | POA: Diagnosis not present

## 2019-05-06 DIAGNOSIS — M25512 Pain in left shoulder: Secondary | ICD-10-CM | POA: Diagnosis not present

## 2019-05-06 DIAGNOSIS — I10 Essential (primary) hypertension: Secondary | ICD-10-CM | POA: Insufficient documentation

## 2019-05-06 DIAGNOSIS — M19012 Primary osteoarthritis, left shoulder: Secondary | ICD-10-CM | POA: Diagnosis not present

## 2019-05-06 DIAGNOSIS — Z87891 Personal history of nicotine dependence: Secondary | ICD-10-CM | POA: Insufficient documentation

## 2019-05-06 DIAGNOSIS — R29898 Other symptoms and signs involving the musculoskeletal system: Secondary | ICD-10-CM | POA: Diagnosis not present

## 2019-05-06 DIAGNOSIS — Z79899 Other long term (current) drug therapy: Secondary | ICD-10-CM | POA: Insufficient documentation

## 2019-05-06 DIAGNOSIS — M25519 Pain in unspecified shoulder: Secondary | ICD-10-CM | POA: Diagnosis not present

## 2019-05-06 DIAGNOSIS — R531 Weakness: Secondary | ICD-10-CM | POA: Diagnosis not present

## 2019-05-06 LAB — URINALYSIS, ROUTINE W REFLEX MICROSCOPIC
Bilirubin Urine: NEGATIVE
Glucose, UA: NEGATIVE mg/dL
Ketones, ur: NEGATIVE mg/dL
Nitrite: POSITIVE — AB
Protein, ur: 100 mg/dL — AB
Specific Gravity, Urine: 1.016 (ref 1.005–1.030)
WBC, UA: >50 WBC/hpf — ABNORMAL HIGH (ref 0–5)
pH: 6 (ref 5.0–8.0)

## 2019-05-06 LAB — CBC WITH DIFFERENTIAL/PLATELET
Abs Immature Granulocytes: 0.04 10*3/uL (ref 0.00–0.07)
Basophils Absolute: 0.1 10*3/uL (ref 0.0–0.1)
Basophils Relative: 1 %
Eosinophils Absolute: 0.4 10*3/uL (ref 0.0–0.5)
Eosinophils Relative: 3 %
HCT: 47.1 % (ref 39.0–52.0)
Hemoglobin: 15.1 g/dL (ref 13.0–17.0)
Immature Granulocytes: 0 %
Lymphocytes Relative: 17 %
Lymphs Abs: 2.4 10*3/uL (ref 0.7–4.0)
MCH: 29.5 pg (ref 26.0–34.0)
MCHC: 32.1 g/dL (ref 30.0–36.0)
MCV: 92.2 fL (ref 80.0–100.0)
Monocytes Absolute: 1 10*3/uL (ref 0.1–1.0)
Monocytes Relative: 7 %
Neutro Abs: 10.3 10*3/uL — ABNORMAL HIGH (ref 1.7–7.7)
Neutrophils Relative %: 72 %
Platelets: 330 10*3/uL (ref 150–400)
RBC: 5.11 MIL/uL (ref 4.22–5.81)
RDW: 13.2 % (ref 11.5–15.5)
WBC: 14.1 10*3/uL — ABNORMAL HIGH (ref 4.0–10.5)
nRBC: 0 % (ref 0.0–0.2)

## 2019-05-06 LAB — COMPREHENSIVE METABOLIC PANEL
ALT: 9 U/L (ref 0–44)
AST: 26 U/L (ref 15–41)
Albumin: 3.3 g/dL — ABNORMAL LOW (ref 3.5–5.0)
Alkaline Phosphatase: 84 U/L (ref 38–126)
Anion gap: 7 (ref 5–15)
BUN: 27 mg/dL — ABNORMAL HIGH (ref 6–20)
CO2: 22 mmol/L (ref 22–32)
Calcium: 8.2 mg/dL — ABNORMAL LOW (ref 8.9–10.3)
Chloride: 111 mmol/L (ref 98–111)
Creatinine, Ser: 0.89 mg/dL (ref 0.61–1.24)
GFR calc Af Amer: >60 mL/min (ref >60)
GFR calc non Af Amer: >60 mL/min (ref >60)
Glucose, Bld: 86 mg/dL (ref 70–99)
Potassium: 5 mmol/L (ref 3.5–5.1)
Sodium: 140 mmol/L (ref 135–145)
Total Bilirubin: 1.7 mg/dL — ABNORMAL HIGH (ref 0.3–1.2)
Total Protein: 6.4 g/dL — ABNORMAL LOW (ref 6.5–8.1)

## 2019-05-06 MED ORDER — HYDROCODONE-ACETAMINOPHEN 5-325 MG PO TABS: 1.0000 | ORAL_TABLET | Freq: Four times a day (QID) | ORAL | 0 refills | Status: DC | PRN

## 2019-05-06 MED ORDER — OXYCODONE-ACETAMINOPHEN 5-325 MG PO TABS
1.0000 | ORAL_TABLET | Freq: Once | ORAL | Status: AC
  Administered 2019-05-06: 19:00:00 1 via ORAL
  Filled 2019-05-06: qty 1

## 2019-05-06 MED ORDER — DEXAMETHASONE SODIUM PHOSPHATE 10 MG/ML IJ SOLN
10.0000 mg | Freq: Once | INTRAMUSCULAR | Status: AC
  Administered 2019-05-06: 10 mg via INTRAVENOUS
  Filled 2019-05-06: qty 1

## 2019-05-06 MED ORDER — LORAZEPAM 2 MG/ML IJ SOLN
INTRAMUSCULAR | Status: AC
  Filled 2019-05-06: qty 1

## 2019-05-06 NOTE — ED Notes (Signed)
Patient contact Travis Cantrell will pick up the patient at discharge. Phone # [phone]

## 2019-05-06 NOTE — ED Triage Notes (Signed)
Patient is from home and presents with left shoulder pain, weakness lack of appetite and cloudy dark urine. According to EMS patient is warm to the touch. He reports the left shoulder pain has worsened over the past 2 weeks.  Patient has a catheter and is bedridden. HX. MS, paraplegia, stroke.     EMS vitals: 99.3 Temp 154/108 BP 88 HR 20 RR 100% O2 sat on 2L O2 102 CBG

## 2019-05-06 NOTE — ED Notes (Signed)
Pt given water 

## 2019-05-06 NOTE — ED Notes (Signed)
PTAR and family called.  
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
## 2019-05-06 NOTE — ED Notes (Signed)
Pt denies needing anything at this time.

## 2019-05-06 NOTE — Discharge Instructions (Addendum)
You were seen in the emergency department today for shoulder pain.  Your x-ray did show some findings of arthritis which may be contributing to your symptoms.  Your labs look similar to prior labs you have had done, please be sure to drink plenty of water and stay well-hydrated.  You were given a shot of Decadron, steroid to help with pain and inflammation in the shoulder.  We are also sending you home with Norco to help with pain.  -Norco-this is a narcotic/controlled substance medication that has potential addicting qualities.  We recommend that you take 1 tablet every 6 hours as needed for severe pain.  Do not drive or operate heavy machinery when taking this medicine as it can be sedating. Do not drink alcohol or take other sedating medications when taking this medicine for safety reasons.  Keep this out of reach of small children.  Please be aware this medicine has Tylenol in it (325 mg/tab) do not exceed the maximum dose of Tylenol in a day per over the counter recommendations should you decide to supplement with Tylenol over the counter.   We have prescribed you new medication(s) today. Discuss the medications prescribed today with your pharmacist as they can have adverse effects and interactions with your other medicines including over the counter and prescribed medications. Seek medical evaluation if you start to experience new or abnormal symptoms after taking one of these medicines, seek care immediately if you start to experience difficulty breathing, feeling of your throat closing, facial swelling, or rash as these could be indications of a more serious allergic reaction  We have sent your urine for culture, we will call you if this is abnormal and start antibiotics. Please follow-up with your primary care provider within 3 days for general reevaluation.  We have also provided orthopedics follow-up for recheck of the shoulder pain.  Please also follow-up with the bracing company.  Return to the  ER for new or worsening symptoms or any other concerns.

## 2019-05-07 ENCOUNTER — Telehealth: Payer: Self-pay | Admitting: Family Medicine

## 2019-05-07 DIAGNOSIS — M25519 Pain in unspecified shoulder: Secondary | ICD-10-CM | POA: Diagnosis not present

## 2019-05-07 DIAGNOSIS — Z743 Need for continuous supervision: Secondary | ICD-10-CM | POA: Diagnosis not present

## 2019-05-07 DIAGNOSIS — Z7401 Bed confinement status: Secondary | ICD-10-CM | POA: Diagnosis not present

## 2019-05-07 DIAGNOSIS — M2550 Pain in unspecified joint: Secondary | ICD-10-CM | POA: Diagnosis not present

## 2019-05-07 NOTE — Progress Notes (Signed)
°  Chronic Care Management   Outreach Note  05/07/2019 Name: ROMAN DUBUC MRN: [id number] DOB: 1960-02-01  Referred by: Excell Seltzer, MD Reason for referral : No chief complaint on file.   An unsuccessful telephone outreach was attempted today. The patient was referred to the pharmacist for assistance with care management and care coordination.   Follow Up Plan:   Raynicia Dukes [employer] Scheduler

## 2019-05-07 NOTE — ED Notes (Signed)
Patient departed by ptar services, alert and stable, E sign not available

## 2019-05-09 LAB — URINE CULTURE

## 2019-05-15 ENCOUNTER — Telehealth: Payer: Self-pay | Admitting: Family Medicine
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
## 2019-05-15 NOTE — Telephone Encounter (Signed)
FL-2 form printed and placed in Dr. Daphine Deutscher in box to complete on Friday when she is back in the office.

## 2019-05-15 NOTE — Telephone Encounter (Signed)
Noted  

## 2019-05-16 ENCOUNTER — Telehealth: Payer: Self-pay | Admitting: Family Medicine

## 2019-05-16 NOTE — Telephone Encounter (Signed)
Patient's daughter Selena Batten dropped of FMLA paperwork.  Paperwork placed on provider cart.

## 2019-05-17 ENCOUNTER — Ambulatory Visit (INDEPENDENT_AMBULATORY_CARE_PROVIDER_SITE_OTHER): Payer: Medicare Other | Admitting: Family Medicine

## 2019-05-17 ENCOUNTER — Encounter: Payer: Self-pay | Admitting: Family Medicine

## 2019-05-17 ENCOUNTER — Other Ambulatory Visit: Payer: Self-pay

## 2019-05-17 VITALS — BP 146/100 | HR 86 | Temp 98.2°F

## 2019-05-17 DIAGNOSIS — G8250 Quadriplegia, unspecified: Secondary | ICD-10-CM

## 2019-05-17 DIAGNOSIS — M2450 Contracture, unspecified joint: Secondary | ICD-10-CM

## 2019-05-17 DIAGNOSIS — M25622 Stiffness of left elbow, not elsewhere classified: Secondary | ICD-10-CM

## 2019-05-17 DIAGNOSIS — M25512 Pain in left shoulder: Secondary | ICD-10-CM | POA: Diagnosis not present
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
## 2019-05-20 NOTE — Telephone Encounter (Signed)
Patient's daughter Cala Bradford called today She stated she took the [facility] to the nursing home and they stated that it was for adult care. That they are needing it to be for long term care. Cala Bradford would like to know if this could be corrected for her to pick up.   They are trying to get the patient into the nursing home at the end of this week and would like to have this completed as soon as possible
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
## 2019-05-21 ENCOUNTER — Telehealth: Payer: Self-pay

## 2019-05-21 ENCOUNTER — Telehealth: Payer: Self-pay | Admitting: Family Medicine

## 2019-05-21 NOTE — Telephone Encounter (Signed)
I spoke with Travis Cantrell, pts daughter (no DPR signed) who pt is presently living with and pt and Travis Cantrell is on speaker phone and pt said OK for Travis Cantrell to get information about pt; pt will sign a new DPR. Pt said that the urinary catheter is having a lot of sediment and pts daughter thinks line is stopped up due to infection. The sediment started this morning. Pt has been drinking cranberry juice; pt does not have fever, abd pain or swelling. Pt said while at appt on 05/17/19 could not get a urine specimen. Travis Cantrell wants to know what to do to help pt with this.

## 2019-05-21 NOTE — Telephone Encounter (Signed)
I am assuming she is unable to change out catheter? That is what I would recommend. If no other symptoms.. doubt infection. He has urologist.. he can call them to have them change out the catheter.

## 2019-05-21 NOTE — Telephone Encounter (Signed)
See phone note from today 05/21/19.

## 2019-05-21 NOTE — Telephone Encounter (Signed)
Patients son in law Richard Ward came in office today with [facility] Form that was filled out that needed correcting. He stated on number 11 in needs to say "long term" instead of what it says now. He also needed his wife Harley [employer] form picked up as well. He stated that its urgent and needs both forms ready and to be picked before the end of this month or else they will have to wait another month in order to get Mr.Guerrero in.

## 2019-05-21 NOTE — Telephone Encounter (Signed)
pts daughter (did not leave a name) left v/m that pt's catheter is leaking and she is relatively new caregiver and requested cb. I left v/m requesting cb.

## 2019-05-21 NOTE — Telephone Encounter (Signed)
I am not sure what needs to be corrected.. I will make adjustments if I am directed to what was done incorrectly.

## 2019-05-21 NOTE — Telephone Encounter (Signed)
Form corrected and in outbox.

## 2019-05-21 NOTE — Telephone Encounter (Signed)
Completed and in outbox. 

## 2019-05-22 NOTE — Telephone Encounter (Signed)
Kimberly notified as instructed by telephone. 

## 2019-05-22 NOTE — Telephone Encounter (Signed)
Spoke with patient's daughter Selena Batten. Let her know paperwork up front for pick up.

## 2019-05-22 NOTE — Telephone Encounter (Signed)
Cala Bradford notified corrected FL-2 form is ready to be picked up at the front desk.

## 2019-05-22 NOTE — Telephone Encounter (Signed)
Will complete tommorow when I am in office.

## 2019-05-22 NOTE — Telephone Encounter (Signed)
Richard picked up fl2 this morning. He came back because they told him it was the incorrect form. They gave him the [facility] that they need filled out. He brought it by the office along with the original that was filled out. I placed both in the RX tower.

## 2019-05-22 NOTE — Telephone Encounter (Signed)
I have completed everything except checking the primary diagnosis and signature.

## 2019-05-23 NOTE — Telephone Encounter (Signed)
Family member advised. 

## 2019-05-23 NOTE — Telephone Encounter (Signed)
Form Completed.. please call pt family for pick up ASAP

## 2019-05-31 DIAGNOSIS — L89152 Pressure ulcer of sacral region, stage 2: Secondary | ICD-10-CM | POA: Diagnosis not present

## 2019-06-04 ENCOUNTER — Emergency Department
Admission: EM | Admit: 2019-06-04 | Discharge: 2019-06-04 | Disposition: A | Discharge status: Home / Self Care | Payer: Medicare Other | Attending: Emergency Medicine | Admitting: Emergency Medicine

## 2019-06-04 ENCOUNTER — Encounter: Payer: Self-pay | Admitting: Emergency Medicine

## 2019-06-04 DIAGNOSIS — Z79899 Other long term (current) drug therapy: Secondary | ICD-10-CM | POA: Insufficient documentation

## 2019-06-04 DIAGNOSIS — T83511A Infection and inflammatory reaction due to indwelling urethral catheter, initial encounter: Secondary | ICD-10-CM

## 2019-06-04 DIAGNOSIS — I10 Essential (primary) hypertension: Secondary | ICD-10-CM | POA: Diagnosis not present

## 2019-06-04 DIAGNOSIS — Z87891 Personal history of nicotine dependence: Secondary | ICD-10-CM | POA: Diagnosis not present

## 2019-06-04 DIAGNOSIS — G35 Multiple sclerosis: Secondary | ICD-10-CM | POA: Diagnosis not present

## 2019-06-04 DIAGNOSIS — R1030 Lower abdominal pain, unspecified: Secondary | ICD-10-CM | POA: Diagnosis present

## 2019-06-04 DIAGNOSIS — N390 Urinary tract infection, site not specified: Secondary | ICD-10-CM | POA: Insufficient documentation

## 2019-06-04 LAB — CBC WITH DIFFERENTIAL/PLATELET
Abs Immature Granulocytes: 0.05 10*3/uL (ref 0.00–0.07)
Basophils Absolute: 0.1 10*3/uL (ref 0.0–0.1)
Basophils Relative: 1 %
Eosinophils Absolute: 0.3 10*3/uL (ref 0.0–0.5)
Eosinophils Relative: 3 %
HCT: 44.3 % (ref 39.0–52.0)
Hemoglobin: 14.8 g/dL (ref 13.0–17.0)
Immature Granulocytes: 0 %
Lymphocytes Relative: 27 %
Lymphs Abs: 3.2 10*3/uL (ref 0.7–4.0)
MCH: 29.9 pg (ref 26.0–34.0)
MCHC: 33.4 g/dL (ref 30.0–36.0)
MCV: 89.5 fL (ref 80.0–100.0)
Monocytes Absolute: 1 10*3/uL (ref 0.1–1.0)
Monocytes Relative: 9 %
Neutro Abs: 7.3 10*3/uL (ref 1.7–7.7)
Neutrophils Relative %: 60 %
Platelets: 349 10*3/uL (ref 150–400)
RBC: 4.95 MIL/uL (ref 4.22–5.81)
RDW: 13.3 % (ref 11.5–15.5)
WBC: 12 10*3/uL — ABNORMAL HIGH (ref 4.0–10.5)
nRBC: 0 % (ref 0.0–0.2)

## 2019-06-04 LAB — COMPREHENSIVE METABOLIC PANEL
ALT: 13 U/L (ref 0–44)
AST: 13 U/L — ABNORMAL LOW (ref 15–41)
Albumin: 3.7 g/dL (ref 3.5–5.0)
Alkaline Phosphatase: 94 U/L (ref 38–126)
Anion gap: 9 (ref 5–15)
BUN: 35 mg/dL — ABNORMAL HIGH (ref 6–20)
CO2: 26 mmol/L (ref 22–32)
Calcium: 9 mg/dL (ref 8.9–10.3)
Chloride: 104 mmol/L (ref 98–111)
Creatinine, Ser: 0.92 mg/dL (ref 0.61–1.24)
GFR calc Af Amer: >60 mL/min (ref >60)
GFR calc non Af Amer: >60 mL/min (ref >60)
Glucose, Bld: 125 mg/dL — ABNORMAL HIGH (ref 70–99)
Potassium: 4.1 mmol/L (ref 3.5–5.1)
Sodium: 139 mmol/L (ref 135–145)
Total Bilirubin: 0.8 mg/dL (ref 0.3–1.2)
Total Protein: 7.4 g/dL (ref 6.5–8.1)

## 2019-06-04 LAB — URINALYSIS, COMPLETE (UACMP) WITH MICROSCOPIC
Bilirubin Urine: NEGATIVE
Glucose, UA: NEGATIVE mg/dL
Ketones, ur: NEGATIVE mg/dL
Nitrite: POSITIVE — AB
Protein, ur: 100 mg/dL — AB
RBC / HPF: >50 RBC/hpf — ABNORMAL HIGH (ref 0–5)
Specific Gravity, Urine: 1.018 (ref 1.005–1.030)
WBC, UA: >50 WBC/hpf — ABNORMAL HIGH (ref 0–5)
pH: 7 (ref 5.0–8.0)

## 2019-06-04 MED ORDER — CEPHALEXIN 500 MG PO CAPS: 500.0000 mg | ORAL_CAPSULE | Freq: Three times a day (TID) | ORAL | 0 refills | Status: AC

## 2019-06-04 MED ORDER — SODIUM CHLORIDE 0.9 % IV BOLUS
1000.0000 mL | Freq: Once | INTRAVENOUS | Status: AC
  Administered 2019-06-04: 09:00:00 1000 mL via INTRAVENOUS

## 2019-06-04 MED ORDER — SODIUM CHLORIDE 0.9 % IV SOLN
2.0000 g | Freq: Once | INTRAVENOUS | Status: AC
  Administered 2019-06-04: 2 g via INTRAVENOUS
  Filled 2019-06-04: qty 20

## 2019-06-04 MED ORDER — ONDANSETRON 4 MG PO TBDP
4.0000 mg | ORAL_TABLET | Freq: Three times a day (TID) | ORAL | 0 refills | Status: DC | PRN
Start: 2019-06-04 — End: 2020-03-27

## 2019-06-04 NOTE — ED Triage Notes (Signed)
Pt to ED with c/o of discharge and leaking around indwelling urinary catheter. Pt denies any pain or discomfort.

## 2019-06-04 NOTE — Discharge Instructions (Addendum)
I'd recommend following up with a Urologist in the near future to discuss your catheter options  Take the full course of antibiotic as prescribed  Make sure to drink at least 6-8 glasses of water daily, and to ideally keep your urine relatively clear/light yellow

## 2019-06-04 NOTE — ED Notes (Signed)
Pt verbalized understanding of discharge instructions. Pt discharged with [facility] for transport home. Pt unable to sign signature pad due to hx of MS. Pt in NAD at this time.
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
## 2019-06-05 LAB — URINE CULTURE
Culture: NO GROWTH
Special Requests: NORMAL

## 2019-06-06 NOTE — Telephone Encounter (Signed)
Patient's Daughter stated that she received a phone call from her work regarding her FMLA paperwork They stated that Dr Ermalene Searing filled out the dates that she should be out and it was several different dates. Her work is requesting a note from the provider with the dates that the daughter should be out to care for her date

## 2019-06-06 NOTE — Telephone Encounter (Signed)
Call patient's daughter.. find out what dates she needs it to say... make changes and I will initial.

## 2019-06-06 NOTE — Telephone Encounter (Signed)
Called pt's daughter to find out dates needed. Left a voicemail asking her to call office.

## 2019-06-06 NOTE — Telephone Encounter (Signed)
Patient's Daughter returned call She stated she is needing 4/20-6/20   And all she will need is a doctors note with those dates

## 2019-06-06 NOTE — Telephone Encounter (Signed)
Irving Burton,  Who is taking care of FMLA paperwork in Robin's absence?  Please forward to appropriate person.

## 2019-06-07 NOTE — Telephone Encounter (Signed)
FMLA papers updated and signed/in Donna's box. If she also needs note with those date please provide.

## 2019-06-07 NOTE — Telephone Encounter (Signed)
FMLA paperwork placed in your office inbox.

## 2019-06-07 NOTE — Telephone Encounter (Signed)
I can adjust paperwork and sign.. if someone will drop it off in my office.

## 2019-06-10 NOTE — Telephone Encounter (Signed)
Kim returned your call  She stated that she will come by to pick up the [facility] paperwork

## 2019-06-10 NOTE — Telephone Encounter (Signed)
Left message for Travis Cantrell to return my call.  Dr. Ermalene Searing has updated her FMLA paperwork with the dates requested.  I just need to know if Travis Cantrell wants to pick up the paperwork or if there is a fax number I can fax them in to for her.

## 2019-06-10 NOTE — Telephone Encounter (Signed)
Noted.  FMLA paperwork left up front for Kim to pick up.

## 2019-06-11 NOTE — Telephone Encounter (Signed)
FMLA note faxed to [phone] as requested.

## 2019-06-11 NOTE — Telephone Encounter (Signed)
Kim called back about FMLA  She stated that she tried to turn this into her work and they will not accept it because of the old dates being marked out and new ones written.   She stated that they asked for provider to write the new dates and reason she is needing those date on a script paper and fax it to them. Then they will take care of the [facility]- [phone]

## 2019-06-11 NOTE — Telephone Encounter (Signed)
I called and let Selena Batten know this has been faxed.

## 2019-06-11 NOTE — Telephone Encounter (Signed)
Rx in Donna's box in my office

## 2019-06-13 DIAGNOSIS — G8250 Quadriplegia, unspecified: Secondary | ICD-10-CM | POA: Diagnosis not present

## 2019-06-13 DIAGNOSIS — M25541 Pain in joints of right hand: Secondary | ICD-10-CM | POA: Diagnosis not present

## 2019-06-13 DIAGNOSIS — I10 Essential (primary) hypertension: Secondary | ICD-10-CM | POA: Diagnosis not present

## 2019-06-13 DIAGNOSIS — G35 Multiple sclerosis: Secondary | ICD-10-CM | POA: Diagnosis not present

## 2019-06-13 DIAGNOSIS — M24521 Contracture, right elbow: Secondary | ICD-10-CM | POA: Diagnosis not present

## 2019-06-13 DIAGNOSIS — D649 Anemia, unspecified: Secondary | ICD-10-CM | POA: Diagnosis not present

## 2019-06-13 DIAGNOSIS — M6259 Muscle wasting and atrophy, not elsewhere classified, multiple sites: Secondary | ICD-10-CM | POA: Diagnosis not present

## 2019-06-13 DIAGNOSIS — N319 Neuromuscular dysfunction of bladder, unspecified: Secondary | ICD-10-CM | POA: Diagnosis not present

## 2019-06-13 DIAGNOSIS — M25542 Pain in joints of left hand: Secondary | ICD-10-CM | POA: Diagnosis not present

## 2019-06-13 DIAGNOSIS — E785 Hyperlipidemia, unspecified: Secondary | ICD-10-CM | POA: Diagnosis not present

## 2019-06-13 DIAGNOSIS — G500 Trigeminal neuralgia: Secondary | ICD-10-CM | POA: Diagnosis not present

## 2019-06-13 DIAGNOSIS — M6281 Muscle weakness (generalized): Secondary | ICD-10-CM | POA: Diagnosis not present

## 2019-06-13 DIAGNOSIS — M24522 Contracture, left elbow: Secondary | ICD-10-CM | POA: Diagnosis not present

## 2019-06-18 DIAGNOSIS — I10 Essential (primary) hypertension: Secondary | ICD-10-CM | POA: Diagnosis not present

## 2019-06-18 DIAGNOSIS — E785 Hyperlipidemia, unspecified: Secondary | ICD-10-CM | POA: Diagnosis not present

## 2019-06-18 DIAGNOSIS — G8250 Quadriplegia, unspecified: Secondary | ICD-10-CM | POA: Diagnosis not present

## 2019-06-19 DIAGNOSIS — M25512 Pain in left shoulder: Secondary | ICD-10-CM | POA: Insufficient documentation

## 2019-06-19 DIAGNOSIS — M25622 Stiffness of left elbow, not elsewhere classified: Secondary | ICD-10-CM | POA: Insufficient documentation

## 2019-06-19 DIAGNOSIS — M2450 Contracture, unspecified joint: Secondary | ICD-10-CM | POA: Insufficient documentation

## 2019-06-19 NOTE — Assessment & Plan Note (Signed)
Considerable disability. 10-15 min was spent discussing completing of FL2 form for temporary / likely permanent admission to rehab. His daughter will need FMLA given her requires full time care now.

## 2019-06-19 NOTE — Assessment & Plan Note (Signed)
Minimal pain but decreased ROM of left elbow is new and significant. He cannot raise his hand to feed himself. He now requires full time care from his daughter.  Plan admission to rehab for PT.

## 2019-06-19 NOTE — Assessment & Plan Note (Signed)
Still with decreased ROM but pain much better with steroid injection.

## 2019-07-04 DIAGNOSIS — N390 Urinary tract infection, site not specified: Secondary | ICD-10-CM | POA: Diagnosis not present

## 2019-07-04 DIAGNOSIS — R319 Hematuria, unspecified: Secondary | ICD-10-CM | POA: Diagnosis not present

## 2019-07-05 DIAGNOSIS — Z960 Presence of urogenital implants: Secondary | ICD-10-CM | POA: Diagnosis not present

## 2019-07-05 DIAGNOSIS — N319 Neuromuscular dysfunction of bladder, unspecified: Secondary | ICD-10-CM | POA: Diagnosis not present

## 2019-07-05 DIAGNOSIS — G8250 Quadriplegia, unspecified: Secondary | ICD-10-CM | POA: Diagnosis not present

## 2019-07-05 DIAGNOSIS — G500 Trigeminal neuralgia: Secondary | ICD-10-CM | POA: Diagnosis not present

## 2019-07-17 DIAGNOSIS — M6281 Muscle weakness (generalized): Secondary | ICD-10-CM | POA: Diagnosis not present

## 2019-07-17 DIAGNOSIS — L89310 Pressure ulcer of right buttock, unstageable: Secondary | ICD-10-CM | POA: Diagnosis not present

## 2019-07-19 DIAGNOSIS — L98411 Non-pressure chronic ulcer of buttock limited to breakdown of skin: Secondary | ICD-10-CM | POA: Diagnosis not present

## 2019-07-22 DIAGNOSIS — R339 Retention of urine, unspecified: Secondary | ICD-10-CM | POA: Diagnosis not present

## 2019-07-23 DIAGNOSIS — G500 Trigeminal neuralgia: Secondary | ICD-10-CM | POA: Diagnosis not present

## 2019-07-23 DIAGNOSIS — K219 Gastro-esophageal reflux disease without esophagitis: Secondary | ICD-10-CM | POA: Diagnosis not present

## 2019-07-23 DIAGNOSIS — I10 Essential (primary) hypertension: Secondary | ICD-10-CM | POA: Diagnosis not present

## 2019-07-24 DIAGNOSIS — G35 Multiple sclerosis: Secondary | ICD-10-CM | POA: Diagnosis not present

## 2019-07-24 DIAGNOSIS — M6281 Muscle weakness (generalized): Secondary | ICD-10-CM | POA: Diagnosis not present

## 2019-07-24 DIAGNOSIS — L89310 Pressure ulcer of right buttock, unstageable: Secondary | ICD-10-CM | POA: Diagnosis not present

## 2019-07-31 DIAGNOSIS — M6281 Muscle weakness (generalized): Secondary | ICD-10-CM | POA: Diagnosis not present

## 2019-07-31 DIAGNOSIS — G35 Multiple sclerosis: Secondary | ICD-10-CM | POA: Diagnosis not present

## 2019-07-31 DIAGNOSIS — L89310 Pressure ulcer of right buttock, unstageable: Secondary | ICD-10-CM | POA: Diagnosis not present

## 2019-08-07 DIAGNOSIS — M24522 Contracture, left elbow: Secondary | ICD-10-CM | POA: Diagnosis not present

## 2019-08-07 DIAGNOSIS — G35 Multiple sclerosis: Secondary | ICD-10-CM | POA: Diagnosis not present

## 2019-08-07 DIAGNOSIS — L89310 Pressure ulcer of right buttock, unstageable: Secondary | ICD-10-CM | POA: Diagnosis not present

## 2019-08-07 DIAGNOSIS — M24521 Contracture, right elbow: Secondary | ICD-10-CM | POA: Diagnosis not present

## 2019-08-07 DIAGNOSIS — M6281 Muscle weakness (generalized): Secondary | ICD-10-CM | POA: Diagnosis not present

## 2019-08-08 DIAGNOSIS — M24521 Contracture, right elbow: Secondary | ICD-10-CM | POA: Diagnosis not present

## 2019-08-08 DIAGNOSIS — G35 Multiple sclerosis: Secondary | ICD-10-CM | POA: Diagnosis not present

## 2019-08-08 DIAGNOSIS — M24522 Contracture, left elbow: Secondary | ICD-10-CM | POA: Diagnosis not present

## 2019-08-08 DIAGNOSIS — M6281 Muscle weakness (generalized): Secondary | ICD-10-CM | POA: Diagnosis not present

## 2019-08-09 DIAGNOSIS — M6281 Muscle weakness (generalized): Secondary | ICD-10-CM | POA: Diagnosis not present

## 2019-08-09 DIAGNOSIS — M24522 Contracture, left elbow: Secondary | ICD-10-CM | POA: Diagnosis not present

## 2019-08-09 DIAGNOSIS — G35 Multiple sclerosis: Secondary | ICD-10-CM | POA: Diagnosis not present

## 2019-08-09 DIAGNOSIS — M24521 Contracture, right elbow: Secondary | ICD-10-CM | POA: Diagnosis not present

## 2019-08-12 DIAGNOSIS — M6281 Muscle weakness (generalized): Secondary | ICD-10-CM | POA: Diagnosis not present

## 2019-08-12 DIAGNOSIS — G35 Multiple sclerosis: Secondary | ICD-10-CM | POA: Diagnosis not present

## 2019-08-12 DIAGNOSIS — M24521 Contracture, right elbow: Secondary | ICD-10-CM | POA: Diagnosis not present

## 2019-08-12 DIAGNOSIS — M24522 Contracture, left elbow: Secondary | ICD-10-CM | POA: Diagnosis not present

## 2019-08-13 DIAGNOSIS — M24522 Contracture, left elbow: Secondary | ICD-10-CM | POA: Diagnosis not present

## 2019-08-13 DIAGNOSIS — M6281 Muscle weakness (generalized): Secondary | ICD-10-CM | POA: Diagnosis not present

## 2019-08-13 DIAGNOSIS — G35 Multiple sclerosis: Secondary | ICD-10-CM | POA: Diagnosis not present

## 2019-08-13 DIAGNOSIS — M24521 Contracture, right elbow: Secondary | ICD-10-CM | POA: Diagnosis not present

## 2019-08-14 DIAGNOSIS — L89310 Pressure ulcer of right buttock, unstageable: Secondary | ICD-10-CM | POA: Diagnosis not present

## 2019-08-14 DIAGNOSIS — M6281 Muscle weakness (generalized): Secondary | ICD-10-CM | POA: Diagnosis not present

## 2019-08-14 DIAGNOSIS — G35 Multiple sclerosis: Secondary | ICD-10-CM | POA: Diagnosis not present

## 2019-08-14 DIAGNOSIS — M24522 Contracture, left elbow: Secondary | ICD-10-CM | POA: Diagnosis not present

## 2019-08-14 DIAGNOSIS — M24521 Contracture, right elbow: Secondary | ICD-10-CM | POA: Diagnosis not present

## 2019-08-15 DIAGNOSIS — M24522 Contracture, left elbow: Secondary | ICD-10-CM | POA: Diagnosis not present

## 2019-08-15 DIAGNOSIS — M6281 Muscle weakness (generalized): Secondary | ICD-10-CM | POA: Diagnosis not present

## 2019-08-15 DIAGNOSIS — M24521 Contracture, right elbow: Secondary | ICD-10-CM | POA: Diagnosis not present

## 2019-08-15 DIAGNOSIS — G35 Multiple sclerosis: Secondary | ICD-10-CM | POA: Diagnosis not present

## 2019-08-16 DIAGNOSIS — M6281 Muscle weakness (generalized): Secondary | ICD-10-CM | POA: Diagnosis not present

## 2019-08-16 DIAGNOSIS — M24522 Contracture, left elbow: Secondary | ICD-10-CM | POA: Diagnosis not present

## 2019-08-16 DIAGNOSIS — G35 Multiple sclerosis: Secondary | ICD-10-CM | POA: Diagnosis not present

## 2019-08-16 DIAGNOSIS — M24521 Contracture, right elbow: Secondary | ICD-10-CM | POA: Diagnosis not present

## 2019-08-19 DIAGNOSIS — M6281 Muscle weakness (generalized): Secondary | ICD-10-CM | POA: Diagnosis not present

## 2019-08-19 DIAGNOSIS — L98411 Non-pressure chronic ulcer of buttock limited to breakdown of skin: Secondary | ICD-10-CM | POA: Diagnosis not present

## 2019-08-19 DIAGNOSIS — M24521 Contracture, right elbow: Secondary | ICD-10-CM | POA: Diagnosis not present

## 2019-08-19 DIAGNOSIS — G35 Multiple sclerosis: Secondary | ICD-10-CM | POA: Diagnosis not present

## 2019-08-19 DIAGNOSIS — R339 Retention of urine, unspecified: Secondary | ICD-10-CM | POA: Diagnosis not present

## 2019-08-19 DIAGNOSIS — M24522 Contracture, left elbow: Secondary | ICD-10-CM | POA: Diagnosis not present

## 2019-08-20 DIAGNOSIS — G35 Multiple sclerosis: Secondary | ICD-10-CM | POA: Diagnosis not present

## 2019-08-20 DIAGNOSIS — M6281 Muscle weakness (generalized): Secondary | ICD-10-CM | POA: Diagnosis not present

## 2019-08-20 DIAGNOSIS — M24521 Contracture, right elbow: Secondary | ICD-10-CM | POA: Diagnosis not present

## 2019-08-20 DIAGNOSIS — I10 Essential (primary) hypertension: Secondary | ICD-10-CM | POA: Diagnosis not present

## 2019-08-20 DIAGNOSIS — M24522 Contracture, left elbow: Secondary | ICD-10-CM | POA: Diagnosis not present

## 2019-08-21 DIAGNOSIS — G35 Multiple sclerosis: Secondary | ICD-10-CM | POA: Diagnosis not present

## 2019-08-21 DIAGNOSIS — M6281 Muscle weakness (generalized): Secondary | ICD-10-CM | POA: Diagnosis not present

## 2019-08-21 DIAGNOSIS — M24521 Contracture, right elbow: Secondary | ICD-10-CM | POA: Diagnosis not present

## 2019-08-21 DIAGNOSIS — L89310 Pressure ulcer of right buttock, unstageable: Secondary | ICD-10-CM | POA: Diagnosis not present

## 2019-08-21 DIAGNOSIS — M24522 Contracture, left elbow: Secondary | ICD-10-CM | POA: Diagnosis not present

## 2019-08-22 DIAGNOSIS — M6281 Muscle weakness (generalized): Secondary | ICD-10-CM | POA: Diagnosis not present

## 2019-08-22 DIAGNOSIS — M24521 Contracture, right elbow: Secondary | ICD-10-CM | POA: Diagnosis not present

## 2019-08-22 DIAGNOSIS — M24522 Contracture, left elbow: Secondary | ICD-10-CM | POA: Diagnosis not present

## 2019-08-22 DIAGNOSIS — G35 Multiple sclerosis: Secondary | ICD-10-CM | POA: Diagnosis not present

## 2019-08-26 DIAGNOSIS — I10 Essential (primary) hypertension: Secondary | ICD-10-CM | POA: Diagnosis not present

## 2019-08-26 DIAGNOSIS — M6281 Muscle weakness (generalized): Secondary | ICD-10-CM | POA: Diagnosis not present

## 2019-08-26 DIAGNOSIS — M24522 Contracture, left elbow: Secondary | ICD-10-CM | POA: Diagnosis not present

## 2019-08-26 DIAGNOSIS — M24521 Contracture, right elbow: Secondary | ICD-10-CM | POA: Diagnosis not present

## 2019-08-26 DIAGNOSIS — G35 Multiple sclerosis: Secondary | ICD-10-CM | POA: Diagnosis not present

## 2019-08-26 DIAGNOSIS — K5909 Other constipation: Secondary | ICD-10-CM | POA: Diagnosis not present

## 2019-08-26 DIAGNOSIS — K219 Gastro-esophageal reflux disease without esophagitis: Secondary | ICD-10-CM | POA: Diagnosis not present

## 2019-08-28 DIAGNOSIS — M24521 Contracture, right elbow: Secondary | ICD-10-CM | POA: Diagnosis not present

## 2019-08-28 DIAGNOSIS — L89310 Pressure ulcer of right buttock, unstageable: Secondary | ICD-10-CM | POA: Diagnosis not present

## 2019-08-28 DIAGNOSIS — G35 Multiple sclerosis: Secondary | ICD-10-CM | POA: Diagnosis not present

## 2019-08-28 DIAGNOSIS — M24522 Contracture, left elbow: Secondary | ICD-10-CM | POA: Diagnosis not present

## 2019-08-28 DIAGNOSIS — M6281 Muscle weakness (generalized): Secondary | ICD-10-CM | POA: Diagnosis not present

## 2019-09-02 DIAGNOSIS — M24522 Contracture, left elbow: Secondary | ICD-10-CM | POA: Diagnosis not present

## 2019-09-02 DIAGNOSIS — G35 Multiple sclerosis: Secondary | ICD-10-CM | POA: Diagnosis not present

## 2019-09-02 DIAGNOSIS — M6281 Muscle weakness (generalized): Secondary | ICD-10-CM | POA: Diagnosis not present

## 2019-09-02 DIAGNOSIS — M24521 Contracture, right elbow: Secondary | ICD-10-CM | POA: Diagnosis not present

## 2019-09-03 DIAGNOSIS — M24522 Contracture, left elbow: Secondary | ICD-10-CM | POA: Diagnosis not present

## 2019-09-03 DIAGNOSIS — M24521 Contracture, right elbow: Secondary | ICD-10-CM | POA: Diagnosis not present

## 2019-09-03 DIAGNOSIS — M6281 Muscle weakness (generalized): Secondary | ICD-10-CM | POA: Diagnosis not present

## 2019-09-03 DIAGNOSIS — G35 Multiple sclerosis: Secondary | ICD-10-CM | POA: Diagnosis not present

## 2019-09-04 DIAGNOSIS — G35 Multiple sclerosis: Secondary | ICD-10-CM | POA: Diagnosis not present

## 2019-09-04 DIAGNOSIS — L89310 Pressure ulcer of right buttock, unstageable: Secondary | ICD-10-CM | POA: Diagnosis not present

## 2019-09-04 DIAGNOSIS — M6281 Muscle weakness (generalized): Secondary | ICD-10-CM | POA: Diagnosis not present

## 2019-09-04 DIAGNOSIS — M24522 Contracture, left elbow: Secondary | ICD-10-CM | POA: Diagnosis not present

## 2019-09-04 DIAGNOSIS — M24521 Contracture, right elbow: Secondary | ICD-10-CM | POA: Diagnosis not present

## 2019-09-10 DIAGNOSIS — G35 Multiple sclerosis: Secondary | ICD-10-CM | POA: Diagnosis not present

## 2019-09-10 DIAGNOSIS — L89310 Pressure ulcer of right buttock, unstageable: Secondary | ICD-10-CM | POA: Diagnosis not present

## 2019-09-10 DIAGNOSIS — M6281 Muscle weakness (generalized): Secondary | ICD-10-CM | POA: Diagnosis not present

## 2019-09-12 DIAGNOSIS — L98411 Non-pressure chronic ulcer of buttock limited to breakdown of skin: Secondary | ICD-10-CM | POA: Diagnosis not present

## 2019-09-16 DIAGNOSIS — R339 Retention of urine, unspecified: Secondary | ICD-10-CM | POA: Diagnosis not present

## 2019-09-18 DIAGNOSIS — G35 Multiple sclerosis: Secondary | ICD-10-CM | POA: Diagnosis not present

## 2019-09-18 DIAGNOSIS — L89310 Pressure ulcer of right buttock, unstageable: Secondary | ICD-10-CM | POA: Diagnosis not present

## 2019-09-18 DIAGNOSIS — M6281 Muscle weakness (generalized): Secondary | ICD-10-CM | POA: Diagnosis not present

## 2019-09-26 DIAGNOSIS — M24575 Contracture, left foot: Secondary | ICD-10-CM | POA: Diagnosis not present

## 2019-09-26 DIAGNOSIS — M24574 Contracture, right foot: Secondary | ICD-10-CM | POA: Diagnosis not present

## 2019-09-26 DIAGNOSIS — G35 Multiple sclerosis: Secondary | ICD-10-CM | POA: Diagnosis not present

## 2019-09-27 DIAGNOSIS — G35 Multiple sclerosis: Secondary | ICD-10-CM | POA: Diagnosis not present

## 2019-09-27 DIAGNOSIS — M24575 Contracture, left foot: Secondary | ICD-10-CM | POA: Diagnosis not present

## 2019-09-27 DIAGNOSIS — M24574 Contracture, right foot: Secondary | ICD-10-CM | POA: Diagnosis not present

## 2019-09-30 DIAGNOSIS — G35 Multiple sclerosis: Secondary | ICD-10-CM | POA: Diagnosis not present

## 2019-09-30 DIAGNOSIS — M24574 Contracture, right foot: Secondary | ICD-10-CM | POA: Diagnosis not present

## 2019-09-30 DIAGNOSIS — M24575 Contracture, left foot: Secondary | ICD-10-CM | POA: Diagnosis not present

## 2019-10-01 DIAGNOSIS — M24575 Contracture, left foot: Secondary | ICD-10-CM | POA: Diagnosis not present

## 2019-10-01 DIAGNOSIS — M24574 Contracture, right foot: Secondary | ICD-10-CM | POA: Diagnosis not present

## 2019-10-01 DIAGNOSIS — G35 Multiple sclerosis: Secondary | ICD-10-CM | POA: Diagnosis not present

## 2019-10-07 DIAGNOSIS — M24575 Contracture, left foot: Secondary | ICD-10-CM | POA: Diagnosis not present

## 2019-10-07 DIAGNOSIS — G35 Multiple sclerosis: Secondary | ICD-10-CM | POA: Diagnosis not present

## 2019-10-07 DIAGNOSIS — M24574 Contracture, right foot: Secondary | ICD-10-CM | POA: Diagnosis not present

## 2019-10-14 DIAGNOSIS — R339 Retention of urine, unspecified: Secondary | ICD-10-CM | POA: Diagnosis not present

## 2019-10-15 DIAGNOSIS — G35 Multiple sclerosis: Secondary | ICD-10-CM | POA: Diagnosis not present

## 2019-10-15 DIAGNOSIS — M24574 Contracture, right foot: Secondary | ICD-10-CM | POA: Diagnosis not present

## 2019-10-15 DIAGNOSIS — M24575 Contracture, left foot: Secondary | ICD-10-CM | POA: Diagnosis not present

## 2019-10-16 DIAGNOSIS — M6281 Muscle weakness (generalized): Secondary | ICD-10-CM | POA: Diagnosis not present

## 2019-10-16 DIAGNOSIS — L8995 Pressure ulcer of unspecified site, unstageable: Secondary | ICD-10-CM | POA: Diagnosis not present

## 2019-10-16 DIAGNOSIS — G35 Multiple sclerosis: Secondary | ICD-10-CM | POA: Diagnosis not present

## 2019-10-17 DIAGNOSIS — I10 Essential (primary) hypertension: Secondary | ICD-10-CM | POA: Diagnosis not present

## 2019-10-17 DIAGNOSIS — G500 Trigeminal neuralgia: Secondary | ICD-10-CM | POA: Diagnosis not present

## 2019-10-17 DIAGNOSIS — G8250 Quadriplegia, unspecified: Secondary | ICD-10-CM | POA: Diagnosis not present

## 2019-10-17 DIAGNOSIS — E785 Hyperlipidemia, unspecified: Secondary | ICD-10-CM | POA: Diagnosis not present

## 2019-10-17 DIAGNOSIS — Z960 Presence of urogenital implants: Secondary | ICD-10-CM | POA: Diagnosis not present

## 2019-10-17 DIAGNOSIS — M24574 Contracture, right foot: Secondary | ICD-10-CM | POA: Diagnosis not present

## 2019-10-17 DIAGNOSIS — M24575 Contracture, left foot: Secondary | ICD-10-CM | POA: Diagnosis not present

## 2019-10-17 DIAGNOSIS — G35 Multiple sclerosis: Secondary | ICD-10-CM | POA: Diagnosis not present

## 2019-10-21 DIAGNOSIS — G35 Multiple sclerosis: Secondary | ICD-10-CM | POA: Diagnosis not present

## 2019-10-21 DIAGNOSIS — M24575 Contracture, left foot: Secondary | ICD-10-CM | POA: Diagnosis not present

## 2019-10-21 DIAGNOSIS — M24574 Contracture, right foot: Secondary | ICD-10-CM | POA: Diagnosis not present

## 2019-10-22 DIAGNOSIS — M24575 Contracture, left foot: Secondary | ICD-10-CM | POA: Diagnosis not present

## 2019-10-22 DIAGNOSIS — M24574 Contracture, right foot: Secondary | ICD-10-CM | POA: Diagnosis not present

## 2019-10-22 DIAGNOSIS — G35 Multiple sclerosis: Secondary | ICD-10-CM | POA: Diagnosis not present

## 2019-10-23 DIAGNOSIS — M6281 Muscle weakness (generalized): Secondary | ICD-10-CM | POA: Diagnosis not present

## 2019-10-23 DIAGNOSIS — G35 Multiple sclerosis: Secondary | ICD-10-CM | POA: Diagnosis not present

## 2019-10-23 DIAGNOSIS — L8995 Pressure ulcer of unspecified site, unstageable: Secondary | ICD-10-CM | POA: Diagnosis not present

## 2019-10-23 DIAGNOSIS — M24574 Contracture, right foot: Secondary | ICD-10-CM | POA: Diagnosis not present

## 2019-10-23 DIAGNOSIS — M24575 Contracture, left foot: Secondary | ICD-10-CM | POA: Diagnosis not present

## 2019-10-28 DIAGNOSIS — M62441 Contracture of muscle, right hand: Secondary | ICD-10-CM | POA: Diagnosis not present

## 2019-10-28 DIAGNOSIS — M24574 Contracture, right foot: Secondary | ICD-10-CM | POA: Diagnosis not present

## 2019-10-28 DIAGNOSIS — M24575 Contracture, left foot: Secondary | ICD-10-CM | POA: Diagnosis not present

## 2019-10-28 DIAGNOSIS — M24522 Contracture, left elbow: Secondary | ICD-10-CM | POA: Diagnosis not present

## 2019-10-28 DIAGNOSIS — M62442 Contracture of muscle, left hand: Secondary | ICD-10-CM | POA: Diagnosis not present

## 2019-10-28 DIAGNOSIS — M24521 Contracture, right elbow: Secondary | ICD-10-CM | POA: Diagnosis not present

## 2019-10-28 DIAGNOSIS — G35 Multiple sclerosis: Secondary | ICD-10-CM | POA: Diagnosis not present

## 2019-10-30 DIAGNOSIS — M24522 Contracture, left elbow: Secondary | ICD-10-CM | POA: Diagnosis not present

## 2019-10-30 DIAGNOSIS — G35 Multiple sclerosis: Secondary | ICD-10-CM | POA: Diagnosis not present

## 2019-10-30 DIAGNOSIS — M24521 Contracture, right elbow: Secondary | ICD-10-CM | POA: Diagnosis not present

## 2019-10-30 DIAGNOSIS — M62441 Contracture of muscle, right hand: Secondary | ICD-10-CM | POA: Diagnosis not present

## 2019-10-30 DIAGNOSIS — M24575 Contracture, left foot: Secondary | ICD-10-CM | POA: Diagnosis not present

## 2019-10-30 DIAGNOSIS — M24574 Contracture, right foot: Secondary | ICD-10-CM | POA: Diagnosis not present

## 2019-10-30 DIAGNOSIS — M6281 Muscle weakness (generalized): Secondary | ICD-10-CM | POA: Diagnosis not present

## 2019-10-30 DIAGNOSIS — L8995 Pressure ulcer of unspecified site, unstageable: Secondary | ICD-10-CM | POA: Diagnosis not present

## 2019-10-30 DIAGNOSIS — M62442 Contracture of muscle, left hand: Secondary | ICD-10-CM | POA: Diagnosis not present

## 2019-10-31 DIAGNOSIS — M24522 Contracture, left elbow: Secondary | ICD-10-CM | POA: Diagnosis not present

## 2019-10-31 DIAGNOSIS — M24574 Contracture, right foot: Secondary | ICD-10-CM | POA: Diagnosis not present

## 2019-10-31 DIAGNOSIS — G35 Multiple sclerosis: Secondary | ICD-10-CM | POA: Diagnosis not present

## 2019-10-31 DIAGNOSIS — M62442 Contracture of muscle, left hand: Secondary | ICD-10-CM | POA: Diagnosis not present

## 2019-10-31 DIAGNOSIS — M24575 Contracture, left foot: Secondary | ICD-10-CM | POA: Diagnosis not present

## 2019-10-31 DIAGNOSIS — M24521 Contracture, right elbow: Secondary | ICD-10-CM | POA: Diagnosis not present

## 2019-10-31 DIAGNOSIS — M62441 Contracture of muscle, right hand: Secondary | ICD-10-CM | POA: Diagnosis not present

## 2019-11-01 DIAGNOSIS — M24522 Contracture, left elbow: Secondary | ICD-10-CM | POA: Diagnosis not present

## 2019-11-01 DIAGNOSIS — M24521 Contracture, right elbow: Secondary | ICD-10-CM | POA: Diagnosis not present

## 2019-11-01 DIAGNOSIS — G35 Multiple sclerosis: Secondary | ICD-10-CM | POA: Diagnosis not present

## 2019-11-01 DIAGNOSIS — M62441 Contracture of muscle, right hand: Secondary | ICD-10-CM | POA: Diagnosis not present

## 2019-11-01 DIAGNOSIS — M24574 Contracture, right foot: Secondary | ICD-10-CM | POA: Diagnosis not present

## 2019-11-01 DIAGNOSIS — M62442 Contracture of muscle, left hand: Secondary | ICD-10-CM | POA: Diagnosis not present

## 2019-11-01 DIAGNOSIS — M24575 Contracture, left foot: Secondary | ICD-10-CM | POA: Diagnosis not present

## 2019-11-06 DIAGNOSIS — M6281 Muscle weakness (generalized): Secondary | ICD-10-CM | POA: Diagnosis not present

## 2019-11-06 DIAGNOSIS — G35 Multiple sclerosis: Secondary | ICD-10-CM | POA: Diagnosis not present

## 2019-11-06 DIAGNOSIS — L8995 Pressure ulcer of unspecified site, unstageable: Secondary | ICD-10-CM | POA: Diagnosis not present

## 2019-11-07 DIAGNOSIS — M24521 Contracture, right elbow: Secondary | ICD-10-CM | POA: Diagnosis not present

## 2019-11-07 DIAGNOSIS — G35 Multiple sclerosis: Secondary | ICD-10-CM | POA: Diagnosis not present

## 2019-11-07 DIAGNOSIS — M24574 Contracture, right foot: Secondary | ICD-10-CM | POA: Diagnosis not present

## 2019-11-07 DIAGNOSIS — M62441 Contracture of muscle, right hand: Secondary | ICD-10-CM | POA: Diagnosis not present

## 2019-11-07 DIAGNOSIS — M24522 Contracture, left elbow: Secondary | ICD-10-CM | POA: Diagnosis not present

## 2019-11-07 DIAGNOSIS — M62442 Contracture of muscle, left hand: Secondary | ICD-10-CM | POA: Diagnosis not present

## 2019-11-07 DIAGNOSIS — M24575 Contracture, left foot: Secondary | ICD-10-CM | POA: Diagnosis not present

## 2019-11-08 DIAGNOSIS — M24575 Contracture, left foot: Secondary | ICD-10-CM | POA: Diagnosis not present

## 2019-11-08 DIAGNOSIS — M24574 Contracture, right foot: Secondary | ICD-10-CM | POA: Diagnosis not present

## 2019-11-08 DIAGNOSIS — M62442 Contracture of muscle, left hand: Secondary | ICD-10-CM | POA: Diagnosis not present

## 2019-11-08 DIAGNOSIS — G35 Multiple sclerosis: Secondary | ICD-10-CM | POA: Diagnosis not present

## 2019-11-08 DIAGNOSIS — M24521 Contracture, right elbow: Secondary | ICD-10-CM | POA: Diagnosis not present

## 2019-11-08 DIAGNOSIS — M24522 Contracture, left elbow: Secondary | ICD-10-CM | POA: Diagnosis not present

## 2019-11-08 DIAGNOSIS — M62441 Contracture of muscle, right hand: Secondary | ICD-10-CM | POA: Diagnosis not present

## 2019-11-11 DIAGNOSIS — M24574 Contracture, right foot: Secondary | ICD-10-CM | POA: Diagnosis not present

## 2019-11-11 DIAGNOSIS — M24575 Contracture, left foot: Secondary | ICD-10-CM | POA: Diagnosis not present

## 2019-11-11 DIAGNOSIS — G35 Multiple sclerosis: Secondary | ICD-10-CM | POA: Diagnosis not present

## 2019-11-11 DIAGNOSIS — M62441 Contracture of muscle, right hand: Secondary | ICD-10-CM | POA: Diagnosis not present

## 2019-11-11 DIAGNOSIS — M62442 Contracture of muscle, left hand: Secondary | ICD-10-CM | POA: Diagnosis not present

## 2019-11-11 DIAGNOSIS — R339 Retention of urine, unspecified: Secondary | ICD-10-CM | POA: Diagnosis not present

## 2019-11-11 DIAGNOSIS — M24522 Contracture, left elbow: Secondary | ICD-10-CM | POA: Diagnosis not present

## 2019-11-11 DIAGNOSIS — M24521 Contracture, right elbow: Secondary | ICD-10-CM | POA: Diagnosis not present

## 2019-11-12 DIAGNOSIS — M24574 Contracture, right foot: Secondary | ICD-10-CM | POA: Diagnosis not present

## 2019-11-12 DIAGNOSIS — M62441 Contracture of muscle, right hand: Secondary | ICD-10-CM | POA: Diagnosis not present

## 2019-11-12 DIAGNOSIS — M24521 Contracture, right elbow: Secondary | ICD-10-CM | POA: Diagnosis not present

## 2019-11-12 DIAGNOSIS — G35 Multiple sclerosis: Secondary | ICD-10-CM | POA: Diagnosis not present

## 2019-11-12 DIAGNOSIS — M62442 Contracture of muscle, left hand: Secondary | ICD-10-CM | POA: Diagnosis not present

## 2019-11-12 DIAGNOSIS — M24522 Contracture, left elbow: Secondary | ICD-10-CM | POA: Diagnosis not present

## 2019-11-12 DIAGNOSIS — M24575 Contracture, left foot: Secondary | ICD-10-CM | POA: Diagnosis not present

## 2019-11-13 DIAGNOSIS — M24521 Contracture, right elbow: Secondary | ICD-10-CM | POA: Diagnosis not present

## 2019-11-13 DIAGNOSIS — G35 Multiple sclerosis: Secondary | ICD-10-CM | POA: Diagnosis not present

## 2019-11-13 DIAGNOSIS — M24575 Contracture, left foot: Secondary | ICD-10-CM | POA: Diagnosis not present

## 2019-11-13 DIAGNOSIS — M24574 Contracture, right foot: Secondary | ICD-10-CM | POA: Diagnosis not present

## 2019-11-13 DIAGNOSIS — M62441 Contracture of muscle, right hand: Secondary | ICD-10-CM | POA: Diagnosis not present

## 2019-11-13 DIAGNOSIS — M6281 Muscle weakness (generalized): Secondary | ICD-10-CM | POA: Diagnosis not present

## 2019-11-13 DIAGNOSIS — M24522 Contracture, left elbow: Secondary | ICD-10-CM | POA: Diagnosis not present

## 2019-11-13 DIAGNOSIS — M62442 Contracture of muscle, left hand: Secondary | ICD-10-CM | POA: Diagnosis not present

## 2019-11-13 DIAGNOSIS — L8995 Pressure ulcer of unspecified site, unstageable: Secondary | ICD-10-CM | POA: Diagnosis not present

## 2019-11-14 DIAGNOSIS — M24521 Contracture, right elbow: Secondary | ICD-10-CM | POA: Diagnosis not present

## 2019-11-14 DIAGNOSIS — M62441 Contracture of muscle, right hand: Secondary | ICD-10-CM | POA: Diagnosis not present

## 2019-11-14 DIAGNOSIS — M24522 Contracture, left elbow: Secondary | ICD-10-CM | POA: Diagnosis not present

## 2019-11-14 DIAGNOSIS — G35 Multiple sclerosis: Secondary | ICD-10-CM | POA: Diagnosis not present

## 2019-11-14 DIAGNOSIS — M24575 Contracture, left foot: Secondary | ICD-10-CM | POA: Diagnosis not present

## 2019-11-14 DIAGNOSIS — M62442 Contracture of muscle, left hand: Secondary | ICD-10-CM | POA: Diagnosis not present

## 2019-11-14 DIAGNOSIS — M24574 Contracture, right foot: Secondary | ICD-10-CM | POA: Diagnosis not present

## 2019-11-18 DIAGNOSIS — G35 Multiple sclerosis: Secondary | ICD-10-CM | POA: Diagnosis not present

## 2019-11-18 DIAGNOSIS — M24522 Contracture, left elbow: Secondary | ICD-10-CM | POA: Diagnosis not present

## 2019-11-18 DIAGNOSIS — M24574 Contracture, right foot: Secondary | ICD-10-CM | POA: Diagnosis not present

## 2019-11-18 DIAGNOSIS — M62442 Contracture of muscle, left hand: Secondary | ICD-10-CM | POA: Diagnosis not present

## 2019-11-18 DIAGNOSIS — M62441 Contracture of muscle, right hand: Secondary | ICD-10-CM | POA: Diagnosis not present

## 2019-11-18 DIAGNOSIS — M24521 Contracture, right elbow: Secondary | ICD-10-CM | POA: Diagnosis not present

## 2019-11-18 DIAGNOSIS — M24575 Contracture, left foot: Secondary | ICD-10-CM | POA: Diagnosis not present

## 2019-11-19 DIAGNOSIS — G35 Multiple sclerosis: Secondary | ICD-10-CM | POA: Diagnosis not present

## 2019-11-19 DIAGNOSIS — M24521 Contracture, right elbow: Secondary | ICD-10-CM | POA: Diagnosis not present

## 2019-11-19 DIAGNOSIS — M24574 Contracture, right foot: Secondary | ICD-10-CM | POA: Diagnosis not present

## 2019-11-19 DIAGNOSIS — M24575 Contracture, left foot: Secondary | ICD-10-CM | POA: Diagnosis not present

## 2019-11-19 DIAGNOSIS — M24522 Contracture, left elbow: Secondary | ICD-10-CM | POA: Diagnosis not present

## 2019-11-19 DIAGNOSIS — M62442 Contracture of muscle, left hand: Secondary | ICD-10-CM | POA: Diagnosis not present

## 2019-11-19 DIAGNOSIS — M62441 Contracture of muscle, right hand: Secondary | ICD-10-CM | POA: Diagnosis not present

## 2019-11-20 DIAGNOSIS — M24574 Contracture, right foot: Secondary | ICD-10-CM | POA: Diagnosis not present

## 2019-11-20 DIAGNOSIS — M24522 Contracture, left elbow: Secondary | ICD-10-CM | POA: Diagnosis not present

## 2019-11-20 DIAGNOSIS — M6281 Muscle weakness (generalized): Secondary | ICD-10-CM | POA: Diagnosis not present

## 2019-11-20 DIAGNOSIS — M62441 Contracture of muscle, right hand: Secondary | ICD-10-CM | POA: Diagnosis not present

## 2019-11-20 DIAGNOSIS — G35 Multiple sclerosis: Secondary | ICD-10-CM | POA: Diagnosis not present

## 2019-11-20 DIAGNOSIS — M24521 Contracture, right elbow: Secondary | ICD-10-CM | POA: Diagnosis not present

## 2019-11-20 DIAGNOSIS — M24575 Contracture, left foot: Secondary | ICD-10-CM | POA: Diagnosis not present

## 2019-11-20 DIAGNOSIS — L8995 Pressure ulcer of unspecified site, unstageable: Secondary | ICD-10-CM | POA: Diagnosis not present

## 2019-11-20 DIAGNOSIS — M62442 Contracture of muscle, left hand: Secondary | ICD-10-CM | POA: Diagnosis not present

## 2019-11-21 DIAGNOSIS — M24522 Contracture, left elbow: Secondary | ICD-10-CM | POA: Diagnosis not present

## 2019-11-21 DIAGNOSIS — G35 Multiple sclerosis: Secondary | ICD-10-CM | POA: Diagnosis not present

## 2019-11-21 DIAGNOSIS — M24575 Contracture, left foot: Secondary | ICD-10-CM | POA: Diagnosis not present

## 2019-11-21 DIAGNOSIS — M24574 Contracture, right foot: Secondary | ICD-10-CM | POA: Diagnosis not present

## 2019-11-21 DIAGNOSIS — M62442 Contracture of muscle, left hand: Secondary | ICD-10-CM | POA: Diagnosis not present

## 2019-11-21 DIAGNOSIS — M24521 Contracture, right elbow: Secondary | ICD-10-CM | POA: Diagnosis not present

## 2019-11-21 DIAGNOSIS — M62441 Contracture of muscle, right hand: Secondary | ICD-10-CM | POA: Diagnosis not present

## 2019-11-27 DIAGNOSIS — L8995 Pressure ulcer of unspecified site, unstageable: Secondary | ICD-10-CM | POA: Diagnosis not present

## 2019-11-27 DIAGNOSIS — G35 Multiple sclerosis: Secondary | ICD-10-CM | POA: Diagnosis not present

## 2019-11-27 DIAGNOSIS — M6281 Muscle weakness (generalized): Secondary | ICD-10-CM | POA: Diagnosis not present

## 2019-12-04 DIAGNOSIS — G35 Multiple sclerosis: Secondary | ICD-10-CM | POA: Diagnosis not present

## 2019-12-04 DIAGNOSIS — L8995 Pressure ulcer of unspecified site, unstageable: Secondary | ICD-10-CM | POA: Diagnosis not present

## 2019-12-04 DIAGNOSIS — M6281 Muscle weakness (generalized): Secondary | ICD-10-CM | POA: Diagnosis not present

## 2019-12-06 DIAGNOSIS — G500 Trigeminal neuralgia: Secondary | ICD-10-CM | POA: Diagnosis not present

## 2019-12-06 DIAGNOSIS — G35 Multiple sclerosis: Secondary | ICD-10-CM | POA: Diagnosis not present

## 2019-12-06 DIAGNOSIS — Z960 Presence of urogenital implants: Secondary | ICD-10-CM | POA: Diagnosis not present

## 2019-12-06 DIAGNOSIS — G8250 Quadriplegia, unspecified: Secondary | ICD-10-CM | POA: Diagnosis not present

## 2019-12-06 DIAGNOSIS — I10 Essential (primary) hypertension: Secondary | ICD-10-CM | POA: Diagnosis not present

## 2019-12-09 DIAGNOSIS — L89890 Pressure ulcer of other site, unstageable: Secondary | ICD-10-CM | POA: Diagnosis not present

## 2019-12-09 DIAGNOSIS — R339 Retention of urine, unspecified: Secondary | ICD-10-CM | POA: Diagnosis not present

## 2019-12-09 DIAGNOSIS — L98411 Non-pressure chronic ulcer of buttock limited to breakdown of skin: Secondary | ICD-10-CM | POA: Diagnosis not present

## 2019-12-10 DIAGNOSIS — R52 Pain, unspecified: Secondary | ICD-10-CM | POA: Diagnosis not present

## 2019-12-10 DIAGNOSIS — I10 Essential (primary) hypertension: Secondary | ICD-10-CM | POA: Diagnosis not present

## 2019-12-10 DIAGNOSIS — G8250 Quadriplegia, unspecified: Secondary | ICD-10-CM | POA: Diagnosis not present

## 2019-12-10 DIAGNOSIS — M62421 Contracture of muscle, right upper arm: Secondary | ICD-10-CM | POA: Diagnosis not present

## 2019-12-10 DIAGNOSIS — K219 Gastro-esophageal reflux disease without esophagitis: Secondary | ICD-10-CM | POA: Diagnosis not present

## 2019-12-11 DIAGNOSIS — G35 Multiple sclerosis: Secondary | ICD-10-CM | POA: Diagnosis not present

## 2019-12-11 DIAGNOSIS — M6281 Muscle weakness (generalized): Secondary | ICD-10-CM | POA: Diagnosis not present

## 2019-12-11 DIAGNOSIS — L8995 Pressure ulcer of unspecified site, unstageable: Secondary | ICD-10-CM | POA: Diagnosis not present

## 2019-12-15 DIAGNOSIS — N390 Urinary tract infection, site not specified: Secondary | ICD-10-CM | POA: Diagnosis not present

## 2019-12-16 DIAGNOSIS — N390 Urinary tract infection, site not specified: Secondary | ICD-10-CM | POA: Diagnosis not present

## 2019-12-18 DIAGNOSIS — L8995 Pressure ulcer of unspecified site, unstageable: Secondary | ICD-10-CM | POA: Diagnosis not present

## 2019-12-18 DIAGNOSIS — M6281 Muscle weakness (generalized): Secondary | ICD-10-CM | POA: Diagnosis not present

## 2019-12-18 DIAGNOSIS — G35 Multiple sclerosis: Secondary | ICD-10-CM | POA: Diagnosis not present

## 2019-12-25 DIAGNOSIS — L8995 Pressure ulcer of unspecified site, unstageable: Secondary | ICD-10-CM | POA: Diagnosis not present

## 2019-12-25 DIAGNOSIS — M6281 Muscle weakness (generalized): Secondary | ICD-10-CM | POA: Diagnosis not present

## 2019-12-25 DIAGNOSIS — G35 Multiple sclerosis: Secondary | ICD-10-CM | POA: Diagnosis not present

## 2019-12-27 DIAGNOSIS — H1032 Unspecified acute conjunctivitis, left eye: Secondary | ICD-10-CM | POA: Diagnosis not present

## 2019-12-27 DIAGNOSIS — G8250 Quadriplegia, unspecified: Secondary | ICD-10-CM | POA: Diagnosis not present

## 2019-12-27 DIAGNOSIS — G500 Trigeminal neuralgia: Secondary | ICD-10-CM | POA: Diagnosis not present

## 2019-12-27 DIAGNOSIS — E785 Hyperlipidemia, unspecified: Secondary | ICD-10-CM | POA: Diagnosis not present

## 2019-12-27 DIAGNOSIS — I10 Essential (primary) hypertension: Secondary | ICD-10-CM | POA: Diagnosis not present

## 2020-01-01 DIAGNOSIS — L8995 Pressure ulcer of unspecified site, unstageable: Secondary | ICD-10-CM | POA: Diagnosis not present

## 2020-01-01 DIAGNOSIS — M6281 Muscle weakness (generalized): Secondary | ICD-10-CM | POA: Diagnosis not present

## 2020-01-01 DIAGNOSIS — G35 Multiple sclerosis: Secondary | ICD-10-CM | POA: Diagnosis not present

## 2020-01-06 DIAGNOSIS — R339 Retention of urine, unspecified: Secondary | ICD-10-CM | POA: Diagnosis not present

## 2020-01-08 DIAGNOSIS — G35 Multiple sclerosis: Secondary | ICD-10-CM | POA: Diagnosis not present

## 2020-01-08 DIAGNOSIS — M6281 Muscle weakness (generalized): Secondary | ICD-10-CM | POA: Diagnosis not present

## 2020-01-08 DIAGNOSIS — L8995 Pressure ulcer of unspecified site, unstageable: Secondary | ICD-10-CM | POA: Diagnosis not present

## 2020-01-12 ENCOUNTER — Inpatient Hospital Stay (HOSPITAL_COMMUNITY): Payer: Medicare Other

## 2020-01-12 ENCOUNTER — Emergency Department (HOSPITAL_COMMUNITY): Payer: Medicare Other

## 2020-01-12 ENCOUNTER — Inpatient Hospital Stay (HOSPITAL_COMMUNITY)
Admission: EM | Admit: 2020-01-12 | Discharge: 2020-01-26 | DRG: 698 | Disposition: A | Discharge status: Skilled Nursing Facility | Payer: Medicare Other | Source: Skilled Nursing Facility | Attending: Internal Medicine | Admitting: Internal Medicine

## 2020-01-12 DIAGNOSIS — G35 Multiple sclerosis: Secondary | ICD-10-CM | POA: Diagnosis present

## 2020-01-12 DIAGNOSIS — R000 Tachycardia, unspecified: Secondary | ICD-10-CM | POA: Diagnosis present

## 2020-01-12 DIAGNOSIS — I10 Essential (primary) hypertension: Secondary | ICD-10-CM | POA: Diagnosis not present

## 2020-01-12 DIAGNOSIS — E872 Acidosis, unspecified: Secondary | ICD-10-CM

## 2020-01-12 DIAGNOSIS — B952 Enterococcus as the cause of diseases classified elsewhere: Secondary | ICD-10-CM | POA: Diagnosis present

## 2020-01-12 DIAGNOSIS — B9629 Other Escherichia coli [E. coli] as the cause of diseases classified elsewhere: Secondary | ICD-10-CM | POA: Diagnosis not present

## 2020-01-12 DIAGNOSIS — R059 Cough, unspecified: Secondary | ICD-10-CM | POA: Diagnosis not present

## 2020-01-12 DIAGNOSIS — Z888 Allergy status to other drugs, medicaments and biological substances status: Secondary | ICD-10-CM

## 2020-01-12 DIAGNOSIS — Z20822 Contact with and (suspected) exposure to covid-19: Secondary | ICD-10-CM | POA: Diagnosis present

## 2020-01-12 DIAGNOSIS — I499 Cardiac arrhythmia, unspecified: Secondary | ICD-10-CM | POA: Diagnosis not present

## 2020-01-12 DIAGNOSIS — N200 Calculus of kidney: Secondary | ICD-10-CM | POA: Diagnosis present

## 2020-01-12 DIAGNOSIS — Z87448 Personal history of other diseases of urinary system: Secondary | ICD-10-CM | POA: Diagnosis not present

## 2020-01-12 DIAGNOSIS — N10 Acute tubulo-interstitial nephritis: Secondary | ICD-10-CM | POA: Diagnosis present

## 2020-01-12 DIAGNOSIS — R6521 Severe sepsis with septic shock: Secondary | ICD-10-CM | POA: Diagnosis present

## 2020-01-12 DIAGNOSIS — N319 Neuromuscular dysfunction of bladder, unspecified: Secondary | ICD-10-CM | POA: Diagnosis present

## 2020-01-12 DIAGNOSIS — N390 Urinary tract infection, site not specified: Secondary | ICD-10-CM | POA: Diagnosis present

## 2020-01-12 DIAGNOSIS — L89322 Pressure ulcer of left buttock, stage 2: Secondary | ICD-10-CM | POA: Diagnosis not present

## 2020-01-12 DIAGNOSIS — L89151 Pressure ulcer of sacral region, stage 1: Secondary | ICD-10-CM | POA: Diagnosis present

## 2020-01-12 DIAGNOSIS — R404 Transient alteration of awareness: Secondary | ICD-10-CM | POA: Diagnosis not present

## 2020-01-12 DIAGNOSIS — Y738 Miscellaneous gastroenterology and urology devices associated with adverse incidents, not elsewhere classified: Secondary | ICD-10-CM | POA: Diagnosis present

## 2020-01-12 DIAGNOSIS — L89312 Pressure ulcer of right buttock, stage 2: Secondary | ICD-10-CM | POA: Diagnosis present

## 2020-01-12 DIAGNOSIS — A4152 Sepsis due to Pseudomonas: Secondary | ICD-10-CM | POA: Diagnosis not present

## 2020-01-12 DIAGNOSIS — B9689 Other specified bacterial agents as the cause of diseases classified elsewhere: Secondary | ICD-10-CM | POA: Diagnosis not present

## 2020-01-12 DIAGNOSIS — B965 Pseudomonas (aeruginosa) (mallei) (pseudomallei) as the cause of diseases classified elsewhere: Secondary | ICD-10-CM | POA: Diagnosis not present

## 2020-01-12 DIAGNOSIS — Z87891 Personal history of nicotine dependence: Secondary | ICD-10-CM

## 2020-01-12 DIAGNOSIS — M2550 Pain in unspecified joint: Secondary | ICD-10-CM | POA: Diagnosis not present

## 2020-01-12 DIAGNOSIS — E440 Moderate protein-calorie malnutrition: Secondary | ICD-10-CM | POA: Diagnosis not present

## 2020-01-12 DIAGNOSIS — R5381 Other malaise: Secondary | ICD-10-CM | POA: Diagnosis not present

## 2020-01-12 DIAGNOSIS — Q549 Hypospadias, unspecified: Secondary | ICD-10-CM

## 2020-01-12 DIAGNOSIS — Z7401 Bed confinement status: Secondary | ICD-10-CM

## 2020-01-12 DIAGNOSIS — Z79899 Other long term (current) drug therapy: Secondary | ICD-10-CM

## 2020-01-12 DIAGNOSIS — K5909 Other constipation: Secondary | ICD-10-CM | POA: Diagnosis not present

## 2020-01-12 DIAGNOSIS — F329 Major depressive disorder, single episode, unspecified: Secondary | ICD-10-CM | POA: Diagnosis present

## 2020-01-12 DIAGNOSIS — L89112 Pressure ulcer of right upper back, stage 2: Secondary | ICD-10-CM | POA: Diagnosis not present

## 2020-01-12 DIAGNOSIS — D72829 Elevated white blood cell count, unspecified: Secondary | ICD-10-CM | POA: Diagnosis not present

## 2020-01-12 DIAGNOSIS — Z743 Need for continuous supervision: Secondary | ICD-10-CM | POA: Diagnosis not present

## 2020-01-12 DIAGNOSIS — B957 Other staphylococcus as the cause of diseases classified elsewhere: Secondary | ICD-10-CM | POA: Diagnosis not present

## 2020-01-12 DIAGNOSIS — T83091A Other mechanical complication of indwelling urethral catheter, initial encounter: Secondary | ICD-10-CM | POA: Diagnosis present

## 2020-01-12 DIAGNOSIS — L89150 Pressure ulcer of sacral region, unstageable: Secondary | ICD-10-CM | POA: Diagnosis not present

## 2020-01-12 DIAGNOSIS — T83511A Infection and inflammatory reaction due to indwelling urethral catheter, initial encounter: Secondary | ICD-10-CM | POA: Diagnosis not present

## 2020-01-12 DIAGNOSIS — R918 Other nonspecific abnormal finding of lung field: Secondary | ICD-10-CM | POA: Diagnosis not present

## 2020-01-12 DIAGNOSIS — Z833 Family history of diabetes mellitus: Secondary | ICD-10-CM

## 2020-01-12 DIAGNOSIS — R140 Abdominal distension (gaseous): Secondary | ICD-10-CM | POA: Diagnosis present

## 2020-01-12 DIAGNOSIS — T83098A Other mechanical complication of other indwelling urethral catheter, initial encounter: Secondary | ICD-10-CM | POA: Diagnosis present

## 2020-01-12 DIAGNOSIS — Z960 Presence of urogenital implants: Secondary | ICD-10-CM | POA: Diagnosis present

## 2020-01-12 DIAGNOSIS — R739 Hyperglycemia, unspecified: Secondary | ICD-10-CM | POA: Diagnosis present

## 2020-01-12 DIAGNOSIS — R0689 Other abnormalities of breathing: Secondary | ICD-10-CM | POA: Diagnosis not present

## 2020-01-12 DIAGNOSIS — R0602 Shortness of breath: Secondary | ICD-10-CM | POA: Diagnosis not present

## 2020-01-12 DIAGNOSIS — B964 Proteus (mirabilis) (morganii) as the cause of diseases classified elsewhere: Secondary | ICD-10-CM | POA: Diagnosis not present

## 2020-01-12 DIAGNOSIS — L89003 Pressure ulcer of unspecified elbow, stage 3: Secondary | ICD-10-CM | POA: Diagnosis not present

## 2020-01-12 DIAGNOSIS — G8250 Quadriplegia, unspecified: Secondary | ICD-10-CM | POA: Diagnosis not present

## 2020-01-12 DIAGNOSIS — M160 Bilateral primary osteoarthritis of hip: Secondary | ICD-10-CM | POA: Diagnosis not present

## 2020-01-12 DIAGNOSIS — J9601 Acute respiratory failure with hypoxia: Secondary | ICD-10-CM | POA: Clinically undetermined

## 2020-01-12 DIAGNOSIS — K219 Gastro-esophageal reflux disease without esophagitis: Secondary | ICD-10-CM | POA: Diagnosis present

## 2020-01-12 DIAGNOSIS — R112 Nausea with vomiting, unspecified: Secondary | ICD-10-CM | POA: Diagnosis present

## 2020-01-12 DIAGNOSIS — K828 Other specified diseases of gallbladder: Secondary | ICD-10-CM | POA: Diagnosis not present

## 2020-01-12 DIAGNOSIS — L8990 Pressure ulcer of unspecified site, unspecified stage: Secondary | ICD-10-CM | POA: Diagnosis present

## 2020-01-12 DIAGNOSIS — N179 Acute kidney failure, unspecified: Secondary | ICD-10-CM | POA: Diagnosis not present

## 2020-01-12 DIAGNOSIS — A419 Sepsis, unspecified organism: Secondary | ICD-10-CM | POA: Diagnosis present

## 2020-01-12 DIAGNOSIS — E1165 Type 2 diabetes mellitus with hyperglycemia: Secondary | ICD-10-CM | POA: Diagnosis not present

## 2020-01-12 DIAGNOSIS — Z8249 Family history of ischemic heart disease and other diseases of the circulatory system: Secondary | ICD-10-CM

## 2020-01-12 DIAGNOSIS — Z807 Family history of other malignant neoplasms of lymphoid, hematopoietic and related tissues: Secondary | ICD-10-CM

## 2020-01-12 DIAGNOSIS — Z6824 Body mass index (BMI) 24.0-24.9, adult: Secondary | ICD-10-CM

## 2020-01-12 DIAGNOSIS — R7881 Bacteremia: Secondary | ICD-10-CM | POA: Diagnosis not present

## 2020-01-12 DIAGNOSIS — L89152 Pressure ulcer of sacral region, stage 2: Secondary | ICD-10-CM | POA: Diagnosis not present

## 2020-01-12 LAB — COMPREHENSIVE METABOLIC PANEL
ALT: 18 U/L (ref 0–44)
AST: 24 U/L (ref 15–41)
Albumin: 2.6 g/dL — ABNORMAL LOW (ref 3.5–5.0)
Alkaline Phosphatase: 152 U/L — ABNORMAL HIGH (ref 38–126)
Anion gap: 20 — ABNORMAL HIGH (ref 5–15)
BUN: 55 mg/dL — ABNORMAL HIGH (ref 6–20)
CO2: 14 mmol/L — ABNORMAL LOW (ref 22–32)
Calcium: 8.9 mg/dL (ref 8.9–10.3)
Chloride: 107 mmol/L (ref 98–111)
Creatinine, Ser: 4.71 mg/dL — ABNORMAL HIGH (ref 0.61–1.24)
GFR, Estimated: 14 mL/min — ABNORMAL LOW (ref >60)
Glucose, Bld: 246 mg/dL — ABNORMAL HIGH (ref 70–99)
Potassium: 4.2 mmol/L (ref 3.5–5.1)
Sodium: 141 mmol/L (ref 135–145)
Total Bilirubin: 0.8 mg/dL (ref 0.3–1.2)
Total Protein: 6.7 g/dL (ref 6.5–8.1)

## 2020-01-12 LAB — URINALYSIS, ROUTINE W REFLEX MICROSCOPIC
Bilirubin Urine: NEGATIVE
Glucose, UA: NEGATIVE mg/dL
Ketones, ur: NEGATIVE mg/dL
Nitrite: POSITIVE — AB
Protein, ur: 100 mg/dL — AB
Specific Gravity, Urine: 1.013 (ref 1.005–1.030)
WBC, UA: >50 WBC/hpf — ABNORMAL HIGH (ref 0–5)
pH: 7 (ref 5.0–8.0)

## 2020-01-12 LAB — CBG MONITORING, ED: Glucose-Capillary: 194 mg/dL — ABNORMAL HIGH (ref 70–99)

## 2020-01-12 LAB — CBC WITH DIFFERENTIAL/PLATELET
Abs Immature Granulocytes: 0.2 10*3/uL — ABNORMAL HIGH (ref 0.00–0.07)
Basophils Absolute: 0.1 10*3/uL (ref 0.0–0.1)
Basophils Relative: 0 %
Eosinophils Absolute: 0 10*3/uL (ref 0.0–0.5)
Eosinophils Relative: 0 %
HCT: 48.4 % (ref 39.0–52.0)
Hemoglobin: 15.1 g/dL (ref 13.0–17.0)
Immature Granulocytes: 1 %
Lymphocytes Relative: 2 %
Lymphs Abs: 0.4 10*3/uL — ABNORMAL LOW (ref 0.7–4.0)
MCH: 28 pg (ref 26.0–34.0)
MCHC: 31.2 g/dL (ref 30.0–36.0)
MCV: 89.6 fL (ref 80.0–100.0)
Monocytes Absolute: 1.1 10*3/uL — ABNORMAL HIGH (ref 0.1–1.0)
Monocytes Relative: 5 %
Neutro Abs: 19.3 10*3/uL — ABNORMAL HIGH (ref 1.7–7.7)
Neutrophils Relative %: 92 %
Platelets: 340 10*3/uL (ref 150–400)
RBC: 5.4 MIL/uL (ref 4.22–5.81)
RDW: 14.9 % (ref 11.5–15.5)
WBC: 21.1 10*3/uL — ABNORMAL HIGH (ref 4.0–10.5)
nRBC: 0 % (ref 0.0–0.2)

## 2020-01-12 LAB — HEMOGLOBIN A1C
Hgb A1c MFr Bld: 5.3 % (ref 4.8–5.6)
Mean Plasma Glucose: 105.41 mg/dL

## 2020-01-12 LAB — HIV ANTIBODY (ROUTINE TESTING W REFLEX): HIV Screen 4th Generation wRfx: NONREACTIVE

## 2020-01-12 LAB — CORTISOL: Cortisol, Plasma: 74.9 ug/dL

## 2020-01-12 LAB — CBC
HCT: 38.3 % — ABNORMAL LOW (ref 39.0–52.0)
Hemoglobin: 11.7 g/dL — ABNORMAL LOW (ref 13.0–17.0)
MCH: 28.4 pg (ref 26.0–34.0)
MCHC: 30.5 g/dL (ref 30.0–36.0)
MCV: 93 fL (ref 80.0–100.0)
Platelets: 256 10*3/uL (ref 150–400)
RBC: 4.12 MIL/uL — ABNORMAL LOW (ref 4.22–5.81)
RDW: 14.8 % (ref 11.5–15.5)
WBC: 27.4 10*3/uL — ABNORMAL HIGH (ref 4.0–10.5)
nRBC: 0 % (ref 0.0–0.2)

## 2020-01-12 LAB — GLUCOSE, CAPILLARY
Glucose-Capillary: 210 mg/dL — ABNORMAL HIGH (ref 70–99)
Glucose-Capillary: 235 mg/dL — ABNORMAL HIGH (ref 70–99)

## 2020-01-12 LAB — LACTIC ACID, PLASMA
Lactic Acid, Venous: 7.1 mmol/L (ref 0.5–1.9)
Lactic Acid, Venous: 5.8 mmol/L (ref 0.5–1.9)
Lactic Acid, Venous: 3.5 mmol/L (ref 0.5–1.9)

## 2020-01-12 LAB — MRSA PCR SCREENING: MRSA by PCR: NEGATIVE

## 2020-01-12 LAB — APTT: aPTT: 42 seconds — ABNORMAL HIGH (ref 24–36)

## 2020-01-12 LAB — RESP PANEL BY RT-PCR (FLU A&B, COVID) ARPGX2
Influenza A by PCR: NEGATIVE
Influenza B by PCR: NEGATIVE
SARS Coronavirus 2 by RT PCR: NEGATIVE

## 2020-01-12 LAB — PROTIME-INR
INR: 1.6 — ABNORMAL HIGH (ref 0.8–1.2)
Prothrombin Time: 18.1 seconds — ABNORMAL HIGH (ref 11.4–15.2)

## 2020-01-12 MED ORDER — LACTATED RINGERS IV SOLN: INTRAVENOUS | Status: DC

## 2020-01-12 MED ORDER — LACTATED RINGERS IV BOLUS (SEPSIS)
1000.0000 mL | Freq: Once | INTRAVENOUS | Status: AC
  Administered 2020-01-12: 1000 mL via INTRAVENOUS

## 2020-01-12 MED ORDER — DOCUSATE SODIUM 100 MG PO CAPS
100.0000 mg | ORAL_CAPSULE | Freq: Two times a day (BID) | ORAL | Status: DC | PRN
  Administered 2020-01-16: 13:00:00 100 mg via ORAL
  Filled 2020-01-12: qty 1

## 2020-01-12 MED ORDER — POLYETHYLENE GLYCOL 3350 17 G PO PACK: 17.0000 g | PACK | Freq: Every day | ORAL | Status: DC | PRN

## 2020-01-12 MED ORDER — VANCOMYCIN HCL 1750 MG/350ML IV SOLN
1750.0000 mg | Freq: Once | INTRAVENOUS | Status: AC
  Administered 2020-01-12: 14:00:00 1750 mg via INTRAVENOUS
  Filled 2020-01-12: qty 350

## 2020-01-12 MED ORDER — VANCOMYCIN VARIABLE DOSE PER UNSTABLE RENAL FUNCTION (PHARMACIST DOSING): Status: DC

## 2020-01-12 MED ORDER — VANCOMYCIN HCL IN DEXTROSE 1-5 GM/200ML-% IV SOLN: 1000.0000 mg | Freq: Once | INTRAVENOUS | Status: DC

## 2020-01-12 MED ORDER — NOREPINEPHRINE 4 MG/250ML-% IV SOLN
0.0000 ug/min | INTRAVENOUS | Status: DC
  Administered 2020-01-12: 16:00:00 2 ug/min via INTRAVENOUS
  Administered 2020-01-13: 07:00:00 3 ug/min via INTRAVENOUS
  Filled 2020-01-12 (×2): qty 250

## 2020-01-12 MED ORDER — ACETAMINOPHEN 650 MG RE SUPP
650.0000 mg | Freq: Once | RECTAL | Status: AC
  Administered 2020-01-12: 13:00:00 650 mg via RECTAL
  Filled 2020-01-12: qty 1

## 2020-01-12 MED ORDER — LACTATED RINGERS IV BOLUS
1000.0000 mL | Freq: Once | INTRAVENOUS | Status: AC
  Administered 2020-01-12: 14:00:00 1000 mL via INTRAVENOUS

## 2020-01-12 MED ORDER — INSULIN ASPART 100 UNIT/ML ~~LOC~~ SOLN
0.0000 [IU] | SUBCUTANEOUS | Status: DC
  Administered 2020-01-12 (×2): 3 [IU] via SUBCUTANEOUS
  Administered 2020-01-13 (×2): 1 [IU] via SUBCUTANEOUS
  Administered 2020-01-13: 21:00:00 2 [IU] via SUBCUTANEOUS
  Administered 2020-01-13 – 2020-01-14 (×2): 1 [IU] via SUBCUTANEOUS
  Administered 2020-01-16: 13:00:00 2 [IU] via SUBCUTANEOUS
  Administered 2020-01-16: 17:00:00 1 [IU] via SUBCUTANEOUS
  Administered 2020-01-16 – 2020-01-17 (×2): 2 [IU] via SUBCUTANEOUS
  Administered 2020-01-17: 21:00:00 3 [IU] via SUBCUTANEOUS
  Administered 2020-01-18: 17:00:00 2 [IU] via SUBCUTANEOUS
  Administered 2020-01-18 – 2020-01-19 (×2): 1 [IU] via SUBCUTANEOUS
  Administered 2020-01-19: 22:00:00 2 [IU] via SUBCUTANEOUS
  Administered 2020-01-19: 1 [IU] via SUBCUTANEOUS
  Administered 2020-01-20 (×2): 2 [IU] via SUBCUTANEOUS
  Administered 2020-01-21: 17:00:00 1 [IU] via SUBCUTANEOUS
  Administered 2020-01-21: 11:00:00 2 [IU] via SUBCUTANEOUS
  Administered 2020-01-21: 21:00:00 1 [IU] via SUBCUTANEOUS
  Administered 2020-01-22: 12:00:00 2 [IU] via SUBCUTANEOUS
  Administered 2020-01-22 (×2): 1 [IU] via SUBCUTANEOUS

## 2020-01-12 MED ORDER — HEPARIN SODIUM (PORCINE) 5000 UNIT/ML IJ SOLN
5000.0000 [IU] | Freq: Three times a day (TID) | INTRAMUSCULAR | Status: DC
  Administered 2020-01-12 – 2020-01-25 (×40): 5000 [IU] via SUBCUTANEOUS
  Filled 2020-01-12 (×40): qty 1

## 2020-01-12 MED ORDER — SODIUM CHLORIDE 0.9 % IV SOLN
2.0000 g | Freq: Once | INTRAVENOUS | Status: AC
  Administered 2020-01-12: 2 g via INTRAVENOUS
  Filled 2020-01-12: qty 2

## 2020-01-12 MED ORDER — SODIUM CHLORIDE 0.9 % IV SOLN
2.0000 g | INTRAVENOUS | Status: DC
  Administered 2020-01-13 – 2020-01-15 (×3): 2 g via INTRAVENOUS
  Filled 2020-01-12 (×4): qty 2

## 2020-01-12 NOTE — ED Notes (Signed)
Dr. Manus Gunning made aware of lactic acid, orders to be placed.

## 2020-01-12 NOTE — ED Notes (Signed)
MD placing a central line at this time
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
## 2020-01-12 NOTE — Progress Notes (Signed)
Pharmacy Antibiotic Note  Travis Cantrell is a 59 y.o. male admitted on 01/12/2020 with sepsis.  Pharmacy has been consulted for Cefepime and Vancomycin dosing.      Temp (24hrs), Avg:105 F (40.6 C), Min:105 F (40.6 C), Max:105 F (40.6 C)  Recent Labs  Lab 01/12/20 1256  WBC 21.1*  CREATININE 4.71*  LATICACIDVEN 7.1*    CrCl cannot be calculated (Unknown ideal weight.).    Allergies  Allergen Reactions  . Baclofen Diarrhea    Antimicrobials this admission: 12/19 Cefepime >>  12/19 Vancomycin >>   Dose adjustments this admission: N/a  Microbiology results: Pending   Plan:  - Cefepime 2g IV q24h - Vancomycin 1750mg  IV x 1 dose  - Will dose vancomycin based on random levels due to acute AKI (scr 4.71 baseline ~ 0.9) - Monitor patients renal function and urine output  - De-escalate ABX when appropriate   Thank you for allowing pharmacy to be a part of this patient's care.  PharmD. BCPS 01/12/2020 2:46 PM
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
## 2020-01-13 ENCOUNTER — Inpatient Hospital Stay (HOSPITAL_COMMUNITY): Payer: Medicare Other

## 2020-01-13 DIAGNOSIS — B964 Proteus (mirabilis) (morganii) as the cause of diseases classified elsewhere: Secondary | ICD-10-CM

## 2020-01-13 DIAGNOSIS — G8250 Quadriplegia, unspecified: Secondary | ICD-10-CM

## 2020-01-13 DIAGNOSIS — N390 Urinary tract infection, site not specified: Secondary | ICD-10-CM

## 2020-01-13 DIAGNOSIS — R6521 Severe sepsis with septic shock: Secondary | ICD-10-CM | POA: Diagnosis not present

## 2020-01-13 DIAGNOSIS — L8990 Pressure ulcer of unspecified site, unspecified stage: Secondary | ICD-10-CM | POA: Diagnosis present

## 2020-01-13 DIAGNOSIS — L89151 Pressure ulcer of sacral region, stage 1: Secondary | ICD-10-CM

## 2020-01-13 DIAGNOSIS — B965 Pseudomonas (aeruginosa) (mallei) (pseudomallei) as the cause of diseases classified elsewhere: Secondary | ICD-10-CM

## 2020-01-13 DIAGNOSIS — R7881 Bacteremia: Secondary | ICD-10-CM

## 2020-01-13 DIAGNOSIS — B957 Other staphylococcus as the cause of diseases classified elsewhere: Secondary | ICD-10-CM

## 2020-01-13 DIAGNOSIS — A419 Sepsis, unspecified organism: Secondary | ICD-10-CM | POA: Diagnosis not present

## 2020-01-13 DIAGNOSIS — I10 Essential (primary) hypertension: Secondary | ICD-10-CM

## 2020-01-13 DIAGNOSIS — K219 Gastro-esophageal reflux disease without esophagitis: Secondary | ICD-10-CM

## 2020-01-13 LAB — ECHOCARDIOGRAM COMPLETE
AR max vel: 3.21 cm2
AV Area VTI: 3.22 cm2
AV Area mean vel: 2.92 cm2
AV Mean grad: 4 mmHg
AV Peak grad: 6.2 mmHg
Ao pk vel: 1.24 m/s
Area-P 1/2: 2.42 cm2
Height: 70 in
S' Lateral: 3.1 cm
Weight: 2634.94 oz

## 2020-01-13 LAB — BLOOD CULTURE ID PANEL (REFLEXED) - BCID2
A.calcoaceticus-baumannii: NOT DETECTED
Bacteroides fragilis: NOT DETECTED
CTX-M ESBL: NOT DETECTED
Candida albicans: NOT DETECTED
Candida auris: NOT DETECTED
Candida glabrata: NOT DETECTED
Candida krusei: NOT DETECTED
Candida parapsilosis: NOT DETECTED
Candida tropicalis: NOT DETECTED
Carbapenem resist OXA 48 LIKE: NOT DETECTED
Carbapenem resistance IMP: NOT DETECTED
Carbapenem resistance KPC: NOT DETECTED
Carbapenem resistance NDM: NOT DETECTED
Carbapenem resistance VIM: NOT DETECTED
Cryptococcus neoformans/gattii: NOT DETECTED
Enterobacter cloacae complex: NOT DETECTED
Enterobacterales: DETECTED — AB
Enterococcus Faecium: NOT DETECTED
Enterococcus faecalis: DETECTED — AB
Escherichia coli: NOT DETECTED
Haemophilus influenzae: NOT DETECTED
Klebsiella aerogenes: NOT DETECTED
Klebsiella oxytoca: NOT DETECTED
Klebsiella pneumoniae: NOT DETECTED
Listeria monocytogenes: NOT DETECTED
Neisseria meningitidis: NOT DETECTED
Proteus species: DETECTED — AB
Pseudomonas aeruginosa: DETECTED — AB
Salmonella species: NOT DETECTED
Serratia marcescens: NOT DETECTED
Staphylococcus aureus (BCID): NOT DETECTED
Staphylococcus epidermidis: NOT DETECTED
Staphylococcus lugdunensis: NOT DETECTED
Staphylococcus species: DETECTED — AB
Stenotrophomonas maltophilia: NOT DETECTED
Streptococcus agalactiae: NOT DETECTED
Streptococcus pneumoniae: NOT DETECTED
Streptococcus pyogenes: NOT DETECTED
Streptococcus species: NOT DETECTED
Vancomycin resistance: NOT DETECTED

## 2020-01-13 LAB — CBC
HCT: 36.5 % — ABNORMAL LOW (ref 39.0–52.0)
Hemoglobin: 11.5 g/dL — ABNORMAL LOW (ref 13.0–17.0)
MCH: 28.7 pg (ref 26.0–34.0)
MCHC: 31.5 g/dL (ref 30.0–36.0)
MCV: 91 fL (ref 80.0–100.0)
Platelets: 287 10*3/uL (ref 150–400)
RBC: 4.01 MIL/uL — ABNORMAL LOW (ref 4.22–5.81)
RDW: 15 % (ref 11.5–15.5)
WBC: 33.2 10*3/uL — ABNORMAL HIGH (ref 4.0–10.5)
nRBC: 0 % (ref 0.0–0.2)

## 2020-01-13 LAB — BASIC METABOLIC PANEL
Anion gap: 12 (ref 5–15)
BUN: 44 mg/dL — ABNORMAL HIGH (ref 6–20)
CO2: 24 mmol/L (ref 22–32)
Calcium: 8.3 mg/dL — ABNORMAL LOW (ref 8.9–10.3)
Chloride: 108 mmol/L (ref 98–111)
Creatinine, Ser: 2.49 mg/dL — ABNORMAL HIGH (ref 0.61–1.24)
GFR, Estimated: 29 mL/min — ABNORMAL LOW (ref >60)
Glucose, Bld: 124 mg/dL — ABNORMAL HIGH (ref 70–99)
Potassium: 4 mmol/L (ref 3.5–5.1)
Sodium: 144 mmol/L (ref 135–145)

## 2020-01-13 LAB — GLUCOSE, CAPILLARY
Glucose-Capillary: 135 mg/dL — ABNORMAL HIGH (ref 70–99)
Glucose-Capillary: 71 mg/dL (ref 70–99)
Glucose-Capillary: 143 mg/dL — ABNORMAL HIGH (ref 70–99)
Glucose-Capillary: 139 mg/dL — ABNORMAL HIGH (ref 70–99)
Glucose-Capillary: 163 mg/dL — ABNORMAL HIGH (ref 70–99)
Glucose-Capillary: 93 mg/dL (ref 70–99)

## 2020-01-13 LAB — MAGNESIUM: Magnesium: 2.2 mg/dL (ref 1.7–2.4)

## 2020-01-13 LAB — LACTIC ACID, PLASMA: Lactic Acid, Venous: 1.9 mmol/L (ref 0.5–1.9)

## 2020-01-13 LAB — PHOSPHORUS: Phosphorus: 2.9 mg/dL (ref 2.5–4.6)

## 2020-01-13 MED ORDER — CHLORHEXIDINE GLUCONATE CLOTH 2 % EX PADS
6.0000 | MEDICATED_PAD | Freq: Every day | CUTANEOUS | Status: DC
  Administered 2020-01-13 – 2020-01-25 (×13): 6 via TOPICAL

## 2020-01-13 MED ORDER — POLYVINYL ALCOHOL 1.4 % OP SOLN
2.0000 [drp] | OPHTHALMIC | Status: DC
  Administered 2020-01-13 – 2020-01-25 (×61): 2 [drp] via OPHTHALMIC
  Filled 2020-01-13: qty 15

## 2020-01-13 MED ORDER — WHITE PETROLATUM EX OINT
TOPICAL_OINTMENT | CUTANEOUS | Status: AC
  Administered 2020-01-13: 1
  Filled 2020-01-13: qty 28.35
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
## 2020-01-13 NOTE — Progress Notes (Signed)
  Echocardiogram 2D Echocardiogram has been performed.  Gerda Diss 01/13/2020, 4:10 PM

## 2020-01-13 NOTE — Progress Notes (Signed)
PHARMACY - PHYSICIAN COMMUNICATION CRITICAL VALUE ALERT - BLOOD CULTURE IDENTIFICATION (BCID)  Travis Cantrell is an 59 y.o. male who presented to [facility] on 01/12/2020 with a chief complaint of septic shock  Assessment:  Polymicrobial bacteremia, WBC elevated  Name of physician (or Provider) Contacted:  Dr. Arsenio Loader  Current antibiotics: Vancomycin/Cefepime  Changes to prescribed antibiotics recommended:  None  Results for orders placed or performed during the hospital encounter of 01/12/20  Blood Culture ID Panel (Reflexed) (Collected: 01/12/2020 12:56 PM)  Result Value Ref Range   Enterococcus faecalis DETECTED (A) NOT DETECTED   Enterococcus Faecium NOT DETECTED NOT DETECTED   Listeria monocytogenes NOT DETECTED NOT DETECTED   Staphylococcus species DETECTED (A) NOT DETECTED   Staphylococcus aureus (BCID) NOT DETECTED NOT DETECTED   Staphylococcus epidermidis NOT DETECTED NOT DETECTED   Staphylococcus lugdunensis NOT DETECTED NOT DETECTED   Streptococcus species NOT DETECTED NOT DETECTED   Streptococcus agalactiae NOT DETECTED NOT DETECTED   Streptococcus pneumoniae NOT DETECTED NOT DETECTED   Streptococcus pyogenes NOT DETECTED NOT DETECTED   A.calcoaceticus-baumannii NOT DETECTED NOT DETECTED   Bacteroides fragilis NOT DETECTED NOT DETECTED   Enterobacterales DETECTED (A) NOT DETECTED   Enterobacter cloacae complex NOT DETECTED NOT DETECTED   Escherichia coli NOT DETECTED NOT DETECTED   Klebsiella aerogenes NOT DETECTED NOT DETECTED   Klebsiella oxytoca NOT DETECTED NOT DETECTED   Klebsiella pneumoniae NOT DETECTED NOT DETECTED   Proteus species DETECTED (A) NOT DETECTED   Salmonella species NOT DETECTED NOT DETECTED   Serratia marcescens NOT DETECTED NOT DETECTED   Haemophilus influenzae NOT DETECTED NOT DETECTED   Neisseria meningitidis NOT DETECTED NOT DETECTED   Pseudomonas aeruginosa DETECTED (A) NOT DETECTED   Stenotrophomonas maltophilia NOT DETECTED NOT  DETECTED   Candida albicans NOT DETECTED NOT DETECTED   Candida auris NOT DETECTED NOT DETECTED   Candida glabrata NOT DETECTED NOT DETECTED   Candida krusei NOT DETECTED NOT DETECTED   Candida parapsilosis NOT DETECTED NOT DETECTED   Candida tropicalis NOT DETECTED NOT DETECTED   Cryptococcus neoformans/gattii NOT DETECTED NOT DETECTED   CTX-M ESBL NOT DETECTED NOT DETECTED   Carbapenem resistance IMP NOT DETECTED NOT DETECTED   Carbapenem resistance KPC NOT DETECTED NOT DETECTED   Carbapenem resistance NDM NOT DETECTED NOT DETECTED   Carbapenem resist OXA 48 LIKE NOT DETECTED NOT DETECTED   Vancomycin resistance NOT DETECTED NOT DETECTED   Carbapenem resistance VIM NOT DETECTED NOT DETECTED    [name] 01/13/2020  6:58 AM
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
## 2020-01-14 DIAGNOSIS — R059 Cough, unspecified: Secondary | ICD-10-CM

## 2020-01-14 DIAGNOSIS — B952 Enterococcus as the cause of diseases classified elsewhere: Secondary | ICD-10-CM

## 2020-01-14 DIAGNOSIS — N179 Acute kidney failure, unspecified: Secondary | ICD-10-CM

## 2020-01-14 DIAGNOSIS — D72829 Elevated white blood cell count, unspecified: Secondary | ICD-10-CM

## 2020-01-14 DIAGNOSIS — R0602 Shortness of breath: Secondary | ICD-10-CM

## 2020-01-14 DIAGNOSIS — A419 Sepsis, unspecified organism: Secondary | ICD-10-CM | POA: Diagnosis not present

## 2020-01-14 DIAGNOSIS — R6521 Severe sepsis with septic shock: Secondary | ICD-10-CM | POA: Diagnosis not present

## 2020-01-14 LAB — CBC
HCT: 36.1 % — ABNORMAL LOW (ref 39.0–52.0)
Hemoglobin: 11 g/dL — ABNORMAL LOW (ref 13.0–17.0)
MCH: 27.6 pg (ref 26.0–34.0)
MCHC: 30.5 g/dL (ref 30.0–36.0)
MCV: 90.5 fL (ref 80.0–100.0)
Platelets: 204 10*3/uL (ref 150–400)
RBC: 3.99 MIL/uL — ABNORMAL LOW (ref 4.22–5.81)
RDW: 14.9 % (ref 11.5–15.5)
WBC: 10.7 10*3/uL — ABNORMAL HIGH (ref 4.0–10.5)
nRBC: 0 % (ref 0.0–0.2)

## 2020-01-14 LAB — GLUCOSE, CAPILLARY
Glucose-Capillary: 82 mg/dL (ref 70–99)
Glucose-Capillary: 83 mg/dL (ref 70–99)
Glucose-Capillary: 84 mg/dL (ref 70–99)
Glucose-Capillary: 128 mg/dL — ABNORMAL HIGH (ref 70–99)
Glucose-Capillary: 106 mg/dL — ABNORMAL HIGH (ref 70–99)
Glucose-Capillary: 84 mg/dL (ref 70–99)

## 2020-01-14 LAB — BASIC METABOLIC PANEL
Anion gap: 9 (ref 5–15)
BUN: 30 mg/dL — ABNORMAL HIGH (ref 6–20)
CO2: 23 mmol/L (ref 22–32)
Calcium: 8 mg/dL — ABNORMAL LOW (ref 8.9–10.3)
Chloride: 110 mmol/L (ref 98–111)
Creatinine, Ser: 1.5 mg/dL — ABNORMAL HIGH (ref 0.61–1.24)
GFR, Estimated: 53 mL/min — ABNORMAL LOW (ref >60)
Glucose, Bld: 88 mg/dL (ref 70–99)
Potassium: 3.7 mmol/L (ref 3.5–5.1)
Sodium: 142 mmol/L (ref 135–145)

## 2020-01-14 LAB — VANCOMYCIN, RANDOM: Vancomycin Rm: 9

## 2020-01-14 MED ORDER — VANCOMYCIN HCL IN DEXTROSE 1-5 GM/200ML-% IV SOLN
1000.0000 mg | INTRAVENOUS | Status: DC
  Filled 2020-01-14: qty 200

## 2020-01-14 MED ORDER — SODIUM CHLORIDE 0.9 % IV SOLN
620.0000 mg | Freq: Every day | INTRAVENOUS | Status: DC
  Administered 2020-01-14 – 2020-01-15 (×2): 620 mg via INTRAVENOUS
  Filled 2020-01-14 (×4): qty 12.4

## 2020-01-14 NOTE — NC FL2 (Signed)
[place] MEDICAID FL2 LEVEL OF CARE SCREENING TOOL     IDENTIFICATION  Patient Name: Travis Cantrell Birthdate: 07/26/1960 Sex: male Admission Date (Current Location): 01/12/2020  [facility] and [place] Number:  Producer, television/film/video and Address:  [facility], [address], [place], [place] [address]      Provider Number: [id number]  Attending Physician Name and Address:  Joseph Art, DO  Relative Name and Phone Number:       Current Level of Care: Hospital Recommended Level of Care: Skilled Nursing Facility Prior Approval Number:    Date Approved/Denied:   PASRR Number: [id number]  Discharge Plan: SNF    Current Diagnoses: Patient Active Problem List   Diagnosis Date Noted  . Pressure injury of skin 01/13/2020  . Septic shock (HCC) 01/12/2020  . Decreased ROM of left elbow 06/19/2019  . Contracture of joint of multiple sites 06/19/2019  . Acute pain of left shoulder 06/19/2019  . 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 Trigeminal neuralgia of left side of face 03/20/2018  . MDD (major depressive disorder), recurrent episode, moderate (HCC) 12/01/2017  . Contracture of muscle, right upper arm 12/23/2016  . Neurogenic bladder 10/28/2016  . History of decubitus ulcer 03/31/2016  . Left-sided face pain 02/05/2016  . Recurrent UTI 04/16/2015  . Essential hypertension, benign 11/19/2013  . Seborrheic dermatitis 12/06/2010  . Presence of indwelling urinary catheter 12/06/2010  . High cholesterol 05/25/2010  . Prediabetes 12/25/2009  . ONYCHOMYCOSIS, BILATERAL 09/25/2009  . CONSTIPATION 04/29/2008  . INSOMNIA, CHRONIC 08/29/2007  . Multiple sclerosis, primary progressive (HCC) 08/29/2007  . ALLERGIC RHINITIS 08/29/2007  . GERD 08/29/2007  . 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    Orientation RESPIRATION BLADDER Height & Weight     Self,Time,Situation,Place  Normal,O2 (4L Chronic) Incontinent,Indwelling catheter (Chronic foley) Weight: 170  lb 13.7 oz (77.5 kg) Height:  5\' 10"  (177.8 cm)  BEHAVIORAL SYMPTOMS/MOOD NEUROLOGICAL BOWEL NUTRITION STATUS      Incontinent Diet (Normal)  AMBULATORY STATUS COMMUNICATION OF NEEDS Skin   Total Care Verbally Other (Comment),PU Stage and Appropriate Care   PU Stage 2 Dressing: BID                   Personal Care Assistance Level of Assistance  Bathing,Dressing,Feeding Bathing Assistance: Maximum assistance Feeding assistance: Maximum assistance Dressing Assistance: Maximum assistance     Functional Limitations Info  Sight,Hearing,Speech Sight Info: Adequate Hearing Info: Adequate Speech Info: Adequate    SPECIAL CARE FACTORS FREQUENCY  PT (By licensed PT),OT (By licensed OT)     PT Frequency: 5x weekly OT Frequency: 5x weekly            Contractures Contractures Info: Present    Additional Factors Info  Code Status,Allergies Code Status Info: Full Code Allergies Info: Baclofen           Current Medications (01/14/2020):  This is the current hospital active medication list Current Facility-Administered Medications  Medication Dose Route Frequency Provider Last Rate Last Admin  . ceFEPIme (MAXIPIME) 2 g in sodium chloride 0.9 % 100 mL IVPB  2 g Intravenous Q24H 01/16/2020, MD   Stopped at 01/13/20 1505  . Chlorhexidine Gluconate Cloth 2 % PADS 6 each  6 each Topical Daily 01/15/20, MD   6 each at 01/13/20 1745  . DAPTOmycin (CUBICIN) 620 mg in sodium chloride 0.9 % IVPB  620 mg Intravenous Q2000 Manandhar, 01/15/20, MD      . docusate sodium (COLACE) capsule 100  mg  100 mg Oral BID PRN Mannam, Praveen, MD      . heparin injection 5,000 Units  5,000 Units Subcutaneous Q8H Mannam, Praveen, MD   5,000 Units at 01/14/20 0535  . insulin aspart (novoLOG) injection 0-9 Units  0-9 Units Subcutaneous Q4H Mannam, Praveen, MD   2 Units at 01/13/20 2059  . norepinephrine (LEVOPHED) 4mg  in premix infusion  0-40 mcg/min Intravenous Continuous , MD   Stopped at 01/13/20 [phone]  . polyethylene glycol (MIRALAX / GLYCOLAX) packet 17 g  17 g Oral Daily PRN Mannam, Praveen, MD      . polyvinyl alcohol (LIQUIFILM TEARS) 1.4 % ophthalmic solution 2 drop  2 drop Both Eyes Q4H 7846, MD   2 drop at 01/14/20 0535     Discharge Medications: Please see discharge summary for a list of discharge medications.  Relevant Imaging Results:  Relevant Lab Results:   Additional Information SSN: [id number]  01/16/20, LCSW

## 2020-01-14 NOTE — Progress Notes (Signed)
I have reviewed patient's transthoracic echocardiogram from January 13, 2020 and while there is thickening of the tricuspid valve there is no obvious echodensity or mass, and there is only trivial tricuspid regurgitation, if the patient has persistent bacteremia or fevers after completing his antibiotics for UTI, TEE would be reasonable at that time.  Please call us with any questions.  Tobias Alexander, MD 01/14/2020

## 2020-01-14 NOTE — Progress Notes (Addendum)
Progress Note    Travis Cantrell  WIO:[id number] DOB: 08-Mar-1960  DOA: 01/12/2020 PCP: Excell Seltzer, MD    Brief Narrative:    Medical records reviewed and are as summarized below:  Travis Cantrell is an 59 y.o. male who is a nursing home resident, quadriplegic with multiple sclerosis, decub ulcer and indwelling chronic Foley causing recurrent UTI admitted with 2 days of nausea, vomiting septic shock, presumed UTI.  Lactate elevated to 7 with AKI, anion gap acidosis and leukocytosis.  Foley appears clogged and was replaced in the ED.  Patient briefly required vasopressor support.  Tx to [facility] on 12/21.  Assessment/Plan:   Active Problems:   Septic shock (HCC)   Pressure injury of skin    Septic shock due bacteremia and UTI: Polymicrobial Bacteremia ( Staph species, E faecalis, Pseudomonas aeruginosa, Proteus) Continue IV fluid resuscitation IV abx ID consult: will need TEE -cards consult placed Indwelling Foley catheter was changed in the ED  AKI secondary to sepsis, blocked Foley -foley replaced Renal ultrasound ruled out obstruction -CR trending down  Debilitating multiple sclerosis Patient is quadriparetic with minimal movement -from SNF Has decubitus ulcer, unstageable  Acute respiratory failure -wean to off as tolerated  Pressure Injury 01/12/20 Buttocks Mid Stage 2 -  Partial thickness loss of dermis presenting as a shallow open injury with a red, pink wound bed without slough. (Active)  01/12/20 1700  Location: Buttocks  Location Orientation: Mid  Staging: Stage 2 -  Partial thickness loss of dermis presenting as a shallow open injury with a red, pink wound bed without slough.  Wound Description (Comments):   Present on Admission: Yes     Pressure Injury 01/12/20 Back Right;Upper;Bilateral Stage 2 -  Partial thickness loss of dermis presenting as a shallow open injury with a red, pink wound bed without slough. covered with yeast (Active)  01/12/20 1700   Location: Back  Location Orientation: Right;Upper;Bilateral  Staging: Stage 2 -  Partial thickness loss of dermis presenting as a shallow open injury with a red, pink wound bed without slough.  Wound Description (Comments): covered with yeast  Present on Admission: Yes       Family Communication/Anticipated D/C date and plan/Code Status   DVT prophylaxis: heoarub Code Status: Full Code.   Disposition Plan: Status is: Inpatient  Remains inpatient appropriate because:Inpatient level of care appropriate due to severity of illness   Dispo: The patient is from: SNF              Anticipated d/c is to: SNF              Anticipated d/c date is: > 3 days              Patient currently is not medically stable to d/c.         Medical Consultants:    PCCM  ID     Subjective:   No complaints -- feeling better than prior to admission  Objective:    Vitals:   01/14/20 0726 01/14/20 0800 01/14/20 0900 01/14/20 1139  BP:  114/79    Pulse:  89 88   Resp:  (!) 26 (!) 24   Temp: 97.9 F (36.6 C)   98.1 F (36.7 C)  TempSrc: Oral   Oral  SpO2:  92% 92%   Weight:      Height:        Intake/Output Summary (Last 24 hours) at 01/14/2020 1319 Last data filed at 01/14/2020  0800 Gross per 24 hour  Intake 641.49 ml  Output 1428 ml  Net -786.51 ml   Filed Weights   01/12/20 1700 01/13/20 0400 01/14/20 0500  Weight: 71.5 kg 74.7 kg 77.5 kg    Exam: In bed, laying flat rrr On [place] but in increased work of breathing +BS, distended Alert, pleasant   Data Reviewed:   I have personally reviewed following labs and imaging studies:  Labs: Labs show the following:   Basic Metabolic Panel: Recent Labs  Lab 01/12/20 1256 01/13/20 0518 01/14/20 0529  NA 141 144 142  K 4.2 4.0 3.7  CL 107 108 110  CO2 14* 24 23  GLUCOSE 246* 124* 88  BUN 55* 44* 30*  CREATININE 4.71* 2.49* 1.50*  CALCIUM 8.9 8.3* 8.0*  MG  --  2.2  --   PHOS  --  2.9  --     GFR Estimated Creatinine Clearance: 54.8 mL/min (A) (by C-G formula based on SCr of 1.5 mg/dL (H)). Liver Function Tests: Recent Labs  Lab 01/12/20 1256  AST 24  ALT 18  ALKPHOS 152*  BILITOT 0.8  PROT 6.7  ALBUMIN 2.6*   No results for input(s): LIPASE, AMYLASE in the last 168 hours. No results for input(s): AMMONIA in the last 168 hours. Coagulation profile Recent Labs  Lab 01/12/20 1256  INR 1.6*    CBC: Recent Labs  Lab 01/12/20 1256 01/12/20 1521 01/13/20 0518 01/14/20 0529  WBC 21.1* 27.4* 33.2* 10.7*  NEUTROABS 19.3*  --   --   --   HGB 15.1 11.7* 11.5* 11.0*  HCT 48.4 38.3* 36.5* 36.1*  MCV 89.6 93.0 91.0 90.5  PLT 340 256 287 204   Cardiac Enzymes: No results for input(s): CKTOTAL, CKMB, CKMBINDEX, TROPONINI in the last 168 hours. BNP (last 3 results) No results for input(s): PROBNP in the last 8760 hours. CBG: Recent Labs  Lab 01/13/20 1919 01/13/20 2310 01/14/20 0310 01/14/20 0721 01/14/20 1137  GLUCAP 163* 93 82 83 84   D-Dimer: No results for input(s): DDIMER in the last 72 hours. Hgb A1c: Recent Labs    01/12/20 1521  HGBA1C 5.3   Lipid Profile: No results for input(s): CHOL, HDL, LDLCALC, TRIG, CHOLHDL, LDLDIRECT in the last 72 hours. Thyroid function studies: No results for input(s): TSH, T4TOTAL, T3FREE, THYROIDAB in the last 72 hours.  Invalid input(s): FREET3 Anemia work up: No results for input(s): VITAMINB12, FOLATE, FERRITIN, TIBC, IRON, RETICCTPCT in the last 72 hours. Sepsis Labs: Recent Labs  Lab 01/12/20 1256 01/12/20 1521 01/12/20 1757 01/13/20 0200 01/13/20 0518 01/14/20 0529  WBC 21.1* 27.4*  --   --  33.2* 10.7*  LATICACIDVEN 7.1* 5.8* 3.5* 1.9  --   --     Microbiology Recent Results (from the past 240 hour(s))  Blood Culture (routine x 2)     Status: None (Preliminary result)   Collection Time: 01/12/20 12:56 PM   Specimen: BLOOD  Result Value Ref Range Status   Specimen Description BLOOD SITE NOT  SPECIFIED  Final   Special Requests   Final    BOTTLES DRAWN AEROBIC AND ANAEROBIC Blood Culture results may not be optimal due to an inadequate volume of blood received in culture bottles   Culture  Setup Time   Final    GRAM NEGATIVE RODS GRAM POSITIVE COCCI IN CHAINS IN BOTH AEROBIC AND ANAEROBIC BOTTLES GRAM POSITIVE COCCI IN CLUSTERS AEROBIC BOTTLE ONLY CRITICAL RESULT CALLED TO, READ BACK BY AND VERIFIED WITH: J. LEDFORD,PHARMD  1610 01/13/2020 T. TYSOR    Culture   Final    Romie Minus NEGATIVE RODS Romie Minus POSITIVE COCCI CULTURE REINCUBATED FOR BETTER GROWTH Performed at [facility], [address], [place], [place] [address]    Report Status PENDING  Incomplete  Blood Culture (routine x 2)     Status: None (Preliminary result)   Collection Time: 01/12/20 12:56 PM   Specimen: BLOOD LEFT HAND  Result Value Ref Range Status   Specimen Description BLOOD LEFT HAND  Final   Special Requests   Final    BOTTLES DRAWN AEROBIC AND ANAEROBIC Blood Culture adequate volume   Culture  Setup Time   Final    GRAM NEGATIVE RODS IN BOTH AEROBIC AND ANAEROBIC BOTTLES CRITICAL VALUE NOTED.  VALUE IS CONSISTENT WITH PREVIOUSLY REPORTED AND CALLED VALUE.    Culture   Final    GRAM NEGATIVE RODS CULTURE REINCUBATED FOR BETTER GROWTH Performed at [facility], [address] [place], [place] [address]    Report Status PENDING  Incomplete  Urine culture     Status: Abnormal (Preliminary result)   Collection Time: 01/12/20 12:56 PM   Specimen: In/Out Cath Urine  Result Value Ref Range Status   Specimen Description IN/OUT CATH URINE  Final   Special Requests NONE  Final   Culture (A)  Final    >=100,000 COLONIES/mL PROTEUS MIRABILIS CULTURE REINCUBATED FOR BETTER GROWTH    Report Status PENDING  Incomplete   Organism ID, Bacteria PROTEUS MIRABILIS (A)  Final      Susceptibility   Proteus mirabilis - MIC*    AMPICILLIN <=2 SENSITIVE Sensitive     CEFAZOLIN <=4 SENSITIVE Sensitive      CEFEPIME <=0.12 SENSITIVE Sensitive     CEFTRIAXONE <=0.25 SENSITIVE Sensitive     CIPROFLOXACIN <=0.25 SENSITIVE Sensitive     GENTAMICIN <=1 SENSITIVE Sensitive     IMIPENEM 2 SENSITIVE Sensitive     NITROFURANTOIN RESISTANT Resistant     TRIMETH/SULFA <=20 SENSITIVE Sensitive     AMPICILLIN/SULBACTAM <=2 SENSITIVE Sensitive     PIP/TAZO Value in next row Sensitive      <=4 SENSITIVEPerformed at [facility], [address] [place], [place] [address]    * >=100,000 COLONIES/mL PROTEUS MIRABILIS  Resp Panel by RT-PCR (Flu A&B, Covid) Nasopharyngeal Swab     Status: None   Collection Time: 01/12/20 12:56 PM   Specimen: Nasopharyngeal Swab; Nasopharyngeal(NP) swabs in vial transport medium  Result Value Ref Range Status   SARS Coronavirus 2 by RT PCR NEGATIVE NEGATIVE Final    Comment: (NOTE) SARS-CoV-2 target nucleic acids are NOT DETECTED.  The SARS-CoV-2 RNA is generally detectable in upper respiratory specimens during the acute phase of infection. The lowest concentration of SARS-CoV-2 viral copies this assay can detect is 138 copies/mL. A negative result does not preclude SARS-Cov-2 infection and should not be used as the sole basis for treatment or other patient management decisions. A negative result may occur with  improper specimen collection/handling, submission of specimen other than nasopharyngeal swab, presence of viral mutation(s) within the areas targeted by this assay, and inadequate number of viral copies(<138 copies/mL). A negative result must be combined with clinical observations, patient history, and epidemiological information. The expected result is Negative.  Fact Sheet for Patients:  BloggerCourse.com  Fact Sheet for Healthcare Providers:  SeriousBroker.it  This test is no t yet approved or cleared by the [place] FDA and  has been authorized for detection and/or  diagnosis of SARS-CoV-2  by FDA under an Emergency Use Authorization (EUA). This EUA will remain  in effect (meaning this test can be used) for the duration of the COVID-19 declaration under Section 564(b)(1) of the Act, 21 U.S.C.section 360bbb-3(b)(1), unless the authorization is terminated  or revoked sooner.       Influenza A by PCR NEGATIVE NEGATIVE Final   Influenza B by PCR NEGATIVE NEGATIVE Final    Comment: (NOTE) The Xpert Xpress SARS-CoV-2/FLU/RSV plus assay is intended as an aid in the diagnosis of influenza from Nasopharyngeal swab specimens and should not be used as a sole basis for treatment. Nasal washings and aspirates are unacceptable for Xpert Xpress SARS-CoV-2/FLU/RSV testing.  Fact Sheet for Patients: BloggerCourse.com  Fact Sheet for Healthcare Providers: SeriousBroker.it  This test is not yet approved or cleared by the [place] FDA and has been authorized for detection and/or diagnosis of SARS-CoV-2 by FDA under an Emergency Use Authorization (EUA). This EUA will remain in effect (meaning this test can be used) for the duration of the COVID-19 declaration under Section 564(b)(1) of the Act, 21 U.S.C. section 360bbb-3(b)(1), unless the authorization is terminated or revoked.  Performed at [facility], [address], [place], [place] [address]   Blood Culture ID Panel (Reflexed)     Status: Abnormal   Collection Time: 01/12/20 12:56 PM  Result Value Ref Range Status   Enterococcus faecalis DETECTED (A) NOT DETECTED Final    Comment: CRITICAL RESULT CALLED TO, READ BACK BY AND VERIFIED WITH: J. LEDFORD,PHARMD [id number] 01/13/2020 T. TYSOR    Enterococcus Faecium NOT DETECTED NOT DETECTED Final   Listeria monocytogenes NOT DETECTED NOT DETECTED Final   Staphylococcus species DETECTED (A) NOT DETECTED Final    Comment: CRITICAL RESULT CALLED TO, READ BACK BY AND VERIFIED WITH: J. LEDFORD,PHARMD [id number] 01/13/2020 T. TYSOR     Staphylococcus aureus (BCID) NOT DETECTED NOT DETECTED Final   Staphylococcus epidermidis NOT DETECTED NOT DETECTED Final   Staphylococcus lugdunensis NOT DETECTED NOT DETECTED Final   Streptococcus species NOT DETECTED NOT DETECTED Final   Streptococcus agalactiae NOT DETECTED NOT DETECTED Final   Streptococcus pneumoniae NOT DETECTED NOT DETECTED Final   Streptococcus pyogenes NOT DETECTED NOT DETECTED Final   A.calcoaceticus-baumannii NOT DETECTED NOT DETECTED Final   Bacteroides fragilis NOT DETECTED NOT DETECTED Final   Enterobacterales DETECTED (A) NOT DETECTED Final    Comment: Enterobacterales represent a large order of gram negative bacteria, not a single organism. CRITICAL RESULT CALLED TO, READ BACK BY AND VERIFIED WITH: J. LEDFORD,PHARMD 1191 01/13/2020 T. TYSOR    Enterobacter cloacae complex NOT DETECTED NOT DETECTED Final   Escherichia coli NOT DETECTED NOT DETECTED Final   Klebsiella aerogenes NOT DETECTED NOT DETECTED Final   Klebsiella oxytoca NOT DETECTED NOT DETECTED Final   Klebsiella pneumoniae NOT DETECTED NOT DETECTED Final   Proteus species DETECTED (A) NOT DETECTED Final    Comment: CRITICAL RESULT CALLED TO, READ BACK BY AND VERIFIED WITH: J. LEDFORD,PHARMD 4782 01/13/2020 T. TYSOR    Salmonella species NOT DETECTED NOT DETECTED Final   Serratia marcescens NOT DETECTED NOT DETECTED Final   Haemophilus influenzae NOT DETECTED NOT DETECTED Final   Neisseria meningitidis NOT DETECTED NOT DETECTED Final   Pseudomonas aeruginosa DETECTED (A) NOT DETECTED Final    Comment: CRITICAL RESULT CALLED TO, READ BACK BY AND VERIFIED WITH: J. LEDFORD,PHARMD [id number] 01/13/2020 T. TYSOR    Stenotrophomonas maltophilia NOT DETECTED NOT DETECTED Final   Candida albicans NOT DETECTED NOT  DETECTED Final   Candida auris NOT DETECTED NOT DETECTED Final   Candida glabrata NOT DETECTED NOT DETECTED Final   Candida krusei NOT DETECTED NOT DETECTED Final   Candida parapsilosis NOT  DETECTED NOT DETECTED Final   Candida tropicalis NOT DETECTED NOT DETECTED Final   Cryptococcus neoformans/gattii NOT DETECTED NOT DETECTED Final   CTX-M ESBL NOT DETECTED NOT DETECTED Final   Carbapenem resistance IMP NOT DETECTED NOT DETECTED Final   Carbapenem resistance KPC NOT DETECTED NOT DETECTED Final   Carbapenem resistance NDM NOT DETECTED NOT DETECTED Final   Carbapenem resist OXA 48 LIKE NOT DETECTED NOT DETECTED Final   Vancomycin resistance NOT DETECTED NOT DETECTED Final   Carbapenem resistance VIM NOT DETECTED NOT DETECTED Final    Comment: Performed at [facility], [address] [place], [place] [address]  MRSA PCR Screening     Status: None   Collection Time: 02/05/2020  9:10 PM   Specimen: Nasal Mucosa; Nasopharyngeal  Result Value Ref Range Status   MRSA by PCR NEGATIVE NEGATIVE Final    Comment:        The GeneXpert MRSA Assay (FDA approved for NASAL specimens only), is one component of a comprehensive MRSA colonization surveillance program. It is not intended to diagnose MRSA infection nor to guide or monitor treatment for MRSA infections. Performed at [facility], [address] [place], [place] [address]     Procedures and diagnostic studies:  US RENAL  Result Date: 02-05-2020 CLINICAL DATA:  Acute renal insufficiency EXAM: RENAL / URINARY TRACT ULTRASOUND COMPLETE COMPARISON:  None. FINDINGS: Right Kidney: Renal measurements: 10.2 x 5.4 x 5.6 cm = volume: 162 mL. Several stones seen in the right kidney with the largest measuring 24 mm. No hydronephrosis. Left Kidney: Renal measurements: 11 x 6 x 5 cm = volume: 173.5 mL. Contains a 15 mm nonobstructive stone. Bladder: The bladder is partially decompressed with a Foley catheter. Other: None. IMPRESSION: Multiple shadowing nonobstructive stones in the kidneys. The bladder is partially decompressed with a Foley catheter. Electronically Signed   By: Gerome Sam III M.D   On: 2020-02-05 16:29    DG Chest Port 1 View  Result Date: 02/05/2020 CLINICAL DATA:  Questionable sepsis EXAM: PORTABLE CHEST 1 VIEW COMPARISON:  12/13/2017 chest radiograph. FINDINGS: Stable cardiomediastinal silhouette with normal heart size. No pneumothorax. No pleural effusion. Mild patchy hazy opacities in lower lungs bilaterally with low lung volumes. IMPRESSION: Low lung volumes with mild patchy hazy opacities in the lower lungs bilaterally, which could represent atelectasis, aspiration or pneumonia. Chest radiograph follow-up suggested. Electronically Signed   By: Delbert Phenix M.D.   On: 05-Feb-2020 15:10   ECHOCARDIOGRAM COMPLETE  Result Date: 01/13/2020    ECHOCARDIOGRAM REPORT   Patient Name:   Travis Cantrell Date of Exam: 01/13/2020 Medical Rec #:  [id number]     Height:       70.0 in Accession #:    [id number]    Weight:       164.7 lb Date of Birth:  1960-03-06     BSA:          1.922 m Patient Age:    59 years      BP:           109/74 mmHg Patient Gender: M             HR:           65 bpm. Exam Location:  Inpatient Procedure:  2D Echo, Cardiac Doppler and Color Doppler Indications:    Sepsis  History:        Patient has prior history of Echocardiogram examinations, most                 recent 02/12/2015. Risk Factors:Hypertension. GERD. Multiple                 sclerosis.  Sonographer:    Ross Ludwig RDCS (AE) Referring Phys: [id number] [facility]  Sonographer Comments: Suboptimal subcostal window. IMPRESSIONS  1. Left ventricular ejection fraction, by estimation, is 65 to 70%. The left ventricle has normal function. The left ventricle has no regional wall motion abnormalities. There is moderate left ventricular hypertrophy. Left ventricular diastolic parameters are consistent with Grade I diastolic dysfunction (impaired relaxation).  2. Prominent moderator band. Right ventricular systolic function is normal. The right ventricular size is normal.  3. The mitral valve is abnormal. Mild mitral valve regurgitation.  4.  Possible mobile vegetation on the septal leaflet. The tricuspid valve is abnormal.  5. The aortic valve is tricuspid. Aortic valve regurgitation is not visualized. Comparison(s): Changes from prior study are noted. 02/12/15: LVEF >75%. Conclusion(s)/Recommendation(s): Findings concerning for possible tricuspid valve vegetation, would recommend a Transesophageal Echocardiogram for clarification. FINDINGS  Left Ventricle: Left ventricular ejection fraction, by estimation, is 65 to 70%. The left ventricle has normal function. The left ventricle has no regional wall motion abnormalities. The left ventricular internal cavity size was normal in size. There is  moderate left ventricular hypertrophy. Left ventricular diastolic parameters are consistent with Grade I diastolic dysfunction (impaired relaxation). Indeterminate filling pressures. Right Ventricle: Prominent moderator band. The right ventricular size is normal. No increase in right ventricular wall thickness. Right ventricular systolic function is normal. Left Atrium: Left atrial size was normal in size. Right Atrium: Right atrial size was normal in size. Pericardium: There is no evidence of pericardial effusion. Mitral Valve: The mitral valve is abnormal. There is mild thickening of the mitral valve leaflet(s). Mild mitral valve regurgitation. Tricuspid Valve: Possible mobile vegetation on the septal leaflet vs. possible partially flail cord. The tricuspid valve is abnormal. Tricuspid valve regurgitation is trivial. Aortic Valve: The aortic valve is tricuspid. Aortic valve regurgitation is not visualized. Aortic valve mean gradient measures 4.0 mmHg. Aortic valve peak gradient measures 6.2 mmHg. Aortic valve area, by VTI measures 3.22 cm. Pulmonic Valve: The pulmonic valve was normal in structure. Pulmonic valve regurgitation is not visualized. Aorta: The aortic root and ascending aorta are structurally normal, with no evidence of dilitation. IAS/Shunts: No  atrial level shunt detected by color flow Doppler.  LEFT VENTRICLE PLAX 2D LVIDd:         4.40 cm  Diastology LVIDs:         3.10 cm  LV e' medial:    7.94 cm/s LV PW:         1.20 cm  LV E/e' medial:  8.4 LV IVS:        1.40 cm  LV e' lateral:   11.10 cm/s LVOT diam:     2.40 cm  LV E/e' lateral: 6.0 LV SV:         68 LV SV Index:   36 LVOT Area:     4.52 cm  RIGHT VENTRICLE RV S prime:     16.50 cm/s TAPSE (M-mode): 1.8 cm LEFT ATRIUM             Index       RIGHT ATRIUM  Index LA diam:        2.50 cm 1.30 cm/m  RA Area:     10.10 cm LA Vol (A2C):   40.5 ml 21.07 ml/m RA Volume:   18.60 ml  9.68 ml/m LA Vol (A4C):   36.4 ml 18.94 ml/m LA Biplane Vol: 39.5 ml 20.55 ml/m  AORTIC VALVE AV Area (Vmax):    3.21 cm AV Area (Vmean):   2.92 cm AV Area (VTI):     3.22 cm AV Vmax:           124.00 cm/s AV Vmean:          92.100 cm/s AV VTI:            0.212 m AV Peak Grad:      6.2 mmHg AV Mean Grad:      4.0 mmHg LVOT Vmax:         88.10 cm/s LVOT Vmean:        59.400 cm/s LVOT VTI:          0.151 m LVOT/AV VTI ratio: 0.71  AORTA Ao Root diam: 3.50 cm Ao Asc diam:  3.10 cm MITRAL VALVE               TRICUSPID VALVE MV Area (PHT): 2.42 cm    TR Peak grad:   16.0 mmHg MV Decel Time: 313 msec    TR Vmax:        200.00 cm/s MV E velocity: 66.40 cm/s MV A velocity: 51.80 cm/s  SHUNTS MV E/A ratio:  1.28        Systemic VTI:  0.15 m                            Systemic Diam: 2.40 cm Zoila Shutter MD Electronically signed by Zoila Shutter MD Signature Date/Time: 01/13/2020/5:16:57 PM    Final     Medications:   . Chlorhexidine Gluconate Cloth  6 each Topical Daily  . heparin  5,000 Units Subcutaneous Q8H  . insulin aspart  0-9 Units Subcutaneous Q4H  . polyvinyl alcohol  2 drop Both Eyes Q4H   Continuous Infusions: . ceFEPime (MAXIPIME) IV Stopped (01/13/20 1505)  . DAPTOmycin (CUBICIN)  IV 620 mg (01/14/20 1207)  . norepinephrine (LEVOPHED) Adult infusion Stopped (01/13/20 0906)     LOS: 2 days    Joseph Art  [facility]   How to contact the [facility] Attending or Consulting provider 7A - 7P or covering provider during after hours 7P -7A, for this patient?  1. Check the care team in [facility] and look for a) attending/consulting [facility] provider listed and b) the [facility] team listed 2. Log into [email or web address] and use [place]'s universal password to access. If you do not have the password, please contact the hospital operator. 3. Locate the [facility] provider you are looking for under [facility] and page to a number that you can be directly reached. 4. If you still have difficulty reaching the provider, please page the [facility] (Director on Call) for the Hospitalists listed on amion for assistance.  01/14/2020, 1:19 PM

## 2020-01-14 NOTE — Progress Notes (Signed)
[facility] Infectious Diseases Follow Up Note  Patient Identification: Patient Name: Travis Cantrell MRN: [id number] Admit Date: 01/12/2020 12:35 PM Age: 59 y.o.Today's Date: 01/14/2020   Reason for Visit: Follow up on bacteremia   Active Problems:   Septic shock (HCC)   Pressure injury of skin  Antibiotics: Vancomycin Day 3 and cefepime day 3   Interval Events: Rt femoral catheter removed, TTE with concerns of vegetation in septal leaflet. Planned to send to Medical floor today   Assessment 11 Y O Male from NH with PMH of MS, GERD, HTN, quadriplegic, bedbound with decubitus ulcer and indwelling foleys catheter admitted with  # Polymicrobial Bacteremia ( Staph species, E faecalis, Pseudomonas aeruginosa, Proteus): Most likely source is Urinary given h/o burning urination, suprapubic tenderness on exam and h/o clogged Foleys. -UA turbid, , large leukocytes, positive nitrite, WBC >50 -Urine cx grew >100K Proteus mirabilis   # Stage 1 Sacral Decubitus Ulcers - Does not appear to be infected currently   # SOB/Cough/Hypoxia - Patient is off nasal cannula, appears to be comfortable with no SOB and cough. Low suspicion for PNA  # Leukocytosis - will monitor now, patient appears to be stable   Recommendations  Continue cefepime  Will change Vancomycin to Daptomycin given AKI  F/u blood cultures 12/21 TEE given concern of vegetations in the TTE ? Urology evaluation given multiple kidney stones  Baseline CPK ( ordered)  Monitor CBC, CMP and CPK while on IV abx  Following   Rest of the management as per the primary team. Thank you for the consult. Please page with pertinent questions or concerns.  ______________________________________________________________________ Subjective patient seen and examined at the bedside. He is lying in bed, off of nasal cannula. He appears comfortable. Denies any complaints today. He says he  spoke to someone for changing his current SNF.    Vitals BP 102/77   Pulse 86   Temp 97.9 F (36.6 C) (Oral)   Resp (!) 25   Ht 5\' 10"  (1.778 m)   Wt 77.5 kg   SpO2 93%   BMI 24.52 kg/m     Physical Exam Constitutional:  Comments: Not in acute disress, on 4L oxygen ( new)   Cardiovascular:  Rate and Rhythm: Normal rateand regular rhythm.  Heart sounds: No murmurheard.   Pulmonary:  Effort: Pulmonary effort is normal.  Comments:   Abdominal:  Palpations: Abdomen is distended, BS+ Tenderness: suprapubic tenderness   Musculoskeletal:    Skin: Comments: Nop obvious rashes, lesions  Neurological:  General: quadriplegic   Psychiatric:  Mood and Affect: Moodnormal.    LINES/TUBES:   METAL IMPLANT/HARDWARE:  Pertinent Microbiology Results for orders placed or performed during the hospital encounter of 01/12/20  Blood Culture (routine x 2)     Status: None (Preliminary result)   Collection Time: 01/12/20 12:56 PM   Specimen: BLOOD  Result Value Ref Range Status   Specimen Description BLOOD SITE NOT SPECIFIED  Final   Special Requests   Final    BOTTLES DRAWN AEROBIC AND ANAEROBIC Blood Culture results may not be optimal due to an inadequate volume of blood received in culture bottles   Culture  Setup Time   Final    GRAM NEGATIVE RODS GRAM POSITIVE COCCI IN CHAINS IN BOTH AEROBIC AND ANAEROBIC BOTTLES GRAM POSITIVE COCCI IN CLUSTERS AEROBIC BOTTLE ONLY CRITICAL RESULT CALLED TO, READ BACK BY AND VERIFIED WITH: J. LEDFORD,PHARMD 01/14/20 01/13/2020 T. TYSOR    Culture   Final    01/15/2020  NEGATIVE RODS GRAM POSITIVE COCCI CULTURE REINCUBATED FOR BETTER GROWTH Performed at [facility], [address], [place], [place] [address]    Report Status PENDING  Incomplete  Blood Culture (routine x 2)     Status: None (Preliminary result)   Collection Time: 01/12/20 12:56 PM   Specimen: BLOOD LEFT HAND  Result Value  Ref Range Status   Specimen Description BLOOD LEFT HAND  Final   Special Requests   Final    BOTTLES DRAWN AEROBIC AND ANAEROBIC Blood Culture adequate volume   Culture  Setup Time   Final    GRAM NEGATIVE RODS IN BOTH AEROBIC AND ANAEROBIC BOTTLES CRITICAL VALUE NOTED.  VALUE IS CONSISTENT WITH PREVIOUSLY REPORTED AND CALLED VALUE.    Culture   Final    GRAM NEGATIVE RODS CULTURE REINCUBATED FOR BETTER GROWTH Performed at [facility], [address], [place], [place] [address]    Report Status PENDING  Incomplete  Urine culture     Status: Abnormal (Preliminary result)   Collection Time: 01/12/20 12:56 PM   Specimen: In/Out Cath Urine  Result Value Ref Range Status   Specimen Description IN/OUT CATH URINE  Final   Special Requests NONE  Final   Culture (A)  Final    >=100,000 COLONIES/mL PROTEUS MIRABILIS CULTURE REINCUBATED FOR BETTER GROWTH Performed at [facility], [address], [place], [place] [address]    Report Status PENDING  Incomplete  Resp Panel by RT-PCR (Flu A&B, Covid) Nasopharyngeal Swab     Status: None   Collection Time: 01/12/20 12:56 PM   Specimen: Nasopharyngeal Swab; Nasopharyngeal(NP) swabs in vial transport medium  Result Value Ref Range Status   SARS Coronavirus 2 by RT PCR NEGATIVE NEGATIVE Final    Comment: (NOTE) SARS-CoV-2 target nucleic acids are NOT DETECTED.  The SARS-CoV-2 RNA is generally detectable in upper respiratory specimens during the acute phase of infection. The lowest concentration of SARS-CoV-2 viral copies this assay can detect is 138 copies/mL. A negative result does not preclude SARS-Cov-2 infection and should not be used as the sole basis for treatment or other patient management decisions. A negative result may occur with  improper specimen collection/handling, submission of specimen other than nasopharyngeal swab, presence of viral mutation(s) within the areas targeted by this assay, and inadequate number of  viral copies(<138 copies/mL). A negative result must be combined with clinical observations, patient history, and epidemiological information. The expected result is Negative.  Fact Sheet for Patients:  BloggerCourse.com  Fact Sheet for Healthcare Providers:  SeriousBroker.it  This test is no t yet approved or cleared by the [place] FDA and  has been authorized for detection and/or diagnosis of SARS-CoV-2 by FDA under an Emergency Use Authorization (EUA). This EUA will remain  in effect (meaning this test can be used) for the duration of the COVID-19 declaration under Section 564(b)(1) of the Act, 21 U.S.C.section 360bbb-3(b)(1), unless the authorization is terminated  or revoked sooner.       Influenza A by PCR NEGATIVE NEGATIVE Final   Influenza B by PCR NEGATIVE NEGATIVE Final    Comment: (NOTE) The Xpert Xpress SARS-CoV-2/FLU/RSV plus assay is intended as an aid in the diagnosis of influenza from Nasopharyngeal swab specimens and should not be used as a sole basis for treatment. Nasal washings and aspirates are unacceptable for Xpert Xpress SARS-CoV-2/FLU/RSV testing.  Fact Sheet for Patients: BloggerCourse.com  Fact Sheet for Healthcare Providers: SeriousBroker.it  This test is not yet approved  or cleared by the [place] and has been authorized for detection and/or diagnosis of SARS-CoV-2 by FDA under an Emergency Use Authorization (EUA). This EUA will remain in effect (meaning this test can be used) for the duration of the COVID-19 declaration under Section 564(b)(1) of the Act, 21 U.S.C. section 360bbb-3(b)(1), unless the authorization is terminated or revoked.  Performed at [facility], [address] [place], [place] [address]   Blood Culture ID Panel (Reflexed)     Status: Abnormal   Collection Time: 01/12/20 12:56 PM  Result Value Ref  Range Status   Enterococcus faecalis DETECTED (A) NOT DETECTED Final    Comment: CRITICAL RESULT CALLED TO, READ BACK BY AND VERIFIED WITH: J. LEDFORD,PHARMD [id number] 01/13/2020 T. TYSOR    Enterococcus Faecium NOT DETECTED NOT DETECTED Final   Listeria monocytogenes NOT DETECTED NOT DETECTED Final   Staphylococcus species DETECTED (A) NOT DETECTED Final    Comment: CRITICAL RESULT CALLED TO, READ BACK BY AND VERIFIED WITH: J. LEDFORD,PHARMD [id number] 01/13/2020 T. TYSOR    Staphylococcus aureus (BCID) NOT DETECTED NOT DETECTED Final   Staphylococcus epidermidis NOT DETECTED NOT DETECTED Final   Staphylococcus lugdunensis NOT DETECTED NOT DETECTED Final   Streptococcus species NOT DETECTED NOT DETECTED Final   Streptococcus agalactiae NOT DETECTED NOT DETECTED Final   Streptococcus pneumoniae NOT DETECTED NOT DETECTED Final   Streptococcus pyogenes NOT DETECTED NOT DETECTED Final   A.calcoaceticus-baumannii NOT DETECTED NOT DETECTED Final   Bacteroides fragilis NOT DETECTED NOT DETECTED Final   Enterobacterales DETECTED (A) NOT DETECTED Final    Comment: Enterobacterales represent a large order of gram negative bacteria, not a single organism. CRITICAL RESULT CALLED TO, READ BACK BY AND VERIFIED WITH: J. LEDFORD,PHARMD [id number] 01/13/2020 T. TYSOR    Enterobacter cloacae complex NOT DETECTED NOT DETECTED Final   Escherichia coli NOT DETECTED NOT DETECTED Final   Klebsiella aerogenes NOT DETECTED NOT DETECTED Final   Klebsiella oxytoca NOT DETECTED NOT DETECTED Final   Klebsiella pneumoniae NOT DETECTED NOT DETECTED Final   Proteus species DETECTED (A) NOT DETECTED Final    Comment: CRITICAL RESULT CALLED TO, READ BACK BY AND VERIFIED WITH: J. LEDFORD,PHARMD [id number] 01/13/2020 T. TYSOR    Salmonella species NOT DETECTED NOT DETECTED Final   Serratia marcescens NOT DETECTED NOT DETECTED Final   Haemophilus influenzae NOT DETECTED NOT DETECTED Final   Neisseria meningitidis NOT DETECTED NOT  DETECTED Final   Pseudomonas aeruginosa DETECTED (A) NOT DETECTED Final    Comment: CRITICAL RESULT CALLED TO, READ BACK BY AND VERIFIED WITH: J. LEDFORD,PHARMD [id number] 01/13/2020 T. TYSOR    Stenotrophomonas maltophilia NOT DETECTED NOT DETECTED Final   Candida albicans NOT DETECTED NOT DETECTED Final   Candida auris NOT DETECTED NOT DETECTED Final   Candida glabrata NOT DETECTED NOT DETECTED Final   Candida krusei NOT DETECTED NOT DETECTED Final   Candida parapsilosis NOT DETECTED NOT DETECTED Final   Candida tropicalis NOT DETECTED NOT DETECTED Final   Cryptococcus neoformans/gattii NOT DETECTED NOT DETECTED Final   CTX-M ESBL NOT DETECTED NOT DETECTED Final   Carbapenem resistance IMP NOT DETECTED NOT DETECTED Final   Carbapenem resistance KPC NOT DETECTED NOT DETECTED Final   Carbapenem resistance NDM NOT DETECTED NOT DETECTED Final   Carbapenem resist OXA 48 LIKE NOT DETECTED NOT DETECTED Final   Vancomycin resistance NOT DETECTED NOT DETECTED Final   Carbapenem resistance VIM NOT DETECTED NOT DETECTED Final    Comment: Performed at [facility], [address], [place], [place] [address]  MRSA PCR Screening     Status: None   Collection Time: 01/12/20  9:10 PM   Specimen: Nasal Mucosa; Nasopharyngeal  Result Value Ref Range Status   MRSA by PCR NEGATIVE NEGATIVE Final    Comment:        The GeneXpert MRSA Assay (FDA approved for NASAL specimens only), is one component of a comprehensive MRSA colonization surveillance program. It is not intended to diagnose MRSA infection nor to guide or monitor treatment for MRSA infections. Performed at [facility], [address] [place], [place] [address]       Pertinent Lab. CBC Latest Ref Rng & Units 01/13/2020 01/12/2020 01/12/2020  WBC 4.0 - 10.5 K/uL 33.2(H) 27.4(H) 21.1(H)  Hemoglobin 13.0 - 17.0 g/dL 11.5(L) 11.7(L) 15.1  Hematocrit 39.0 - 52.0 % 36.5(L) 38.3(L) 48.4  Platelets 150 - 400 K/uL 287 256 340    CMP Latest Ref Rng & Units 01/14/2020 01/13/2020 01/12/2020  Glucose 70 - 99 mg/dL 88 191(Y) 782(N)  BUN 6 - 20 mg/dL 56(O) 13(Y) 86(V)  Creatinine 0.61 - 1.24 mg/dL 7.84(O) 9.62(X) 5.28(U)  Sodium 135 - 145 mmol/L 142 144 141  Potassium 3.5 - 5.1 mmol/L 3.7 4.0 4.2  Chloride 98 - 111 mmol/L 110 108 107  CO2 22 - 32 mmol/L 23 24 14(L)  Calcium 8.9 - 10.3 mg/dL 8.0(L) 8.3(L) 8.9  Total Protein 6.5 - 8.1 g/dL - - 6.7  Total Bilirubin 0.3 - 1.2 mg/dL - - 0.8  Alkaline Phos 38 - 126 U/L - - 152(H)  AST 15 - 41 U/L - - 24  ALT 0 - 44 U/L - - 18    Pertinent Imaging today Plain films and CT images have been personally visualized and interpreted; radiology reports have been reviewed. Decision making incorporated into the Impression / Recommendations.  I have spent approx 30 minutes for this patient encounter including review of prior medical records with greater than 50% of time being face to face and coordination of their care.  Electronically signed by:   Odette Fraction, MD Infectious Disease Physician [facility] for Infectious Disease Pager: [phone]

## 2020-01-14 NOTE — Progress Notes (Signed)
Pharmacy Antibiotic Note  Travis Cantrell is a 59 y.o. male admitted on 01/12/2020 with sepsis.  Multiple bacteria in blood cx  TTE shows likely mobile vegetation on mitral valve  Random vanc lvl 9 - renal fx has improved since admit  Height: 5\' 10"  (177.8 cm) Weight: 77.5 kg (170 lb 13.7 oz) IBW/kg (Calculated) : 73  Temp (24hrs), Avg:98.4 F (36.9 C), Min:97.8 F (36.6 C), Max:99.5 F (37.5 C)  Recent Labs  Lab 01/12/20 1256 01/12/20 1521 01/12/20 1757 01/13/20 0200 01/13/20 0518 01/14/20 0529  WBC 21.1* 27.4*  --   --  33.2*  --   CREATININE 4.71*  --   --   --  2.49*  --   LATICACIDVEN 7.1* 5.8* 3.5* 1.9  --   --   VANCORANDOM  --   --   --   --   --  9    Estimated Creatinine Clearance: 33 mL/min (A) (by C-G formula based on SCr of 2.49 mg/dL (H)).    Allergies  Allergen Reactions  . Baclofen Diarrhea   Plan:  - Cefepime 2g IV q24h - Vancomycin 1 g q24h - monitor repeat cx lvls prn renal fx - f/u ID recs  01/16/20, PharmD, BCPS, BCCCP Clinical Pharmacist [phone]  Please check AMION for all [facility] Pharmacy numbers  01/14/2020 7:38 AM

## 2020-01-15 DIAGNOSIS — R6521 Severe sepsis with septic shock: Secondary | ICD-10-CM | POA: Diagnosis not present

## 2020-01-15 DIAGNOSIS — G35 Multiple sclerosis: Secondary | ICD-10-CM

## 2020-01-15 DIAGNOSIS — A419 Sepsis, unspecified organism: Secondary | ICD-10-CM | POA: Diagnosis not present

## 2020-01-15 LAB — CBC
HCT: 35.7 % — ABNORMAL LOW (ref 39.0–52.0)
Hemoglobin: 11.6 g/dL — ABNORMAL LOW (ref 13.0–17.0)
MCH: 29.1 pg (ref 26.0–34.0)
MCHC: 32.5 g/dL (ref 30.0–36.0)
MCV: 89.7 fL (ref 80.0–100.0)
Platelets: 202 10*3/uL (ref 150–400)
RBC: 3.98 MIL/uL — ABNORMAL LOW (ref 4.22–5.81)
RDW: 14.5 % (ref 11.5–15.5)
WBC: 8.4 10*3/uL (ref 4.0–10.5)
nRBC: 0 % (ref 0.0–0.2)

## 2020-01-15 LAB — GLUCOSE, CAPILLARY
Glucose-Capillary: 89 mg/dL (ref 70–99)
Glucose-Capillary: 86 mg/dL (ref 70–99)
Glucose-Capillary: 97 mg/dL (ref 70–99)
Glucose-Capillary: 122 mg/dL — ABNORMAL HIGH (ref 70–99)
Glucose-Capillary: 119 mg/dL — ABNORMAL HIGH (ref 70–99)

## 2020-01-15 LAB — BASIC METABOLIC PANEL
Anion gap: 11 (ref 5–15)
BUN: 25 mg/dL — ABNORMAL HIGH (ref 6–20)
CO2: 24 mmol/L (ref 22–32)
Calcium: 8.2 mg/dL — ABNORMAL LOW (ref 8.9–10.3)
Chloride: 107 mmol/L (ref 98–111)
Creatinine, Ser: 1.2 mg/dL (ref 0.61–1.24)
GFR, Estimated: >60 mL/min (ref >60)
Glucose, Bld: 97 mg/dL (ref 70–99)
Potassium: 3.5 mmol/L (ref 3.5–5.1)
Sodium: 142 mmol/L (ref 135–145)

## 2020-01-15 LAB — CK: Total CK: 17 U/L — ABNORMAL LOW (ref 49–397)

## 2020-01-15 NOTE — Evaluation (Signed)
Physical Therapy Evaluation Patient Details Name: Travis Cantrell MRN: [id number] DOB: 1960-08-11 Today's Date: 01/15/2020   History of Present Illness  59 Y O Male from NH with PMH of MS, GERD, HTN, quadriplegic, bedbound with decubitus ulcer and indwelling foleys catheter who presented to the ED 12/19 with complaints of nausea and vomiting for 2 days. He also had leaking around his Foleys. And complained of some SOB and nonproductive cough during admission. Found to be septic and admitted for treatment of septic shock.  Clinical Impression  Pt from [facility] where he required total A for mobility, ADLs and iADLs. Pt reports he would like to find new SNF as he feels he is not getting the care he should, for instance he reports having splints for his hands and elbows that need to be placed daily and that has not been done for weeks. Pt with increased contracture in all joints that improves with PROM. Pt feels that he could do more if he had more consistent therapy. PT recommending return to SNF level care at different facility if at all possible. PT will continue to work with him acutely.     Follow Up Recommendations SNF    Equipment Recommendations  None recommended by PT       Precautions / Restrictions Restrictions Weight Bearing Restrictions: No      Mobility  Bed Mobility Overal bed mobility: Needs Assistance Bed Mobility: Rolling Rolling: Total assist         General bed mobility comments: total A for rolling to reposition to change pressure on sacrum              Pertinent Vitals/Pain Pain Assessment: No/denies pain    Home Living Family/patient expects to be discharged to:: Skilled nursing facility                 Additional Comments: from [facility], however would like to go elsewhere as he is not getting the care he desires, especially has bilateral hand splints but no one ever puts them on    Prior Function Level of Independence: Needs assistance    Gait / Transfers Assistance Needed: total Assist for all mobility, mechanical lift however does not get OOB at SNF (not his choosing)  ADL's / Homemaking Assistance Needed: dependent for all ADLs/iADLs        Hand Dominance   Dominant Hand: Left    Extremity/Trunk Assessment   Upper Extremity Assessment Upper Extremity Assessment: RUE deficits/detail;LUE deficits/detail RUE Deficits / Details: contraction of all joints, loosen somewhat with repetitive PROM LUE Deficits / Details: contraction of all joints, loosen somewhat with repetitive PROM    Lower Extremity Assessment Lower Extremity Assessment: RLE deficits/detail;LLE deficits/detail RLE Deficits / Details: contraction of all joints, loosen somewhat with repetitive PROM LLE Deficits / Details: contraction of all joints, loosen somewhat with repetitive PROM       Communication   Communication: Expressive difficulties (soft mumbling speech)  Cognition Arousal/Alertness: Awake/alert Behavior During Therapy: WFL for tasks assessed/performed Overall Cognitive Status: Within Functional Limits for tasks assessed                                        General Comments General comments (skin integrity, edema, etc.): VSS on RA    Exercises General Exercises - Upper Extremity Shoulder Flexion: PROM;Both;10 reps Shoulder ABduction: PROM;Both;10 reps Elbow Flexion: PROM;Both;10 reps Elbow Extension: PROM;Both;10 reps  Wrist Flexion: PROM;Both;10 reps Wrist Extension: PROM;Both;10 reps Composite Extension: PROM;Both;10 reps General Exercises - Lower Extremity Ankle Circles/Pumps: PROM;Both;10 reps Short Arc Quad: PROM;Both;10 reps Hip ABduction/ADduction: PROM;Both;15 reps Hip Flexion/Marching: PROM;Both;10 reps   Assessment/Plan    PT Assessment Patient needs continued PT services  PT Problem List Decreased range of motion;Decreased strength;Decreased mobility;Decreased skin integrity       PT  Treatment Interventions DME instruction;Functional mobility training;Therapeutic activities;Therapeutic exercise;Balance training;Patient/family education    PT Goals (Current goals can be found in the Care Plan section)  Acute Rehab PT Goals Patient Stated Goal: have someone put his braces on daily PT Goal Formulation: With patient Time For Goal Achievement: 01/29/20 Potential to Achieve Goals: Fair    Frequency Min 2X/week    AM-PAC PT "6 Clicks" Mobility  Outcome Measure Help needed turning from your back to your side while in a flat bed without using bedrails?: Total Help needed moving from lying on your back to sitting on the side of a flat bed without using bedrails?: Total Help needed moving to and from a bed to a chair (including a wheelchair)?: Total Help needed standing up from a chair using your arms (e.g., wheelchair or bedside chair)?: Total Help needed to walk in hospital room?: Total Help needed climbing 3-5 steps with a railing? : Total 6 Click Score: 6    End of Session   Activity Tolerance: Patient tolerated treatment well Patient left: in bed;with call bell/phone within reach;with chair alarm set;Other (comment) (placed bed in chair position and offloaded L hip) Nurse Communication: Mobility status PT Visit Diagnosis: Muscle weakness (generalized) (M62.81);Difficulty in walking, not elsewhere classified (R26.2);Other symptoms and signs involving the nervous system (R29.898)    Time: 4818-5631 PT Time Calculation (min) (ACUTE ONLY): 28 min   Charges:   PT Evaluation $PT Eval Moderate Complexity: 1 Mod PT Treatments $Therapeutic Exercise: 8-22 mins        Zethan Alfieri B. Beverely Risen PT, DPT Acute Rehabilitation Services Pager [phone] Office [phone]   Elon Alas Fleet 01/15/2020, 11:26 AM

## 2020-01-15 NOTE — Progress Notes (Addendum)
Progress Note    Travis Cantrell  ZOX:[id number] DOB: Jun 19, 1960  DOA: 01/12/2020 PCP: Excell Seltzer, MD    Brief Narrative:    Medical records reviewed and are as summarized below:  Travis Cantrell is an 59 y.o. male who is a nursing home resident, quadriplegic with multiple sclerosis, decub ulcer and indwelling chronic Foley causing recurrent UTI admitted with 2 days of nausea, vomiting septic shock, presumed UTI.  Lactate elevated to 7 with AKI, anion gap acidosis and leukocytosis.  Foley appears clogged and was replaced in the ED.  Patient briefly required vasopressor support.  Tx to [facility] on 12/21.  Assessment/Plan:   Active Problems:   Septic shock (HCC)   Pressure injury of skin    Septic shock due bacteremia and UTI: Polymicrobial Bacteremia ( Staph species, E faecalis, Pseudomonas aeruginosa, Proteus) Continue IV fluid resuscitation IV abx ID consult: will need TEE -cards consult placed Indwelling Foley catheter was changed in the ED 01/15/20 Per ID, will hold off on TEE as the patient continues to improve clinically. Had repeat blood cultures on 12/21, will follow up.   AKI secondary to sepsis, blocked Foley -foley replaced Renal ultrasound ruled out obstruction -CR trending down 01/15/20 Continue Foley catheter. Will need Urology evaluation, outpatient v inpatient due to multiple kidney stones and UTI with sepsis.   Debilitating multiple sclerosis Patient is quadriparetic with minimal movement -from SNF Has decubitus ulcer, unstageable  Acute respiratory failure -wean to off as tolerated 01/15/20 Resolved, undetermined cause. Per ID, no suspicion for pna.   Pressure Injury 01/12/20 Buttocks Mid Stage 2 -  Partial thickness loss of dermis presenting as a shallow open injury with a red, pink wound bed without slough. (Active)  01/12/20 1700  Location: Buttocks  Location Orientation: Mid  Staging: Stage 2 -  Partial thickness loss of dermis presenting as a  shallow open injury with a red, pink wound bed without slough.  Wound Description (Comments):   Present on Admission: Yes     Pressure Injury 01/12/20 Back Right;Upper;Bilateral Stage 2 -  Partial thickness loss of dermis presenting as a shallow open injury with a red, pink wound bed without slough. covered with yeast (Active)  01/12/20 1700  Location: Back  Location Orientation: Right;Upper;Bilateral  Staging: Stage 2 -  Partial thickness loss of dermis presenting as a shallow open injury with a red, pink wound bed without slough.  Wound Description (Comments): covered with yeast  Present on Admission: Yes       Family Communication/Anticipated D/C date and plan/Code Status   DVT prophylaxis: heparin SQ Code Status: Full Code.   Disposition Plan: Status is: Inpatient  Remains inpatient appropriate because:Inpatient level of care appropriate due to severity of illness   Dispo: The patient is from: SNF              Anticipated d/c is to: SNF              Anticipated d/c date is: > 3 days              Patient currently is not medically stable to d/c.         Medical Consultants:    PCCM  ID     Subjective:   Patient reports no complaints. Good UOP with no signs of retention per nursing report.   Objective:    Vitals:   01/15/20 1000 01/15/20 1100 01/15/20 1115 01/15/20 1200  BP:    138/85  Pulse:  69 60  69  Resp: (!) 25 (!) 22  19  Temp:   98.3 F (36.8 C)   TempSrc:   Oral   SpO2: 95% 98%  95%  Weight:      Height:        Intake/Output Summary (Last 24 hours) at 01/15/2020 1457 Last data filed at 01/15/2020 1400 Gross per 24 hour  Intake 411.85 ml  Output 2745 ml  Net -2333.15 ml   Filed Weights   01/13/20 0400 01/14/20 0500 01/15/20 0500  Weight: 74.7 kg 77.5 kg 75.6 kg    Exam: Gen: Resting in bed, in NAD Cardiac: RRR, S1 and S2 present.  Pulm: No cough, normal effort GI: Soft, NT Psych: Alert, pleasant   Data Reviewed:   I  have personally reviewed following labs and imaging studies:  Labs: Labs show the following:   Basic Metabolic Panel: Recent Labs  Lab 01/12/20 1256 01/13/20 0518 01/14/20 0529 01/15/20 0124  NA 141 144 142 142  K 4.2 4.0 3.7 3.5  CL 107 108 110 107  CO2 14* 24 23 24   GLUCOSE 246* 124* 88 97  BUN 55* 44* 30* 25*  CREATININE 4.71* 2.49* 1.50* 1.20  CALCIUM 8.9 8.3* 8.0* 8.2*  MG  --  2.2  --   --   PHOS  --  2.9  --   --    GFR Estimated Creatinine Clearance: 68.4 mL/min (by C-G formula based on SCr of 1.2 mg/dL). Liver Function Tests: Recent Labs  Lab 01/12/20 1256  AST 24  ALT 18  ALKPHOS 152*  BILITOT 0.8  PROT 6.7  ALBUMIN 2.6*   No results for input(s): LIPASE, AMYLASE in the last 168 hours. No results for input(s): AMMONIA in the last 168 hours. Coagulation profile Recent Labs  Lab 01/12/20 1256  INR 1.6*    CBC: Recent Labs  Lab 01/12/20 1256 01/12/20 1521 01/13/20 0518 01/14/20 0529 01/15/20 0516  WBC 21.1* 27.4* 33.2* 10.7* 8.4  NEUTROABS 19.3*  --   --   --   --   HGB 15.1 11.7* 11.5* 11.0* 11.6*  HCT 48.4 38.3* 36.5* 36.1* 35.7*  MCV 89.6 93.0 91.0 90.5 89.7  PLT 340 256 287 204 202   Cardiac Enzymes: Recent Labs  Lab 01/15/20 0516  CKTOTAL 17*   BNP (last 3 results) No results for input(s): PROBNP in the last 8760 hours. CBG: Recent Labs  Lab 01/14/20 2029 01/14/20 2327 01/15/20 0353 01/15/20 0737 01/15/20 1114  GLUCAP 106* 84 89 86 97   D-Dimer: No results for input(s): DDIMER in the last 72 hours. Hgb A1c: Recent Labs    01/12/20 1521  HGBA1C 5.3   Lipid Profile: No results for input(s): CHOL, HDL, LDLCALC, TRIG, CHOLHDL, LDLDIRECT in the last 72 hours. Thyroid function studies: No results for input(s): TSH, T4TOTAL, T3FREE, THYROIDAB in the last 72 hours.  Invalid input(s): FREET3 Anemia work up: No results for input(s): VITAMINB12, FOLATE, FERRITIN, TIBC, IRON, RETICCTPCT in the last 72 hours. Sepsis  Labs: Recent Labs  Lab 01/12/20 1256 01/12/20 1521 01/12/20 1757 01/13/20 0200 01/13/20 0518 01/14/20 0529 01/15/20 0516  WBC 21.1* 27.4*  --   --  33.2* 10.7* 8.4  LATICACIDVEN 7.1* 5.8* 3.5* 1.9  --   --   --     Microbiology Recent Results (from the past 240 hour(s))  Blood Culture (routine x 2)     Status: Abnormal (Preliminary result)   Collection Time: 01/12/20 12:56 PM   Specimen:  BLOOD  Result Value Ref Range Status   Specimen Description BLOOD SITE NOT SPECIFIED  Final   Special Requests   Final    BOTTLES DRAWN AEROBIC AND ANAEROBIC Blood Culture results may not be optimal due to an inadequate volume of blood received in culture bottles   Culture  Setup Time   Final    GRAM NEGATIVE RODS GRAM POSITIVE COCCI IN CHAINS IN BOTH AEROBIC AND ANAEROBIC BOTTLES GRAM POSITIVE COCCI IN CLUSTERS AEROBIC BOTTLE ONLY CRITICAL RESULT CALLED TO, READ BACK BY AND VERIFIED WITH: J. LEDFORD,PHARMD [id number] 01/13/2020 T. TYSOR    Culture (A)  Final    PROTEUS MIRABILIS GRAM POSITIVE COCCI CULTURE REINCUBATED FOR BETTER GROWTH Performed at [facility], [address] [place], [place] [address]    Report Status PENDING  Incomplete  Blood Culture (routine x 2)     Status: Abnormal (Preliminary result)   Collection Time: 01/12/20 12:56 PM   Specimen: BLOOD LEFT HAND  Result Value Ref Range Status   Specimen Description BLOOD LEFT HAND  Final   Special Requests   Final    BOTTLES DRAWN AEROBIC AND ANAEROBIC Blood Culture adequate volume   Culture  Setup Time   Final    GRAM NEGATIVE RODS IN BOTH AEROBIC AND ANAEROBIC BOTTLES CRITICAL VALUE NOTED.  VALUE IS CONSISTENT WITH PREVIOUSLY REPORTED AND CALLED VALUE.    Culture (A)  Final    PSEUDOMONAS AERUGINOSA PROTEUS MIRABILIS SUSCEPTIBILITIES TO FOLLOW Performed at [facility], [address], [place], [place] [address]    Report Status PENDING  Incomplete  Urine culture     Status: Abnormal (Preliminary result)    Collection Time: 01/12/20 12:56 PM   Specimen: In/Out Cath Urine  Result Value Ref Range Status   Specimen Description IN/OUT CATH URINE  Final   Special Requests NONE  Final   Culture (A)  Final    >=100,000 COLONIES/mL PROTEUS MIRABILIS >=100,000 COLONIES/mL PSEUDOMONAS AERUGINOSA SUSCEPTIBILITIES TO FOLLOW Performed at [facility], [address], [place], [place] [address]    Report Status PENDING  Incomplete   Organism ID, Bacteria PROTEUS MIRABILIS (A)  Final      Susceptibility   Proteus mirabilis - MIC*    AMPICILLIN <=2 SENSITIVE Sensitive     CEFAZOLIN <=4 SENSITIVE Sensitive     CEFEPIME <=0.12 SENSITIVE Sensitive     CEFTRIAXONE <=0.25 SENSITIVE Sensitive     CIPROFLOXACIN <=0.25 SENSITIVE Sensitive     GENTAMICIN <=1 SENSITIVE Sensitive     IMIPENEM 2 SENSITIVE Sensitive     NITROFURANTOIN RESISTANT Resistant     TRIMETH/SULFA <=20 SENSITIVE Sensitive     AMPICILLIN/SULBACTAM <=2 SENSITIVE Sensitive     PIP/TAZO <=4 SENSITIVE Sensitive     * >=100,000 COLONIES/mL PROTEUS MIRABILIS  Resp Panel by RT-PCR (Flu A&B, Covid) Nasopharyngeal Swab     Status: None   Collection Time: 01/12/20 12:56 PM   Specimen: Nasopharyngeal Swab; Nasopharyngeal(NP) swabs in vial transport medium  Result Value Ref Range Status   SARS Coronavirus 2 by RT PCR NEGATIVE NEGATIVE Final    Comment: (NOTE) SARS-CoV-2 target nucleic acids are NOT DETECTED.  The SARS-CoV-2 RNA is generally detectable in upper respiratory specimens during the acute phase of infection. The lowest concentration of SARS-CoV-2 viral copies this assay can detect is 138 copies/mL. A negative result does not preclude SARS-Cov-2 infection and should not be used as the sole basis for treatment or other patient management decisions. A negative result  may occur with  improper specimen collection/handling, submission of specimen other than nasopharyngeal swab, presence of viral mutation(s) within the areas  targeted by this assay, and inadequate number of viral copies(<138 copies/mL). A negative result must be combined with clinical observations, patient history, and epidemiological information. The expected result is Negative.  Fact Sheet for Patients:  BloggerCourse.com  Fact Sheet for Healthcare Providers:  SeriousBroker.it  This test is no t yet approved or cleared by the [place] FDA and  has been authorized for detection and/or diagnosis of SARS-CoV-2 by FDA under an Emergency Use Authorization (EUA). This EUA will remain  in effect (meaning this test can be used) for the duration of the COVID-19 declaration under Section 564(b)(1) of the Act, 21 U.S.C.section 360bbb-3(b)(1), unless the authorization is terminated  or revoked sooner.       Influenza A by PCR NEGATIVE NEGATIVE Final   Influenza B by PCR NEGATIVE NEGATIVE Final    Comment: (NOTE) The Xpert Xpress SARS-CoV-2/FLU/RSV plus assay is intended as an aid in the diagnosis of influenza from Nasopharyngeal swab specimens and should not be used as a sole basis for treatment. Nasal washings and aspirates are unacceptable for Xpert Xpress SARS-CoV-2/FLU/RSV testing.  Fact Sheet for Patients: BloggerCourse.com  Fact Sheet for Healthcare Providers: SeriousBroker.it  This test is not yet approved or cleared by the [place] FDA and has been authorized for detection and/or diagnosis of SARS-CoV-2 by FDA under an Emergency Use Authorization (EUA). This EUA will remain in effect (meaning this test can be used) for the duration of the COVID-19 declaration under Section 564(b)(1) of the Act, 21 U.S.C. section 360bbb-3(b)(1), unless the authorization is terminated or revoked.  Performed at [facility], [address] [place], [place] [address]   Blood Culture ID Panel (Reflexed)     Status: Abnormal    Collection Time: 01/12/20 12:56 PM  Result Value Ref Range Status   Enterococcus faecalis DETECTED (A) NOT DETECTED Final    Comment: CRITICAL RESULT CALLED TO, READ BACK BY AND VERIFIED WITH: J. LEDFORD,PHARMD [id number] 01/13/2020 T. TYSOR    Enterococcus Faecium NOT DETECTED NOT DETECTED Final   Listeria monocytogenes NOT DETECTED NOT DETECTED Final   Staphylococcus species DETECTED (A) NOT DETECTED Final    Comment: CRITICAL RESULT CALLED TO, READ BACK BY AND VERIFIED WITH: J. LEDFORD,PHARMD [id number] 01/13/2020 T. TYSOR    Staphylococcus aureus (BCID) NOT DETECTED NOT DETECTED Final   Staphylococcus epidermidis NOT DETECTED NOT DETECTED Final   Staphylococcus lugdunensis NOT DETECTED NOT DETECTED Final   Streptococcus species NOT DETECTED NOT DETECTED Final   Streptococcus agalactiae NOT DETECTED NOT DETECTED Final   Streptococcus pneumoniae NOT DETECTED NOT DETECTED Final   Streptococcus pyogenes NOT DETECTED NOT DETECTED Final   A.calcoaceticus-baumannii NOT DETECTED NOT DETECTED Final   Bacteroides fragilis NOT DETECTED NOT DETECTED Final   Enterobacterales DETECTED (A) NOT DETECTED Final    Comment: Enterobacterales represent a large order of gram negative bacteria, not a single organism. CRITICAL RESULT CALLED TO, READ BACK BY AND VERIFIED WITH: J. LEDFORD,PHARMD 1191 01/13/2020 T. TYSOR    Enterobacter cloacae complex NOT DETECTED NOT DETECTED Final   Escherichia coli NOT DETECTED NOT DETECTED Final   Klebsiella aerogenes NOT DETECTED NOT DETECTED Final   Klebsiella oxytoca NOT DETECTED NOT DETECTED Final   Klebsiella pneumoniae NOT DETECTED NOT DETECTED Final   Proteus species DETECTED (A) NOT DETECTED Final    Comment: CRITICAL RESULT CALLED TO, READ BACK BY AND VERIFIED WITH:  J. LEDFORD,PHARMD [id number] 01/13/2020 T. TYSOR    Salmonella species NOT DETECTED NOT DETECTED Final   Serratia marcescens NOT DETECTED NOT DETECTED Final   Haemophilus influenzae NOT DETECTED NOT DETECTED  Final   Neisseria meningitidis NOT DETECTED NOT DETECTED Final   Pseudomonas aeruginosa DETECTED (A) NOT DETECTED Final    Comment: CRITICAL RESULT CALLED TO, READ BACK BY AND VERIFIED WITH: J. LEDFORD,PHARMD [id number] 01/13/2020 T. TYSOR    Stenotrophomonas maltophilia NOT DETECTED NOT DETECTED Final   Candida albicans NOT DETECTED NOT DETECTED Final   Candida auris NOT DETECTED NOT DETECTED Final   Candida glabrata NOT DETECTED NOT DETECTED Final   Candida krusei NOT DETECTED NOT DETECTED Final   Candida parapsilosis NOT DETECTED NOT DETECTED Final   Candida tropicalis NOT DETECTED NOT DETECTED Final   Cryptococcus neoformans/gattii NOT DETECTED NOT DETECTED Final   CTX-M ESBL NOT DETECTED NOT DETECTED Final   Carbapenem resistance IMP NOT DETECTED NOT DETECTED Final   Carbapenem resistance KPC NOT DETECTED NOT DETECTED Final   Carbapenem resistance NDM NOT DETECTED NOT DETECTED Final   Carbapenem resist OXA 48 LIKE NOT DETECTED NOT DETECTED Final   Vancomycin resistance NOT DETECTED NOT DETECTED Final   Carbapenem resistance VIM NOT DETECTED NOT DETECTED Final    Comment: Performed at [facility], [address], [place], [place] [address]  MRSA PCR Screening     Status: None   Collection Time: 01/12/20  9:10 PM   Specimen: Nasal Mucosa; Nasopharyngeal  Result Value Ref Range Status   MRSA by PCR NEGATIVE NEGATIVE Final    Comment:        The GeneXpert MRSA Assay (FDA approved for NASAL specimens only), is one component of a comprehensive MRSA colonization surveillance program. It is not intended to diagnose MRSA infection nor to guide or monitor treatment for MRSA infections. Performed at [facility], [address] [place], [place] [address]   Culture, blood (routine x 2)     Status: None (Preliminary result)   Collection Time: 01/14/20 10:16 AM   Specimen: BLOOD LEFT HAND  Result Value Ref Range Status   Specimen Description BLOOD LEFT HAND  Final   Special  Requests   Final    BOTTLES DRAWN AEROBIC AND ANAEROBIC Blood Culture adequate volume   Culture   Final    NO GROWTH 1 DAY Performed at [facility], [address] [place], [place] [address]    Report Status PENDING  Incomplete  Culture, blood (routine x 2)     Status: None (Preliminary result)   Collection Time: 01/14/20 10:21 AM   Specimen: BLOOD LEFT FOREARM  Result Value Ref Range Status   Specimen Description BLOOD LEFT FOREARM  Final   Special Requests   Final    BOTTLES DRAWN AEROBIC AND ANAEROBIC Blood Culture adequate volume   Culture   Final    NO GROWTH 1 DAY Performed at [facility], [address], [place], [place] [address]    Report Status PENDING  Incomplete    Procedures and diagnostic studies:  ECHOCARDIOGRAM COMPLETE  Result Date: 01/13/2020    ECHOCARDIOGRAM REPORT   Patient Name:   Travis Cantrell Date of Exam: 01/13/2020 Medical Rec #:  [id number]     Height:       70.0 in Accession #:    [id number]    Weight:       164.7 lb Date of Birth:  [birth year]  BSA:          1.922 m Patient Age:    59 years      BP:           109/74 mmHg Patient Gender: M             HR:           65 bpm. Exam Location:  Inpatient Procedure: 2D Echo, Cardiac Doppler and Color Doppler Indications:    Sepsis  History:        Patient has prior history of Echocardiogram examinations, most                 recent 02/12/2015. Risk Factors:Hypertension. GERD. Multiple                 sclerosis.  Sonographer:    Ross Ludwig RDCS (AE) Referring Phys: [id number] [facility]  Sonographer Comments: Suboptimal subcostal window. IMPRESSIONS  1. Left ventricular ejection fraction, by estimation, is 65 to 70%. The left ventricle has normal function. The left ventricle has no regional wall motion abnormalities. There is moderate left ventricular hypertrophy. Left ventricular diastolic parameters are consistent with Grade I diastolic dysfunction (impaired relaxation).  2. Prominent moderator band.  Right ventricular systolic function is normal. The right ventricular size is normal.  3. The mitral valve is abnormal. Mild mitral valve regurgitation.  4. Possible mobile vegetation on the septal leaflet. The tricuspid valve is abnormal.  5. The aortic valve is tricuspid. Aortic valve regurgitation is not visualized. Comparison(s): Changes from prior study are noted. 02/12/15: LVEF >75%. Conclusion(s)/Recommendation(s): Findings concerning for possible tricuspid valve vegetation, would recommend a Transesophageal Echocardiogram for clarification. FINDINGS  Left Ventricle: Left ventricular ejection fraction, by estimation, is 65 to 70%. The left ventricle has normal function. The left ventricle has no regional wall motion abnormalities. The left ventricular internal cavity size was normal in size. There is  moderate left ventricular hypertrophy. Left ventricular diastolic parameters are consistent with Grade I diastolic dysfunction (impaired relaxation). Indeterminate filling pressures. Right Ventricle: Prominent moderator band. The right ventricular size is normal. No increase in right ventricular wall thickness. Right ventricular systolic function is normal. Left Atrium: Left atrial size was normal in size. Right Atrium: Right atrial size was normal in size. Pericardium: There is no evidence of pericardial effusion. Mitral Valve: The mitral valve is abnormal. There is mild thickening of the mitral valve leaflet(s). Mild mitral valve regurgitation. Tricuspid Valve: Possible mobile vegetation on the septal leaflet vs. possible partially flail cord. The tricuspid valve is abnormal. Tricuspid valve regurgitation is trivial. Aortic Valve: The aortic valve is tricuspid. Aortic valve regurgitation is not visualized. Aortic valve mean gradient measures 4.0 mmHg. Aortic valve peak gradient measures 6.2 mmHg. Aortic valve area, by VTI measures 3.22 cm. Pulmonic Valve: The pulmonic valve was normal in structure. Pulmonic  valve regurgitation is not visualized. Aorta: The aortic root and ascending aorta are structurally normal, with no evidence of dilitation. IAS/Shunts: No atrial level shunt detected by color flow Doppler.  LEFT VENTRICLE PLAX 2D LVIDd:         4.40 cm  Diastology LVIDs:         3.10 cm  LV e' medial:    7.94 cm/s LV PW:         1.20 cm  LV E/e' medial:  8.4 LV IVS:        1.40 cm  LV e' lateral:   11.10 cm/s LVOT diam:     2.40  cm  LV E/e' lateral: 6.0 LV SV:         68 LV SV Index:   36 LVOT Area:     4.52 cm  RIGHT VENTRICLE RV S prime:     16.50 cm/s TAPSE (M-mode): 1.8 cm LEFT ATRIUM             Index       RIGHT ATRIUM           Index LA diam:        2.50 cm 1.30 cm/m  RA Area:     10.10 cm LA Vol (A2C):   40.5 ml 21.07 ml/m RA Volume:   18.60 ml  9.68 ml/m LA Vol (A4C):   36.4 ml 18.94 ml/m LA Biplane Vol: 39.5 ml 20.55 ml/m  AORTIC VALVE AV Area (Vmax):    3.21 cm AV Area (Vmean):   2.92 cm AV Area (VTI):     3.22 cm AV Vmax:           124.00 cm/s AV Vmean:          92.100 cm/s AV VTI:            0.212 m AV Peak Grad:      6.2 mmHg AV Mean Grad:      4.0 mmHg LVOT Vmax:         88.10 cm/s LVOT Vmean:        59.400 cm/s LVOT VTI:          0.151 m LVOT/AV VTI ratio: 0.71  AORTA Ao Root diam: 3.50 cm Ao Asc diam:  3.10 cm MITRAL VALVE               TRICUSPID VALVE MV Area (PHT): 2.42 cm    TR Peak grad:   16.0 mmHg MV Decel Time: 313 msec    TR Vmax:        200.00 cm/s MV E velocity: 66.40 cm/s MV A velocity: 51.80 cm/s  SHUNTS MV E/A ratio:  1.28        Systemic VTI:  0.15 m                            Systemic Diam: 2.40 cm Zoila Shutter MD Electronically signed by Zoila Shutter MD Signature Date/Time: 01/13/2020/5:16:57 PM    Final     Medications:   . Chlorhexidine Gluconate Cloth  6 each Topical Daily  . heparin  5,000 Units Subcutaneous Q8H  . insulin aspart  0-9 Units Subcutaneous Q4H  . polyvinyl alcohol  2 drop Both Eyes Q4H   Continuous Infusions: . ceFEPime (MAXIPIME) IV 2 g  (01/15/20 1405)  . DAPTOmycin (CUBICIN)  IV Stopped (01/14/20 1238)  . norepinephrine (LEVOPHED) Adult infusion Stopped (01/13/20 0906)     LOS: 3 days   Ky Barban  [facility]   How to contact the [facility] Attending or Consulting provider 7A - 7P or covering provider during after hours 7P -7A, for this patient?  1. Check the care team in [facility] and look for a) attending/consulting [facility] provider listed and b) the [facility] team listed 2. Log into [email or web address] and use [facility] universal password to access. If you do not have the password, please contact the hospital operator. 3. Locate the [facility] provider you are looking for under [facility] and page to a number that you can be directly reached. 4. If you still have difficulty reaching the provider,  please page the [facility] (Director on Call) for the Hospitalists listed on amion for assistance.  01/15/2020, 2:57 PM

## 2020-01-15 NOTE — Progress Notes (Signed)
[facility] Infectious Diseases Follow Up Note  Patient Identification: Patient Name: Travis Cantrell MRN: [id number] Admit Date: 01/12/2020 12:35 PM Age: 59 y.o.Today's Date: 01/15/2020   Reason for Visit: Polymicrobial bacteremia   Active Problems:   Septic shock (HCC)   Pressure injury of skin  Antibiotics:Daptomycin Day 2 and cefepime day 3                    Total days of antibiotics day3    Interval Events: TTE re-read by Dr Delton See with no concerns for vegetation and only trivial TR. Afebrile, leukocytosis has resolved, transferred to medical floor from ICU  Assessment 58 Y O Male from NH with PMH of MS, GERD, HTN, quadriplegic, bedbound with decubitus ulcer and indwelling foleys catheteradmitted with  #Polymicrobial Bacteremia ( Staph species, E faecalis, Pseudomonas aeruginosa, Proteus): Most likely source is Urinary given h/o burning urination, suprapubic tenderness on exam and h/o clogged Foleys. -UA turbid, , large leukocytes, positive nitrite, WBC >50 -Urine cx grew >100K Proteus mirabilis   # Stage 1 Sacral Decubitus Ulcers - Does not appear to be infected currently   Recommendations Continue cefepime and Daptomycin. CPK 17  Anticipate de-escalation of antibiotics identification of GPC in clusters in blood cultures  F/u blood cultures 12/21 Will hold off on TEE given no concerns for vegetation in addended TTE reports and most likey source being urinary  ? Urology evaluation given multiple kidney stones Monitor CBC, CMP and CPK while on IV abx  Following  Rest of the management as per the primary team. Thank you for the consult. Please page with pertinent questions or concerns.  ______________________________________________________________________ Subjective patient seen and examined at the bedside. Patient still in MICU and waiting for availability of bed in the medical floor. He is doing well  and has no complaints.   Vitals BP 115/81   Pulse 81   Temp 98.6 F (37 C) (Oral)   Resp (!) 22   Ht 5\' 10"  (1.778 m)   Wt 75.6 kg   SpO2 94%   BMI 23.91 kg/m    Physical Exam Constitutional:  Comments:Not in acute disress, appears comfortable  Cardiovascular:  Rate and Rhythm: Normal rateand regular rhythm.  Heart sounds: No murmurheard.   Pulmonary:  Effort: Pulmonary effort is normal.  Comments:   Abdominal:  Palpations: Abdomen isdistended, BS+ Tenderness:no suprapubic tenderness   Musculoskeletal:   Skin: Comments:Nop obvious rashes, lesions  Neurological:  General:quadriplegic  Psychiatric:  Mood and Affect: Moodnormal.    Pertinent Microbiology Results for orders placed or performed during the hospital encounter of 01/12/20  Blood Culture (routine x 2)     Status: None (Preliminary result)   Collection Time: 01/12/20 12:56 PM   Specimen: BLOOD  Result Value Ref Range Status   Specimen Description BLOOD SITE NOT SPECIFIED  Final   Special Requests   Final    BOTTLES DRAWN AEROBIC AND ANAEROBIC Blood Culture results may not be optimal due to an inadequate volume of blood received in culture bottles   Culture  Setup Time   Final    GRAM NEGATIVE RODS GRAM POSITIVE COCCI IN CHAINS IN BOTH AEROBIC AND ANAEROBIC BOTTLES GRAM POSITIVE COCCI IN CLUSTERS AEROBIC BOTTLE ONLY CRITICAL RESULT CALLED TO, READ BACK BY AND VERIFIED WITH: J. LEDFORD,PHARMD 01/14/20 01/13/2020 T. TYSOR    Culture   Final    01/15/2020 NEGATIVE RODS GRAM POSITIVE COCCI CULTURE REINCUBATED FOR BETTER GROWTH Performed at [facility], [address]  [place], [place] [address]    Report Status PENDING  Incomplete  Blood Culture (routine x 2)     Status: None (Preliminary result)   Collection Time: 01/12/20 12:56 PM   Specimen: BLOOD LEFT HAND  Result Value Ref Range Status   Specimen Description BLOOD LEFT HAND  Final   Special  Requests   Final    BOTTLES DRAWN AEROBIC AND ANAEROBIC Blood Culture adequate volume   Culture  Setup Time   Final    GRAM NEGATIVE RODS IN BOTH AEROBIC AND ANAEROBIC BOTTLES CRITICAL VALUE NOTED.  VALUE IS CONSISTENT WITH PREVIOUSLY REPORTED AND CALLED VALUE.    Culture   Final    GRAM NEGATIVE RODS CULTURE REINCUBATED FOR BETTER GROWTH Performed at [facility], [address] [place], [place] [address]    Report Status PENDING  Incomplete  Urine culture     Status: Abnormal (Preliminary result)   Collection Time: 01/12/20 12:56 PM   Specimen: In/Out Cath Urine  Result Value Ref Range Status   Specimen Description IN/OUT CATH URINE  Final   Special Requests NONE  Final   Culture (A)  Final    >=100,000 COLONIES/mL PROTEUS MIRABILIS CULTURE REINCUBATED FOR BETTER GROWTH    Report Status PENDING  Incomplete   Organism ID, Bacteria PROTEUS MIRABILIS (A)  Final      Susceptibility   Proteus mirabilis - MIC*    AMPICILLIN <=2 SENSITIVE Sensitive     CEFAZOLIN <=4 SENSITIVE Sensitive     CEFEPIME <=0.12 SENSITIVE Sensitive     CEFTRIAXONE <=0.25 SENSITIVE Sensitive     CIPROFLOXACIN <=0.25 SENSITIVE Sensitive     GENTAMICIN <=1 SENSITIVE Sensitive     IMIPENEM 2 SENSITIVE Sensitive     NITROFURANTOIN RESISTANT Resistant     TRIMETH/SULFA <=20 SENSITIVE Sensitive     AMPICILLIN/SULBACTAM <=2 SENSITIVE Sensitive     PIP/TAZO Value in next row Sensitive      <=4 SENSITIVEPerformed at [facility], [address], [place], [place] [address]    * >=100,000 COLONIES/mL PROTEUS MIRABILIS  Resp Panel by RT-PCR (Flu A&B, Covid) Nasopharyngeal Swab     Status: None   Collection Time: 01/12/20 12:56 PM   Specimen: Nasopharyngeal Swab; Nasopharyngeal(NP) swabs in vial transport medium  Result Value Ref Range Status   SARS Coronavirus 2 by RT PCR NEGATIVE NEGATIVE Final    Comment: (NOTE) SARS-CoV-2 target nucleic acids are NOT DETECTED.  The SARS-CoV-2 RNA is generally  detectable in upper respiratory specimens during the acute phase of infection. The lowest concentration of SARS-CoV-2 viral copies this assay can detect is 138 copies/mL. A negative result does not preclude SARS-Cov-2 infection and should not be used as the sole basis for treatment or other patient management decisions. A negative result may occur with  improper specimen collection/handling, submission of specimen other than nasopharyngeal swab, presence of viral mutation(s) within the areas targeted by this assay, and inadequate number of viral copies(<138 copies/mL). A negative result must be combined with clinical observations, patient history, and epidemiological information. The expected result is Negative.  Fact Sheet for Patients:  BloggerCourse.com  Fact Sheet for Healthcare Providers:  SeriousBroker.it  This test is no t yet approved or cleared by the [place] FDA and  has been authorized for detection and/or diagnosis of SARS-CoV-2 by FDA under an Emergency Use Authorization (EUA). This EUA will remain  in effect (meaning this test can be used) for the duration of the COVID-19 declaration under Section 564(b)(1)  of the Act, 21 U.S.C.section 360bbb-3(b)(1), unless the authorization is terminated  or revoked sooner.       Influenza A by PCR NEGATIVE NEGATIVE Final   Influenza B by PCR NEGATIVE NEGATIVE Final    Comment: (NOTE) The Xpert Xpress SARS-CoV-2/FLU/RSV plus assay is intended as an aid in the diagnosis of influenza from Nasopharyngeal swab specimens and should not be used as a sole basis for treatment. Nasal washings and aspirates are unacceptable for Xpert Xpress SARS-CoV-2/FLU/RSV testing.  Fact Sheet for Patients: BloggerCourse.com  Fact Sheet for Healthcare Providers: SeriousBroker.it  This test is not yet approved or cleared by the [place] FDA  and has been authorized for detection and/or diagnosis of SARS-CoV-2 by FDA under an Emergency Use Authorization (EUA). This EUA will remain in effect (meaning this test can be used) for the duration of the COVID-19 declaration under Section 564(b)(1) of the Act, 21 U.S.C. section 360bbb-3(b)(1), unless the authorization is terminated or revoked.  Performed at [facility], [address] [place], [place] [address]   Blood Culture ID Panel (Reflexed)     Status: Abnormal   Collection Time: 01/12/20 12:56 PM  Result Value Ref Range Status   Enterococcus faecalis DETECTED (A) NOT DETECTED Final    Comment: CRITICAL RESULT CALLED TO, READ BACK BY AND VERIFIED WITH: J. LEDFORD,PHARMD [id number] 01/13/2020 T. TYSOR    Enterococcus Faecium NOT DETECTED NOT DETECTED Final   Listeria monocytogenes NOT DETECTED NOT DETECTED Final   Staphylococcus species DETECTED (A) NOT DETECTED Final    Comment: CRITICAL RESULT CALLED TO, READ BACK BY AND VERIFIED WITH: J. LEDFORD,PHARMD 0630 01/13/2020 T. TYSOR    Staphylococcus aureus (BCID) NOT DETECTED NOT DETECTED Final   Staphylococcus epidermidis NOT DETECTED NOT DETECTED Final   Staphylococcus lugdunensis NOT DETECTED NOT DETECTED Final   Streptococcus species NOT DETECTED NOT DETECTED Final   Streptococcus agalactiae NOT DETECTED NOT DETECTED Final   Streptococcus pneumoniae NOT DETECTED NOT DETECTED Final   Streptococcus pyogenes NOT DETECTED NOT DETECTED Final   A.calcoaceticus-baumannii NOT DETECTED NOT DETECTED Final   Bacteroides fragilis NOT DETECTED NOT DETECTED Final   Enterobacterales DETECTED (A) NOT DETECTED Final    Comment: Enterobacterales represent a large order of gram negative bacteria, not a single organism. CRITICAL RESULT CALLED TO, READ BACK BY AND VERIFIED WITH: J. LEDFORD,PHARMD 1601 01/13/2020 T. TYSOR    Enterobacter cloacae complex NOT DETECTED NOT DETECTED Final   Escherichia coli NOT DETECTED NOT DETECTED Final    Klebsiella aerogenes NOT DETECTED NOT DETECTED Final   Klebsiella oxytoca NOT DETECTED NOT DETECTED Final   Klebsiella pneumoniae NOT DETECTED NOT DETECTED Final   Proteus species DETECTED (A) NOT DETECTED Final    Comment: CRITICAL RESULT CALLED TO, READ BACK BY AND VERIFIED WITH: J. LEDFORD,PHARMD 0932 01/13/2020 T. TYSOR    Salmonella species NOT DETECTED NOT DETECTED Final   Serratia marcescens NOT DETECTED NOT DETECTED Final   Haemophilus influenzae NOT DETECTED NOT DETECTED Final   Neisseria meningitidis NOT DETECTED NOT DETECTED Final   Pseudomonas aeruginosa DETECTED (A) NOT DETECTED Final    Comment: CRITICAL RESULT CALLED TO, READ BACK BY AND VERIFIED WITH: J. LEDFORD,PHARMD [id number] 01/13/2020 T. TYSOR    Stenotrophomonas maltophilia NOT DETECTED NOT DETECTED Final   Candida albicans NOT DETECTED NOT DETECTED Final   Candida auris NOT DETECTED NOT DETECTED Final   Candida glabrata NOT DETECTED NOT DETECTED Final   Candida krusei NOT DETECTED NOT DETECTED Final   Candida parapsilosis NOT  DETECTED NOT DETECTED Final   Candida tropicalis NOT DETECTED NOT DETECTED Final   Cryptococcus neoformans/gattii NOT DETECTED NOT DETECTED Final   CTX-M ESBL NOT DETECTED NOT DETECTED Final   Carbapenem resistance IMP NOT DETECTED NOT DETECTED Final   Carbapenem resistance KPC NOT DETECTED NOT DETECTED Final   Carbapenem resistance NDM NOT DETECTED NOT DETECTED Final   Carbapenem resist OXA 48 LIKE NOT DETECTED NOT DETECTED Final   Vancomycin resistance NOT DETECTED NOT DETECTED Final   Carbapenem resistance VIM NOT DETECTED NOT DETECTED Final    Comment: Performed at [facility], [address] [place], [place] [address]  MRSA PCR Screening     Status: None   Collection Time: 01/12/20  9:10 PM   Specimen: Nasal Mucosa; Nasopharyngeal  Result Value Ref Range Status   MRSA by PCR NEGATIVE NEGATIVE Final    Comment:        The GeneXpert MRSA Assay (FDA approved for NASAL  specimens only), is one component of a comprehensive MRSA colonization surveillance program. It is not intended to diagnose MRSA infection nor to guide or monitor treatment for MRSA infections. Performed at [facility], [address] [place], [place] [address]       Pertinent Lab CBC Latest Ref Rng & Units 01/15/2020 01/14/2020 01/13/2020  WBC 4.0 - 10.5 K/uL 8.4 10.7(H) 33.2(H)  Hemoglobin 13.0 - 17.0 g/dL 11.6(L) 11.0(L) 11.5(L)  Hematocrit 39.0 - 52.0 % 35.7(L) 36.1(L) 36.5(L)  Platelets 150 - 400 K/uL 202 204 287    CMP Latest Ref Rng & Units 01/15/2020 01/14/2020 01/13/2020  Glucose 70 - 99 mg/dL 97 88 945(O)  BUN 6 - 20 mg/dL 59(Y) 92(K) 46(K)  Creatinine 0.61 - 1.24 mg/dL 8.63 8.17(R) 1.16(F)  Sodium 135 - 145 mmol/L 142 142 144  Potassium 3.5 - 5.1 mmol/L 3.5 3.7 4.0  Chloride 98 - 111 mmol/L 107 110 108  CO2 22 - 32 mmol/L 24 23 24   Calcium 8.9 - 10.3 mg/dL 8.2(L) 8.0(L) 8.3(L)  Total Protein 6.5 - 8.1 g/dL - - -  Total Bilirubin 0.3 - 1.2 mg/dL - - -  Alkaline Phos 38 - 126 U/L - - -  AST 15 - 41 U/L - - -  ALT 0 - 44 U/L - - -    Pertinent Imaging today Plain films and CT images have been personally visualized and interpreted; radiology reports have been reviewed. Decision making incorporated into the Impression / Recommendations.  Electronically signed by:   Odette Fraction, MD Infectious Disease Physician [facility] Pager: [phone]

## 2020-01-16 DIAGNOSIS — A419 Sepsis, unspecified organism: Secondary | ICD-10-CM | POA: Diagnosis not present

## 2020-01-16 DIAGNOSIS — B9629 Other Escherichia coli [E. coli] as the cause of diseases classified elsewhere: Secondary | ICD-10-CM

## 2020-01-16 DIAGNOSIS — R6521 Severe sepsis with septic shock: Secondary | ICD-10-CM | POA: Diagnosis not present

## 2020-01-16 LAB — BASIC METABOLIC PANEL
Anion gap: 13 (ref 5–15)
BUN: 20 mg/dL (ref 6–20)
CO2: 23 mmol/L (ref 22–32)
Calcium: 8.4 mg/dL — ABNORMAL LOW (ref 8.9–10.3)
Chloride: 107 mmol/L (ref 98–111)
Creatinine, Ser: 1.04 mg/dL (ref 0.61–1.24)
GFR, Estimated: >60 mL/min (ref >60)
Glucose, Bld: 136 mg/dL — ABNORMAL HIGH (ref 70–99)
Potassium: 3.2 mmol/L — ABNORMAL LOW (ref 3.5–5.1)
Sodium: 143 mmol/L (ref 135–145)

## 2020-01-16 LAB — CULTURE, BLOOD (ROUTINE X 2): Special Requests: ADEQUATE

## 2020-01-16 LAB — GLUCOSE, CAPILLARY
Glucose-Capillary: 134 mg/dL — ABNORMAL HIGH (ref 70–99)
Glucose-Capillary: 155 mg/dL — ABNORMAL HIGH (ref 70–99)
Glucose-Capillary: 189 mg/dL — ABNORMAL HIGH (ref 70–99)
Glucose-Capillary: 82 mg/dL (ref 70–99)
Glucose-Capillary: 91 mg/dL (ref 70–99)
Glucose-Capillary: 97 mg/dL (ref 70–99)
Glucose-Capillary: 98 mg/dL (ref 70–99)

## 2020-01-16 MED ORDER — ENSURE ENLIVE PO LIQD
237.0000 mL | Freq: Three times a day (TID) | ORAL | Status: DC
  Administered 2020-01-16 – 2020-01-25 (×27): 237 mL via ORAL

## 2020-01-16 MED ORDER — PIPERACILLIN-TAZOBACTAM 3.375 G IVPB
3.3750 g | Freq: Three times a day (TID) | INTRAVENOUS | Status: AC
  Administered 2020-01-16 – 2020-01-22 (×20): 3.375 g via INTRAVENOUS
  Filled 2020-01-16 (×20): qty 50

## 2020-01-16 MED ORDER — ADULT MULTIVITAMIN W/MINERALS CH
1.0000 | ORAL_TABLET | Freq: Every day | ORAL | Status: DC
  Administered 2020-01-16 – 2020-01-25 (×10): 1 via ORAL
  Filled 2020-01-16 (×10): qty 1

## 2020-01-16 NOTE — Evaluation (Signed)
Occupational Therapy Evaluation Patient Details Name: Travis Cantrell MRN: [id number] DOB: 10-Apr-1960 Today's Date: 01/16/2020    History of Present Illness 59 Y O Male from NH with PMH of MS, GERD, HTN, quadriplegic, bedbound with decubitus ulcer and indwelling foleys catheter who presented to the ED 12/19 with complaints of nausea and vomiting for 2 days. He also had leaking around his Foleys. And complained of some SOB and nonproductive cough during admission. Found to be septic and admitted for treatment of septic shock.   Clinical Impression   Pt admitted with the above diagnoses and presents with below problem list. At baseline, pt is total A with basic ADLs. Pt reports he at one point had splints for hand/elbow that he benefited from but were not being applied at previous SNF. Daughter also reports concerns that while he would benefit from BUE splints his current ones are ill-fitting. Plan to further assess splinting needs next OT session.       Follow Up Recommendations  SNF Pt and daughter want pt at a different SNF than his current one   Equipment Recommendations  None recommended by OT    Recommendations for Other Services       Precautions / Restrictions Restrictions Weight Bearing Restrictions: No      Mobility Bed Mobility                    Transfers                      Balance                                           ADL either performed or assessed with clinical judgement   ADL Overall ADL's : Needs assistance/impaired (total A with ADLs at baseline)                                       General ADL Comments: total A with ADLs at baseline     Vision         Perception     Praxis      Pertinent Vitals/Pain Pain Assessment: No/denies pain     Hand Dominance Left   Extremity/Trunk Assessment Upper Extremity Assessment Upper Extremity Assessment: RUE deficits/detail;LUE deficits/detail RUE  Deficits / Details: contraction of all joints. full wrist extension with PROM. Difficulty coming out of elbow and thumb opposition contractures with PROM attempt. MCP extension to about 80* with PROM. LUE Deficits / Details: Contraction of all joints. MCP flexion to 90* with PROM, thumb opposition contracture able to come out of that minimally with PROM. Able to come about 20* into elbow extension from flexion contracture.   Lower Extremity Assessment Lower Extremity Assessment: Defer to PT evaluation       Communication Communication Communication: Expressive difficulties (soft mumbling speech)   Cognition Arousal/Alertness: Awake/alert Behavior During Therapy: Flat affect;WFL for tasks assessed/performed Overall Cognitive Status: Within Functional Limits for tasks assessed                                     General Comments       Exercises     Shoulder Instructions  Home Living Family/patient expects to be discharged to:: Skilled nursing facility                                 Additional Comments: from [facility], however would like to go elsewhere as he is not getting the care he desires, especially has bilateral hand splints but no one ever puts them on      Prior Functioning/Environment Level of Independence: Needs assistance  Gait / Transfers Assistance Needed: total Assist for all mobility, mechanical lift however does not get OOB at SNF (not his choosing) ADL's / Homemaking Assistance Needed: dependent for all ADLs/iADLs            OT Problem List: Decreased range of motion;Impaired UE functional use;Impaired tone;Decreased knowledge of precautions      OT Treatment/Interventions: Self-care/ADL training;Therapeutic exercise;DME and/or AE instruction;Splinting;Therapeutic activities;Patient/family education    OT Goals(Current goals can be found in the care plan section) Acute Rehab OT Goals Patient Stated Goal: have someone put  his braces on daily OT Goal Formulation: With patient/family Time For Goal Achievement: 01/30/20 Potential to Achieve Goals: Good  OT Frequency: Min 1X/week   Barriers to D/C:    pt and daughter want pt moved to different SNF       Co-evaluation              AM-PAC OT "6 Clicks" Daily Activity     Outcome Measure Help from another person eating meals?: Total Help from another person taking care of personal grooming?: Total Help from another person toileting, which includes using toliet, bedpan, or urinal?: Total Help from another person bathing (including washing, rinsing, drying)?: Total Help from another person to put on and taking off regular upper body clothing?: Total Help from another person to put on and taking off regular lower body clothing?: Total 6 Click Score: 6   End of Session    Activity Tolerance:   Patient left: in bed;with call bell/phone within reach (bed level eval- did not alter bed/equipment)  OT Visit Diagnosis: Muscle weakness (generalized) (M62.81);Other symptoms and signs involving the nervous system (R29.898);Other (comment) (quadraparesis)                Time: 7517-0017 OT Time Calculation (min): 13 min Charges:  OT General Charges $OT Visit: 1 Visit OT Evaluation $OT Eval Moderate Complexity: 1 Mod  Raynald Kemp, OT Acute Rehabilitation Services Pager: [phone] Office: [phone]   Pilar Grammes 01/16/2020, 3:01 PM

## 2020-01-16 NOTE — Progress Notes (Signed)
Progress Note    Travis Cantrell  GLO:[id number] DOB: 11-15-1960  DOA: 01/12/2020 PCP: Excell Seltzer, MD    Brief Narrative:    Medical records reviewed and are as summarized below:  Travis Cantrell is an 59 y.o. male who is a nursing home resident, quadriplegic with multiple sclerosis, decub ulcer and indwelling chronic Foley causing recurrent UTI admitted with 2 days of nausea, vomiting septic shock, presumed UTI.  Lactate elevated to 7 with AKI, anion gap acidosis and leukocytosis.  Foley appears clogged and was replaced in the ED.  Patient briefly required vasopressor support.  Tx to [facility] on 12/21.  Assessment/Plan:   Active Problems:   Septic shock (HCC)   Pressure injury of skin    Septic shock due bacteremia and UTI: Polymicrobial Bacteremia ( Staph species, E faecalis, Pseudomonas aeruginosa, Proteus) Continue IV fluid resuscitation IV abx ID consult: will need TEE -cards consult placed Indwelling Foley catheter was changed in the ED 01/15/20 Per ID, will hold off on TEE as the patient continues to improve clinically. Had repeat blood cultures on 12/21, will follow up.  01/16/20 Patient continues to improve clinically. ID following, recommend changing Abx to Zosyn with total antibiotic treatment duration of 10 days, today is day 5/10.Patient's culture grew Proteus, Pseudomonas, and E faecalis.   AKI secondary to sepsis, blocked Foley -foley replaced Renal ultrasound ruled out obstruction -CR trending down 01/15/20 Continue Foley catheter. Will need Urology evaluation, outpatient v inpatient due to multiple kidney stones and UTI with sepsis.  01/16/20 chronic Foley in place, has good UOP. Will need outpatient Urology eval for kidney stones.   Debilitating multiple sclerosis Patient is quadriparetic with minimal movement -from SNF Has decubitus ulcer, unstageable 01/16/20 Will need to return to SNF but patient and his family want a different facility. CM consulted  and following.   Acute respiratory failure -wean to off as tolerated 01/15/20 Resolved, undetermined cause. Per ID, no suspicion for pna.   Pressure Injury 01/12/20 Buttocks Mid Stage 2 -  Partial thickness loss of dermis presenting as a shallow open injury with a red, pink wound bed without slough. (Active)  01/12/20 1700  Location: Buttocks  Location Orientation: Mid  Staging: Stage 2 -  Partial thickness loss of dermis presenting as a shallow open injury with a red, pink wound bed without slough.  Wound Description (Comments):   Present on Admission: Yes     Pressure Injury 01/12/20 Back Right;Upper;Bilateral Stage 2 -  Partial thickness loss of dermis presenting as a shallow open injury with a red, pink wound bed without slough. covered with yeast (Active)  01/12/20 1700  Location: Back  Location Orientation: Right;Upper;Bilateral  Staging: Stage 2 -  Partial thickness loss of dermis presenting as a shallow open injury with a red, pink wound bed without slough.  Wound Description (Comments): covered with yeast  Present on Admission: Yes       Family Communication/Anticipated D/C date and plan/Code Status   DVT prophylaxis: heparin SQ Code Status: Full Code.   Disposition Plan: Status is: Inpatient  Remains inpatient appropriate because:Inpatient level of care appropriate due to severity of illness   Dispo: The patient is from: SNF              Anticipated d/c is to: SNF              Anticipated d/c date is: > 3 days  Patient currently is not medically stable to d/c.         Medical Consultants:    PCCM  ID     Subjective:   He reports having a cough earlier today but this was not recurrent. Denies dyspnea. Reports feeling better.   Objective:    Vitals:   01/15/20 1650 01/15/20 1953 01/16/20 0917 01/16/20 1624  BP: (!) 149/87 126/87 122/79 135/76  Pulse: 70 74 (!) 55 73  Resp: 18 16 18 16   Temp: 98.3 F (36.8 C) 99.2 F (37.3 C)  97.8 F (36.6 C) 97.9 F (36.6 C)  TempSrc: Oral Oral Oral Oral  SpO2: 99% 96% 99% 100%  Weight:      Height:        Intake/Output Summary (Last 24 hours) at 01/16/2020 1717 Last data filed at 01/16/2020 1500 Gross per 24 hour  Intake 231.12 ml  Output 700 ml  Net -468.88 ml   Filed Weights   01/13/20 0400 01/14/20 0500 01/15/20 0500  Weight: 74.7 kg 77.5 kg 75.6 kg    Exam: Gen: Resting in bed, in NAD Cardiac: RRR, S1 and S2 present.  Pulm: No cough, normal effort GI: Soft, NT Psych: Alert, pleasant   Data Reviewed:   I have personally reviewed following labs and imaging studies:  Labs: Labs show the following:   Basic Metabolic Panel: Recent Labs  Lab 01/12/20 1256 01/13/20 0518 01/14/20 0529 01/15/20 0124 01/16/20 0138  NA 141 144 142 142 143  K 4.2 4.0 3.7 3.5 3.2*  CL 107 108 110 107 107  CO2 14* 24 23 24 23   GLUCOSE 246* 124* 88 97 136*  BUN 55* 44* 30* 25* 20  CREATININE 4.71* 2.49* 1.50* 1.20 1.04  CALCIUM 8.9 8.3* 8.0* 8.2* 8.4*  MG  --  2.2  --   --   --   PHOS  --  2.9  --   --   --    GFR Estimated Creatinine Clearance: 79 mL/min (by C-G formula based on SCr of 1.04 mg/dL). Liver Function Tests: Recent Labs  Lab 01/12/20 1256  AST 24  ALT 18  ALKPHOS 152*  BILITOT 0.8  PROT 6.7  ALBUMIN 2.6*   No results for input(s): LIPASE, AMYLASE in the last 168 hours. No results for input(s): AMMONIA in the last 168 hours. Coagulation profile Recent Labs  Lab 01/12/20 1256  INR 1.6*    CBC: Recent Labs  Lab 01/12/20 1256 01/12/20 1521 01/13/20 0518 01/14/20 0529 01/15/20 0516  WBC 21.1* 27.4* 33.2* 10.7* 8.4  NEUTROABS 19.3*  --   --   --   --   HGB 15.1 11.7* 11.5* 11.0* 11.6*  HCT 48.4 38.3* 36.5* 36.1* 35.7*  MCV 89.6 93.0 91.0 90.5 89.7  PLT 340 256 287 204 202   Cardiac Enzymes: Recent Labs  Lab 01/15/20 0516  CKTOTAL 17*   BNP (last 3 results) No results for input(s): PROBNP in the last 8760 hours. CBG: Recent  Labs  Lab 01/16/20 0052 01/16/20 0509 01/16/20 0724 01/16/20 1207 01/16/20 1623  GLUCAP 82 98 97 155* 134*   D-Dimer: No results for input(s): DDIMER in the last 72 hours. Hgb A1c: No results for input(s): HGBA1C in the last 72 hours. Lipid Profile: No results for input(s): CHOL, HDL, LDLCALC, TRIG, CHOLHDL, LDLDIRECT in the last 72 hours. Thyroid function studies: No results for input(s): TSH, T4TOTAL, T3FREE, THYROIDAB in the last 72 hours.  Invalid input(s): FREET3 Anemia work up: No results  for input(s): VITAMINB12, FOLATE, FERRITIN, TIBC, IRON, RETICCTPCT in the last 72 hours. Sepsis Labs: Recent Labs  Lab 01/12/20 1256 01/12/20 1521 01/12/20 1757 01/13/20 0200 01/13/20 0518 01/14/20 0529 01/15/20 0516  WBC 21.1* 27.4*  --   --  33.2* 10.7* 8.4  LATICACIDVEN 7.1* 5.8* 3.5* 1.9  --   --   --     Microbiology Recent Results (from the past 240 hour(s))  Blood Culture (routine x 2)     Status: Abnormal (Preliminary result)   Collection Time: 01/12/20 12:56 PM   Specimen: BLOOD  Result Value Ref Range Status   Specimen Description BLOOD SITE NOT SPECIFIED  Final   Special Requests   Final    BOTTLES DRAWN AEROBIC AND ANAEROBIC Blood Culture results may not be optimal due to an inadequate volume of blood received in culture bottles   Culture  Setup Time   Final    GRAM NEGATIVE RODS GRAM POSITIVE COCCI IN CHAINS IN BOTH AEROBIC AND ANAEROBIC BOTTLES GRAM POSITIVE COCCI IN CLUSTERS AEROBIC BOTTLE ONLY CRITICAL RESULT CALLED TO, READ BACK BY AND VERIFIED WITH: J. LEDFORD,PHARMD [id number] 01/13/2020 T. TYSOR    Culture (A)  Final    PROTEUS MIRABILIS PSEUDOMONAS AERUGINOSA ENTEROCOCCUS FAECALIS SUSCEPTIBILITIES TO FOLLOW Performed at [facility], [address] [place], [place] [address]    Report Status PENDING  Incomplete  Blood Culture (routine x 2)     Status: Abnormal   Collection Time: 01/12/20 12:56 PM   Specimen: BLOOD LEFT HAND  Result Value Ref  Range Status   Specimen Description BLOOD LEFT HAND  Final   Special Requests   Final    BOTTLES DRAWN AEROBIC AND ANAEROBIC Blood Culture adequate volume   Culture  Setup Time   Final    GRAM NEGATIVE RODS IN BOTH AEROBIC AND ANAEROBIC BOTTLES CRITICAL VALUE NOTED.  VALUE IS CONSISTENT WITH PREVIOUSLY REPORTED AND CALLED VALUE. Performed at [facility], [address], [place], [place] [address]    Culture PSEUDOMONAS AERUGINOSA PROTEUS MIRABILIS  (A)  Final   Report Status 01/16/2020 FINAL  Final   Organism ID, Bacteria PSEUDOMONAS AERUGINOSA  Final   Organism ID, Bacteria PROTEUS MIRABILIS  Final      Susceptibility   Pseudomonas aeruginosa - MIC*    CEFTAZIDIME 4 SENSITIVE Sensitive     CIPROFLOXACIN <=0.25 SENSITIVE Sensitive     GENTAMICIN 8 INTERMEDIATE Intermediate     IMIPENEM 2 SENSITIVE Sensitive     PIP/TAZO <=4 SENSITIVE Sensitive     CEFEPIME 4 SENSITIVE Sensitive     * PSEUDOMONAS AERUGINOSA   Proteus mirabilis - MIC*    AMPICILLIN 16 INTERMEDIATE Intermediate     CEFAZOLIN 8 SENSITIVE Sensitive     CEFEPIME 0.25 SENSITIVE Sensitive     CEFTAZIDIME <=1 SENSITIVE Sensitive     CEFTRIAXONE <=0.25 SENSITIVE Sensitive     CIPROFLOXACIN >=4 RESISTANT Resistant     GENTAMICIN <=1 SENSITIVE Sensitive     IMIPENEM >=16 RESISTANT Resistant     TRIMETH/SULFA >=320 RESISTANT Resistant     AMPICILLIN/SULBACTAM 8 SENSITIVE Sensitive     PIP/TAZO <=4 SENSITIVE Sensitive     * PROTEUS MIRABILIS  Urine culture     Status: Abnormal (Preliminary result)   Collection Time: 01/12/20 12:56 PM   Specimen: In/Out Cath Urine  Result Value Ref Range Status   Specimen Description IN/OUT CATH URINE  Final   Special Requests NONE  Final   Culture (A)  Final    >=  100,000 COLONIES/mL PROTEUS MIRABILIS >=100,000 COLONIES/mL PSEUDOMONAS AERUGINOSA SUSCEPTIBILITIES TO FOLLOW Performed at [facility], [address], [place], [place] [address]    Report Status PENDING   Incomplete   Organism ID, Bacteria PROTEUS MIRABILIS (A)  Final      Susceptibility   Proteus mirabilis - MIC*    AMPICILLIN <=2 SENSITIVE Sensitive     CEFAZOLIN <=4 SENSITIVE Sensitive     CEFEPIME <=0.12 SENSITIVE Sensitive     CEFTRIAXONE <=0.25 SENSITIVE Sensitive     CIPROFLOXACIN <=0.25 SENSITIVE Sensitive     GENTAMICIN <=1 SENSITIVE Sensitive     IMIPENEM 2 SENSITIVE Sensitive     NITROFURANTOIN RESISTANT Resistant     TRIMETH/SULFA <=20 SENSITIVE Sensitive     AMPICILLIN/SULBACTAM <=2 SENSITIVE Sensitive     PIP/TAZO <=4 SENSITIVE Sensitive     * >=100,000 COLONIES/mL PROTEUS MIRABILIS  Resp Panel by RT-PCR (Flu A&B, Covid) Nasopharyngeal Swab     Status: None   Collection Time: 01/12/20 12:56 PM   Specimen: Nasopharyngeal Swab; Nasopharyngeal(NP) swabs in vial transport medium  Result Value Ref Range Status   SARS Coronavirus 2 by RT PCR NEGATIVE NEGATIVE Final    Comment: (NOTE) SARS-CoV-2 target nucleic acids are NOT DETECTED.  The SARS-CoV-2 RNA is generally detectable in upper respiratory specimens during the acute phase of infection. The lowest concentration of SARS-CoV-2 viral copies this assay can detect is 138 copies/mL. A negative result does not preclude SARS-Cov-2 infection and should not be used as the sole basis for treatment or other patient management decisions. A negative result may occur with  improper specimen collection/handling, submission of specimen other than nasopharyngeal swab, presence of viral mutation(s) within the areas targeted by this assay, and inadequate number of viral copies(<138 copies/mL). A negative result must be combined with clinical observations, patient history, and epidemiological information. The expected result is Negative.  Fact Sheet for Patients:  BloggerCourse.com  Fact Sheet for Healthcare Providers:  SeriousBroker.it  This test is no t yet approved or cleared by  the [place] FDA and  has been authorized for detection and/or diagnosis of SARS-CoV-2 by FDA under an Emergency Use Authorization (EUA). This EUA will remain  in effect (meaning this test can be used) for the duration of the COVID-19 declaration under Section 564(b)(1) of the Act, 21 U.S.C.section 360bbb-3(b)(1), unless the authorization is terminated  or revoked sooner.       Influenza A by PCR NEGATIVE NEGATIVE Final   Influenza B by PCR NEGATIVE NEGATIVE Final    Comment: (NOTE) The Xpert Xpress SARS-CoV-2/FLU/RSV plus assay is intended as an aid in the diagnosis of influenza from Nasopharyngeal swab specimens and should not be used as a sole basis for treatment. Nasal washings and aspirates are unacceptable for Xpert Xpress SARS-CoV-2/FLU/RSV testing.  Fact Sheet for Patients: BloggerCourse.com  Fact Sheet for Healthcare Providers: SeriousBroker.it  This test is not yet approved or cleared by the [place] FDA and has been authorized for detection and/or diagnosis of SARS-CoV-2 by FDA under an Emergency Use Authorization (EUA). This EUA will remain in effect (meaning this test can be used) for the duration of the COVID-19 declaration under Section 564(b)(1) of the Act, 21 U.S.C. section 360bbb-3(b)(1), unless the authorization is terminated or revoked.  Performed at [facility], [address] [place], [place] [address]   Blood Culture ID Panel (Reflexed)     Status: Abnormal   Collection Time: 01/12/20 12:56 PM  Result Value Ref Range Status  Enterococcus faecalis DETECTED (A) NOT DETECTED Final    Comment: CRITICAL RESULT CALLED TO, READ BACK BY AND VERIFIED WITH: J. LEDFORD,PHARMD 2409 01/13/2020 T. TYSOR    Enterococcus Faecium NOT DETECTED NOT DETECTED Final   Listeria monocytogenes NOT DETECTED NOT DETECTED Final   Staphylococcus species DETECTED (A) NOT DETECTED Final    Comment: CRITICAL  RESULT CALLED TO, READ BACK BY AND VERIFIED WITH: J. LEDFORD,PHARMD [id number] 01/13/2020 T. TYSOR    Staphylococcus aureus (BCID) NOT DETECTED NOT DETECTED Final   Staphylococcus epidermidis NOT DETECTED NOT DETECTED Final   Staphylococcus lugdunensis NOT DETECTED NOT DETECTED Final   Streptococcus species NOT DETECTED NOT DETECTED Final   Streptococcus agalactiae NOT DETECTED NOT DETECTED Final   Streptococcus pneumoniae NOT DETECTED NOT DETECTED Final   Streptococcus pyogenes NOT DETECTED NOT DETECTED Final   A.calcoaceticus-baumannii NOT DETECTED NOT DETECTED Final   Bacteroides fragilis NOT DETECTED NOT DETECTED Final   Enterobacterales DETECTED (A) NOT DETECTED Final    Comment: Enterobacterales represent a large order of gram negative bacteria, not a single organism. CRITICAL RESULT CALLED TO, READ BACK BY AND VERIFIED WITH: J. LEDFORD,PHARMD [id number] 01/13/2020 T. TYSOR    Enterobacter cloacae complex NOT DETECTED NOT DETECTED Final   Escherichia coli NOT DETECTED NOT DETECTED Final   Klebsiella aerogenes NOT DETECTED NOT DETECTED Final   Klebsiella oxytoca NOT DETECTED NOT DETECTED Final   Klebsiella pneumoniae NOT DETECTED NOT DETECTED Final   Proteus species DETECTED (A) NOT DETECTED Final    Comment: CRITICAL RESULT CALLED TO, READ BACK BY AND VERIFIED WITH: J. LEDFORD,PHARMD [id number] 01/13/2020 T. TYSOR    Salmonella species NOT DETECTED NOT DETECTED Final   Serratia marcescens NOT DETECTED NOT DETECTED Final   Haemophilus influenzae NOT DETECTED NOT DETECTED Final   Neisseria meningitidis NOT DETECTED NOT DETECTED Final   Pseudomonas aeruginosa DETECTED (A) NOT DETECTED Final    Comment: CRITICAL RESULT CALLED TO, READ BACK BY AND VERIFIED WITH: J. LEDFORD,PHARMD [id number] 01/13/2020 T. TYSOR    Stenotrophomonas maltophilia NOT DETECTED NOT DETECTED Final   Candida albicans NOT DETECTED NOT DETECTED Final   Candida auris NOT DETECTED NOT DETECTED Final   Candida glabrata NOT DETECTED  NOT DETECTED Final   Candida krusei NOT DETECTED NOT DETECTED Final   Candida parapsilosis NOT DETECTED NOT DETECTED Final   Candida tropicalis NOT DETECTED NOT DETECTED Final   Cryptococcus neoformans/gattii NOT DETECTED NOT DETECTED Final   CTX-M ESBL NOT DETECTED NOT DETECTED Final   Carbapenem resistance IMP NOT DETECTED NOT DETECTED Final   Carbapenem resistance KPC NOT DETECTED NOT DETECTED Final   Carbapenem resistance NDM NOT DETECTED NOT DETECTED Final   Carbapenem resist OXA 48 LIKE NOT DETECTED NOT DETECTED Final   Vancomycin resistance NOT DETECTED NOT DETECTED Final   Carbapenem resistance VIM NOT DETECTED NOT DETECTED Final    Comment: Performed at [facility], [address], [place], [place] [address]  MRSA PCR Screening     Status: None   Collection Time: 01/12/20  9:10 PM   Specimen: Nasal Mucosa; Nasopharyngeal  Result Value Ref Range Status   MRSA by PCR NEGATIVE NEGATIVE Final    Comment:        The GeneXpert MRSA Assay (FDA approved for NASAL specimens only), is one component of a comprehensive MRSA colonization surveillance program. It is not intended to diagnose MRSA infection nor to guide or monitor treatment for MRSA infections. Performed at [facility], [address], [place], [place]  [address]   Culture, blood (routine x 2)     Status: None (Preliminary result)   Collection Time: 01/14/20 10:16 AM   Specimen: BLOOD LEFT HAND  Result Value Ref Range Status   Specimen Description BLOOD LEFT HAND  Final   Special Requests   Final    BOTTLES DRAWN AEROBIC AND ANAEROBIC Blood Culture adequate volume   Culture   Final    NO GROWTH 2 DAYS Performed at [facility], [address], [place], [place] [address]    Report Status PENDING  Incomplete  Culture, blood (routine x 2)     Status: None (Preliminary result)   Collection Time: 01/14/20 10:21 AM   Specimen: BLOOD LEFT FOREARM  Result Value Ref Range Status   Specimen Description  BLOOD LEFT FOREARM  Final   Special Requests   Final    BOTTLES DRAWN AEROBIC AND ANAEROBIC Blood Culture adequate volume   Culture   Final    NO GROWTH 2 DAYS Performed at [facility], [address], [place], [place] [address]    Report Status PENDING  Incomplete    Procedures and diagnostic studies:  No results found.  Medications:   . Chlorhexidine Gluconate Cloth  6 each Topical Daily  . feeding supplement  237 mL Oral TID WC  . heparin  5,000 Units Subcutaneous Q8H  . insulin aspart  0-9 Units Subcutaneous Q4H  . multivitamin with minerals  1 tablet Oral Daily  . polyvinyl alcohol  2 drop Both Eyes Q4H   Continuous Infusions: . piperacillin-tazobactam (ZOSYN)  IV 3.375 g (01/16/20 1323)     LOS: 4 days   Ky Barban  [facility]   How to contact the [facility] Attending or Consulting provider 7A - 7P or covering provider during after hours 7P -7A, for this patient?  1. Check the care team in [facility] and look for a) attending/consulting [facility] provider listed and b) the [facility] team listed 2. Log into [email or web address] and use [facility] universal password to access. If you do not have the password, please contact the hospital operator. 3. Locate the [facility] provider you are looking for under [facility] and page to a number that you can be directly reached. 4. If you still have difficulty reaching the provider, please page the [facility] (Director on Call) for the Hospitalists listed on amion for assistance.  01/16/2020, 5:17 PM

## 2020-01-16 NOTE — Progress Notes (Signed)
[facility] Infectious Diseases Follow Up Note  Patient Identification: Patient Name: Travis Cantrell MRN: [id number] Admit Date: 01/12/2020 12:35 PM Age: 59 y.o.Today's Date: 01/16/2020   Reason for Visit: Follow up on polymicrobial bacteremia   Active Problems:   Septic shock (HCC)   Pressure injury of skin  Interval Events: . Afebrile, leukocytosis has resolved, transferred to medical floor from ICU  Assessment 59 Y O Male from NH with PMH of MS, GERD, HTN, quadriplegic, bedbound with decubitus ulcer and indwelling foleys catheteradmitted with  #Polymicrobial Bacteremia ( E faecalis, Pseudomonas aeruginosa, Proteus): Most likely source is Urinary  # Stage 1 Sacral Decubitus Ulcers - Does not appear to be infected currently   Recommendations Can de-escalate cefepime and Daptomycin to Pip/tazo to cover Proteus, Pseudomonas and E faecalis. Per micro, the GPC in clusters seen in gram stain is less likely to be staph aureus  Duration of therapy is 10 days total. Today is day 5 ? Urology evaluation given multiple kidney stones Monitor CBC, CMPwhile on IV abx Will sign off now, call with questions   Rest of the management as per the primary team. Thank you for the consult. Please page with pertinent questions or concerns.  ______________________________________________________________________ Subjective patient seen and examined at the bedside. He is out of ICU, appears comfortable and no complaints today  Vitals BP 122/79 (BP Location: Right Arm)   Pulse (!) 55   Temp 97.8 F (36.6 C) (Oral)   Resp 18   Ht 5\' 10"  (1.778 m)   Wt 75.6 kg   SpO2 99%   BMI 23.91 kg/m    Physical Exam Constitutional:  Comments:Not in acute disress, appears comfortable  Cardiovascular:  Rate and Rhythm: Normal rateand regular rhythm.  Heart sounds: No murmurheard.   Pulmonary:  Effort: Pulmonary effort is  normal.  Comments:   Abdominal:  Palpations: Abdomen isdistended, BS+ Tenderness:no suprapubic tenderness      Skin: Comments:Nop obvious rashes, lesions  Neurological:  General:quadriplegic  Psychiatric:  Mood and Affect: Moodnormal.    Pertinent Microbiology Results for orders placed or performed during the hospital encounter of 01/12/20  Blood Culture (routine x 2)     Status: Abnormal (Preliminary result)   Collection Time: 01/12/20 12:56 PM   Specimen: BLOOD  Result Value Ref Range Status   Specimen Description BLOOD SITE NOT SPECIFIED  Final   Special Requests   Final    BOTTLES DRAWN AEROBIC AND ANAEROBIC Blood Culture results may not be optimal due to an inadequate volume of blood received in culture bottles   Culture  Setup Time   Final    GRAM NEGATIVE RODS GRAM POSITIVE COCCI IN CHAINS IN BOTH AEROBIC AND ANAEROBIC BOTTLES GRAM POSITIVE COCCI IN CLUSTERS AEROBIC BOTTLE ONLY CRITICAL RESULT CALLED TO, READ BACK BY AND VERIFIED WITH: J. LEDFORD,PHARMD 01/14/20 01/13/2020 T. TYSOR    Culture (A)  Final    PROTEUS MIRABILIS PSEUDOMONAS AERUGINOSA ENTEROCOCCUS FAECALIS SUSCEPTIBILITIES TO FOLLOW Performed at [facility], [address], [place], [place]    Report Status PENDING  Incomplete  Blood Culture (routine x 2)     Status: Abnormal   Collection Time: 01/12/20 12:56 PM   Specimen: BLOOD LEFT HAND  Result Value Ref Range Status   Specimen Description BLOOD LEFT HAND  Final   Special Requests   Final    BOTTLES DRAWN AEROBIC AND ANAEROBIC Blood Culture adequate volume   Culture  Setup Time   Final    GRAM  NEGATIVE RODS IN BOTH AEROBIC AND ANAEROBIC BOTTLES CRITICAL VALUE NOTED.  VALUE IS CONSISTENT WITH PREVIOUSLY REPORTED AND CALLED VALUE. Performed at [facility], [address] [place], [place] [address]    Culture PSEUDOMONAS AERUGINOSA PROTEUS MIRABILIS  (A)  Final   Report Status  01/16/2020 FINAL  Final   Organism ID, Bacteria PSEUDOMONAS AERUGINOSA  Final   Organism ID, Bacteria PROTEUS MIRABILIS  Final      Susceptibility   Pseudomonas aeruginosa - MIC*    CEFTAZIDIME 4 SENSITIVE Sensitive     CIPROFLOXACIN <=0.25 SENSITIVE Sensitive     GENTAMICIN 8 INTERMEDIATE Intermediate     IMIPENEM 2 SENSITIVE Sensitive     PIP/TAZO <=4 SENSITIVE Sensitive     CEFEPIME 4 SENSITIVE Sensitive     * PSEUDOMONAS AERUGINOSA   Proteus mirabilis - MIC*    AMPICILLIN 16 INTERMEDIATE Intermediate     CEFAZOLIN 8 SENSITIVE Sensitive     CEFEPIME 0.25 SENSITIVE Sensitive     CEFTAZIDIME <=1 SENSITIVE Sensitive     CEFTRIAXONE <=0.25 SENSITIVE Sensitive     CIPROFLOXACIN >=4 RESISTANT Resistant     GENTAMICIN <=1 SENSITIVE Sensitive     IMIPENEM >=16 RESISTANT Resistant     TRIMETH/SULFA >=320 RESISTANT Resistant     AMPICILLIN/SULBACTAM 8 SENSITIVE Sensitive     PIP/TAZO <=4 SENSITIVE Sensitive     * PROTEUS MIRABILIS  Urine culture     Status: Abnormal (Preliminary result)   Collection Time: 01/12/20 12:56 PM   Specimen: In/Out Cath Urine  Result Value Ref Range Status   Specimen Description IN/OUT CATH URINE  Final   Special Requests NONE  Final   Culture (A)  Final    >=100,000 COLONIES/mL PROTEUS MIRABILIS >=100,000 COLONIES/mL PSEUDOMONAS AERUGINOSA SUSCEPTIBILITIES TO FOLLOW Performed at [facility], [address] [place], [place] [address]    Report Status PENDING  Incomplete   Organism ID, Bacteria PROTEUS MIRABILIS (A)  Final      Susceptibility   Proteus mirabilis - MIC*    AMPICILLIN <=2 SENSITIVE Sensitive     CEFAZOLIN <=4 SENSITIVE Sensitive     CEFEPIME <=0.12 SENSITIVE Sensitive     CEFTRIAXONE <=0.25 SENSITIVE Sensitive     CIPROFLOXACIN <=0.25 SENSITIVE Sensitive     GENTAMICIN <=1 SENSITIVE Sensitive     IMIPENEM 2 SENSITIVE Sensitive     NITROFURANTOIN RESISTANT Resistant     TRIMETH/SULFA <=20 SENSITIVE Sensitive      AMPICILLIN/SULBACTAM <=2 SENSITIVE Sensitive     PIP/TAZO <=4 SENSITIVE Sensitive     * >=100,000 COLONIES/mL PROTEUS MIRABILIS  Resp Panel by RT-PCR (Flu A&B, Covid) Nasopharyngeal Swab     Status: None   Collection Time: 01/12/20 12:56 PM   Specimen: Nasopharyngeal Swab; Nasopharyngeal(NP) swabs in vial transport medium  Result Value Ref Range Status   SARS Coronavirus 2 by RT PCR NEGATIVE NEGATIVE Final    Comment: (NOTE) SARS-CoV-2 target nucleic acids are NOT DETECTED.  The SARS-CoV-2 RNA is generally detectable in upper respiratory specimens during the acute phase of infection. The lowest concentration of SARS-CoV-2 viral copies this assay can detect is 138 copies/mL. A negative result does not preclude SARS-Cov-2 infection and should not be used as the sole basis for treatment or other patient management decisions. A negative result may occur with  improper specimen collection/handling, submission of specimen other than nasopharyngeal swab, presence of viral mutation(s) within the areas targeted by this assay, and inadequate number of viral copies(<138 copies/mL). A negative result  must be combined with clinical observations, patient history, and epidemiological information. The expected result is Negative.  Fact Sheet for Patients:  BloggerCourse.com  Fact Sheet for Healthcare Providers:  SeriousBroker.it  This test is no t yet approved or cleared by the [place] FDA and  has been authorized for detection and/or diagnosis of SARS-CoV-2 by FDA under an Emergency Use Authorization (EUA). This EUA will remain  in effect (meaning this test can be used) for the duration of the COVID-19 declaration under Section 564(b)(1) of the Act, 21 U.S.C.section 360bbb-3(b)(1), unless the authorization is terminated  or revoked sooner.       Influenza A by PCR NEGATIVE NEGATIVE Final   Influenza B by PCR NEGATIVE NEGATIVE Final     Comment: (NOTE) The Xpert Xpress SARS-CoV-2/FLU/RSV plus assay is intended as an aid in the diagnosis of influenza from Nasopharyngeal swab specimens and should not be used as a sole basis for treatment. Nasal washings and aspirates are unacceptable for Xpert Xpress SARS-CoV-2/FLU/RSV testing.  Fact Sheet for Patients: BloggerCourse.com  Fact Sheet for Healthcare Providers: SeriousBroker.it  This test is not yet approved or cleared by the [place] FDA and has been authorized for detection and/or diagnosis of SARS-CoV-2 by FDA under an Emergency Use Authorization (EUA). This EUA will remain in effect (meaning this test can be used) for the duration of the COVID-19 declaration under Section 564(b)(1) of the Act, 21 U.S.C. section 360bbb-3(b)(1), unless the authorization is terminated or revoked.  Performed at [facility], [address], [place], [place] [address]   Blood Culture ID Panel (Reflexed)     Status: Abnormal   Collection Time: 01/12/20 12:56 PM  Result Value Ref Range Status   Enterococcus faecalis DETECTED (A) NOT DETECTED Final    Comment: CRITICAL RESULT CALLED TO, READ BACK BY AND VERIFIED WITH: J. LEDFORD,PHARMD [id number] 01/13/2020 T. TYSOR    Enterococcus Faecium NOT DETECTED NOT DETECTED Final   Listeria monocytogenes NOT DETECTED NOT DETECTED Final   Staphylococcus species DETECTED (A) NOT DETECTED Final    Comment: CRITICAL RESULT CALLED TO, READ BACK BY AND VERIFIED WITH: J. LEDFORD,PHARMD [id number] 01/13/2020 T. TYSOR    Staphylococcus aureus (BCID) NOT DETECTED NOT DETECTED Final   Staphylococcus epidermidis NOT DETECTED NOT DETECTED Final   Staphylococcus lugdunensis NOT DETECTED NOT DETECTED Final   Streptococcus species NOT DETECTED NOT DETECTED Final   Streptococcus agalactiae NOT DETECTED NOT DETECTED Final   Streptococcus pneumoniae NOT DETECTED NOT DETECTED Final   Streptococcus pyogenes NOT  DETECTED NOT DETECTED Final   A.calcoaceticus-baumannii NOT DETECTED NOT DETECTED Final   Bacteroides fragilis NOT DETECTED NOT DETECTED Final   Enterobacterales DETECTED (A) NOT DETECTED Final    Comment: Enterobacterales represent a large order of gram negative bacteria, not a single organism. CRITICAL RESULT CALLED TO, READ BACK BY AND VERIFIED WITH: J. LEDFORD,PHARMD [id number] 01/13/2020 T. TYSOR    Enterobacter cloacae complex NOT DETECTED NOT DETECTED Final   Escherichia coli NOT DETECTED NOT DETECTED Final   Klebsiella aerogenes NOT DETECTED NOT DETECTED Final   Klebsiella oxytoca NOT DETECTED NOT DETECTED Final   Klebsiella pneumoniae NOT DETECTED NOT DETECTED Final   Proteus species DETECTED (A) NOT DETECTED Final    Comment: CRITICAL RESULT CALLED TO, READ BACK BY AND VERIFIED WITH: J. LEDFORD,PHARMD [id number] 01/13/2020 T. TYSOR    Salmonella species NOT DETECTED NOT DETECTED Final   Serratia marcescens NOT DETECTED NOT DETECTED Final   Haemophilus influenzae NOT DETECTED NOT DETECTED Final  Neisseria meningitidis NOT DETECTED NOT DETECTED Final   Pseudomonas aeruginosa DETECTED (A) NOT DETECTED Final    Comment: CRITICAL RESULT CALLED TO, READ BACK BY AND VERIFIED WITH: J. LEDFORD,PHARMD [id number] 01/13/2020 T. TYSOR    Stenotrophomonas maltophilia NOT DETECTED NOT DETECTED Final   Candida albicans NOT DETECTED NOT DETECTED Final   Candida auris NOT DETECTED NOT DETECTED Final   Candida glabrata NOT DETECTED NOT DETECTED Final   Candida krusei NOT DETECTED NOT DETECTED Final   Candida parapsilosis NOT DETECTED NOT DETECTED Final   Candida tropicalis NOT DETECTED NOT DETECTED Final   Cryptococcus neoformans/gattii NOT DETECTED NOT DETECTED Final   CTX-M ESBL NOT DETECTED NOT DETECTED Final   Carbapenem resistance IMP NOT DETECTED NOT DETECTED Final   Carbapenem resistance KPC NOT DETECTED NOT DETECTED Final   Carbapenem resistance NDM NOT DETECTED NOT DETECTED Final   Carbapenem  resist OXA 48 LIKE NOT DETECTED NOT DETECTED Final   Vancomycin resistance NOT DETECTED NOT DETECTED Final   Carbapenem resistance VIM NOT DETECTED NOT DETECTED Final    Comment: Performed at [facility], [address] [place], [place] [address]  MRSA PCR Screening     Status: None   Collection Time: 01/12/20  9:10 PM   Specimen: Nasal Mucosa; Nasopharyngeal  Result Value Ref Range Status   MRSA by PCR NEGATIVE NEGATIVE Final    Comment:        The GeneXpert MRSA Assay (FDA approved for NASAL specimens only), is one component of a comprehensive MRSA colonization surveillance program. It is not intended to diagnose MRSA infection nor to guide or monitor treatment for MRSA infections. Performed at [facility], [address], [place], [place] [address]   Culture, blood (routine x 2)     Status: None (Preliminary result)   Collection Time: 01/14/20 10:16 AM   Specimen: BLOOD LEFT HAND  Result Value Ref Range Status   Specimen Description BLOOD LEFT HAND  Final   Special Requests   Final    BOTTLES DRAWN AEROBIC AND ANAEROBIC Blood Culture adequate volume   Culture   Final    NO GROWTH 2 DAYS Performed at [facility], [address], [place], [place] [address]    Report Status PENDING  Incomplete  Culture, blood (routine x 2)     Status: None (Preliminary result)   Collection Time: 01/14/20 10:21 AM   Specimen: BLOOD LEFT FOREARM  Result Value Ref Range Status   Specimen Description BLOOD LEFT FOREARM  Final   Special Requests   Final    BOTTLES DRAWN AEROBIC AND ANAEROBIC Blood Culture adequate volume   Culture   Final    NO GROWTH 2 DAYS Performed at [facility], [address], [place], [place] [address]    Report Status PENDING  Incomplete    Pertinent Lab. CBC Latest Ref Rng & Units 01/15/2020 01/14/2020 01/13/2020  WBC 4.0 - 10.5 K/uL 8.4 10.7(H) 33.2(H)  Hemoglobin 13.0 - 17.0 g/dL 11.6(L) 11.0(L) 11.5(L)  Hematocrit 39.0 - 52.0 %  35.7(L) 36.1(L) 36.5(L)  Platelets 150 - 400 K/uL 202 204 287   CMP Latest Ref Rng & Units 01/16/2020 01/15/2020 01/14/2020  Glucose 70 - 99 mg/dL 784(O) 97 88  BUN 6 - 20 mg/dL 20 96(E) 95(M)  Creatinine 0.61 - 1.24 mg/dL 8.41 3.24 4.01(U)  Sodium 135 - 145 mmol/L 143 142 142  Potassium 3.5 - 5.1 mmol/L 3.2(L) 3.5 3.7  Chloride 98 - 111 mmol/L  107 107 110  CO2 22 - 32 mmol/L Calcium 8.9 - 10.3 mg/dL 1.6(X) 0.9(U) 0.4(V)  Total Protein 6.5 - 8.1 g/dL - - -  Total Bilirubin 0.3 - 1.2 mg/dL - - -  Alkaline Phos 38 - 126 U/L - - -  AST 15 - 41 U/L - - -  ALT 0 - 44 U/L - - -    Pertinent Imaging today Plain films and CT images have been personally visualized and interpreted; radiology reports have been reviewed. Decision making incorporated into the Impression / Recommendations.   Electronically signed by:   Odette Fraction, MD Infectious Disease Physician [facility] Pager: [phone]

## 2020-01-16 NOTE — Plan of Care (Signed)
  Problem: Nutrition: Goal: Adequate nutrition will be maintained Outcome: Progressing   Problem: Pain Managment: Goal: General experience of comfort will improve Outcome: Progressing   

## 2020-01-16 NOTE — Progress Notes (Signed)
Initial Nutrition Assessment  DOCUMENTATION CODES:   Non-severe (moderate) malnutrition in context of chronic illness  INTERVENTION:   -Feeding assistance with meals -MVI with minerals daily -Magic cup TID with meals, each supplement provides 290 kcal and 9 grams of protein -Ensure Enlive po TID, each supplement provides 350 kcal and 20 grams of protein  NUTRITION DIAGNOSIS:   Moderate Malnutrition related to chronic illness (MS) as evidenced by energy intake < or equal to 75% for > or equal to 1 month,mild fat depletion,moderate fat depletion,mild muscle depletion,moderate muscle depletion.  GOAL:   Patient will meet greater than or equal to 90% of their needs  MONITOR:   Supplement acceptance,PO intake,Labs,Weight trends,Skin,I & O's  REASON FOR ASSESSMENT:   Low Braden    ASSESSMENT:   Travis Cantrell is an 59 y.o. male who is a nursing home resident, quadriplegic with multiple sclerosis, decub ulcer and indwelling chronic Foley causing recurrent UTI admitted with 2 days of nausea, vomiting septic shock, presumed UTI.  Lactate elevated to 7 with AKI, anion gap acidosis and leukocytosis.  Foley appears clogged and was replaced in the ED.  Patient briefly required vasopressor support.  Pt admitted with sepsis secondary to bacteremia and UTI.   Reviewed I/O's: -1.3 L x 24 hours and -1.9 L since admission  UOP: 1.5 L x 24 hours  Case discussed with RN, who reports pt with good appetite, however, requires feeding assistance secondary to quadriplegia.   Spoke with pt at bedside, who was pleasant and in good spirits. He reports a general decline health over the past year, particularly losing his ability to walk, even with an assistive device. He shares that he moved to a SNF about 3 months ago and was previously living with his daughter, who helped care for him. Pt reports his intake has been very poor over the past 3 months due to dislike of the food options at SNF. He estimates  he generally consumes 25% of his meals. He denies consuming supplements PTA, but consumed 2-3 Ensure supplements PTA when he lived with his daughter.   Observed breakfast tray- pt consumed a few bites of pancakes. Per pt, "those were really big pancakes". Documented meal completions 25-75%, averaging 50% at meals.   Reviewed wt hx; pt has experienced a 7.4% wt loss over the past 7 months, which is not significant for time frame. Pt endorses wt loss, reporting he used he weight about 200# at baseline. Suspect edema may also be masking further weight loss as well as fat and muscle depletion.   Discussed importance of good meal and supplement intake to promote healing. Pt amenable to Ensure and Magic Cup supplements.   Labs reviewed: K: 3.2, CBGS: 82-119 (inpatient orders for glycemic control are 0-9 units insulin aspart every 4 hours).   NUTRITION - FOCUSED PHYSICAL EXAM:  Flowsheet Row Most Recent Value  Orbital Region Moderate depletion  Upper Arm Region Mild depletion  Thoracic and Lumbar Region No depletion  Buccal Region Mild depletion  Temple Region Moderate depletion  Clavicle Bone Region No depletion  Clavicle and Acromion Bone Region No depletion  Scapular Bone Region No depletion  Dorsal Hand Mild depletion  Patellar Region Moderate depletion  Anterior Thigh Region Moderate depletion  Posterior Calf Region Moderate depletion  Edema (RD Assessment) Mild  Hair Reviewed  Eyes Reviewed  Mouth Reviewed  Skin Reviewed  Nails Reviewed       Diet Order:   Diet Order  Diet regular Room service appropriate? Yes; Fluid consistency: Thin  Diet effective now                 EDUCATION NEEDS:   Education needs have been addressed  Skin:  Skin Assessment: Skin Integrity Issues: Skin Integrity Issues:: Stage II Stage II: buttocks, rt upper back  Last BM:  Unknown  Height:   Ht Readings from Last 1 Encounters:  01/13/20 5\' 10"  (1.778 m)    Weight:   Wt  Readings from Last 1 Encounters:  01/15/20 75.6 kg    Ideal Body Weight:  66.1 kg (adjusted for quadriplegia)  BMI:  Body mass index is 23.91 kg/m.  Estimated Nutritional Needs:   Kcal:  2100-2300  Protein:  115-130 grams  Fluid:  > 2 L    01/17/20, RD, LDN, CDCES Registered Dietitian II Certified Diabetes Care and Education Specialist Please refer to [facility] for RD and/or RD on-call/weekend/after hours pager

## 2020-01-17 ENCOUNTER — Other Ambulatory Visit: Payer: Self-pay

## 2020-01-17 DIAGNOSIS — E440 Moderate protein-calorie malnutrition: Secondary | ICD-10-CM | POA: Diagnosis present

## 2020-01-17 DIAGNOSIS — R6521 Severe sepsis with septic shock: Secondary | ICD-10-CM | POA: Diagnosis not present

## 2020-01-17 DIAGNOSIS — A419 Sepsis, unspecified organism: Secondary | ICD-10-CM | POA: Diagnosis not present

## 2020-01-17 LAB — BASIC METABOLIC PANEL
Anion gap: 9 (ref 5–15)
BUN: 20 mg/dL (ref 6–20)
CO2: 26 mmol/L (ref 22–32)
Calcium: 8.6 mg/dL — ABNORMAL LOW (ref 8.9–10.3)
Chloride: 107 mmol/L (ref 98–111)
Creatinine, Ser: 1.23 mg/dL (ref 0.61–1.24)
GFR, Estimated: >60 mL/min (ref >60)
Glucose, Bld: 105 mg/dL — ABNORMAL HIGH (ref 70–99)
Potassium: 4 mmol/L (ref 3.5–5.1)
Sodium: 142 mmol/L (ref 135–145)

## 2020-01-17 LAB — GLUCOSE, CAPILLARY
Glucose-Capillary: 105 mg/dL — ABNORMAL HIGH (ref 70–99)
Glucose-Capillary: 95 mg/dL (ref 70–99)
Glucose-Capillary: 103 mg/dL — ABNORMAL HIGH (ref 70–99)
Glucose-Capillary: 188 mg/dL — ABNORMAL HIGH (ref 70–99)
Glucose-Capillary: 75 mg/dL (ref 70–99)
Glucose-Capillary: 210 mg/dL — ABNORMAL HIGH (ref 70–99)

## 2020-01-17 LAB — URINE CULTURE: Culture: >=100000 — AB

## 2020-01-17 LAB — MAGNESIUM: Magnesium: 1.7 mg/dL (ref 1.7–2.4)

## 2020-01-17 LAB — CULTURE, BLOOD (ROUTINE X 2)

## 2020-01-17 MED ORDER — MAGNESIUM SULFATE 50 % IJ SOLN
1.0000 g | Freq: Once | INTRAMUSCULAR | Status: DC
  Filled 2020-01-17: qty 2

## 2020-01-17 MED ORDER — POLYETHYLENE GLYCOL 3350 17 G PO PACK
17.0000 g | PACK | Freq: Every day | ORAL | Status: DC
  Administered 2020-01-17 – 2020-01-25 (×8): 17 g via ORAL
  Filled 2020-01-17 (×9): qty 1

## 2020-01-17 MED ORDER — DOCUSATE SODIUM 100 MG PO CAPS
100.0000 mg | ORAL_CAPSULE | Freq: Two times a day (BID) | ORAL | Status: DC
  Administered 2020-01-17 – 2020-01-25 (×17): 100 mg via ORAL
  Filled 2020-01-17 (×17): qty 1

## 2020-01-17 MED ORDER — MAGNESIUM SULFATE IN D5W 1-5 GM/100ML-% IV SOLN
1.0000 g | Freq: Once | INTRAVENOUS | Status: AC
  Administered 2020-01-17: 09:00:00 1 g via INTRAVENOUS
  Filled 2020-01-17: qty 100

## 2020-01-17 NOTE — Progress Notes (Signed)
Physical Therapy Treatment Patient Details Name: Travis Cantrell MRN: [id number] DOB: 1960/11/26 Today's Date: 01/17/2020    History of Present Illness 59 Y O Male from NH with PMH of MS, GERD, HTN, quadriplegic, bedbound with decubitus ulcer and indwelling foleys catheter who presented to the ED 12/19 with complaints of nausea and vomiting for 2 days. He also had leaking around his Foleys. And complained of some SOB and nonproductive cough during admission. Found to be septic and admitted for treatment of septic shock.    PT Comments    Patient remains Total Assist for all mobility and ADL's; Travis Cantrell has been his baseline for >5years. Continued with therapeutic exercises for PROM to all LE joints to maintain muscle length as pt has contractures in bil UE/LE. Patient transfer vis Maximove today to recliner as he has not been out of bed yet and pillows used to facilitate upright posture and prevent slouching or lateral lean in chair. Educated pt to sit for only 1-2 hours and informed RN/NT staff as well. Continue to recommend pt dc to SNF level for follow up therapy and care. Will continue to follow in acute setting to address ROM impairments and to encourage mobility.   Follow Up Recommendations  SNF     Equipment Recommendations  None recommended by PT    Recommendations for Other Services       Precautions / Restrictions Precautions Precautions: Fall Precaution Comments: quadraparesis due to MS Restrictions Weight Bearing Restrictions: No    Mobility  Bed Mobility Overal bed mobility: Needs Assistance Bed Mobility: Rolling Rolling: Total assist         General bed mobility comments: 1 person Total assist to roll to position lift sling under pt.  Transfers Overall transfer level: Needs assistance               General transfer comment: Maximove bed>chair. Total assist to reposition in chair once seated. Pillows under pt for cushioning and positioned posteriorly for  posture and to prevent pt drifting laterally in recliner.  Ambulation/Gait                 Stairs             Wheelchair Mobility    Modified Rankin (Stroke Patients Only)       Balance Overall balance assessment: Needs assistance Sitting-balance support: Feet supported;Bilateral upper extremity supported Sitting balance-Leahy Scale: Zero Sitting balance - Comments: pt up to chair for the first time. pt with poor trunk control and pillows required for positioning.                                    Cognition Arousal/Alertness: Awake/alert Behavior During Therapy: Flat affect;WFL for tasks assessed/performed Overall Cognitive Status: No family/caregiver present to determine baseline cognitive functioning                                 General Comments: pt wtih decresaed awareness of deficits saying"I sold my lift from home, I'm not going to need it I'll ge tup and go". pt has been bedbound for >5 yrs.      Exercises General Exercises - Lower Extremity Ankle Circles/Pumps: PROM;Both;15 reps;Supine Heel Slides: PROM;Both;15 reps;Supine Hip ABduction/ADduction: PROM;Both;15 reps;Supine Hip Flexion/Marching: PROM;Both;15 reps;Supine (int/ext rotation for hips attempted, pt very tight.)    General Comments  Pertinent Vitals/Pain Pain Assessment: No/denies pain    Home Living                      Prior Function            PT Goals (current goals can now be found in the care plan section) Acute Rehab PT Goals Patient Stated Goal: wear braces daily for UE's and have LE boots in proper position. PT Goal Formulation: With patient Time For Goal Achievement: 01/29/20 Potential to Achieve Goals: Fair Progress towards PT goals: Progressing toward goals    Frequency    Min 2X/week      PT Plan Current plan remains appropriate    Co-evaluation              AM-PAC PT "6 Clicks" Mobility   Outcome  Measure  Help needed turning from your back to your side while in a flat bed without using bedrails?: Total Help needed moving from lying on your back to sitting on the side of a flat bed without using bedrails?: Total Help needed moving to and from a bed to a chair (including a wheelchair)?: Total Help needed standing up from a chair using your arms (e.g., wheelchair or bedside chair)?: Total Help needed to walk in hospital room?: Total Help needed climbing 3-5 steps with a railing? : Total 6 Click Score: 6    End of Session   Activity Tolerance: Patient tolerated treatment well Patient left: in chair;with call bell/phone within reach (pillows under hips to prevent sliding anteriorly in chair, lift sling underneath pt) Nurse Communication: Mobility status PT Visit Diagnosis: Muscle weakness (generalized) (M62.81);Difficulty in walking, not elsewhere classified (R26.2);Other symptoms and signs involving the nervous system (R29.898)     Time: 6599-3570 PT Time Calculation (min) (ACUTE ONLY): 31 min  Charges:  $Therapeutic Exercise: 8-22 mins $Therapeutic Activity: 8-22 mins                     Wynn Maudlin, DPT Acute Rehabilitation Services Office [phone] Pager [phone]     Anitra Lauth 01/17/2020, 12:29 PM

## 2020-01-17 NOTE — Progress Notes (Signed)
Progress Note    Travis Cantrell  ZOX:[id number] DOB: August 08, 1960  DOA: 01/12/2020 PCP: Excell Seltzer, MD    Brief Narrative:    Medical records reviewed and are as summarized below:  Travis Cantrell is an 59 y.o. male who is a nursing home resident, quadriplegic with multiple sclerosis, decub ulcer and indwelling chronic Foley causing recurrent UTI admitted with 2 days of nausea, vomiting septic shock, presumed UTI.  Lactate elevated to 7 with AKI, anion gap acidosis and leukocytosis.  Foley appears clogged and was replaced in the ED.  Patient briefly required vasopressor support.  Tx to [facility] on 12/21. Per ID, patient will require IV Abx until 01/22/20.  Assessment/Plan:   Active Problems:   Septic shock (HCC)   Pressure injury of skin   Malnutrition of moderate degree    Septic shock due bacteremia and UTI: Polymicrobial Bacteremia ( Staph species, E faecalis, Pseudomonas aeruginosa, Proteus) Continue IV fluid resuscitation IV abx ID consult: will need TEE -cards consult placed Indwelling Foley catheter was changed in the ED 01/15/20 Per ID, will hold off on TEE as the patient continues to improve clinically. Had repeat blood cultures on 12/21, will follow up.  01/16/20 Patient continues to improve clinically. ID following, recommend changing Abx to Zosyn with total antibiotic treatment duration of 10 days, today is day 5/10.Patient's culture grew Proteus, Pseudomonas, and E faecalis.  01/17/20 Continue IV Zosyn, end date of 01/22/20 to complete 10 days of antibiotic treatment.   AKI secondary to sepsis, blocked Foley -foley replaced Renal ultrasound ruled out obstruction -CR trending down 01/15/20 Continue Foley catheter. Will need Urology evaluation, outpatient v inpatient due to multiple kidney stones and UTI with sepsis.  01/16/20 chronic Foley in place, has good UOP. Will need outpatient Urology eval for kidney stones.  01/17/20 Scr with slight increase today but good  UOP. Will continue monitoring. Encourage oral hydration.   Debilitating multiple sclerosis Patient is quadriparetic with minimal movement -from SNF Has decubitus ulcer, unstageable 01/16/20 Will need to return to SNF but patient and his family want a different facility. CM consulted and following.  01/17/20 Does not tolerate baclofen as it causes diarrhea. OT has seen him today, will resume braces for his UE.   Acute respiratory failure -wean to off as tolerated 01/15/20 Resolved, undetermined cause. Per ID, no suspicion for pna.   Pressure Injury 01/12/20 Buttocks Mid Stage 2 -  Partial thickness loss of dermis presenting as a shallow open injury with a red, pink wound bed without slough. (Active)  01/12/20 1700  Location: Buttocks  Location Orientation: Mid  Staging: Stage 2 -  Partial thickness loss of dermis presenting as a shallow open injury with a red, pink wound bed without slough.  Wound Description (Comments):   Present on Admission: Yes     Pressure Injury 01/12/20 Back Right;Upper;Bilateral Stage 2 -  Partial thickness loss of dermis presenting as a shallow open injury with a red, pink wound bed without slough. covered with yeast (Active)  01/12/20 1700  Location: Back  Location Orientation: Right;Upper;Bilateral  Staging: Stage 2 -  Partial thickness loss of dermis presenting as a shallow open injury with a red, pink wound bed without slough.  Wound Description (Comments): covered with yeast  Present on Admission: Yes    Constipation Reports not BM in 5 days. Denies abdominal pain or nausea.  Will start scheduled docusate and Miralax.   Low magnesium Ordered repletion for today.    Family  Communication/Anticipated D/C date and plan/Code Status   DVT prophylaxis: heparin SQ Code Status: Full Code.   Disposition Plan: Status is: Inpatient  Remains inpatient appropriate because:Inpatient level of care appropriate due to severity of illness   Dispo: The  patient is from: SNF              Anticipated d/c is to: SNF              Anticipated d/c date is: > 3 days              Patient currently is not medically stable to d/c.         Medical Consultants:    PCCM  ID     Subjective:   He reports feeling constipated with no BM in 5 days. Reports that he takes Miralax usually. Denies nausea or abdominal pain.   Objective:    Vitals:   01/16/20 1624 01/16/20 2016 01/17/20 0330 01/17/20 0902  BP: 135/76 115/81 129/79 113/78  Pulse: 73 79 63 64  Resp: Temp: 97.9 F (36.6 C) 97.8 F (36.6 C) 98.3 F (36.8 C) 97.9 F (36.6 C)  TempSrc: Oral Oral Oral Oral  SpO2: 100% 98% 98% 99%  Weight:      Height:        Intake/Output Summary (Last 24 hours) at 01/17/2020 1503 Last data filed at 01/17/2020 1300 Gross per 24 hour  Intake 837 ml  Output 550 ml  Net 287 ml   Filed Weights   01/13/20 0400 01/14/20 0500 01/15/20 0500  Weight: 74.7 kg 77.5 kg 75.6 kg    Exam: Gen: Resting in bed, in NAD Cardiac: RRR, S1 and S2 present.  Pulm: No cough, normal effort GI: Soft, NT Psych: Alert, pleasant   Data Reviewed:   I have personally reviewed following labs:  Labs: Labs show the following:   Basic Metabolic Panel: Recent Labs  Lab 01/13/20 0518 01/14/20 0529 01/15/20 0124 01/16/20 0138 01/17/20 0310  NA 144 142 142 143 142  K 4.0 3.7 3.5 3.2* 4.0  CL 108 110 107 107 107  CO2 GLUCOSE 124* 88 97 136* 105*  BUN 44* 30* 25* 20 20  CREATININE 2.49* 1.50* 1.20 1.04 1.23  CALCIUM 8.3* 8.0* 8.2* 8.4* 8.6*  MG 2.2  --   --   --  1.7  PHOS 2.9  --   --   --   --    GFR Estimated Creatinine Clearance: 66.8 mL/min (by C-G formula based on SCr of 1.23 mg/dL). Liver Function Tests: Recent Labs  Lab 01/12/20 1256  AST 24  ALT 18  ALKPHOS 152*  BILITOT 0.8  PROT 6.7  ALBUMIN 2.6*   No results for input(s): LIPASE, AMYLASE in the last 168 hours. No results for input(s): AMMONIA  in the last 168 hours. Coagulation profile Recent Labs  Lab 01/12/20 1256  INR 1.6*    CBC: Recent Labs  Lab 01/12/20 1256 01/12/20 1521 01/13/20 0518 01/14/20 0529 01/15/20 0516  WBC 21.1* 27.4* 33.2* 10.7* 8.4  NEUTROABS 19.3*  --   --   --   --   HGB 15.1 11.7* 11.5* 11.0* 11.6*  HCT 48.4 38.3* 36.5* 36.1* 35.7*  MCV 89.6 93.0 91.0 90.5 89.7  PLT 340 256 287 204 202   Cardiac Enzymes: Recent Labs  Lab 01/15/20 0516  CKTOTAL 17*   BNP (last 3 results) No results for input(s): PROBNP in  the last 8760 hours. CBG: Recent Labs  Lab 01/16/20 2252 01/17/20 0116 01/17/20 0404 01/17/20 0859 01/17/20 1219  GLUCAP 91 105* 95 103* 188*   D-Dimer: No results for input(s): DDIMER in the last 72 hours. Hgb A1c: No results for input(s): HGBA1C in the last 72 hours. Lipid Profile: No results for input(s): CHOL, HDL, LDLCALC, TRIG, CHOLHDL, LDLDIRECT in the last 72 hours. Thyroid function studies: No results for input(s): TSH, T4TOTAL, T3FREE, THYROIDAB in the last 72 hours.  Invalid input(s): FREET3 Anemia work up: No results for input(s): VITAMINB12, FOLATE, FERRITIN, TIBC, IRON, RETICCTPCT in the last 72 hours. Sepsis Labs: Recent Labs  Lab 01/12/20 1256 01/12/20 1521 01/12/20 1757 01/13/20 0200 01/13/20 0518 01/14/20 0529 01/15/20 0516  WBC 21.1* 27.4*  --   --  33.2* 10.7* 8.4  LATICACIDVEN 7.1* 5.8* 3.5* 1.9  --   --   --     Microbiology Recent Results (from the past 240 hour(s))  Blood Culture (routine x 2)     Status: Abnormal   Collection Time: 01/12/20 12:56 PM   Specimen: BLOOD  Result Value Ref Range Status   Specimen Description BLOOD SITE NOT SPECIFIED  Final   Special Requests   Final    BOTTLES DRAWN AEROBIC AND ANAEROBIC Blood Culture results may not be optimal due to an inadequate volume of blood received in culture bottles   Culture  Setup Time   Final    GRAM NEGATIVE RODS GRAM POSITIVE COCCI IN CHAINS IN BOTH AEROBIC AND ANAEROBIC  BOTTLES GRAM POSITIVE COCCI IN CLUSTERS AEROBIC BOTTLE ONLY CRITICAL RESULT CALLED TO, READ BACK BY AND VERIFIED WITH: J. LEDFORD,PHARMD [id number] 01/13/2020 T. TYSOR    Culture (A)  Final    PROTEUS MIRABILIS PSEUDOMONAS AERUGINOSA ENTEROCOCCUS FAECALIS ORG 1 AND 2 SUSCEPTIBILITIES PERFORMED ON PREVIOUS CULTURE WITHIN THE LAST 5 DAYS. Performed at [facility], [address] [place], [place] [address]    Report Status 01/17/2020 FINAL  Final   Organism ID, Bacteria ENTEROCOCCUS FAECALIS  Final      Susceptibility   Enterococcus faecalis - MIC*    AMPICILLIN <=2 SENSITIVE Sensitive     VANCOMYCIN 1 SENSITIVE Sensitive     GENTAMICIN SYNERGY SENSITIVE Sensitive     * ENTEROCOCCUS FAECALIS  Blood Culture (routine x 2)     Status: Abnormal   Collection Time: 01/12/20 12:56 PM   Specimen: BLOOD LEFT HAND  Result Value Ref Range Status   Specimen Description BLOOD LEFT HAND  Final   Special Requests   Final    BOTTLES DRAWN AEROBIC AND ANAEROBIC Blood Culture adequate volume   Culture  Setup Time   Final    GRAM NEGATIVE RODS IN BOTH AEROBIC AND ANAEROBIC BOTTLES CRITICAL VALUE NOTED.  VALUE IS CONSISTENT WITH PREVIOUSLY REPORTED AND CALLED VALUE. Performed at [facility], [address] [place], [place] [address]    Culture PSEUDOMONAS AERUGINOSA PROTEUS MIRABILIS  (A)  Final   Report Status 01/16/2020 FINAL  Final   Organism ID, Bacteria PSEUDOMONAS AERUGINOSA  Final   Organism ID, Bacteria PROTEUS MIRABILIS  Final      Susceptibility   Pseudomonas aeruginosa - MIC*    CEFTAZIDIME 4 SENSITIVE Sensitive     CIPROFLOXACIN <=0.25 SENSITIVE Sensitive     GENTAMICIN 8 INTERMEDIATE Intermediate     IMIPENEM 2 SENSITIVE Sensitive     PIP/TAZO <=4 SENSITIVE Sensitive     CEFEPIME 4 SENSITIVE Sensitive     * PSEUDOMONAS  AERUGINOSA   Proteus mirabilis - MIC*    AMPICILLIN 16 INTERMEDIATE Intermediate     CEFAZOLIN 8 SENSITIVE Sensitive     CEFEPIME 0.25 SENSITIVE  Sensitive     CEFTAZIDIME <=1 SENSITIVE Sensitive     CEFTRIAXONE <=0.25 SENSITIVE Sensitive     CIPROFLOXACIN >=4 RESISTANT Resistant     GENTAMICIN <=1 SENSITIVE Sensitive     IMIPENEM >=16 RESISTANT Resistant     TRIMETH/SULFA >=320 RESISTANT Resistant     AMPICILLIN/SULBACTAM 8 SENSITIVE Sensitive     PIP/TAZO <=4 SENSITIVE Sensitive     * PROTEUS MIRABILIS  Urine culture     Status: Abnormal   Collection Time: 01/12/20 12:56 PM   Specimen: In/Out Cath Urine  Result Value Ref Range Status   Specimen Description IN/OUT CATH URINE  Final   Special Requests   Final    NONE Performed at [facility], [address] [place], [place] [address]    Culture (A)  Final    >=100,000 COLONIES/mL PROTEUS MIRABILIS >=100,000 COLONIES/mL PSEUDOMONAS AERUGINOSA    Report Status 01/17/2020 FINAL  Final   Organism ID, Bacteria PROTEUS MIRABILIS (A)  Final   Organism ID, Bacteria PSEUDOMONAS AERUGINOSA (A)  Final      Susceptibility   Pseudomonas aeruginosa - MIC*    CEFTAZIDIME 4 SENSITIVE Sensitive     CIPROFLOXACIN <=0.25 SENSITIVE Sensitive     GENTAMICIN 8 INTERMEDIATE Intermediate     IMIPENEM 1 SENSITIVE Sensitive     PIP/TAZO <=4 SENSITIVE Sensitive     * >=100,000 COLONIES/mL PSEUDOMONAS AERUGINOSA   Proteus mirabilis - MIC*    AMPICILLIN <=2 SENSITIVE Sensitive     CEFAZOLIN <=4 SENSITIVE Sensitive     CEFEPIME <=0.12 SENSITIVE Sensitive     CEFTRIAXONE <=0.25 SENSITIVE Sensitive     CIPROFLOXACIN <=0.25 SENSITIVE Sensitive     GENTAMICIN <=1 SENSITIVE Sensitive     IMIPENEM 2 SENSITIVE Sensitive     NITROFURANTOIN RESISTANT Resistant     TRIMETH/SULFA <=20 SENSITIVE Sensitive     AMPICILLIN/SULBACTAM <=2 SENSITIVE Sensitive     PIP/TAZO <=4 SENSITIVE Sensitive     * >=100,000 COLONIES/mL PROTEUS MIRABILIS  Resp Panel by RT-PCR (Flu A&B, Covid) Nasopharyngeal Swab     Status: None   Collection Time: 01/12/20 12:56 PM   Specimen: Nasopharyngeal Swab;  Nasopharyngeal(NP) swabs in vial transport medium  Result Value Ref Range Status   SARS Coronavirus 2 by RT PCR NEGATIVE NEGATIVE Final    Comment: (NOTE) SARS-CoV-2 target nucleic acids are NOT DETECTED.  The SARS-CoV-2 RNA is generally detectable in upper respiratory specimens during the acute phase of infection. The lowest concentration of SARS-CoV-2 viral copies this assay can detect is 138 copies/mL. A negative result does not preclude SARS-Cov-2 infection and should not be used as the sole basis for treatment or other patient management decisions. A negative result may occur with  improper specimen collection/handling, submission of specimen other than nasopharyngeal swab, presence of viral mutation(s) within the areas targeted by this assay, and inadequate number of viral copies(<138 copies/mL). A negative result must be combined with clinical observations, patient history, and epidemiological information. The expected result is Negative.  Fact Sheet for Patients:  BloggerCourse.com  Fact Sheet for Healthcare Providers:  SeriousBroker.it  This test is no t yet approved or cleared by the [place] FDA and  has been authorized for detection and/or diagnosis of SARS-CoV-2 by FDA under an Emergency Use Authorization (EUA). This EUA will remain  in effect (  meaning this test can be used) for the duration of the COVID-19 declaration under Section 564(b)(1) of the Act, 21 U.S.C.section 360bbb-3(b)(1), unless the authorization is terminated  or revoked sooner.       Influenza A by PCR NEGATIVE NEGATIVE Final   Influenza B by PCR NEGATIVE NEGATIVE Final    Comment: (NOTE) The Xpert Xpress SARS-CoV-2/FLU/RSV plus assay is intended as an aid in the diagnosis of influenza from Nasopharyngeal swab specimens and should not be used as a sole basis for treatment. Nasal washings and aspirates are unacceptable for Xpert Xpress  SARS-CoV-2/FLU/RSV testing.  Fact Sheet for Patients: BloggerCourse.com  Fact Sheet for Healthcare Providers: SeriousBroker.it  This test is not yet approved or cleared by the [place] FDA and has been authorized for detection and/or diagnosis of SARS-CoV-2 by FDA under an Emergency Use Authorization (EUA). This EUA will remain in effect (meaning this test can be used) for the duration of the COVID-19 declaration under Section 564(b)(1) of the Act, 21 U.S.C. section 360bbb-3(b)(1), unless the authorization is terminated or revoked.  Performed at [facility], [address], [place], [place] [address]   Blood Culture ID Panel (Reflexed)     Status: Abnormal   Collection Time: 01/12/20 12:56 PM  Result Value Ref Range Status   Enterococcus faecalis DETECTED (A) NOT DETECTED Final    Comment: CRITICAL RESULT CALLED TO, READ BACK BY AND VERIFIED WITH: J. LEDFORD,PHARMD [id number] 01/13/2020 T. TYSOR    Enterococcus Faecium NOT DETECTED NOT DETECTED Final   Listeria monocytogenes NOT DETECTED NOT DETECTED Final   Staphylococcus species DETECTED (A) NOT DETECTED Final    Comment: CRITICAL RESULT CALLED TO, READ BACK BY AND VERIFIED WITH: J. LEDFORD,PHARMD [id number] 01/13/2020 T. TYSOR    Staphylococcus aureus (BCID) NOT DETECTED NOT DETECTED Final   Staphylococcus epidermidis NOT DETECTED NOT DETECTED Final   Staphylococcus lugdunensis NOT DETECTED NOT DETECTED Final   Streptococcus species NOT DETECTED NOT DETECTED Final   Streptococcus agalactiae NOT DETECTED NOT DETECTED Final   Streptococcus pneumoniae NOT DETECTED NOT DETECTED Final   Streptococcus pyogenes NOT DETECTED NOT DETECTED Final   A.calcoaceticus-baumannii NOT DETECTED NOT DETECTED Final   Bacteroides fragilis NOT DETECTED NOT DETECTED Final   Enterobacterales DETECTED (A) NOT DETECTED Final    Comment: Enterobacterales represent a large order of gram negative  bacteria, not a single organism. CRITICAL RESULT CALLED TO, READ BACK BY AND VERIFIED WITH: J. LEDFORD,PHARMD [id number] 01/13/2020 T. TYSOR    Enterobacter cloacae complex NOT DETECTED NOT DETECTED Final   Escherichia coli NOT DETECTED NOT DETECTED Final   Klebsiella aerogenes NOT DETECTED NOT DETECTED Final   Klebsiella oxytoca NOT DETECTED NOT DETECTED Final   Klebsiella pneumoniae NOT DETECTED NOT DETECTED Final   Proteus species DETECTED (A) NOT DETECTED Final    Comment: CRITICAL RESULT CALLED TO, READ BACK BY AND VERIFIED WITH: J. LEDFORD,PHARMD [id number] 01/13/2020 T. TYSOR    Salmonella species NOT DETECTED NOT DETECTED Final   Serratia marcescens NOT DETECTED NOT DETECTED Final   Haemophilus influenzae NOT DETECTED NOT DETECTED Final   Neisseria meningitidis NOT DETECTED NOT DETECTED Final   Pseudomonas aeruginosa DETECTED (A) NOT DETECTED Final    Comment: CRITICAL RESULT CALLED TO, READ BACK BY AND VERIFIED WITH: J. LEDFORD,PHARMD 1324 01/13/2020 T. TYSOR    Stenotrophomonas maltophilia NOT DETECTED NOT DETECTED Final   Candida albicans NOT DETECTED NOT DETECTED Final   Candida auris NOT DETECTED NOT DETECTED Final   Candida glabrata NOT DETECTED NOT  DETECTED Final   Candida krusei NOT DETECTED NOT DETECTED Final   Candida parapsilosis NOT DETECTED NOT DETECTED Final   Candida tropicalis NOT DETECTED NOT DETECTED Final   Cryptococcus neoformans/gattii NOT DETECTED NOT DETECTED Final   CTX-M ESBL NOT DETECTED NOT DETECTED Final   Carbapenem resistance IMP NOT DETECTED NOT DETECTED Final   Carbapenem resistance KPC NOT DETECTED NOT DETECTED Final   Carbapenem resistance NDM NOT DETECTED NOT DETECTED Final   Carbapenem resist OXA 48 LIKE NOT DETECTED NOT DETECTED Final   Vancomycin resistance NOT DETECTED NOT DETECTED Final   Carbapenem resistance VIM NOT DETECTED NOT DETECTED Final    Comment: Performed at [facility], [address] [place], [place] [address]  MRSA PCR  Screening     Status: None   Collection Time: 01/12/20  9:10 PM   Specimen: Nasal Mucosa; Nasopharyngeal  Result Value Ref Range Status   MRSA by PCR NEGATIVE NEGATIVE Final    Comment:        The GeneXpert MRSA Assay (FDA approved for NASAL specimens only), is one component of a comprehensive MRSA colonization surveillance program. It is not intended to diagnose MRSA infection nor to guide or monitor treatment for MRSA infections. Performed at [facility], [address], [place], [place] [address]   Culture, blood (routine x 2)     Status: None (Preliminary result)   Collection Time: 01/14/20 10:16 AM   Specimen: BLOOD LEFT HAND  Result Value Ref Range Status   Specimen Description BLOOD LEFT HAND  Final   Special Requests   Final    BOTTLES DRAWN AEROBIC AND ANAEROBIC Blood Culture adequate volume   Culture   Final    NO GROWTH 2 DAYS Performed at [facility], [address], [place], [place] [address]    Report Status PENDING  Incomplete  Culture, blood (routine x 2)     Status: None (Preliminary result)   Collection Time: 01/14/20 10:21 AM   Specimen: BLOOD LEFT FOREARM  Result Value Ref Range Status   Specimen Description BLOOD LEFT FOREARM  Final   Special Requests   Final    BOTTLES DRAWN AEROBIC AND ANAEROBIC Blood Culture adequate volume   Culture   Final    NO GROWTH 2 DAYS Performed at [facility], [address] [place], [place] [address]    Report Status PENDING  Incomplete    Procedures and diagnostic studies:  No results found.  Medications:   . Chlorhexidine Gluconate Cloth  6 each Topical Daily  . docusate sodium  100 mg Oral BID  . feeding supplement  237 mL Oral TID WC  . heparin  5,000 Units Subcutaneous Q8H  . insulin aspart  0-9 Units Subcutaneous Q4H  . multivitamin with minerals  1 tablet Oral Daily  . polyethylene glycol  17 g Oral Daily  . polyvinyl alcohol  2 drop Both Eyes Q4H   Continuous Infusions: .  piperacillin-tazobactam (ZOSYN)  IV 3.375 g (01/17/20 0547)     LOS: 5 days   Ky Barban  [facility]   How to contact the [facility] Attending or Consulting provider 7A - 7P or covering provider during after hours 7P -7A, for this patient?  1. Check the care team in [facility] and look for a) attending/consulting [facility] provider listed and b) the [facility] team listed 2. Log into [email or web address] and use [place]'s universal password to access. If you do not have the password,  please contact the hospital operator. 3. Locate the [facility] provider you are looking for under [facility] and page to a number that you can be directly reached. 4. If you still have difficulty reaching the provider, please page the [facility] (Director on Call) for the Hospitalists listed on amion for assistance.  01/17/2020, 3:03 PM

## 2020-01-17 NOTE — Plan of Care (Signed)
Patient is alert and oriented x4. In no acute distress. Problem: Education: Goal: Knowledge of General Education information will improve Description: Including pain rating scale, medication(s)/side effects and non-pharmacologic comfort measures Outcome: Progressing   Problem: Health Behavior/Discharge Planning: Goal: Ability to manage health-related needs will improve Outcome: Progressing   Problem: Clinical Measurements: Goal: Ability to maintain clinical measurements within normal limits will improve Outcome: Progressing Goal: Will remain free from infection Outcome: Progressing Goal: Diagnostic test results will improve Outcome: Progressing Goal: Respiratory complications will improve Outcome: Progressing Goal: Cardiovascular complication will be avoided Outcome: Progressing   Problem: Activity: Goal: Risk for activity intolerance will decrease Outcome: Progressing   Problem: Nutrition: Goal: Adequate nutrition will be maintained Outcome: Progressing   Problem: Coping: Goal: Level of anxiety will decrease Outcome: Progressing   Problem: Elimination: Goal: Will not experience complications related to bowel motility Outcome: Progressing Goal: Will not experience complications related to urinary retention Outcome: Progressing   Problem: Pain Managment: Goal: General experience of comfort will improve Outcome: Progressing   Problem: Safety: Goal: Ability to remain free from injury will improve Outcome: Progressing   Problem: Skin Integrity: Goal: Risk for impaired skin integrity will decrease Outcome: Progressing

## 2020-01-17 NOTE — Progress Notes (Signed)
Occupational Therapy Treatment Patient Details Name: Travis Cantrell MRN: [id number] DOB: Jan 22, 1961 Today's Date: 01/17/2020    History of present illness 59 Y O Male from NH with PMH of MS, GERD, HTN, quadriplegic, bedbound with decubitus ulcer and indwelling foleys catheter who presented to the ED 12/19 with complaints of nausea and vomiting for 2 days. He also had leaking around his Foleys. And complained of some SOB and nonproductive cough during admission. Found to be septic and admitted for treatment of septic shock.   OT comments  Passive stretch performed bil. Elbows with fixed contractures and increased spasticity noted.  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  PROM of wrists and digits performed.  He reports he has resting hand splints as well as elbow splints at his NH, but that the are seldomly applied by nursing home staff.   He did call his daughter to request she bring splints to the hospital to see if they fit well and are still appropriate.  Without daily stretching and aggressive spasticity management, I am doubtful we will be able to prevent further contracture, but will attempt to establish a splint schedule, but he is at high risk for breakdown due to the severity of contractures and spasticity.    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  Transfers Overall transfer level: Needs assistance               General transfer comment: Maximove bed>chair. Total assist to reposition in  chair once seated. Pillows under pt for cushioning and positioned posteriorly for posture and to prevent pt drifting laterally in recliner.    Balance Overall balance assessment: Needs assistance Sitting-balance support: Feet supported;Bilateral upper extremity supported Sitting balance-Leahy Scale: Zero Sitting balance - Comments: pt up to chair for the first time. pt with poor trunk control and pillows required for positioning.                                   ADL either performed or assessed with clinical judgement   ADL                                               Vision       Perception     Praxis      Cognition Arousal/Alertness: Awake/alert Behavior During Therapy: Flat affect;WFL for tasks assessed/performed Overall Cognitive Status: No family/caregiver present to determine baseline cognitive functioning                                 General Comments: pt wtih decresaed awareness of deficits saying"I sold my lift from home, I'm not going to need it I'll ge tup and go". pt has been bedbound for >5 yrs.  Exercises Exercises: General Lower Extremity General Exercises - Lower Extremity Ankle Circles/Pumps: PROM;Both;15 reps;Supine Heel Slides: PROM;Both;15 reps;Supine Hip ABduction/ADduction: PROM;Both;15 reps;Supine Hip Flexion/Marching: PROM;Both;15 reps;Supine (int/ext rotation for hips attempted, pt very tight.) Other Exercises Other Exercises: Passive stretch performed bil. elbows into extension - fixed contractures noted.  PROM of bil. wrist and hands performed. bil. thumbs with fixed contractures   Shoulder Instructions       General Comments Pt reports his wheelchair is at his home, and he has not been in it for some time.  He reports he has resting hand splints and elbow splints at his NH - he called his daughter while I was in the room to request she bring the splints to the hospital so we can see if  they are appropriate.  Pt reports poor carry over of splint application by NH staff and he reports his family only visits occasionally so can't assist regularly with PROM or splint application    Pertinent Vitals/ Pain       Pain Assessment: No/denies pain  Home Living                                          Prior Functioning/Environment              Frequency  Min 1X/week        Progress Toward Goals  OT Goals(current goals can now be found in the care plan section)  Progress towards OT goals: Not progressing toward goals - comment  Acute Rehab OT Goals Patient Stated Goal: wear braces daily for UE's and have LE boots in proper position. OT Goal Formulation: With patient Time For Goal Achievement: 01/30/20 Potential to Achieve Goals: Fair ADL Goals Additional ADL Goal #1: Pt will tolerate bil. hand and elbow splints without increased pain or increased pressure Additional ADL Goal #2: Pt will independently direct staff on wear schedule for splints  Plan Discharge plan remains appropriate    Co-evaluation                 AM-PAC OT "6 Clicks" Daily Activity     Outcome Measure   Help from another person eating meals?: Total Help from another person taking care of personal grooming?: Total Help from another person toileting, which includes using toliet, bedpan, or urinal?: Total Help from another person bathing (including washing, rinsing, drying)?: Total Help from another person to put on and taking off regular upper body clothing?: Total Help from another person to put on and taking off regular lower body clothing?: Total 6 Click Score: 6    End of Session    OT Visit Diagnosis: Muscle weakness (generalized) (M62.81);Other symptoms and signs involving the nervous system (R29.898);Other (comment)   Activity Tolerance Patient tolerated treatment well   Patient Left in bed;with call bell/phone within reach   Nurse Communication Mobility  status        Time: 5053-9767 OT Time Calculation (min): 31 min  Charges: OT General Charges $OT Visit: 1 Visit OT Treatments $Neuromuscular Re-education: 23-37 mins  Eber Jones., OTR/L Acute Rehabilitation Services Pager [phone] Office [phone]    Jeani Hawking M 01/17/2020, 1:20 PM

## 2020-01-18 DIAGNOSIS — N179 Acute kidney failure, unspecified: Secondary | ICD-10-CM | POA: Diagnosis not present

## 2020-01-18 DIAGNOSIS — L89003 Pressure ulcer of unspecified elbow, stage 3: Secondary | ICD-10-CM | POA: Diagnosis not present

## 2020-01-18 DIAGNOSIS — L89152 Pressure ulcer of sacral region, stage 2: Secondary | ICD-10-CM

## 2020-01-18 DIAGNOSIS — E872 Acidosis: Secondary | ICD-10-CM | POA: Diagnosis not present

## 2020-01-18 DIAGNOSIS — J9601 Acute respiratory failure with hypoxia: Secondary | ICD-10-CM

## 2020-01-18 DIAGNOSIS — A419 Sepsis, unspecified organism: Secondary | ICD-10-CM | POA: Diagnosis not present

## 2020-01-18 LAB — RENAL FUNCTION PANEL
Albumin: 2.5 g/dL — ABNORMAL LOW (ref 3.5–5.0)
Anion gap: 12 (ref 5–15)
BUN: 22 mg/dL — ABNORMAL HIGH (ref 6–20)
CO2: 26 mmol/L (ref 22–32)
Calcium: 9.4 mg/dL (ref 8.9–10.3)
Chloride: 106 mmol/L (ref 98–111)
Creatinine, Ser: 1.05 mg/dL (ref 0.61–1.24)
GFR, Estimated: >60 mL/min (ref >60)
Glucose, Bld: 103 mg/dL — ABNORMAL HIGH (ref 70–99)
Phosphorus: 3.8 mg/dL (ref 2.5–4.6)
Potassium: 4.4 mmol/L (ref 3.5–5.1)
Sodium: 144 mmol/L (ref 135–145)

## 2020-01-18 LAB — GLUCOSE, CAPILLARY
Glucose-Capillary: 116 mg/dL — ABNORMAL HIGH (ref 70–99)
Glucose-Capillary: 147 mg/dL — ABNORMAL HIGH (ref 70–99)
Glucose-Capillary: 161 mg/dL — ABNORMAL HIGH (ref 70–99)
Glucose-Capillary: 81 mg/dL (ref 70–99)
Glucose-Capillary: 82 mg/dL (ref 70–99)

## 2020-01-18 LAB — MAGNESIUM: Magnesium: 1.9 mg/dL (ref 1.7–2.4)

## 2020-01-18 NOTE — Plan of Care (Signed)

## 2020-01-18 NOTE — Progress Notes (Signed)
PROGRESS NOTE    Travis Cantrell  QQI:[id number] DOB: [birth year] DOA: 01/12/2020 PCP: Excell Seltzer, MD     Brief Narrative:  59 y.o. WM (nursing home resident)  PMHx quadriplegic with multiple sclerosis, decub ulcer and indwelling chronic Foley causing recurrent UTI   admitted with 2 days of nausea, vomiting septic shock, presumed UTI. Lactate elevated to 7 with AKI, anion gap acidosis and leukocytosis. Foley appears clogged and was replaced in the ED. Patient briefly required vasopressor support.  Tx to [facility] on 12/21. Per ID, patient will require IV Abx until 01/22/20.    Subjective: A/O x4, afebrile overnight. Patient states he knows when he is getting a UTI because he begins to have abdominal pain. Currently no abdominal pain.   Assessment & Plan: Covid vaccination; vaccinated   Active Problems:   Septic shock (HCC)   Pressure injury of skin   Malnutrition of moderate degree   Septic shock due bacteremia and UTI:  Polymicrobial Bacteremia ( Staph species, E faecalis, Pseudomonas aeruginosa, Proteus) Continue IV fluid resuscitation -ID on board; will will need a TEE r -Cardiology consulted -Indwelling Foley was changed in the ED.  -01/15/20 Per ID, will hold off on TEE as the patient continues to improve clinically. Had repeat blood cultures on 12/21, will follow up.  -01/16/20 Patient continues to improve clinically. ID following, recommend changing Abx to Zosyn with total antibiotic treatment duration of 10 days, today is day 5/10.Patient's culture grew Proteus, Pseudomonas, and E faecalis.  -01/17/20 Continue IV Zosyn, end date of 01/22/20 to complete 10 days of antibiotic treatment.   AKI secondary to sepsis, blocked Foley -foley replaced -Renal ultrasound ruled out obstruction Lab Results  Component Value Date   CREATININE 1.05 01/18/2020   CREATININE 1.23 01/17/2020   CREATININE 1.04 01/16/2020   CREATININE 1.20 01/15/2020   CREATININE 1.50 (H) 01/14/2020   -AKI resolving -Strict in and out -1.9 L -01/15/20 continue Foley catheter. -12/26 consult Urology. Patient has multiple stones in bilateral kidneys none of them are obstructing at the moment so do not believe there is anything urgently required however patient definitely needs to be on urology's radar.   Debilitating multiple sclerosis Patient is quadriparetic with minimal movement -from SNF Has decubitus ulcer, unstageable 01/16/20 Will need to return to SNF but patient and his family want a different facility. CM consulted and following.  01/17/20 Does not tolerate baclofen as it causes diarrhea. OT has seen him today, will resume braces for his UE.   Acute respiratory failure -wean to off as tolerated 01/15/20 Resolved, undetermined cause. Per ID, no suspicion for pna.   Stage II sacral decubitus ulcer Pressure Injury 01/12/20 Buttocks Mid Stage 2 -  Partial thickness loss of dermis presenting as a shallow open injury with a red, pink wound bed without slough. (Active)  01/12/20 1700  Location: Buttocks  Location Orientation: Mid  Staging: Stage 2 -  Partial thickness loss of dermis presenting as a shallow open injury with a red, pink wound bed without slough.  Wound Description (Comments):   Present on Admission: Yes     Pressure Injury 01/12/20 Back Right;Upper;Bilateral Stage 2 -  Partial thickness loss of dermis presenting as a shallow open injury with a red, pink wound bed without slough. covered with yeast (Active)  01/12/20 1700  Location: Back  Location Orientation: Right;Upper;Bilateral  Staging: Stage 2 -  Partial thickness loss of dermis presenting as a shallow open injury with a red, pink wound bed without slough.  Wound Description (Comments): covered with yeast  Present on Admission: Yes   Constipation Reports not BM in 5 days. Denies abdominal pain or nausea.  Will start scheduled docusate and Miralax.   Hypomagnesmia -Magnesium goal> 2     DVT  prophylaxis: Subcu heparin Code Status: Full Family Communication:  Status is: Inpatient    Dispo: The patient is from:               Anticipated d/c is to:               Anticipated d/c date is: 12/30              Patient currently unstable      Consultants:    Procedures/Significant Events:  12/19 US renal;Right Kidney:  Renal measurements: 10.2 x 5.4 x 5.6 cm = volume: 162 mL. Several stones seen in the right kidney with the largest measuring 24 mm. No hydronephrosis.  Left Kidney:  Renal measurements: 11 x 6 x 5 cm = volume: 173.5 mL. Contains a 15 mm nonobstructive stone.  Bladder:  The bladder is partially decompressed with a Foley catheter.  I have personally reviewed and interpreted all radiology studies and my findings are as above.  VENTILATOR SETTINGS:    Cultures   Antimicrobials: Anti-infectives (From admission, onward)   Start     Ordered Stop   01/16/20 1400  piperacillin-tazobactam (ZOSYN) IVPB 3.375 g        01/16/20 1146 01/22/20 2359   01/14/20 1000  DAPTOmycin (CUBICIN) 620 mg in sodium chloride 0.9 % IVPB  Status:  Discontinued        01/14/20 0905 01/16/20 1146   01/14/20 0900  vancomycin (VANCOCIN) IVPB 1000 mg/200 mL premix  Status:  Discontinued        01/14/20 0734 01/14/20 0905   01/13/20 1300  ceFEPIme (MAXIPIME) 2 g in sodium chloride 0.9 % 100 mL IVPB  Status:  Discontinued        01/12/20 1445 01/16/20 1146   01/12/20 1442  vancomycin variable dose per unstable renal function (pharmacist dosing)  Status:  Discontinued        01/12/20 1445 01/14/20 0736   01/12/20 1300  ceFEPIme (MAXIPIME) 2 g in sodium chloride 0.9 % 100 mL IVPB        01/12/20 1250 01/12/20 1347   01/12/20 1300  vancomycin (VANCOCIN) IVPB 1000 mg/200 mL premix  Status:  Discontinued        01/12/20 1250 01/12/20 1257   01/12/20 1300  vancomycin (VANCOREADY) IVPB 1750 mg/350 mL        01/12/20 1257 01/12/20 1550       Devices    LINES / TUBES:       Continuous Infusions: . piperacillin-tazobactam (ZOSYN)  IV 3.375 g (01/18/20 0612)     Objective: Vitals:   01/17/20 0902 01/17/20 1615 01/17/20 2035 01/18/20 0400  BP: 113/78 99/76 110/74 122/74  Pulse: 64 (!) 59 77 (!) 57  Resp: Temp: 97.9 F (36.6 C) 98.4 F (36.9 C) 98.9 F (37.2 C) 97.6 F (36.4 C)  TempSrc: Oral Oral Oral Oral  SpO2: 99% 98% 95% 99%  Weight:      Height:        Intake/Output Summary (Last 24 hours) at 01/18/2020 0915 Last data filed at 01/18/2020 0500 Gross per 24 hour  Intake 360 ml  Output 1000 ml  Net -640 ml   Filed Weights   01/13/20 0400 01/14/20  0500 01/15/20 0500  Weight: 74.7 kg 77.5 kg 75.6 kg    Examination:  General: A/O x4, No acute respiratory distress Eyes: negative scleral hemorrhage, negative anisocoria, negative icterus ENT: Negative Runny nose, negative gingival bleeding, Neck:  Negative scars, masses, torticollis, lymphadenopathy, JVD Lungs: Clear to auscultation bilaterally without wheezes or crackles Cardiovascular: Regular rate and rhythm without murmur gallop or rub normal S1 and S2 Abdomen: negative abdominal pain, positive distention, positive soft, bowel sounds, no rebound, no ascites, no appreciable mass Extremities: contractures Skin: Sacral decubitus ulcer Psychiatric:  Negative depression, negative anxiety, negative fatigue, negative mania  Central nervous system:  Cranial nerves II through XII intact, tongue/uvula midline, negative dysarthria, negative expressive aphasia, negative receptive aphasia.  .     Data Reviewed: Care during the described time interval was provided by me .  I have reviewed this patient's available data, including medical history, events of note, physical examination, and all test results as part of my evaluation.  CBC: Recent Labs  Lab 01/12/20 1256 01/12/20 1521 01/13/20 0518 01/14/20 0529 01/15/20 0516  WBC 21.1* 27.4* 33.2* 10.7* 8.4  NEUTROABS  19.3*  --   --   --   --   HGB 15.1 11.7* 11.5* 11.0* 11.6*  HCT 48.4 38.3* 36.5* 36.1* 35.7*  MCV 89.6 93.0 91.0 90.5 89.7  PLT 340 256 287 204 202   Basic Metabolic Panel: Recent Labs  Lab 01/13/20 0518 01/14/20 0529 01/15/20 0124 01/16/20 0138 01/17/20 0310 01/18/20 0209  NA 144 142 142 143 142 144  K 4.0 3.7 3.5 3.2* 4.0 4.4  CL 108 110 107 107 107 106  CO2 24 23 24 23 26 26   GLUCOSE 124* 88 97 136* 105* 103*  BUN 44* 30* 25* 20 20 22*  CREATININE 2.49* 1.50* 1.20 1.04 1.23 1.05  CALCIUM 8.3* 8.0* 8.2* 8.4* 8.6* 9.4  MG 2.2  --   --   --  1.7 1.9  PHOS 2.9  --   --   --   --  3.8   GFR: Estimated Creatinine Clearance: 78.2 mL/min (by C-G formula based on SCr of 1.05 mg/dL). Liver Function Tests: Recent Labs  Lab 01/12/20 1256 01/18/20 0209  AST 24  --   ALT 18  --   ALKPHOS 152*  --   BILITOT 0.8  --   PROT 6.7  --   ALBUMIN 2.6* 2.5*   No results for input(s): LIPASE, AMYLASE in the last 168 hours. No results for input(s): AMMONIA in the last 168 hours. Coagulation Profile: Recent Labs  Lab 01/12/20 1256  INR 1.6*   Cardiac Enzymes: Recent Labs  Lab 01/15/20 0516  CKTOTAL 17*   BNP (last 3 results) No results for input(s): PROBNP in the last 8760 hours. HbA1C: No results for input(s): HGBA1C in the last 72 hours. CBG: Recent Labs  Lab 01/17/20 1219 01/17/20 1613 01/17/20 2038 01/18/20 0014 01/18/20 0408  GLUCAP 188* 75 210* 147* 81   Lipid Profile: No results for input(s): CHOL, HDL, LDLCALC, TRIG, CHOLHDL, LDLDIRECT in the last 72 hours. Thyroid Function Tests: No results for input(s): TSH, T4TOTAL, FREET4, T3FREE, THYROIDAB in the last 72 hours. Anemia Panel: No results for input(s): VITAMINB12, FOLATE, FERRITIN, TIBC, IRON, RETICCTPCT in the last 72 hours. Sepsis Labs: Recent Labs  Lab 01/12/20 1256 01/12/20 1521 01/12/20 1757 01/13/20 0200  LATICACIDVEN 7.1* 5.8* 3.5* 1.9    Recent Results (from the past 240 hour(s))  Blood  Culture (routine x 2)  Status: Abnormal   Collection Time: 01/12/20 12:56 PM   Specimen: BLOOD  Result Value Ref Range Status   Specimen Description BLOOD SITE NOT SPECIFIED  Final   Special Requests   Final    BOTTLES DRAWN AEROBIC AND ANAEROBIC Blood Culture results may not be optimal due to an inadequate volume of blood received in culture bottles   Culture  Setup Time   Final    GRAM NEGATIVE RODS GRAM POSITIVE COCCI IN CHAINS IN BOTH AEROBIC AND ANAEROBIC BOTTLES GRAM POSITIVE COCCI IN CLUSTERS AEROBIC BOTTLE ONLY CRITICAL RESULT CALLED TO, READ BACK BY AND VERIFIED WITH: J. LEDFORD,PHARMD [id number] 01/13/2020 T. TYSOR    Culture (A)  Final    PROTEUS MIRABILIS PSEUDOMONAS AERUGINOSA ENTEROCOCCUS FAECALIS ORG 1 AND 2 SUSCEPTIBILITIES PERFORMED ON PREVIOUS CULTURE WITHIN THE LAST 5 DAYS. Performed at [facility], [address] [place], [place] [address]    Report Status 01/17/2020 FINAL  Final   Organism ID, Bacteria ENTEROCOCCUS FAECALIS  Final      Susceptibility   Enterococcus faecalis - MIC*    AMPICILLIN <=2 SENSITIVE Sensitive     VANCOMYCIN 1 SENSITIVE Sensitive     GENTAMICIN SYNERGY SENSITIVE Sensitive     * ENTEROCOCCUS FAECALIS  Blood Culture (routine x 2)     Status: Abnormal   Collection Time: 01/12/20 12:56 PM   Specimen: BLOOD LEFT HAND  Result Value Ref Range Status   Specimen Description BLOOD LEFT HAND  Final   Special Requests   Final    BOTTLES DRAWN AEROBIC AND ANAEROBIC Blood Culture adequate volume   Culture  Setup Time   Final    GRAM NEGATIVE RODS IN BOTH AEROBIC AND ANAEROBIC BOTTLES CRITICAL VALUE NOTED.  VALUE IS CONSISTENT WITH PREVIOUSLY REPORTED AND CALLED VALUE. Performed at [facility] [place] [facility], [address] [place], [place] [address]    Culture PSEUDOMONAS AERUGINOSA PROTEUS MIRABILIS  (A)  Final   Report Status 01/16/2020 FINAL  Final   Organism ID, Bacteria PSEUDOMONAS AERUGINOSA  Final   Organism ID, Bacteria PROTEUS  MIRABILIS  Final      Susceptibility   Pseudomonas aeruginosa - MIC*    CEFTAZIDIME 4 SENSITIVE Sensitive     CIPROFLOXACIN <=0.25 SENSITIVE Sensitive     GENTAMICIN 8 INTERMEDIATE Intermediate     IMIPENEM 2 SENSITIVE Sensitive     PIP/TAZO <=4 SENSITIVE Sensitive     CEFEPIME 4 SENSITIVE Sensitive     * PSEUDOMONAS AERUGINOSA   Proteus mirabilis - MIC*    AMPICILLIN 16 INTERMEDIATE Intermediate     CEFAZOLIN 8 SENSITIVE Sensitive     CEFEPIME 0.25 SENSITIVE Sensitive     CEFTAZIDIME <=1 SENSITIVE Sensitive     CEFTRIAXONE <=0.25 SENSITIVE Sensitive     CIPROFLOXACIN >=4 RESISTANT Resistant     GENTAMICIN <=1 SENSITIVE Sensitive     IMIPENEM >=16 RESISTANT Resistant     TRIMETH/SULFA >=320 RESISTANT Resistant     AMPICILLIN/SULBACTAM 8 SENSITIVE Sensitive     PIP/TAZO <=4 SENSITIVE Sensitive     * PROTEUS MIRABILIS  Urine culture     Status: Abnormal   Collection Time: 01/12/20 12:56 PM   Specimen: In/Out Cath Urine  Result Value Ref Range Status   Specimen Description IN/OUT CATH URINE  Final   Special Requests   Final    NONE Performed at [facility], [address] [place], [place] [address]    Culture (A)  Final    >=100,000 COLONIES/mL PROTEUS  MIRABILIS >=100,000 COLONIES/mL PSEUDOMONAS AERUGINOSA    Report Status 01/17/2020 FINAL  Final   Organism ID, Bacteria PROTEUS MIRABILIS (A)  Final   Organism ID, Bacteria PSEUDOMONAS AERUGINOSA (A)  Final      Susceptibility   Pseudomonas aeruginosa - MIC*    CEFTAZIDIME 4 SENSITIVE Sensitive     CIPROFLOXACIN <=0.25 SENSITIVE Sensitive     GENTAMICIN 8 INTERMEDIATE Intermediate     IMIPENEM 1 SENSITIVE Sensitive     PIP/TAZO <=4 SENSITIVE Sensitive     * >=100,000 COLONIES/mL PSEUDOMONAS AERUGINOSA   Proteus mirabilis - MIC*    AMPICILLIN <=2 SENSITIVE Sensitive     CEFAZOLIN <=4 SENSITIVE Sensitive     CEFEPIME <=0.12 SENSITIVE Sensitive     CEFTRIAXONE <=0.25 SENSITIVE Sensitive     CIPROFLOXACIN <=0.25  SENSITIVE Sensitive     GENTAMICIN <=1 SENSITIVE Sensitive     IMIPENEM 2 SENSITIVE Sensitive     NITROFURANTOIN RESISTANT Resistant     TRIMETH/SULFA <=20 SENSITIVE Sensitive     AMPICILLIN/SULBACTAM <=2 SENSITIVE Sensitive     PIP/TAZO <=4 SENSITIVE Sensitive     * >=100,000 COLONIES/mL PROTEUS MIRABILIS  Resp Panel by RT-PCR (Flu A&B, Covid) Nasopharyngeal Swab     Status: None   Collection Time: 01/12/20 12:56 PM   Specimen: Nasopharyngeal Swab; Nasopharyngeal(NP) swabs in vial transport medium  Result Value Ref Range Status   SARS Coronavirus 2 by RT PCR NEGATIVE NEGATIVE Final    Comment: (NOTE) SARS-CoV-2 target nucleic acids are NOT DETECTED.  The SARS-CoV-2 RNA is generally detectable in upper respiratory specimens during the acute phase of infection. The lowest concentration of SARS-CoV-2 viral copies this assay can detect is 138 copies/mL. A negative result does not preclude SARS-Cov-2 infection and should not be used as the sole basis for treatment or other patient management decisions. A negative result may occur with  improper specimen collection/handling, submission of specimen other than nasopharyngeal swab, presence of viral mutation(s) within the areas targeted by this assay, and inadequate number of viral copies(<138 copies/mL). A negative result must be combined with clinical observations, patient history, and epidemiological information. The expected result is Negative.  Fact Sheet for Patients:  BloggerCourse.com  Fact Sheet for Healthcare Providers:  SeriousBroker.it  This test is no t yet approved or cleared by the [place] FDA and  has been authorized for detection and/or diagnosis of SARS-CoV-2 by FDA under an Emergency Use Authorization (EUA). This EUA will remain  in effect (meaning this test can be used) for the duration of the COVID-19 declaration under Section 564(b)(1) of the Act,  21 U.S.C.section 360bbb-3(b)(1), unless the authorization is terminated  or revoked sooner.       Influenza A by PCR NEGATIVE NEGATIVE Final   Influenza B by PCR NEGATIVE NEGATIVE Final    Comment: (NOTE) The Xpert Xpress SARS-CoV-2/FLU/RSV plus assay is intended as an aid in the diagnosis of influenza from Nasopharyngeal swab specimens and should not be used as a sole basis for treatment. Nasal washings and aspirates are unacceptable for Xpert Xpress SARS-CoV-2/FLU/RSV testing.  Fact Sheet for Patients: BloggerCourse.com  Fact Sheet for Healthcare Providers: SeriousBroker.it  This test is not yet approved or cleared by the [place] FDA and has been authorized for detection and/or diagnosis of SARS-CoV-2 by FDA under an Emergency Use Authorization (EUA). This EUA will remain in effect (meaning this test can be used) for the duration of the COVID-19 declaration under Section 564(b)(1) of the Act, 21 U.S.C. section 360bbb-3(b)(1), unless  the authorization is terminated or revoked.  Performed at [facility], [address] [place], [place] [address]   Blood Culture ID Panel (Reflexed)     Status: Abnormal   Collection Time: 01/12/20 12:56 PM  Result Value Ref Range Status   Enterococcus faecalis DETECTED (A) NOT DETECTED Final    Comment: CRITICAL RESULT CALLED TO, READ BACK BY AND VERIFIED WITH: J. LEDFORD,PHARMD [id number] 01/13/2020 T. TYSOR    Enterococcus Faecium NOT DETECTED NOT DETECTED Final   Listeria monocytogenes NOT DETECTED NOT DETECTED Final   Staphylococcus species DETECTED (A) NOT DETECTED Final    Comment: CRITICAL RESULT CALLED TO, READ BACK BY AND VERIFIED WITH: J. LEDFORD,PHARMD [id number] 01/13/2020 T. TYSOR    Staphylococcus aureus (BCID) NOT DETECTED NOT DETECTED Final   Staphylococcus epidermidis NOT DETECTED NOT DETECTED Final   Staphylococcus lugdunensis NOT DETECTED NOT DETECTED Final    Streptococcus species NOT DETECTED NOT DETECTED Final   Streptococcus agalactiae NOT DETECTED NOT DETECTED Final   Streptococcus pneumoniae NOT DETECTED NOT DETECTED Final   Streptococcus pyogenes NOT DETECTED NOT DETECTED Final   A.calcoaceticus-baumannii NOT DETECTED NOT DETECTED Final   Bacteroides fragilis NOT DETECTED NOT DETECTED Final   Enterobacterales DETECTED (A) NOT DETECTED Final    Comment: Enterobacterales represent a large order of gram negative bacteria, not a single organism. CRITICAL RESULT CALLED TO, READ BACK BY AND VERIFIED WITH: J. LEDFORD,PHARMD [id number] 01/13/2020 T. TYSOR    Enterobacter cloacae complex NOT DETECTED NOT DETECTED Final   Escherichia coli NOT DETECTED NOT DETECTED Final   Klebsiella aerogenes NOT DETECTED NOT DETECTED Final   Klebsiella oxytoca NOT DETECTED NOT DETECTED Final   Klebsiella pneumoniae NOT DETECTED NOT DETECTED Final   Proteus species DETECTED (A) NOT DETECTED Final    Comment: CRITICAL RESULT CALLED TO, READ BACK BY AND VERIFIED WITH: J. LEDFORD,PHARMD [id number] 01/13/2020 T. TYSOR    Salmonella species NOT DETECTED NOT DETECTED Final   Serratia marcescens NOT DETECTED NOT DETECTED Final   Haemophilus influenzae NOT DETECTED NOT DETECTED Final   Neisseria meningitidis NOT DETECTED NOT DETECTED Final   Pseudomonas aeruginosa DETECTED (A) NOT DETECTED Final    Comment: CRITICAL RESULT CALLED TO, READ BACK BY AND VERIFIED WITH: J. LEDFORD,PHARMD [id number] 01/13/2020 T. TYSOR    Stenotrophomonas maltophilia NOT DETECTED NOT DETECTED Final   Candida albicans NOT DETECTED NOT DETECTED Final   Candida auris NOT DETECTED NOT DETECTED Final   Candida glabrata NOT DETECTED NOT DETECTED Final   Candida krusei NOT DETECTED NOT DETECTED Final   Candida parapsilosis NOT DETECTED NOT DETECTED Final   Candida tropicalis NOT DETECTED NOT DETECTED Final   Cryptococcus neoformans/gattii NOT DETECTED NOT DETECTED Final   CTX-M ESBL NOT DETECTED NOT DETECTED  Final   Carbapenem resistance IMP NOT DETECTED NOT DETECTED Final   Carbapenem resistance KPC NOT DETECTED NOT DETECTED Final   Carbapenem resistance NDM NOT DETECTED NOT DETECTED Final   Carbapenem resist OXA 48 LIKE NOT DETECTED NOT DETECTED Final   Vancomycin resistance NOT DETECTED NOT DETECTED Final   Carbapenem resistance VIM NOT DETECTED NOT DETECTED Final    Comment: Performed at [facility], [address], [place], [place] [address]  MRSA PCR Screening     Status: None   Collection Time: 01/12/20  9:10 PM   Specimen: Nasal Mucosa; Nasopharyngeal  Result Value Ref Range Status   MRSA by PCR NEGATIVE NEGATIVE Final    Comment:        The  GeneXpert MRSA Assay (FDA approved for NASAL specimens only), is one component of a comprehensive MRSA colonization surveillance program. It is not intended to diagnose MRSA infection nor to guide or monitor treatment for MRSA infections. Performed at [facility], [address] [place], [place] [address]   Culture, blood (routine x 2)     Status: None (Preliminary result)   Collection Time: 01/14/20 10:16 AM   Specimen: BLOOD LEFT HAND  Result Value Ref Range Status   Specimen Description BLOOD LEFT HAND  Final   Special Requests   Final    BOTTLES DRAWN AEROBIC AND ANAEROBIC Blood Culture adequate volume   Culture   Final    NO GROWTH 3 DAYS Performed at [facility], [address] [place], [place] [address]    Report Status PENDING  Incomplete  Culture, blood (routine x 2)     Status: None (Preliminary result)   Collection Time: 01/14/20 10:21 AM   Specimen: BLOOD LEFT FOREARM  Result Value Ref Range Status   Specimen Description BLOOD LEFT FOREARM  Final   Special Requests   Final    BOTTLES DRAWN AEROBIC AND ANAEROBIC Blood Culture adequate volume   Culture   Final    NO GROWTH 3 DAYS Performed at [facility], [address] [place], [place] [address]    Report Status PENDING  Incomplete          Radiology Studies: No results found.      Scheduled Meds: . Chlorhexidine Gluconate Cloth  6 each Topical Daily  . docusate sodium  100 mg Oral BID  . feeding supplement  237 mL Oral TID WC  . heparin  5,000 Units Subcutaneous Q8H  . insulin aspart  0-9 Units Subcutaneous Q4H  . multivitamin with minerals  1 tablet Oral Daily  . polyethylene glycol  17 g Oral Daily  . polyvinyl alcohol  2 drop Both Eyes Q4H   Continuous Infusions: . piperacillin-tazobactam (ZOSYN)  IV 3.375 g (01/18/20 0612)     LOS: 6 days    Time spent:40 min    Ronnald Shedden, Roselind Messier, MD [facility] Pager [phone]  If 7PM-7AM, please contact night-coverage [email or web address] Password [email or web address] 01/18/2020, 9:15 AM

## 2020-01-19 DIAGNOSIS — E872 Acidosis: Secondary | ICD-10-CM | POA: Diagnosis not present

## 2020-01-19 DIAGNOSIS — L89003 Pressure ulcer of unspecified elbow, stage 3: Secondary | ICD-10-CM | POA: Diagnosis not present

## 2020-01-19 DIAGNOSIS — A419 Sepsis, unspecified organism: Secondary | ICD-10-CM | POA: Diagnosis not present

## 2020-01-19 DIAGNOSIS — N179 Acute kidney failure, unspecified: Secondary | ICD-10-CM | POA: Diagnosis not present

## 2020-01-19 LAB — GLUCOSE, CAPILLARY
Glucose-Capillary: 104 mg/dL — ABNORMAL HIGH (ref 70–99)
Glucose-Capillary: 129 mg/dL — ABNORMAL HIGH (ref 70–99)
Glucose-Capillary: 133 mg/dL — ABNORMAL HIGH (ref 70–99)
Glucose-Capillary: 141 mg/dL — ABNORMAL HIGH (ref 70–99)
Glucose-Capillary: 143 mg/dL — ABNORMAL HIGH (ref 70–99)
Glucose-Capillary: 152 mg/dL — ABNORMAL HIGH (ref 70–99)
Glucose-Capillary: 94 mg/dL (ref 70–99)

## 2020-01-19 LAB — COMPREHENSIVE METABOLIC PANEL
ALT: 55 U/L — ABNORMAL HIGH (ref 0–44)
AST: 45 U/L — ABNORMAL HIGH (ref 15–41)
Albumin: 2.4 g/dL — ABNORMAL LOW (ref 3.5–5.0)
Alkaline Phosphatase: 58 U/L (ref 38–126)
Anion gap: 7 (ref 5–15)
BUN: 22 mg/dL — ABNORMAL HIGH (ref 6–20)
CO2: 26 mmol/L (ref 22–32)
Calcium: 8.8 mg/dL — ABNORMAL LOW (ref 8.9–10.3)
Chloride: 105 mmol/L (ref 98–111)
Creatinine, Ser: 1.16 mg/dL (ref 0.61–1.24)
GFR, Estimated: >60 mL/min (ref >60)
Glucose, Bld: 131 mg/dL — ABNORMAL HIGH (ref 70–99)
Potassium: 4.1 mmol/L (ref 3.5–5.1)
Sodium: 138 mmol/L (ref 135–145)
Total Bilirubin: 0.6 mg/dL (ref 0.3–1.2)
Total Protein: 5.9 g/dL — ABNORMAL LOW (ref 6.5–8.1)

## 2020-01-19 LAB — CBC WITH DIFFERENTIAL/PLATELET
Abs Immature Granulocytes: 0.66 10*3/uL — ABNORMAL HIGH (ref 0.00–0.07)
Basophils Absolute: 0.1 10*3/uL (ref 0.0–0.1)
Basophils Relative: 1 %
Eosinophils Absolute: 0.9 10*3/uL — ABNORMAL HIGH (ref 0.0–0.5)
Eosinophils Relative: 7 %
HCT: 35.8 % — ABNORMAL LOW (ref 39.0–52.0)
Hemoglobin: 11 g/dL — ABNORMAL LOW (ref 13.0–17.0)
Immature Granulocytes: 5 %
Lymphocytes Relative: 33 %
Lymphs Abs: 4.1 10*3/uL — ABNORMAL HIGH (ref 0.7–4.0)
MCH: 28.1 pg (ref 26.0–34.0)
MCHC: 30.7 g/dL (ref 30.0–36.0)
MCV: 91.3 fL (ref 80.0–100.0)
Monocytes Absolute: 1.3 10*3/uL — ABNORMAL HIGH (ref 0.1–1.0)
Monocytes Relative: 11 %
Neutro Abs: 5.5 10*3/uL (ref 1.7–7.7)
Neutrophils Relative %: 43 %
Platelets: 308 10*3/uL (ref 150–400)
RBC: 3.92 MIL/uL — ABNORMAL LOW (ref 4.22–5.81)
RDW: 14.4 % (ref 11.5–15.5)
WBC: 12.5 10*3/uL — ABNORMAL HIGH (ref 4.0–10.5)
nRBC: 0 % (ref 0.0–0.2)

## 2020-01-19 LAB — CULTURE, BLOOD (ROUTINE X 2)
Culture: NO GROWTH
Culture: NO GROWTH
Special Requests: ADEQUATE
Special Requests: ADEQUATE

## 2020-01-19 LAB — PHOSPHORUS: Phosphorus: 3 mg/dL (ref 2.5–4.6)

## 2020-01-19 LAB — MAGNESIUM: Magnesium: 1.8 mg/dL (ref 1.7–2.4)

## 2020-01-19 NOTE — Plan of Care (Signed)
Alert and oriented x4. Patient in no acute distress.  Problem: Education: Goal: Knowledge of General Education information will improve Description: Including pain rating scale, medication(s)/side effects and non-pharmacologic comfort measures Outcome: Progressing   Problem: Health Behavior/Discharge Planning: Goal: Ability to manage health-related needs will improve Outcome: Progressing   Problem: Clinical Measurements: Goal: Ability to maintain clinical measurements within normal limits will improve Outcome: Progressing Goal: Will remain free from infection Outcome: Progressing Goal: Diagnostic test results will improve Outcome: Progressing Goal: Respiratory complications will improve Outcome: Progressing Goal: Cardiovascular complication will be avoided Outcome: Progressing   Problem: Activity: Goal: Risk for activity intolerance will decrease Outcome: Progressing   Problem: Nutrition: Goal: Adequate nutrition will be maintained Outcome: Progressing   Problem: Coping: Goal: Level of anxiety will decrease Outcome: Progressing   Problem: Elimination: Goal: Will not experience complications related to bowel motility Outcome: Progressing Goal: Will not experience complications related to urinary retention Outcome: Progressing   Problem: Pain Managment: Goal: General experience of comfort will improve Outcome: Progressing   Problem: Safety: Goal: Ability to remain free from injury will improve Outcome: Progressing   Problem: Skin Integrity: Goal: Risk for impaired skin integrity will decrease Outcome: Progressing

## 2020-01-19 NOTE — Progress Notes (Signed)
PROGRESS NOTE    Travis Cantrell  HWY:[id number] DOB: February 18, 1960 DOA: 01/12/2020 PCP: Excell Seltzer, MD     Brief Narrative:  59 y.o. WM (nursing home resident)  PMHx quadriplegic with multiple sclerosis, decub ulcer and indwelling chronic Foley causing recurrent UTI   admitted with 2 days of nausea, vomiting septic shock, presumed UTI. Lactate elevated to 7 with AKI, anion gap acidosis and leukocytosis. Foley appears clogged and was replaced in the ED. Patient briefly required vasopressor support.  Tx to [facility] on 12/21. Per ID, patient will require IV Abx until 01/22/20.    Subjective: 12/26 afebrile overnight A/O x4, afebrile overnight.  Negative suprapubic pain   Assessment & Plan: Covid vaccination; vaccinated   Active Problems:   Septic shock (HCC)   Pressure injury of skin   Malnutrition of moderate degree   Septic shock due bacteremia and UTI:  Polymicrobial Bacteremia ( Staph species, E faecalis, Pseudomonas aeruginosa, Proteus) Continue IV fluid resuscitation -ID on board; will will need a TEE r -Cardiology consulted -Indwelling Foley was changed in the ED.  -01/15/20 Per ID, will hold off on TEE as the patient continues to improve clinically. Had repeat blood cultures on 12/21, will follow up.  -01/16/20 Patient continues to improve clinically. ID following, recommend changing Abx to Zosyn with total antibiotic treatment duration of 10 days, today is day 5/10.Patient's culture grew Proteus, Pseudomonas, and E faecalis.  -01/17/20 Continue IV Zosyn, end date of 01/22/20 to complete 10 days of antibiotic treatment.  -12/27 obtain repeat blood cultures and urine cultures.  AKI secondary to sepsis, blocked Foley -foley replaced -Renal ultrasound ruled out obstruction Lab Results  Component Value Date   CREATININE 1.16 01/19/2020   CREATININE 1.05 01/18/2020   CREATININE 1.23 01/17/2020   CREATININE 1.04 01/16/2020   CREATININE 1.20 01/15/2020  -AKI  resolving -Strict in and out -1.9 L -01/15/20 continue Foley catheter. -12/27 consult Urology. Patient has multiple stones in bilateral kidneys none of them are obstructing at the moment so do not believe there is anything urgently required however patient definitely needs to be on urology's radar.   Debilitating multiple sclerosis Patient is quadriparetic with minimal movement -from SNF Has decubitus ulcer, unstageable 01/16/20 Will need to return to SNF but patient and his family want a different facility. CM consulted and following.  01/17/20 Does not tolerate baclofen as it causes diarrhea. OT has seen him today, will resume braces for his UE.   Acute respiratory failure -wean to off as tolerated 01/15/20 Resolved, undetermined cause. Per ID, no suspicion for pna.   Stage II sacral decubitus ulcer Pressure Injury 01/12/20 Buttocks Mid Stage 2 -  Partial thickness loss of dermis presenting as a shallow open injury with a red, pink wound bed without slough. (Active)  01/12/20 1700  Location: Buttocks  Location Orientation: Mid  Staging: Stage 2 -  Partial thickness loss of dermis presenting as a shallow open injury with a red, pink wound bed without slough.  Wound Description (Comments):   Present on Admission: Yes     Pressure Injury 01/12/20 Back Right;Upper;Bilateral Stage 2 -  Partial thickness loss of dermis presenting as a shallow open injury with a red, pink wound bed without slough. covered with yeast (Active)  01/12/20 1700  Location: Back  Location Orientation: Right;Upper;Bilateral  Staging: Stage 2 -  Partial thickness loss of dermis presenting as a shallow open injury with a red, pink wound bed without slough.  Wound Description (Comments): covered with yeast  Present on Admission: Yes   Constipation Reports not BM in 5 days. Denies abdominal pain or nausea.  Will start scheduled docusate and Miralax.   Hypomagnesmia -Magnesium goal> 2     DVT  prophylaxis: Subcu heparin Code Status: Full Family Communication:  Status is: Inpatient    Dispo: The patient is from:               Anticipated d/c is to:               Anticipated d/c date is: 12/30              Patient currently unstable      Consultants:    Procedures/Significant Events:  12/19 US renal;Right Kidney:  Renal measurements: 10.2 x 5.4 x 5.6 cm = volume: 162 mL. Several stones seen in the right kidney with the largest measuring 24 mm. No hydronephrosis.  Left Kidney:  Renal measurements: 11 x 6 x 5 cm = volume: 173.5 mL. Contains a 15 mm nonobstructive stone.  Bladder:  The bladder is partially decompressed with a Foley catheter.  I have personally reviewed and interpreted all radiology studies and my findings are as above.  VENTILATOR SETTINGS:    Cultures   Antimicrobials: Anti-infectives (From admission, onward)   Start     Ordered Stop   01/16/20 1400  piperacillin-tazobactam (ZOSYN) IVPB 3.375 g        01/16/20 1146 01/22/20 2359   01/14/20 1000  DAPTOmycin (CUBICIN) 620 mg in sodium chloride 0.9 % IVPB  Status:  Discontinued        01/14/20 0905 01/16/20 1146   01/14/20 0900  vancomycin (VANCOCIN) IVPB 1000 mg/200 mL premix  Status:  Discontinued        01/14/20 0734 01/14/20 0905   01/13/20 1300  ceFEPIme (MAXIPIME) 2 g in sodium chloride 0.9 % 100 mL IVPB  Status:  Discontinued        01/12/20 1445 01/16/20 1146   01/12/20 1442  vancomycin variable dose per unstable renal function (pharmacist dosing)  Status:  Discontinued        01/12/20 1445 01/14/20 0736   01/12/20 1300  ceFEPIme (MAXIPIME) 2 g in sodium chloride 0.9 % 100 mL IVPB        01/12/20 1250 01/12/20 1347   01/12/20 1300  vancomycin (VANCOCIN) IVPB 1000 mg/200 mL premix  Status:  Discontinued        01/12/20 1250 01/12/20 1257   01/12/20 1300  vancomycin (VANCOREADY) IVPB 1750 mg/350 mL        01/12/20 1257 01/12/20 1550       Devices    LINES / TUBES:       Continuous Infusions:  piperacillin-tazobactam (ZOSYN)  IV 3.375 g (01/19/20 0532)     Objective: Vitals:   01/18/20 2004 01/19/20 0435 01/19/20 0500 01/19/20 0814  BP: 107/68 108/74  122/72  Pulse: 60 (!) 59  (!) 56  Resp: 16 14  16   Temp: 98.5 F (36.9 C) 98 F (36.7 C)  98.9 F (37.2 C)  TempSrc: Oral Oral  Oral  SpO2: 98% 97%  98%  Weight:   76.1 kg   Height:        Intake/Output Summary (Last 24 hours) at 01/19/2020 0834 Last data filed at 01/19/2020 0700 Gross per 24 hour  Intake 629.23 ml  Output 1100 ml  Net -470.77 ml   Filed Weights   01/14/20 0500 01/15/20 0500 01/19/20 0500  Weight: 77.5 kg 75.6 kg  76.1 kg    Examination:  General: A/O x4, No acute respiratory distress Eyes: negative scleral hemorrhage, negative anisocoria, negative icterus ENT: Negative Runny nose, negative gingival bleeding, Neck:  Negative scars, masses, torticollis, lymphadenopathy, JVD Lungs: Clear to auscultation bilaterally without wheezes or crackles Cardiovascular: Regular rate and rhythm without murmur gallop or rub normal S1 and S2 Abdomen: negative abdominal pain, positive distention, positive soft, bowel sounds, no rebound, no ascites, no appreciable mass Extremities: contractures Skin: Sacral decubitus ulcer Psychiatric:  Negative depression, negative anxiety, negative fatigue, negative mania  Central nervous system:  Cranial nerves II through XII intact, tongue/uvula midline, negative dysarthria, negative expressive aphasia, negative receptive aphasia.  .     Data Reviewed: Care during the described time interval was provided by me .  I have reviewed this patient's available data, including medical history, events of note, physical examination, and all test results as part of my evaluation.  CBC: Recent Labs  Lab 01/12/20 1256 01/12/20 1521 01/13/20 0518 01/14/20 0529 01/15/20 0516 01/19/20 0115  WBC 21.1* 27.4* 33.2* 10.7* 8.4 12.5*  NEUTROABS 19.3*   --   --   --   --  5.5  HGB 15.1 11.7* 11.5* 11.0* 11.6* 11.0*  HCT 48.4 38.3* 36.5* 36.1* 35.7* 35.8*  MCV 89.6 93.0 91.0 90.5 89.7 91.3  PLT 340 256 287 204 202 308   Basic Metabolic Panel: Recent Labs  Lab 01/13/20 0518 01/14/20 0529 01/15/20 0124 01/16/20 0138 01/17/20 0310 01/18/20 0209 01/19/20 0115  NA 144   < > 142 143 142 144 138  K 4.0   < > 3.5 3.2* 4.0 4.4 4.1  CL 108   < > 107 107 107 106 105  CO2 24   < > 24 23 26 26 26   GLUCOSE 124*   < > 97 136* 105* 103* 131*  BUN 44*   < > 25* 20 20 22* 22*  CREATININE 2.49*   < > 1.20 1.04 1.23 1.05 1.16  CALCIUM 8.3*   < > 8.2* 8.4* 8.6* 9.4 8.8*  MG 2.2  --   --   --  1.7 1.9 1.8  PHOS 2.9  --   --   --   --  3.8 3.0   < > = values in this interval not displayed.   GFR: Estimated Creatinine Clearance: 70.8 mL/min (by C-G formula based on SCr of 1.16 mg/dL). Liver Function Tests: Recent Labs  Lab 01/12/20 1256 01/18/20 0209 01/19/20 0115  AST 24  --  45*  ALT 18  --  55*  ALKPHOS 152*  --  58  BILITOT 0.8  --  0.6  PROT 6.7  --  5.9*  ALBUMIN 2.6* 2.5* 2.4*   No results for input(s): LIPASE, AMYLASE in the last 168 hours. No results for input(s): AMMONIA in the last 168 hours. Coagulation Profile: Recent Labs  Lab 01/12/20 1256  INR 1.6*   Cardiac Enzymes: Recent Labs  Lab 01/15/20 0516  CKTOTAL 17*   BNP (last 3 results) No results for input(s): PROBNP in the last 8760 hours. HbA1C: No results for input(s): HGBA1C in the last 72 hours. CBG: Recent Labs  Lab 01/18/20 2006 01/19/20 0013 01/19/20 0036 01/19/20 0434 01/19/20 0805  GLUCAP 116* 129* 143* 104* 94   Lipid Profile: No results for input(s): CHOL, HDL, LDLCALC, TRIG, CHOLHDL, LDLDIRECT in the last 72 hours. Thyroid Function Tests: No results for input(s): TSH, T4TOTAL, FREET4, T3FREE, THYROIDAB in the last 72 hours. Anemia Panel: No  results for input(s): VITAMINB12, FOLATE, FERRITIN, TIBC, IRON, RETICCTPCT in the last 72  hours. Sepsis Labs: Recent Labs  Lab 01/12/20 1256 01/12/20 1521 01/12/20 1757 01/13/20 0200  LATICACIDVEN 7.1* 5.8* 3.5* 1.9    Recent Results (from the past 240 hour(s))  Blood Culture (routine x 2)     Status: Abnormal   Collection Time: 01/12/20 12:56 PM   Specimen: BLOOD  Result Value Ref Range Status   Specimen Description BLOOD SITE NOT SPECIFIED  Final   Special Requests   Final    BOTTLES DRAWN AEROBIC AND ANAEROBIC Blood Culture results may not be optimal due to an inadequate volume of blood received in culture bottles   Culture  Setup Time   Final    GRAM NEGATIVE RODS GRAM POSITIVE COCCI IN CHAINS IN BOTH AEROBIC AND ANAEROBIC BOTTLES GRAM POSITIVE COCCI IN CLUSTERS AEROBIC BOTTLE ONLY CRITICAL RESULT CALLED TO, READ BACK BY AND VERIFIED WITH: J. LEDFORD,PHARMD [id number] 01/13/2020 T. TYSOR    Culture (A)  Final    PROTEUS MIRABILIS PSEUDOMONAS AERUGINOSA ENTEROCOCCUS FAECALIS ORG 1 AND 2 SUSCEPTIBILITIES PERFORMED ON PREVIOUS CULTURE WITHIN THE LAST 5 DAYS. Performed at [facility], [address] [place], [place] [address]    Report Status 01/17/2020 FINAL  Final   Organism ID, Bacteria ENTEROCOCCUS FAECALIS  Final      Susceptibility   Enterococcus faecalis - MIC*    AMPICILLIN <=2 SENSITIVE Sensitive     VANCOMYCIN 1 SENSITIVE Sensitive     GENTAMICIN SYNERGY SENSITIVE Sensitive     * ENTEROCOCCUS FAECALIS  Blood Culture (routine x 2)     Status: Abnormal   Collection Time: 01/12/20 12:56 PM   Specimen: BLOOD LEFT HAND  Result Value Ref Range Status   Specimen Description BLOOD LEFT HAND  Final   Special Requests   Final    BOTTLES DRAWN AEROBIC AND ANAEROBIC Blood Culture adequate volume   Culture  Setup Time   Final    GRAM NEGATIVE RODS IN BOTH AEROBIC AND ANAEROBIC BOTTLES CRITICAL VALUE NOTED.  VALUE IS CONSISTENT WITH PREVIOUSLY REPORTED AND CALLED VALUE. Performed at [facility], [address] [place], [place] [address]     Culture PSEUDOMONAS AERUGINOSA PROTEUS MIRABILIS  (A)  Final   Report Status 01/16/2020 FINAL  Final   Organism ID, Bacteria PSEUDOMONAS AERUGINOSA  Final   Organism ID, Bacteria PROTEUS MIRABILIS  Final      Susceptibility   Pseudomonas aeruginosa - MIC*    CEFTAZIDIME 4 SENSITIVE Sensitive     CIPROFLOXACIN <=0.25 SENSITIVE Sensitive     GENTAMICIN 8 INTERMEDIATE Intermediate     IMIPENEM 2 SENSITIVE Sensitive     PIP/TAZO <=4 SENSITIVE Sensitive     CEFEPIME 4 SENSITIVE Sensitive     * PSEUDOMONAS AERUGINOSA   Proteus mirabilis - MIC*    AMPICILLIN 16 INTERMEDIATE Intermediate     CEFAZOLIN 8 SENSITIVE Sensitive     CEFEPIME 0.25 SENSITIVE Sensitive     CEFTAZIDIME <=1 SENSITIVE Sensitive     CEFTRIAXONE <=0.25 SENSITIVE Sensitive     CIPROFLOXACIN >=4 RESISTANT Resistant     GENTAMICIN <=1 SENSITIVE Sensitive     IMIPENEM >=16 RESISTANT Resistant     TRIMETH/SULFA >=320 RESISTANT Resistant     AMPICILLIN/SULBACTAM 8 SENSITIVE Sensitive     PIP/TAZO <=4 SENSITIVE Sensitive     * PROTEUS MIRABILIS  Urine culture     Status: Abnormal   Collection Time: 01/12/20 12:56 PM   Specimen:  In/Out Cath Urine  Result Value Ref Range Status   Specimen Description IN/OUT CATH URINE  Final   Special Requests   Final    NONE Performed at [facility], [address], [place], [place] [address]    Culture (A)  Final    >=100,000 COLONIES/mL PROTEUS MIRABILIS >=100,000 COLONIES/mL PSEUDOMONAS AERUGINOSA    Report Status 01/17/2020 FINAL  Final   Organism ID, Bacteria PROTEUS MIRABILIS (A)  Final   Organism ID, Bacteria PSEUDOMONAS AERUGINOSA (A)  Final      Susceptibility   Pseudomonas aeruginosa - MIC*    CEFTAZIDIME 4 SENSITIVE Sensitive     CIPROFLOXACIN <=0.25 SENSITIVE Sensitive     GENTAMICIN 8 INTERMEDIATE Intermediate     IMIPENEM 1 SENSITIVE Sensitive     PIP/TAZO <=4 SENSITIVE Sensitive     * >=100,000 COLONIES/mL PSEUDOMONAS AERUGINOSA   Proteus mirabilis - MIC*     AMPICILLIN <=2 SENSITIVE Sensitive     CEFAZOLIN <=4 SENSITIVE Sensitive     CEFEPIME <=0.12 SENSITIVE Sensitive     CEFTRIAXONE <=0.25 SENSITIVE Sensitive     CIPROFLOXACIN <=0.25 SENSITIVE Sensitive     GENTAMICIN <=1 SENSITIVE Sensitive     IMIPENEM 2 SENSITIVE Sensitive     NITROFURANTOIN RESISTANT Resistant     TRIMETH/SULFA <=20 SENSITIVE Sensitive     AMPICILLIN/SULBACTAM <=2 SENSITIVE Sensitive     PIP/TAZO <=4 SENSITIVE Sensitive     * >=100,000 COLONIES/mL PROTEUS MIRABILIS  Resp Panel by RT-PCR (Flu A&B, Covid) Nasopharyngeal Swab     Status: None   Collection Time: 01/12/20 12:56 PM   Specimen: Nasopharyngeal Swab; Nasopharyngeal(NP) swabs in vial transport medium  Result Value Ref Range Status   SARS Coronavirus 2 by RT PCR NEGATIVE NEGATIVE Final    Comment: (NOTE) SARS-CoV-2 target nucleic acids are NOT DETECTED.  The SARS-CoV-2 RNA is generally detectable in upper respiratory specimens during the acute phase of infection. The lowest concentration of SARS-CoV-2 viral copies this assay can detect is 138 copies/mL. A negative result does not preclude SARS-Cov-2 infection and should not be used as the sole basis for treatment or other patient management decisions. A negative result may occur with  improper specimen collection/handling, submission of specimen other than nasopharyngeal swab, presence of viral mutation(s) within the areas targeted by this assay, and inadequate number of viral copies(<138 copies/mL). A negative result must be combined with clinical observations, patient history, and epidemiological information. The expected result is Negative.  Fact Sheet for Patients:  BloggerCourse.com  Fact Sheet for Healthcare Providers:  SeriousBroker.it  This test is no t yet approved or cleared by the [place] FDA and  has been authorized for detection and/or diagnosis of SARS-CoV-2 by FDA under an  Emergency Use Authorization (EUA). This EUA will remain  in effect (meaning this test can be used) for the duration of the COVID-19 declaration under Section 564(b)(1) of the Act, 21 U.S.C.section 360bbb-3(b)(1), unless the authorization is terminated  or revoked sooner.       Influenza A by PCR NEGATIVE NEGATIVE Final   Influenza B by PCR NEGATIVE NEGATIVE Final    Comment: (NOTE) The Xpert Xpress SARS-CoV-2/FLU/RSV plus assay is intended as an aid in the diagnosis of influenza from Nasopharyngeal swab specimens and should not be used as a sole basis for treatment. Nasal washings and aspirates are unacceptable for Xpert Xpress SARS-CoV-2/FLU/RSV testing.  Fact Sheet for Patients: BloggerCourse.com  Fact Sheet for Healthcare Providers: SeriousBroker.it  This test is not yet approved  or cleared by the [place] and has been authorized for detection and/or diagnosis of SARS-CoV-2 by FDA under an Emergency Use Authorization (EUA). This EUA will remain in effect (meaning this test can be used) for the duration of the COVID-19 declaration under Section 564(b)(1) of the Act, 21 U.S.C. section 360bbb-3(b)(1), unless the authorization is terminated or revoked.  Performed at [facility], [address] [place], [place] [address]   Blood Culture ID Panel (Reflexed)     Status: Abnormal   Collection Time: 01/12/20 12:56 PM  Result Value Ref Range Status   Enterococcus faecalis DETECTED (A) NOT DETECTED Final    Comment: CRITICAL RESULT CALLED TO, READ BACK BY AND VERIFIED WITH: J. LEDFORD,PHARMD [id number] 01/13/2020 T. TYSOR    Enterococcus Faecium NOT DETECTED NOT DETECTED Final   Listeria monocytogenes NOT DETECTED NOT DETECTED Final   Staphylococcus species DETECTED (A) NOT DETECTED Final    Comment: CRITICAL RESULT CALLED TO, READ BACK BY AND VERIFIED WITH: J. LEDFORD,PHARMD [id number] 01/13/2020 T. TYSOR    Staphylococcus  aureus (BCID) NOT DETECTED NOT DETECTED Final   Staphylococcus epidermidis NOT DETECTED NOT DETECTED Final   Staphylococcus lugdunensis NOT DETECTED NOT DETECTED Final   Streptococcus species NOT DETECTED NOT DETECTED Final   Streptococcus agalactiae NOT DETECTED NOT DETECTED Final   Streptococcus pneumoniae NOT DETECTED NOT DETECTED Final   Streptococcus pyogenes NOT DETECTED NOT DETECTED Final   A.calcoaceticus-baumannii NOT DETECTED NOT DETECTED Final   Bacteroides fragilis NOT DETECTED NOT DETECTED Final   Enterobacterales DETECTED (A) NOT DETECTED Final    Comment: Enterobacterales represent a large order of gram negative bacteria, not a single organism. CRITICAL RESULT CALLED TO, READ BACK BY AND VERIFIED WITH: J. LEDFORD,PHARMD [id number] 01/13/2020 T. TYSOR    Enterobacter cloacae complex NOT DETECTED NOT DETECTED Final   Escherichia coli NOT DETECTED NOT DETECTED Final   Klebsiella aerogenes NOT DETECTED NOT DETECTED Final   Klebsiella oxytoca NOT DETECTED NOT DETECTED Final   Klebsiella pneumoniae NOT DETECTED NOT DETECTED Final   Proteus species DETECTED (A) NOT DETECTED Final    Comment: CRITICAL RESULT CALLED TO, READ BACK BY AND VERIFIED WITH: J. LEDFORD,PHARMD [id number] 01/13/2020 T. TYSOR    Salmonella species NOT DETECTED NOT DETECTED Final   Serratia marcescens NOT DETECTED NOT DETECTED Final   Haemophilus influenzae NOT DETECTED NOT DETECTED Final   Neisseria meningitidis NOT DETECTED NOT DETECTED Final   Pseudomonas aeruginosa DETECTED (A) NOT DETECTED Final    Comment: CRITICAL RESULT CALLED TO, READ BACK BY AND VERIFIED WITH: J. LEDFORD,PHARMD [id number] 01/13/2020 T. TYSOR    Stenotrophomonas maltophilia NOT DETECTED NOT DETECTED Final   Candida albicans NOT DETECTED NOT DETECTED Final   Candida auris NOT DETECTED NOT DETECTED Final   Candida glabrata NOT DETECTED NOT DETECTED Final   Candida krusei NOT DETECTED NOT DETECTED Final   Candida parapsilosis NOT DETECTED NOT  DETECTED Final   Candida tropicalis NOT DETECTED NOT DETECTED Final   Cryptococcus neoformans/gattii NOT DETECTED NOT DETECTED Final   CTX-M ESBL NOT DETECTED NOT DETECTED Final   Carbapenem resistance IMP NOT DETECTED NOT DETECTED Final   Carbapenem resistance KPC NOT DETECTED NOT DETECTED Final   Carbapenem resistance NDM NOT DETECTED NOT DETECTED Final   Carbapenem resist OXA 48 LIKE NOT DETECTED NOT DETECTED Final   Vancomycin resistance NOT DETECTED NOT DETECTED Final   Carbapenem resistance VIM NOT DETECTED NOT DETECTED Final    Comment: Performed at [facility], [address] [place], [place] [address]  MRSA PCR Screening     Status: None   Collection Time: 01/12/20  9:10 PM   Specimen: Nasal Mucosa; Nasopharyngeal  Result Value Ref Range Status   MRSA by PCR NEGATIVE NEGATIVE Final    Comment:        The GeneXpert MRSA Assay (FDA approved for NASAL specimens only), is one component of a comprehensive MRSA colonization surveillance program. It is not intended to diagnose MRSA infection nor to guide or monitor treatment for MRSA infections. Performed at [facility], [address] [place], [place] [address]   Culture, blood (routine x 2)     Status: None (Preliminary result)   Collection Time: 01/14/20 10:16 AM   Specimen: BLOOD LEFT HAND  Result Value Ref Range Status   Specimen Description BLOOD LEFT HAND  Final   Special Requests   Final    BOTTLES DRAWN AEROBIC AND ANAEROBIC Blood Culture adequate volume   Culture   Final    NO GROWTH 4 DAYS Performed at [facility], [address] [place], [place] [address]    Report Status PENDING  Incomplete  Culture, blood (routine x 2)     Status: None (Preliminary result)   Collection Time: 01/14/20 10:21 AM   Specimen: BLOOD LEFT FOREARM  Result Value Ref Range Status   Specimen Description BLOOD LEFT FOREARM  Final   Special Requests   Final    BOTTLES DRAWN AEROBIC AND ANAEROBIC Blood Culture  adequate volume   Culture   Final    NO GROWTH 4 DAYS Performed at [facility], [address] [place], [place] [address]    Report Status PENDING  Incomplete         Radiology Studies: No results found.      Scheduled Meds:  Chlorhexidine Gluconate Cloth  6 each Topical Daily   docusate sodium  100 mg Oral BID   feeding supplement  237 mL Oral TID WC   heparin  5,000 Units Subcutaneous Q8H   insulin aspart  0-9 Units Subcutaneous Q4H   multivitamin with minerals  1 tablet Oral Daily   polyethylene glycol  17 g Oral Daily   polyvinyl alcohol  2 drop Both Eyes Q4H   Continuous Infusions:  piperacillin-tazobactam (ZOSYN)  IV 3.375 g (01/19/20 0532)     LOS: 7 days    Time spent:40 min    Bensen Chadderdon, Roselind Messier, MD [facility] Pager [phone]  If 7PM-7AM, please contact night-coverage [email or web address] Password [facility] 01/19/2020, 8:34 AM

## 2020-01-20 ENCOUNTER — Inpatient Hospital Stay (HOSPITAL_COMMUNITY): Payer: Medicare Other

## 2020-01-20 DIAGNOSIS — N179 Acute kidney failure, unspecified: Secondary | ICD-10-CM | POA: Diagnosis not present

## 2020-01-20 DIAGNOSIS — E872 Acidosis: Secondary | ICD-10-CM | POA: Diagnosis not present

## 2020-01-20 DIAGNOSIS — L89003 Pressure ulcer of unspecified elbow, stage 3: Secondary | ICD-10-CM | POA: Diagnosis not present

## 2020-01-20 DIAGNOSIS — A419 Sepsis, unspecified organism: Secondary | ICD-10-CM | POA: Diagnosis not present

## 2020-01-20 LAB — PHOSPHORUS: Phosphorus: 4 mg/dL (ref 2.5–4.6)

## 2020-01-20 LAB — CBC WITH DIFFERENTIAL/PLATELET
Abs Immature Granulocytes: 0.91 10*3/uL — ABNORMAL HIGH (ref 0.00–0.07)
Basophils Absolute: 0.1 10*3/uL (ref 0.0–0.1)
Basophils Relative: 1 %
Eosinophils Absolute: 0.6 10*3/uL — ABNORMAL HIGH (ref 0.0–0.5)
Eosinophils Relative: 5 %
HCT: 37.8 % — ABNORMAL LOW (ref 39.0–52.0)
Hemoglobin: 12.1 g/dL — ABNORMAL LOW (ref 13.0–17.0)
Immature Granulocytes: 7 %
Lymphocytes Relative: 27 %
Lymphs Abs: 3.8 10*3/uL (ref 0.7–4.0)
MCH: 29.1 pg (ref 26.0–34.0)
MCHC: 32 g/dL (ref 30.0–36.0)
MCV: 90.9 fL (ref 80.0–100.0)
Monocytes Absolute: 1.5 10*3/uL — ABNORMAL HIGH (ref 0.1–1.0)
Monocytes Relative: 11 %
Neutro Abs: 6.9 10*3/uL (ref 1.7–7.7)
Neutrophils Relative %: 49 %
Platelets: 323 10*3/uL (ref 150–400)
RBC: 4.16 MIL/uL — ABNORMAL LOW (ref 4.22–5.81)
RDW: 14.6 % (ref 11.5–15.5)
WBC: 13.8 10*3/uL — ABNORMAL HIGH (ref 4.0–10.5)
nRBC: 0 % (ref 0.0–0.2)

## 2020-01-20 LAB — GLUCOSE, CAPILLARY
Glucose-Capillary: 107 mg/dL — ABNORMAL HIGH (ref 70–99)
Glucose-Capillary: 121 mg/dL — ABNORMAL HIGH (ref 70–99)
Glucose-Capillary: 158 mg/dL — ABNORMAL HIGH (ref 70–99)
Glucose-Capillary: 161 mg/dL — ABNORMAL HIGH (ref 70–99)
Glucose-Capillary: 71 mg/dL (ref 70–99)
Glucose-Capillary: 96 mg/dL (ref 70–99)

## 2020-01-20 LAB — COMPREHENSIVE METABOLIC PANEL
ALT: 60 U/L — ABNORMAL HIGH (ref 0–44)
AST: 35 U/L (ref 15–41)
Albumin: 2.8 g/dL — ABNORMAL LOW (ref 3.5–5.0)
Alkaline Phosphatase: 66 U/L (ref 38–126)
Anion gap: 9 (ref 5–15)
BUN: 20 mg/dL (ref 6–20)
CO2: 25 mmol/L (ref 22–32)
Calcium: 9.4 mg/dL (ref 8.9–10.3)
Chloride: 105 mmol/L (ref 98–111)
Creatinine, Ser: 1.14 mg/dL (ref 0.61–1.24)
GFR, Estimated: >60 mL/min (ref >60)
Glucose, Bld: 111 mg/dL — ABNORMAL HIGH (ref 70–99)
Potassium: 4.2 mmol/L (ref 3.5–5.1)
Sodium: 139 mmol/L (ref 135–145)
Total Bilirubin: <0.1 mg/dL — ABNORMAL LOW (ref 0.3–1.2)
Total Protein: 6.9 g/dL (ref 6.5–8.1)

## 2020-01-20 LAB — MAGNESIUM: Magnesium: 2 mg/dL (ref 1.7–2.4)

## 2020-01-20 NOTE — Progress Notes (Signed)
PROGRESS NOTE    Travis Cantrell  RKY:[id number] DOB: [birth year] DOA: 01/12/2020 PCP: Excell Seltzer, MD     Brief Narrative:  59 y.o. WM (nursing home resident)  PMHx quadriplegic with multiple sclerosis, decub ulcer and indwelling chronic Foley causing recurrent UTI   admitted with 2 days of nausea, vomiting septic shock, presumed UTI. Lactate elevated to 7 with AKI, anion gap acidosis and leukocytosis. Foley appears clogged and was replaced in the ED. Patient briefly required vasopressor support.  Tx to [facility] on 12/21. Per ID, patient will require IV Abx until 01/22/20.    Subjective: 12/27 afebrile overnight A/O x4, negative suprapubic pain   Assessment & Plan: Covid vaccination; vaccinated   Active Problems:   Septic shock (HCC)   Pressure injury of skin   Malnutrition of moderate degree   Septic shock due bacteremia and UTI:  Polymicrobial Bacteremia ( Staph species, E faecalis, Pseudomonas aeruginosa, Proteus) Continue IV fluid resuscitation -ID on board; will will need a TEE r -Cardiology consulted -Indwelling Foley was changed in the ED.  -01/15/20 Per ID, will hold off on TEE as the patient continues to improve clinically. Had repeat blood cultures on 12/21, will follow up.  -01/16/20 Patient continues to improve clinically. ID following, recommend changing Abx to Zosyn with total antibiotic treatment duration of 10 days, today is day 5/10.Patient's culture grew Proteus, Pseudomonas, and E faecalis.  -01/17/20 Continue IV Zosyn, end date of 01/22/20 to complete 10 days of antibiotic treatment.  -12/27 obtain repeat blood cultures and urine cultures.  AKI secondary to sepsis, blocked Foley -foley replaced -Renal ultrasound ruled out obstruction Lab Results  Component Value Date   CREATININE 1.14 01/20/2020   CREATININE 1.16 01/19/2020   CREATININE 1.05 01/18/2020   CREATININE 1.23 01/17/2020   CREATININE 1.04 01/16/2020  -AKI resolving -Strict in and out  -1.9 L -01/15/20 continue Foley catheter. -12/27 consult Urology. Patient has multiple stones in bilateral kidneys none of them are obstructing at the moment so do not believe there is anything urgently required however patient definitely needs to be on urology's radar.  Spoke with Dr. Estrellita [facility] via chat she recommended; Based on renal US can be seen as outpatient howeever i'd get noncontrast CT abd/pelvis to rule out obstructing stones. If no ureteral calculi then just outpatien referral  -12/27 CT abdomen and pelvis noncontrast pending per urology recommendation  Debilitating multiple sclerosis Patient is quadriparetic with minimal movement -from SNF Has decubitus ulcer, unstageable 01/16/20 Will need to return to SNF but patient and his family want a different facility. CM consulted and following.  01/17/20 Does not tolerate baclofen as it causes diarrhea. OT has seen him today, will resume braces for his UE.   Acute respiratory failure -wean to off as tolerated 01/15/20 Resolved, undetermined cause. Per ID, no suspicion for pna.   Stage II sacral decubitus ulcer Pressure Injury 01/12/20 Buttocks Mid Stage 2 -  Partial thickness loss of dermis presenting as a shallow open injury with a red, pink wound bed without slough. (Active)  01/12/20 1700  Location: Buttocks  Location Orientation: Mid  Staging: Stage 2 -  Partial thickness loss of dermis presenting as a shallow open injury with a red, pink wound bed without slough.  Wound Description (Comments):   Present on Admission: Yes     Pressure Injury 01/12/20 Back Right;Upper;Bilateral Stage 2 -  Partial thickness loss of dermis presenting as a shallow open injury with a red, pink wound bed without  slough. covered with yeast (Active)  01/12/20 1700  Location: Back  Location Orientation: Right;Upper;Bilateral  Staging: Stage 2 -  Partial thickness loss of dermis presenting as a shallow open injury with a red, pink wound  bed without slough.  Wound Description (Comments): covered with yeast  Present on Admission: Yes   Constipation Reports not BM in 5 days. Denies abdominal pain or nausea.  Will start scheduled docusate and Miralax.   Hypomagnesmia -Magnesium goal> 2     DVT prophylaxis: Subcu heparin Code Status: Full Family Communication:  Status is: Inpatient    Dispo: The patient is from:               Anticipated d/c is to:               Anticipated d/c date is: 12/30              Patient currently unstable      Consultants:  Dr. Estrellita [facility] via chat    Procedures/Significant Events:  12/19 US renal;Right Kidney:  Renal measurements: 10.2 x 5.4 x 5.6 cm = volume: 162 mL. Several stones seen in the right kidney with the largest measuring 24 mm. No hydronephrosis.  Left Kidney:  Renal measurements: 11 x 6 x 5 cm = volume: 173.5 mL. Contains a 15 mm nonobstructive stone.  Bladder:  The bladder is partially decompressed with a Foley catheter.  I have personally reviewed and interpreted all radiology studies and my findings are as above.  VENTILATOR SETTINGS:    Cultures 12/19 blood positive Enterococcus faecalis, Staphylococcus, Proteus, Pseudomonas aeruginosa 12/19 blood LEFT hand positive Pseudomonas aeruginosa, Proteus Mirabilis 12/19 blood  positive Pseudomonas aeruginosa, Proteus Mirabilis, Enterococcus faecalis  12/19 urine positive Pseudomonas aeruginosa, Proteus Mirabilis 12/19 MRSA by PCR negative 12/21 blood LEFT hand negative final 12/21 blood LEFT forearm negative final 12/27 urine pending 12/27 blood pending    Antimicrobials: Anti-infectives (From admission, onward)   Start     Ordered Stop   01/16/20 1400  piperacillin-tazobactam (ZOSYN) IVPB 3.375 g        01/16/20 1146 01/22/20 2359   01/14/20 1000  DAPTOmycin (CUBICIN) 620 mg in sodium chloride 0.9 % IVPB  Status:  Discontinued        01/14/20 0905 01/16/20 1146   01/14/20  0900  vancomycin (VANCOCIN) IVPB 1000 mg/200 mL premix  Status:  Discontinued        01/14/20 0734 01/14/20 0905   01/13/20 1300  ceFEPIme (MAXIPIME) 2 g in sodium chloride 0.9 % 100 mL IVPB  Status:  Discontinued        01/12/20 1445 01/16/20 1146   01/12/20 1442  vancomycin variable dose per unstable renal function (pharmacist dosing)  Status:  Discontinued        01/12/20 1445 01/14/20 0736   01/12/20 1300  ceFEPIme (MAXIPIME) 2 g in sodium chloride 0.9 % 100 mL IVPB        01/12/20 1250 01/12/20 1347   01/12/20 1300  vancomycin (VANCOCIN) IVPB 1000 mg/200 mL premix  Status:  Discontinued        01/12/20 1250 01/12/20 1257   01/12/20 1300  vancomycin (VANCOREADY) IVPB 1750 mg/350 mL        01/12/20 1257 01/12/20 1550       Devices    LINES / TUBES:      Continuous Infusions: . piperacillin-tazobactam (ZOSYN)  IV 3.375 g (01/20/20 0521)     Objective: Vitals:   01/19/20 0814 01/19/20  1654 01/19/20 1955 01/20/20 0435  BP: 122/72 110/67 119/69 130/74  Pulse: (!) 56 62 (!) 57 (!) 58  Resp: 16 16 14 15   Temp: 98.9 F (37.2 C) 98.8 F (37.1 C) 98.8 F (37.1 C) 98 F (36.7 C)  TempSrc: Oral Oral Oral Oral  SpO2: 98% 97% 97% 96%  Weight:      Height:        Intake/Output Summary (Last 24 hours) at 01/20/2020 1036 Last data filed at 01/20/2020 1610 Gross per 24 hour  Intake 590 ml  Output 1950 ml  Net -1360 ml   Filed Weights   01/14/20 0500 01/15/20 0500 01/19/20 0500  Weight: 77.5 kg 75.6 kg 76.1 kg    Examination:  General: A/O x4, No acute respiratory distress Eyes: negative scleral hemorrhage, negative anisocoria, negative icterus ENT: Negative Runny nose, negative gingival bleeding, Neck:  Negative scars, masses, torticollis, lymphadenopathy, JVD Lungs: Clear to auscultation bilaterally without wheezes or crackles Cardiovascular: Regular rate and rhythm without murmur gallop or rub normal S1 and S2 Abdomen: negative abdominal pain, positive  distention, positive soft, bowel sounds, no rebound, no ascites, no appreciable mass Extremities: contractures Skin: Sacral decubitus ulcer Psychiatric:  Negative depression, negative anxiety, negative fatigue, negative mania  Central nervous system:  Cranial nerves II through XII intact, tongue/uvula midline, negative dysarthria, negative expressive aphasia, negative receptive aphasia.  .     Data Reviewed: Care during the described time interval was provided by me .  I have reviewed this patient's available data, including medical history, events of note, physical examination, and all test results as part of my evaluation.  CBC: Recent Labs  Lab 01/14/20 0529 01/15/20 0516 01/19/20 0115 01/20/20 0342  WBC 10.7* 8.4 12.5* 13.8*  NEUTROABS  --   --  5.5 6.9  HGB 11.0* 11.6* 11.0* 12.1*  HCT 36.1* 35.7* 35.8* 37.8*  MCV 90.5 89.7 91.3 90.9  PLT 204 202 308 323   Basic Metabolic Panel: Recent Labs  Lab 01/16/20 0138 01/17/20 0310 01/18/20 0209 01/19/20 0115 01/20/20 0342  NA 143 142 144 138 139  K 3.2* 4.0 4.4 4.1 4.2  CL 107 107 106 105 105  CO2 23 26 26 26 25   GLUCOSE 136* 105* 103* 131* 111*  BUN 20 20 22* 22* 20  CREATININE 1.04 1.23 1.05 1.16 1.14  CALCIUM 8.4* 8.6* 9.4 8.8* 9.4  MG  --  1.7 1.9 1.8 2.0  PHOS  --   --  3.8 3.0 4.0   GFR: Estimated Creatinine Clearance: 72 mL/min (by C-G formula based on SCr of 1.14 mg/dL). Liver Function Tests: Recent Labs  Lab 01/18/20 0209 01/19/20 0115 01/20/20 0342  AST  --  45* 35  ALT  --  55* 60*  ALKPHOS  --  58 66  BILITOT  --  0.6 <0.1*  PROT  --  5.9* 6.9  ALBUMIN 2.5* 2.4* 2.8*   No results for input(s): LIPASE, AMYLASE in the last 168 hours. No results for input(s): AMMONIA in the last 168 hours. Coagulation Profile: No results for input(s): INR, PROTIME in the last 168 hours. Cardiac Enzymes: Recent Labs  Lab 01/15/20 0516  CKTOTAL 17*   BNP (last 3 results) No results for input(s): PROBNP in the  last 8760 hours. HbA1C: No results for input(s): HGBA1C in the last 72 hours. CBG: Recent Labs  Lab 01/19/20 1557 01/19/20 1956 01/20/20 0019 01/20/20 0433 01/20/20 0737  GLUCAP 141* 152* 121* 107* 96   Lipid Profile: No results  for input(s): CHOL, HDL, LDLCALC, TRIG, CHOLHDL, LDLDIRECT in the last 72 hours. Thyroid Function Tests: No results for input(s): TSH, T4TOTAL, FREET4, T3FREE, THYROIDAB in the last 72 hours. Anemia Panel: No results for input(s): VITAMINB12, FOLATE, FERRITIN, TIBC, IRON, RETICCTPCT in the last 72 hours. Sepsis Labs: No results for input(s): PROCALCITON, LATICACIDVEN in the last 168 hours.  Recent Results (from the past 240 hour(s))  Blood Culture (routine x 2)     Status: Abnormal   Collection Time: 01/12/20 12:56 PM   Specimen: BLOOD  Result Value Ref Range Status   Specimen Description BLOOD SITE NOT SPECIFIED  Final   Special Requests   Final    BOTTLES DRAWN AEROBIC AND ANAEROBIC Blood Culture results may not be optimal due to an inadequate volume of blood received in culture bottles   Culture  Setup Time   Final    GRAM NEGATIVE RODS GRAM POSITIVE COCCI IN CHAINS IN BOTH AEROBIC AND ANAEROBIC BOTTLES GRAM POSITIVE COCCI IN CLUSTERS AEROBIC BOTTLE ONLY CRITICAL RESULT CALLED TO, READ BACK BY AND VERIFIED WITH: J. LEDFORD,PHARMD [id number] 01/13/2020 T. TYSOR    Culture (A)  Final    PROTEUS MIRABILIS PSEUDOMONAS AERUGINOSA ENTEROCOCCUS FAECALIS ORG 1 AND 2 SUSCEPTIBILITIES PERFORMED ON PREVIOUS CULTURE WITHIN THE LAST 5 DAYS. Performed at [facility], [address], [place], [place] [address]    Report Status 01/17/2020 FINAL  Final   Organism ID, Bacteria ENTEROCOCCUS FAECALIS  Final      Susceptibility   Enterococcus faecalis - MIC*    AMPICILLIN <=2 SENSITIVE Sensitive     VANCOMYCIN 1 SENSITIVE Sensitive     GENTAMICIN SYNERGY SENSITIVE Sensitive     * ENTEROCOCCUS FAECALIS  Blood Culture (routine x 2)     Status: Abnormal    Collection Time: 01/12/20 12:56 PM   Specimen: BLOOD LEFT HAND  Result Value Ref Range Status   Specimen Description BLOOD LEFT HAND  Final   Special Requests   Final    BOTTLES DRAWN AEROBIC AND ANAEROBIC Blood Culture adequate volume   Culture  Setup Time   Final    GRAM NEGATIVE RODS IN BOTH AEROBIC AND ANAEROBIC BOTTLES CRITICAL VALUE NOTED.  VALUE IS CONSISTENT WITH PREVIOUSLY REPORTED AND CALLED VALUE. Performed at [facility], [address] [place], [place] [address]    Culture PSEUDOMONAS AERUGINOSA PROTEUS MIRABILIS  (A)  Final   Report Status 01/16/2020 FINAL  Final   Organism ID, Bacteria PSEUDOMONAS AERUGINOSA  Final   Organism ID, Bacteria PROTEUS MIRABILIS  Final      Susceptibility   Pseudomonas aeruginosa - MIC*    CEFTAZIDIME 4 SENSITIVE Sensitive     CIPROFLOXACIN <=0.25 SENSITIVE Sensitive     GENTAMICIN 8 INTERMEDIATE Intermediate     IMIPENEM 2 SENSITIVE Sensitive     PIP/TAZO <=4 SENSITIVE Sensitive     CEFEPIME 4 SENSITIVE Sensitive     * PSEUDOMONAS AERUGINOSA   Proteus mirabilis - MIC*    AMPICILLIN 16 INTERMEDIATE Intermediate     CEFAZOLIN 8 SENSITIVE Sensitive     CEFEPIME 0.25 SENSITIVE Sensitive     CEFTAZIDIME <=1 SENSITIVE Sensitive     CEFTRIAXONE <=0.25 SENSITIVE Sensitive     CIPROFLOXACIN >=4 RESISTANT Resistant     GENTAMICIN <=1 SENSITIVE Sensitive     IMIPENEM >=16 RESISTANT Resistant     TRIMETH/SULFA >=320 RESISTANT Resistant     AMPICILLIN/SULBACTAM 8 SENSITIVE Sensitive     PIP/TAZO <=4 SENSITIVE Sensitive     *  PROTEUS MIRABILIS  Urine culture     Status: Abnormal   Collection Time: 01/12/20 12:56 PM   Specimen: In/Out Cath Urine  Result Value Ref Range Status   Specimen Description IN/OUT CATH URINE  Final   Special Requests   Final    NONE Performed at [facility], [address], [place], [place] [address]    Culture (A)  Final    >=100,000 COLONIES/mL PROTEUS MIRABILIS >=100,000 COLONIES/mL PSEUDOMONAS  AERUGINOSA    Report Status 01/17/2020 FINAL  Final   Organism ID, Bacteria PROTEUS MIRABILIS (A)  Final   Organism ID, Bacteria PSEUDOMONAS AERUGINOSA (A)  Final      Susceptibility   Pseudomonas aeruginosa - MIC*    CEFTAZIDIME 4 SENSITIVE Sensitive     CIPROFLOXACIN <=0.25 SENSITIVE Sensitive     GENTAMICIN 8 INTERMEDIATE Intermediate     IMIPENEM 1 SENSITIVE Sensitive     PIP/TAZO <=4 SENSITIVE Sensitive     * >=100,000 COLONIES/mL PSEUDOMONAS AERUGINOSA   Proteus mirabilis - MIC*    AMPICILLIN <=2 SENSITIVE Sensitive     CEFAZOLIN <=4 SENSITIVE Sensitive     CEFEPIME <=0.12 SENSITIVE Sensitive     CEFTRIAXONE <=0.25 SENSITIVE Sensitive     CIPROFLOXACIN <=0.25 SENSITIVE Sensitive     GENTAMICIN <=1 SENSITIVE Sensitive     IMIPENEM 2 SENSITIVE Sensitive     NITROFURANTOIN RESISTANT Resistant     TRIMETH/SULFA <=20 SENSITIVE Sensitive     AMPICILLIN/SULBACTAM <=2 SENSITIVE Sensitive     PIP/TAZO <=4 SENSITIVE Sensitive     * >=100,000 COLONIES/mL PROTEUS MIRABILIS  Resp Panel by RT-PCR (Flu A&B, Covid) Nasopharyngeal Swab     Status: None   Collection Time: 01/12/20 12:56 PM   Specimen: Nasopharyngeal Swab; Nasopharyngeal(NP) swabs in vial transport medium  Result Value Ref Range Status   SARS Coronavirus 2 by RT PCR NEGATIVE NEGATIVE Final    Comment: (NOTE) SARS-CoV-2 target nucleic acids are NOT DETECTED.  The SARS-CoV-2 RNA is generally detectable in upper respiratory specimens during the acute phase of infection. The lowest concentration of SARS-CoV-2 viral copies this assay can detect is 138 copies/mL. A negative result does not preclude SARS-Cov-2 infection and should not be used as the sole basis for treatment or other patient management decisions. A negative result may occur with  improper specimen collection/handling, submission of specimen other than nasopharyngeal swab, presence of viral mutation(s) within the areas targeted by this assay, and inadequate  number of viral copies(<138 copies/mL). A negative result must be combined with clinical observations, patient history, and epidemiological information. The expected result is Negative.  Fact Sheet for Patients:  BloggerCourse.com  Fact Sheet for Healthcare Providers:  SeriousBroker.it  This test is no t yet approved or cleared by the [place] FDA and  has been authorized for detection and/or diagnosis of SARS-CoV-2 by FDA under an Emergency Use Authorization (EUA). This EUA will remain  in effect (meaning this test can be used) for the duration of the COVID-19 declaration under Section 564(b)(1) of the Act, 21 U.S.C.section 360bbb-3(b)(1), unless the authorization is terminated  or revoked sooner.       Influenza A by PCR NEGATIVE NEGATIVE Final   Influenza B by PCR NEGATIVE NEGATIVE Final    Comment: (NOTE) The Xpert Xpress SARS-CoV-2/FLU/RSV plus assay is intended as an aid in the diagnosis of influenza from Nasopharyngeal swab specimens and should not be used as a sole basis for treatment. Nasal washings and aspirates are unacceptable for Xpert Xpress SARS-CoV-2/FLU/RSV  testing.  Fact Sheet for Patients: BloggerCourse.com  Fact Sheet for Healthcare Providers: SeriousBroker.it  This test is not yet approved or cleared by the [place] FDA and has been authorized for detection and/or diagnosis of SARS-CoV-2 by FDA under an Emergency Use Authorization (EUA). This EUA will remain in effect (meaning this test can be used) for the duration of the COVID-19 declaration under Section 564(b)(1) of the Act, 21 U.S.C. section 360bbb-3(b)(1), unless the authorization is terminated or revoked.  Performed at [facility], [address] [place], [place] [address]   Blood Culture ID Panel (Reflexed)     Status: Abnormal   Collection Time: 01/12/20 12:56 PM  Result  Value Ref Range Status   Enterococcus faecalis DETECTED (A) NOT DETECTED Final    Comment: CRITICAL RESULT CALLED TO, READ BACK BY AND VERIFIED WITH: J. LEDFORD,PHARMD [id number] 01/13/2020 T. TYSOR    Enterococcus Faecium NOT DETECTED NOT DETECTED Final   Listeria monocytogenes NOT DETECTED NOT DETECTED Final   Staphylococcus species DETECTED (A) NOT DETECTED Final    Comment: CRITICAL RESULT CALLED TO, READ BACK BY AND VERIFIED WITH: J. LEDFORD,PHARMD [id number] 01/13/2020 T. TYSOR    Staphylococcus aureus (BCID) NOT DETECTED NOT DETECTED Final   Staphylococcus epidermidis NOT DETECTED NOT DETECTED Final   Staphylococcus lugdunensis NOT DETECTED NOT DETECTED Final   Streptococcus species NOT DETECTED NOT DETECTED Final   Streptococcus agalactiae NOT DETECTED NOT DETECTED Final   Streptococcus pneumoniae NOT DETECTED NOT DETECTED Final   Streptococcus pyogenes NOT DETECTED NOT DETECTED Final   A.calcoaceticus-baumannii NOT DETECTED NOT DETECTED Final   Bacteroides fragilis NOT DETECTED NOT DETECTED Final   Enterobacterales DETECTED (A) NOT DETECTED Final    Comment: Enterobacterales represent a large order of gram negative bacteria, not a single organism. CRITICAL RESULT CALLED TO, READ BACK BY AND VERIFIED WITH: J. LEDFORD,PHARMD [id number] 01/13/2020 T. TYSOR    Enterobacter cloacae complex NOT DETECTED NOT DETECTED Final   Escherichia coli NOT DETECTED NOT DETECTED Final   Klebsiella aerogenes NOT DETECTED NOT DETECTED Final   Klebsiella oxytoca NOT DETECTED NOT DETECTED Final   Klebsiella pneumoniae NOT DETECTED NOT DETECTED Final   Proteus species DETECTED (A) NOT DETECTED Final    Comment: CRITICAL RESULT CALLED TO, READ BACK BY AND VERIFIED WITH: J. LEDFORD,PHARMD [id number] 01/13/2020 T. TYSOR    Salmonella species NOT DETECTED NOT DETECTED Final   Serratia marcescens NOT DETECTED NOT DETECTED Final   Haemophilus influenzae NOT DETECTED NOT DETECTED Final   Neisseria meningitidis NOT DETECTED  NOT DETECTED Final   Pseudomonas aeruginosa DETECTED (A) NOT DETECTED Final    Comment: CRITICAL RESULT CALLED TO, READ BACK BY AND VERIFIED WITH: J. LEDFORD,PHARMD [id number] 01/13/2020 T. TYSOR    Stenotrophomonas maltophilia NOT DETECTED NOT DETECTED Final   Candida albicans NOT DETECTED NOT DETECTED Final   Candida auris NOT DETECTED NOT DETECTED Final   Candida glabrata NOT DETECTED NOT DETECTED Final   Candida krusei NOT DETECTED NOT DETECTED Final   Candida parapsilosis NOT DETECTED NOT DETECTED Final   Candida tropicalis NOT DETECTED NOT DETECTED Final   Cryptococcus neoformans/gattii NOT DETECTED NOT DETECTED Final   CTX-M ESBL NOT DETECTED NOT DETECTED Final   Carbapenem resistance IMP NOT DETECTED NOT DETECTED Final   Carbapenem resistance KPC NOT DETECTED NOT DETECTED Final   Carbapenem resistance NDM NOT DETECTED NOT DETECTED Final   Carbapenem resist OXA 48 LIKE NOT DETECTED NOT DETECTED Final   Vancomycin resistance NOT DETECTED NOT DETECTED Final  Carbapenem resistance VIM NOT DETECTED NOT DETECTED Final    Comment: Performed at [facility], [address], [place], [place] [address]  MRSA PCR Screening     Status: None   Collection Time: 01/12/20  9:10 PM   Specimen: Nasal Mucosa; Nasopharyngeal  Result Value Ref Range Status   MRSA by PCR NEGATIVE NEGATIVE Final    Comment:        The GeneXpert MRSA Assay (FDA approved for NASAL specimens only), is one component of a comprehensive MRSA colonization surveillance program. It is not intended to diagnose MRSA infection nor to guide or monitor treatment for MRSA infections. Performed at [facility], [address] [place], [place] [address]   Culture, blood (routine x 2)     Status: None   Collection Time: 01/14/20 10:16 AM   Specimen: BLOOD LEFT HAND  Result Value Ref Range Status   Specimen Description BLOOD LEFT HAND  Final   Special Requests   Final    BOTTLES DRAWN AEROBIC AND ANAEROBIC Blood  Culture adequate volume   Culture   Final    NO GROWTH 5 DAYS Performed at [facility], [address], [place], [place] [address]    Report Status 01/19/2020 FINAL  Final  Culture, blood (routine x 2)     Status: None   Collection Time: 01/14/20 10:21 AM   Specimen: BLOOD LEFT FOREARM  Result Value Ref Range Status   Specimen Description BLOOD LEFT FOREARM  Final   Special Requests   Final    BOTTLES DRAWN AEROBIC AND ANAEROBIC Blood Culture adequate volume   Culture   Final    NO GROWTH 5 DAYS Performed at [facility], [address] [place], [place] [address]    Report Status 01/19/2020 FINAL  Final         Radiology Studies: No results found.      Scheduled Meds: . Chlorhexidine Gluconate Cloth  6 each Topical Daily  . docusate sodium  100 mg Oral BID  . feeding supplement  237 mL Oral TID WC  . heparin  5,000 Units Subcutaneous Q8H  . insulin aspart  0-9 Units Subcutaneous Q4H  . multivitamin with minerals  1 tablet Oral Daily  . polyethylene glycol  17 g Oral Daily  . polyvinyl alcohol  2 drop Both Eyes Q4H   Continuous Infusions: . piperacillin-tazobactam (ZOSYN)  IV 3.375 g (01/20/20 0521)     LOS: 8 days    Time spent:40 min    Singleton Hickox, Roselind Messier, MD [facility] Pager [phone]  If 7PM-7AM, please contact night-coverage [email or web address] Password [facility] 01/20/2020, 10:36 AM

## 2020-01-21 DIAGNOSIS — A419 Sepsis, unspecified organism: Secondary | ICD-10-CM | POA: Diagnosis not present

## 2020-01-21 DIAGNOSIS — L89003 Pressure ulcer of unspecified elbow, stage 3: Secondary | ICD-10-CM | POA: Diagnosis not present

## 2020-01-21 DIAGNOSIS — L89150 Pressure ulcer of sacral region, unstageable: Secondary | ICD-10-CM

## 2020-01-21 DIAGNOSIS — E872 Acidosis: Secondary | ICD-10-CM | POA: Diagnosis not present

## 2020-01-21 DIAGNOSIS — N179 Acute kidney failure, unspecified: Secondary | ICD-10-CM | POA: Diagnosis not present

## 2020-01-21 DIAGNOSIS — B9689 Other specified bacterial agents as the cause of diseases classified elsewhere: Secondary | ICD-10-CM | POA: Diagnosis present

## 2020-01-21 LAB — CBC WITH DIFFERENTIAL/PLATELET
Abs Immature Granulocytes: 0.9 10*3/uL — ABNORMAL HIGH (ref 0.00–0.07)
Basophils Absolute: 0.1 10*3/uL (ref 0.0–0.1)
Basophils Relative: 1 %
Eosinophils Absolute: 0.5 10*3/uL (ref 0.0–0.5)
Eosinophils Relative: 3 %
HCT: 40.7 % (ref 39.0–52.0)
Hemoglobin: 13.1 g/dL (ref 13.0–17.0)
Immature Granulocytes: 6 %
Lymphocytes Relative: 28 %
Lymphs Abs: 4.6 10*3/uL — ABNORMAL HIGH (ref 0.7–4.0)
MCH: 29 pg (ref 26.0–34.0)
MCHC: 32.2 g/dL (ref 30.0–36.0)
MCV: 90.2 fL (ref 80.0–100.0)
Monocytes Absolute: 1.4 10*3/uL — ABNORMAL HIGH (ref 0.1–1.0)
Monocytes Relative: 9 %
Neutro Abs: 8.8 10*3/uL — ABNORMAL HIGH (ref 1.7–7.7)
Neutrophils Relative %: 53 %
Platelets: 376 10*3/uL (ref 150–400)
RBC: 4.51 MIL/uL (ref 4.22–5.81)
RDW: 15 % (ref 11.5–15.5)
WBC: 16.3 10*3/uL — ABNORMAL HIGH (ref 4.0–10.5)
nRBC: 0 % (ref 0.0–0.2)

## 2020-01-21 LAB — GLUCOSE, CAPILLARY
Glucose-Capillary: 103 mg/dL — ABNORMAL HIGH (ref 70–99)
Glucose-Capillary: 118 mg/dL — ABNORMAL HIGH (ref 70–99)
Glucose-Capillary: 136 mg/dL — ABNORMAL HIGH (ref 70–99)
Glucose-Capillary: 140 mg/dL — ABNORMAL HIGH (ref 70–99)
Glucose-Capillary: 149 mg/dL — ABNORMAL HIGH (ref 70–99)
Glucose-Capillary: 153 mg/dL — ABNORMAL HIGH (ref 70–99)
Glucose-Capillary: 99 mg/dL (ref 70–99)

## 2020-01-21 LAB — COMPREHENSIVE METABOLIC PANEL
ALT: 52 U/L — ABNORMAL HIGH (ref 0–44)
AST: 27 U/L (ref 15–41)
Albumin: 2.9 g/dL — ABNORMAL LOW (ref 3.5–5.0)
Alkaline Phosphatase: 65 U/L (ref 38–126)
Anion gap: 11 (ref 5–15)
BUN: 22 mg/dL — ABNORMAL HIGH (ref 6–20)
CO2: 25 mmol/L (ref 22–32)
Calcium: 9.7 mg/dL (ref 8.9–10.3)
Chloride: 102 mmol/L (ref 98–111)
Creatinine, Ser: 1.14 mg/dL (ref 0.61–1.24)
GFR, Estimated: >60 mL/min (ref >60)
Glucose, Bld: 100 mg/dL — ABNORMAL HIGH (ref 70–99)
Potassium: 4.7 mmol/L (ref 3.5–5.1)
Sodium: 138 mmol/L (ref 135–145)
Total Bilirubin: 0.6 mg/dL (ref 0.3–1.2)
Total Protein: 7.4 g/dL (ref 6.5–8.1)

## 2020-01-21 LAB — MAGNESIUM: Magnesium: 2.1 mg/dL (ref 1.7–2.4)

## 2020-01-21 LAB — URINE CULTURE: Culture: NO GROWTH

## 2020-01-21 LAB — PHOSPHORUS: Phosphorus: 4.5 mg/dL (ref 2.5–4.6)

## 2020-01-21 MED ORDER — CIPROFLOXACIN HCL 500 MG PO TABS
500.0000 mg | ORAL_TABLET | Freq: Two times a day (BID) | ORAL | Status: DC
  Administered 2020-01-23 – 2020-01-25 (×6): 500 mg via ORAL
  Filled 2020-01-21 (×7): qty 1

## 2020-01-21 NOTE — Care Management Important Message (Signed)
Important Message  Patient Details  Name: TUKKER BYRNS MRN: [id number] Date of Birth: 08/02/1960   Medicare Important Message Given:  Yes     Athelene Hursey 01/21/2020, 4:03 PM

## 2020-01-21 NOTE — Care Management (Addendum)
Called [facility] and spoke with Lynetta Mare, secretary at [phone] option [phone].  She was unable to confirm a follow up date or appointment but will speak with Dr. Berneice Heinrich and return my call tomorrow for possible appointment.  01/21/2020 Lynetta Mare, CM called back from [facility] and patient is scheduled with Dr. Berneice Heinrich at the office on February 10, 2020 at 2 pm - noted in the discharge instructions.

## 2020-01-21 NOTE — Progress Notes (Signed)
Occupational Therapy Treatment Patient Details Name: LOWRY BALA MRN: [id number] DOB: 1960-03-11 Today's Date: 01/21/2020    History of present illness 59 Y O Male from NH with PMH of MS, GERD, HTN, quadriplegic, bedbound with decubitus ulcer and indwelling foleys catheter who presented to the ED 12/19 with complaints of nausea and vomiting for 2 days. He also had leaking around his Foleys. And complained of some SOB and nonproductive cough during admission. Found to be septic and admitted for treatment of septic shock.   OT comments  Provided PROM stretching to B UE and assessed skin integrity (redness noted in elbow creases). Pt with rolled washcloths in B hands. Still awaiting communication with family regarding bringing splints from SNF to assess fit and manage contractures. Attempted to call family during session with pt, left VM for daughter. With collaboration, pt able to identify similar elbow splints to what he previously utilized. Pt motivated to wear splints consistently. Will continue to follow up with pt/family on splints, as well as other avenues to prevent contracture progression.    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  Transfers                      Balance                                           ADL either performed or assessed with clinical judgement   ADL Overall ADL's : Needs assistance/impaired                                       General ADL Comments: total A with ADLs at baseline      Vision   Vision Assessment?: No apparent visual deficits   Perception     Praxis      Cognition Arousal/Alertness: Awake/alert Behavior During Therapy: Flat affect Overall Cognitive Status: No family/caregiver present to determine baseline cognitive functioning                                 General Comments: Follows commands; appears [facility] for basic tasks/questions however did not fully assess. Oriented to place, date and situation. Flat affect, soft spoken        Exercises Exercises: General Upper Extremity General Exercises - Upper Extremity Elbow Flexion: PROM;Both;5 reps Elbow Extension: PROM;Both;5 reps Wrist Flexion: PROM;Both;5 reps Wrist Extension: PROM;Both;5 reps Digit Composite Flexion: PROM;Both;5 reps Composite Extension: PROM;Both;5 reps Other Exercises Other Exercises: Passive stretch performed bil. elbows into extension - fixed contractures noted.  PROM of bil. wrist and hands performed. bil. thumbs with fixed contractures   Shoulder Instructions       General Comments Pt attempted to contact daughter via phone during session but  no answer (OT left VM), sister answered but was busy at MD office. Collaborated with pt and pt able to identify similarities of braces for B UE with pictures. Educated on skin integrity and best positioning of joints to allow skin to breathe.Difficulty with voice controls using Siri, OT attempting to assist pt to improve ability to communicate with family/QOL    Pertinent Vitals/ Pain       Pain Assessment: Faces Faces Pain Scale: Hurts a little bit Pain Location: grimacing with PROM of extremities Pain Descriptors / Indicators: Grimacing Pain Intervention(s): Monitored during session;Repositioned  Home Living                                          Prior Functioning/Environment              Frequency  Min 1X/week        Progress Toward Goals  OT Goals(current goals can now be  found in the care plan section)  Progress towards OT goals: OT to reassess next treatment  Acute Rehab OT Goals Patient Stated Goal: wear braces daily for UE's and have LE boots in proper position. OT Goal Formulation: With patient Time For Goal Achievement: 01/30/20 Potential to Achieve Goals: Fair ADL Goals Additional ADL Goal #1: Pt will tolerate bil. hand and elbow splints without increased pain or increased pressure Additional ADL Goal #2: Pt will independently direct staff on wear schedule for splints  Plan Discharge plan remains appropriate    Co-evaluation                 AM-PAC OT "6 Clicks" Daily Activity     Outcome Measure   Help from another person eating meals?: Total Help from another person taking care of personal grooming?: Total Help from another person toileting, which includes using toliet, bedpan, or urinal?: Total Help from another person bathing (including washing, rinsing, drying)?: Total Help from another person to put on and taking off regular upper body clothing?: Total Help from another person to put on and taking off regular lower body clothing?: Total 6 Click Score: 6    End of Session    OT Visit Diagnosis: Muscle weakness (generalized) (M62.81);Other symptoms and signs involving the nervous system (R29.898);Other (comment)   Activity Tolerance Patient tolerated treatment well   Patient Left in bed;with call bell/phone within reach;with bed alarm set   Nurse Communication Mobility status;Other (comment) (pt difficulty with phone use/voice controls)        Time: 0086-7619 OT Time Calculation (min): 17 min  Charges: OT General Charges $OT Visit: 1 Visit OT Treatments $Therapeutic Exercise: 8-22 mins  Lorre Munroe, OTR/L   Lorre Munroe 01/21/2020, 2:51 PM

## 2020-01-21 NOTE — Progress Notes (Signed)
Nutrition Follow-up  DOCUMENTATION CODES:   Non-severe (moderate) malnutrition in context of chronic illness  INTERVENTION:   -Continue feeding assistance with meals -Continue MVI with minerals daily -Continue Magic cup TID with meals, each supplement provides 290 kcal and 9 grams of protein -Continue Enlive po TID, each supplement provides 350 kcal and 20 grams of protein  NUTRITION DIAGNOSIS:   Moderate Malnutrition related to chronic illness (MS) as evidenced by energy intake < or equal to 75% for > or equal to 1 month,mild fat depletion,moderate fat depletion,mild muscle depletion,moderate muscle depletion.  Ongoing  GOAL:   Patient will meet greater than or equal to 90% of their needs  Progressing   MONITOR:   Supplement acceptance,PO intake,Labs,Weight trends,Skin,I & O's  REASON FOR ASSESSMENT:   Low Braden    ASSESSMENT:   Travis Cantrell is an 59 y.o. male who is a nursing home resident, quadriplegic with multiple sclerosis, decub ulcer and indwelling chronic Foley causing recurrent UTI admitted with 2 days of nausea, vomiting septic shock, presumed UTI.  Lactate elevated to 7 with AKI, anion gap acidosis and leukocytosis.  Foley appears clogged and was replaced in the ED.  Patient briefly required vasopressor support.  Reviewed I/O's: -210 ml x 24 hours and -4.1 L since admission  UOP: 21 L x 24 hours  Pt remains on IV antibiotics; end date 01/22/20  Per MD notes, plan for urology consult due to multiple stones in bilateral kidneys.   Pt with improved appetite. Noted meal completion 50-100%. Pt is consuming Ensure supplements.   Medications reviewed and include colace.   Plan to d/c to SNF once medically stable.  Labs reviewed: CBGS: 99-153 (inpatient orders for glycemic control are 0-9 units insulin aspart every 4 hours).   Diet Order:   Diet Order            Diet Carb Modified Fluid consistency: Thin; Room service appropriate? Yes with Assist  Diet  effective now                 EDUCATION NEEDS:   Education needs have been addressed  Skin:  Skin Assessment: Skin Integrity Issues: Skin Integrity Issues:: Stage II Stage II: buttocks, rt upper back  Last BM:  01/18/20  Height:   Ht Readings from Last 1 Encounters:  01/13/20 5\' 10"  (1.778 m)    Weight:   Wt Readings from Last 1 Encounters:  01/21/20 76 kg    Ideal Body Weight:  66.1 kg (adjusted for quadriplegia)  BMI:  Body mass index is 24.04 kg/m.  Estimated Nutritional Needs:   Kcal:  2100-2300  Protein:  115-130 grams  Fluid:  > 2 L    01/23/20, RD, LDN, CDCES Registered Dietitian II Certified Diabetes Care and Education Specialist Please refer to [facility] for RD and/or RD on-call/weekend/after hours pager

## 2020-01-21 NOTE — Progress Notes (Signed)
PROGRESS NOTE    Travis Cantrell  MWU:[id number] DOB: 12/09/1960 DOA: 01/12/2020 PCP: Excell Seltzer, MD     Brief Narrative:  59 y.o. WM (nursing home resident)  PMHx quadriplegic with multiple sclerosis, decub ulcer and indwelling chronic Foley causing recurrent UTI   admitted with 2 days of nausea, vomiting septic shock, presumed UTI. Lactate elevated to 7 with AKI, anion gap acidosis and leukocytosis. Foley appears clogged and was replaced in the ED. Patient briefly required vasopressor support.  Tx to [facility] on 12/21. Per ID, patient will require IV Abx until 01/22/20.    Subjective: 12/28 afebrile overnight     afebrile overnight A/O x4, negative suprapubic pain   Assessment & Plan: Covid vaccination; vaccinated   Active Problems:   Septic shock (HCC)   Pressure injury of skin   Malnutrition of moderate degree   Septic shock due bacteremia and Complicated Pyelonephritis :  Polymicrobial Bacteremia ( Staph species, E faecalis, Pseudomonas aeruginosa, Proteus) Continue IV fluid resuscitation -ID on board; will will need a TEE r -Cardiology consulted -Indwelling Foley was changed in the ED.  -01/15/20 Per ID, will hold off on TEE as the patient continues to improve clinically. Had repeat blood cultures on 12/21, will follow up.  -01/16/20 Patient continues to improve clinically. ID following, recommend changing Abx to Zosyn with total antibiotic treatment duration of 10 days, today is day 5/10.Patient's culture grew Proteus, Pseudomonas, and E faecalis.  -01/17/20 Continue IV Zosyn, end date of 01/22/20 to complete 10 days of antibiotic treatment.  -12/27 obtain repeat blood cultures and urine cultures.   AKI secondary to sepsis, blocked Foley -foley replaced -Renal ultrasound ruled out obstruction Lab Results  Component Value Date   CREATININE 1.14 01/21/2020   CREATININE 1.14 01/20/2020   CREATININE 1.16 01/19/2020   CREATININE 1.05 01/18/2020   CREATININE  1.23 01/17/2020  -AKI resolving -Strict in and out -1.9 L -01/15/20 continue Foley catheter. -12/27 consult Urology. Patient has multiple stones in bilateral kidneys none of them are obstructing at the moment so do not believe there is anything urgently required however patient definitely needs to be on urology's radar.  Spoke with Dr. Estrellita Ludwig Urology via chat she recommended; Based on renal US can be seen as outpatient howeever i'd get noncontrast CT abd/pelvis to rule out obstructing stones. If no ureteral calculi then just outpatien referral  -12/27 CT abdomen and pelvis noncontrast pending per urology recommendation -12/28 consulted with Dr. Berneice Heinrich [facility] who reviewed his CT findings and felt that at some point he would need surgery however not at this admission.  Recommended that we discharge him on additional 10 days of ciprofloxacin secondary to his high risk -Schedule follow-up appointment with Dr. Berneice Heinrich or Dr. Arita Miss at [facility] urology in 1 to 2 weeks sepsis UTI/complicated pyelonephritis -12/28 have informed patient he will need to follow-up with urology to discuss RIGHT percutaneous nephrolithotomy.  Debilitating multiple sclerosis Patient is quadriparetic with minimal movement -from SNF Has decubitus ulcer, unstageable 01/16/20 Will need to return to SNF but patient and his family want a different facility. NCM consulted and following.  01/17/20 Does not tolerate baclofen as it causes diarrhea. OT has seen him today, will resume braces for his UE.   Acute respiratory failure -wean to off as tolerated 01/15/20 Resolved, undetermined cause. Per ID, no suspicion for pna.   Stage II sacral decubitus ulcer Pressure Injury 01/12/20 Buttocks Mid Stage 2 -  Partial thickness loss of dermis presenting as  a shallow open injury with a red, pink wound bed without slough. (Active)  01/12/20 1700  Location: Buttocks  Location Orientation: Mid  Staging: Stage 2 -  Partial  thickness loss of dermis presenting as a shallow open injury with a red, pink wound bed without slough.  Wound Description (Comments):   Present on Admission: Yes     Pressure Injury 01/12/20 Back Right;Upper;Bilateral Stage 2 -  Partial thickness loss of dermis presenting as a shallow open injury with a red, pink wound bed without slough. covered with yeast (Active)  01/12/20 1700  Location: Back  Location Orientation: Right;Upper;Bilateral  Staging: Stage 2 -  Partial thickness loss of dermis presenting as a shallow open injury with a red, pink wound bed without slough.  Wound Description (Comments): covered with yeast  Present on Admission: Yes   Constipation Reports not BM in 5 days. Denies abdominal pain or nausea.  Will start scheduled docusate and Miralax.   Hypomagnesmia -Magnesium goal> 2     DVT prophylaxis: Subcu heparin Code Status: Full Family Communication:  Status is: Inpatient    Dispo: The patient is from:               Anticipated d/c is to:               Anticipated d/c date is: 12/30              Patient currently unstable      Consultants:  Dr. Estrellita Ludwig [facility]rology via chat    Procedures/Significant Events:  12/19 US renal;Right Kidney: Renal measurements: 10.2 x 5.4 x 5.6 cm = volume: 162 mL. Several stones seen in the right kidney with the largest measuring 24 mm. No hydronephrosis. Left Kidney:Renal measurements: 11 x 6 x 5 cm = volume: 173.5 mL. Contains a 15 mm nonobstructive stone. Bladder:partially decompressed with a Foley catheter. 12/27 CT abdomen pelvis W0 contrast;Large right renal staghorn calculus nearly fully occupies the right renal collecting system. No evidence of ureteral calculus or hydronephrosis. -Small nonobstructing calculi in the lower pole of the left kidney. -Bilateral renal cortical scarring. -Ill-defined nodular pulmonary densities in the lingula and left lower lobe as well as more diffuse patchy ground-glass  opacities in both lower lobes, likely infectious/inflammatory.     I have personally reviewed and interpreted all radiology studies and my findings are as above.  VENTILATOR SETTINGS:    Cultures 12/19 blood positive Enterococcus faecalis, Staphylococcus, Proteus, Pseudomonas aeruginosa 12/19 blood LEFT hand positive Pseudomonas aeruginosa, Proteus Mirabilis 12/19 blood  positive Pseudomonas aeruginosa, Proteus Mirabilis, Enterococcus faecalis  12/19 urine positive Pseudomonas aeruginosa, Proteus Mirabilis 12/19 MRSA by PCR negative 12/21 blood LEFT hand negative final 12/21 blood LEFT forearm negative final 12/27 urine pending 12/27 blood pending    Antimicrobials: Anti-infectives (From admission, onward)   Start     Ordered Stop   01/23/20 0800  ciprofloxacin (CIPRO) tablet 500 mg        01/21/20 1235 02/02/20 0759   01/16/20 1400  piperacillin-tazobactam (ZOSYN) IVPB 3.375 g        01/16/20 1146 01/22/20 2359   01/14/20 1000  DAPTOmycin (CUBICIN) 620 mg in sodium chloride 0.9 % IVPB  Status:  Discontinued        01/14/20 0905 01/16/20 1146   01/14/20 0900  vancomycin (VANCOCIN) IVPB 1000 mg/200 mL premix  Status:  Discontinued        01/14/20 0734 01/14/20 0905   01/13/20 1300  ceFEPIme (MAXIPIME) 2 g in  sodium chloride 0.9 % 100 mL IVPB  Status:  Discontinued        01/12/20 1445 01/16/20 1146   01/12/20 1442  vancomycin variable dose per unstable renal function (pharmacist dosing)  Status:  Discontinued        01/12/20 1445 01/14/20 0736   01/12/20 1300  ceFEPIme (MAXIPIME) 2 g in sodium chloride 0.9 % 100 mL IVPB        01/12/20 1250 01/12/20 1347   01/12/20 1300  vancomycin (VANCOCIN) IVPB 1000 mg/200 mL premix  Status:  Discontinued        01/12/20 1250 01/12/20 1257   01/12/20 1300  vancomycin (VANCOREADY) IVPB 1750 mg/350 mL        01/12/20 1257 01/12/20 1550     Devices    LINES / TUBES:      Continuous Infusions: . piperacillin-tazobactam (ZOSYN)  IV  3.375 g (01/21/20 0505)     Objective: Vitals:   01/20/20 2025 01/21/20 0445 01/21/20 0500 01/21/20 0900  BP: 114/74 120/79  119/77  Pulse: 66 74  72  Resp: 16 17  17   Temp: 98.5 F (36.9 C) 97.8 F (36.6 C)  97.9 F (36.6 C)  TempSrc: Oral Oral  Oral  SpO2: 95% 98%  98%  Weight:   76 kg   Height:        Intake/Output Summary (Last 24 hours) at 01/21/2020 1141 Last data filed at 01/21/2020 1000 Gross per 24 hour  Intake 2009.92 ml  Output 2300 ml  Net -290.08 ml   Filed Weights   01/15/20 0500 01/19/20 0500 01/21/20 0500  Weight: 75.6 kg 76.1 kg 76 kg    Examination:  General: A/O x4, No acute respiratory distress Eyes: negative scleral hemorrhage, negative anisocoria, negative icterus ENT: Negative Runny nose, negative gingival bleeding, Neck:  Negative scars, masses, torticollis, lymphadenopathy, JVD Lungs: Clear to auscultation bilaterally without wheezes or crackles Cardiovascular: Regular rate and rhythm without murmur gallop or rub normal S1 and S2 Abdomen: negative abdominal pain, positive distention, positive soft, bowel sounds, no rebound, no ascites, no appreciable mass Extremities: contractures Skin: Sacral decubitus ulcer Psychiatric:  Negative depression, negative anxiety, negative fatigue, negative mania  Central nervous system:  Cranial nerves II through XII intact, tongue/uvula midline, negative dysarthria, negative expressive aphasia, negative receptive aphasia.  .     Data Reviewed: Care during the described time interval was provided by me .  I have reviewed this patient's available data, including medical history, events of note, physical examination, and all test results as part of my evaluation.  CBC: Recent Labs  Lab 01/15/20 0516 01/19/20 0115 01/20/20 0342 01/21/20 0154  WBC 8.4 12.5* 13.8* 16.3*  NEUTROABS  --  5.5 6.9 8.8*  HGB 11.6* 11.0* 12.1* 13.1  HCT 35.7* 35.8* 37.8* 40.7  MCV 89.7 91.3 90.9 90.2  PLT 202 308 323 376    Basic Metabolic Panel: Recent Labs  Lab 01/17/20 0310 01/18/20 0209 01/19/20 0115 01/20/20 0342 01/21/20 0154  NA 142 144 138 139 138  K 4.0 4.4 4.1 4.2 4.7  CL 107 106 105 105 102  CO2 26 26 26 25 25   GLUCOSE 105* 103* 131* 111* 100*  BUN 20 22* 22* 20 22*  CREATININE 1.23 1.05 1.16 1.14 1.14  CALCIUM 8.6* 9.4 8.8* 9.4 9.7  MG 1.7 1.9 1.8 2.0 2.1  PHOS  --  3.8 3.0 4.0 4.5   GFR: Estimated Creatinine Clearance: 72 mL/min (by C-G formula based on SCr of 1.14 mg/dL).  Liver Function Tests: Recent Labs  Lab 01/18/20 0209 01/19/20 0115 01/20/20 0342 01/21/20 0154  AST  --  45* 35 27  ALT  --  55* 60* 52*  ALKPHOS  --  58 66 65  BILITOT  --  0.6 <0.1* 0.6  PROT  --  5.9* 6.9 7.4  ALBUMIN 2.5* 2.4* 2.8* 2.9*   No results for input(s): LIPASE, AMYLASE in the last 168 hours. No results for input(s): AMMONIA in the last 168 hours. Coagulation Profile: No results for input(s): INR, PROTIME in the last 168 hours. Cardiac Enzymes: Recent Labs  Lab 01/15/20 0516  CKTOTAL 17*   BNP (last 3 results) No results for input(s): PROBNP in the last 8760 hours. HbA1C: No results for input(s): HGBA1C in the last 72 hours. CBG: Recent Labs  Lab 01/20/20 2027 01/21/20 0008 01/21/20 0443 01/21/20 0758 01/21/20 1118  GLUCAP 161* 118* 99 103* 153*   Lipid Profile: No results for input(s): CHOL, HDL, LDLCALC, TRIG, CHOLHDL, LDLDIRECT in the last 72 hours. Thyroid Function Tests: No results for input(s): TSH, T4TOTAL, FREET4, T3FREE, THYROIDAB in the last 72 hours. Anemia Panel: No results for input(s): VITAMINB12, FOLATE, FERRITIN, TIBC, IRON, RETICCTPCT in the last 72 hours. Sepsis Labs: No results for input(s): PROCALCITON, LATICACIDVEN in the last 168 hours.  Recent Results (from the past 240 hour(s))  Blood Culture (routine x 2)     Status: Abnormal   Collection Time: 01/12/20 12:56 PM   Specimen: BLOOD  Result Value Ref Range Status   Specimen Description BLOOD  SITE NOT SPECIFIED  Final   Special Requests   Final    BOTTLES DRAWN AEROBIC AND ANAEROBIC Blood Culture results may not be optimal due to an inadequate volume of blood received in culture bottles   Culture  Setup Time   Final    GRAM NEGATIVE RODS GRAM POSITIVE COCCI IN CHAINS IN BOTH AEROBIC AND ANAEROBIC BOTTLES GRAM POSITIVE COCCI IN CLUSTERS AEROBIC BOTTLE ONLY CRITICAL RESULT CALLED TO, READ BACK BY AND VERIFIED WITH: J. LEDFORD,PHARMD [id number] 01/13/2020 T. TYSOR    Culture (A)  Final    PROTEUS MIRABILIS PSEUDOMONAS AERUGINOSA ENTEROCOCCUS FAECALIS ORG 1 AND 2 SUSCEPTIBILITIES PERFORMED ON PREVIOUS CULTURE WITHIN THE LAST 5 DAYS. Performed at [facility], [address], [place], [place] [address]    Report Status 01/17/2020 FINAL  Final   Organism ID, Bacteria ENTEROCOCCUS FAECALIS  Final      Susceptibility   Enterococcus faecalis - MIC*    AMPICILLIN <=2 SENSITIVE Sensitive     VANCOMYCIN 1 SENSITIVE Sensitive     GENTAMICIN SYNERGY SENSITIVE Sensitive     * ENTEROCOCCUS FAECALIS  Blood Culture (routine x 2)     Status: Abnormal   Collection Time: 01/12/20 12:56 PM   Specimen: BLOOD LEFT HAND  Result Value Ref Range Status   Specimen Description BLOOD LEFT HAND  Final   Special Requests   Final    BOTTLES DRAWN AEROBIC AND ANAEROBIC Blood Culture adequate volume   Culture  Setup Time   Final    GRAM NEGATIVE RODS IN BOTH AEROBIC AND ANAEROBIC BOTTLES CRITICAL VALUE NOTED.  VALUE IS CONSISTENT WITH PREVIOUSLY REPORTED AND CALLED VALUE. Performed at [facility], [address], [place], [place] [address]    Culture PSEUDOMONAS AERUGINOSA PROTEUS MIRABILIS  (A)  Final   Report Status 01/16/2020 FINAL  Final   Organism ID, Bacteria PSEUDOMONAS AERUGINOSA  Final   Organism ID, Bacteria PROTEUS MIRABILIS  Final      Susceptibility   Pseudomonas aeruginosa - MIC*    CEFTAZIDIME 4 SENSITIVE Sensitive     CIPROFLOXACIN <=0.25 SENSITIVE Sensitive      GENTAMICIN 8 INTERMEDIATE Intermediate     IMIPENEM 2 SENSITIVE Sensitive     PIP/TAZO <=4 SENSITIVE Sensitive     CEFEPIME 4 SENSITIVE Sensitive     * PSEUDOMONAS AERUGINOSA   Proteus mirabilis - MIC*    AMPICILLIN 16 INTERMEDIATE Intermediate     CEFAZOLIN 8 SENSITIVE Sensitive     CEFEPIME 0.25 SENSITIVE Sensitive     CEFTAZIDIME <=1 SENSITIVE Sensitive     CEFTRIAXONE <=0.25 SENSITIVE Sensitive     CIPROFLOXACIN >=4 RESISTANT Resistant     GENTAMICIN <=1 SENSITIVE Sensitive     IMIPENEM >=16 RESISTANT Resistant     TRIMETH/SULFA >=320 RESISTANT Resistant     AMPICILLIN/SULBACTAM 8 SENSITIVE Sensitive     PIP/TAZO <=4 SENSITIVE Sensitive     * PROTEUS MIRABILIS  Urine culture     Status: Abnormal   Collection Time: 01/12/20 12:56 PM   Specimen: In/Out Cath Urine  Result Value Ref Range Status   Specimen Description IN/OUT CATH URINE  Final   Special Requests   Final    NONE Performed at [facility], [address], [place], [place] [address]    Culture (A)  Final    >=100,000 COLONIES/mL PROTEUS MIRABILIS >=100,000 COLONIES/mL PSEUDOMONAS AERUGINOSA    Report Status 01/17/2020 FINAL  Final   Organism ID, Bacteria PROTEUS MIRABILIS (A)  Final   Organism ID, Bacteria PSEUDOMONAS AERUGINOSA (A)  Final      Susceptibility   Pseudomonas aeruginosa - MIC*    CEFTAZIDIME 4 SENSITIVE Sensitive     CIPROFLOXACIN <=0.25 SENSITIVE Sensitive     GENTAMICIN 8 INTERMEDIATE Intermediate     IMIPENEM 1 SENSITIVE Sensitive     PIP/TAZO <=4 SENSITIVE Sensitive     * >=100,000 COLONIES/mL PSEUDOMONAS AERUGINOSA   Proteus mirabilis - MIC*    AMPICILLIN <=2 SENSITIVE Sensitive     CEFAZOLIN <=4 SENSITIVE Sensitive     CEFEPIME <=0.12 SENSITIVE Sensitive     CEFTRIAXONE <=0.25 SENSITIVE Sensitive     CIPROFLOXACIN <=0.25 SENSITIVE Sensitive     GENTAMICIN <=1 SENSITIVE Sensitive     IMIPENEM 2 SENSITIVE Sensitive     NITROFURANTOIN RESISTANT Resistant     TRIMETH/SULFA <=20  SENSITIVE Sensitive     AMPICILLIN/SULBACTAM <=2 SENSITIVE Sensitive     PIP/TAZO <=4 SENSITIVE Sensitive     * >=100,000 COLONIES/mL PROTEUS MIRABILIS  Resp Panel by RT-PCR (Flu A&B, Covid) Nasopharyngeal Swab     Status: None   Collection Time: 01/12/20 12:56 PM   Specimen: Nasopharyngeal Swab; Nasopharyngeal(NP) swabs in vial transport medium  Result Value Ref Range Status   SARS Coronavirus 2 by RT PCR NEGATIVE NEGATIVE Final    Comment: (NOTE) SARS-CoV-2 target nucleic acids are NOT DETECTED.  The SARS-CoV-2 RNA is generally detectable in upper respiratory specimens during the acute phase of infection. The lowest concentration of SARS-CoV-2 viral copies this assay can detect is 138 copies/mL. A negative result does not preclude SARS-Cov-2 infection and should not be used as the sole basis for treatment or other patient management decisions. A negative result may occur with  improper specimen collection/handling, submission of specimen other than nasopharyngeal swab, presence of viral mutation(s) within the areas targeted by this assay, and inadequate number of viral copies(<138 copies/mL). A negative result must be combined with clinical observations,  patient history, and epidemiological information. The expected result is Negative.  Fact Sheet for Patients:  BloggerCourse.com  Fact Sheet for Healthcare Providers:  SeriousBroker.it  This test is no t yet approved or cleared by the [place] FDA and  has been authorized for detection and/or diagnosis of SARS-CoV-2 by FDA under an Emergency Use Authorization (EUA). This EUA will remain  in effect (meaning this test can be used) for the duration of the COVID-19 declaration under Section 564(b)(1) of the Act, 21 U.S.C.section 360bbb-3(b)(1), unless the authorization is terminated  or revoked sooner.       Influenza A by PCR NEGATIVE NEGATIVE Final   Influenza B by PCR  NEGATIVE NEGATIVE Final    Comment: (NOTE) The Xpert Xpress SARS-CoV-2/FLU/RSV plus assay is intended as an aid in the diagnosis of influenza from Nasopharyngeal swab specimens and should not be used as a sole basis for treatment. Nasal washings and aspirates are unacceptable for Xpert Xpress SARS-CoV-2/FLU/RSV testing.  Fact Sheet for Patients: BloggerCourse.com  Fact Sheet for Healthcare Providers: SeriousBroker.it  This test is not yet approved or cleared by the [place] FDA and has been authorized for detection and/or diagnosis of SARS-CoV-2 by FDA under an Emergency Use Authorization (EUA). This EUA will remain in effect (meaning this test can be used) for the duration of the COVID-19 declaration under Section 564(b)(1) of the Act, 21 U.S.C. section 360bbb-3(b)(1), unless the authorization is terminated or revoked.  Performed at [facility], [address] [place], [place] [address]   Blood Culture ID Panel (Reflexed)     Status: Abnormal   Collection Time: 01/12/20 12:56 PM  Result Value Ref Range Status   Enterococcus faecalis DETECTED (A) NOT DETECTED Final    Comment: CRITICAL RESULT CALLED TO, READ BACK BY AND VERIFIED WITH: J. LEDFORD,PHARMD [id number] 01/13/2020 T. TYSOR    Enterococcus Faecium NOT DETECTED NOT DETECTED Final   Listeria monocytogenes NOT DETECTED NOT DETECTED Final   Staphylococcus species DETECTED (A) NOT DETECTED Final    Comment: CRITICAL RESULT CALLED TO, READ BACK BY AND VERIFIED WITH: J. LEDFORD,PHARMD [id number] 01/13/2020 T. TYSOR    Staphylococcus aureus (BCID) NOT DETECTED NOT DETECTED Final   Staphylococcus epidermidis NOT DETECTED NOT DETECTED Final   Staphylococcus lugdunensis NOT DETECTED NOT DETECTED Final   Streptococcus species NOT DETECTED NOT DETECTED Final   Streptococcus agalactiae NOT DETECTED NOT DETECTED Final   Streptococcus pneumoniae NOT DETECTED NOT DETECTED Final    Streptococcus pyogenes NOT DETECTED NOT DETECTED Final   A.calcoaceticus-baumannii NOT DETECTED NOT DETECTED Final   Bacteroides fragilis NOT DETECTED NOT DETECTED Final   Enterobacterales DETECTED (A) NOT DETECTED Final    Comment: Enterobacterales represent a large order of gram negative bacteria, not a single organism. CRITICAL RESULT CALLED TO, READ BACK BY AND VERIFIED WITH: J. LEDFORD,PHARMD [id number] 01/13/2020 T. TYSOR    Enterobacter cloacae complex NOT DETECTED NOT DETECTED Final   Escherichia coli NOT DETECTED NOT DETECTED Final   Klebsiella aerogenes NOT DETECTED NOT DETECTED Final   Klebsiella oxytoca NOT DETECTED NOT DETECTED Final   Klebsiella pneumoniae NOT DETECTED NOT DETECTED Final   Proteus species DETECTED (A) NOT DETECTED Final    Comment: CRITICAL RESULT CALLED TO, READ BACK BY AND VERIFIED WITH: J. LEDFORD,PHARMD [id number] 01/13/2020 T. TYSOR    Salmonella species NOT DETECTED NOT DETECTED Final   Serratia marcescens NOT DETECTED NOT DETECTED Final   Haemophilus influenzae NOT DETECTED NOT DETECTED Final   Neisseria meningitidis NOT DETECTED NOT  DETECTED Final   Pseudomonas aeruginosa DETECTED (A) NOT DETECTED Final    Comment: CRITICAL RESULT CALLED TO, READ BACK BY AND VERIFIED WITH: J. LEDFORD,PHARMD [id number] 01/13/2020 T. TYSOR    Stenotrophomonas maltophilia NOT DETECTED NOT DETECTED Final   Candida albicans NOT DETECTED NOT DETECTED Final   Candida auris NOT DETECTED NOT DETECTED Final   Candida glabrata NOT DETECTED NOT DETECTED Final   Candida krusei NOT DETECTED NOT DETECTED Final   Candida parapsilosis NOT DETECTED NOT DETECTED Final   Candida tropicalis NOT DETECTED NOT DETECTED Final   Cryptococcus neoformans/gattii NOT DETECTED NOT DETECTED Final   CTX-M ESBL NOT DETECTED NOT DETECTED Final   Carbapenem resistance IMP NOT DETECTED NOT DETECTED Final   Carbapenem resistance KPC NOT DETECTED NOT DETECTED Final   Carbapenem resistance NDM NOT DETECTED NOT  DETECTED Final   Carbapenem resist OXA 48 LIKE NOT DETECTED NOT DETECTED Final   Vancomycin resistance NOT DETECTED NOT DETECTED Final   Carbapenem resistance VIM NOT DETECTED NOT DETECTED Final    Comment: Performed at [facility], [address] [place], [place] [address]  MRSA PCR Screening     Status: None   Collection Time: 01/12/20  9:10 PM   Specimen: Nasal Mucosa; Nasopharyngeal  Result Value Ref Range Status   MRSA by PCR NEGATIVE NEGATIVE Final    Comment:        The GeneXpert MRSA Assay (FDA approved for NASAL specimens only), is one component of a comprehensive MRSA colonization surveillance program. It is not intended to diagnose MRSA infection nor to guide or monitor treatment for MRSA infections. Performed at [facility], [address], [place], [place] [address]   Culture, blood (routine x 2)     Status: None   Collection Time: 01/14/20 10:16 AM   Specimen: BLOOD LEFT HAND  Result Value Ref Range Status   Specimen Description BLOOD LEFT HAND  Final   Special Requests   Final    BOTTLES DRAWN AEROBIC AND ANAEROBIC Blood Culture adequate volume   Culture   Final    NO GROWTH 5 DAYS Performed at [facility], [address], [place], [place] [address]    Report Status 01/19/2020 FINAL  Final  Culture, blood (routine x 2)     Status: None   Collection Time: 01/14/20 10:21 AM   Specimen: BLOOD LEFT FOREARM  Result Value Ref Range Status   Specimen Description BLOOD LEFT FOREARM  Final   Special Requests   Final    BOTTLES DRAWN AEROBIC AND ANAEROBIC Blood Culture adequate volume   Culture   Final    NO GROWTH 5 DAYS Performed at [facility], [address], [place], [place] [address]    Report Status 01/19/2020 FINAL  Final  Culture, Urine     Status: None   Collection Time: 01/20/20  3:33 AM   Specimen: Urine, Catheterized  Result Value Ref Range Status   Specimen Description URINE, CATHETERIZED  Final   Special Requests NONE   Final   Culture   Final    NO GROWTH Performed at [facility], [address] [place], [place] [address]    Report Status 01/21/2020 FINAL  Final         Radiology Studies: CT ABDOMEN PELVIS WO CONTRAST  Result Date: 01/20/2020 CLINICAL DATA:  Bladder neck obstruction. Evaluate for ureteral calculus. EXAM: CT ABDOMEN AND PELVIS WITHOUT CONTRAST TECHNIQUE: Multidetector CT imaging of the abdomen  and pelvis was performed following the standard protocol without IV contrast. COMPARISON:  Renal ultrasound 01/12/2020, abdominal radiographs 02/12/2015 and CT 03/03/2004. FINDINGS: Lower chest: There are ill-defined nodular densities within the lingula and left lower lobe as well as more diffuse patchy ground-glass opacities in both lower lobes, likely infectious/inflammatory. No significant pleural or pericardial effusion. There is mild aortic atherosclerosis. Hepatobiliary: No focal hepatic abnormalities on noncontrast imaging. The gallbladder is incompletely distended. No evidence of gallstones, gallbladder wall thickening or biliary dilatation. Pancreas: Unremarkable. No pancreatic ductal dilatation or surrounding inflammatory changes. Spleen: Normal in size without focal abnormality. Adrenals/Urinary Tract: Both adrenal glands appear normal. There is a large right renal staghorn calculus which nearly fully occupies the right renal collecting system, extending 7.0 cm in length on sagittal image 59/7. There are small nonobstructing calculi in the lower pole of the left kidney, measuring up to 6 mm in diameter. No significant ureteral dilatation, periureteral inflammation or ureteral calculus. Both kidneys demonstrate underlying cortical scarring. A Foley catheter is in place, accounting for gas in the bladder lumen. No evidence of bladder wall thickening or bladder calculus. Stomach/Bowel: No evidence of bowel wall thickening, distention or surrounding inflammatory change. The appendix appears  normal. There is prominent stool in the rectum. Vascular/Lymphatic: There are no enlarged abdominal or pelvic lymph nodes. There are scattered small retroperitoneal lymph nodes. Mild aortic and branch vessel atherosclerosis. Reproductive: The prostate gland and seminal vesicles appear normal. Other: No evidence of abdominal wall mass or hernia. No ascites. Musculoskeletal: No acute or significant osseous findings. Mild degenerative changes in the lower lumbar spine associated with a minimal convex right scoliosis. Mild degenerative changes at the hips. IMPRESSION: 1. Large right renal staghorn calculus nearly fully occupies the right renal collecting system. No evidence of ureteral calculus or hydronephrosis. 2. Small nonobstructing calculi in the lower pole of the left kidney. 3. Bilateral renal cortical scarring. 4. Ill-defined nodular pulmonary densities in the lingula and left lower lobe as well as more diffuse patchy ground-glass opacities in both lower lobes, likely infectious/inflammatory. Recommend radiographic follow-up. 5. Aortic Atherosclerosis (ICD10-I70.0). Electronically Signed   By: Carey Bullocks M.D.   On: 01/20/2020 12:55        Scheduled Meds: . Chlorhexidine Gluconate Cloth  6 each Topical Daily  . docusate sodium  100 mg Oral BID  . feeding supplement  237 mL Oral TID WC  . heparin  5,000 Units Subcutaneous Q8H  . insulin aspart  0-9 Units Subcutaneous Q4H  . multivitamin with minerals  1 tablet Oral Daily  . polyethylene glycol  17 g Oral Daily  . polyvinyl alcohol  2 drop Both Eyes Q4H   Continuous Infusions: . piperacillin-tazobactam (ZOSYN)  IV 3.375 g (01/21/20 0505)     LOS: 9 days    Time spent:40 min    Kerrigan Glendening, Roselind Messier, MD [facility] Pager [phone]  If 7PM-7AM, please contact night-coverage [email or web address] Password [facility] 01/21/2020, 11:41 AM

## 2020-01-21 NOTE — Progress Notes (Signed)
Physical Therapy Treatment Patient Details Name: PAMELA MADDY MRN: [id number] DOB: [birth year] Today's Date: 01/21/2020    History of Present Illness 59 Y O Male from NH with PMH of MS, GERD, HTN, quadriplegic, bedbound with decubitus ulcer and indwelling foleys catheter who presented to the ED 12/19 with complaints of nausea and vomiting for 2 days. He also had leaking around his Foleys. And complained of some SOB and nonproductive cough during admission. Found to be septic and admitted for treatment of septic shock.    PT Comments    Patient reports feeling well today. Tolerated PROM/stretching to all joints/extremities. Noted to have fixed contractures to elbows and thumbs and tightness especially at bil hips and left knee. Rn requested pt not be transferred to chair during session due to wanting to give him miralax to have a BM. Recommend nursing transfer pt to chair via lift later to reposition and get OOB. Pt is functioning at baseline and is total care for ADLs and all mobility. Defer care to SNF. Does not have acute therapy needs at this time. Discharge from therapy.   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  Transfers                    Ambulation/Gait                 Stairs             Wheelchair Mobility    Modified Rankin (Stroke Patients Only)       Balance                                            Cognition Arousal/Alertness: Awake/alert Behavior During Therapy: Flat affect Overall Cognitive  Status: No family/caregiver present to determine baseline cognitive functioning                                 General Comments: Follows commands; appears [facility] for basic tasks/questions however did not fully assess. Oriented to place, date and situation. Flat affect, soft spoken      Exercises General Exercises - Upper Extremity Elbow Flexion: PROM;Both;5 reps Elbow Extension: PROM;Both;5 reps Wrist Flexion: PROM;Both;5 reps Wrist Extension: PROM;Both;5 reps Digit Composite Flexion: PROM;Both;5 reps Composite Extension: PROM;Both;5 reps General Exercises - Lower Extremity Ankle Circles/Pumps: PROM;Both;10 reps;Supine (sustained clonus) Heel Slides: PROM;Both;15 reps;Supine Hip ABduction/ADduction: PROM;Both;15 reps;Supine Hip Flexion/Marching: PROM;Both;15 reps;Supine Other Exercises Other Exercises: Passive stretch performed bil. elbows into extension - fixed contractures noted.  PROM of bil. wrist and hands performed. bil. thumbs with fixed contractures Other Exercises: hip IR/ER BLEs x15 bilaterally; very tight    General Comments General comments (skin integrity, edema, etc.): Pt attempted to contact daughter via phone during session but no answer (OT left VM), sister answered but was busy at MD  office. Collaborated with pt and pt able to identify similarities of braces for B UE with pictures. Educated on skin integrity and best positioning of joints to allow skin to breathe.Difficulty with voice controls using Siri, OT attempting to assist pt to improve ability to communicate with family/QOL      Pertinent Vitals/Pain Pain Assessment: Faces Faces Pain Scale: Hurts a little bit Pain Location: grimacing with PROM of extremities Pain Descriptors / Indicators: Grimacing Pain Intervention(s): Monitored during session;Repositioned    Home Living                      Prior Function            PT Goals (current goals can now be found in the care plan  section) Acute Rehab PT Goals Patient Stated Goal: wear braces daily for UE's and have LE boots in proper position. Progress towards PT goals: Goals met/education completed, patient discharged from PT    Frequency    Min 2X/week      PT Plan Current plan remains appropriate    Co-evaluation              AM-PAC PT "6 Clicks" Mobility   Outcome Measure  Help needed turning from your back to your side while in a flat bed without using bedrails?: Total Help needed moving from lying on your back to sitting on the side of a flat bed without using bedrails?: Total Help needed moving to and from a bed to a chair (including a wheelchair)?: Total Help needed standing up from a chair using your arms (e.g., wheelchair or bedside chair)?: Total Help needed to walk in hospital room?: Total Help needed climbing 3-5 steps with a railing? : Total 6 Click Score: 6    End of Session   Activity Tolerance: Patient tolerated treatment well Patient left: in bed;with call bell/phone within reach Nurse Communication: Mobility status;Need for lift equipment PT Visit Diagnosis: Muscle weakness (generalized) (M62.81);Difficulty in walking, not elsewhere classified (R26.2);Other symptoms and signs involving the nervous system (R29.898)     Time: 2595-6387 PT Time Calculation (min) (ACUTE ONLY): 18 min  Charges:  $Therapeutic Exercise: 8-22 mins                     Marisa Severin, PT, DPT Acute Rehabilitation Services Pager [phone] Office [phone]       Marguarite Arbour A Sabra Heck 01/21/2020, 3:00 PM

## 2020-01-21 NOTE — TOC Initial Note (Signed)
Transition of Care [facility]) - Initial/Assessment Note    Patient Details  Name: Travis Cantrell MRN: [id number] Date of Birth: 1960/02/06  [facility] CM/SW Contact:    Emeterio Reeve, [place] Phone Number: 01/21/2020, 4:52 PM  Clinical Narrative:                  CSW met with pt at bedside. CSW introduced self and explained her role at the hospital.  Pt reports he is short term at [facility] but has been there 2-3 months. Pt reports he has difficulty walking and that he gets assistance with ADL's at the facility. Pt reports he would like to go to a different SNF. Pt stated he was not reciving therapy at facility.  CSW spoke to loy at [facility] who states pt can for SNF May 2021, and became a long term resident in June 2021. Pt has medicaid and has used all of his 100% snf days. Pt was not receiving therapy before admission but would get some due to contractures.   TOC will follow up.     Expected Discharge Plan: Long Term Nursing Home Barriers to Discharge: Continued Medical Work up   Patient Goals and CMS Choice Patient states their goals for this hospitalization and ongoing recovery are:: to get better CMS Medicare.gov Compare Post Acute Care list provided to:: Patient Choice offered to / list presented to : Patient  Expected Discharge Plan and Services Expected Discharge Plan: [place]       Living arrangements for the past 2 months: [place]                                      Prior Living Arrangements/Services Living arrangements for the past 2 months: [place] Lives with:: Facility Resident Patient language and need for interpreter reviewed:: Yes Do you feel safe going back to the place where you live?: Yes      Need for Family Participation in Patient Care: Yes (Comment) Care giver support system in place?: No (comment)   Criminal Activity/Legal Involvement Pertinent to Current  Situation/Hospitalization: No - Comment as needed  Activities of Daily Living Home Assistive Devices/Equipment: None ADL Screening (condition at time of admission) Patient's cognitive ability adequate to safely complete daily activities?: Yes Is the patient deaf or have difficulty hearing?: No Does the patient have difficulty seeing, even when wearing glasses/contacts?: No Does the patient have difficulty concentrating, remembering, or making decisions?: No Patient able to express need for assistance with ADLs?: Yes Does the patient have difficulty dressing or bathing?: Yes Independently performs ADLs?: No Communication: Independent Does the patient have difficulty walking or climbing stairs?: Yes Weakness of Legs: Both Weakness of Arms/Hands: Both  Permission Sought/Granted Permission sought to share information with : Chartered certified accountant granted to share information with : Yes, Verbal Permission Granted     Permission granted to share info w AGENCY: snf        Emotional Assessment Appearance:: Appears older than stated age Attitude/Demeanor/Rapport: Engaged Affect (typically observed): Appropriate Orientation: : Oriented to Self,Oriented to Place,Oriented to  Time,Oriented to Situation Alcohol / Substance Use: Not Applicable Psych Involvement: No (comment)  Admission diagnosis:  Lactic acidosis [E87.2] AKI (acute kidney injury) ([place]) [N17.9] Septic shock ([place]) [A41.9, R65.21] Acute renal failure, unspecified acute renal failure type ([place]) [N17.9] Patient Active Problem List   Diagnosis Date Noted  .  Acute bacterial pyelonephritis 01/21/2020  . Malnutrition of moderate degree 01/17/2020  . Pressure injury of skin 01/13/2020  . Septic shock ([place]) 01/12/2020  . Decreased ROM of left elbow 06/19/2019  . Contracture of joint of multiple sites 06/19/2019  . Acute pain of left shoulder 06/19/2019  . 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 Trigeminal  neuralgia of left side of face 03/20/2018  . MDD (major depressive disorder), recurrent episode, moderate ([place]) 12/01/2017  . Contracture of muscle, right upper arm 12/23/2016  . Neurogenic bladder 10/28/2016  . History of decubitus ulcer 03/31/2016  . Left-sided face pain 02/05/2016  . Recurrent UTI 04/16/2015  . Essential hypertension, benign 11/19/2013  . Seborrheic dermatitis 12/06/2010  . Presence of indwelling urinary catheter 12/06/2010  . High cholesterol 05/25/2010  . Prediabetes 12/25/2009  . ONYCHOMYCOSIS, BILATERAL 09/25/2009  . CONSTIPATION 04/29/2008  . INSOMNIA, CHRONIC 08/29/2007  . Multiple sclerosis, primary progressive ([place]) 08/29/2007  . ALLERGIC RHINITIS 08/29/2007  . GERD 08/29/2007  . 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 [place] [address] Phone: [phone] Fax: [phone]     Social Determinants of Health (SDOH) Interventions    Readmission Risk Interventions No flowsheet data found.  Emeterio Reeve, Latanya Presser, [place] Social Worker [phone]

## 2020-01-21 NOTE — Plan of Care (Signed)
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
## 2020-01-22 DIAGNOSIS — N179 Acute kidney failure, unspecified: Secondary | ICD-10-CM | POA: Diagnosis not present

## 2020-01-22 DIAGNOSIS — B9689 Other specified bacterial agents as the cause of diseases classified elsewhere: Secondary | ICD-10-CM | POA: Diagnosis not present

## 2020-01-22 DIAGNOSIS — N10 Acute tubulo-interstitial nephritis: Secondary | ICD-10-CM

## 2020-01-22 DIAGNOSIS — E440 Moderate protein-calorie malnutrition: Secondary | ICD-10-CM | POA: Diagnosis not present

## 2020-01-22 LAB — CBC WITH DIFFERENTIAL/PLATELET
Abs Immature Granulocytes: 0.87 10*3/uL — ABNORMAL HIGH (ref 0.00–0.07)
Basophils Absolute: 0.1 10*3/uL (ref 0.0–0.1)
Basophils Relative: 1 %
Eosinophils Absolute: 0.5 10*3/uL (ref 0.0–0.5)
Eosinophils Relative: 3 %
HCT: 44.8 % (ref 39.0–52.0)
Hemoglobin: 13.8 g/dL (ref 13.0–17.0)
Immature Granulocytes: 5 %
Lymphocytes Relative: 28 %
Lymphs Abs: 5 10*3/uL — ABNORMAL HIGH (ref 0.7–4.0)
MCH: 28 pg (ref 26.0–34.0)
MCHC: 30.8 g/dL (ref 30.0–36.0)
MCV: 90.9 fL (ref 80.0–100.0)
Monocytes Absolute: 1.3 10*3/uL — ABNORMAL HIGH (ref 0.1–1.0)
Monocytes Relative: 7 %
Neutro Abs: 10.3 10*3/uL — ABNORMAL HIGH (ref 1.7–7.7)
Neutrophils Relative %: 56 %
Platelets: 400 10*3/uL (ref 150–400)
RBC: 4.93 MIL/uL (ref 4.22–5.81)
RDW: 15.2 % (ref 11.5–15.5)
WBC: 18.1 10*3/uL — ABNORMAL HIGH (ref 4.0–10.5)
nRBC: 0 % (ref 0.0–0.2)

## 2020-01-22 LAB — COMPREHENSIVE METABOLIC PANEL
ALT: 43 U/L (ref 0–44)
AST: 22 U/L (ref 15–41)
Albumin: 3.1 g/dL — ABNORMAL LOW (ref 3.5–5.0)
Alkaline Phosphatase: 72 U/L (ref 38–126)
Anion gap: 12 (ref 5–15)
BUN: 27 mg/dL — ABNORMAL HIGH (ref 6–20)
CO2: 24 mmol/L (ref 22–32)
Calcium: 9.9 mg/dL (ref 8.9–10.3)
Chloride: 100 mmol/L (ref 98–111)
Creatinine, Ser: 1.25 mg/dL — ABNORMAL HIGH (ref 0.61–1.24)
GFR, Estimated: >60 mL/min (ref >60)
Glucose, Bld: 124 mg/dL — ABNORMAL HIGH (ref 70–99)
Potassium: 5.1 mmol/L (ref 3.5–5.1)
Sodium: 136 mmol/L (ref 135–145)
Total Bilirubin: 0.5 mg/dL (ref 0.3–1.2)
Total Protein: 7.5 g/dL (ref 6.5–8.1)

## 2020-01-22 LAB — GLUCOSE, CAPILLARY
Glucose-Capillary: 120 mg/dL — ABNORMAL HIGH (ref 70–99)
Glucose-Capillary: 130 mg/dL — ABNORMAL HIGH (ref 70–99)
Glucose-Capillary: 167 mg/dL — ABNORMAL HIGH (ref 70–99)
Glucose-Capillary: 141 mg/dL — ABNORMAL HIGH (ref 70–99)
Glucose-Capillary: 136 mg/dL — ABNORMAL HIGH (ref 70–99)

## 2020-01-22 LAB — PHOSPHORUS: Phosphorus: 4.6 mg/dL (ref 2.5–4.6)

## 2020-01-22 LAB — MAGNESIUM: Magnesium: 2.3 mg/dL (ref 1.7–2.4)

## 2020-01-22 MED ORDER — INSULIN ASPART 100 UNIT/ML ~~LOC~~ SOLN: 0.0000 [IU] | Freq: Every day | SUBCUTANEOUS | Status: DC

## 2020-01-22 MED ORDER — INSULIN ASPART 100 UNIT/ML ~~LOC~~ SOLN
0.0000 [IU] | Freq: Three times a day (TID) | SUBCUTANEOUS | Status: DC
  Administered 2020-01-23 (×2): 1 [IU] via SUBCUTANEOUS
  Administered 2020-01-23: 13:00:00 2 [IU] via SUBCUTANEOUS
  Administered 2020-01-24: 1 [IU] via SUBCUTANEOUS

## 2020-01-22 NOTE — Plan of Care (Signed)

## 2020-01-22 NOTE — Progress Notes (Signed)
Travis Cantrell  EAV:[id number] DOB: 10-08-1960 DOA: 01/12/2020 PCP: Excell Seltzer, MD    Brief Narrative:  59 year old SNF resident with history of quadriplegia, multiple sclerosis, decubitus ulcers, and chronic indwelling Foley catheter who was admitted with 2 days of severe nausea and vomiting and found to be in septic shock. It was discovered that his Foley catheter was clogged. This was able to be replaced in the ED. The patient briefly required vasopressor support and was admitted by PCCM. He was transferred to [facility] 12/21.  Significant Events:  12/19 admit via ED from SNF by PCCM 12/21 transfer to [facility]  Antimicrobials:  Zosyn 12/23 > Cefepime 12/19 > 12/22 Cubicin 12/21 > 12/22  DVT prophylaxis: Subcutaneous heparin  Subjective: Vital signs stable. Afebrile. 97% on room air.  Tolerating diet well with assistance.  Denies new complaints.  Reports to me that he is interested in obtaining a new facility in which to live  Assessment & Plan:  Polymicrobial bacteremia with complicated pyelonephritis and septic shock Shock now resolved -ID following -to continue antibiotics through 12/29  Neurogenic bladder  - chronic indwelling Foley folye should be changed every 30 days per urology  Acute kidney injury Due to septic shock and obstruction (blocked Foley) -Foley replaced -has been counseled on it and will consider eventual suprapubic catheter  Nephrolithiasis -right staghorn calculus Evaluated by urology -to continue 10 additional days of antibiotics in outpatient setting due to high risk of recurrence of pyelonephritis -will likely need eventual surgical intervention -to follow-up with urology 1 to 2 weeks post discharge -[facility] been confirmed for February 10, 2020 at 2 PM  Quadriparesis -debilitating multiple sclerosis Lives in SNF  Constipation Continue bowel regimen  Hypomagnesemia  Decubitus ulcer -present on admission Pressure  Injury 01/12/20 Buttocks Mid Stage 2 -  Partial thickness loss of dermis presenting as a shallow open injury with a red, pink wound bed without slough. (Active)  01/12/20 1700  Location: Buttocks  Location Orientation: Mid  Staging: Stage 2 -  Partial thickness loss of dermis presenting as a shallow open injury with a red, pink wound bed without slough.  Wound Description (Comments):   Present on Admission: Yes     Pressure Injury 01/12/20 Back Right;Upper;Bilateral Stage 2 -  Partial thickness loss of dermis presenting as a shallow open injury with a red, pink wound bed without slough. covered with yeast (Active)  01/12/20 1700  Location: Back  Location Orientation: Right;Upper;Bilateral  Staging: Stage 2 -  Partial thickness loss of dermis presenting as a shallow open injury with a red, pink wound bed without slough.  Wound Description (Comments): covered with yeast  Present on Admission: Yes    Code Status: FULL CODE Family Communication:  Status is: Inpatient  Remains inpatient appropriate because:IV treatments appropriate due to intensity of illness or inability to take PO   Dispo: The patient is from: SNF              Anticipated d/c is to: SNF              Anticipated d/c date is: 1 day              Patient currently is not medically stable to d/c.   Consultants:  none  Objective: Blood pressure 104/81, pulse 92, temperature 97.9 F (36.6 C), temperature source Oral, resp. rate 20, height 5\' 10"  (1.778 m), weight 75.3 kg, SpO2 97 %.  Intake/Output Summary (Last 24 hours)  at 01/22/2020 1023 Last data filed at 01/22/2020 0829 Gross per 24 hour  Intake 1009.21 ml  Output 1100 ml  Net -90.79 ml   Filed Weights   01/19/20 0500 01/21/20 0500 01/22/20 0500  Weight: 76.1 kg 76 kg 75.3 kg    Examination: General: No acute respiratory distress Lungs: Clear to auscultation bilaterally without wheezes or crackles Cardiovascular: Regular rate and rhythm without murmur  gallop or rub normal S1 and S2 Abdomen: Nontender, nondistended, soft, bowel sounds positive, no rebound, no ascites, no appreciable mass Extremities: No significant cyanosis, clubbing, or edema bilateral lower extremities  CBC: Recent Labs  Lab 01/20/20 0342 01/21/20 0154 01/22/20 0210  WBC 13.8* 16.3* 18.1*  NEUTROABS 6.9 8.8* 10.3*  HGB 12.1* 13.1 13.8  HCT 37.8* 40.7 44.8  MCV 90.9 90.2 90.9  PLT 323 376 400   Basic Metabolic Panel: Recent Labs  Lab 01/20/20 0342 01/21/20 0154 01/22/20 0210  NA 139 138 136  K 4.2 4.7 5.1  CL 105 102 100  CO2 GLUCOSE 111* 100* 124*  BUN 20 22* 27*  CREATININE 1.14 1.14 1.25*  CALCIUM 9.4 9.7 9.9  MG 2.0 2.1 2.3  PHOS 4.0 4.5 4.6   GFR: Estimated Creatinine Clearance: 65.7 mL/min (A) (by C-G formula based on SCr of 1.25 mg/dL (H)).  Liver Function Tests: Recent Labs  Lab 01/19/20 0115 01/20/20 0342 01/21/20 0154 01/22/20 0210  AST 45* 35 27 22  ALT 55* 60* 52* 43  ALKPHOS 58 66 65 72  BILITOT 0.6 <0.1* 0.6 0.5  PROT 5.9* 6.9 7.4 7.5  ALBUMIN 2.4* 2.8* 2.9* 3.1*    HbA1C: Hgb A1c MFr Bld  Date/Time Value Ref Range Status  01/12/2020 03:21 PM 5.3 4.8 - 5.6 % Final    Comment:    (NOTE) Pre diabetes:          5.7%-6.4%  Diabetes:              >6.4%  Glycemic control for   <7.0% adults with diabetes   12/01/2017 11:46 AM 5.0 4.6 - 6.5 % Final    Comment:    Glycemic Control Guidelines for People with Diabetes:Non Diabetic:  <6%Goal of Therapy: <7%Additional Action Suggested:  >8%     CBG: Recent Labs  Lab 01/21/20 1619 01/21/20 1947 01/21/20 2358 01/22/20 0347 01/22/20 0744  GLUCAP 140* 149* 136* 120* 130*    Recent Results (from the past 240 hour(s))  Blood Culture (routine x 2)     Status: Abnormal   Collection Time: 01/12/20 12:56 PM   Specimen: BLOOD  Result Value Ref Range Status   Specimen Description BLOOD SITE NOT SPECIFIED  Final   Special Requests   Final    BOTTLES DRAWN  AEROBIC AND ANAEROBIC Blood Culture results may not be optimal due to an inadequate volume of blood received in culture bottles   Culture  Setup Time   Final    GRAM NEGATIVE RODS GRAM POSITIVE COCCI IN CHAINS IN BOTH AEROBIC AND ANAEROBIC BOTTLES GRAM POSITIVE COCCI IN CLUSTERS AEROBIC BOTTLE ONLY CRITICAL RESULT CALLED TO, READ BACK BY AND VERIFIED WITH: J. LEDFORD,PHARMD 1610 01/13/2020 T. TYSOR    Culture (A)  Final    PROTEUS MIRABILIS PSEUDOMONAS AERUGINOSA ENTEROCOCCUS FAECALIS ORG 1 AND 2 SUSCEPTIBILITIES PERFORMED ON PREVIOUS CULTURE WITHIN THE LAST 5 DAYS. Performed at [facility] Lab, [address] [place], [place] [address]    Report Status 01/17/2020 FINAL  Final   Organism ID,  Bacteria ENTEROCOCCUS FAECALIS  Final      Susceptibility   Enterococcus faecalis - MIC*    AMPICILLIN <=2 SENSITIVE Sensitive     VANCOMYCIN 1 SENSITIVE Sensitive     GENTAMICIN SYNERGY SENSITIVE Sensitive     * ENTEROCOCCUS FAECALIS  Blood Culture (routine x 2)     Status: Abnormal   Collection Time: 01/12/20 12:56 PM   Specimen: BLOOD LEFT HAND  Result Value Ref Range Status   Specimen Description BLOOD LEFT HAND  Final   Special Requests   Final    BOTTLES DRAWN AEROBIC AND ANAEROBIC Blood Culture adequate volume   Culture  Setup Time   Final    GRAM NEGATIVE RODS IN BOTH AEROBIC AND ANAEROBIC BOTTLES CRITICAL VALUE NOTED.  VALUE IS CONSISTENT WITH PREVIOUSLY REPORTED AND CALLED VALUE. Performed at [facility] Lab, [address] [place], [place] [address]    Culture PSEUDOMONAS AERUGINOSA PROTEUS MIRABILIS  (A)  Final   Report Status 01/16/2020 FINAL  Final   Organism ID, Bacteria PSEUDOMONAS AERUGINOSA  Final   Organism ID, Bacteria PROTEUS MIRABILIS  Final      Susceptibility   Pseudomonas aeruginosa - MIC*    CEFTAZIDIME 4 SENSITIVE Sensitive     CIPROFLOXACIN <=0.25 SENSITIVE Sensitive     GENTAMICIN 8 INTERMEDIATE Intermediate     IMIPENEM 2 SENSITIVE Sensitive      PIP/TAZO <=4 SENSITIVE Sensitive     CEFEPIME 4 SENSITIVE Sensitive     * PSEUDOMONAS AERUGINOSA   Proteus mirabilis - MIC*    AMPICILLIN 16 INTERMEDIATE Intermediate     CEFAZOLIN 8 SENSITIVE Sensitive     CEFEPIME 0.25 SENSITIVE Sensitive     CEFTAZIDIME <=1 SENSITIVE Sensitive     CEFTRIAXONE <=0.25 SENSITIVE Sensitive     CIPROFLOXACIN >=4 RESISTANT Resistant     GENTAMICIN <=1 SENSITIVE Sensitive     IMIPENEM >=16 RESISTANT Resistant     TRIMETH/SULFA >=320 RESISTANT Resistant     AMPICILLIN/SULBACTAM 8 SENSITIVE Sensitive     PIP/TAZO <=4 SENSITIVE Sensitive     * PROTEUS MIRABILIS  Urine culture     Status: Abnormal   Collection Time: 01/12/20 12:56 PM   Specimen: In/Out Cath Urine  Result Value Ref Range Status   Specimen Description IN/OUT CATH URINE  Final   Special Requests   Final    NONE Performed at [facility] Lab, [address], [place], [place] [address]    Culture (A)  Final    >=100,000 COLONIES/mL PROTEUS MIRABILIS >=100,000 COLONIES/mL PSEUDOMONAS AERUGINOSA    Report Status 01/17/2020 FINAL  Final   Organism ID, Bacteria PROTEUS MIRABILIS (A)  Final   Organism ID, Bacteria PSEUDOMONAS AERUGINOSA (A)  Final      Susceptibility   Pseudomonas aeruginosa - MIC*    CEFTAZIDIME 4 SENSITIVE Sensitive     CIPROFLOXACIN <=0.25 SENSITIVE Sensitive     GENTAMICIN 8 INTERMEDIATE Intermediate     IMIPENEM 1 SENSITIVE Sensitive     PIP/TAZO <=4 SENSITIVE Sensitive     * >=100,000 COLONIES/mL PSEUDOMONAS AERUGINOSA   Proteus mirabilis - MIC*    AMPICILLIN <=2 SENSITIVE Sensitive     CEFAZOLIN <=4 SENSITIVE Sensitive     CEFEPIME <=0.12 SENSITIVE Sensitive     CEFTRIAXONE <=0.25 SENSITIVE Sensitive     CIPROFLOXACIN <=0.25 SENSITIVE Sensitive     GENTAMICIN <=1 SENSITIVE Sensitive     IMIPENEM 2 SENSITIVE Sensitive     NITROFURANTOIN RESISTANT Resistant     TRIMETH/SULFA <=20  SENSITIVE Sensitive     AMPICILLIN/SULBACTAM <=2 SENSITIVE Sensitive      PIP/TAZO <=4 SENSITIVE Sensitive     * >=100,000 COLONIES/mL PROTEUS MIRABILIS  Resp Panel by RT-PCR (Flu A&B, Covid) Nasopharyngeal Swab     Status: None   Collection Time: 01/12/20 12:56 PM   Specimen: Nasopharyngeal Swab; Nasopharyngeal(NP) swabs in vial transport medium  Result Value Ref Range Status   SARS Coronavirus 2 by RT PCR NEGATIVE NEGATIVE Final    Comment: (NOTE) SARS-CoV-2 target nucleic acids are NOT DETECTED.  The SARS-CoV-2 RNA is generally detectable in upper respiratory specimens during the acute phase of infection. The lowest concentration of SARS-CoV-2 viral copies this assay can detect is 138 copies/mL. A negative result does not preclude SARS-Cov-2 infection and should not be used as the sole basis for treatment or other patient management decisions. A negative result may occur with  improper specimen collection/handling, submission of specimen other than nasopharyngeal swab, presence of viral mutation(s) within the areas targeted by this assay, and inadequate number of viral copies(<138 copies/mL). A negative result must be combined with clinical observations, patient history, and epidemiological information. The expected result is Negative.  Fact Sheet for Patients:  BloggerCourse.com  Fact Sheet for Healthcare Providers:  SeriousBroker.it  This test is no t yet approved or cleared by the [place] FDA and  has been authorized for detection and/or diagnosis of SARS-CoV-2 by FDA under an Emergency Use Authorization (EUA). This EUA will remain  in effect (meaning this test can be used) for the duration of the COVID-19 declaration under Section 564(b)(1) of the Act, 21 U.S.C.section 360bbb-3(b)(1), unless the authorization is terminated  or revoked sooner.       Influenza A by PCR NEGATIVE NEGATIVE Final   Influenza B by PCR NEGATIVE NEGATIVE Final    Comment: (NOTE) The Xpert Xpress  SARS-CoV-2/FLU/RSV plus assay is intended as an aid in the diagnosis of influenza from Nasopharyngeal swab specimens and should not be used as a sole basis for treatment. Nasal washings and aspirates are unacceptable for Xpert Xpress SARS-CoV-2/FLU/RSV testing.  Fact Sheet for Patients: BloggerCourse.com  Fact Sheet for Healthcare Providers: SeriousBroker.it  This test is not yet approved or cleared by the [place] FDA and has been authorized for detection and/or diagnosis of SARS-CoV-2 by FDA under an Emergency Use Authorization (EUA). This EUA will remain in effect (meaning this test can be used) for the duration of the COVID-19 declaration under Section 564(b)(1) of the Act, 21 U.S.C. section 360bbb-3(b)(1), unless the authorization is terminated or revoked.  Performed at [facility] Lab, [address], [place], [place] [address]   Blood Culture ID Panel (Reflexed)     Status: Abnormal   Collection Time: 01/12/20 12:56 PM  Result Value Ref Range Status   Enterococcus faecalis DETECTED (A) NOT DETECTED Final    Comment: CRITICAL RESULT CALLED TO, READ BACK BY AND VERIFIED WITH: J. LEDFORD,PHARMD 1497 01/13/2020 T. TYSOR    Enterococcus Faecium NOT DETECTED NOT DETECTED Final   Listeria monocytogenes NOT DETECTED NOT DETECTED Final   Staphylococcus species DETECTED (A) NOT DETECTED Final    Comment: CRITICAL RESULT CALLED TO, READ BACK BY AND VERIFIED WITH: J. LEDFORD,PHARMD 0263 01/13/2020 T. TYSOR    Staphylococcus aureus (BCID) NOT DETECTED NOT DETECTED Final   Staphylococcus epidermidis NOT DETECTED NOT DETECTED Final   Staphylococcus lugdunensis NOT DETECTED NOT DETECTED Final   Streptococcus species NOT DETECTED NOT DETECTED Final   Streptococcus agalactiae NOT DETECTED NOT  DETECTED Final   Streptococcus pneumoniae NOT DETECTED NOT DETECTED Final   Streptococcus pyogenes NOT DETECTED NOT DETECTED Final    A.calcoaceticus-baumannii NOT DETECTED NOT DETECTED Final   Bacteroides fragilis NOT DETECTED NOT DETECTED Final   Enterobacterales DETECTED (A) NOT DETECTED Final    Comment: Enterobacterales represent a large order of gram negative bacteria, not a single organism. CRITICAL RESULT CALLED TO, READ BACK BY AND VERIFIED WITH: J. LEDFORD,PHARMD [id number] 01/13/2020 T. TYSOR    Enterobacter cloacae complex NOT DETECTED NOT DETECTED Final   Escherichia coli NOT DETECTED NOT DETECTED Final   Klebsiella aerogenes NOT DETECTED NOT DETECTED Final   Klebsiella oxytoca NOT DETECTED NOT DETECTED Final   Klebsiella pneumoniae NOT DETECTED NOT DETECTED Final   Proteus species DETECTED (A) NOT DETECTED Final    Comment: CRITICAL RESULT CALLED TO, READ BACK BY AND VERIFIED WITH: J. LEDFORD,PHARMD 1191 01/13/2020 T. TYSOR    Salmonella species NOT DETECTED NOT DETECTED Final   Serratia marcescens NOT DETECTED NOT DETECTED Final   Haemophilus influenzae NOT DETECTED NOT DETECTED Final   Neisseria meningitidis NOT DETECTED NOT DETECTED Final   Pseudomonas aeruginosa DETECTED (A) NOT DETECTED Final    Comment: CRITICAL RESULT CALLED TO, READ BACK BY AND VERIFIED WITH: J. LEDFORD,PHARMD 4782 01/13/2020 T. TYSOR    Stenotrophomonas maltophilia NOT DETECTED NOT DETECTED Final   Candida albicans NOT DETECTED NOT DETECTED Final   Candida auris NOT DETECTED NOT DETECTED Final   Candida glabrata NOT DETECTED NOT DETECTED Final   Candida krusei NOT DETECTED NOT DETECTED Final   Candida parapsilosis NOT DETECTED NOT DETECTED Final   Candida tropicalis NOT DETECTED NOT DETECTED Final   Cryptococcus neoformans/gattii NOT DETECTED NOT DETECTED Final   CTX-M ESBL NOT DETECTED NOT DETECTED Final   Carbapenem resistance IMP NOT DETECTED NOT DETECTED Final   Carbapenem resistance KPC NOT DETECTED NOT DETECTED Final   Carbapenem resistance NDM NOT DETECTED NOT DETECTED Final   Carbapenem resist OXA 48 LIKE NOT DETECTED  NOT DETECTED Final   Vancomycin resistance NOT DETECTED NOT DETECTED Final   Carbapenem resistance VIM NOT DETECTED NOT DETECTED Final    Comment: Performed at [facility] Lab, [address] [place], [place] [address]  MRSA PCR Screening     Status: None   Collection Time: 01/12/20  9:10 PM   Specimen: Nasal Mucosa; Nasopharyngeal  Result Value Ref Range Status   MRSA by PCR NEGATIVE NEGATIVE Final    Comment:        The GeneXpert MRSA Assay (FDA approved for NASAL specimens only), is one component of a comprehensive MRSA colonization surveillance program. It is not intended to diagnose MRSA infection nor to guide or monitor treatment for MRSA infections. Performed at [facility], [address], [place], [place] [address]   Culture, blood (routine x 2)     Status: None   Collection Time: 01/14/20 10:16 AM   Specimen: BLOOD LEFT HAND  Result Value Ref Range Status   Specimen Description BLOOD LEFT HAND  Final   Special Requests   Final    BOTTLES DRAWN AEROBIC AND ANAEROBIC Blood Culture adequate volume   Culture   Final    NO GROWTH 5 DAYS Performed at [facility], [address], [place], [place] [address]    Report Status 01/19/2020 FINAL  Final  Culture, blood (routine x 2)     Status: None   Collection Time: 01/14/20 10:21 AM   Specimen: BLOOD LEFT  FOREARM  Result Value Ref Range Status   Specimen Description BLOOD LEFT FOREARM  Final   Special Requests   Final    BOTTLES DRAWN AEROBIC AND ANAEROBIC Blood Culture adequate volume   Culture   Final    NO GROWTH 5 DAYS Performed at [facility] Lab, [address] [place] [address] [place], [place] [address]    Report Status 01/19/2020 FINAL  Final  Culture, Urine     Status: None   Collection Time: 01/20/20  3:33 AM   Specimen: Urine, Catheterized  Result Value Ref Range Status   Specimen Description URINE, CATHETERIZED  Final   Special Requests NONE  Final   Culture   Final    NO GROWTH Performed at [facility], [address], [place], [place] [address]    Report Status 01/21/2020 FINAL  Final  Culture, blood (routine x 2)     Status: None (Preliminary result)   Collection Time: 01/20/20  3:42 AM   Specimen: BLOOD RIGHT ARM  Result Value Ref Range Status   Specimen Description BLOOD RIGHT ARM  Final   Special Requests   Final    BOTTLES DRAWN AEROBIC AND ANAEROBIC Blood Culture adequate volume   Culture   Final    NO GROWTH 2 DAYS Performed at [facility] Lab, [address], [place], [place] [address]    Report Status PENDING  Incomplete  Culture, blood (routine x 2)     Status: None (Preliminary result)   Collection Time: 01/20/20  3:56 AM   Specimen: BLOOD RIGHT HAND  Result Value Ref Range Status   Specimen Description BLOOD RIGHT HAND  Final   Special Requests   Final    BOTTLES DRAWN AEROBIC AND ANAEROBIC Blood Culture adequate volume   Culture   Final    NO GROWTH 2 DAYS Performed at [facility] Lab, [address] [place], [place] [address]    Report Status PENDING  Incomplete     Scheduled Meds: . Chlorhexidine Gluconate Cloth  6 each Topical Daily  . [START ON 01/23/2020] ciprofloxacin  500 mg Oral BID  . docusate sodium  100 mg Oral BID  . feeding supplement  237 mL Oral TID WC  . heparin  5,000 Units Subcutaneous Q8H  . insulin aspart  0-9 Units Subcutaneous Q4H  . multivitamin with minerals  1 tablet Oral Daily  . polyethylene glycol  17 g Oral Daily  . polyvinyl alcohol  2 drop Both Eyes Q4H   Continuous Infusions: . piperacillin-tazobactam (ZOSYN)  IV 3.375 g (01/22/20 0516)     LOS: 10 days   Lonia Blood, MD [facility] Office  [phone] Pager - Text Page per Amion  If 7PM-7AM, please contact night-coverage per Amion 01/22/2020, 10:23 AM

## 2020-01-23 DIAGNOSIS — B9689 Other specified bacterial agents as the cause of diseases classified elsewhere: Secondary | ICD-10-CM | POA: Diagnosis not present

## 2020-01-23 DIAGNOSIS — E440 Moderate protein-calorie malnutrition: Secondary | ICD-10-CM | POA: Diagnosis not present

## 2020-01-23 DIAGNOSIS — N10 Acute tubulo-interstitial nephritis: Secondary | ICD-10-CM | POA: Diagnosis not present

## 2020-01-23 DIAGNOSIS — N179 Acute kidney failure, unspecified: Secondary | ICD-10-CM | POA: Diagnosis not present

## 2020-01-23 LAB — COMPREHENSIVE METABOLIC PANEL
ALT: 35 U/L (ref 0–44)
AST: 19 U/L (ref 15–41)
Albumin: 3.1 g/dL — ABNORMAL LOW (ref 3.5–5.0)
Alkaline Phosphatase: 65 U/L (ref 38–126)
Anion gap: 13 (ref 5–15)
BUN: 39 mg/dL — ABNORMAL HIGH (ref 6–20)
CO2: 22 mmol/L (ref 22–32)
Calcium: 10 mg/dL (ref 8.9–10.3)
Chloride: 101 mmol/L (ref 98–111)
Creatinine, Ser: 1.59 mg/dL — ABNORMAL HIGH (ref 0.61–1.24)
GFR, Estimated: 50 mL/min — ABNORMAL LOW (ref >60)
Glucose, Bld: 138 mg/dL — ABNORMAL HIGH (ref 70–99)
Potassium: 4.9 mmol/L (ref 3.5–5.1)
Sodium: 136 mmol/L (ref 135–145)
Total Bilirubin: 0.5 mg/dL (ref 0.3–1.2)
Total Protein: 7.5 g/dL (ref 6.5–8.1)

## 2020-01-23 LAB — CBC
HCT: 45.5 % (ref 39.0–52.0)
Hemoglobin: 14.1 g/dL (ref 13.0–17.0)
MCH: 28 pg (ref 26.0–34.0)
MCHC: 31 g/dL (ref 30.0–36.0)
MCV: 90.3 fL (ref 80.0–100.0)
Platelets: 403 10*3/uL — ABNORMAL HIGH (ref 150–400)
RBC: 5.04 MIL/uL (ref 4.22–5.81)
RDW: 15.7 % — ABNORMAL HIGH (ref 11.5–15.5)
WBC: 21.8 10*3/uL — ABNORMAL HIGH (ref 4.0–10.5)
nRBC: 0 % (ref 0.0–0.2)

## 2020-01-23 LAB — GLUCOSE, CAPILLARY
Glucose-Capillary: 131 mg/dL — ABNORMAL HIGH (ref 70–99)
Glucose-Capillary: 138 mg/dL — ABNORMAL HIGH (ref 70–99)
Glucose-Capillary: 192 mg/dL — ABNORMAL HIGH (ref 70–99)
Glucose-Capillary: 128 mg/dL — ABNORMAL HIGH (ref 70–99)
Glucose-Capillary: 105 mg/dL — ABNORMAL HIGH (ref 70–99)
Glucose-Capillary: 117 mg/dL — ABNORMAL HIGH (ref 70–99)

## 2020-01-23 MED ORDER — SODIUM CHLORIDE 0.9 % IV BOLUS
500.0000 mL | Freq: Once | INTRAVENOUS | Status: AC
  Administered 2020-01-23: 18:00:00 500 mL via INTRAVENOUS

## 2020-01-23 MED ORDER — SODIUM CHLORIDE 0.9 % IV SOLN: INTRAVENOUS | Status: DC

## 2020-01-23 NOTE — Consult Note (Signed)
   [facility] Inpatient Consult   01/23/2020  Travis Cantrell 06-11-1960 [id number]   [place] Organization [ACO] Patient: [name]    Review for length of stay and review shows medical record reveals patient is from and for long term skilled nursing facility.  Plan:  No current [facility] Care Management follow up needs assessed as needs are to be met at [facility] facility. For questions contact:   Natividad Brood, RN BSN [facility] Liaison  [phone] business mobile phone Toll free office [phone]  Fax number: [phone] [email or web address]

## 2020-01-23 NOTE — TOC Progression Note (Signed)
Transition of Care [facility]) - Progression Note    Patient Details  Name: JABIER DEESE MRN: [id number] Date of Birth: [birth year]  Transition of [facility]) CM/SW Contact  Jimmy Picket, [place] Phone Number: 01/23/2020, 3:01 PM  Clinical Narrative:     CSW spoke to pt and daughter [name] about SNF options. CSW informed both that because pt is pending medicaid he will most likely have to return to [facility]. Chelsea stated she understood and will talk to pt and sister. Chelse stated she has concerns about pts care at [facility]. CSW encouraged Chelsea to reach out to [facility] and discuss with Production designer, theatre/television/film.   Expected Discharge Plan: Long Term Nursing Home Barriers to Discharge: Continued Medical Work up  Expected Discharge Plan and Services Expected Discharge Plan: Long Term Nursing Home       Living arrangements for the past 2 months: Skilled Nursing Facility                                       Social Determinants of Health (SDOH) Interventions    Readmission Risk Interventions No flowsheet data found.  Jimmy Picket, Theresia Majors, [place] Clinical Social Worker [phone]

## 2020-01-23 NOTE — Progress Notes (Signed)
Travis Cantrell  NOM:[id number] DOB: February 27, 1960 DOA: 01/12/2020 PCP: Excell Seltzer, MD    Brief Narrative:  59 year old SNF resident with history of quadriplegia, multiple sclerosis, decubitus ulcers, and chronic indwelling Foley catheter who was admitted with 2 days of severe nausea and vomiting and found to be in septic shock. It was discovered that his Foley catheter was clogged. This was able to be replaced in the ED. The patient briefly required vasopressor support and was admitted by PCCM. He was transferred to [facility] 12/21.  Significant Events:  12/19 admit via ED from SNF by PCCM 12/21 transfer to [facility]  Antimicrobials:  Zosyn 12/23 > Cefepime 12/19 > 12/22 Cubicin 12/21 > 12/22  DVT prophylaxis: Subcutaneous heparin  Subjective: Afebrile.  Vital signs stable.  Creatinine is trending upward as is WBC.  No new complaints today.  Resting comfortably at time of visit.  No evidence of blockage of Foley catheter/bladder outlet obstruction.  Assessment & Plan:  Polymicrobial bacteremia with complicated pyelonephritis and septic shock Shock now resolved -ID was involved in the care of the patient -has now completed his course of antibiotic therapy  Neurogenic bladder  - chronic indwelling Foley Foley should be changed every 30 days per urology  Acute kidney injury Due to septic shock and obstruction (blocked Foley) -Foley replaced -has been counseled on it and will consider eventual suprapubic catheter -creatinine on worrisome upward trend at this time which will need to be monitored further prior to considering discharge -fluid challenge today and recheck in a.m. -no evidence of Foley occlusion at time of exam today  Recent Labs  Lab 01/19/20 0115 01/20/20 0342 01/21/20 0154 01/22/20 0210 01/23/20 0219  CREATININE 1.16 1.14 1.14 1.25* 1.59*     Nephrolithiasis -right staghorn calculus Evaluated by urology -to continue 10 additional days of antibiotics in outpatient setting  due to high risk of recurrence of pyelonephritis -will likely need eventual surgical intervention -to follow-up with urology 1 to 2 weeks post discharge -[facility] for February 10, 2020 at 2 PM  Quadriparesis -debilitating multiple sclerosis Lives in SNF  Constipation Continue bowel regimen  Hypomagnesemia Corrected  Decubitus ulcer -present on admission Pressure Injury 01/12/20 Buttocks Mid Stage 2 -  Partial thickness loss of dermis presenting as a shallow open injury with a red, pink wound bed without slough. (Active)  01/12/20 1700  Location: Buttocks  Location Orientation: Mid  Staging: Stage 2 -  Partial thickness loss of dermis presenting as a shallow open injury with a red, pink wound bed without slough.  Wound Description (Comments):   Present on Admission: Yes     Pressure Injury 01/12/20 Back Right;Upper;Bilateral Stage 2 -  Partial thickness loss of dermis presenting as a shallow open injury with a red, pink wound bed without slough. covered with yeast (Active)  01/12/20 1700  Location: Back  Location Orientation: Right;Upper;Bilateral  Staging: Stage 2 -  Partial thickness loss of dermis presenting as a shallow open injury with a red, pink wound bed without slough.  Wound Description (Comments): covered with yeast  Present on Admission: Yes    Code Status: FULL CODE Family Communication:  Status is: Inpatient  Remains inpatient appropriate because:IV treatments appropriate due to intensity of illness or inability to take PO   Dispo: The patient is from: SNF              Anticipated d/c is to: SNF  Anticipated d/c date is: 1 day              Patient currently is not medically stable to d/c.   Consultants:  none  Objective: Blood pressure 105/84, pulse 96, temperature 97.6 F (36.4 C), temperature source Oral, resp. rate 14, height 5\' 10"  (1.778 m), weight 75.3 kg, SpO2 96 %.  Intake/Output  Summary (Last 24 hours) at 01/23/2020 0954 Last data filed at 01/23/2020 0551 Gross per 24 hour  Intake 690 ml  Output 1875 ml  Net -1185 ml   Filed Weights   01/19/20 0500 01/21/20 0500 01/22/20 0500  Weight: 76.1 kg 76 kg 75.3 kg    Examination: General: No acute respiratory distress Lungs: Clear to auscultation bilaterally Cardiovascular: RRR without murmur Abdomen: Soft, BS positive, no rebound Extremities: No edema bilateral lower extremities  CBC: Recent Labs  Lab 01/20/20 0342 01/21/20 0154 01/22/20 0210 01/23/20 0219  WBC 13.8* 16.3* 18.1* 21.8*  NEUTROABS 6.9 8.8* 10.3*  --   HGB 12.1* 13.1 13.8 14.1  HCT 37.8* 40.7 44.8 45.5  MCV 90.9 90.2 90.9 90.3  PLT 323 376 400 403*   Basic Metabolic Panel: Recent Labs  Lab 01/20/20 0342 01/21/20 0154 01/22/20 0210 01/23/20 0219  NA 139 138 136 136  K 4.2 4.7 5.1 4.9  CL 105 102 100 101  CO2 25 25 24 22   GLUCOSE 111* 100* 124* 138*  BUN 20 22* 27* 39*  CREATININE 1.14 1.14 1.25* 1.59*  CALCIUM 9.4 9.7 9.9 10.0  MG 2.0 2.1 2.3  --   PHOS 4.0 4.5 4.6  --    GFR: Estimated Creatinine Clearance: 51.7 mL/min (A) (by C-G formula based on SCr of 1.59 mg/dL (H)).  Liver Function Tests: Recent Labs  Lab 01/20/20 0342 01/21/20 0154 01/22/20 0210 01/23/20 0219  AST 35 27 22 19   ALT 60* 52* 43 35  ALKPHOS 66 65 72 65  BILITOT <0.1* 0.6 0.5 0.5  PROT 6.9 7.4 7.5 7.5  ALBUMIN 2.8* 2.9* 3.1* 3.1*    HbA1C: Hgb A1c MFr Bld  Date/Time Value Ref Range Status  01/12/2020 03:21 PM 5.3 4.8 - 5.6 % Final    Comment:    (NOTE) Pre diabetes:          5.7%-6.4%  Diabetes:              >6.4%  Glycemic control for   <7.0% adults with diabetes   12/01/2017 11:46 AM 5.0 4.6 - 6.5 % Final    Comment:    Glycemic Control Guidelines for People with Diabetes:Non Diabetic:  <6%Goal of Therapy: <7%Additional Action Suggested:  >8%     CBG: Recent Labs  Lab 01/22/20 1123 01/22/20 1627 01/22/20 2002 01/23/20 0111  01/23/20 0820  GLUCAP 167* 141* 136* 131* 138*    Recent Results (from the past 240 hour(s))  Culture, blood (routine x 2)     Status: None   Collection Time: 01/14/20 10:16 AM   Specimen: BLOOD LEFT HAND  Result Value Ref Range Status   Specimen Description BLOOD LEFT HAND  Final   Special Requests   Final    BOTTLES DRAWN AEROBIC AND ANAEROBIC Blood Culture adequate volume   Culture   Final    NO GROWTH 5 DAYS Performed at [facility], [address] [place], [facility] [address]    Report Status 01/19/2020 FINAL  Final  Culture, blood (routine x 2)     Status: None   Collection Time: 01/14/20  10:21 AM   Specimen: BLOOD LEFT FOREARM  Result Value Ref Range Status   Specimen Description BLOOD LEFT FOREARM  Final   Special Requests   Final    BOTTLES DRAWN AEROBIC AND ANAEROBIC Blood Culture adequate volume   Culture   Final    NO GROWTH 5 DAYS Performed at [facility], [address], [place], [place] [address]    Report Status 01/19/2020 FINAL  Final  Culture, Urine     Status: None   Collection Time: 01/20/20  3:33 AM   Specimen: Urine, Catheterized  Result Value Ref Range Status   Specimen Description URINE, CATHETERIZED  Final   Special Requests NONE  Final   Culture   Final    NO GROWTH Performed at [facility], [address] [place], [place] [address]    Report Status 01/21/2020 FINAL  Final  Culture, blood (routine x 2)     Status: None (Preliminary result)   Collection Time: 01/20/20  3:42 AM   Specimen: BLOOD RIGHT ARM  Result Value Ref Range Status   Specimen Description BLOOD RIGHT ARM  Final   Special Requests   Final    BOTTLES DRAWN AEROBIC AND ANAEROBIC Blood Culture adequate volume   Culture   Final    NO GROWTH 3 DAYS Performed at [facility], [address], [place], [place] [address]    Report Status PENDING  Incomplete  Culture, blood (routine x 2)     Status: None (Preliminary result)   Collection Time: 01/20/20   3:56 AM   Specimen: BLOOD RIGHT HAND  Result Value Ref Range Status   Specimen Description BLOOD RIGHT HAND  Final   Special Requests   Final    BOTTLES DRAWN AEROBIC AND ANAEROBIC Blood Culture adequate volume   Culture   Final    NO GROWTH 3 DAYS Performed at [facility], [address], [place], [place] [address]    Report Status PENDING  Incomplete     Scheduled Meds: . Chlorhexidine Gluconate Cloth  6 each Topical Daily  . ciprofloxacin  500 mg Oral BID  . docusate sodium  100 mg Oral BID  . feeding supplement  237 mL Oral TID WC  . heparin  5,000 Units Subcutaneous Q8H  . insulin aspart  0-5 Units Subcutaneous QHS  . insulin aspart  0-9 Units Subcutaneous TID WC  . multivitamin with minerals  1 tablet Oral Daily  . polyethylene glycol  17 g Oral Daily  . polyvinyl alcohol  2 drop Both Eyes Q4H      LOS: 11 days   Lonia Blood, MD [facility] Office  [phone] Pager - Text Page per Amion  If 7PM-7AM, please contact night-coverage per Amion 01/23/2020, 9:54 AM

## 2020-01-24 DIAGNOSIS — N10 Acute tubulo-interstitial nephritis: Secondary | ICD-10-CM | POA: Diagnosis not present

## 2020-01-24 DIAGNOSIS — E440 Moderate protein-calorie malnutrition: Secondary | ICD-10-CM | POA: Diagnosis not present

## 2020-01-24 DIAGNOSIS — N179 Acute kidney failure, unspecified: Secondary | ICD-10-CM | POA: Diagnosis not present

## 2020-01-24 DIAGNOSIS — B9689 Other specified bacterial agents as the cause of diseases classified elsewhere: Secondary | ICD-10-CM | POA: Diagnosis not present

## 2020-01-24 LAB — BASIC METABOLIC PANEL
Anion gap: 16 — ABNORMAL HIGH (ref 5–15)
BUN: 50 mg/dL — ABNORMAL HIGH (ref 6–20)
CO2: 21 mmol/L — ABNORMAL LOW (ref 22–32)
Calcium: 9.8 mg/dL (ref 8.9–10.3)
Chloride: 101 mmol/L (ref 98–111)
Creatinine, Ser: 1.33 mg/dL — ABNORMAL HIGH (ref 0.61–1.24)
GFR, Estimated: >60 mL/min (ref >60)
Glucose, Bld: 105 mg/dL — ABNORMAL HIGH (ref 70–99)
Potassium: 4.4 mmol/L (ref 3.5–5.1)
Sodium: 138 mmol/L (ref 135–145)

## 2020-01-24 LAB — CBC
HCT: 42.6 % (ref 39.0–52.0)
Hemoglobin: 12.9 g/dL — ABNORMAL LOW (ref 13.0–17.0)
MCH: 27.9 pg (ref 26.0–34.0)
MCHC: 30.3 g/dL (ref 30.0–36.0)
MCV: 92.2 fL (ref 80.0–100.0)
Platelets: 402 10*3/uL — ABNORMAL HIGH (ref 150–400)
RBC: 4.62 MIL/uL (ref 4.22–5.81)
RDW: 15.8 % — ABNORMAL HIGH (ref 11.5–15.5)
WBC: 19.9 10*3/uL — ABNORMAL HIGH (ref 4.0–10.5)
nRBC: 0 % (ref 0.0–0.2)

## 2020-01-24 LAB — GLUCOSE, CAPILLARY
Glucose-Capillary: 127 mg/dL — ABNORMAL HIGH (ref 70–99)
Glucose-Capillary: 104 mg/dL — ABNORMAL HIGH (ref 70–99)
Glucose-Capillary: 115 mg/dL — ABNORMAL HIGH (ref 70–99)
Glucose-Capillary: 115 mg/dL — ABNORMAL HIGH (ref 70–99)

## 2020-01-24 MED ORDER — TIZANIDINE HCL 4 MG PO TABS: 4.0000 mg | ORAL_TABLET | Freq: Two times a day (BID) | ORAL | Status: DC

## 2020-01-24 MED ORDER — FLUOXETINE HCL 20 MG PO CAPS
40.0000 mg | ORAL_CAPSULE | Freq: Every day | ORAL | Status: DC
Start: 2020-01-24 — End: 2020-01-26
  Administered 2020-01-24 – 2020-01-25 (×2): 40 mg via ORAL
  Filled 2020-01-24 (×2): qty 2

## 2020-01-24 MED ORDER — SODIUM CHLORIDE 0.9 % IV BOLUS
500.0000 mL | Freq: Once | INTRAVENOUS | Status: AC
  Administered 2020-01-24: 500 mL via INTRAVENOUS

## 2020-01-24 NOTE — Plan of Care (Signed)
  Problem: Education: Goal: Knowledge of General Education information will improve Description: Including pain rating scale, medication(s)/side effects and non-pharmacologic comfort measures Outcome: Progressing   Problem: Clinical Measurements: Goal: Ability to maintain clinical measurements within normal limits will improve Outcome: Progressing Goal: Will remain free from infection Outcome: Progressing Goal: Diagnostic test results will improve Outcome: Progressing Goal: Respiratory complications will improve Outcome: Progressing Goal: Cardiovascular complication will be avoided Outcome: Progressing   Problem: Nutrition: Goal: Adequate nutrition will be maintained Outcome: Progressing   Problem: Skin Integrity: Goal: Risk for impaired skin integrity will decrease Outcome: Progressing

## 2020-01-24 NOTE — Progress Notes (Addendum)
Pt seen for splint check. Pt reports no increased pain from soft elbow splint wear and no immediate skin integrity concerns noted after wear. With confirmation from MD, placed order for splint wearing schedule with printed copy in pt's room with pt permission. Plan to re-assess tolerance to splint wear during next session and prep for DC.   Pt to wear soft elbow splint 4-6 hours during the day and also at night on R UE. Can also be worn to L UE within tolerance (decreased wearing time due to pt L hand dominant and primarily only uses this UE for tasks). Pt may also benefit from antimicrobial, moisture-wicking pillow cases in elbow creases to prevent skin breakdown, as well as double rolled washcloths in B hands.   01/24/20 1300  OT Visit Information  Last OT Received On 01/24/20  Assistance Needed +2  History of Present Illness 59 Y O Male from NH with PMH of MS, GERD, HTN, quadriplegic, bedbound with decubitus ulcer and indwelling foleys catheter who presented to the ED 12/19 with complaints of nausea and vomiting for 2 days. He also had leaking around his Foleys. And complained of some SOB and nonproductive cough during admission. Found to be septic and admitted for treatment of septic shock.  Precautions  Precautions Fall  Precaution Comments quadraparesis due to MS  Pain Assessment  Pain Assessment No/denies pain  Pain Intervention(s) Monitored during session  Cognition  Arousal/Alertness Awake/alert  Behavior During Therapy Flat affect  Overall Cognitive Status No family/caregiver present to determine baseline cognitive functioning  General Comments Follows commands; appears [facility] for basic tasks/questions however did not fully assess. Oriented to place, date and situation. Flat affect, soft spoken  Upper Extremity Assessment  Upper Extremity Assessment RUE deficits/detail;LUE deficits/detail  RUE Deficits / Details contraction of all joints. full wrist extension with PROM. Difficulty with  thumb opposition contractures - thumb with full subluxation of CMC joint. MCP extension to about 80* with PROM.  Pt with mild maceration in the elbow crease. L elbow flexion passively to 90*  RUE Coordination decreased gross motor;decreased fine motor  LUE Deficits / Details Contraction of all joints. MCP flexion to 90* with PROM, thumb opposition contracture able to come out of that minimally with PROM. ~-85* elbow extension achieved with mild maceration of elbow crease noted. Elbow to 100* passive flexion  LUE Coordination decreased fine motor;decreased gross motor  Lower Extremity Assessment  Lower Extremity Assessment Defer to PT evaluation  ADL  Overall ADL's  Needs assistance/impaired  General ADL Comments total A with ADLs at baseline  Restrictions  Weight Bearing Restrictions No  Vision- Assessment  Vision Assessment? No apparent visual deficits  Transfers  General transfer comment Not attempted, will need maximove  OT - End of Session  Activity Tolerance Patient tolerated treatment well  Patient left in bed;with call bell/phone within reach;with bed alarm set  Nurse Communication Other (comment) (splint orders in place and instructions in room)  OT Assessment/Plan  OT Plan Discharge plan remains appropriate  OT Visit Diagnosis Muscle weakness (generalized) (M62.81);Other symptoms and signs involving the nervous system (R29.898);Other (comment)  OT Frequency (ACUTE ONLY) Min 1X/week  Follow Up Recommendations SNF  OT Equipment None recommended by OT  AM-PAC OT "6 Clicks" Daily Activity Outcome Measure (Version 2)  Help from another person eating meals? 1  Help from another person taking care of personal grooming? 1  Help from another person toileting, which includes using toliet, bedpan, or urinal? 1  Help from another person  bathing (including washing, rinsing, drying)? 1  Help from another person to put on and taking off regular upper body clothing? 1  Help from another  person to put on and taking off regular lower body clothing? 1  6 Click Score 6  OT Goal Progression  Progress towards OT goals Progressing toward goals  Acute Rehab OT Goals  Patient Stated Goal wear braces daily for UE's and have LE boots in proper position.  OT Goal Formulation With patient  Time For Goal Achievement 01/30/20  Potential to Achieve Goals Fair  ADL Goals  Additional ADL Goal #1 Pt will tolerate bil. hand and elbow splints without increased pain or increased pressure  Additional ADL Goal #2 Pt will independently direct staff on wear schedule for splints  OT Time Calculation  OT Start Time (ACUTE ONLY) 1321  OT Stop Time (ACUTE ONLY) 1331  OT Time Calculation (min) 10 min  OT General Charges  $OT Visit 1 Visit  OT Treatments  $Orthotics Fit/Training 8-22 mins  $ OT Supplies 1 Supply (49.50)

## 2020-01-24 NOTE — Progress Notes (Signed)
Travis Cantrell  TKW:[id number] DOB: 12/16/1960 DOA: 01/12/2020 PCP: Excell Seltzer, MD    Brief Narrative:  59 year old SNF resident with history of quadriplegia, multiple sclerosis, decubitus ulcers, and chronic indwelling Foley catheter who was admitted with 2 days of severe nausea and vomiting and found to be in septic shock. It was discovered that his Foley catheter was clogged. This was able to be replaced in the ED. The patient briefly required vasopressor support and was admitted by PCCM. He was transferred to [facility] 12/21.  Significant Events:  12/19 admit via ED from SNF by PCCM 12/21 transfer to [facility]  Antimicrobials:  Zosyn 12/23 > Cefepime 12/19 > 12/22 Cubicin 12/21 > 12/22  DVT prophylaxis: Subcutaneous heparin  Subjective: Afebrile with stable vital signs.  Creatinine has improved with gentle volume expansion.  WBC now trending downward.  No new complaints today.  Resting comfortably in bed.  Reports that his appetite is fair.  Assessment & Plan:  Polymicrobial bacteremia with complicated pyelonephritis and septic shock Shock now resolved -ID was involved in the care of the patient -has now completed his course of antibiotic therapy for this indication  Neurogenic bladder  - chronic indwelling Foley Foley should be changed every 30 days per Urology  Acute kidney injury Due to septic shock and obstruction (blocked Foley) -Foley replaced -has been counseled on it and will consider eventual suprapubic catheter - no evidence of Foley occlusion -creatinine trending back downward after simple volume challenge suggesting recent acute decline related to volume depletion/poor intake - will need to encourage oral intake of liquids and monitor renal function intermittently  Recent Labs  Lab 01/20/20 0342 01/21/20 0154 01/22/20 0210 01/23/20 0219 01/24/20 0229  CREATININE 1.14 1.14 1.25* 1.59* 1.33*     Nephrolithiasis -right staghorn calculus Evaluated by Urology -to  continue 10 additional days of ciprofloxacin due to high risk of recurrence of pyelonephritis -will likely need eventual surgical intervention -to follow-up with Urology 1 to 2 weeks post discharge -[facility] for February 10, 2020 at 2 PM  Quadriparesis -debilitating multiple sclerosis Lives in SNF  Constipation Continue bowel regimen  Hypomagnesemia Corrected  Decubitus ulcer -present on admission Pressure Injury 01/12/20 Buttocks Mid Stage 2 -  Partial thickness loss of dermis presenting as a shallow open injury with a red, pink wound bed without slough. (Active)  01/12/20 1700  Location: Buttocks  Location Orientation: Mid  Staging: Stage 2 -  Partial thickness loss of dermis presenting as a shallow open injury with a red, pink wound bed without slough.  Wound Description (Comments):   Present on Admission: Yes     Pressure Injury 01/12/20 Back Right;Upper;Bilateral Stage 2 -  Partial thickness loss of dermis presenting as a shallow open injury with a red, pink wound bed without slough. covered with yeast (Active)  01/12/20 1700  Location: Back  Location Orientation: Right;Upper;Bilateral  Staging: Stage 2 -  Partial thickness loss of dermis presenting as a shallow open injury with a red, pink wound bed without slough.  Wound Description (Comments): covered with yeast  Present on Admission: Yes    Code Status: FULL CODE Family Communication:  Status is: Inpatient  Remains inpatient appropriate because:IV treatments appropriate due to intensity of illness or inability to take PO   Dispo: The patient is from: SNF              Anticipated d/c is to: SNF  Anticipated d/c date is: 1 day              Patient currently is medically stable to d/c.   Consultants:  none  Objective: Blood pressure 112/84, pulse 88, temperature 98.2 F (36.8 C), temperature source Oral, resp. rate 19, height 5\' 10"  (1.778 m), weight  75.3 kg, SpO2 97 %.  Intake/Output Summary (Last 24 hours) at 01/24/2020 0918 Last data filed at 01/24/2020 0856 Gross per 24 hour  Intake 1969 ml  Output 1000 ml  Net 969 ml   Filed Weights   01/19/20 0500 01/21/20 0500 01/22/20 0500  Weight: 76.1 kg 76 kg 75.3 kg    Examination: General: No acute respiratory distress Lungs: Clear to auscultation bB - no wheezing  Cardiovascular: RRR  Abdomen: Soft, BS positive, no rebound Extremities: No edema B LE   CBC: Recent Labs  Lab 01/20/20 0342 01/21/20 0154 01/22/20 0210 01/23/20 0219 01/24/20 0229  WBC 13.8* 16.3* 18.1* 21.8* 19.9*  NEUTROABS 6.9 8.8* 10.3*  --   --   HGB 12.1* 13.1 13.8 14.1 12.9*  HCT 37.8* 40.7 44.8 45.5 42.6  MCV 90.9 90.2 90.9 90.3 92.2  PLT 323 376 400 403* 402*   Basic Metabolic Panel: Recent Labs  Lab 01/20/20 0342 01/21/20 0154 01/22/20 0210 01/23/20 0219 01/24/20 0229  NA 139 138 136 136 138  K 4.2 4.7 5.1 4.9 4.4  CL 105 102 100 101 101  CO2 25 25 24 22  21*  GLUCOSE 111* 100* 124* 138* 105*  BUN 20 22* 27* 39* 50*  CREATININE 1.14 1.14 1.25* 1.59* 1.33*  CALCIUM 9.4 9.7 9.9 10.0 9.8  MG 2.0 2.1 2.3  --   --   PHOS 4.0 4.5 4.6  --   --    GFR: Estimated Creatinine Clearance: 61.7 mL/min (A) (by C-G formula based on SCr of 1.33 mg/dL (H)).  Liver Function Tests: Recent Labs  Lab 01/20/20 0342 01/21/20 0154 01/22/20 0210 01/23/20 0219  AST 35 27 22 19   ALT 60* 52* 43 35  ALKPHOS 66 65 72 65  BILITOT <0.1* 0.6 0.5 0.5  PROT 6.9 7.4 7.5 7.5  ALBUMIN 2.8* 2.9* 3.1* 3.1*    HbA1C: Hgb A1c MFr Bld  Date/Time Value Ref Range Status  01/12/2020 03:21 PM 5.3 4.8 - 5.6 % Final    Comment:    (NOTE) Pre diabetes:          5.7%-6.4%  Diabetes:              >6.4%  Glycemic control for   <7.0% adults with diabetes   12/01/2017 11:46 AM 5.0 4.6 - 6.5 % Final    Comment:    Glycemic Control Guidelines for People with Diabetes:Non Diabetic:  <6%Goal of Therapy: <7%Additional  Action Suggested:  >8%     CBG: Recent Labs  Lab 01/23/20 1118 01/23/20 1722 01/23/20 1928 01/23/20 2208 01/24/20 0819  GLUCAP 192* 128* 105* 117* 127*    Recent Results (from the past 240 hour(s))  Culture, blood (routine x 2)     Status: None   Collection Time: 01/14/20 10:16 AM   Specimen: BLOOD LEFT HAND  Result Value Ref Range Status   Specimen Description BLOOD LEFT HAND  Final   Special Requests   Final    BOTTLES DRAWN AEROBIC AND ANAEROBIC Blood Culture adequate volume   Culture   Final    NO GROWTH 5 DAYS Performed at [facility], [address], [place],  [place] [address]    Report Status 01/19/2020 FINAL  Final  Culture, blood (routine x 2)     Status: None   Collection Time: 01/14/20 10:21 AM   Specimen: BLOOD LEFT FOREARM  Result Value Ref Range Status   Specimen Description BLOOD LEFT FOREARM  Final   Special Requests   Final    BOTTLES DRAWN AEROBIC AND ANAEROBIC Blood Culture adequate volume   Culture   Final    NO GROWTH 5 DAYS Performed at [facility], [address], [place], [place] [address]    Report Status 01/19/2020 FINAL  Final  Culture, Urine     Status: None   Collection Time: 01/20/20  3:33 AM   Specimen: Urine, Catheterized  Result Value Ref Range Status   Specimen Description URINE, CATHETERIZED  Final   Special Requests NONE  Final   Culture   Final    NO GROWTH Performed at [facility], [address] [place], [place] [address]    Report Status 01/21/2020 FINAL  Final  Culture, blood (routine x 2)     Status: None (Preliminary result)   Collection Time: 01/20/20  3:42 AM   Specimen: BLOOD RIGHT ARM  Result Value Ref Range Status   Specimen Description BLOOD RIGHT ARM  Final   Special Requests   Final    BOTTLES DRAWN AEROBIC AND ANAEROBIC Blood Culture adequate volume   Culture   Final    NO GROWTH 4 DAYS Performed at [facility], [address] [place], [place] [address]    Report Status  PENDING  Incomplete  Culture, blood (routine x 2)     Status: None (Preliminary result)   Collection Time: 01/20/20  3:56 AM   Specimen: BLOOD RIGHT HAND  Result Value Ref Range Status   Specimen Description BLOOD RIGHT HAND  Final   Special Requests   Final    BOTTLES DRAWN AEROBIC AND ANAEROBIC Blood Culture adequate volume   Culture   Final    NO GROWTH 4 DAYS Performed at [facility], [address], [place], [place] [address]    Report Status PENDING  Incomplete     Scheduled Meds: . Chlorhexidine Gluconate Cloth  6 each Topical Daily  . ciprofloxacin  500 mg Oral BID  . docusate sodium  100 mg Oral BID  . feeding supplement  237 mL Oral TID WC  . heparin  5,000 Units Subcutaneous Q8H  . insulin aspart  0-5 Units Subcutaneous QHS  . insulin aspart  0-9 Units Subcutaneous TID WC  . multivitamin with minerals  1 tablet Oral Daily  . polyethylene glycol  17 g Oral Daily  . polyvinyl alcohol  2 drop Both Eyes Q4H   . sodium chloride 75 mL/hr at 01/23/20 2121     LOS: 12 days   Lonia Blood, MD [facility] Office  [phone] Pager - Text Page per Amion  If 7PM-7AM, please contact night-coverage per Amion 01/24/2020, 9:18 AM

## 2020-01-24 NOTE — Progress Notes (Signed)
Orthopedic Tech Progress Note Patient Details:  Travis Cantrell 18-Jun-1960 [id number] Therapy makes SOFT ELBOW SPLINT. Cancelled order to [facility].  Patient ID: Travis Cantrell, male   DOB: Sep 10, 1960, 59 y.o.   MRN: [id number]   [name] 01/24/2020, 2:01 PM

## 2020-01-24 NOTE — Progress Notes (Signed)
Orthopedic Tech Progress Note Patient Details:  Travis Cantrell 01/09/1961 [id number] Called in order to [facility] for an ELBOW SPLINT Patient ID: Travis Cantrell, male   DOB: November 12, 1960, 59 y.o.   MRN: [id number]   Travis Cantrell 01/24/2020, 1:49 PM

## 2020-01-25 DIAGNOSIS — N179 Acute kidney failure, unspecified: Secondary | ICD-10-CM | POA: Diagnosis not present

## 2020-01-25 DIAGNOSIS — A419 Sepsis, unspecified organism: Secondary | ICD-10-CM | POA: Diagnosis not present

## 2020-01-25 DIAGNOSIS — N10 Acute tubulo-interstitial nephritis: Secondary | ICD-10-CM | POA: Diagnosis not present

## 2020-01-25 DIAGNOSIS — E872 Acidosis: Secondary | ICD-10-CM | POA: Diagnosis not present

## 2020-01-25 LAB — CBC
HCT: 36.9 % — ABNORMAL LOW (ref 39.0–52.0)
Hemoglobin: 11.7 g/dL — ABNORMAL LOW (ref 13.0–17.0)
MCH: 29.3 pg (ref 26.0–34.0)
MCHC: 31.7 g/dL (ref 30.0–36.0)
MCV: 92.3 fL (ref 80.0–100.0)
Platelets: 326 10*3/uL (ref 150–400)
RBC: 4 MIL/uL — ABNORMAL LOW (ref 4.22–5.81)
RDW: 15.4 % (ref 11.5–15.5)
WBC: 14.2 10*3/uL — ABNORMAL HIGH (ref 4.0–10.5)
nRBC: 0 % (ref 0.0–0.2)

## 2020-01-25 LAB — BASIC METABOLIC PANEL
Anion gap: 9 (ref 5–15)
BUN: 37 mg/dL — ABNORMAL HIGH (ref 6–20)
CO2: 18 mmol/L — ABNORMAL LOW (ref 22–32)
Calcium: 8.1 mg/dL — ABNORMAL LOW (ref 8.9–10.3)
Chloride: 110 mmol/L (ref 98–111)
Creatinine, Ser: 0.81 mg/dL (ref 0.61–1.24)
GFR, Estimated: >60 mL/min (ref >60)
Glucose, Bld: 93 mg/dL (ref 70–99)
Potassium: 4 mmol/L (ref 3.5–5.1)
Sodium: 137 mmol/L (ref 135–145)

## 2020-01-25 LAB — GLUCOSE, CAPILLARY
Glucose-Capillary: 92 mg/dL (ref 70–99)
Glucose-Capillary: 108 mg/dL — ABNORMAL HIGH (ref 70–99)
Glucose-Capillary: 135 mg/dL — ABNORMAL HIGH (ref 70–99)
Glucose-Capillary: 132 mg/dL — ABNORMAL HIGH (ref 70–99)

## 2020-01-25 LAB — CULTURE, BLOOD (ROUTINE X 2)
Culture: NO GROWTH
Culture: NO GROWTH
Special Requests: ADEQUATE
Special Requests: ADEQUATE

## 2020-01-25 LAB — SARS CORONAVIRUS 2 (TAT 6-24 HRS): SARS Coronavirus 2: NEGATIVE

## 2020-01-25 MED ORDER — CIPROFLOXACIN HCL 500 MG PO TABS: 500.0000 mg | ORAL_TABLET | Freq: Two times a day (BID) | ORAL | 0 refills | Status: AC

## 2020-01-25 MED ORDER — POLYVINYL ALCOHOL 1.4 % OP SOLN: 2.0000 [drp] | OPHTHALMIC | 0 refills | Status: DC

## 2020-01-25 MED ORDER — ENSURE ENLIVE PO LIQD: 237.0000 mL | Freq: Three times a day (TID) | ORAL | 12 refills | Status: DC

## 2020-01-25 NOTE — Discharge Instructions (Signed)
Urosepsis, Adult  Urosepsis is a type of sepsis. Sepsis is a severe bodily reaction to an infection. Urosepsis is caused by a bacterial infection that starts in the urinary tract and spreads to the blood. The urinary tract is the system where urine is made, stored, and passed out of the body and includes the kidneys, ureters, bladder, and urethra. This may also be called the urinary system. In severe cases, sepsis can lead to septic shock. Septic shock can weaken your heart and cause your blood pressure to drop. This can make the body's central nervous system and other vital organs stop working. Urosepsis is a medical emergency that requires immediate treatment in a hospital. What are the causes? Common causes of this condition include:  A urinary tract infection (UTI) that spreads to your blood.  A urinary tract blockage due to kidney stones.  Swelling and inflammation of the prostate (prostatitis) or prostate infection, in males. What increases the risk? You are more likely to develop this condition if you:  Are male, especially if you are sexually active.  Are age 60 or older.  Have a long-term disease, such as kidney disease or diabetes.  Have a weak disease-fighting system (immune system).  Have a condition that lessens or changes urine flow, such as a kidney or bladder stone, prostate disease, or a tumor in the urinary tract.  Have had surgery in an area of the urinary tract.  Have a small, thin tube in your urethra that drains urine from your bladder for a period of time (indwelling urinary catheter).  Have lost feeling below the waist or are in a wheelchair. What are the signs or symptoms? Early symptoms of this condition are similar to symptoms of a severe UTI. Common symptoms of this condition include:  Pain in your side, back, or lower abdomen.  Fever and chills.  Nausea and vomiting.  Frequent need to pass urine.  Burning pain when passing urine.  Bloody or  cloudy urine.  Bad-smelling urine.  Fatigue.  Trouble passing urine or not being able to pass urine at all. Once the infection has spread to the blood and a sepsis reaction starts, other symptoms may include:  Chills with shaking.  Cold and clammy skin.  A fever of 101.66F (38.5C) or higher.  Low body temperature of 96.76F (36C) or lower.  Fast breathing.  Trouble breathing.  Fast heartbeat.  Severe pain in the abdomen.  Muscle aches.  Anxiety.  Problems staying awake.  Confusion.  Fainting. How is this diagnosed? This condition is diagnosed based on your symptoms, your medical history, and a physical exam. You may also have:  Urine tests or blood tests to check kidney function and to look for infection.  Imaging tests such as a CT scan or ultrasound to check for blockages in the urinary system. How is this treated? This condition is a medical emergency that needs to be treated right away in the hospital. This condition may be treated with:  An IV so that you can quickly receive: ? Antibiotic medicines. ? Fluids. ? Medicines to support blood pressure.  Oxygen and breathing support, if needed.  Removing a urinary catheter if it is the source of the infection, if this applies.  Filtering your blood with a machine (dialysis). This process cleans your blood if your kidneys have failed.  Surgery to drain infected areas or restore urine flow. This is rare. Follow these instructions at home: Medicines  Take over-the-counter and prescription medicines only as told  by your health care provider.  If you were prescribed an antibiotic medicine, take it as told by your health care provider. Do not stop using the antibiotic even if you start to feel better. General instructions   Drink enough fluid to keep your urine pale yellow.  Return to your normal activities as told by your health care provider. Ask your health care provider what activities are safe for  you.  Keep all follow-up visits as told by your health care provider. This is important. Contact a health care provider if:  You have symptoms that get worse or do not get better with treatment.  You have new UTI symptoms. Get help right away if:  You have new or continued symptoms of sepsis after hospitalization, such as: ? A fever of 101.74F (38.5C) or higher. ? Low body temperature of 96.35F (36C) or lower. ? Chills. ? Severe pain. ? Difficulty breathing. ? Confusion. ? Sleepiness. ? Nausea and vomiting. These symptoms may represent a serious problem that is an emergency. Do not wait to see if the symptoms will go away. Get medical help right away. Call your local emergency services (911 in the [place] Do not drive yourself to the hospital. Summary  Urosepsis is a type of sepsis. Sepsis is a severe bodily reaction to an infection.  Urosepsis is a medical emergency that requires immediate treatment in a hospital.  Possible causes of urosepsis include a urinary tract infection that spreads to your blood, blockage from kidney stones, and prostate swelling or infection in males.  This condition may be treated with an IV so that you can quickly receive antibiotic medicines, fluids, and medicines to support your blood pressure. Other treatments may be used as well.  Get help right away if you have new or continued symptoms of sepsis after hospitalization. This information is not intended to replace advice given to you by your health care provider. Make sure you discuss any questions you have with your health care provider. Document Revised: 02/07/2018 Document Reviewed: 02/08/2018 Elsevier Patient Education  2020 Elsevier Inc.    Acute Kidney Injury, Adult  Acute kidney injury is a sudden worsening of kidney function. The kidneys are organs that have several jobs. They filter the blood to remove waste products and extra fluid. They also maintain a healthy balance of minerals and  hormones in the body, which helps control blood pressure and keep bones strong. With this condition, your kidneys do not do their jobs as well as they should. This condition ranges from mild to severe. Over time it may develop into long-lasting (chronic) kidney disease. Early detection and treatment may prevent acute kidney injury from developing into a chronic condition. What are the causes? Common causes of this condition include:  A problem with blood flow to the kidneys. This may be caused by: ? Low blood pressure (hypotension) or shock. ? Blood loss. ? Heart and blood vessel (cardiovascular) disease. ? Severe burns. ? Liver disease.  Direct damage to the kidneys. This may be caused by: ? Certain medicines. ? A kidney infection. ? Poisoning. ? Being around or in contact with toxic substances. ? A surgical wound. ? A hard, direct hit to the kidney area.  A sudden blockage of urine flow. This may be caused by: ? Cancer. ? Kidney stones. ? An enlarged prostate in males. What are the signs or symptoms? Symptoms of this condition may not be obvious until the condition becomes severe. Symptoms of this condition can include:  Tiredness (  lethargy), or difficulty staying awake.  Nausea or vomiting.  Swelling (edema) of the face, legs, ankles, or feet.  Problems with urination, such as: ? Abdominal pain, or pain along the side of your stomach (flank). ? Decreased urine production. ? Decrease in the force of urine flow.  Muscle twitches and cramps, especially in the legs.  Confusion or trouble concentrating.  Loss of appetite.  Fever. How is this diagnosed? This condition may be diagnosed with tests, including:  Blood tests.  Urine tests.  Imaging tests.  A test in which a sample of tissue is removed from the kidneys to be examined under a microscope (kidney biopsy). How is this treated? Treatment for this condition depends on the cause and how severe the condition  is. In mild cases, treatment may not be needed. The kidneys may heal on their own. In more severe cases, treatment will involve:  Treating the cause of the kidney injury. This may involve changing any medicines you are taking or adjusting your dosage.  Fluids. You may need specialized IV fluids to balance your body's needs.  Having a catheter placed to drain urine and prevent blockages.  Preventing problems from occurring. This may mean avoiding certain medicines or procedures that can cause further injury to the kidneys. In some cases treatment may also require:  A procedure to remove toxic wastes from the body (dialysis or continuous renal replacement therapy - CRRT).  Surgery. This may be done to repair a torn kidney, or to remove the blockage from the urinary system. Follow these instructions at home: Medicines  Take over-the-counter and prescription medicines only as told by your health care provider.  Do not take any new medicines without your health care provider's approval. Many medicines can worsen your kidney damage.  Do not take any vitamin and mineral supplements without your health care provider's approval. Many nutritional supplements can worsen your kidney damage. Lifestyle  If your health care provider prescribed changes to your diet, follow them. You may need to decrease the amount of protein you eat.  Achieve and maintain a healthy weight. If you need help with this, ask your health care provider.  Start or continue an exercise plan. Try to exercise at least 30 minutes a day, 5 days a week.  Do not use any tobacco products, such as cigarettes, chewing tobacco, and e-cigarettes. If you need help quitting, ask your health care provider. General instructions  Keep track of your blood pressure. Report changes in your blood pressure as told by your health care provider.  Stay up to date with immunizations. Ask your health care provider which immunizations you  need.  Keep all follow-up visits as told by your health care provider. This is important. Where to find more information  American Association of Kidney Patients: ResidentialShow.is  [employer]: www.kidney.org  American Kid[employer]: FightingMatch.com.ee  [employer] [facility]: ? www.lifeoptions.org ? www.kidneyschool.org Contact a health care provider if:  Your symptoms get worse.  You develop new symptoms. Get help right away if:  You develop symptoms of worsening kidney disease, which include: ? Headaches. ? Abnormally dark or light skin. ? Easy bruising. ? Frequent hiccups. ? Chest pain. ? Shortness of breath. ? End of menstruation in women. ? Seizures. ? Confusion or altered mental status. ? Abdominal or back pain. ? Itchiness.  You have a fever.  Your body is producing less urine.  You have pain or bleeding when you urinate. Summary  Acute kidney injury is a  sudden worsening of kidney function.  Acute kidney injury can be caused by problems with blood flow to the kidneys, direct damage to the kidneys, and sudden blockage of urine flow.  Symptoms of this condition may not be obvious until it becomes severe. Symptoms may include edema, lethargy, confusion, nausea or vomiting, and problems passing urine.  This condition can usually be diagnosed with blood tests, urine tests, and imaging tests. Sometimes a kidney biopsy is done to diagnose this condition.  Treatment for this condition often involves treating the underlying cause. It is treated with fluids, medicines, dialysis, diet changes, or surgery. This information is not intended to replace advice given to you by your health care provider. Make sure you discuss any questions you have with your health care provider. Document Revised: 12/23/2016 Document Reviewed: 01/01/2016 Elsevier Patient Education  2020 [employer].

## 2020-01-25 NOTE — Progress Notes (Signed)
Called discharge report to [facility]. Spoke to  [name] who transferred me to a voice mail. Called back several times to give report with no answer. VM left on the administrators line regarding attempt to give report. Pt is COVID negative. Pt to be transported by [facility]. Oncoming nurse notified of patient's pending discharge and  The inability to give report.

## 2020-01-25 NOTE — TOC Transition Note (Signed)
Transition of Care [facility]) - CM/SW Discharge Note   Patient Details  Name: Travis Cantrell MRN: [id number] Date of Birth: [birth year]  Transition of Care [facility]) CM/SW Contact:  Levada Schilling Phone Number: 01/25/2020, 1:45 PM   Clinical Narrative:    Patient will Discharge To: [facility] Anticipated DC Date:01/25/2020 Family Notified:yes, left vm message for daughters.  Spoke with pt's sister Santo Held, [phone] Transport By: Sharin Mons   Per MD patient ready for DC to [facility] . RN, patient, patient's family, and facility notified of DC. Assessment, Fl2/Pasrr, and Discharge Summary sent to facility. RN given number for report [phone] ask to speak with Chrissie Noa). DC packet on chart. Ambulance transport requested for patient.   CSW signing off.  Budd Palmer LCSWA [phone]    Budd Palmer, LCSWA [phone]   Final next level of care: Skilled Nursing Facility Barriers to Discharge: No Barriers Identified   Patient Goals and CMS Choice Patient states their goals for this hospitalization and ongoing recovery are:: to get better CMS Medicare.gov Compare Post Acute Care list provided to:: Patient Choice offered to / list presented to : Patient  Discharge Placement              Patient chooses bed at:  ([facility]) Patient to be transferred to facility by: [facility] Name of family member notified: Santo Held, sister Patient and family notified of of transfer: 01/25/20  Discharge Plan and Services                                     Social Determinants of Health (SDOH) Interventions     Readmission Risk Interventions No flowsheet data found.

## 2020-01-25 NOTE — Plan of Care (Signed)
  Problem: Pain Managment: Goal: General experience of comfort will improve Outcome: Progressing   Problem: Safety: Goal: Ability to remain free from injury will improve Outcome: Progressing   Problem: Skin Integrity: Goal: Risk for impaired skin integrity will decrease Outcome: Progressing   

## 2020-01-25 NOTE — Discharge Summary (Signed)
DISCHARGE SUMMARY  Travis Cantrell  MR#: [id number]  DOB:[birth year]  Date of Admission: 01/12/2020 Date of Discharge: 01/25/2020  Attending Physician:Travis Ohlson Hennie Duos, MD  Patient's WUJ:[id number], [name], MD  Consults:  PCCM ID Urology   Disposition: D/C to SNF    Follow-up Appts:  Follow-up Information    [name] Frock, MD Follow up in 2 week(s).   Specialty: Urology Why: You are scheduled for a follow up with Dr. Tresa Moore on Monday, February 10, 2020 at 2:30 pm at the office. Contact information: [address] [place] [address] [phone]               Issues Needing Follow-up: -Foley should be changed every 30 days -encourage oral intake of liquids -monitor renal function intermittently  Discharge Diagnoses: Polymicrobial bacteremia Complicated pyelonephritis Septic shock Neurogenic bladder  - chronic indwelling Foley Acute kidney injury Nephrolithiasis - right staghorn calculus Quadriparesis Debilitating multiple sclerosis Chronic Constipation Hypomagnesemia Decubitus ulcer - present on admission  Initial presentation: 60 year old SNF resident with history of quadriplegia, multiple sclerosis, decubitus ulcers, and chronic indwelling Foley catheter who was admitted with 2 days of severe nausea and vomiting and found to be in septic shock. It was discovered that his Foley catheter was clogged. This was able to be replaced in the ED. The patient briefly required vasopressor support and was admitted by PCCM. He was transferred to [facility] 12/21.  Hospital Course:  Polymicrobial bacteremia with complicated pyelonephritis and septic shock Shock now resolved -ID was involved in the care of the patient -has now completed his course of antibiotic therapy for this indication with no persisting clinical signs of active infection  Neurogenic bladder  - chronic indwelling Foley Foley should be changed every 30 days per Urology  Acute kidney injury Initially due  to septic shock and obstruction (blocked Foley) -Foley replaced -has been counseled on and will consider eventual suprapubic catheter - no evidence of Foley occlusion -creatinine trended upward transiently during later portion of hospital stay, but improved again after simple volume challenge - will need to encourage oral intake of liquids and monitor renal function intermittently  Nephrolithiasis -right staghorn calculus Evaluated by Urology -to complete a 10 day course of ciprofloxacin due to high risk of recurrence of pyelonephritis -will likely need eventual surgical intervention -to follow-up with Urology 1 to 2 weeks post discharge -[facility] for February 10, 2020 at 2 PM  Quadriparesis -debilitating multiple sclerosis Lives in SNF  Constipation Continue bowel regimen  Hypomagnesemia Corrected  Decubitus ulcer -present on admission Pressure Injury 01/12/20 Buttocks Mid Stage 2 -  Partial thickness loss of dermis presenting as a shallow open injury with a red, pink wound bed without slough. (Active)  01/12/20 1700  Location: Buttocks  Location Orientation: Mid  Staging: Stage 2 -  Partial thickness loss of dermis presenting as a shallow open injury with a red, pink wound bed without slough.  Wound Description (Comments):   Present on Admission: Yes     Pressure Injury 01/12/20 Back Right;Upper;Bilateral Stage 2 -  Partial thickness loss of dermis presenting as a shallow open injury with a red, pink wound bed without slough. covered with yeast (Active)  01/12/20 1700  Location: Back  Location Orientation: Right;Upper;Bilateral  Staging: Stage 2 -  Partial thickness loss of dermis presenting as a shallow open injury with a red, pink wound bed without slough.  Wound Description (Comments): covered with yeast  Present on Admission: Yes  Allergies as of 01/25/2020      Reactions   Baclofen Diarrhea      Medication List     STOP taking these medications   albuterol (2.5 MG/3ML) 0.083% nebulizer solution Commonly known as: PROVENTIL   [place] Hydrogel Foley Cath 18FR Misc   lisinopril 20 MG tablet Commonly known as: ZESTRIL   loratadine 10 MG tablet Commonly known as: CLARITIN   Oxcarbazepine 300 MG tablet Commonly known as: TRILEPTAL   tiZANidine 4 MG tablet Commonly known as: ZANAFLEX     TAKE these medications   acetaminophen 325 MG tablet Commonly known as: TYLENOL Take 650 mg by mouth every 6 (six) hours as needed for mild pain.   bisacodyl 5 MG EC tablet Commonly known as: DULCOLAX Take 1 tablet (5 mg total) by mouth daily as needed for moderate constipation.   butalbital-acetaminophen-caffeine 50-325-40 MG tablet Commonly known as: FIORICET Take 1 tablet by mouth every 6 (six) hours as needed for headache or migraine.   ciprofloxacin 500 MG tablet Commonly known as: CIPRO Take 1 tablet (500 mg total) by mouth 2 (two) times daily for 7 days.   feeding supplement Liqd Take 237 mLs by mouth 3 (three) times daily with meals.   FLUoxetine 20 MG capsule Commonly known as: PROZAC TAKE 2 CAPSULES BY MOUTH ONCE DAILY IN THE MORNING AND 1 CAPSULE ONCE DAILY IN THE EVENING What changed: See the new instructions.   ibuprofen 800 MG tablet Commonly known as: ADVIL TAKE 1 TABLET BY MOUTH EVERY 8 HOURS AS NEEDED What changed: additional instructions   multivitamin with minerals Tabs tablet Take 1 tablet by mouth daily.   nystatin powder Generic drug: nystatin APPLY  POWDER TOPICALLY TO RASH 4 TIMES DAILY   ondansetron 4 MG disintegrating tablet Commonly known as: Zofran ODT Take 1 tablet (4 mg total) by mouth every 8 (eight) hours as needed for nausea or vomiting.   polyethylene glycol 17 g packet Commonly known as: MIRALAX / GLYCOLAX Take 17 g by mouth daily as needed.   polyvinyl alcohol 1.4 % ophthalmic solution Commonly known as: LIQUIFILM TEARS Place 2 drops into both eyes  every 4 (four) hours.   Vitamin D3 50 MCG (2000 UT) Tabs Take 4,000 Units by mouth daily.       Day of Discharge BP 120/82 (BP Location: Right Arm)   Pulse 73   Temp 98 F (36.7 C) (Oral)   Resp 20   Ht 5\' 10"  (1.778 m)   Wt 77 kg   SpO2 99%   BMI 24.36 kg/m   Physical Exam: General: No acute respiratory distress Lungs: Clear to auscultation bilaterally Cardiovascular: Regular rate and rhythm without murmur  Abdomen: Nontender, nondistended, soft, bowel sounds positive Extremities: No significant edema bilateral lower extremities  Basic Metabolic Panel: Recent Labs  Lab 01/19/20 0115 01/20/20 0342 01/21/20 0154 01/22/20 0210 01/23/20 0219 01/24/20 0229 01/25/20 0308  NA 138 139 138 136 136 138 137  K 4.1 4.2 4.7 5.1 4.9 4.4 4.0  CL 105 105 102 100 101 101 110  CO2 26 25 25 24 22  21* 18*  GLUCOSE 131* 111* 100* 124* 138* 105* 93  BUN 22* 20 22* 27* 39* 50* 37*  CREATININE 1.16 1.14 1.14 1.25* 1.59* 1.33* 0.81  CALCIUM 8.8* 9.4 9.7 9.9 10.0 9.8 8.1*  MG 1.8 2.0 2.1 2.3  --   --   --   PHOS 3.0 4.0 4.5 4.6  --   --   --  Liver Function Tests: Recent Labs  Lab 01/19/20 0115 01/20/20 0342 01/21/20 0154 01/22/20 0210 01/23/20 0219  AST 45* 35 27 22 19   ALT 55* 60* 52* 43 35  ALKPHOS 58 66 65 72 65  BILITOT 0.6 <0.1* 0.6 0.5 0.5  PROT 5.9* 6.9 7.4 7.5 7.5  ALBUMIN 2.4* 2.8* 2.9* 3.1* 3.1*   CBC: Recent Labs  Lab 01/19/20 0115 01/20/20 0342 01/21/20 0154 01/22/20 0210 01/23/20 0219 01/24/20 0229 01/25/20 0308  WBC 12.5* 13.8* 16.3* 18.1* 21.8* 19.9* 14.2*  NEUTROABS 5.5 6.9 8.8* 10.3*  --   --   --   HGB 11.0* 12.1* 13.1 13.8 14.1 12.9* 11.7*  HCT 35.8* 37.8* 40.7 44.8 45.5 42.6 36.9*  MCV 91.3 90.9 90.2 90.9 90.3 92.2 92.3  PLT 308 323 376 400 403* 402* 326    CBG: Recent Labs  Lab 01/24/20 1200 01/24/20 1638 01/24/20 2136 01/25/20 0606 01/25/20 1136  GLUCAP 104* 115* 115* 92 108*    Recent Results (from the past 240 hour(s))   Culture, Urine     Status: None   Collection Time: 01/20/20  3:33 AM   Specimen: Urine, Catheterized  Result Value Ref Range Status   Specimen Description URINE, CATHETERIZED  Final   Special Requests NONE  Final   Culture   Final    NO GROWTH Performed at [facility], [address], [place], [place]    Report Status 01/21/2020 FINAL  Final  Culture, blood (routine x 2)     Status: None   Collection Time: 01/20/20  3:42 AM   Specimen: BLOOD RIGHT ARM  Result Value Ref Range Status   Specimen Description BLOOD RIGHT ARM  Final   Special Requests   Final    BOTTLES DRAWN AEROBIC AND ANAEROBIC Blood Culture adequate volume   Culture   Final    NO GROWTH 5 DAYS Performed at [facility], [address], [place], [place]    Report Status 01/25/2020 FINAL  Final  Culture, blood (routine x 2)     Status: None   Collection Time: 01/20/20  3:56 AM   Specimen: BLOOD RIGHT HAND  Result Value Ref Range Status   Specimen Description BLOOD RIGHT HAND  Final   Special Requests   Final    BOTTLES DRAWN AEROBIC AND ANAEROBIC Blood Culture adequate volume   Culture   Final    NO GROWTH 5 DAYS Performed at [facility], [address], [place], [place]    Report Status 01/25/2020 FINAL  Final      Time spent in discharge (includes decision making & examination of pt): 35 minutes  01/25/2020, 12:14 PM   03/24/2020, MD [facility] Office  [phone]

## 2020-01-27 DIAGNOSIS — K219 Gastro-esophageal reflux disease without esophagitis: Secondary | ICD-10-CM | POA: Diagnosis not present

## 2020-01-27 DIAGNOSIS — K5909 Other constipation: Secondary | ICD-10-CM | POA: Diagnosis not present

## 2020-01-27 DIAGNOSIS — M62422 Contracture of muscle, left upper arm: Secondary | ICD-10-CM | POA: Diagnosis not present

## 2020-01-27 DIAGNOSIS — A419 Sepsis, unspecified organism: Secondary | ICD-10-CM | POA: Diagnosis not present

## 2020-01-27 DIAGNOSIS — R6521 Severe sepsis with septic shock: Secondary | ICD-10-CM | POA: Diagnosis not present

## 2020-01-27 DIAGNOSIS — N319 Neuromuscular dysfunction of bladder, unspecified: Secondary | ICD-10-CM | POA: Diagnosis not present

## 2020-01-27 DIAGNOSIS — G35 Multiple sclerosis: Secondary | ICD-10-CM | POA: Diagnosis not present

## 2020-01-28 DIAGNOSIS — G35 Multiple sclerosis: Secondary | ICD-10-CM | POA: Diagnosis not present

## 2020-01-29 DIAGNOSIS — G35 Multiple sclerosis: Secondary | ICD-10-CM | POA: Diagnosis not present

## 2020-01-29 DIAGNOSIS — L8995 Pressure ulcer of unspecified site, unstageable: Secondary | ICD-10-CM | POA: Diagnosis not present

## 2020-01-29 DIAGNOSIS — M6281 Muscle weakness (generalized): Secondary | ICD-10-CM | POA: Diagnosis not present

## 2020-01-31 DIAGNOSIS — R293 Abnormal posture: Secondary | ICD-10-CM | POA: Diagnosis not present

## 2020-01-31 DIAGNOSIS — M24571 Contracture, right ankle: Secondary | ICD-10-CM | POA: Diagnosis not present

## 2020-01-31 DIAGNOSIS — M24572 Contracture, left ankle: Secondary | ICD-10-CM | POA: Diagnosis not present

## 2020-01-31 DIAGNOSIS — G35 Multiple sclerosis: Secondary | ICD-10-CM | POA: Diagnosis not present

## 2020-01-31 DIAGNOSIS — M24521 Contracture, right elbow: Secondary | ICD-10-CM | POA: Diagnosis not present

## 2020-01-31 DIAGNOSIS — M24522 Contracture, left elbow: Secondary | ICD-10-CM | POA: Diagnosis not present

## 2020-01-31 DIAGNOSIS — I69351 Hemiplegia and hemiparesis following cerebral infarction affecting right dominant side: Secondary | ICD-10-CM | POA: Diagnosis not present

## 2020-01-31 DIAGNOSIS — M62442 Contracture of muscle, left hand: Secondary | ICD-10-CM | POA: Diagnosis not present

## 2020-01-31 DIAGNOSIS — M62441 Contracture of muscle, right hand: Secondary | ICD-10-CM | POA: Diagnosis not present

## 2020-01-31 DIAGNOSIS — G8250 Quadriplegia, unspecified: Secondary | ICD-10-CM | POA: Diagnosis not present

## 2020-01-31 DIAGNOSIS — I69352 Hemiplegia and hemiparesis following cerebral infarction affecting left dominant side: Secondary | ICD-10-CM | POA: Diagnosis not present

## 2020-02-03 DIAGNOSIS — I69352 Hemiplegia and hemiparesis following cerebral infarction affecting left dominant side: Secondary | ICD-10-CM | POA: Diagnosis not present

## 2020-02-03 DIAGNOSIS — G35 Multiple sclerosis: Secondary | ICD-10-CM | POA: Diagnosis not present

## 2020-02-03 DIAGNOSIS — M24572 Contracture, left ankle: Secondary | ICD-10-CM | POA: Diagnosis not present

## 2020-02-03 DIAGNOSIS — M62441 Contracture of muscle, right hand: Secondary | ICD-10-CM | POA: Diagnosis not present

## 2020-02-03 DIAGNOSIS — M24571 Contracture, right ankle: Secondary | ICD-10-CM | POA: Diagnosis not present

## 2020-02-03 DIAGNOSIS — R293 Abnormal posture: Secondary | ICD-10-CM | POA: Diagnosis not present

## 2020-02-03 DIAGNOSIS — M24522 Contracture, left elbow: Secondary | ICD-10-CM | POA: Diagnosis not present

## 2020-02-03 DIAGNOSIS — M62442 Contracture of muscle, left hand: Secondary | ICD-10-CM | POA: Diagnosis not present

## 2020-02-03 DIAGNOSIS — R339 Retention of urine, unspecified: Secondary | ICD-10-CM | POA: Diagnosis not present

## 2020-02-03 DIAGNOSIS — G8250 Quadriplegia, unspecified: Secondary | ICD-10-CM | POA: Diagnosis not present

## 2020-02-03 DIAGNOSIS — I69351 Hemiplegia and hemiparesis following cerebral infarction affecting right dominant side: Secondary | ICD-10-CM | POA: Diagnosis not present

## 2020-02-03 DIAGNOSIS — M24521 Contracture, right elbow: Secondary | ICD-10-CM | POA: Diagnosis not present

## 2020-02-04 DIAGNOSIS — M24521 Contracture, right elbow: Secondary | ICD-10-CM | POA: Diagnosis not present

## 2020-02-04 DIAGNOSIS — I69351 Hemiplegia and hemiparesis following cerebral infarction affecting right dominant side: Secondary | ICD-10-CM | POA: Diagnosis not present

## 2020-02-04 DIAGNOSIS — M24571 Contracture, right ankle: Secondary | ICD-10-CM | POA: Diagnosis not present

## 2020-02-04 DIAGNOSIS — I69352 Hemiplegia and hemiparesis following cerebral infarction affecting left dominant side: Secondary | ICD-10-CM | POA: Diagnosis not present

## 2020-02-04 DIAGNOSIS — R293 Abnormal posture: Secondary | ICD-10-CM | POA: Diagnosis not present

## 2020-02-04 DIAGNOSIS — M62441 Contracture of muscle, right hand: Secondary | ICD-10-CM | POA: Diagnosis not present

## 2020-02-04 DIAGNOSIS — M24572 Contracture, left ankle: Secondary | ICD-10-CM | POA: Diagnosis not present

## 2020-02-04 DIAGNOSIS — M24522 Contracture, left elbow: Secondary | ICD-10-CM | POA: Diagnosis not present

## 2020-02-04 DIAGNOSIS — M62442 Contracture of muscle, left hand: Secondary | ICD-10-CM | POA: Diagnosis not present

## 2020-02-04 DIAGNOSIS — G8250 Quadriplegia, unspecified: Secondary | ICD-10-CM | POA: Diagnosis not present

## 2020-02-04 DIAGNOSIS — G35 Multiple sclerosis: Secondary | ICD-10-CM | POA: Diagnosis not present

## 2020-02-05 DIAGNOSIS — M24522 Contracture, left elbow: Secondary | ICD-10-CM | POA: Diagnosis not present

## 2020-02-05 DIAGNOSIS — L8995 Pressure ulcer of unspecified site, unstageable: Secondary | ICD-10-CM | POA: Diagnosis not present

## 2020-02-05 DIAGNOSIS — M6281 Muscle weakness (generalized): Secondary | ICD-10-CM | POA: Diagnosis not present

## 2020-02-05 DIAGNOSIS — M62442 Contracture of muscle, left hand: Secondary | ICD-10-CM | POA: Diagnosis not present

## 2020-02-05 DIAGNOSIS — M24571 Contracture, right ankle: Secondary | ICD-10-CM | POA: Diagnosis not present

## 2020-02-05 DIAGNOSIS — M62441 Contracture of muscle, right hand: Secondary | ICD-10-CM | POA: Diagnosis not present

## 2020-02-05 DIAGNOSIS — U071 COVID-19: Secondary | ICD-10-CM | POA: Diagnosis not present

## 2020-02-05 DIAGNOSIS — G35 Multiple sclerosis: Secondary | ICD-10-CM | POA: Diagnosis not present

## 2020-02-05 DIAGNOSIS — I69351 Hemiplegia and hemiparesis following cerebral infarction affecting right dominant side: Secondary | ICD-10-CM | POA: Diagnosis not present

## 2020-02-05 DIAGNOSIS — R293 Abnormal posture: Secondary | ICD-10-CM | POA: Diagnosis not present

## 2020-02-05 DIAGNOSIS — M24572 Contracture, left ankle: Secondary | ICD-10-CM | POA: Diagnosis not present

## 2020-02-05 DIAGNOSIS — G8250 Quadriplegia, unspecified: Secondary | ICD-10-CM | POA: Diagnosis not present

## 2020-02-05 DIAGNOSIS — M24521 Contracture, right elbow: Secondary | ICD-10-CM | POA: Diagnosis not present

## 2020-02-05 DIAGNOSIS — I69352 Hemiplegia and hemiparesis following cerebral infarction affecting left dominant side: Secondary | ICD-10-CM | POA: Diagnosis not present

## 2020-02-05 DIAGNOSIS — Z1152 Encounter for screening for COVID-19: Secondary | ICD-10-CM | POA: Diagnosis not present

## 2020-02-12 DIAGNOSIS — Z03818 Encounter for observation for suspected exposure to other biological agents ruled out: Secondary | ICD-10-CM | POA: Diagnosis not present

## 2020-02-12 DIAGNOSIS — U071 COVID-19: Secondary | ICD-10-CM | POA: Diagnosis not present

## 2020-02-17 DIAGNOSIS — M62421 Contracture of muscle, right upper arm: Secondary | ICD-10-CM | POA: Diagnosis not present

## 2020-02-17 DIAGNOSIS — G8250 Quadriplegia, unspecified: Secondary | ICD-10-CM | POA: Diagnosis not present

## 2020-02-17 DIAGNOSIS — I10 Essential (primary) hypertension: Secondary | ICD-10-CM | POA: Diagnosis not present

## 2020-02-17 DIAGNOSIS — U071 COVID-19: Secondary | ICD-10-CM | POA: Diagnosis not present

## 2020-02-17 DIAGNOSIS — Z978 Presence of other specified devices: Secondary | ICD-10-CM | POA: Diagnosis not present

## 2020-02-17 DIAGNOSIS — G35 Multiple sclerosis: Secondary | ICD-10-CM | POA: Diagnosis not present

## 2020-02-17 DIAGNOSIS — G500 Trigeminal neuralgia: Secondary | ICD-10-CM | POA: Diagnosis not present

## 2020-02-17 DIAGNOSIS — N319 Neuromuscular dysfunction of bladder, unspecified: Secondary | ICD-10-CM | POA: Diagnosis not present

## 2020-02-17 DIAGNOSIS — E785 Hyperlipidemia, unspecified: Secondary | ICD-10-CM | POA: Diagnosis not present

## 2020-02-18 DIAGNOSIS — I69351 Hemiplegia and hemiparesis following cerebral infarction affecting right dominant side: Secondary | ICD-10-CM | POA: Diagnosis not present

## 2020-02-18 DIAGNOSIS — M24572 Contracture, left ankle: Secondary | ICD-10-CM | POA: Diagnosis not present

## 2020-02-18 DIAGNOSIS — G8250 Quadriplegia, unspecified: Secondary | ICD-10-CM | POA: Diagnosis not present

## 2020-02-18 DIAGNOSIS — M24522 Contracture, left elbow: Secondary | ICD-10-CM | POA: Diagnosis not present

## 2020-02-18 DIAGNOSIS — M24571 Contracture, right ankle: Secondary | ICD-10-CM | POA: Diagnosis not present

## 2020-02-18 DIAGNOSIS — M62442 Contracture of muscle, left hand: Secondary | ICD-10-CM | POA: Diagnosis not present

## 2020-02-18 DIAGNOSIS — R293 Abnormal posture: Secondary | ICD-10-CM | POA: Diagnosis not present

## 2020-02-18 DIAGNOSIS — I69352 Hemiplegia and hemiparesis following cerebral infarction affecting left dominant side: Secondary | ICD-10-CM | POA: Diagnosis not present

## 2020-02-18 DIAGNOSIS — G35 Multiple sclerosis: Secondary | ICD-10-CM | POA: Diagnosis not present

## 2020-02-18 DIAGNOSIS — M62441 Contracture of muscle, right hand: Secondary | ICD-10-CM | POA: Diagnosis not present

## 2020-02-18 DIAGNOSIS — M24521 Contracture, right elbow: Secondary | ICD-10-CM | POA: Diagnosis not present

## 2020-02-19 DIAGNOSIS — G8250 Quadriplegia, unspecified: Secondary | ICD-10-CM | POA: Diagnosis not present

## 2020-02-19 DIAGNOSIS — M24521 Contracture, right elbow: Secondary | ICD-10-CM | POA: Diagnosis not present

## 2020-02-19 DIAGNOSIS — R293 Abnormal posture: Secondary | ICD-10-CM | POA: Diagnosis not present

## 2020-02-19 DIAGNOSIS — M62441 Contracture of muscle, right hand: Secondary | ICD-10-CM | POA: Diagnosis not present

## 2020-02-19 DIAGNOSIS — L8995 Pressure ulcer of unspecified site, unstageable: Secondary | ICD-10-CM | POA: Diagnosis not present

## 2020-02-19 DIAGNOSIS — M24571 Contracture, right ankle: Secondary | ICD-10-CM | POA: Diagnosis not present

## 2020-02-19 DIAGNOSIS — I69352 Hemiplegia and hemiparesis following cerebral infarction affecting left dominant side: Secondary | ICD-10-CM | POA: Diagnosis not present

## 2020-02-19 DIAGNOSIS — M6281 Muscle weakness (generalized): Secondary | ICD-10-CM | POA: Diagnosis not present

## 2020-02-19 DIAGNOSIS — M62442 Contracture of muscle, left hand: Secondary | ICD-10-CM | POA: Diagnosis not present

## 2020-02-19 DIAGNOSIS — G35 Multiple sclerosis: Secondary | ICD-10-CM | POA: Diagnosis not present

## 2020-02-19 DIAGNOSIS — M24572 Contracture, left ankle: Secondary | ICD-10-CM | POA: Diagnosis not present

## 2020-02-19 DIAGNOSIS — M24522 Contracture, left elbow: Secondary | ICD-10-CM | POA: Diagnosis not present

## 2020-02-19 DIAGNOSIS — I69351 Hemiplegia and hemiparesis following cerebral infarction affecting right dominant side: Secondary | ICD-10-CM | POA: Diagnosis not present

## 2020-02-20 DIAGNOSIS — M24571 Contracture, right ankle: Secondary | ICD-10-CM | POA: Diagnosis not present

## 2020-02-20 DIAGNOSIS — I69352 Hemiplegia and hemiparesis following cerebral infarction affecting left dominant side: Secondary | ICD-10-CM | POA: Diagnosis not present

## 2020-02-20 DIAGNOSIS — M62441 Contracture of muscle, right hand: Secondary | ICD-10-CM | POA: Diagnosis not present

## 2020-02-20 DIAGNOSIS — M24572 Contracture, left ankle: Secondary | ICD-10-CM | POA: Diagnosis not present

## 2020-02-20 DIAGNOSIS — U071 COVID-19: Secondary | ICD-10-CM | POA: Diagnosis not present

## 2020-02-20 DIAGNOSIS — R293 Abnormal posture: Secondary | ICD-10-CM | POA: Diagnosis not present

## 2020-02-20 DIAGNOSIS — G8250 Quadriplegia, unspecified: Secondary | ICD-10-CM | POA: Diagnosis not present

## 2020-02-20 DIAGNOSIS — M62442 Contracture of muscle, left hand: Secondary | ICD-10-CM | POA: Diagnosis not present

## 2020-02-20 DIAGNOSIS — G35 Multiple sclerosis: Secondary | ICD-10-CM | POA: Diagnosis not present

## 2020-02-20 DIAGNOSIS — M24521 Contracture, right elbow: Secondary | ICD-10-CM | POA: Diagnosis not present

## 2020-02-20 DIAGNOSIS — M24522 Contracture, left elbow: Secondary | ICD-10-CM | POA: Diagnosis not present

## 2020-02-20 DIAGNOSIS — I69351 Hemiplegia and hemiparesis following cerebral infarction affecting right dominant side: Secondary | ICD-10-CM | POA: Diagnosis not present

## 2020-02-21 DIAGNOSIS — M24521 Contracture, right elbow: Secondary | ICD-10-CM | POA: Diagnosis not present

## 2020-02-21 DIAGNOSIS — M62441 Contracture of muscle, right hand: Secondary | ICD-10-CM | POA: Diagnosis not present

## 2020-02-21 DIAGNOSIS — M24572 Contracture, left ankle: Secondary | ICD-10-CM | POA: Diagnosis not present

## 2020-02-21 DIAGNOSIS — M62442 Contracture of muscle, left hand: Secondary | ICD-10-CM | POA: Diagnosis not present

## 2020-02-21 DIAGNOSIS — G35 Multiple sclerosis: Secondary | ICD-10-CM | POA: Diagnosis not present

## 2020-02-21 DIAGNOSIS — M24522 Contracture, left elbow: Secondary | ICD-10-CM | POA: Diagnosis not present

## 2020-02-21 DIAGNOSIS — G8250 Quadriplegia, unspecified: Secondary | ICD-10-CM | POA: Diagnosis not present

## 2020-02-21 DIAGNOSIS — I69351 Hemiplegia and hemiparesis following cerebral infarction affecting right dominant side: Secondary | ICD-10-CM | POA: Diagnosis not present

## 2020-02-21 DIAGNOSIS — R293 Abnormal posture: Secondary | ICD-10-CM | POA: Diagnosis not present

## 2020-02-21 DIAGNOSIS — M24571 Contracture, right ankle: Secondary | ICD-10-CM | POA: Diagnosis not present

## 2020-02-21 DIAGNOSIS — N390 Urinary tract infection, site not specified: Secondary | ICD-10-CM | POA: Diagnosis not present

## 2020-02-21 DIAGNOSIS — I69352 Hemiplegia and hemiparesis following cerebral infarction affecting left dominant side: Secondary | ICD-10-CM | POA: Diagnosis not present

## 2020-02-24 DIAGNOSIS — M62442 Contracture of muscle, left hand: Secondary | ICD-10-CM | POA: Diagnosis not present

## 2020-02-24 DIAGNOSIS — I69351 Hemiplegia and hemiparesis following cerebral infarction affecting right dominant side: Secondary | ICD-10-CM | POA: Diagnosis not present

## 2020-02-24 DIAGNOSIS — G8250 Quadriplegia, unspecified: Secondary | ICD-10-CM | POA: Diagnosis not present

## 2020-02-24 DIAGNOSIS — M62441 Contracture of muscle, right hand: Secondary | ICD-10-CM | POA: Diagnosis not present

## 2020-02-24 DIAGNOSIS — I69352 Hemiplegia and hemiparesis following cerebral infarction affecting left dominant side: Secondary | ICD-10-CM | POA: Diagnosis not present

## 2020-02-24 DIAGNOSIS — G35 Multiple sclerosis: Secondary | ICD-10-CM | POA: Diagnosis not present

## 2020-02-24 DIAGNOSIS — M24571 Contracture, right ankle: Secondary | ICD-10-CM | POA: Diagnosis not present

## 2020-02-24 DIAGNOSIS — M24522 Contracture, left elbow: Secondary | ICD-10-CM | POA: Diagnosis not present

## 2020-02-24 DIAGNOSIS — M24521 Contracture, right elbow: Secondary | ICD-10-CM | POA: Diagnosis not present

## 2020-02-24 DIAGNOSIS — R293 Abnormal posture: Secondary | ICD-10-CM | POA: Diagnosis not present

## 2020-02-24 DIAGNOSIS — M24572 Contracture, left ankle: Secondary | ICD-10-CM | POA: Diagnosis not present

## 2020-02-25 DIAGNOSIS — M24522 Contracture, left elbow: Secondary | ICD-10-CM | POA: Diagnosis not present

## 2020-02-25 DIAGNOSIS — R293 Abnormal posture: Secondary | ICD-10-CM | POA: Diagnosis not present

## 2020-02-25 DIAGNOSIS — G35 Multiple sclerosis: Secondary | ICD-10-CM | POA: Diagnosis not present

## 2020-02-25 DIAGNOSIS — R488 Other symbolic dysfunctions: Secondary | ICD-10-CM | POA: Diagnosis not present

## 2020-02-25 DIAGNOSIS — I69352 Hemiplegia and hemiparesis following cerebral infarction affecting left dominant side: Secondary | ICD-10-CM | POA: Diagnosis not present

## 2020-02-25 DIAGNOSIS — M24572 Contracture, left ankle: Secondary | ICD-10-CM | POA: Diagnosis not present

## 2020-02-25 DIAGNOSIS — M24571 Contracture, right ankle: Secondary | ICD-10-CM | POA: Diagnosis not present

## 2020-02-25 DIAGNOSIS — G8250 Quadriplegia, unspecified: Secondary | ICD-10-CM | POA: Diagnosis not present

## 2020-02-25 DIAGNOSIS — M24521 Contracture, right elbow: Secondary | ICD-10-CM | POA: Diagnosis not present

## 2020-02-25 DIAGNOSIS — R1312 Dysphagia, oropharyngeal phase: Secondary | ICD-10-CM | POA: Diagnosis not present

## 2020-02-25 DIAGNOSIS — I69351 Hemiplegia and hemiparesis following cerebral infarction affecting right dominant side: Secondary | ICD-10-CM | POA: Diagnosis not present

## 2020-02-26 DIAGNOSIS — M24571 Contracture, right ankle: Secondary | ICD-10-CM | POA: Diagnosis not present

## 2020-02-26 DIAGNOSIS — G35 Multiple sclerosis: Secondary | ICD-10-CM | POA: Diagnosis not present

## 2020-02-26 DIAGNOSIS — R488 Other symbolic dysfunctions: Secondary | ICD-10-CM | POA: Diagnosis not present

## 2020-02-26 DIAGNOSIS — R1312 Dysphagia, oropharyngeal phase: Secondary | ICD-10-CM | POA: Diagnosis not present

## 2020-02-26 DIAGNOSIS — G8250 Quadriplegia, unspecified: Secondary | ICD-10-CM | POA: Diagnosis not present

## 2020-02-26 DIAGNOSIS — I69351 Hemiplegia and hemiparesis following cerebral infarction affecting right dominant side: Secondary | ICD-10-CM | POA: Diagnosis not present

## 2020-02-26 DIAGNOSIS — M24522 Contracture, left elbow: Secondary | ICD-10-CM | POA: Diagnosis not present

## 2020-02-26 DIAGNOSIS — I69352 Hemiplegia and hemiparesis following cerebral infarction affecting left dominant side: Secondary | ICD-10-CM | POA: Diagnosis not present

## 2020-02-26 DIAGNOSIS — R293 Abnormal posture: Secondary | ICD-10-CM | POA: Diagnosis not present

## 2020-02-26 DIAGNOSIS — M24572 Contracture, left ankle: Secondary | ICD-10-CM | POA: Diagnosis not present

## 2020-02-26 DIAGNOSIS — M24521 Contracture, right elbow: Secondary | ICD-10-CM | POA: Diagnosis not present

## 2020-02-27 DIAGNOSIS — M24522 Contracture, left elbow: Secondary | ICD-10-CM | POA: Diagnosis not present

## 2020-02-27 DIAGNOSIS — R488 Other symbolic dysfunctions: Secondary | ICD-10-CM | POA: Diagnosis not present

## 2020-02-27 DIAGNOSIS — I69352 Hemiplegia and hemiparesis following cerebral infarction affecting left dominant side: Secondary | ICD-10-CM | POA: Diagnosis not present

## 2020-02-27 DIAGNOSIS — R1312 Dysphagia, oropharyngeal phase: Secondary | ICD-10-CM | POA: Diagnosis not present

## 2020-02-27 DIAGNOSIS — R293 Abnormal posture: Secondary | ICD-10-CM | POA: Diagnosis not present

## 2020-02-27 DIAGNOSIS — M24571 Contracture, right ankle: Secondary | ICD-10-CM | POA: Diagnosis not present

## 2020-02-27 DIAGNOSIS — I69351 Hemiplegia and hemiparesis following cerebral infarction affecting right dominant side: Secondary | ICD-10-CM | POA: Diagnosis not present

## 2020-02-27 DIAGNOSIS — M24521 Contracture, right elbow: Secondary | ICD-10-CM | POA: Diagnosis not present

## 2020-02-27 DIAGNOSIS — G35 Multiple sclerosis: Secondary | ICD-10-CM | POA: Diagnosis not present

## 2020-02-27 DIAGNOSIS — G8250 Quadriplegia, unspecified: Secondary | ICD-10-CM | POA: Diagnosis not present

## 2020-02-27 DIAGNOSIS — M24572 Contracture, left ankle: Secondary | ICD-10-CM | POA: Diagnosis not present

## 2020-02-28 DIAGNOSIS — I69351 Hemiplegia and hemiparesis following cerebral infarction affecting right dominant side: Secondary | ICD-10-CM | POA: Diagnosis not present

## 2020-02-28 DIAGNOSIS — R1312 Dysphagia, oropharyngeal phase: Secondary | ICD-10-CM | POA: Diagnosis not present

## 2020-02-28 DIAGNOSIS — M24571 Contracture, right ankle: Secondary | ICD-10-CM | POA: Diagnosis not present

## 2020-02-28 DIAGNOSIS — G8250 Quadriplegia, unspecified: Secondary | ICD-10-CM | POA: Diagnosis not present

## 2020-02-28 DIAGNOSIS — M24521 Contracture, right elbow: Secondary | ICD-10-CM | POA: Diagnosis not present

## 2020-02-28 DIAGNOSIS — M24572 Contracture, left ankle: Secondary | ICD-10-CM | POA: Diagnosis not present

## 2020-02-28 DIAGNOSIS — R488 Other symbolic dysfunctions: Secondary | ICD-10-CM | POA: Diagnosis not present

## 2020-02-28 DIAGNOSIS — R293 Abnormal posture: Secondary | ICD-10-CM | POA: Diagnosis not present

## 2020-02-28 DIAGNOSIS — G35 Multiple sclerosis: Secondary | ICD-10-CM | POA: Diagnosis not present

## 2020-02-28 DIAGNOSIS — I69352 Hemiplegia and hemiparesis following cerebral infarction affecting left dominant side: Secondary | ICD-10-CM | POA: Diagnosis not present

## 2020-02-28 DIAGNOSIS — M24522 Contracture, left elbow: Secondary | ICD-10-CM | POA: Diagnosis not present

## 2020-03-02 DIAGNOSIS — R339 Retention of urine, unspecified: Secondary | ICD-10-CM | POA: Diagnosis not present

## 2020-03-02 DIAGNOSIS — I10 Essential (primary) hypertension: Secondary | ICD-10-CM | POA: Diagnosis not present

## 2020-03-02 DIAGNOSIS — Z960 Presence of urogenital implants: Secondary | ICD-10-CM | POA: Diagnosis not present

## 2020-03-02 DIAGNOSIS — I69351 Hemiplegia and hemiparesis following cerebral infarction affecting right dominant side: Secondary | ICD-10-CM | POA: Diagnosis not present

## 2020-03-02 DIAGNOSIS — M24522 Contracture, left elbow: Secondary | ICD-10-CM | POA: Diagnosis not present

## 2020-03-02 DIAGNOSIS — M24572 Contracture, left ankle: Secondary | ICD-10-CM | POA: Diagnosis not present

## 2020-03-02 DIAGNOSIS — M24521 Contracture, right elbow: Secondary | ICD-10-CM | POA: Diagnosis not present

## 2020-03-02 DIAGNOSIS — G500 Trigeminal neuralgia: Secondary | ICD-10-CM | POA: Diagnosis not present

## 2020-03-02 DIAGNOSIS — M24571 Contracture, right ankle: Secondary | ICD-10-CM | POA: Diagnosis not present

## 2020-03-02 DIAGNOSIS — G8250 Quadriplegia, unspecified: Secondary | ICD-10-CM | POA: Diagnosis not present

## 2020-03-02 DIAGNOSIS — G35 Multiple sclerosis: Secondary | ICD-10-CM | POA: Diagnosis not present

## 2020-03-02 DIAGNOSIS — I69352 Hemiplegia and hemiparesis following cerebral infarction affecting left dominant side: Secondary | ICD-10-CM | POA: Diagnosis not present

## 2020-03-02 DIAGNOSIS — E785 Hyperlipidemia, unspecified: Secondary | ICD-10-CM | POA: Diagnosis not present

## 2020-03-02 DIAGNOSIS — R1312 Dysphagia, oropharyngeal phase: Secondary | ICD-10-CM | POA: Diagnosis not present

## 2020-03-02 DIAGNOSIS — R293 Abnormal posture: Secondary | ICD-10-CM | POA: Diagnosis not present

## 2020-03-02 DIAGNOSIS — R488 Other symbolic dysfunctions: Secondary | ICD-10-CM | POA: Diagnosis not present

## 2020-03-02 DIAGNOSIS — K219 Gastro-esophageal reflux disease without esophagitis: Secondary | ICD-10-CM | POA: Diagnosis not present

## 2020-03-03 DIAGNOSIS — M24571 Contracture, right ankle: Secondary | ICD-10-CM | POA: Diagnosis not present

## 2020-03-03 DIAGNOSIS — M24521 Contracture, right elbow: Secondary | ICD-10-CM | POA: Diagnosis not present

## 2020-03-03 DIAGNOSIS — I69351 Hemiplegia and hemiparesis following cerebral infarction affecting right dominant side: Secondary | ICD-10-CM | POA: Diagnosis not present

## 2020-03-03 DIAGNOSIS — R488 Other symbolic dysfunctions: Secondary | ICD-10-CM | POA: Diagnosis not present

## 2020-03-03 DIAGNOSIS — R293 Abnormal posture: Secondary | ICD-10-CM | POA: Diagnosis not present

## 2020-03-03 DIAGNOSIS — G8250 Quadriplegia, unspecified: Secondary | ICD-10-CM | POA: Diagnosis not present

## 2020-03-03 DIAGNOSIS — I69352 Hemiplegia and hemiparesis following cerebral infarction affecting left dominant side: Secondary | ICD-10-CM | POA: Diagnosis not present

## 2020-03-03 DIAGNOSIS — M24522 Contracture, left elbow: Secondary | ICD-10-CM | POA: Diagnosis not present

## 2020-03-03 DIAGNOSIS — R1312 Dysphagia, oropharyngeal phase: Secondary | ICD-10-CM | POA: Diagnosis not present

## 2020-03-03 DIAGNOSIS — M24572 Contracture, left ankle: Secondary | ICD-10-CM | POA: Diagnosis not present

## 2020-03-03 DIAGNOSIS — G35 Multiple sclerosis: Secondary | ICD-10-CM | POA: Diagnosis not present

## 2020-03-04 DIAGNOSIS — R1312 Dysphagia, oropharyngeal phase: Secondary | ICD-10-CM | POA: Diagnosis not present

## 2020-03-04 DIAGNOSIS — M24521 Contracture, right elbow: Secondary | ICD-10-CM | POA: Diagnosis not present

## 2020-03-04 DIAGNOSIS — G8250 Quadriplegia, unspecified: Secondary | ICD-10-CM | POA: Diagnosis not present

## 2020-03-04 DIAGNOSIS — M6281 Muscle weakness (generalized): Secondary | ICD-10-CM | POA: Diagnosis not present

## 2020-03-04 DIAGNOSIS — R488 Other symbolic dysfunctions: Secondary | ICD-10-CM | POA: Diagnosis not present

## 2020-03-04 DIAGNOSIS — I69351 Hemiplegia and hemiparesis following cerebral infarction affecting right dominant side: Secondary | ICD-10-CM | POA: Diagnosis not present

## 2020-03-04 DIAGNOSIS — L8995 Pressure ulcer of unspecified site, unstageable: Secondary | ICD-10-CM | POA: Diagnosis not present

## 2020-03-04 DIAGNOSIS — M24572 Contracture, left ankle: Secondary | ICD-10-CM | POA: Diagnosis not present

## 2020-03-04 DIAGNOSIS — M24522 Contracture, left elbow: Secondary | ICD-10-CM | POA: Diagnosis not present

## 2020-03-04 DIAGNOSIS — M24571 Contracture, right ankle: Secondary | ICD-10-CM | POA: Diagnosis not present

## 2020-03-04 DIAGNOSIS — G35 Multiple sclerosis: Secondary | ICD-10-CM | POA: Diagnosis not present

## 2020-03-04 DIAGNOSIS — R293 Abnormal posture: Secondary | ICD-10-CM | POA: Diagnosis not present

## 2020-03-04 DIAGNOSIS — I69352 Hemiplegia and hemiparesis following cerebral infarction affecting left dominant side: Secondary | ICD-10-CM | POA: Diagnosis not present

## 2020-03-05 ENCOUNTER — Telehealth: Payer: Self-pay

## 2020-03-05 DIAGNOSIS — R1312 Dysphagia, oropharyngeal phase: Secondary | ICD-10-CM | POA: Diagnosis not present

## 2020-03-05 DIAGNOSIS — R293 Abnormal posture: Secondary | ICD-10-CM | POA: Diagnosis not present

## 2020-03-05 DIAGNOSIS — M24521 Contracture, right elbow: Secondary | ICD-10-CM | POA: Diagnosis not present

## 2020-03-05 DIAGNOSIS — G8250 Quadriplegia, unspecified: Secondary | ICD-10-CM | POA: Diagnosis not present

## 2020-03-05 DIAGNOSIS — G35 Multiple sclerosis: Secondary | ICD-10-CM | POA: Diagnosis not present

## 2020-03-05 DIAGNOSIS — M24522 Contracture, left elbow: Secondary | ICD-10-CM | POA: Diagnosis not present

## 2020-03-05 DIAGNOSIS — I69351 Hemiplegia and hemiparesis following cerebral infarction affecting right dominant side: Secondary | ICD-10-CM | POA: Diagnosis not present

## 2020-03-05 DIAGNOSIS — R488 Other symbolic dysfunctions: Secondary | ICD-10-CM | POA: Diagnosis not present

## 2020-03-05 DIAGNOSIS — I69352 Hemiplegia and hemiparesis following cerebral infarction affecting left dominant side: Secondary | ICD-10-CM | POA: Diagnosis not present

## 2020-03-05 DIAGNOSIS — M24572 Contracture, left ankle: Secondary | ICD-10-CM | POA: Diagnosis not present

## 2020-03-05 DIAGNOSIS — M24571 Contracture, right ankle: Secondary | ICD-10-CM | POA: Diagnosis not present

## 2020-03-05 MED ORDER — KETOCONAZOLE 2 % EX SHAM: 1.0000 "application " | MEDICATED_SHAMPOO | CUTANEOUS | 11 refills | Status: DC

## 2020-03-05 NOTE — Telephone Encounter (Signed)
Vm from pt's sister, Graciella Belton (on dpr), requesting new rx for pt's face/head from Dr. Ermalene Searing.  Dianne cannot remember the name of med but says it was a pink lotion-type cream.  Requesting rx be sent to [address]

## 2020-03-05 NOTE — Telephone Encounter (Signed)
I think she is referring to Ketoconazole 2% Shampoo.

## 2020-03-05 NOTE — Addendum Note (Signed)
Addended byKerby Nora E on: 03/05/2020 12:05 PM   Modules accepted: Orders

## 2020-03-05 NOTE — Telephone Encounter (Signed)
Okay, sent.

## 2020-03-05 NOTE — Telephone Encounter (Signed)
I am not sure what this is. Have her call pharmacy where this was last prescribed and they can send refill request.

## 2020-03-06 DIAGNOSIS — R1312 Dysphagia, oropharyngeal phase: Secondary | ICD-10-CM | POA: Diagnosis not present

## 2020-03-06 DIAGNOSIS — G8250 Quadriplegia, unspecified: Secondary | ICD-10-CM | POA: Diagnosis not present

## 2020-03-06 DIAGNOSIS — Z960 Presence of urogenital implants: Secondary | ICD-10-CM | POA: Diagnosis not present

## 2020-03-06 DIAGNOSIS — R6884 Jaw pain: Secondary | ICD-10-CM | POA: Diagnosis not present

## 2020-03-06 DIAGNOSIS — R488 Other symbolic dysfunctions: Secondary | ICD-10-CM | POA: Diagnosis not present

## 2020-03-06 DIAGNOSIS — M62421 Contracture of muscle, right upper arm: Secondary | ICD-10-CM | POA: Diagnosis not present

## 2020-03-06 DIAGNOSIS — E785 Hyperlipidemia, unspecified: Secondary | ICD-10-CM | POA: Diagnosis not present

## 2020-03-06 DIAGNOSIS — M24571 Contracture, right ankle: Secondary | ICD-10-CM | POA: Diagnosis not present

## 2020-03-06 DIAGNOSIS — M24572 Contracture, left ankle: Secondary | ICD-10-CM | POA: Diagnosis not present

## 2020-03-06 DIAGNOSIS — M24522 Contracture, left elbow: Secondary | ICD-10-CM | POA: Diagnosis not present

## 2020-03-06 DIAGNOSIS — I69352 Hemiplegia and hemiparesis following cerebral infarction affecting left dominant side: Secondary | ICD-10-CM | POA: Diagnosis not present

## 2020-03-06 DIAGNOSIS — M24521 Contracture, right elbow: Secondary | ICD-10-CM | POA: Diagnosis not present

## 2020-03-06 DIAGNOSIS — G500 Trigeminal neuralgia: Secondary | ICD-10-CM | POA: Diagnosis not present

## 2020-03-06 DIAGNOSIS — G35 Multiple sclerosis: Secondary | ICD-10-CM | POA: Diagnosis not present

## 2020-03-06 DIAGNOSIS — I69351 Hemiplegia and hemiparesis following cerebral infarction affecting right dominant side: Secondary | ICD-10-CM | POA: Diagnosis not present

## 2020-03-06 DIAGNOSIS — I10 Essential (primary) hypertension: Secondary | ICD-10-CM | POA: Diagnosis not present

## 2020-03-06 DIAGNOSIS — R293 Abnormal posture: Secondary | ICD-10-CM | POA: Diagnosis not present

## 2020-03-09 DIAGNOSIS — M24571 Contracture, right ankle: Secondary | ICD-10-CM | POA: Diagnosis not present

## 2020-03-09 DIAGNOSIS — R1312 Dysphagia, oropharyngeal phase: Secondary | ICD-10-CM | POA: Diagnosis not present

## 2020-03-09 DIAGNOSIS — G8250 Quadriplegia, unspecified: Secondary | ICD-10-CM | POA: Diagnosis not present

## 2020-03-09 DIAGNOSIS — M24521 Contracture, right elbow: Secondary | ICD-10-CM | POA: Diagnosis not present

## 2020-03-09 DIAGNOSIS — R293 Abnormal posture: Secondary | ICD-10-CM | POA: Diagnosis not present

## 2020-03-09 DIAGNOSIS — I69352 Hemiplegia and hemiparesis following cerebral infarction affecting left dominant side: Secondary | ICD-10-CM | POA: Diagnosis not present

## 2020-03-09 DIAGNOSIS — M24522 Contracture, left elbow: Secondary | ICD-10-CM | POA: Diagnosis not present

## 2020-03-09 DIAGNOSIS — G35 Multiple sclerosis: Secondary | ICD-10-CM | POA: Diagnosis not present

## 2020-03-09 DIAGNOSIS — I69351 Hemiplegia and hemiparesis following cerebral infarction affecting right dominant side: Secondary | ICD-10-CM | POA: Diagnosis not present

## 2020-03-09 DIAGNOSIS — R488 Other symbolic dysfunctions: Secondary | ICD-10-CM | POA: Diagnosis not present

## 2020-03-09 DIAGNOSIS — M24572 Contracture, left ankle: Secondary | ICD-10-CM | POA: Diagnosis not present

## 2020-03-10 DIAGNOSIS — G8250 Quadriplegia, unspecified: Secondary | ICD-10-CM | POA: Diagnosis not present

## 2020-03-10 DIAGNOSIS — I69351 Hemiplegia and hemiparesis following cerebral infarction affecting right dominant side: Secondary | ICD-10-CM | POA: Diagnosis not present

## 2020-03-10 DIAGNOSIS — R1312 Dysphagia, oropharyngeal phase: Secondary | ICD-10-CM | POA: Diagnosis not present

## 2020-03-10 DIAGNOSIS — G35 Multiple sclerosis: Secondary | ICD-10-CM | POA: Diagnosis not present

## 2020-03-10 DIAGNOSIS — M24522 Contracture, left elbow: Secondary | ICD-10-CM | POA: Diagnosis not present

## 2020-03-10 DIAGNOSIS — M24571 Contracture, right ankle: Secondary | ICD-10-CM | POA: Diagnosis not present

## 2020-03-10 DIAGNOSIS — M24572 Contracture, left ankle: Secondary | ICD-10-CM | POA: Diagnosis not present

## 2020-03-10 DIAGNOSIS — I69352 Hemiplegia and hemiparesis following cerebral infarction affecting left dominant side: Secondary | ICD-10-CM | POA: Diagnosis not present

## 2020-03-10 DIAGNOSIS — M24521 Contracture, right elbow: Secondary | ICD-10-CM | POA: Diagnosis not present

## 2020-03-10 DIAGNOSIS — R488 Other symbolic dysfunctions: Secondary | ICD-10-CM | POA: Diagnosis not present

## 2020-03-10 DIAGNOSIS — R293 Abnormal posture: Secondary | ICD-10-CM | POA: Diagnosis not present

## 2020-03-11 DIAGNOSIS — R293 Abnormal posture: Secondary | ICD-10-CM | POA: Diagnosis not present

## 2020-03-11 DIAGNOSIS — M6281 Muscle weakness (generalized): Secondary | ICD-10-CM | POA: Diagnosis not present

## 2020-03-11 DIAGNOSIS — M24572 Contracture, left ankle: Secondary | ICD-10-CM | POA: Diagnosis not present

## 2020-03-11 DIAGNOSIS — R1312 Dysphagia, oropharyngeal phase: Secondary | ICD-10-CM | POA: Diagnosis not present

## 2020-03-11 DIAGNOSIS — M24571 Contracture, right ankle: Secondary | ICD-10-CM | POA: Diagnosis not present

## 2020-03-11 DIAGNOSIS — G8250 Quadriplegia, unspecified: Secondary | ICD-10-CM | POA: Diagnosis not present

## 2020-03-11 DIAGNOSIS — M24522 Contracture, left elbow: Secondary | ICD-10-CM | POA: Diagnosis not present

## 2020-03-11 DIAGNOSIS — I69352 Hemiplegia and hemiparesis following cerebral infarction affecting left dominant side: Secondary | ICD-10-CM | POA: Diagnosis not present

## 2020-03-11 DIAGNOSIS — G35 Multiple sclerosis: Secondary | ICD-10-CM | POA: Diagnosis not present

## 2020-03-11 DIAGNOSIS — R488 Other symbolic dysfunctions: Secondary | ICD-10-CM | POA: Diagnosis not present

## 2020-03-11 DIAGNOSIS — M24521 Contracture, right elbow: Secondary | ICD-10-CM | POA: Diagnosis not present

## 2020-03-11 DIAGNOSIS — L304 Erythema intertrigo: Secondary | ICD-10-CM | POA: Diagnosis not present

## 2020-03-11 DIAGNOSIS — I69351 Hemiplegia and hemiparesis following cerebral infarction affecting right dominant side: Secondary | ICD-10-CM | POA: Diagnosis not present

## 2020-03-12 DIAGNOSIS — M24521 Contracture, right elbow: Secondary | ICD-10-CM | POA: Diagnosis not present

## 2020-03-12 DIAGNOSIS — R293 Abnormal posture: Secondary | ICD-10-CM | POA: Diagnosis not present

## 2020-03-12 DIAGNOSIS — R1312 Dysphagia, oropharyngeal phase: Secondary | ICD-10-CM | POA: Diagnosis not present

## 2020-03-12 DIAGNOSIS — I69351 Hemiplegia and hemiparesis following cerebral infarction affecting right dominant side: Secondary | ICD-10-CM | POA: Diagnosis not present

## 2020-03-12 DIAGNOSIS — M24522 Contracture, left elbow: Secondary | ICD-10-CM | POA: Diagnosis not present

## 2020-03-12 DIAGNOSIS — G35 Multiple sclerosis: Secondary | ICD-10-CM | POA: Diagnosis not present

## 2020-03-12 DIAGNOSIS — M24571 Contracture, right ankle: Secondary | ICD-10-CM | POA: Diagnosis not present

## 2020-03-12 DIAGNOSIS — R488 Other symbolic dysfunctions: Secondary | ICD-10-CM | POA: Diagnosis not present

## 2020-03-12 DIAGNOSIS — I69352 Hemiplegia and hemiparesis following cerebral infarction affecting left dominant side: Secondary | ICD-10-CM | POA: Diagnosis not present

## 2020-03-12 DIAGNOSIS — G8250 Quadriplegia, unspecified: Secondary | ICD-10-CM | POA: Diagnosis not present

## 2020-03-12 DIAGNOSIS — M24572 Contracture, left ankle: Secondary | ICD-10-CM | POA: Diagnosis not present

## 2020-03-13 DIAGNOSIS — G8250 Quadriplegia, unspecified: Secondary | ICD-10-CM | POA: Diagnosis not present

## 2020-03-13 DIAGNOSIS — I69352 Hemiplegia and hemiparesis following cerebral infarction affecting left dominant side: Secondary | ICD-10-CM | POA: Diagnosis not present

## 2020-03-13 DIAGNOSIS — I69351 Hemiplegia and hemiparesis following cerebral infarction affecting right dominant side: Secondary | ICD-10-CM | POA: Diagnosis not present

## 2020-03-13 DIAGNOSIS — R1312 Dysphagia, oropharyngeal phase: Secondary | ICD-10-CM | POA: Diagnosis not present

## 2020-03-13 DIAGNOSIS — M24521 Contracture, right elbow: Secondary | ICD-10-CM | POA: Diagnosis not present

## 2020-03-13 DIAGNOSIS — R488 Other symbolic dysfunctions: Secondary | ICD-10-CM | POA: Diagnosis not present

## 2020-03-13 DIAGNOSIS — G35 Multiple sclerosis: Secondary | ICD-10-CM | POA: Diagnosis not present

## 2020-03-13 DIAGNOSIS — R293 Abnormal posture: Secondary | ICD-10-CM | POA: Diagnosis not present

## 2020-03-13 DIAGNOSIS — M24522 Contracture, left elbow: Secondary | ICD-10-CM | POA: Diagnosis not present

## 2020-03-13 DIAGNOSIS — M24571 Contracture, right ankle: Secondary | ICD-10-CM | POA: Diagnosis not present

## 2020-03-13 DIAGNOSIS — M24572 Contracture, left ankle: Secondary | ICD-10-CM | POA: Diagnosis not present

## 2020-03-16 DIAGNOSIS — I69352 Hemiplegia and hemiparesis following cerebral infarction affecting left dominant side: Secondary | ICD-10-CM | POA: Diagnosis not present

## 2020-03-16 DIAGNOSIS — I69351 Hemiplegia and hemiparesis following cerebral infarction affecting right dominant side: Secondary | ICD-10-CM | POA: Diagnosis not present

## 2020-03-16 DIAGNOSIS — G35 Multiple sclerosis: Secondary | ICD-10-CM | POA: Diagnosis not present

## 2020-03-16 DIAGNOSIS — M24571 Contracture, right ankle: Secondary | ICD-10-CM | POA: Diagnosis not present

## 2020-03-16 DIAGNOSIS — R1312 Dysphagia, oropharyngeal phase: Secondary | ICD-10-CM | POA: Diagnosis not present

## 2020-03-16 DIAGNOSIS — M24522 Contracture, left elbow: Secondary | ICD-10-CM | POA: Diagnosis not present

## 2020-03-16 DIAGNOSIS — G8250 Quadriplegia, unspecified: Secondary | ICD-10-CM | POA: Diagnosis not present

## 2020-03-16 DIAGNOSIS — M24521 Contracture, right elbow: Secondary | ICD-10-CM | POA: Diagnosis not present

## 2020-03-16 DIAGNOSIS — R293 Abnormal posture: Secondary | ICD-10-CM | POA: Diagnosis not present

## 2020-03-16 DIAGNOSIS — R488 Other symbolic dysfunctions: Secondary | ICD-10-CM | POA: Diagnosis not present

## 2020-03-16 DIAGNOSIS — M24572 Contracture, left ankle: Secondary | ICD-10-CM | POA: Diagnosis not present

## 2020-03-17 DIAGNOSIS — I69352 Hemiplegia and hemiparesis following cerebral infarction affecting left dominant side: Secondary | ICD-10-CM | POA: Diagnosis not present

## 2020-03-17 DIAGNOSIS — R488 Other symbolic dysfunctions: Secondary | ICD-10-CM | POA: Diagnosis not present

## 2020-03-17 DIAGNOSIS — G8250 Quadriplegia, unspecified: Secondary | ICD-10-CM | POA: Diagnosis not present

## 2020-03-17 DIAGNOSIS — G35 Multiple sclerosis: Secondary | ICD-10-CM | POA: Diagnosis not present

## 2020-03-17 DIAGNOSIS — M24521 Contracture, right elbow: Secondary | ICD-10-CM | POA: Diagnosis not present

## 2020-03-17 DIAGNOSIS — L98411 Non-pressure chronic ulcer of buttock limited to breakdown of skin: Secondary | ICD-10-CM | POA: Diagnosis not present

## 2020-03-17 DIAGNOSIS — I69351 Hemiplegia and hemiparesis following cerebral infarction affecting right dominant side: Secondary | ICD-10-CM | POA: Diagnosis not present

## 2020-03-17 DIAGNOSIS — R1312 Dysphagia, oropharyngeal phase: Secondary | ICD-10-CM | POA: Diagnosis not present

## 2020-03-17 DIAGNOSIS — M24571 Contracture, right ankle: Secondary | ICD-10-CM | POA: Diagnosis not present

## 2020-03-17 DIAGNOSIS — M24572 Contracture, left ankle: Secondary | ICD-10-CM | POA: Diagnosis not present

## 2020-03-17 DIAGNOSIS — M24522 Contracture, left elbow: Secondary | ICD-10-CM | POA: Diagnosis not present

## 2020-03-17 DIAGNOSIS — R293 Abnormal posture: Secondary | ICD-10-CM | POA: Diagnosis not present

## 2020-03-18 ENCOUNTER — Other Ambulatory Visit: Payer: Self-pay | Admitting: *Deleted

## 2020-03-18 ENCOUNTER — Telehealth: Payer: Self-pay | Admitting: Family Medicine

## 2020-03-18 DIAGNOSIS — I69351 Hemiplegia and hemiparesis following cerebral infarction affecting right dominant side: Secondary | ICD-10-CM | POA: Diagnosis not present

## 2020-03-18 DIAGNOSIS — M24522 Contracture, left elbow: Secondary | ICD-10-CM | POA: Diagnosis not present

## 2020-03-18 DIAGNOSIS — R1312 Dysphagia, oropharyngeal phase: Secondary | ICD-10-CM | POA: Diagnosis not present

## 2020-03-18 DIAGNOSIS — R293 Abnormal posture: Secondary | ICD-10-CM | POA: Diagnosis not present

## 2020-03-18 DIAGNOSIS — L304 Erythema intertrigo: Secondary | ICD-10-CM | POA: Diagnosis not present

## 2020-03-18 DIAGNOSIS — M6281 Muscle weakness (generalized): Secondary | ICD-10-CM | POA: Diagnosis not present

## 2020-03-18 DIAGNOSIS — R488 Other symbolic dysfunctions: Secondary | ICD-10-CM | POA: Diagnosis not present

## 2020-03-18 DIAGNOSIS — M24572 Contracture, left ankle: Secondary | ICD-10-CM | POA: Diagnosis not present

## 2020-03-18 DIAGNOSIS — I69352 Hemiplegia and hemiparesis following cerebral infarction affecting left dominant side: Secondary | ICD-10-CM | POA: Diagnosis not present

## 2020-03-18 DIAGNOSIS — M24571 Contracture, right ankle: Secondary | ICD-10-CM | POA: Diagnosis not present

## 2020-03-18 DIAGNOSIS — G35 Multiple sclerosis: Secondary | ICD-10-CM | POA: Diagnosis not present

## 2020-03-18 DIAGNOSIS — M24521 Contracture, right elbow: Secondary | ICD-10-CM | POA: Diagnosis not present

## 2020-03-18 DIAGNOSIS — G8250 Quadriplegia, unspecified: Secondary | ICD-10-CM | POA: Diagnosis not present

## 2020-03-18 NOTE — Telephone Encounter (Addendum)
Spoke with [place] and advised the last thing I show he was taking for his Trigeminal neuralgia  was Trileptal 300 mg one tablets bid. It does look like medication was discontinued at discharge when patient was in the hospital back in December, but no sure why.   He has also used Tramadol in the past as well for this.

## 2020-03-18 NOTE — Telephone Encounter (Signed)
Okay to refill trileptal and restart if it was helping... No clear reason it was discontinued per discharge notes.

## 2020-03-18 NOTE — Telephone Encounter (Signed)
[place] was going to notified the NP at the SNF of the medication.  I am assuming she will be the one ordering it.  They didn't ask [place] to prescribe/refill it.

## 2020-03-18 NOTE — Telephone Encounter (Signed)
The rep from [employer] called in about a medication that the patient states that we have given him in the past for his jaw and she has questions about it. Please call and advise. EM

## 2020-03-19 DIAGNOSIS — I69352 Hemiplegia and hemiparesis following cerebral infarction affecting left dominant side: Secondary | ICD-10-CM | POA: Diagnosis not present

## 2020-03-19 DIAGNOSIS — R293 Abnormal posture: Secondary | ICD-10-CM | POA: Diagnosis not present

## 2020-03-19 DIAGNOSIS — G35 Multiple sclerosis: Secondary | ICD-10-CM | POA: Diagnosis not present

## 2020-03-19 DIAGNOSIS — I69351 Hemiplegia and hemiparesis following cerebral infarction affecting right dominant side: Secondary | ICD-10-CM | POA: Diagnosis not present

## 2020-03-19 DIAGNOSIS — M24571 Contracture, right ankle: Secondary | ICD-10-CM | POA: Diagnosis not present

## 2020-03-19 DIAGNOSIS — G8250 Quadriplegia, unspecified: Secondary | ICD-10-CM | POA: Diagnosis not present

## 2020-03-19 DIAGNOSIS — M24521 Contracture, right elbow: Secondary | ICD-10-CM | POA: Diagnosis not present

## 2020-03-19 DIAGNOSIS — M24522 Contracture, left elbow: Secondary | ICD-10-CM | POA: Diagnosis not present

## 2020-03-19 DIAGNOSIS — R488 Other symbolic dysfunctions: Secondary | ICD-10-CM | POA: Diagnosis not present

## 2020-03-19 DIAGNOSIS — R1312 Dysphagia, oropharyngeal phase: Secondary | ICD-10-CM | POA: Diagnosis not present

## 2020-03-19 DIAGNOSIS — M24572 Contracture, left ankle: Secondary | ICD-10-CM | POA: Diagnosis not present

## 2020-03-20 DIAGNOSIS — M24522 Contracture, left elbow: Secondary | ICD-10-CM | POA: Diagnosis not present

## 2020-03-20 DIAGNOSIS — M24521 Contracture, right elbow: Secondary | ICD-10-CM | POA: Diagnosis not present

## 2020-03-20 DIAGNOSIS — M24571 Contracture, right ankle: Secondary | ICD-10-CM | POA: Diagnosis not present

## 2020-03-20 DIAGNOSIS — I69351 Hemiplegia and hemiparesis following cerebral infarction affecting right dominant side: Secondary | ICD-10-CM | POA: Diagnosis not present

## 2020-03-20 DIAGNOSIS — R293 Abnormal posture: Secondary | ICD-10-CM | POA: Diagnosis not present

## 2020-03-20 DIAGNOSIS — R488 Other symbolic dysfunctions: Secondary | ICD-10-CM | POA: Diagnosis not present

## 2020-03-20 DIAGNOSIS — G8250 Quadriplegia, unspecified: Secondary | ICD-10-CM | POA: Diagnosis not present

## 2020-03-20 DIAGNOSIS — M24572 Contracture, left ankle: Secondary | ICD-10-CM | POA: Diagnosis not present

## 2020-03-20 DIAGNOSIS — I69352 Hemiplegia and hemiparesis following cerebral infarction affecting left dominant side: Secondary | ICD-10-CM | POA: Diagnosis not present

## 2020-03-20 DIAGNOSIS — R1312 Dysphagia, oropharyngeal phase: Secondary | ICD-10-CM | POA: Diagnosis not present

## 2020-03-20 DIAGNOSIS — G35 Multiple sclerosis: Secondary | ICD-10-CM | POA: Diagnosis not present

## 2020-03-23 DIAGNOSIS — G35 Multiple sclerosis: Secondary | ICD-10-CM | POA: Diagnosis not present

## 2020-03-23 DIAGNOSIS — I69351 Hemiplegia and hemiparesis following cerebral infarction affecting right dominant side: Secondary | ICD-10-CM | POA: Diagnosis not present

## 2020-03-23 DIAGNOSIS — M24522 Contracture, left elbow: Secondary | ICD-10-CM | POA: Diagnosis not present

## 2020-03-23 DIAGNOSIS — M24571 Contracture, right ankle: Secondary | ICD-10-CM | POA: Diagnosis not present

## 2020-03-23 DIAGNOSIS — M24572 Contracture, left ankle: Secondary | ICD-10-CM | POA: Diagnosis not present

## 2020-03-23 DIAGNOSIS — I69352 Hemiplegia and hemiparesis following cerebral infarction affecting left dominant side: Secondary | ICD-10-CM | POA: Diagnosis not present

## 2020-03-23 DIAGNOSIS — R1312 Dysphagia, oropharyngeal phase: Secondary | ICD-10-CM | POA: Diagnosis not present

## 2020-03-23 DIAGNOSIS — G8250 Quadriplegia, unspecified: Secondary | ICD-10-CM | POA: Diagnosis not present

## 2020-03-23 DIAGNOSIS — M24521 Contracture, right elbow: Secondary | ICD-10-CM | POA: Diagnosis not present

## 2020-03-23 DIAGNOSIS — R488 Other symbolic dysfunctions: Secondary | ICD-10-CM | POA: Diagnosis not present

## 2020-03-23 DIAGNOSIS — R293 Abnormal posture: Secondary | ICD-10-CM | POA: Diagnosis not present

## 2020-03-24 DIAGNOSIS — M24571 Contracture, right ankle: Secondary | ICD-10-CM | POA: Diagnosis not present

## 2020-03-24 DIAGNOSIS — M24572 Contracture, left ankle: Secondary | ICD-10-CM | POA: Diagnosis not present

## 2020-03-24 DIAGNOSIS — R1312 Dysphagia, oropharyngeal phase: Secondary | ICD-10-CM | POA: Diagnosis not present

## 2020-03-24 DIAGNOSIS — I69351 Hemiplegia and hemiparesis following cerebral infarction affecting right dominant side: Secondary | ICD-10-CM | POA: Diagnosis not present

## 2020-03-24 DIAGNOSIS — G8250 Quadriplegia, unspecified: Secondary | ICD-10-CM | POA: Diagnosis not present

## 2020-03-24 DIAGNOSIS — R488 Other symbolic dysfunctions: Secondary | ICD-10-CM | POA: Diagnosis not present

## 2020-03-24 DIAGNOSIS — M24521 Contracture, right elbow: Secondary | ICD-10-CM | POA: Diagnosis not present

## 2020-03-24 DIAGNOSIS — M24522 Contracture, left elbow: Secondary | ICD-10-CM | POA: Diagnosis not present

## 2020-03-24 DIAGNOSIS — G35 Multiple sclerosis: Secondary | ICD-10-CM | POA: Diagnosis not present

## 2020-03-24 DIAGNOSIS — I69352 Hemiplegia and hemiparesis following cerebral infarction affecting left dominant side: Secondary | ICD-10-CM | POA: Diagnosis not present

## 2020-03-24 DIAGNOSIS — R293 Abnormal posture: Secondary | ICD-10-CM | POA: Diagnosis not present

## 2020-03-25 DIAGNOSIS — I69352 Hemiplegia and hemiparesis following cerebral infarction affecting left dominant side: Secondary | ICD-10-CM | POA: Diagnosis not present

## 2020-03-25 DIAGNOSIS — M24571 Contracture, right ankle: Secondary | ICD-10-CM | POA: Diagnosis not present

## 2020-03-25 DIAGNOSIS — M24572 Contracture, left ankle: Secondary | ICD-10-CM | POA: Diagnosis not present

## 2020-03-25 DIAGNOSIS — L304 Erythema intertrigo: Secondary | ICD-10-CM | POA: Diagnosis not present

## 2020-03-25 DIAGNOSIS — M6281 Muscle weakness (generalized): Secondary | ICD-10-CM | POA: Diagnosis not present

## 2020-03-25 DIAGNOSIS — R488 Other symbolic dysfunctions: Secondary | ICD-10-CM | POA: Diagnosis not present

## 2020-03-25 DIAGNOSIS — M24521 Contracture, right elbow: Secondary | ICD-10-CM | POA: Diagnosis not present

## 2020-03-25 DIAGNOSIS — R293 Abnormal posture: Secondary | ICD-10-CM | POA: Diagnosis not present

## 2020-03-25 DIAGNOSIS — G8250 Quadriplegia, unspecified: Secondary | ICD-10-CM | POA: Diagnosis not present

## 2020-03-25 DIAGNOSIS — I69351 Hemiplegia and hemiparesis following cerebral infarction affecting right dominant side: Secondary | ICD-10-CM | POA: Diagnosis not present

## 2020-03-25 DIAGNOSIS — G35 Multiple sclerosis: Secondary | ICD-10-CM | POA: Diagnosis not present

## 2020-03-25 DIAGNOSIS — R1312 Dysphagia, oropharyngeal phase: Secondary | ICD-10-CM | POA: Diagnosis not present

## 2020-03-25 DIAGNOSIS — M24522 Contracture, left elbow: Secondary | ICD-10-CM | POA: Diagnosis not present

## 2020-03-26 DIAGNOSIS — G35 Multiple sclerosis: Secondary | ICD-10-CM | POA: Diagnosis not present

## 2020-03-26 DIAGNOSIS — G8250 Quadriplegia, unspecified: Secondary | ICD-10-CM | POA: Diagnosis not present

## 2020-03-26 DIAGNOSIS — R488 Other symbolic dysfunctions: Secondary | ICD-10-CM | POA: Diagnosis not present

## 2020-03-26 DIAGNOSIS — R293 Abnormal posture: Secondary | ICD-10-CM | POA: Diagnosis not present

## 2020-03-26 DIAGNOSIS — M24521 Contracture, right elbow: Secondary | ICD-10-CM | POA: Diagnosis not present

## 2020-03-26 DIAGNOSIS — M24571 Contracture, right ankle: Secondary | ICD-10-CM | POA: Diagnosis not present

## 2020-03-26 DIAGNOSIS — I69352 Hemiplegia and hemiparesis following cerebral infarction affecting left dominant side: Secondary | ICD-10-CM | POA: Diagnosis not present

## 2020-03-26 DIAGNOSIS — M24522 Contracture, left elbow: Secondary | ICD-10-CM | POA: Diagnosis not present

## 2020-03-26 DIAGNOSIS — R1312 Dysphagia, oropharyngeal phase: Secondary | ICD-10-CM | POA: Diagnosis not present

## 2020-03-26 DIAGNOSIS — M24572 Contracture, left ankle: Secondary | ICD-10-CM | POA: Diagnosis not present

## 2020-03-26 DIAGNOSIS — I69351 Hemiplegia and hemiparesis following cerebral infarction affecting right dominant side: Secondary | ICD-10-CM | POA: Diagnosis not present

## 2020-03-27 ENCOUNTER — Inpatient Hospital Stay (HOSPITAL_COMMUNITY): Payer: Medicare Other

## 2020-03-27 ENCOUNTER — Emergency Department (HOSPITAL_COMMUNITY): Payer: Medicare Other

## 2020-03-27 ENCOUNTER — Inpatient Hospital Stay (HOSPITAL_COMMUNITY)
Admission: EM | Admit: 2020-03-27 | Discharge: 2020-04-01 | DRG: 698 | Disposition: A | Discharge status: Skilled Nursing Facility | Payer: Medicare Other | Source: Skilled Nursing Facility | Attending: Family Medicine | Admitting: Family Medicine

## 2020-03-27 ENCOUNTER — Other Ambulatory Visit: Payer: Self-pay

## 2020-03-27 DIAGNOSIS — Z7401 Bed confinement status: Secondary | ICD-10-CM

## 2020-03-27 DIAGNOSIS — J309 Allergic rhinitis, unspecified: Secondary | ICD-10-CM | POA: Diagnosis present

## 2020-03-27 DIAGNOSIS — N319 Neuromuscular dysfunction of bladder, unspecified: Secondary | ICD-10-CM | POA: Diagnosis not present

## 2020-03-27 DIAGNOSIS — G35 Multiple sclerosis: Secondary | ICD-10-CM | POA: Diagnosis present

## 2020-03-27 DIAGNOSIS — L89893 Pressure ulcer of other site, stage 3: Secondary | ICD-10-CM | POA: Diagnosis present

## 2020-03-27 DIAGNOSIS — R339 Retention of urine, unspecified: Secondary | ICD-10-CM | POA: Diagnosis not present

## 2020-03-27 DIAGNOSIS — I129 Hypertensive chronic kidney disease with stage 1 through stage 4 chronic kidney disease, or unspecified chronic kidney disease: Secondary | ICD-10-CM | POA: Diagnosis present

## 2020-03-27 DIAGNOSIS — J9811 Atelectasis: Secondary | ICD-10-CM | POA: Diagnosis present

## 2020-03-27 DIAGNOSIS — N182 Chronic kidney disease, stage 2 (mild): Secondary | ICD-10-CM | POA: Diagnosis not present

## 2020-03-27 DIAGNOSIS — E871 Hypo-osmolality and hyponatremia: Secondary | ICD-10-CM | POA: Diagnosis not present

## 2020-03-27 DIAGNOSIS — Z833 Family history of diabetes mellitus: Secondary | ICD-10-CM

## 2020-03-27 DIAGNOSIS — K219 Gastro-esophageal reflux disease without esophagitis: Secondary | ICD-10-CM | POA: Diagnosis not present

## 2020-03-27 DIAGNOSIS — R532 Functional quadriplegia: Secondary | ICD-10-CM | POA: Diagnosis present

## 2020-03-27 DIAGNOSIS — N179 Acute kidney failure, unspecified: Secondary | ICD-10-CM | POA: Diagnosis present

## 2020-03-27 DIAGNOSIS — Z87440 Personal history of urinary (tract) infections: Secondary | ICD-10-CM

## 2020-03-27 DIAGNOSIS — N200 Calculus of kidney: Secondary | ICD-10-CM | POA: Diagnosis present

## 2020-03-27 DIAGNOSIS — R5381 Other malaise: Secondary | ICD-10-CM | POA: Diagnosis not present

## 2020-03-27 DIAGNOSIS — E875 Hyperkalemia: Secondary | ICD-10-CM | POA: Diagnosis present

## 2020-03-27 DIAGNOSIS — Z79899 Other long term (current) drug therapy: Secondary | ICD-10-CM | POA: Diagnosis not present

## 2020-03-27 DIAGNOSIS — A408 Other streptococcal sepsis: Secondary | ICD-10-CM | POA: Diagnosis present

## 2020-03-27 DIAGNOSIS — B964 Proteus (mirabilis) (morganii) as the cause of diseases classified elsewhere: Secondary | ICD-10-CM | POA: Diagnosis present

## 2020-03-27 DIAGNOSIS — A419 Sepsis, unspecified organism: Secondary | ICD-10-CM | POA: Diagnosis present

## 2020-03-27 DIAGNOSIS — R6520 Severe sepsis without septic shock: Secondary | ICD-10-CM | POA: Diagnosis not present

## 2020-03-27 DIAGNOSIS — Z87891 Personal history of nicotine dependence: Secondary | ICD-10-CM

## 2020-03-27 DIAGNOSIS — Z888 Allergy status to other drugs, medicaments and biological substances status: Secondary | ICD-10-CM

## 2020-03-27 DIAGNOSIS — T83511A Infection and inflammatory reaction due to indwelling urethral catheter, initial encounter: Secondary | ICD-10-CM | POA: Diagnosis not present

## 2020-03-27 DIAGNOSIS — G500 Trigeminal neuralgia: Secondary | ICD-10-CM | POA: Diagnosis not present

## 2020-03-27 DIAGNOSIS — L89896 Pressure-induced deep tissue damage of other site: Secondary | ICD-10-CM | POA: Diagnosis present

## 2020-03-27 DIAGNOSIS — G4700 Insomnia, unspecified: Secondary | ICD-10-CM | POA: Diagnosis not present

## 2020-03-27 DIAGNOSIS — Y846 Urinary catheterization as the cause of abnormal reaction of the patient, or of later complication, without mention of misadventure at the time of the procedure: Secondary | ICD-10-CM | POA: Diagnosis present

## 2020-03-27 DIAGNOSIS — R6521 Severe sepsis with septic shock: Secondary | ICD-10-CM | POA: Diagnosis not present

## 2020-03-27 DIAGNOSIS — N12 Tubulo-interstitial nephritis, not specified as acute or chronic: Secondary | ICD-10-CM | POA: Diagnosis not present

## 2020-03-27 DIAGNOSIS — M2550 Pain in unspecified joint: Secondary | ICD-10-CM | POA: Diagnosis not present

## 2020-03-27 DIAGNOSIS — Z743 Need for continuous supervision: Secondary | ICD-10-CM | POA: Diagnosis not present

## 2020-03-27 DIAGNOSIS — N3001 Acute cystitis with hematuria: Secondary | ICD-10-CM | POA: Diagnosis not present

## 2020-03-27 DIAGNOSIS — Z8249 Family history of ischemic heart disease and other diseases of the circulatory system: Secondary | ICD-10-CM

## 2020-03-27 DIAGNOSIS — Z20822 Contact with and (suspected) exposure to covid-19: Secondary | ICD-10-CM | POA: Diagnosis not present

## 2020-03-27 DIAGNOSIS — A4189 Other specified sepsis: Secondary | ICD-10-CM | POA: Diagnosis not present

## 2020-03-27 DIAGNOSIS — G8250 Quadriplegia, unspecified: Secondary | ICD-10-CM | POA: Diagnosis not present

## 2020-03-27 DIAGNOSIS — B9689 Other specified bacterial agents as the cause of diseases classified elsewhere: Secondary | ICD-10-CM | POA: Diagnosis not present

## 2020-03-27 DIAGNOSIS — I10 Essential (primary) hypertension: Secondary | ICD-10-CM | POA: Diagnosis not present

## 2020-03-27 DIAGNOSIS — G8190 Hemiplegia, unspecified affecting unspecified side: Secondary | ICD-10-CM | POA: Diagnosis not present

## 2020-03-27 LAB — CBC
HCT: 39 % (ref 39.0–52.0)
Hemoglobin: 12.3 g/dL — ABNORMAL LOW (ref 13.0–17.0)
MCH: 28.5 pg (ref 26.0–34.0)
MCHC: 31.5 g/dL (ref 30.0–36.0)
MCV: 90.5 fL (ref 80.0–100.0)
Platelets: 303 10*3/uL (ref 150–400)
RBC: 4.31 MIL/uL (ref 4.22–5.81)
RDW: 15.9 % — ABNORMAL HIGH (ref 11.5–15.5)
WBC: 17 10*3/uL — ABNORMAL HIGH (ref 4.0–10.5)
nRBC: 0 % (ref 0.0–0.2)

## 2020-03-27 LAB — CBC WITH DIFFERENTIAL/PLATELET
Abs Immature Granulocytes: 0.11 10*3/uL — ABNORMAL HIGH (ref 0.00–0.07)
Basophils Absolute: 0.1 10*3/uL (ref 0.0–0.1)
Basophils Relative: 0 %
Eosinophils Absolute: 0 10*3/uL (ref 0.0–0.5)
Eosinophils Relative: 0 %
HCT: 45.7 % (ref 39.0–52.0)
Hemoglobin: 14.7 g/dL (ref 13.0–17.0)
Immature Granulocytes: 1 %
Lymphocytes Relative: 3 %
Lymphs Abs: 0.7 10*3/uL (ref 0.7–4.0)
MCH: 28.5 pg (ref 26.0–34.0)
MCHC: 32.2 g/dL (ref 30.0–36.0)
MCV: 88.6 fL (ref 80.0–100.0)
Monocytes Absolute: 1.3 10*3/uL — ABNORMAL HIGH (ref 0.1–1.0)
Monocytes Relative: 6 %
Neutro Abs: 19 10*3/uL — ABNORMAL HIGH (ref 1.7–7.7)
Neutrophils Relative %: 90 %
Platelets: 376 10*3/uL (ref 150–400)
RBC: 5.16 MIL/uL (ref 4.22–5.81)
RDW: 15.9 % — ABNORMAL HIGH (ref 11.5–15.5)
WBC: 21.2 10*3/uL — ABNORMAL HIGH (ref 4.0–10.5)
nRBC: 0 % (ref 0.0–0.2)

## 2020-03-27 LAB — URINALYSIS, ROUTINE W REFLEX MICROSCOPIC
Bilirubin Urine: NEGATIVE
Glucose, UA: NEGATIVE mg/dL
Ketones, ur: NEGATIVE mg/dL
Nitrite: POSITIVE — AB
Protein, ur: NEGATIVE mg/dL
Specific Gravity, Urine: 1.01 (ref 1.005–1.030)
WBC, UA: >50 WBC/hpf — ABNORMAL HIGH (ref 0–5)
pH: 8 (ref 5.0–8.0)

## 2020-03-27 LAB — CREATININE, SERUM
Creatinine, Ser: 1.43 mg/dL — ABNORMAL HIGH (ref 0.61–1.24)
GFR, Estimated: 56 mL/min — ABNORMAL LOW (ref >60)

## 2020-03-27 LAB — LACTIC ACID, PLASMA
Lactic Acid, Venous: 3.2 mmol/L (ref 0.5–1.9)
Lactic Acid, Venous: 3.6 mmol/L (ref 0.5–1.9)
Lactic Acid, Venous: 2.5 mmol/L (ref 0.5–1.9)

## 2020-03-27 LAB — PROCALCITONIN: Procalcitonin: 0.1 ng/mL

## 2020-03-27 LAB — BASIC METABOLIC PANEL
Anion gap: 12 (ref 5–15)
BUN: 35 mg/dL — ABNORMAL HIGH (ref 6–20)
CO2: 21 mmol/L — ABNORMAL LOW (ref 22–32)
Calcium: 9.1 mg/dL (ref 8.9–10.3)
Chloride: 101 mmol/L (ref 98–111)
Creatinine, Ser: 1.48 mg/dL — ABNORMAL HIGH (ref 0.61–1.24)
GFR, Estimated: 54 mL/min — ABNORMAL LOW (ref >60)
Glucose, Bld: 170 mg/dL — ABNORMAL HIGH (ref 70–99)
Potassium: 5.2 mmol/L — ABNORMAL HIGH (ref 3.5–5.1)
Sodium: 134 mmol/L — ABNORMAL LOW (ref 135–145)

## 2020-03-27 LAB — RESP PANEL BY RT-PCR (FLU A&B, COVID) ARPGX2
Influenza A by PCR: NEGATIVE
Influenza B by PCR: NEGATIVE
SARS Coronavirus 2 by RT PCR: NEGATIVE

## 2020-03-27 LAB — HEMOGLOBIN A1C
Hgb A1c MFr Bld: 5.1 % (ref 4.8–5.6)
Mean Plasma Glucose: 99.67 mg/dL

## 2020-03-27 LAB — GLUCOSE, CAPILLARY
Glucose-Capillary: 103 mg/dL — ABNORMAL HIGH (ref 70–99)
Glucose-Capillary: 98 mg/dL (ref 70–99)

## 2020-03-27 LAB — CORTISOL: Cortisol, Plasma: 6.2 ug/dL

## 2020-03-27 MED ORDER — INSULIN ASPART 100 UNIT/ML ~~LOC~~ SOLN
0.0000 [IU] | SUBCUTANEOUS | Status: DC
  Administered 2020-03-29: 3 [IU] via SUBCUTANEOUS
  Administered 2020-03-29 – 2020-03-30 (×3): 2 [IU] via SUBCUTANEOUS
  Administered 2020-03-31: 3 [IU] via SUBCUTANEOUS
  Administered 2020-04-01: 2 [IU] via SUBCUTANEOUS

## 2020-03-27 MED ORDER — ONDANSETRON HCL 4 MG/2ML IJ SOLN: 4.0000 mg | Freq: Four times a day (QID) | INTRAMUSCULAR | Status: DC | PRN

## 2020-03-27 MED ORDER — DOCUSATE SODIUM 100 MG PO CAPS
100.0000 mg | ORAL_CAPSULE | Freq: Two times a day (BID) | ORAL | Status: DC | PRN
  Administered 2020-03-30: 100 mg via ORAL
  Filled 2020-03-27: qty 1

## 2020-03-27 MED ORDER — LACTATED RINGERS IV SOLN: INTRAVENOUS | Status: AC

## 2020-03-27 MED ORDER — LACTATED RINGERS IV BOLUS (SEPSIS)
1300.0000 mL | Freq: Once | INTRAVENOUS | Status: AC
  Administered 2020-03-27: 1300 mL via INTRAVENOUS

## 2020-03-27 MED ORDER — SODIUM CHLORIDE 0.9 % IV SOLN
2.0000 g | Freq: Once | INTRAVENOUS | Status: AC
  Administered 2020-03-27: 2 g via INTRAVENOUS
  Filled 2020-03-27: qty 2

## 2020-03-27 MED ORDER — LACTATED RINGERS IV BOLUS
1000.0000 mL | Freq: Once | INTRAVENOUS | Status: AC
  Administered 2020-03-27: 1000 mL via INTRAVENOUS

## 2020-03-27 MED ORDER — POLYETHYLENE GLYCOL 3350 17 G PO PACK
17.0000 g | PACK | Freq: Every day | ORAL | Status: DC | PRN
  Administered 2020-03-30: 17 g via ORAL
  Filled 2020-03-27: qty 1

## 2020-03-27 MED ORDER — NOREPINEPHRINE 4 MG/250ML-% IV SOLN: 2.0000 ug/min | INTRAVENOUS | Status: DC

## 2020-03-27 MED ORDER — SODIUM CHLORIDE 0.9 % IV SOLN: 2.0000 g | Freq: Two times a day (BID) | INTRAVENOUS | Status: DC

## 2020-03-27 MED ORDER — CHLORHEXIDINE GLUCONATE CLOTH 2 % EX PADS
6.0000 | MEDICATED_PAD | Freq: Every day | CUTANEOUS | Status: DC
  Administered 2020-03-28 – 2020-04-01 (×5): 6 via TOPICAL

## 2020-03-27 MED ORDER — SODIUM CHLORIDE 0.9 % IV BOLUS
500.0000 mL | Freq: Once | INTRAVENOUS | Status: AC
  Administered 2020-03-27: 500 mL via INTRAVENOUS

## 2020-03-27 MED ORDER — SODIUM CHLORIDE 0.9 % IV SOLN
2.0000 g | Freq: Three times a day (TID) | INTRAVENOUS | Status: DC
  Administered 2020-03-27 – 2020-03-30 (×9): 2 g via INTRAVENOUS
  Filled 2020-03-27 (×15): qty 2

## 2020-03-27 MED ORDER — SODIUM CHLORIDE 0.9 % IV SOLN: 250.0000 mL | INTRAVENOUS | Status: DC

## 2020-03-27 MED ORDER — NOREPINEPHRINE 4 MG/250ML-% IV SOLN
0.0000 ug/min | INTRAVENOUS | Status: DC
  Administered 2020-03-27: 2 ug/min via INTRAVENOUS
  Filled 2020-03-27: qty 250

## 2020-03-27 MED ORDER — HEPARIN SODIUM (PORCINE) 5000 UNIT/ML IJ SOLN
5000.0000 [IU] | Freq: Three times a day (TID) | INTRAMUSCULAR | Status: DC
  Administered 2020-03-27 – 2020-04-01 (×14): 5000 [IU] via SUBCUTANEOUS
  Filled 2020-03-27 (×15): qty 1
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
## 2020-03-27 NOTE — Sepsis Progress Note (Signed)
Secure chat with MD requesting need for repeat LA (3rd) dt 2nd increasing from initial.  Order was immediatly placed by MD.
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
## 2020-03-27 NOTE — ED Notes (Signed)
Pt returned from US

## 2020-03-27 NOTE — Progress Notes (Signed)
Pharmacy Antibiotic Note  Travis Cantrell is a 60 y.o. male admitted on 03/27/2020 with sepsis, concern for UTI, hx of pseudomonas UTI.  Pharmacy has been consulted for cefepime dosing.  Plan: Cefepime 2g IV every 12 hours Monitor renal function, Cx and clinical progression to narrow  Height: 5\' 10"  (177.8 cm) Weight: 77 kg (169 lb 12.1 oz) IBW/kg (Calculated) : 73  Temp (24hrs), Avg:97.6 F (36.4 C), Min:97.6 F (36.4 C), Max:97.6 F (36.4 C)  Recent Labs  Lab 03/27/20 0925 03/27/20 1016  WBC 21.2*  --   CREATININE 1.48*  --   LATICACIDVEN  --  3.2*    Estimated Creatinine Clearance: 55.5 mL/min (A) (by C-G formula based on SCr of 1.48 mg/dL (H)).    Allergies  Allergen Reactions  . Baclofen Diarrhea    05/27/20, PharmD Clinical Pharmacist ED Pharmacist Phone # [phone] 03/27/2020 11:21 AM

## 2020-03-27 NOTE — Progress Notes (Signed)
Pharmacy Antibiotic Note  Travis Cantrell is a 60 y.o. male admitted on 03/27/2020 with sepsis, concern for UTI, hx of multi-organism (including pseudomonas) UTI. We initially planned to treat with cefepime and patient got a single dose at 1200 today. Pharmacy has now been consulted for meropenem dosing.  Plan: Discontinue Cefepime Start meropenem 2 g IV q8 hours  Monitor renal function, Cx and clinical progression to narrow  Height: 5\' 10"  (177.8 cm) Weight: 77 kg (169 lb 12.1 oz) IBW/kg (Calculated) : 73  Temp (24hrs), Avg:97.6 F (36.4 C), Min:97.6 F (36.4 C), Max:97.6 F (36.4 C)  Recent Labs  Lab 03/27/20 0925 03/27/20 1016 03/27/20 1244  WBC 21.2*  --   --   CREATININE 1.48*  --   --   LATICACIDVEN  --  3.2* 3.6*    Estimated Creatinine Clearance: 55.5 mL/min (A) (by C-G formula based on SCr of 1.48 mg/dL (H)).    Allergies  Allergen Reactions  . Baclofen Diarrhea   05/27/20 PharmD Candidate 2022 03/27/2020 2:17 PM

## 2020-03-27 NOTE — Sepsis Progress Note (Signed)
elink monitoring code sepsis.  

## 2020-03-27 NOTE — Consult Note (Signed)
WOC Nurse Consult Note: Reason for Consult: Wound type: medical device related pressure injury Pressure Injury POA: Yes Measurement:per Nursing and physician documentation, partial thickness skin loss and erythema at urethral opening related to medical device (indwelling urinary catheter) Wound RZN:BVAP, moist Drainage (amount, consistency, odor) scant serous Periwound:intact Dressing procedure/placement/frequency: I will provide guidance for moist wound healing and securement of the urinary catheter. Additionally, because of the high risk for skin breakdown due to insensate I will provide a mattress replacement and guidance to turn and float heels.  WOC nursing team will not follow, but will remain available to this patient, the nursing and medical teams.  Please re-consult if needed. Thanks, Ladona Mow, MSN, RN, GNP, [facility]  Pager# [phone]

## 2020-03-27 NOTE — ED Notes (Signed)
Notified Dr. Charm Barges of new onset of hypotension. 1000 ml fluid bolus being administered.

## 2020-03-27 NOTE — ED Triage Notes (Signed)
Pt BIB [facility] from [facility] with complaints of a burning sensation at his penis. Pt does have MS and has had an indwelling foley for a "long time". This RN and another RN visualized the foley and site of penis and noticed the penis is split at the top and is leaking around the opening. Dr. Charm Barges at bedside visualized same. VSS per EMS. Pt denies any other complaints.

## 2020-03-28 DIAGNOSIS — G35 Multiple sclerosis: Secondary | ICD-10-CM

## 2020-03-28 DIAGNOSIS — N319 Neuromuscular dysfunction of bladder, unspecified: Secondary | ICD-10-CM

## 2020-03-28 DIAGNOSIS — G8250 Quadriplegia, unspecified: Secondary | ICD-10-CM

## 2020-03-28 LAB — BLOOD CULTURE ID PANEL (REFLEXED) - BCID2

## 2020-03-28 LAB — BASIC METABOLIC PANEL
Anion gap: 8 (ref 5–15)
BUN: 25 mg/dL — ABNORMAL HIGH (ref 6–20)
CO2: 26 mmol/L (ref 22–32)
Calcium: 8.8 mg/dL — ABNORMAL LOW (ref 8.9–10.3)
Chloride: 104 mmol/L (ref 98–111)
Creatinine, Ser: 1.15 mg/dL (ref 0.61–1.24)
GFR, Estimated: >60 mL/min (ref >60)
Glucose, Bld: 102 mg/dL — ABNORMAL HIGH (ref 70–99)
Potassium: 4.4 mmol/L (ref 3.5–5.1)
Sodium: 138 mmol/L (ref 135–145)

## 2020-03-28 LAB — CBC
HCT: 41.7 % (ref 39.0–52.0)
Hemoglobin: 13.1 g/dL (ref 13.0–17.0)
MCH: 27.8 pg (ref 26.0–34.0)
MCHC: 31.4 g/dL (ref 30.0–36.0)
MCV: 88.5 fL (ref 80.0–100.0)
Platelets: 294 10*3/uL (ref 150–400)
RBC: 4.71 MIL/uL (ref 4.22–5.81)
RDW: 15.9 % — ABNORMAL HIGH (ref 11.5–15.5)
WBC: 12.4 10*3/uL — ABNORMAL HIGH (ref 4.0–10.5)
nRBC: 0 % (ref 0.0–0.2)

## 2020-03-28 LAB — GLUCOSE, CAPILLARY
Glucose-Capillary: 113 mg/dL — ABNORMAL HIGH (ref 70–99)
Glucose-Capillary: 81 mg/dL (ref 70–99)
Glucose-Capillary: 89 mg/dL (ref 70–99)
Glucose-Capillary: 85 mg/dL (ref 70–99)

## 2020-03-28 LAB — MAGNESIUM: Magnesium: 2 mg/dL (ref 1.7–2.4)

## 2020-03-28 LAB — MRSA PCR SCREENING: MRSA by PCR: NEGATIVE

## 2020-03-28 NOTE — Plan of Care (Signed)

## 2020-03-28 NOTE — Progress Notes (Signed)
PROGRESS NOTE    Travis Cantrell  LGX:[id number] DOB: 1960-08-27 DOA: 03/27/2020 PCP: Excell Seltzer, MD    No chief complaint on file.   Brief Narrative:  H/ o MS with Spastic quadriparesis /quadriplegia, bedbound, neurogenic bladder with indwelling Foley catheter, decubitus ulcer sent to the ED from skilled nursing facility due to complaining of burning sensation at his penis, he developed septic shock required pressor briefly a initially ICU admission requested, he improved very quick has been weaned off pressors, care transferred to hospitalist the next day  Subjective:   He is aaox3, denies pain, no fever, no sob, He has been on clears, he wants regular diet, reports eats regular diet at SNF  Assessment & Plan:   Active Problems:   Sepsis (HCC)   Septic shock (HCC)  Septic shock, infection source including UTI and possible bacteremia, off pressor, continue meropenem which is started on admission, follow-up on culture result  Complicated UTI  indwelling Foley catheter, switched on admission Has known large right renal staghorn stone, per chart review previously evaluated by urology who recommend outpatient follow-up to discuss a right percutaneous nephrolithotomy, per urology if patient developed hydronephrosis will need PCN tube. Renal ultrasound this hospitalization showed stable appearance large right renal staghorn stone, negative for hydronephrosis  AKI on CKDII BUN 35 creatinine 1.48 on presentation, improving, renal function now back to baseline Renal dosing meds   MS with Spastic quadriparesis /quadriplegia, bedbound, neurogenic bladder with indwelling Foley catheter, decubitus ulcer I have reviewed outpatient neurology notes, it appeared that her MS treatment is challenging due to recurrent infections  Nutritional Assessment: The patient's BMI is: Body mass index is 23.88 kg/m.Marland Kitchen      Unresulted Labs (From admission, onward)          Start     Ordered    03/29/20 0500  CBC  Tomorrow morning,   R        03/28/20 2226   03/29/20 0500  Basic metabolic panel  Daily,   R      03/28/20 2226            DVT prophylaxis: heparin injection 5,000 Units Start: 03/27/20 1445 SCDs Start: 03/27/20 1429   Code Status: Family Communication: he declined my offer to call his family Disposition:   Status is: Inpatient  Dispo: The patient is from: SNF              Anticipated d/c is to: SNF              Anticipated d/c date is: TBD, awaiting for final urine culture and blood culture                Consultants:   Critical care  Procedures:   none  Antimicrobials:    Anti-infectives (From admission, onward)   Start     Dose/Rate Route Frequency Ordered Stop   03/27/20 2200  ceFEPIme (MAXIPIME) 2 g in sodium chloride 0.9 % 100 mL IVPB  Status:  Discontinued        2 g 200 mL/hr over 30 Minutes Intravenous Every 12 hours 03/27/20 1122 03/27/20 1430   03/27/20 1800  meropenem (MERREM) 2 g in sodium chloride 0.9 % 100 mL IVPB        2 g 200 mL/hr over 30 Minutes Intravenous Every 8 hours 03/27/20 1430     03/27/20 1130  ceFEPIme (MAXIPIME) 2 g in sodium chloride 0.9 % 100 mL IVPB  2 g 200 mL/hr over 30 Minutes Intravenous  Once 03/27/20 1116 03/27/20 1200         Objective: Vitals:   03/28/20 0500 03/28/20 0749 03/28/20 1143 03/28/20 1959  BP:  102/70 128/78 120/76  Pulse:  90 88 83  Resp:  17 18 18   Temp:  98.8 F (37.1 C) 98.8 F (37.1 C) 98.7 F (37.1 C)  TempSrc:  Oral Oral Oral  SpO2:    98%  Weight: 75.5 kg     Height:        Intake/Output Summary (Last 24 hours) at 03/28/2020 2227 Last data filed at 03/28/2020 1606 Gross per 24 hour  Intake 3800 ml  Output 3200 ml  Net 600 ml   Filed Weights   03/27/20 0912 03/28/20 0500  Weight: 77 kg 75.5 kg    Examination:  General exam: chronically ill appearing , calm, NAD Respiratory system: diminished at bases, no wheezing, no rales, no rhonchi. Respiratory  effort normal. Cardiovascular system: S1 & S2 heard, RRR.  No pedal edema. Gastrointestinal system: Abdomen is nondistended, soft and nontender.  Normal bowel sounds heard. Central nervous system: Alert and oriented. Chronic upper extremity contractures, lower extremity sensation intact, but not able to move Extremities: paraplegia  Skin: + pressure ulcers and erythema skin changes in legs  Psychiatry: Judgement and insight appear normal. Mood & affect appropriate.     Data Reviewed: I have personally reviewed following labs and imaging studies  CBC: Recent Labs  Lab 03/27/20 0925 03/27/20 1532 03/28/20 0144  WBC 21.2* 17.0* 12.4*  NEUTROABS 19.0*  --   --   HGB 14.7 12.3* 13.1  HCT 45.7 39.0 41.7  MCV 88.6 90.5 88.5  PLT 376 303 294    Basic Metabolic Panel: Recent Labs  Lab 03/27/20 0925 03/27/20 1532 03/28/20 0144  NA 134*  --  138  K 5.2*  --  4.4  CL 101  --  104  CO2 21*  --  26  GLUCOSE 170*  --  102*  BUN 35*  --  25*  CREATININE 1.48* 1.43* 1.15  CALCIUM 9.1  --  8.8*  MG  --   --  2.0    GFR: Estimated Creatinine Clearance: 71.4 mL/min (by C-G formula based on SCr of 1.15 mg/dL).  Liver Function Tests: No results for input(s): AST, ALT, ALKPHOS, BILITOT, PROT, ALBUMIN in the last 168 hours.  CBG: Recent Labs  Lab 03/27/20 2354 03/28/20 0353 03/28/20 0747 03/28/20 1142 03/28/20 1956  GLUCAP 98 113* 81 89 85     Recent Results (from the past 240 hour(s))  Urine culture     Status: Abnormal (Preliminary result)   Collection Time: 03/27/20  9:19 AM   Specimen: Urine, Random  Result Value Ref Range Status   Specimen Description URINE, RANDOM  Final   Special Requests NONE  Final   Culture (A)  Final    >=100,000 COLONIES/mL PROTEUS MIRABILIS SUSCEPTIBILITIES TO FOLLOW Performed at [facility], [address] [place], [place] [address]    Report Status PENDING  Incomplete  Culture, blood (routine x 2)     Status: None (Preliminary  result)   Collection Time: 03/27/20 10:16 AM   Specimen: BLOOD RIGHT WRIST  Result Value Ref Range Status   Specimen Description BLOOD RIGHT WRIST  Final   Special Requests   Final    BOTTLES DRAWN AEROBIC AND ANAEROBIC Blood Culture adequate volume   Culture   Final  NO GROWTH < 24 HOURS Performed at [facility], [address], [place], [place] [address]    Report Status PENDING  Incomplete  Culture, blood (routine x 2)     Status: None (Preliminary result)   Collection Time: 03/27/20 10:28 AM   Specimen: BLOOD RIGHT FOREARM  Result Value Ref Range Status   Specimen Description BLOOD RIGHT FOREARM  Final   Special Requests   Final    BOTTLES DRAWN AEROBIC AND ANAEROBIC Blood Culture adequate volume   Culture  Setup Time   Final    GRAM POSITIVE COCCI IN CLUSTERS ANAEROBIC BOTTLE ONLY CRITICAL RESULT CALLED TO, READ BACK BY AND VERIFIED WITH: Jeanella Cara PHARMD @0921  03/28/20 EB Performed at [facility], [address], [place], [place]    Culture GRAM POSITIVE COCCI  Final   Report Status PENDING  Incomplete  Blood Culture ID Panel (Reflexed)     Status: Abnormal   Collection Time: 03/27/20 10:28 AM  Result Value Ref Range Status   Enterococcus faecalis NOT DETECTED NOT DETECTED Final   Enterococcus Faecium NOT DETECTED NOT DETECTED Final   Listeria monocytogenes NOT DETECTED NOT DETECTED Final   Staphylococcus species DETECTED (A) NOT DETECTED Final    Comment: CRITICAL RESULT CALLED TO, READ BACK BY AND VERIFIED WITH: CATHY PIERCE PHARMD @0921  03/28/20 EB    Staphylococcus aureus (BCID) NOT DETECTED NOT DETECTED Final   Staphylococcus epidermidis NOT DETECTED NOT DETECTED Final   Staphylococcus lugdunensis NOT DETECTED NOT DETECTED Final   Streptococcus species NOT DETECTED NOT DETECTED Final   Streptococcus agalactiae NOT DETECTED NOT DETECTED Final   Streptococcus pneumoniae NOT DETECTED NOT DETECTED Final   Streptococcus pyogenes NOT DETECTED  NOT DETECTED Final   A.calcoaceticus-baumannii NOT DETECTED NOT DETECTED Final   Bacteroides fragilis NOT DETECTED NOT DETECTED Final   Enterobacterales NOT DETECTED NOT DETECTED Final   Enterobacter cloacae complex NOT DETECTED NOT DETECTED Final   Escherichia coli NOT DETECTED NOT DETECTED Final   Klebsiella aerogenes NOT DETECTED NOT DETECTED Final   Klebsiella oxytoca NOT DETECTED NOT DETECTED Final   Klebsiella pneumoniae NOT DETECTED NOT DETECTED Final   Proteus species NOT DETECTED NOT DETECTED Final   Salmonella species NOT DETECTED NOT DETECTED Final   Serratia marcescens NOT DETECTED NOT DETECTED Final   Haemophilus influenzae NOT DETECTED NOT DETECTED Final   Neisseria meningitidis NOT DETECTED NOT DETECTED Final   Pseudomonas aeruginosa NOT DETECTED NOT DETECTED Final   Stenotrophomonas maltophilia NOT DETECTED NOT DETECTED Final   Candida albicans NOT DETECTED NOT DETECTED Final   Candida auris NOT DETECTED NOT DETECTED Final   Candida glabrata NOT DETECTED NOT DETECTED Final   Candida krusei NOT DETECTED NOT DETECTED Final   Candida parapsilosis NOT DETECTED NOT DETECTED Final   Candida tropicalis NOT DETECTED NOT DETECTED Final   Cryptococcus neoformans/gattii NOT DETECTED NOT DETECTED Final    Comment: Performed at [facility], [address], [place], [address] [place]  Resp Panel by RT-PCR (Flu A&B, Covid) Nasopharyngeal Swab     Status: None   Collection Time: 03/27/20 12:35 PM   Specimen: Nasopharyngeal Swab; Nasopharyngeal(NP) swabs in vial transport medium  Result Value Ref Range Status   SARS Coronavirus 2 by RT PCR NEGATIVE NEGATIVE Final    Comment: (NOTE) SARS-CoV-2 target nucleic acids are NOT DETECTED.  The SARS-CoV-2 RNA is generally detectable in upper respiratory specimens during the acute phase of infection. The lowest concentration of SARS-CoV-2 viral copies  this assay can detect is 138 copies/mL. A negative result does not preclude  SARS-Cov-2 infection and should not be used as the sole basis for treatment or other patient management decisions. A negative result may occur with  improper specimen collection/handling, submission of specimen other than nasopharyngeal swab, presence of viral mutation(s) within the areas targeted by this assay, and inadequate number of viral copies(<138 copies/mL). A negative result must be combined with clinical observations, patient history, and epidemiological information. The expected result is Negative.  Fact Sheet for Patients:  BloggerCourse.com  Fact Sheet for Healthcare Providers:  SeriousBroker.it  This test is no t yet approved or cleared by the [place] FDA and  has been authorized for detection and/or diagnosis of SARS-CoV-2 by FDA under an Emergency Use Authorization (EUA). This EUA will remain  in effect (meaning this test can be used) for the duration of the COVID-19 declaration under Section 564(b)(1) of the Act, 21 U.S.C.section 360bbb-3(b)(1), unless the authorization is terminated  or revoked sooner.       Influenza A by PCR NEGATIVE NEGATIVE Final   Influenza B by PCR NEGATIVE NEGATIVE Final    Comment: (NOTE) The Xpert Xpress SARS-CoV-2/FLU/RSV plus assay is intended as an aid in the diagnosis of influenza from Nasopharyngeal swab specimens and should not be used as a sole basis for treatment. Nasal washings and aspirates are unacceptable for Xpert Xpress SARS-CoV-2/FLU/RSV testing.  Fact Sheet for Patients: BloggerCourse.com  Fact Sheet for Healthcare Providers: SeriousBroker.it  This test is not yet approved or cleared by the [place] FDA and has been authorized for detection and/or diagnosis of SARS-CoV-2 by FDA under an Emergency Use Authorization (EUA). This EUA will remain in effect (meaning this test can be used) for the duration of  the COVID-19 declaration under Section 564(b)(1) of the Act, 21 U.S.C. section 360bbb-3(b)(1), unless the authorization is terminated or revoked.  Performed at [facility], [address] [place], [place] [address]   MRSA PCR Screening     Status: None   Collection Time: 03/28/20  3:05 AM   Specimen: Nasopharyngeal  Result Value Ref Range Status   MRSA by PCR NEGATIVE NEGATIVE Final    Comment:        The GeneXpert MRSA Assay (FDA approved for NASAL specimens only), is one component of a comprehensive MRSA colonization surveillance program. It is not intended to diagnose MRSA infection nor to guide or monitor treatment for MRSA infections. Performed at [facility], [address] [place], [place] [address]          Radiology Studies: US RENAL  Result Date: 03/27/2020 CLINICAL DATA:  Right staghorn renal calculus. EXAM: RENAL / URINARY TRACT ULTRASOUND COMPLETE COMPARISON:  CT 01/20/2020.  Ultrasound 01/12/2020 FINDINGS: Right Kidney: Renal measurements: 11.4 x 6.2 x 5.4 cm = volume: 202 mL. Normal echogenicity of the right renal cortex. Again noted is echogenic material with posterior acoustic shadowing throughout the central aspect of the right kidney. Findings are compatible with a large staghorn calculus. Negative for right hydronephrosis. Left Kidney: Renal measurements: 11.6 x 5.6 x 5.3 cm = volume: 182 mL. Normal echogenicity. No suspicious renal lesion. No large echogenic foci in the left kidney but limited evaluation for small kidney stones. No hydronephrosis. Bladder: Not visualized. Other: None. IMPRESSION: 1. Stable appearance of the right kidney with echogenic shadowing calcifications throughout the right renal collecting system. 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 2. Negative for hydronephrosis. Electronically  Signed   By: Richarda Overlie M.D.   On: 03/27/2020 15:17   DG Chest Port 1 View  Result Date: 03/27/2020 CLINICAL  DATA:  Sepsis, UTI.  Hypotensive. EXAM: PORTABLE CHEST 1 VIEW COMPARISON:  01/12/2020. FINDINGS: Trachea is midline. Heart size stable. Streaky atelectasis in the medial left lower lobe. Lungs are otherwise clear. No pleural fluid. IMPRESSION: No acute findings. Electronically Signed   By: Leanna Battles M.D.   On: 03/27/2020 13:47        Scheduled Meds: . Chlorhexidine Gluconate Cloth  6 each Topical Daily  . heparin  5,000 Units Subcutaneous Q8H  . insulin aspart  0-15 Units Subcutaneous Q4H   Continuous Infusions: . sodium chloride    . sodium chloride    . meropenem (MERREM) IV 2 g (03/28/20 2226)     LOS: 1 day   Time spent: 35 mins Greater than 50% of this time was spent in counseling, explanation of diagnosis, planning of further management, and coordination of care.   Voice Recognition Reubin Milan dictation system was used to create this note, attempts have been made to correct errors. Please contact the author with questions and/or clarifications.   Albertine Grates, MD PhD FACP [facility]  Available via Epic secure chat 7am-7pm for nonurgent issues Please page for urgent issues To page the attending provider between 7A-7P or the covering provider during after hours 7P-7A, please log into the web site [email or web address] and access using universal [place] password for that web site. If you do not have the password, please call the hospital operator.    03/28/2020, 10:27 PM

## 2020-03-28 NOTE — Progress Notes (Signed)
PHARMACY - PHYSICIAN COMMUNICATION CRITICAL VALUE ALERT - BLOOD CULTURE IDENTIFICATION (BCID)  Travis Cantrell is an 60 y.o. male who presented to [facility] on 03/27/2020 with a chief complaint of burning pain around penis. Patient has a chronic foley catheter and is chronically bedbound. Patient has a history of multi-organism UTIs.   Assessment:  1o4 bottles (anerobic bottle) growing gram positive cocci in clusters. BCID showing staph species with no resistance noted at this time. Potential urinary source with chronic catheter.   Name of physician (or Provider) Contacted: Dr. Roda Shutters   Current antibiotics: Meropenem 2G IV Q8H   Changes to prescribed antibiotics recommended:  Continue meropnem due to history of multi organism infections.  Spoke with physician who will evaluate patient and adjust therapy as clinically indicated.   Results for orders placed or performed during the hospital encounter of 03/27/20  Blood Culture ID Panel (Reflexed) (Collected: 03/27/2020 10:28 AM)  Result Value Ref Range   Enterococcus faecalis NOT DETECTED NOT DETECTED   Enterococcus Faecium NOT DETECTED NOT DETECTED   Listeria monocytogenes NOT DETECTED NOT DETECTED   Staphylococcus species DETECTED (A) NOT DETECTED   Staphylococcus aureus (BCID) NOT DETECTED NOT DETECTED   Staphylococcus epidermidis NOT DETECTED NOT DETECTED   Staphylococcus lugdunensis NOT DETECTED NOT DETECTED   Streptococcus species NOT DETECTED NOT DETECTED   Streptococcus agalactiae NOT DETECTED NOT DETECTED   Streptococcus pneumoniae NOT DETECTED NOT DETECTED   Streptococcus pyogenes NOT DETECTED NOT DETECTED   A.calcoaceticus-baumannii NOT DETECTED NOT DETECTED   Bacteroides fragilis NOT DETECTED NOT DETECTED   Enterobacterales NOT DETECTED NOT DETECTED   Enterobacter cloacae complex NOT DETECTED NOT DETECTED   Escherichia coli NOT DETECTED NOT DETECTED   Klebsiella aerogenes NOT DETECTED NOT DETECTED   Klebsiella oxytoca NOT DETECTED  NOT DETECTED   Klebsiella pneumoniae NOT DETECTED NOT DETECTED   Proteus species NOT DETECTED NOT DETECTED   Salmonella species NOT DETECTED NOT DETECTED   Serratia marcescens NOT DETECTED NOT DETECTED   Haemophilus influenzae NOT DETECTED NOT DETECTED   Neisseria meningitidis NOT DETECTED NOT DETECTED   Pseudomonas aeruginosa NOT DETECTED NOT DETECTED   Stenotrophomonas maltophilia NOT DETECTED NOT DETECTED   Candida albicans NOT DETECTED NOT DETECTED   Candida auris NOT DETECTED NOT DETECTED   Candida glabrata NOT DETECTED NOT DETECTED   Candida krusei NOT DETECTED NOT DETECTED   Candida parapsilosis NOT DETECTED NOT DETECTED   Candida tropicalis NOT DETECTED NOT DETECTED   Cryptococcus neoformans/gattii NOT DETECTED NOT DETECTED    Isaias Sakai, PharmD, MBA Pharmacy Resident [phone] 03/28/2020 9:39 AM

## 2020-03-28 NOTE — Plan of Care (Signed)
  Problem: Education: Goal: Knowledge of General Education information will improve Description: Including pain rating scale, medication(s)/side effects and non-pharmacologic comfort measures 03/28/2020 0128 by Gillian Scarce, RN Outcome: Progressing 03/28/2020 0127 by Gillian Scarce, RN Outcome: Progressing 03/28/2020 0127 by Gillian Scarce, RN Outcome: Progressing   Problem: Health Behavior/Discharge Planning: Goal: Ability to manage health-related needs will improve 03/28/2020 0128 by Gillian Scarce, RN Outcome: Progressing 03/28/2020 0127 by Gillian Scarce, RN Outcome: Progressing 03/28/2020 0127 by Gillian Scarce, RN Outcome: Progressing

## 2020-03-29 LAB — CBC
HCT: 38.9 % — ABNORMAL LOW (ref 39.0–52.0)
Hemoglobin: 12.5 g/dL — ABNORMAL LOW (ref 13.0–17.0)
MCH: 28.1 pg (ref 26.0–34.0)
MCHC: 32.1 g/dL (ref 30.0–36.0)
MCV: 87.4 fL (ref 80.0–100.0)
Platelets: 277 10*3/uL (ref 150–400)
RBC: 4.45 MIL/uL (ref 4.22–5.81)
RDW: 15.5 % (ref 11.5–15.5)
WBC: 10.1 10*3/uL (ref 4.0–10.5)
nRBC: 0 % (ref 0.0–0.2)

## 2020-03-29 LAB — BASIC METABOLIC PANEL
Anion gap: 9 (ref 5–15)
BUN: 18 mg/dL (ref 6–20)
CO2: 25 mmol/L (ref 22–32)
Calcium: 8.7 mg/dL — ABNORMAL LOW (ref 8.9–10.3)
Chloride: 102 mmol/L (ref 98–111)
Creatinine, Ser: 1.1 mg/dL (ref 0.61–1.24)
GFR, Estimated: >60 mL/min (ref >60)
Glucose, Bld: 84 mg/dL (ref 70–99)
Potassium: 3.7 mmol/L (ref 3.5–5.1)
Sodium: 136 mmol/L (ref 135–145)

## 2020-03-29 LAB — URINE CULTURE: Culture: >=100000 — AB

## 2020-03-29 LAB — GLUCOSE, CAPILLARY
Glucose-Capillary: 113 mg/dL — ABNORMAL HIGH (ref 70–99)
Glucose-Capillary: 125 mg/dL — ABNORMAL HIGH (ref 70–99)
Glucose-Capillary: 169 mg/dL — ABNORMAL HIGH (ref 70–99)
Glucose-Capillary: 83 mg/dL (ref 70–99)
Glucose-Capillary: 83 mg/dL (ref 70–99)
Glucose-Capillary: 87 mg/dL (ref 70–99)
Glucose-Capillary: 89 mg/dL (ref 70–99)

## 2020-03-29 MED ORDER — ACETAMINOPHEN 325 MG PO TABS
650.0000 mg | ORAL_TABLET | Freq: Four times a day (QID) | ORAL | Status: DC | PRN
  Administered 2020-03-29 – 2020-03-31 (×2): 650 mg via ORAL
  Filled 2020-03-29 (×3): qty 2

## 2020-03-29 MED ORDER — HYDROCERIN EX CREA
TOPICAL_CREAM | Freq: Every day | CUTANEOUS | Status: DC
  Filled 2020-03-29: qty 113

## 2020-03-29 NOTE — Plan of Care (Signed)
  Problem: Coping: Goal: Level of anxiety will decrease Outcome: Progressing   Problem: Pain Managment: Goal: General experience of comfort will improve Outcome: Progressing   Problem: Safety: Goal: Ability to remain free from injury will improve Outcome: Progressing   Problem: Skin Integrity: Goal: Risk for impaired skin integrity will decrease Outcome: Progressing   
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
## 2020-03-30 LAB — BASIC METABOLIC PANEL
Anion gap: 10 (ref 5–15)
BUN: 15 mg/dL (ref 6–20)
CO2: 24 mmol/L (ref 22–32)
Calcium: 8.8 mg/dL — ABNORMAL LOW (ref 8.9–10.3)
Chloride: 102 mmol/L (ref 98–111)
Creatinine, Ser: 1.19 mg/dL (ref 0.61–1.24)
GFR, Estimated: >60 mL/min (ref >60)
Glucose, Bld: 94 mg/dL (ref 70–99)
Potassium: 3.7 mmol/L (ref 3.5–5.1)
Sodium: 136 mmol/L (ref 135–145)

## 2020-03-30 LAB — CULTURE, BLOOD (ROUTINE X 2): Special Requests: ADEQUATE

## 2020-03-30 LAB — GLUCOSE, CAPILLARY
Glucose-Capillary: 86 mg/dL (ref 70–99)
Glucose-Capillary: 89 mg/dL (ref 70–99)
Glucose-Capillary: 97 mg/dL (ref 70–99)
Glucose-Capillary: 147 mg/dL — ABNORMAL HIGH (ref 70–99)

## 2020-03-30 MED ORDER — CEPHALEXIN 500 MG PO CAPS
500.0000 mg | ORAL_CAPSULE | Freq: Two times a day (BID) | ORAL | Status: DC
  Administered 2020-03-30 – 2020-03-31 (×2): 500 mg via ORAL
  Filled 2020-03-30 (×2): qty 1

## 2020-03-30 NOTE — Plan of Care (Signed)

## 2020-03-30 NOTE — Progress Notes (Signed)
CSW spoke with Loie at [facility].  Pt is in long term care there and is eligible to return when ready. Daleen Squibb, MSW, LCSW 3/7/20222:15 PM
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
## 2020-03-30 NOTE — Plan of Care (Signed)

## 2020-03-30 NOTE — Care Management Important Message (Signed)
Important Message  Patient Details  Name: DANEIL BEEM MRN: [id number] Date of Birth: April 28, 1960   Medicare Important Message Given:  Yes  Due to illness patient was not able to sign.  Signed copy left at patient bedside.     Iris Bratton 03/30/2020, 3:10 PM

## 2020-03-31 ENCOUNTER — Other Ambulatory Visit: Payer: Self-pay

## 2020-03-31 DIAGNOSIS — R6521 Severe sepsis with septic shock: Secondary | ICD-10-CM | POA: Diagnosis not present

## 2020-03-31 DIAGNOSIS — N200 Calculus of kidney: Secondary | ICD-10-CM

## 2020-03-31 DIAGNOSIS — B9689 Other specified bacterial agents as the cause of diseases classified elsewhere: Secondary | ICD-10-CM

## 2020-03-31 DIAGNOSIS — A4189 Other specified sepsis: Secondary | ICD-10-CM | POA: Diagnosis not present

## 2020-03-31 DIAGNOSIS — N3001 Acute cystitis with hematuria: Secondary | ICD-10-CM

## 2020-03-31 LAB — CBC
HCT: 45.3 % (ref 39.0–52.0)
Hemoglobin: 14.8 g/dL (ref 13.0–17.0)
MCH: 28.4 pg (ref 26.0–34.0)
MCHC: 32.7 g/dL (ref 30.0–36.0)
MCV: 86.9 fL (ref 80.0–100.0)
Platelets: 303 10*3/uL (ref 150–400)
RBC: 5.21 MIL/uL (ref 4.22–5.81)
RDW: 15.2 % (ref 11.5–15.5)
WBC: 10 10*3/uL (ref 4.0–10.5)
nRBC: 0 % (ref 0.0–0.2)

## 2020-03-31 LAB — GLUCOSE, CAPILLARY
Glucose-Capillary: 103 mg/dL — ABNORMAL HIGH (ref 70–99)
Glucose-Capillary: 106 mg/dL — ABNORMAL HIGH (ref 70–99)
Glucose-Capillary: 109 mg/dL — ABNORMAL HIGH (ref 70–99)
Glucose-Capillary: 112 mg/dL — ABNORMAL HIGH (ref 70–99)
Glucose-Capillary: 132 mg/dL — ABNORMAL HIGH (ref 70–99)
Glucose-Capillary: 188 mg/dL — ABNORMAL HIGH (ref 70–99)
Glucose-Capillary: 88 mg/dL (ref 70–99)
Glucose-Capillary: 93 mg/dL (ref 70–99)
Glucose-Capillary: 94 mg/dL (ref 70–99)

## 2020-03-31 LAB — BASIC METABOLIC PANEL
Anion gap: 10 (ref 5–15)
BUN: 14 mg/dL (ref 6–20)
CO2: 23 mmol/L (ref 22–32)
Calcium: 9.2 mg/dL (ref 8.9–10.3)
Chloride: 103 mmol/L (ref 98–111)
Creatinine, Ser: 1.01 mg/dL (ref 0.61–1.24)
GFR, Estimated: >60 mL/min (ref >60)
Glucose, Bld: 92 mg/dL (ref 70–99)
Potassium: 4.1 mmol/L (ref 3.5–5.1)
Sodium: 136 mmol/L (ref 135–145)

## 2020-03-31 LAB — MAGNESIUM: Magnesium: 2.1 mg/dL (ref 1.7–2.4)

## 2020-03-31 LAB — PHOSPHORUS: Phosphorus: 1.7 mg/dL — ABNORMAL LOW (ref 2.5–4.6)

## 2020-03-31 MED ORDER — CEPHALEXIN 500 MG PO CAPS
500.0000 mg | ORAL_CAPSULE | Freq: Four times a day (QID) | ORAL | Status: DC
  Administered 2020-03-31 – 2020-04-01 (×5): 500 mg via ORAL
  Filled 2020-03-31 (×5): qty 1

## 2020-03-31 MED ORDER — K PHOS MONO-SOD PHOS DI & MONO 155-852-130 MG PO TABS
250.0000 mg | ORAL_TABLET | Freq: Three times a day (TID) | ORAL | Status: DC
  Administered 2020-03-31 – 2020-04-01 (×4): 250 mg via ORAL
  Filled 2020-03-31 (×4): qty 1
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
## 2020-03-31 NOTE — Consult Note (Signed)
WOC Nurse Consult Note: Patient receiving care in [facility] 2W16. Reason for Consult: buttock wound Wound type: two superficial wounds very likely related to friction and moisture Pressure Injury POA: Yes/No/NA Measurement: Wound bed: these wounds are located at the apex of the intergluteal fold on each buttock.  The wounds beds are pink, the edges are irregular Drainage (amount, consistency, odor) none Periwound: intact Dressing procedure/placement/frequency: a sacral foam dressing is in place.  This should be continued with the foam dressing changed every 3 days and prn soilage. Monitor the area(s) for worsening of condition such as: Signs/symptoms of infection,  Increase in size,  Development of or worsening of odor, Development of pain, or increased pain at the affected locations.  Notify the medical team if any of these develop.  Thank you for the consult. WOC nurse will not follow at this time.  Please re-consult the WOC team if needed.  Helmut Muster, RN, MSN, CWOCN, CNS-BC, pager [phone]

## 2020-03-31 NOTE — [place] FL2 (Signed)
[place] MEDICAID FL2 LEVEL OF CARE SCREENING TOOL     IDENTIFICATION  Patient Name: Travis Cantrell Birthdate: 09/26/1960 Sex: male Admission Date (Current Location): 03/27/2020  [place] and [place] Number:  Haynes Bast [id number] Facility and Address:  [facility], [address] [place], [place] [address]      Provider Number: [id number]  Attending Physician Name and Address:  Cipriano Bunker, MD  Relative Name and Phone Number:  Pearlie Oyster Daughter   [phone]    Current Level of Care: Hospital Recommended Level of Care: Nursing Facility Prior Approval Number:    Date Approved/Denied:   PASRR Number: [id number]  Discharge Plan: Other (Comment) (Long term care, nursing home)    Current Diagnoses: Patient Active Problem List   Diagnosis Date Noted  . Sepsis (HCC) 03/27/2020  . Septic shock (HCC) 03/27/2020  . Malnutrition of moderate degree 01/17/2020  . Pressure injury of skin 01/13/2020  . Decreased ROM of left elbow 06/19/2019  . Contracture of joint of multiple sites 06/19/2019  . Acute pain of left shoulder 06/19/2019  . 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 Trigeminal neuralgia of left side of face 03/20/2018  . MDD (major depressive disorder), recurrent episode, moderate (HCC) 12/01/2017  . Contracture of muscle, right upper arm 12/23/2016  . Neurogenic bladder 10/28/2016  . History of decubitus ulcer 03/31/2016  . Left-sided face pain 02/05/2016  . Recurrent UTI 04/16/2015  . Essential hypertension, benign 11/19/2013  . Seborrheic dermatitis 12/06/2010  . Presence of indwelling urinary catheter 12/06/2010  . High cholesterol 05/25/2010  . Prediabetes 12/25/2009  . ONYCHOMYCOSIS, BILATERAL 09/25/2009  . CONSTIPATION 04/29/2008  . INSOMNIA, CHRONIC 08/29/2007  . Multiple sclerosis, primary progressive (HCC) 08/29/2007  . ALLERGIC RHINITIS 08/29/2007  . GERD 08/29/2007  . 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  See discharge summary.)  AMBULATORY STATUS COMMUNICATION OF NEEDS Skin   Total Care Verbally Other (Comment) (sores below the knee)                       Personal Care Assistance Level of Assistance  Total care       Total Care Assistance: Maximum assistance   Functional Limitations Info  Sight,Hearing,Speech Sight Info: Adequate Hearing Info: Adequate Speech Info: Adequate    SPECIAL CARE FACTORS FREQUENCY                       Contractures Contractures Info: Present    Additional Factors Info  Code Status,Allergies,Insulin Sliding Scale Code Status Info: full Allergies Info: baclofen   Insulin Sliding Scale Info: see discharge summary       Current Medications (03/31/2020):  This is the current hospital active medication list Current Facility-Administered Medications  Medication Dose Route Frequency Provider Last Rate Last Admin  . 0.9 %  sodium chloride infusion  250 mL Intravenous Continuous 05/31/2020, NP      . 0.9 %  sodium chloride infusion  250 mL Intravenous Continuous Bevelyn Ngo, MD      . acetaminophen (TYLENOL) tablet 650 mg  650 mg Oral Q6H PRN Oretha Milch, MD   650 mg at 03/29/20 1609  . cephALEXin (KEFLEX) capsule 500 mg  500  mg Oral Q6H Daiva Eves, Lisette Grinder, MD   500 mg at 03/31/20 1320  . Chlorhexidine Gluconate Cloth 2 % PADS 6 each  6 each Topical Daily Oretha Milch, MD   6 each at 03/31/20 [phone]  . docusate sodium (COLACE) capsule 100 mg  100 mg Oral BID PRN Bevelyn Ngo, NP   100 mg at 03/30/20 2153  . heparin injection 5,000 Units  5,000 Units Subcutaneous Q8H Bevelyn Ngo, NP   5,000 Units at 03/31/20 1320  . hydrocerin (EUCERIN) cream   Topical Daily Albertine Grates, MD   Given at 03/31/20  [phone]  . insulin aspart (novoLOG) injection 0-15 Units  0-15 Units Subcutaneous Q4H Bevelyn Ngo, NP   2 Units at 03/30/20 2142  . ondansetron (ZOFRAN) injection 4 mg  4 mg Intravenous Q6H PRN Bevelyn Ngo, NP      . phosphorus (K PHOS NEUTRAL) tablet 250 mg  250 mg Oral TID Cipriano Bunker, MD   250 mg at 03/31/20 0859  . polyethylene glycol (MIRALAX / GLYCOLAX) packet 17 g  17 g Oral Daily PRN Bevelyn Ngo, NP   17 g at 03/30/20 2153     Discharge Medications: Please see discharge summary for a list of discharge medications.  Relevant Imaging Results:  Relevant Lab Results:   Additional Information SSN: [id number]  Dossie Der Einar Crow, LCSW

## 2020-03-31 NOTE — Plan of Care (Signed)

## 2020-03-31 NOTE — Consult Note (Signed)
[facility]    Date of Admission:  03/27/2020     Total days of antibiotics 5              Reason for Consult: UTI  Referring Provider:  Dwyane Dee Primary Care Provider: Jinny Sanders, MD   ASSESSMENT:  Travis Cantrell is a 60 y/o male with MS with spastic quadriparesis/quadriplegia who is bedbound with neurogenic bladder and indwelling foley cathter admitted with UTI symptoms and cultures positive for Proteus mirabilis. 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 Has completed 4 days of meropenem and currently on Keflex. Recommend additional 2 days of Keflex and then stopping antibiotics which will provide a total of 7 days of therapy. Will need continued follow up with urology for staghorn calculi.   PLAN:  1. Continue Keflex through 3/10 then stop antibiotics.  2. Follow up with Urology for staghorn calculi.    Active Problems:   Sepsis ([place])   Septic shock ([place])   . cephALEXin  500 mg Oral Q6H  . Chlorhexidine Gluconate Cloth  6 each Topical Daily  . heparin  5,000 Units Subcutaneous Q8H  . hydrocerin   Topical Daily  . insulin aspart  0-15 Units Subcutaneous Q4H  . phosphorus  250 mg Oral TID     HPI: Travis Cantrell is a 60 y.o. male with MS with spastic quadriparesis/quadriplegia, bedbound, neurogenic bladder with indwelling Foley catheter admitted from skilled nursing facility with burning with urination.  Mr. Biehn was previously hospitalized from 01/12/2020 to 01/25/2020 with polymicrobial bacteremia with complicated pyelonephritis and septic shock.  Believed to be a urinary source with cultures positive for Proteus mirabilis, Pseudomonas aeruginosa, and Enterococcus faecalis.  Treated with cefepime.  Now admitted with dysuria with urinary cultures positive for Proteus mirabilis.  Blood cultures positive in 1 out of 4 bottles with Staph hominis.  Renal ultrasound with findings of large right renal  staghorn calculus.  Mr. Hinnant has remained afebrile over the last 48 hours.  Initially with mild leukocytosis of 12.4.  Currently on day 5 of antimicrobial therapy having received 4 days of meropenem and 2 days of cephalexin.  Urination currently without dysuria.  Review of Systems: Review of Systems  Constitutional: Negative for chills, fever and weight loss.  Respiratory: Negative for cough, shortness of breath and wheezing.   Cardiovascular: Negative for chest pain and leg swelling.  Gastrointestinal: Negative for abdominal pain, constipation, diarrhea, nausea and vomiting.  Skin: Negative for rash.     Past Medical History:  Diagnosis Date  . Allergy   . Decubitus ulcer   . GERD (gastroesophageal reflux disease)   . Hypertension   . MS (multiple sclerosis) (HCC)     Social History   Tobacco Use  . Smoking status: Former Research scientist (life sciences)  . Smokeless tobacco: Never Used  Substance Use Topics  . Alcohol use: No  . Drug use: No    Family History  Problem Relation Age of Onset  . Cancer Mother        multiple myeloma  . Diabetes Father   . Heart attack Father     Allergies  Allergen Reactions  . Baclofen Diarrhea    OBJECTIVE: Blood pressure 114/86, pulse 79, temperature 98.4 F (36.9 C), temperature source Oral, resp. rate 18, height '5\' 10"'  (1.778 m), weight 74 kg, SpO2 98 %.  Physical Exam Constitutional:      General: He is not in acute distress.  Appearance: He is well-developed and well-nourished.     Comments: Lying in bed with head of bed elevated; pleasant.   Cardiovascular:     Rate and Rhythm: Normal rate and regular rhythm.     Pulses: Intact distal pulses.     Heart sounds: Normal heart sounds.  Pulmonary:     Effort: Pulmonary effort is normal.     Breath sounds: Normal breath sounds.  Musculoskeletal:     Comments: Contractures of bilateral arms.   Skin:    General: Skin is warm and dry.  Neurological:     Mental Status: He is alert.   Psychiatric:        Mood and Affect: Mood and affect normal.     Lab Results Lab Results  Component Value Date   WBC 10.0 03/31/2020   HGB 14.8 03/31/2020   HCT 45.3 03/31/2020   MCV 86.9 03/31/2020   PLT 303 03/31/2020    Lab Results  Component Value Date   CREATININE 1.01 03/31/2020   BUN 14 03/31/2020   NA 136 03/31/2020   K 4.1 03/31/2020   CL 103 03/31/2020   CO2 23 03/31/2020    Lab Results  Component Value Date   ALT 35 01/23/2020   AST 19 01/23/2020   ALKPHOS 65 01/23/2020   BILITOT 0.5 01/23/2020     Microbiology: Recent Results (from the past 240 hour(s))  Urine culture     Status: Abnormal   Collection Time: 03/27/20  9:19 AM   Specimen: Urine, Random  Result Value Ref Range Status   Specimen Description URINE, RANDOM  Final   Special Requests   Final    NONE Performed at [facility], [address] [place], [place] [address]    Culture >=100,000 COLONIES/mL PROTEUS MIRABILIS (A)  Final   Report Status 03/29/2020 FINAL  Final   Organism ID, Bacteria PROTEUS MIRABILIS (A)  Final      Susceptibility   Proteus mirabilis - MIC*    AMPICILLIN RESISTANT Resistant     CEFAZOLIN 8 SENSITIVE Sensitive     CEFEPIME <=0.12 SENSITIVE Sensitive     CEFTRIAXONE <=0.25 SENSITIVE Sensitive     CIPROFLOXACIN >=4 RESISTANT Resistant     GENTAMICIN <=1 SENSITIVE Sensitive     IMIPENEM 8 INTERMEDIATE Intermediate     NITROFURANTOIN 128 RESISTANT Resistant     TRIMETH/SULFA >=320 RESISTANT Resistant     AMPICILLIN/SULBACTAM 8 SENSITIVE Sensitive     PIP/TAZO <=4 SENSITIVE Sensitive     * >=100,000 COLONIES/mL PROTEUS MIRABILIS  Culture, blood (routine x 2)     Status: None (Preliminary result)   Collection Time: 03/27/20 10:16 AM   Specimen: BLOOD RIGHT WRIST  Result Value Ref Range Status   Specimen Description BLOOD RIGHT WRIST  Final   Special Requests   Final    BOTTLES DRAWN AEROBIC AND ANAEROBIC Blood Culture adequate volume   Culture   Final     NO GROWTH 3 DAYS Performed at [facility], [address], [place], [place] [address]    Report Status PENDING  Incomplete  Culture, blood (routine x 2)     Status: Abnormal   Collection Time: 03/27/20 10:28 AM   Specimen: BLOOD RIGHT FOREARM  Result Value Ref Range Status   Specimen Description BLOOD RIGHT FOREARM  Final   Special Requests   Final    BOTTLES DRAWN AEROBIC AND ANAEROBIC Blood Culture adequate volume   Culture  Setup Time   Final  GRAM POSITIVE COCCI IN CLUSTERS ANAEROBIC BOTTLE ONLY CRITICAL RESULT CALLED TO, READ BACK BY AND VERIFIED WITH: CATHY PIERCE PHARMD '@[id number]'  03/28/20 EB    Culture (A)  Final    STAPHYLOCOCCUS HOMINIS THE SIGNIFICANCE OF ISOLATING THIS ORGANISM FROM A SINGLE SET OF BLOOD CULTURES WHEN MULTIPLE SETS ARE DRAWN IS UNCERTAIN. PLEASE NOTIFY THE MICROBIOLOGY DEPARTMENT WITHIN ONE WEEK IF SPECIATION AND SENSITIVITIES ARE REQUIRED. Performed at [facility], [place] [address] [place], [place] [address]    Report Status 03/30/2020 FINAL  Final  Blood Culture ID Panel (Reflexed)     Status: Abnormal   Collection Time: 03/27/20 10:28 AM  Result Value Ref Range Status   Enterococcus faecalis NOT DETECTED NOT DETECTED Final   Enterococcus Faecium NOT DETECTED NOT DETECTED Final   Listeria monocytogenes NOT DETECTED NOT DETECTED Final   Staphylococcus species DETECTED (A) NOT DETECTED Final    Comment: CRITICAL RESULT CALLED TO, READ BACK BY AND VERIFIED WITH: CATHY PIERCE PHARMD '@[id number]'  03/28/20 EB    Staphylococcus aureus (BCID) NOT DETECTED NOT DETECTED Final   Staphylococcus epidermidis NOT DETECTED NOT DETECTED Final   Staphylococcus lugdunensis NOT DETECTED NOT DETECTED Final   Streptococcus species NOT DETECTED NOT DETECTED Final   Streptococcus agalactiae NOT DETECTED NOT DETECTED Final   Streptococcus pneumoniae NOT DETECTED NOT DETECTED Final   Streptococcus pyogenes NOT DETECTED NOT DETECTED Final   A.calcoaceticus-baumannii NOT  DETECTED NOT DETECTED Final   Bacteroides fragilis NOT DETECTED NOT DETECTED Final   Enterobacterales NOT DETECTED NOT DETECTED Final   Enterobacter cloacae complex NOT DETECTED NOT DETECTED Final   Escherichia coli NOT DETECTED NOT DETECTED Final   Klebsiella aerogenes NOT DETECTED NOT DETECTED Final   Klebsiella oxytoca NOT DETECTED NOT DETECTED Final   Klebsiella pneumoniae NOT DETECTED NOT DETECTED Final   Proteus species NOT DETECTED NOT DETECTED Final   Salmonella species NOT DETECTED NOT DETECTED Final   Serratia marcescens NOT DETECTED NOT DETECTED Final   Haemophilus influenzae NOT DETECTED NOT DETECTED Final   Neisseria meningitidis NOT DETECTED NOT DETECTED Final   Pseudomonas aeruginosa NOT DETECTED NOT DETECTED Final   Stenotrophomonas maltophilia NOT DETECTED NOT DETECTED Final   Candida albicans NOT DETECTED NOT DETECTED Final   Candida auris NOT DETECTED NOT DETECTED Final   Candida glabrata NOT DETECTED NOT DETECTED Final   Candida krusei NOT DETECTED NOT DETECTED Final   Candida parapsilosis NOT DETECTED NOT DETECTED Final   Candida tropicalis NOT DETECTED NOT DETECTED Final   Cryptococcus neoformans/gattii NOT DETECTED NOT DETECTED Final    Comment: Performed at [facility], [address], [place], [place] [address]  Resp Panel by RT-PCR (Flu A&B, Covid) Nasopharyngeal Swab     Status: None   Collection Time: 03/27/20 12:35 PM   Specimen: Nasopharyngeal Swab; Nasopharyngeal(NP) swabs in vial transport medium  Result Value Ref Range Status   SARS Coronavirus 2 by RT PCR NEGATIVE NEGATIVE Final    Comment: (NOTE) SARS-CoV-2 target nucleic acids are NOT DETECTED.  The SARS-CoV-2 RNA is generally detectable in upper respiratory specimens during the acute phase of infection. The lowest concentration of SARS-CoV-2 viral copies this assay can detect is 138 copies/mL. A negative result does not preclude SARS-Cov-2 infection and should not be used as the sole  basis for treatment or other patient management decisions. A negative result may occur with  improper specimen collection/handling, submission of specimen other than nasopharyngeal swab, presence of viral mutation(s) within the areas targeted by this assay,  and inadequate number of viral copies(<138 copies/mL). A negative result must be combined with clinical observations, patient history, and epidemiological information. The expected result is Negative.  Fact Sheet for Patients:  EntrepreneurPulse.com.au  Fact Sheet for Healthcare Providers:  IncredibleEmployment.be  This test is no t yet approved or cleared by the [place] FDA and  has been authorized for detection and/or diagnosis of SARS-CoV-2 by FDA under an Emergency Use Authorization (EUA). This EUA will remain  in effect (meaning this test can be used) for the duration of the COVID-19 declaration under Section 564(b)(1) of the Act, 21 U.S.C.section 360bbb-3(b)(1), unless the authorization is terminated  or revoked sooner.       Influenza A by PCR NEGATIVE NEGATIVE Final   Influenza B by PCR NEGATIVE NEGATIVE Final    Comment: (NOTE) The Xpert Xpress SARS-CoV-2/FLU/RSV plus assay is intended as an aid in the diagnosis of influenza from Nasopharyngeal swab specimens and should not be used as a sole basis for treatment. Nasal washings and aspirates are unacceptable for Xpert Xpress SARS-CoV-2/FLU/RSV testing.  Fact Sheet for Patients: EntrepreneurPulse.com.au  Fact Sheet for Healthcare Providers: IncredibleEmployment.be  This test is not yet approved or cleared by the [place] FDA and has been authorized for detection and/or diagnosis of SARS-CoV-2 by FDA under an Emergency Use Authorization (EUA). This EUA will remain in effect (meaning this test can be used) for the duration of the COVID-19 declaration under Section 564(b)(1) of the Act,  21 U.S.C. section 360bbb-3(b)(1), unless the authorization is terminated or revoked.  Performed at [facility], [place] [address], [place], [place] [address]   MRSA PCR Screening     Status: None   Collection Time: 03/28/20  3:05 AM   Specimen: Nasopharyngeal  Result Value Ref Range Status   MRSA by PCR NEGATIVE NEGATIVE Final    Comment:        The GeneXpert MRSA Assay (FDA approved for NASAL specimens only), is one component of a comprehensive MRSA colonization surveillance program. It is not intended to diagnose MRSA infection nor to guide or monitor treatment for MRSA infections. Performed at [facility], [place] [address], [place], [place] [address]      Terri Piedra, [facility]  03/31/2020  12:49 PM

## 2020-03-31 NOTE — Plan of Care (Signed)
  Problem: Activity: Goal: Risk for activity intolerance will decrease Outcome: Progressing   Problem: Coping: Goal: Level of anxiety will decrease Outcome: Progressing   Problem: Elimination: Goal: Will not experience complications related to bowel motility Outcome: Progressing   
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
## 2020-04-01 ENCOUNTER — Other Ambulatory Visit (HOSPITAL_COMMUNITY): Payer: Self-pay | Admitting: Family Medicine

## 2020-04-01 LAB — BASIC METABOLIC PANEL
Anion gap: 11 (ref 5–15)
BUN: 22 mg/dL — ABNORMAL HIGH (ref 6–20)
CO2: 21 mmol/L — ABNORMAL LOW (ref 22–32)
Calcium: 9 mg/dL (ref 8.9–10.3)
Chloride: 104 mmol/L (ref 98–111)
Creatinine, Ser: 1.01 mg/dL (ref 0.61–1.24)
GFR, Estimated: >60 mL/min (ref >60)
Glucose, Bld: 90 mg/dL (ref 70–99)
Potassium: 3.9 mmol/L (ref 3.5–5.1)
Sodium: 136 mmol/L (ref 135–145)

## 2020-04-01 LAB — CULTURE, BLOOD (ROUTINE X 2)
Culture: NO GROWTH
Special Requests: ADEQUATE

## 2020-04-01 LAB — CBC
HCT: 43.7 % (ref 39.0–52.0)
Hemoglobin: 14.2 g/dL (ref 13.0–17.0)
MCH: 28.1 pg (ref 26.0–34.0)
MCHC: 32.5 g/dL (ref 30.0–36.0)
MCV: 86.4 fL (ref 80.0–100.0)
Platelets: 302 10*3/uL (ref 150–400)
RBC: 5.06 MIL/uL (ref 4.22–5.81)
RDW: 15.1 % (ref 11.5–15.5)
WBC: 9.1 10*3/uL (ref 4.0–10.5)
nRBC: 0 % (ref 0.0–0.2)

## 2020-04-01 LAB — GLUCOSE, CAPILLARY
Glucose-Capillary: 92 mg/dL (ref 70–99)
Glucose-Capillary: 94 mg/dL (ref 70–99)
Glucose-Capillary: 130 mg/dL — ABNORMAL HIGH (ref 70–99)

## 2020-04-01 MED ORDER — CEPHALEXIN 500 MG PO CAPS: 500.0000 mg | ORAL_CAPSULE | Freq: Four times a day (QID) | ORAL | 0 refills | Status: DC

## 2020-04-01 MED FILL — CEPHALEXIN 500 MG CAPSULE: 500 | 1 days supply | Qty: 4 | Fill #0

## 2020-04-01 NOTE — Discharge Summary (Addendum)
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 [place], [place] [address]  Resp Panel by RT-PCR (Flu A&B, Covid) Nasopharyngeal Swab     Status: None   Collection Time: 03/27/20 12:35 PM   Specimen: Nasopharyngeal Swab; Nasopharyngeal(NP) swabs in vial transport medium  Result Value Ref Range Status   SARS Coronavirus 2 by RT PCR NEGATIVE NEGATIVE Final    Comment: (NOTE) SARS-CoV-2 target nucleic acids are NOT DETECTED.  The SARS-CoV-2 RNA is generally detectable in upper respiratory specimens during the acute phase of infection. The lowest concentration of SARS-CoV-2  viral copies this assay can detect is 138 copies/mL. A negative result does not preclude SARS-Cov-2 infection and should not be used as the sole basis for treatment or other patient management decisions. A negative result may occur with  improper specimen collection/handling, submission of specimen other than nasopharyngeal swab, presence of viral mutation(s) within the areas targeted by this assay, and inadequate number of viral copies(<138 copies/mL). A negative result must be combined with clinical observations, patient history, and epidemiological information. The expected result is Negative.  Fact Sheet for Patients:  BloggerCourse.com  Fact Sheet for Healthcare Providers:  SeriousBroker.it  This test is no t yet approved or cleared by the [place] FDA and  has been authorized for detection and/or diagnosis of SARS-CoV-2 by FDA under an Emergency Use Authorization (EUA). This EUA will remain  in effect (meaning this test can be used) for the duration of the COVID-19 declaration under Section 564(b)(1) of the Act, 21 U.S.C.section 360bbb-3(b)(1), unless the authorization is terminated  or revoked sooner.       Influenza A by PCR NEGATIVE NEGATIVE Final   Influenza B by PCR NEGATIVE NEGATIVE Final    Comment: (NOTE) The Xpert Xpress SARS-CoV-2/FLU/RSV plus assay is intended as an aid in the diagnosis of influenza from Nasopharyngeal swab specimens and should not be used as a sole basis for treatment. Nasal washings and aspirates are unacceptable for Xpert Xpress SARS-CoV-2/FLU/RSV testing.  Fact Sheet for Patients: BloggerCourse.com  Fact Sheet for Healthcare Providers: SeriousBroker.it  This test is not yet approved or cleared by the [place] FDA and has been authorized for detection and/or diagnosis of SARS-CoV-2 by FDA under an Emergency Use Authorization  (EUA). This EUA will remain in effect (meaning this test can be used) for the duration of the COVID-19 declaration under Section 564(b)(1) of the Act, 21 U.S.C. section 360bbb-3(b)(1), unless the authorization is terminated or revoked.  Performed at [facility], [address], [place], [place] [address]   MRSA PCR Screening     Status: None   Collection Time: 03/28/20  3:05 AM   Specimen: Nasopharyngeal  Result Value Ref Range Status   MRSA by PCR NEGATIVE NEGATIVE Final    Comment:        The GeneXpert MRSA Assay (FDA approved for NASAL specimens only), is one component  of a comprehensive MRSA colonization surveillance program. It is not intended to diagnose MRSA infection nor to guide or monitor treatment for MRSA infections. Performed at [facility], [address], [place], [place] [address]      Time coordinating discharge: Over 30 minutes  SIGNED:   Cipriano Bunker, MD  [facility] 04/03/2020, 5:20 PM Pager   If 7PM-7AM, please contact night-coverage [email or web address]

## 2020-04-01 NOTE — Plan of Care (Signed)

## 2020-04-01 NOTE — TOC Transition Note (Addendum)
Transition of Care [facility]) - CM/SW Discharge Note   Patient Details  Name: Travis Cantrell MRN: [id number] Date of Birth: 10/03/1960  Transition of [facility] CM/SW Contact:  Lorri Frederick, LCSW Phone Number: 04/01/2020, 12:02 PM   Clinical Narrative:  Pt discharging to [facility], Room 157.Marland Kitchen  RN call report to [phone], ask for [facility].     Final next level of care: Long Term Nursing Home Barriers to Discharge: Barriers Resolved   Patient Goals and CMS Choice Patient states their goals for this hospitalization and ongoing recovery are:: "get the use of my arms back" CMS Medicare.gov Compare Post Acute Care list provided to::  (NA: pt from [facility], wants to return)    Discharge Placement              Patient chooses bed at:  ([facility]) Patient to be transferred to facility by: [facility] Name of family member notified: sister Graciella Belton.  Unable to reach daughter Selena Batten Patient and family notified of of transfer: 04/01/20  Discharge Plan and Services     Post Acute Care Choice: Skilled Nursing Facility                               Social Determinants of Health (SDOH) Interventions     Readmission Risk Interventions No flowsheet data found.

## 2020-04-01 NOTE — Discharge Instructions (Signed)
Advised to follow-up with primary care physician in 1 week. Advised to follow-up with urology for staghorn calculi. Advised to take Keflex 500 mg every 4 hours for 1 da to complete total 10-day treatment.

## 2020-04-03 DIAGNOSIS — M24542 Contracture, left hand: Secondary | ICD-10-CM | POA: Diagnosis not present

## 2020-04-03 DIAGNOSIS — I69352 Hemiplegia and hemiparesis following cerebral infarction affecting left dominant side: Secondary | ICD-10-CM | POA: Diagnosis not present

## 2020-04-03 DIAGNOSIS — I69351 Hemiplegia and hemiparesis following cerebral infarction affecting right dominant side: Secondary | ICD-10-CM | POA: Diagnosis not present

## 2020-04-03 DIAGNOSIS — M24522 Contracture, left elbow: Secondary | ICD-10-CM | POA: Diagnosis not present

## 2020-04-03 DIAGNOSIS — R1312 Dysphagia, oropharyngeal phase: Secondary | ICD-10-CM | POA: Diagnosis not present

## 2020-04-03 DIAGNOSIS — M24541 Contracture, right hand: Secondary | ICD-10-CM | POA: Diagnosis not present

## 2020-04-03 DIAGNOSIS — M24521 Contracture, right elbow: Secondary | ICD-10-CM | POA: Diagnosis not present

## 2020-04-03 DIAGNOSIS — G35 Multiple sclerosis: Secondary | ICD-10-CM | POA: Diagnosis not present

## 2020-04-04 DIAGNOSIS — M24521 Contracture, right elbow: Secondary | ICD-10-CM | POA: Diagnosis not present

## 2020-04-04 DIAGNOSIS — R1312 Dysphagia, oropharyngeal phase: Secondary | ICD-10-CM | POA: Diagnosis not present

## 2020-04-04 DIAGNOSIS — M24542 Contracture, left hand: Secondary | ICD-10-CM | POA: Diagnosis not present

## 2020-04-04 DIAGNOSIS — M24522 Contracture, left elbow: Secondary | ICD-10-CM | POA: Diagnosis not present

## 2020-04-04 DIAGNOSIS — G35 Multiple sclerosis: Secondary | ICD-10-CM | POA: Diagnosis not present

## 2020-04-04 DIAGNOSIS — I69352 Hemiplegia and hemiparesis following cerebral infarction affecting left dominant side: Secondary | ICD-10-CM | POA: Diagnosis not present

## 2020-04-04 DIAGNOSIS — I69351 Hemiplegia and hemiparesis following cerebral infarction affecting right dominant side: Secondary | ICD-10-CM | POA: Diagnosis not present

## 2020-04-04 DIAGNOSIS — M24541 Contracture, right hand: Secondary | ICD-10-CM | POA: Diagnosis not present

## 2020-04-06 DIAGNOSIS — N200 Calculus of kidney: Secondary | ICD-10-CM | POA: Diagnosis not present

## 2020-04-06 DIAGNOSIS — N319 Neuromuscular dysfunction of bladder, unspecified: Secondary | ICD-10-CM | POA: Diagnosis not present

## 2020-04-06 DIAGNOSIS — N3020 Other chronic cystitis without hematuria: Secondary | ICD-10-CM | POA: Diagnosis not present

## 2020-04-07 DIAGNOSIS — M24541 Contracture, right hand: Secondary | ICD-10-CM | POA: Diagnosis not present

## 2020-04-07 DIAGNOSIS — R1312 Dysphagia, oropharyngeal phase: Secondary | ICD-10-CM | POA: Diagnosis not present

## 2020-04-07 DIAGNOSIS — M24521 Contracture, right elbow: Secondary | ICD-10-CM | POA: Diagnosis not present

## 2020-04-07 DIAGNOSIS — E559 Vitamin D deficiency, unspecified: Secondary | ICD-10-CM | POA: Diagnosis not present

## 2020-04-07 DIAGNOSIS — I69351 Hemiplegia and hemiparesis following cerebral infarction affecting right dominant side: Secondary | ICD-10-CM | POA: Diagnosis not present

## 2020-04-07 DIAGNOSIS — R52 Pain, unspecified: Secondary | ICD-10-CM | POA: Diagnosis not present

## 2020-04-07 DIAGNOSIS — I69352 Hemiplegia and hemiparesis following cerebral infarction affecting left dominant side: Secondary | ICD-10-CM | POA: Diagnosis not present

## 2020-04-07 DIAGNOSIS — M24542 Contracture, left hand: Secondary | ICD-10-CM | POA: Diagnosis not present

## 2020-04-07 DIAGNOSIS — G35 Multiple sclerosis: Secondary | ICD-10-CM | POA: Diagnosis not present

## 2020-04-07 DIAGNOSIS — M24522 Contracture, left elbow: Secondary | ICD-10-CM | POA: Diagnosis not present

## 2020-04-08 DIAGNOSIS — G35 Multiple sclerosis: Secondary | ICD-10-CM | POA: Diagnosis not present

## 2020-04-08 DIAGNOSIS — L304 Erythema intertrigo: Secondary | ICD-10-CM | POA: Diagnosis not present

## 2020-04-08 DIAGNOSIS — M24522 Contracture, left elbow: Secondary | ICD-10-CM | POA: Diagnosis not present

## 2020-04-08 DIAGNOSIS — R1312 Dysphagia, oropharyngeal phase: Secondary | ICD-10-CM | POA: Diagnosis not present

## 2020-04-08 DIAGNOSIS — M24521 Contracture, right elbow: Secondary | ICD-10-CM | POA: Diagnosis not present

## 2020-04-08 DIAGNOSIS — M24541 Contracture, right hand: Secondary | ICD-10-CM | POA: Diagnosis not present

## 2020-04-08 DIAGNOSIS — I69351 Hemiplegia and hemiparesis following cerebral infarction affecting right dominant side: Secondary | ICD-10-CM | POA: Diagnosis not present

## 2020-04-08 DIAGNOSIS — M6281 Muscle weakness (generalized): Secondary | ICD-10-CM | POA: Diagnosis not present

## 2020-04-08 DIAGNOSIS — I69352 Hemiplegia and hemiparesis following cerebral infarction affecting left dominant side: Secondary | ICD-10-CM | POA: Diagnosis not present

## 2020-04-08 DIAGNOSIS — M24542 Contracture, left hand: Secondary | ICD-10-CM | POA: Diagnosis not present

## 2020-04-09 DIAGNOSIS — M24541 Contracture, right hand: Secondary | ICD-10-CM | POA: Diagnosis not present

## 2020-04-09 DIAGNOSIS — I69351 Hemiplegia and hemiparesis following cerebral infarction affecting right dominant side: Secondary | ICD-10-CM | POA: Diagnosis not present

## 2020-04-09 DIAGNOSIS — M24542 Contracture, left hand: Secondary | ICD-10-CM | POA: Diagnosis not present

## 2020-04-09 DIAGNOSIS — G35 Multiple sclerosis: Secondary | ICD-10-CM | POA: Diagnosis not present

## 2020-04-09 DIAGNOSIS — M24521 Contracture, right elbow: Secondary | ICD-10-CM | POA: Diagnosis not present

## 2020-04-09 DIAGNOSIS — M24522 Contracture, left elbow: Secondary | ICD-10-CM | POA: Diagnosis not present

## 2020-04-09 DIAGNOSIS — R1312 Dysphagia, oropharyngeal phase: Secondary | ICD-10-CM | POA: Diagnosis not present

## 2020-04-09 DIAGNOSIS — I69352 Hemiplegia and hemiparesis following cerebral infarction affecting left dominant side: Secondary | ICD-10-CM | POA: Diagnosis not present

## 2020-04-10 DIAGNOSIS — R1312 Dysphagia, oropharyngeal phase: Secondary | ICD-10-CM | POA: Diagnosis not present

## 2020-04-10 DIAGNOSIS — G35 Multiple sclerosis: Secondary | ICD-10-CM | POA: Diagnosis not present

## 2020-04-10 DIAGNOSIS — M24542 Contracture, left hand: Secondary | ICD-10-CM | POA: Diagnosis not present

## 2020-04-10 DIAGNOSIS — M24521 Contracture, right elbow: Secondary | ICD-10-CM | POA: Diagnosis not present

## 2020-04-10 DIAGNOSIS — M24541 Contracture, right hand: Secondary | ICD-10-CM | POA: Diagnosis not present

## 2020-04-10 DIAGNOSIS — M24522 Contracture, left elbow: Secondary | ICD-10-CM | POA: Diagnosis not present

## 2020-04-10 DIAGNOSIS — I69351 Hemiplegia and hemiparesis following cerebral infarction affecting right dominant side: Secondary | ICD-10-CM | POA: Diagnosis not present

## 2020-04-10 DIAGNOSIS — I69352 Hemiplegia and hemiparesis following cerebral infarction affecting left dominant side: Secondary | ICD-10-CM | POA: Diagnosis not present

## 2020-04-11 DIAGNOSIS — M24541 Contracture, right hand: Secondary | ICD-10-CM | POA: Diagnosis not present

## 2020-04-11 DIAGNOSIS — M24521 Contracture, right elbow: Secondary | ICD-10-CM | POA: Diagnosis not present

## 2020-04-11 DIAGNOSIS — M24522 Contracture, left elbow: Secondary | ICD-10-CM | POA: Diagnosis not present

## 2020-04-11 DIAGNOSIS — M24542 Contracture, left hand: Secondary | ICD-10-CM | POA: Diagnosis not present

## 2020-04-11 DIAGNOSIS — R1312 Dysphagia, oropharyngeal phase: Secondary | ICD-10-CM | POA: Diagnosis not present

## 2020-04-11 DIAGNOSIS — I69352 Hemiplegia and hemiparesis following cerebral infarction affecting left dominant side: Secondary | ICD-10-CM | POA: Diagnosis not present

## 2020-04-11 DIAGNOSIS — G35 Multiple sclerosis: Secondary | ICD-10-CM | POA: Diagnosis not present

## 2020-04-11 DIAGNOSIS — I69351 Hemiplegia and hemiparesis following cerebral infarction affecting right dominant side: Secondary | ICD-10-CM | POA: Diagnosis not present

## 2020-04-14 DIAGNOSIS — R1312 Dysphagia, oropharyngeal phase: Secondary | ICD-10-CM | POA: Diagnosis not present

## 2020-04-14 DIAGNOSIS — N319 Neuromuscular dysfunction of bladder, unspecified: Secondary | ICD-10-CM | POA: Diagnosis not present

## 2020-04-14 DIAGNOSIS — M24542 Contracture, left hand: Secondary | ICD-10-CM | POA: Diagnosis not present

## 2020-04-14 DIAGNOSIS — M24521 Contracture, right elbow: Secondary | ICD-10-CM | POA: Diagnosis not present

## 2020-04-14 DIAGNOSIS — G35 Multiple sclerosis: Secondary | ICD-10-CM | POA: Diagnosis not present

## 2020-04-14 DIAGNOSIS — I69352 Hemiplegia and hemiparesis following cerebral infarction affecting left dominant side: Secondary | ICD-10-CM | POA: Diagnosis not present

## 2020-04-14 DIAGNOSIS — M24541 Contracture, right hand: Secondary | ICD-10-CM | POA: Diagnosis not present

## 2020-04-14 DIAGNOSIS — I69351 Hemiplegia and hemiparesis following cerebral infarction affecting right dominant side: Secondary | ICD-10-CM | POA: Diagnosis not present

## 2020-04-14 DIAGNOSIS — M24522 Contracture, left elbow: Secondary | ICD-10-CM | POA: Diagnosis not present

## 2020-04-15 DIAGNOSIS — M24521 Contracture, right elbow: Secondary | ICD-10-CM | POA: Diagnosis not present

## 2020-04-15 DIAGNOSIS — I69352 Hemiplegia and hemiparesis following cerebral infarction affecting left dominant side: Secondary | ICD-10-CM | POA: Diagnosis not present

## 2020-04-15 DIAGNOSIS — G35 Multiple sclerosis: Secondary | ICD-10-CM | POA: Diagnosis not present

## 2020-04-15 DIAGNOSIS — M24541 Contracture, right hand: Secondary | ICD-10-CM | POA: Diagnosis not present

## 2020-04-15 DIAGNOSIS — M24542 Contracture, left hand: Secondary | ICD-10-CM | POA: Diagnosis not present

## 2020-04-15 DIAGNOSIS — R1312 Dysphagia, oropharyngeal phase: Secondary | ICD-10-CM | POA: Diagnosis not present

## 2020-04-15 DIAGNOSIS — M24522 Contracture, left elbow: Secondary | ICD-10-CM | POA: Diagnosis not present

## 2020-04-15 DIAGNOSIS — I69351 Hemiplegia and hemiparesis following cerebral infarction affecting right dominant side: Secondary | ICD-10-CM | POA: Diagnosis not present

## 2020-04-16 DIAGNOSIS — G35 Multiple sclerosis: Secondary | ICD-10-CM | POA: Diagnosis not present

## 2020-04-16 DIAGNOSIS — I69352 Hemiplegia and hemiparesis following cerebral infarction affecting left dominant side: Secondary | ICD-10-CM | POA: Diagnosis not present

## 2020-04-16 DIAGNOSIS — M24522 Contracture, left elbow: Secondary | ICD-10-CM | POA: Diagnosis not present

## 2020-04-16 DIAGNOSIS — M24541 Contracture, right hand: Secondary | ICD-10-CM | POA: Diagnosis not present

## 2020-04-16 DIAGNOSIS — R1312 Dysphagia, oropharyngeal phase: Secondary | ICD-10-CM | POA: Diagnosis not present

## 2020-04-16 DIAGNOSIS — M24521 Contracture, right elbow: Secondary | ICD-10-CM | POA: Diagnosis not present

## 2020-04-16 DIAGNOSIS — M24542 Contracture, left hand: Secondary | ICD-10-CM | POA: Diagnosis not present

## 2020-04-16 DIAGNOSIS — I69351 Hemiplegia and hemiparesis following cerebral infarction affecting right dominant side: Secondary | ICD-10-CM | POA: Diagnosis not present

## 2020-04-17 DIAGNOSIS — M24522 Contracture, left elbow: Secondary | ICD-10-CM | POA: Diagnosis not present

## 2020-04-17 DIAGNOSIS — G35 Multiple sclerosis: Secondary | ICD-10-CM | POA: Diagnosis not present

## 2020-04-17 DIAGNOSIS — M24521 Contracture, right elbow: Secondary | ICD-10-CM | POA: Diagnosis not present

## 2020-04-17 DIAGNOSIS — R1312 Dysphagia, oropharyngeal phase: Secondary | ICD-10-CM | POA: Diagnosis not present

## 2020-04-17 DIAGNOSIS — M6281 Muscle weakness (generalized): Secondary | ICD-10-CM | POA: Diagnosis not present

## 2020-04-17 DIAGNOSIS — I69352 Hemiplegia and hemiparesis following cerebral infarction affecting left dominant side: Secondary | ICD-10-CM | POA: Diagnosis not present

## 2020-04-17 DIAGNOSIS — I69351 Hemiplegia and hemiparesis following cerebral infarction affecting right dominant side: Secondary | ICD-10-CM | POA: Diagnosis not present

## 2020-04-17 DIAGNOSIS — L304 Erythema intertrigo: Secondary | ICD-10-CM | POA: Diagnosis not present

## 2020-04-17 DIAGNOSIS — M24542 Contracture, left hand: Secondary | ICD-10-CM | POA: Diagnosis not present

## 2020-04-17 DIAGNOSIS — M24541 Contracture, right hand: Secondary | ICD-10-CM | POA: Diagnosis not present

## 2020-04-18 DIAGNOSIS — M24522 Contracture, left elbow: Secondary | ICD-10-CM | POA: Diagnosis not present

## 2020-04-18 DIAGNOSIS — R1312 Dysphagia, oropharyngeal phase: Secondary | ICD-10-CM | POA: Diagnosis not present

## 2020-04-18 DIAGNOSIS — I69352 Hemiplegia and hemiparesis following cerebral infarction affecting left dominant side: Secondary | ICD-10-CM | POA: Diagnosis not present

## 2020-04-18 DIAGNOSIS — G35 Multiple sclerosis: Secondary | ICD-10-CM | POA: Diagnosis not present

## 2020-04-18 DIAGNOSIS — M24541 Contracture, right hand: Secondary | ICD-10-CM | POA: Diagnosis not present

## 2020-04-18 DIAGNOSIS — M24542 Contracture, left hand: Secondary | ICD-10-CM | POA: Diagnosis not present

## 2020-04-18 DIAGNOSIS — M24521 Contracture, right elbow: Secondary | ICD-10-CM | POA: Diagnosis not present

## 2020-04-18 DIAGNOSIS — I69351 Hemiplegia and hemiparesis following cerebral infarction affecting right dominant side: Secondary | ICD-10-CM | POA: Diagnosis not present

## 2020-04-21 DIAGNOSIS — R1312 Dysphagia, oropharyngeal phase: Secondary | ICD-10-CM | POA: Diagnosis not present

## 2020-04-21 DIAGNOSIS — M24522 Contracture, left elbow: Secondary | ICD-10-CM | POA: Diagnosis not present

## 2020-04-21 DIAGNOSIS — M24542 Contracture, left hand: Secondary | ICD-10-CM | POA: Diagnosis not present

## 2020-04-21 DIAGNOSIS — I69351 Hemiplegia and hemiparesis following cerebral infarction affecting right dominant side: Secondary | ICD-10-CM | POA: Diagnosis not present

## 2020-04-21 DIAGNOSIS — G35 Multiple sclerosis: Secondary | ICD-10-CM | POA: Diagnosis not present

## 2020-04-21 DIAGNOSIS — I69352 Hemiplegia and hemiparesis following cerebral infarction affecting left dominant side: Secondary | ICD-10-CM | POA: Diagnosis not present

## 2020-04-21 DIAGNOSIS — M24521 Contracture, right elbow: Secondary | ICD-10-CM | POA: Diagnosis not present

## 2020-04-21 DIAGNOSIS — M24541 Contracture, right hand: Secondary | ICD-10-CM | POA: Diagnosis not present

## 2020-04-22 DIAGNOSIS — I69351 Hemiplegia and hemiparesis following cerebral infarction affecting right dominant side: Secondary | ICD-10-CM | POA: Diagnosis not present

## 2020-04-22 DIAGNOSIS — L97929 Non-pressure chronic ulcer of unspecified part of left lower leg with unspecified severity: Secondary | ICD-10-CM | POA: Diagnosis not present

## 2020-04-22 DIAGNOSIS — M24541 Contracture, right hand: Secondary | ICD-10-CM | POA: Diagnosis not present

## 2020-04-22 DIAGNOSIS — M24522 Contracture, left elbow: Secondary | ICD-10-CM | POA: Diagnosis not present

## 2020-04-22 DIAGNOSIS — I69352 Hemiplegia and hemiparesis following cerebral infarction affecting left dominant side: Secondary | ICD-10-CM | POA: Diagnosis not present

## 2020-04-22 DIAGNOSIS — L304 Erythema intertrigo: Secondary | ICD-10-CM | POA: Diagnosis not present

## 2020-04-22 DIAGNOSIS — R1312 Dysphagia, oropharyngeal phase: Secondary | ICD-10-CM | POA: Diagnosis not present

## 2020-04-22 DIAGNOSIS — G35 Multiple sclerosis: Secondary | ICD-10-CM | POA: Diagnosis not present

## 2020-04-22 DIAGNOSIS — M24521 Contracture, right elbow: Secondary | ICD-10-CM | POA: Diagnosis not present

## 2020-04-22 DIAGNOSIS — L97918 Non-pressure chronic ulcer of unspecified part of right lower leg with other specified severity: Secondary | ICD-10-CM | POA: Diagnosis not present

## 2020-04-22 DIAGNOSIS — M24542 Contracture, left hand: Secondary | ICD-10-CM | POA: Diagnosis not present

## 2020-04-23 DIAGNOSIS — M24521 Contracture, right elbow: Secondary | ICD-10-CM | POA: Diagnosis not present

## 2020-04-23 DIAGNOSIS — M24541 Contracture, right hand: Secondary | ICD-10-CM | POA: Diagnosis not present

## 2020-04-23 DIAGNOSIS — I69352 Hemiplegia and hemiparesis following cerebral infarction affecting left dominant side: Secondary | ICD-10-CM | POA: Diagnosis not present

## 2020-04-23 DIAGNOSIS — R1312 Dysphagia, oropharyngeal phase: Secondary | ICD-10-CM | POA: Diagnosis not present

## 2020-04-23 DIAGNOSIS — M24522 Contracture, left elbow: Secondary | ICD-10-CM | POA: Diagnosis not present

## 2020-04-23 DIAGNOSIS — M24542 Contracture, left hand: Secondary | ICD-10-CM | POA: Diagnosis not present

## 2020-04-23 DIAGNOSIS — G35 Multiple sclerosis: Secondary | ICD-10-CM | POA: Diagnosis not present

## 2020-04-23 DIAGNOSIS — I69351 Hemiplegia and hemiparesis following cerebral infarction affecting right dominant side: Secondary | ICD-10-CM | POA: Diagnosis not present

## 2020-04-24 DIAGNOSIS — R1312 Dysphagia, oropharyngeal phase: Secondary | ICD-10-CM | POA: Diagnosis not present

## 2020-04-24 DIAGNOSIS — M24541 Contracture, right hand: Secondary | ICD-10-CM | POA: Diagnosis not present

## 2020-04-24 DIAGNOSIS — R339 Retention of urine, unspecified: Secondary | ICD-10-CM | POA: Diagnosis not present

## 2020-04-24 DIAGNOSIS — M24521 Contracture, right elbow: Secondary | ICD-10-CM | POA: Diagnosis not present

## 2020-04-24 DIAGNOSIS — G35 Multiple sclerosis: Secondary | ICD-10-CM | POA: Diagnosis not present

## 2020-04-24 DIAGNOSIS — M24522 Contracture, left elbow: Secondary | ICD-10-CM | POA: Diagnosis not present

## 2020-04-24 DIAGNOSIS — I69351 Hemiplegia and hemiparesis following cerebral infarction affecting right dominant side: Secondary | ICD-10-CM | POA: Diagnosis not present

## 2020-04-24 DIAGNOSIS — M24542 Contracture, left hand: Secondary | ICD-10-CM | POA: Diagnosis not present

## 2020-04-24 DIAGNOSIS — I69352 Hemiplegia and hemiparesis following cerebral infarction affecting left dominant side: Secondary | ICD-10-CM | POA: Diagnosis not present

## 2020-04-25 DIAGNOSIS — I69352 Hemiplegia and hemiparesis following cerebral infarction affecting left dominant side: Secondary | ICD-10-CM | POA: Diagnosis not present

## 2020-04-25 DIAGNOSIS — R1312 Dysphagia, oropharyngeal phase: Secondary | ICD-10-CM | POA: Diagnosis not present

## 2020-04-25 DIAGNOSIS — I69351 Hemiplegia and hemiparesis following cerebral infarction affecting right dominant side: Secondary | ICD-10-CM | POA: Diagnosis not present

## 2020-04-25 DIAGNOSIS — M24541 Contracture, right hand: Secondary | ICD-10-CM | POA: Diagnosis not present

## 2020-04-25 DIAGNOSIS — G35 Multiple sclerosis: Secondary | ICD-10-CM | POA: Diagnosis not present

## 2020-04-25 DIAGNOSIS — M24522 Contracture, left elbow: Secondary | ICD-10-CM | POA: Diagnosis not present

## 2020-04-25 DIAGNOSIS — M24521 Contracture, right elbow: Secondary | ICD-10-CM | POA: Diagnosis not present

## 2020-04-25 DIAGNOSIS — M24542 Contracture, left hand: Secondary | ICD-10-CM | POA: Diagnosis not present

## 2020-04-28 ENCOUNTER — Telehealth: Payer: Self-pay | Admitting: Family Medicine

## 2020-04-28 DIAGNOSIS — M24522 Contracture, left elbow: Secondary | ICD-10-CM | POA: Diagnosis not present

## 2020-04-28 DIAGNOSIS — M24542 Contracture, left hand: Secondary | ICD-10-CM | POA: Diagnosis not present

## 2020-04-28 DIAGNOSIS — M24521 Contracture, right elbow: Secondary | ICD-10-CM | POA: Diagnosis not present

## 2020-04-28 DIAGNOSIS — R1312 Dysphagia, oropharyngeal phase: Secondary | ICD-10-CM | POA: Diagnosis not present

## 2020-04-28 DIAGNOSIS — G35 Multiple sclerosis: Secondary | ICD-10-CM | POA: Diagnosis not present

## 2020-04-28 DIAGNOSIS — I69352 Hemiplegia and hemiparesis following cerebral infarction affecting left dominant side: Secondary | ICD-10-CM | POA: Diagnosis not present

## 2020-04-28 DIAGNOSIS — M24541 Contracture, right hand: Secondary | ICD-10-CM | POA: Diagnosis not present

## 2020-04-28 DIAGNOSIS — I69351 Hemiplegia and hemiparesis following cerebral infarction affecting right dominant side: Secondary | ICD-10-CM | POA: Diagnosis not present

## 2020-04-28 NOTE — Telephone Encounter (Signed)
Called pt to schedule AWV with [facility]. Patient stated he will call back to schedule,

## 2020-04-29 DIAGNOSIS — L97918 Non-pressure chronic ulcer of unspecified part of right lower leg with other specified severity: Secondary | ICD-10-CM | POA: Diagnosis not present

## 2020-04-29 DIAGNOSIS — L97929 Non-pressure chronic ulcer of unspecified part of left lower leg with unspecified severity: Secondary | ICD-10-CM | POA: Diagnosis not present

## 2020-04-29 DIAGNOSIS — L304 Erythema intertrigo: Secondary | ICD-10-CM | POA: Diagnosis not present

## 2020-04-29 DIAGNOSIS — I69351 Hemiplegia and hemiparesis following cerebral infarction affecting right dominant side: Secondary | ICD-10-CM | POA: Diagnosis not present

## 2020-04-29 DIAGNOSIS — M24542 Contracture, left hand: Secondary | ICD-10-CM | POA: Diagnosis not present

## 2020-04-29 DIAGNOSIS — R1312 Dysphagia, oropharyngeal phase: Secondary | ICD-10-CM | POA: Diagnosis not present

## 2020-04-29 DIAGNOSIS — I69352 Hemiplegia and hemiparesis following cerebral infarction affecting left dominant side: Secondary | ICD-10-CM | POA: Diagnosis not present

## 2020-04-29 DIAGNOSIS — M24541 Contracture, right hand: Secondary | ICD-10-CM | POA: Diagnosis not present

## 2020-04-29 DIAGNOSIS — M24522 Contracture, left elbow: Secondary | ICD-10-CM | POA: Diagnosis not present

## 2020-04-29 DIAGNOSIS — M24521 Contracture, right elbow: Secondary | ICD-10-CM | POA: Diagnosis not present

## 2020-04-29 DIAGNOSIS — G35 Multiple sclerosis: Secondary | ICD-10-CM | POA: Diagnosis not present

## 2020-04-30 DIAGNOSIS — M24522 Contracture, left elbow: Secondary | ICD-10-CM | POA: Diagnosis not present

## 2020-04-30 DIAGNOSIS — I69351 Hemiplegia and hemiparesis following cerebral infarction affecting right dominant side: Secondary | ICD-10-CM | POA: Diagnosis not present

## 2020-04-30 DIAGNOSIS — M24542 Contracture, left hand: Secondary | ICD-10-CM | POA: Diagnosis not present

## 2020-04-30 DIAGNOSIS — R1312 Dysphagia, oropharyngeal phase: Secondary | ICD-10-CM | POA: Diagnosis not present

## 2020-04-30 DIAGNOSIS — M24521 Contracture, right elbow: Secondary | ICD-10-CM | POA: Diagnosis not present

## 2020-04-30 DIAGNOSIS — I69352 Hemiplegia and hemiparesis following cerebral infarction affecting left dominant side: Secondary | ICD-10-CM | POA: Diagnosis not present

## 2020-04-30 DIAGNOSIS — G35 Multiple sclerosis: Secondary | ICD-10-CM | POA: Diagnosis not present

## 2020-04-30 DIAGNOSIS — M24541 Contracture, right hand: Secondary | ICD-10-CM | POA: Diagnosis not present

## 2020-05-01 DIAGNOSIS — M24521 Contracture, right elbow: Secondary | ICD-10-CM | POA: Diagnosis not present

## 2020-05-01 DIAGNOSIS — M24542 Contracture, left hand: Secondary | ICD-10-CM | POA: Diagnosis not present

## 2020-05-01 DIAGNOSIS — M24541 Contracture, right hand: Secondary | ICD-10-CM | POA: Diagnosis not present

## 2020-05-01 DIAGNOSIS — I69352 Hemiplegia and hemiparesis following cerebral infarction affecting left dominant side: Secondary | ICD-10-CM | POA: Diagnosis not present

## 2020-05-01 DIAGNOSIS — G35 Multiple sclerosis: Secondary | ICD-10-CM | POA: Diagnosis not present

## 2020-05-01 DIAGNOSIS — I69351 Hemiplegia and hemiparesis following cerebral infarction affecting right dominant side: Secondary | ICD-10-CM | POA: Diagnosis not present

## 2020-05-01 DIAGNOSIS — M24522 Contracture, left elbow: Secondary | ICD-10-CM | POA: Diagnosis not present

## 2020-05-01 DIAGNOSIS — R1312 Dysphagia, oropharyngeal phase: Secondary | ICD-10-CM | POA: Diagnosis not present

## 2020-05-02 DIAGNOSIS — G35 Multiple sclerosis: Secondary | ICD-10-CM | POA: Diagnosis not present

## 2020-05-02 DIAGNOSIS — M24522 Contracture, left elbow: Secondary | ICD-10-CM | POA: Diagnosis not present

## 2020-05-02 DIAGNOSIS — I69351 Hemiplegia and hemiparesis following cerebral infarction affecting right dominant side: Secondary | ICD-10-CM | POA: Diagnosis not present

## 2020-05-02 DIAGNOSIS — I69352 Hemiplegia and hemiparesis following cerebral infarction affecting left dominant side: Secondary | ICD-10-CM | POA: Diagnosis not present

## 2020-05-02 DIAGNOSIS — R1312 Dysphagia, oropharyngeal phase: Secondary | ICD-10-CM | POA: Diagnosis not present

## 2020-05-02 DIAGNOSIS — M24521 Contracture, right elbow: Secondary | ICD-10-CM | POA: Diagnosis not present

## 2020-05-02 DIAGNOSIS — M24541 Contracture, right hand: Secondary | ICD-10-CM | POA: Diagnosis not present

## 2020-05-02 DIAGNOSIS — M24542 Contracture, left hand: Secondary | ICD-10-CM | POA: Diagnosis not present

## 2020-05-05 DIAGNOSIS — G35 Multiple sclerosis: Secondary | ICD-10-CM | POA: Diagnosis not present

## 2020-05-05 DIAGNOSIS — M24542 Contracture, left hand: Secondary | ICD-10-CM | POA: Diagnosis not present

## 2020-05-05 DIAGNOSIS — M24522 Contracture, left elbow: Secondary | ICD-10-CM | POA: Diagnosis not present

## 2020-05-05 DIAGNOSIS — R1312 Dysphagia, oropharyngeal phase: Secondary | ICD-10-CM | POA: Diagnosis not present

## 2020-05-05 DIAGNOSIS — M24521 Contracture, right elbow: Secondary | ICD-10-CM | POA: Diagnosis not present

## 2020-05-05 DIAGNOSIS — M24541 Contracture, right hand: Secondary | ICD-10-CM | POA: Diagnosis not present

## 2020-05-05 DIAGNOSIS — I69352 Hemiplegia and hemiparesis following cerebral infarction affecting left dominant side: Secondary | ICD-10-CM | POA: Diagnosis not present

## 2020-05-05 DIAGNOSIS — I69351 Hemiplegia and hemiparesis following cerebral infarction affecting right dominant side: Secondary | ICD-10-CM | POA: Diagnosis not present

## 2020-05-06 DIAGNOSIS — L97929 Non-pressure chronic ulcer of unspecified part of left lower leg with unspecified severity: Secondary | ICD-10-CM | POA: Diagnosis not present

## 2020-05-06 DIAGNOSIS — I69351 Hemiplegia and hemiparesis following cerebral infarction affecting right dominant side: Secondary | ICD-10-CM | POA: Diagnosis not present

## 2020-05-06 DIAGNOSIS — M24541 Contracture, right hand: Secondary | ICD-10-CM | POA: Diagnosis not present

## 2020-05-06 DIAGNOSIS — L304 Erythema intertrigo: Secondary | ICD-10-CM | POA: Diagnosis not present

## 2020-05-06 DIAGNOSIS — R1312 Dysphagia, oropharyngeal phase: Secondary | ICD-10-CM | POA: Diagnosis not present

## 2020-05-06 DIAGNOSIS — I69352 Hemiplegia and hemiparesis following cerebral infarction affecting left dominant side: Secondary | ICD-10-CM | POA: Diagnosis not present

## 2020-05-06 DIAGNOSIS — M24542 Contracture, left hand: Secondary | ICD-10-CM | POA: Diagnosis not present

## 2020-05-06 DIAGNOSIS — M24521 Contracture, right elbow: Secondary | ICD-10-CM | POA: Diagnosis not present

## 2020-05-06 DIAGNOSIS — M24522 Contracture, left elbow: Secondary | ICD-10-CM | POA: Diagnosis not present

## 2020-05-06 DIAGNOSIS — L97918 Non-pressure chronic ulcer of unspecified part of right lower leg with other specified severity: Secondary | ICD-10-CM | POA: Diagnosis not present

## 2020-05-06 DIAGNOSIS — G35 Multiple sclerosis: Secondary | ICD-10-CM | POA: Diagnosis not present

## 2020-05-08 DIAGNOSIS — M24541 Contracture, right hand: Secondary | ICD-10-CM | POA: Diagnosis not present

## 2020-05-08 DIAGNOSIS — M24522 Contracture, left elbow: Secondary | ICD-10-CM | POA: Diagnosis not present

## 2020-05-08 DIAGNOSIS — R1312 Dysphagia, oropharyngeal phase: Secondary | ICD-10-CM | POA: Diagnosis not present

## 2020-05-08 DIAGNOSIS — G35 Multiple sclerosis: Secondary | ICD-10-CM | POA: Diagnosis not present

## 2020-05-08 DIAGNOSIS — I69352 Hemiplegia and hemiparesis following cerebral infarction affecting left dominant side: Secondary | ICD-10-CM | POA: Diagnosis not present

## 2020-05-08 DIAGNOSIS — M24521 Contracture, right elbow: Secondary | ICD-10-CM | POA: Diagnosis not present

## 2020-05-08 DIAGNOSIS — M24542 Contracture, left hand: Secondary | ICD-10-CM | POA: Diagnosis not present

## 2020-05-08 DIAGNOSIS — I69351 Hemiplegia and hemiparesis following cerebral infarction affecting right dominant side: Secondary | ICD-10-CM | POA: Diagnosis not present

## 2020-05-09 DIAGNOSIS — I69352 Hemiplegia and hemiparesis following cerebral infarction affecting left dominant side: Secondary | ICD-10-CM | POA: Diagnosis not present

## 2020-05-09 DIAGNOSIS — M24522 Contracture, left elbow: Secondary | ICD-10-CM | POA: Diagnosis not present

## 2020-05-09 DIAGNOSIS — M24542 Contracture, left hand: Secondary | ICD-10-CM | POA: Diagnosis not present

## 2020-05-09 DIAGNOSIS — R1312 Dysphagia, oropharyngeal phase: Secondary | ICD-10-CM | POA: Diagnosis not present

## 2020-05-09 DIAGNOSIS — M24521 Contracture, right elbow: Secondary | ICD-10-CM | POA: Diagnosis not present

## 2020-05-09 DIAGNOSIS — I69351 Hemiplegia and hemiparesis following cerebral infarction affecting right dominant side: Secondary | ICD-10-CM | POA: Diagnosis not present

## 2020-05-09 DIAGNOSIS — M24541 Contracture, right hand: Secondary | ICD-10-CM | POA: Diagnosis not present

## 2020-05-09 DIAGNOSIS — G35 Multiple sclerosis: Secondary | ICD-10-CM | POA: Diagnosis not present

## 2020-05-10 DIAGNOSIS — M24522 Contracture, left elbow: Secondary | ICD-10-CM | POA: Diagnosis not present

## 2020-05-10 DIAGNOSIS — R1312 Dysphagia, oropharyngeal phase: Secondary | ICD-10-CM | POA: Diagnosis not present

## 2020-05-10 DIAGNOSIS — I69352 Hemiplegia and hemiparesis following cerebral infarction affecting left dominant side: Secondary | ICD-10-CM | POA: Diagnosis not present

## 2020-05-10 DIAGNOSIS — M24541 Contracture, right hand: Secondary | ICD-10-CM | POA: Diagnosis not present

## 2020-05-10 DIAGNOSIS — G35 Multiple sclerosis: Secondary | ICD-10-CM | POA: Diagnosis not present

## 2020-05-10 DIAGNOSIS — M24521 Contracture, right elbow: Secondary | ICD-10-CM | POA: Diagnosis not present

## 2020-05-10 DIAGNOSIS — M24542 Contracture, left hand: Secondary | ICD-10-CM | POA: Diagnosis not present

## 2020-05-10 DIAGNOSIS — I69351 Hemiplegia and hemiparesis following cerebral infarction affecting right dominant side: Secondary | ICD-10-CM | POA: Diagnosis not present

## 2020-05-11 DIAGNOSIS — M24522 Contracture, left elbow: Secondary | ICD-10-CM | POA: Diagnosis not present

## 2020-05-11 DIAGNOSIS — M24541 Contracture, right hand: Secondary | ICD-10-CM | POA: Diagnosis not present

## 2020-05-11 DIAGNOSIS — R1312 Dysphagia, oropharyngeal phase: Secondary | ICD-10-CM | POA: Diagnosis not present

## 2020-05-11 DIAGNOSIS — M24542 Contracture, left hand: Secondary | ICD-10-CM | POA: Diagnosis not present

## 2020-05-11 DIAGNOSIS — I69351 Hemiplegia and hemiparesis following cerebral infarction affecting right dominant side: Secondary | ICD-10-CM | POA: Diagnosis not present

## 2020-05-11 DIAGNOSIS — I69352 Hemiplegia and hemiparesis following cerebral infarction affecting left dominant side: Secondary | ICD-10-CM | POA: Diagnosis not present

## 2020-05-11 DIAGNOSIS — G35 Multiple sclerosis: Secondary | ICD-10-CM | POA: Diagnosis not present

## 2020-05-11 DIAGNOSIS — M24521 Contracture, right elbow: Secondary | ICD-10-CM | POA: Diagnosis not present

## 2020-05-13 DIAGNOSIS — M24522 Contracture, left elbow: Secondary | ICD-10-CM | POA: Diagnosis not present

## 2020-05-13 DIAGNOSIS — L97929 Non-pressure chronic ulcer of unspecified part of left lower leg with unspecified severity: Secondary | ICD-10-CM | POA: Diagnosis not present

## 2020-05-13 DIAGNOSIS — I69352 Hemiplegia and hemiparesis following cerebral infarction affecting left dominant side: Secondary | ICD-10-CM | POA: Diagnosis not present

## 2020-05-13 DIAGNOSIS — I69351 Hemiplegia and hemiparesis following cerebral infarction affecting right dominant side: Secondary | ICD-10-CM | POA: Diagnosis not present

## 2020-05-13 DIAGNOSIS — M24541 Contracture, right hand: Secondary | ICD-10-CM | POA: Diagnosis not present

## 2020-05-13 DIAGNOSIS — M24521 Contracture, right elbow: Secondary | ICD-10-CM | POA: Diagnosis not present

## 2020-05-13 DIAGNOSIS — R1312 Dysphagia, oropharyngeal phase: Secondary | ICD-10-CM | POA: Diagnosis not present

## 2020-05-13 DIAGNOSIS — L304 Erythema intertrigo: Secondary | ICD-10-CM | POA: Diagnosis not present

## 2020-05-13 DIAGNOSIS — M24542 Contracture, left hand: Secondary | ICD-10-CM | POA: Diagnosis not present

## 2020-05-13 DIAGNOSIS — G35 Multiple sclerosis: Secondary | ICD-10-CM | POA: Diagnosis not present

## 2020-05-13 DIAGNOSIS — L97918 Non-pressure chronic ulcer of unspecified part of right lower leg with other specified severity: Secondary | ICD-10-CM | POA: Diagnosis not present

## 2020-05-20 DIAGNOSIS — G35 Multiple sclerosis: Secondary | ICD-10-CM | POA: Diagnosis not present

## 2020-05-20 DIAGNOSIS — L304 Erythema intertrigo: Secondary | ICD-10-CM | POA: Diagnosis not present

## 2020-05-20 DIAGNOSIS — L97918 Non-pressure chronic ulcer of unspecified part of right lower leg with other specified severity: Secondary | ICD-10-CM | POA: Diagnosis not present

## 2020-05-20 DIAGNOSIS — L97929 Non-pressure chronic ulcer of unspecified part of left lower leg with unspecified severity: Secondary | ICD-10-CM | POA: Diagnosis not present

## 2020-05-22 DIAGNOSIS — R339 Retention of urine, unspecified: Secondary | ICD-10-CM | POA: Diagnosis not present

## 2020-05-27 DIAGNOSIS — L304 Erythema intertrigo: Secondary | ICD-10-CM | POA: Diagnosis not present

## 2020-05-27 DIAGNOSIS — L97918 Non-pressure chronic ulcer of unspecified part of right lower leg with other specified severity: Secondary | ICD-10-CM | POA: Diagnosis not present

## 2020-05-27 DIAGNOSIS — L97929 Non-pressure chronic ulcer of unspecified part of left lower leg with unspecified severity: Secondary | ICD-10-CM | POA: Diagnosis not present

## 2020-05-27 DIAGNOSIS — G35 Multiple sclerosis: Secondary | ICD-10-CM | POA: Diagnosis not present

## 2020-06-01 DIAGNOSIS — G8250 Quadriplegia, unspecified: Secondary | ICD-10-CM | POA: Diagnosis not present

## 2020-06-01 DIAGNOSIS — N319 Neuromuscular dysfunction of bladder, unspecified: Secondary | ICD-10-CM | POA: Diagnosis not present

## 2020-06-01 DIAGNOSIS — G500 Trigeminal neuralgia: Secondary | ICD-10-CM | POA: Diagnosis not present

## 2020-06-01 DIAGNOSIS — K219 Gastro-esophageal reflux disease without esophagitis: Secondary | ICD-10-CM | POA: Diagnosis not present

## 2020-06-01 DIAGNOSIS — E785 Hyperlipidemia, unspecified: Secondary | ICD-10-CM | POA: Diagnosis not present

## 2020-06-01 DIAGNOSIS — G35 Multiple sclerosis: Secondary | ICD-10-CM | POA: Diagnosis not present

## 2020-06-01 DIAGNOSIS — I10 Essential (primary) hypertension: Secondary | ICD-10-CM | POA: Diagnosis not present

## 2020-06-03 DIAGNOSIS — L97918 Non-pressure chronic ulcer of unspecified part of right lower leg with other specified severity: Secondary | ICD-10-CM | POA: Diagnosis not present

## 2020-06-03 DIAGNOSIS — L304 Erythema intertrigo: Secondary | ICD-10-CM | POA: Diagnosis not present

## 2020-06-03 DIAGNOSIS — G35 Multiple sclerosis: Secondary | ICD-10-CM | POA: Diagnosis not present

## 2020-06-03 DIAGNOSIS — L97929 Non-pressure chronic ulcer of unspecified part of left lower leg with unspecified severity: Secondary | ICD-10-CM | POA: Diagnosis not present

## 2020-06-05 DIAGNOSIS — E118 Type 2 diabetes mellitus with unspecified complications: Secondary | ICD-10-CM | POA: Diagnosis not present

## 2020-06-09 DIAGNOSIS — L97822 Non-pressure chronic ulcer of other part of left lower leg with fat layer exposed: Secondary | ICD-10-CM | POA: Diagnosis not present

## 2020-06-09 DIAGNOSIS — L97811 Non-pressure chronic ulcer of other part of right lower leg limited to breakdown of skin: Secondary | ICD-10-CM | POA: Diagnosis not present

## 2020-06-10 DIAGNOSIS — L304 Erythema intertrigo: Secondary | ICD-10-CM | POA: Diagnosis not present

## 2020-06-10 DIAGNOSIS — G35 Multiple sclerosis: Secondary | ICD-10-CM | POA: Diagnosis not present

## 2020-06-10 DIAGNOSIS — L97929 Non-pressure chronic ulcer of unspecified part of left lower leg with unspecified severity: Secondary | ICD-10-CM | POA: Diagnosis not present

## 2020-06-10 DIAGNOSIS — L97918 Non-pressure chronic ulcer of unspecified part of right lower leg with other specified severity: Secondary | ICD-10-CM | POA: Diagnosis not present

## 2020-06-15 DIAGNOSIS — R52 Pain, unspecified: Secondary | ICD-10-CM | POA: Diagnosis not present

## 2020-06-15 DIAGNOSIS — K219 Gastro-esophageal reflux disease without esophagitis: Secondary | ICD-10-CM | POA: Diagnosis not present

## 2020-06-15 DIAGNOSIS — G8250 Quadriplegia, unspecified: Secondary | ICD-10-CM | POA: Diagnosis not present

## 2020-06-15 DIAGNOSIS — G500 Trigeminal neuralgia: Secondary | ICD-10-CM | POA: Diagnosis not present

## 2020-06-15 DIAGNOSIS — K5909 Other constipation: Secondary | ICD-10-CM | POA: Diagnosis not present

## 2020-06-15 DIAGNOSIS — E785 Hyperlipidemia, unspecified: Secondary | ICD-10-CM | POA: Diagnosis not present

## 2020-06-15 DIAGNOSIS — I10 Essential (primary) hypertension: Secondary | ICD-10-CM | POA: Diagnosis not present

## 2020-06-17 DIAGNOSIS — L97918 Non-pressure chronic ulcer of unspecified part of right lower leg with other specified severity: Secondary | ICD-10-CM | POA: Diagnosis not present

## 2020-06-17 DIAGNOSIS — L304 Erythema intertrigo: Secondary | ICD-10-CM | POA: Diagnosis not present

## 2020-06-17 DIAGNOSIS — L97929 Non-pressure chronic ulcer of unspecified part of left lower leg with unspecified severity: Secondary | ICD-10-CM | POA: Diagnosis not present

## 2020-06-17 DIAGNOSIS — G35 Multiple sclerosis: Secondary | ICD-10-CM | POA: Diagnosis not present

## 2020-06-19 DIAGNOSIS — R339 Retention of urine, unspecified: Secondary | ICD-10-CM | POA: Diagnosis not present

## 2020-08-04 ENCOUNTER — Encounter: Payer: Self-pay | Admitting: Neurology

## 2020-08-04 ENCOUNTER — Telehealth: Payer: Self-pay | Admitting: Neurology

## 2020-08-04 ENCOUNTER — Other Ambulatory Visit: Payer: Self-pay | Admitting: *Deleted

## 2020-08-04 ENCOUNTER — Ambulatory Visit (INDEPENDENT_AMBULATORY_CARE_PROVIDER_SITE_OTHER): Payer: Medicare Other | Admitting: Neurology

## 2020-08-04 VITALS — BP 100/68 | HR 67 | Ht 70.0 in

## 2020-08-04 DIAGNOSIS — G35 Multiple sclerosis: Secondary | ICD-10-CM

## 2020-08-04 DIAGNOSIS — G8250 Quadriplegia, unspecified: Secondary | ICD-10-CM

## 2020-08-04 DIAGNOSIS — G500 Trigeminal neuralgia: Secondary | ICD-10-CM

## 2020-08-04 DIAGNOSIS — N319 Neuromuscular dysfunction of bladder, unspecified: Secondary | ICD-10-CM

## 2020-08-04 MED ORDER — TIZANIDINE HCL 4 MG PO TABS: 4.0000 mg | ORAL_TABLET | Freq: Four times a day (QID) | ORAL | 11 refills | Status: AC

## 2020-08-04 NOTE — Telephone Encounter (Signed)
Spoke w/ Dr. Epimenio Foot. D/c baclofen. Would like to increase tizanidine 4mg  po QID instead from BID. E-scribed updated rx to pharmacy.

## 2020-08-04 NOTE — Telephone Encounter (Signed)
[facility]ier) called, shows on paperwork Dr. Epimenio Foot prescribed Baclofen 10 mg 3 times a day. In our notes shows he's allergic to Baclofen. Would like a call from the nurse to verify.

## 2020-08-04 NOTE — Telephone Encounter (Signed)
Called the pharmacy back and provided a verbal to pharmacist
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
## 2020-11-05 ENCOUNTER — Other Ambulatory Visit: Payer: Self-pay

## 2020-11-05 ENCOUNTER — Encounter: Payer: Self-pay | Admitting: Neurology

## 2020-11-05 ENCOUNTER — Ambulatory Visit (INDEPENDENT_AMBULATORY_CARE_PROVIDER_SITE_OTHER): Payer: Medicare Other | Admitting: Neurology

## 2020-11-05 VITALS — BP 77/55 | HR 60 | Ht 70.0 in

## 2020-11-05 DIAGNOSIS — N319 Neuromuscular dysfunction of bladder, unspecified: Secondary | ICD-10-CM | POA: Diagnosis not present

## 2020-11-05 DIAGNOSIS — G35 Multiple sclerosis: Secondary | ICD-10-CM | POA: Diagnosis not present

## 2020-11-05 DIAGNOSIS — G8250 Quadriplegia, unspecified: Secondary | ICD-10-CM

## 2020-11-05 DIAGNOSIS — G500 Trigeminal neuralgia: Secondary | ICD-10-CM
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
## 2021-03-29 ENCOUNTER — Ambulatory Visit: Payer: Medicare Other | Admitting: Urology

## 2021-05-06 ENCOUNTER — Telehealth: Payer: Self-pay | Admitting: *Deleted

## 2021-05-06 ENCOUNTER — Ambulatory Visit (INDEPENDENT_AMBULATORY_CARE_PROVIDER_SITE_OTHER): Payer: Medicare (Managed Care) | Admitting: Neurology

## 2021-05-06 ENCOUNTER — Encounter: Payer: Self-pay | Admitting: Neurology

## 2021-05-06 VITALS — BP 92/58 | HR 57 | Ht 70.0 in

## 2021-05-06 DIAGNOSIS — G35 Multiple sclerosis: Secondary | ICD-10-CM | POA: Diagnosis not present

## 2021-05-06 DIAGNOSIS — G8250 Quadriplegia, unspecified: Secondary | ICD-10-CM

## 2021-05-06 DIAGNOSIS — N319 Neuromuscular dysfunction of bladder, unspecified: Secondary | ICD-10-CM | POA: Diagnosis not present

## 2021-05-06 DIAGNOSIS — G500 Trigeminal neuralgia: Secondary | ICD-10-CM
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
## 2021-05-06 NOTE — Telephone Encounter (Signed)
Called [facility]) at [place] to get fax # to send order from Dr. Epimenio Foot below. Fax: [phone]. I faxed and received fax confirmation. ? ? ?

## 2021-08-02 ENCOUNTER — Emergency Department (HOSPITAL_COMMUNITY): Payer: Medicare (Managed Care)

## 2021-08-02 ENCOUNTER — Inpatient Hospital Stay (HOSPITAL_COMMUNITY)
Admission: EM | Admit: 2021-08-02 | Discharge: 2021-08-24 | DRG: 698 | Disposition: E | Payer: Medicare (Managed Care) | Source: Skilled Nursing Facility | Attending: Pulmonary Disease | Admitting: Pulmonary Disease

## 2021-08-02 DIAGNOSIS — R739 Hyperglycemia, unspecified: Secondary | ICD-10-CM | POA: Diagnosis present

## 2021-08-02 DIAGNOSIS — A419 Sepsis, unspecified organism: Secondary | ICD-10-CM | POA: Diagnosis present

## 2021-08-02 DIAGNOSIS — G9340 Encephalopathy, unspecified: Secondary | ICD-10-CM | POA: Diagnosis present

## 2021-08-02 DIAGNOSIS — Z9359 Other cystostomy status: Secondary | ICD-10-CM

## 2021-08-02 DIAGNOSIS — Z833 Family history of diabetes mellitus: Secondary | ICD-10-CM

## 2021-08-02 DIAGNOSIS — R570 Cardiogenic shock: Secondary | ICD-10-CM | POA: Diagnosis present

## 2021-08-02 DIAGNOSIS — Z6823 Body mass index (BMI) 23.0-23.9, adult: Secondary | ICD-10-CM

## 2021-08-02 DIAGNOSIS — L89153 Pressure ulcer of sacral region, stage 3: Secondary | ICD-10-CM | POA: Diagnosis present

## 2021-08-02 DIAGNOSIS — N179 Acute kidney failure, unspecified: Secondary | ICD-10-CM | POA: Diagnosis present

## 2021-08-02 DIAGNOSIS — E872 Acidosis, unspecified: Secondary | ICD-10-CM | POA: Diagnosis present

## 2021-08-02 DIAGNOSIS — G8250 Quadriplegia, unspecified: Secondary | ICD-10-CM | POA: Diagnosis present

## 2021-08-02 DIAGNOSIS — I469 Cardiac arrest, cause unspecified: Secondary | ICD-10-CM | POA: Diagnosis present

## 2021-08-02 DIAGNOSIS — Z87440 Personal history of urinary (tract) infections: Secondary | ICD-10-CM

## 2021-08-02 DIAGNOSIS — E43 Unspecified severe protein-calorie malnutrition: Secondary | ICD-10-CM | POA: Diagnosis present

## 2021-08-02 DIAGNOSIS — G35 Multiple sclerosis: Secondary | ICD-10-CM | POA: Diagnosis present

## 2021-08-02 DIAGNOSIS — N319 Neuromuscular dysfunction of bladder, unspecified: Secondary | ICD-10-CM | POA: Diagnosis present

## 2021-08-02 DIAGNOSIS — N390 Urinary tract infection, site not specified: Secondary | ICD-10-CM | POA: Diagnosis present

## 2021-08-02 DIAGNOSIS — Z66 Do not resuscitate: Secondary | ICD-10-CM | POA: Diagnosis present

## 2021-08-02 DIAGNOSIS — Z7401 Bed confinement status: Secondary | ICD-10-CM

## 2021-08-02 DIAGNOSIS — J690 Pneumonitis due to inhalation of food and vomit: Secondary | ICD-10-CM | POA: Diagnosis present

## 2021-08-02 DIAGNOSIS — D649 Anemia, unspecified: Secondary | ICD-10-CM | POA: Diagnosis present

## 2021-08-02 DIAGNOSIS — K219 Gastro-esophageal reflux disease without esophagitis: Secondary | ICD-10-CM | POA: Diagnosis present

## 2021-08-02 DIAGNOSIS — Z20822 Contact with and (suspected) exposure to covid-19: Secondary | ICD-10-CM | POA: Diagnosis present

## 2021-08-02 DIAGNOSIS — R6521 Severe sepsis with septic shock: Secondary | ICD-10-CM | POA: Diagnosis present

## 2021-08-02 DIAGNOSIS — D689 Coagulation defect, unspecified: Secondary | ICD-10-CM | POA: Diagnosis present

## 2021-08-02 DIAGNOSIS — F32A Depression, unspecified: Secondary | ICD-10-CM | POA: Diagnosis present

## 2021-08-02 DIAGNOSIS — I5181 Takotsubo syndrome: Secondary | ICD-10-CM | POA: Diagnosis present

## 2021-08-02 DIAGNOSIS — Z79899 Other long term (current) drug therapy: Secondary | ICD-10-CM

## 2021-08-02 DIAGNOSIS — E870 Hyperosmolality and hypernatremia: Secondary | ICD-10-CM | POA: Diagnosis present

## 2021-08-02 DIAGNOSIS — I10 Essential (primary) hypertension: Secondary | ICD-10-CM | POA: Diagnosis present

## 2021-08-02 DIAGNOSIS — T83518A Infection and inflammatory reaction due to other urinary catheter, initial encounter: Secondary | ICD-10-CM | POA: Diagnosis not present

## 2021-08-02 DIAGNOSIS — E876 Hypokalemia: Secondary | ICD-10-CM | POA: Diagnosis present

## 2021-08-02 DIAGNOSIS — E785 Hyperlipidemia, unspecified: Secondary | ICD-10-CM | POA: Diagnosis present

## 2021-08-02 DIAGNOSIS — Y846 Urinary catheterization as the cause of abnormal reaction of the patient, or of later complication, without mention of misadventure at the time of the procedure: Secondary | ICD-10-CM | POA: Diagnosis present

## 2021-08-02 DIAGNOSIS — Z7189 Other specified counseling: Secondary | ICD-10-CM

## 2021-08-02 DIAGNOSIS — J9601 Acute respiratory failure with hypoxia: Secondary | ICD-10-CM | POA: Diagnosis present

## 2021-08-02 DIAGNOSIS — L89893 Pressure ulcer of other site, stage 3: Secondary | ICD-10-CM | POA: Diagnosis present

## 2021-08-02 DIAGNOSIS — Z515 Encounter for palliative care: Secondary | ICD-10-CM

## 2021-08-02 DIAGNOSIS — Z8249 Family history of ischemic heart disease and other diseases of the circulatory system: Secondary | ICD-10-CM

## 2021-08-02 DIAGNOSIS — Z87891 Personal history of nicotine dependence: Secondary | ICD-10-CM

## 2021-08-02 LAB — CBC WITH DIFFERENTIAL/PLATELET
Abs Immature Granulocytes: 0 10*3/uL (ref 0.00–0.07)
Basophils Absolute: 0 10*3/uL (ref 0.0–0.1)
Basophils Relative: 0 %
Eosinophils Absolute: 0.3 10*3/uL (ref 0.0–0.5)
Eosinophils Relative: 1 %
HCT: 42.6 % (ref 39.0–52.0)
Hemoglobin: 12.5 g/dL — ABNORMAL LOW (ref 13.0–17.0)
Lymphocytes Relative: 21 %
Lymphs Abs: 6.7 10*3/uL — ABNORMAL HIGH (ref 0.7–4.0)
MCH: 27.9 pg (ref 26.0–34.0)
MCHC: 29.3 g/dL — ABNORMAL LOW (ref 30.0–36.0)
MCV: 95.1 fL (ref 80.0–100.0)
Monocytes Absolute: 1.3 10*3/uL — ABNORMAL HIGH (ref 0.1–1.0)
Monocytes Relative: 4 %
Neutro Abs: 23.6 10*3/uL — ABNORMAL HIGH (ref 1.7–7.7)
Neutrophils Relative %: 74 %
Platelets: 553 10*3/uL — ABNORMAL HIGH (ref 150–400)
RBC: 4.48 MIL/uL (ref 4.22–5.81)
RDW: 15.8 % — ABNORMAL HIGH (ref 11.5–15.5)
WBC: 31.9 10*3/uL — ABNORMAL HIGH (ref 4.0–10.5)
nRBC: 0 % (ref 0.0–0.2)
nRBC: 0 /100 WBC

## 2021-08-02 LAB — I-STAT CHEM 8, ED
BUN: 32 mg/dL — ABNORMAL HIGH (ref 8–23)
Calcium, Ion: 1.08 mmol/L — ABNORMAL LOW (ref 1.15–1.40)
Chloride: 116 mmol/L — ABNORMAL HIGH (ref 98–111)
Creatinine, Ser: 1.1 mg/dL (ref 0.61–1.24)
Glucose, Bld: 259 mg/dL — ABNORMAL HIGH (ref 70–99)
HCT: 40 % (ref 39.0–52.0)
Hemoglobin: 13.6 g/dL (ref 13.0–17.0)
Potassium: 3.7 mmol/L (ref 3.5–5.1)
Sodium: 149 mmol/L — ABNORMAL HIGH (ref 135–145)
TCO2: 17 mmol/L — ABNORMAL LOW (ref 22–32)

## 2021-08-02 LAB — COMPREHENSIVE METABOLIC PANEL
ALT: 50 U/L — ABNORMAL HIGH (ref 0–44)
AST: 78 U/L — ABNORMAL HIGH (ref 15–41)
Albumin: 2 g/dL — ABNORMAL LOW (ref 3.5–5.0)
Alkaline Phosphatase: 116 U/L (ref 38–126)
Anion gap: 17 — ABNORMAL HIGH (ref 5–15)
BUN: 26 mg/dL — ABNORMAL HIGH (ref 8–23)
CO2: 14 mmol/L — ABNORMAL LOW (ref 22–32)
Calcium: 8 mg/dL — ABNORMAL LOW (ref 8.9–10.3)
Chloride: 116 mmol/L — ABNORMAL HIGH (ref 98–111)
Creatinine, Ser: 1.42 mg/dL — ABNORMAL HIGH (ref 0.61–1.24)
GFR, Estimated: 56 mL/min — ABNORMAL LOW (ref >60)
Glucose, Bld: 277 mg/dL — ABNORMAL HIGH (ref 70–99)
Potassium: 3.4 mmol/L — ABNORMAL LOW (ref 3.5–5.1)
Sodium: 147 mmol/L — ABNORMAL HIGH (ref 135–145)
Total Bilirubin: 0.4 mg/dL (ref 0.3–1.2)
Total Protein: 5.5 g/dL — ABNORMAL LOW (ref 6.5–8.1)

## 2021-08-02 LAB — TROPONIN I (HIGH SENSITIVITY): Troponin I (High Sensitivity): 591 ng/L (ref <18)

## 2021-08-02 LAB — I-STAT VENOUS BLOOD GAS, ED
Acid-base deficit: 17 mmol/L — ABNORMAL HIGH (ref 0.0–2.0)
Bicarbonate: 14.2 mmol/L — ABNORMAL LOW (ref 20.0–28.0)
Calcium, Ion: 1.1 mmol/L — ABNORMAL LOW (ref 1.15–1.40)
HCT: 39 % (ref 39.0–52.0)
Hemoglobin: 13.3 g/dL (ref 13.0–17.0)
O2 Saturation: 99 %
Potassium: 3.7 mmol/L (ref 3.5–5.1)
Sodium: 148 mmol/L — ABNORMAL HIGH (ref 135–145)
TCO2: 16 mmol/L — ABNORMAL LOW (ref 22–32)
pCO2, Ven: 54.7 mmHg (ref 44–60)
pH, Ven: 7.021 — CL (ref 7.25–7.43)
pO2, Ven: 173 mmHg — ABNORMAL HIGH (ref 32–45)

## 2021-08-02 LAB — I-STAT ARTERIAL BLOOD GAS, ED
Acid-base deficit: 8 mmol/L — ABNORMAL HIGH (ref 0.0–2.0)
Bicarbonate: 18.5 mmol/L — ABNORMAL LOW (ref 20.0–28.0)
Calcium, Ion: 1.18 mmol/L (ref 1.15–1.40)
HCT: 29 % — ABNORMAL LOW (ref 39.0–52.0)
Hemoglobin: 9.9 g/dL — ABNORMAL LOW (ref 13.0–17.0)
O2 Saturation: 100 %
Patient temperature: 97.9
Potassium: 3.1 mmol/L — ABNORMAL LOW (ref 3.5–5.1)
Sodium: 148 mmol/L — ABNORMAL HIGH (ref 135–145)
TCO2: 20 mmol/L — ABNORMAL LOW (ref 22–32)
pCO2 arterial: 41.6 mmHg (ref 32–48)
pH, Arterial: 7.253 — ABNORMAL LOW (ref 7.35–7.45)
pO2, Arterial: 308 mmHg — ABNORMAL HIGH (ref 83–108)

## 2021-08-02 LAB — D-DIMER, QUANTITATIVE: D-Dimer, Quant: >20 ug/mL-FEU — ABNORMAL HIGH (ref 0.00–0.50)

## 2021-08-02 LAB — PROTIME-INR
INR: 1.3 — ABNORMAL HIGH (ref 0.8–1.2)
Prothrombin Time: 16.2 seconds — ABNORMAL HIGH (ref 11.4–15.2)

## 2021-08-02 LAB — APTT: aPTT: 33 seconds (ref 24–36)

## 2021-08-02 LAB — BRAIN NATRIURETIC PEPTIDE: B Natriuretic Peptide: 36.9 pg/mL (ref 0.0–100.0)

## 2021-08-02 MED ORDER — VANCOMYCIN HCL 1500 MG/300ML IV SOLN
1500.0000 mg | Freq: Once | INTRAVENOUS | Status: AC
  Administered 2021-08-03: 1500 mg via INTRAVENOUS
  Filled 2021-08-02: qty 300

## 2021-08-02 MED ORDER — SODIUM CHLORIDE 0.9 % IV BOLUS (SEPSIS)
1000.0000 mL | Freq: Once | INTRAVENOUS | Status: AC
Start: 2021-08-02 — End: 2021-08-02
  Administered 2021-08-02: 1000 mL via INTRAVENOUS

## 2021-08-02 MED ORDER — SODIUM CHLORIDE 0.9 % IV BOLUS (SEPSIS): 250.0000 mL | Freq: Once | INTRAVENOUS | Status: DC

## 2021-08-02 MED ORDER — SODIUM CHLORIDE 0.9 % IV SOLN: 2.0000 g | Freq: Three times a day (TID) | INTRAVENOUS | Status: DC

## 2021-08-02 MED ORDER — CEFEPIME HCL 2 G IV SOLR
2.0000 g | Freq: Once | INTRAVENOUS | Status: AC
  Administered 2021-08-02: 2 g via INTRAVENOUS
  Filled 2021-08-02: qty 12.5

## 2021-08-02 MED ORDER — SODIUM CHLORIDE 0.9 % IV BOLUS (SEPSIS)
1000.0000 mL | Freq: Once | INTRAVENOUS | Status: AC
  Administered 2021-08-02: 1000 mL via INTRAVENOUS

## 2021-08-02 MED ORDER — IOHEXOL 350 MG/ML SOLN
80.0000 mL | Freq: Once | INTRAVENOUS | Status: AC | PRN
  Administered 2021-08-02: 80 mL via INTRAVENOUS

## 2021-08-02 MED ORDER — NOREPINEPHRINE 4 MG/250ML-% IV SOLN
0.0000 ug/min | INTRAVENOUS | Status: DC
  Administered 2021-08-02: 2 ug/min via INTRAVENOUS
  Filled 2021-08-02: qty 250

## 2021-08-02 MED ORDER — LACTATED RINGERS IV SOLN
INTRAVENOUS | Status: DC
Start: 2021-08-02 — End: 2021-08-03

## 2021-08-02 MED ORDER — LACTATED RINGERS IV BOLUS
250.0000 mL | Freq: Once | INTRAVENOUS | Status: AC
  Administered 2021-08-02: 250 mL via INTRAVENOUS

## 2021-08-02 MED ORDER — ACETAMINOPHEN 650 MG RE SUPP
650.0000 mg | Freq: Once | RECTAL | Status: AC
  Administered 2021-08-03: 650 mg via RECTAL
  Filled 2021-08-02: qty 1

## 2021-08-02 NOTE — ED Notes (Signed)
Critical care at bedside  
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
## 2021-08-02 NOTE — ED Provider Notes (Signed)
[facility] EMERGENCY DEPARTMENT Provider Note   CSN: [id number] Arrival date & time: 07/28/2021  2034     History  Chief Complaint  Patient presents with   Cardiac Arrest    Travis Cantrell is a 61 y.o. male.  HPI    62 year old male with history of MS comes in with chief complaint of cardiac arrest. 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  Patient resides at a [facility] nursing home.  According to EMS, patient was started on UTI treatment recently and was found to be short of breath by nurses today.  Soon after, he went into cardiac arrest.  He received about 15 minutes of CPR and 2 rounds of epi and had ROSC.  Patient is intubated.  I tried to call patient's emergency contact and was able to reach his stepdaughter.  I was given his wife's number, called that number and there was no response.  After a few minutes however, patient's biological daughter presented to the ER.  She informs me that the nursing home had called her about UTI on Wednesday or Thursday and patient has been on antibiotics since then.  They informed her today that he became short of breath and went into cardiac arrest.  She states that although patient is married, he has been separated from his wife for several years.    Home Medications Prior to Admission medications   Medication Sig Start Date End Date Taking? Authorizing Provider  coconut oil OIL Apply 1 Application topically daily. To lower legs   Yes [provider]  collagenase (SANTYL) 250 UNIT/GM ointment Apply 1 Application topically daily. To left foot and right ischium   Yes [provider]  Nutritional Supplements (PROMOD) LIQD Take 30 mLs by mouth 2 (two) times daily.   Yes [provider]  Propylene Glycol (SYSTANE COMPLETE) 0.6 % SOLN Place 2 drops into both eyes in the morning, at noon, in the evening, and at  bedtime.   Yes [provider]  sodium chloride 0.9 % infusion Inject 100 mL/hr into the vein continuous. For 5 days for UTI and use 10 ml intravenously as needed for flush   Yes [provider]  ZINC OXIDE, TOPICAL, 10 % CREA Apply 1 Application topically daily. To left buttocks   Yes [provider]  acetaminophen (TYLENOL) 325 MG tablet Take 650 mg by mouth every 4 (four) hours as needed for mild pain.    [provider]  acetaminophen (TYLENOL) 500 MG tablet Take 1,000 mg by mouth 3 (three) times daily. For seven days.    [provider]  ARIPiprazole (ABILIFY) 5 MG tablet Take 5 mg by mouth daily. 04/19/21   [provider]  atorvastatin (LIPITOR) 10 MG tablet Take 10 mg by mouth daily.    [provider]  baclofen (LIORESAL) 10 MG tablet Take 10 mg by mouth 3 (three) times daily. 04/13/21   [provider]  bisacodyl (DULCOLAX) 5 MG EC tablet Take 1 tablet (5 mg total) by mouth daily as needed for moderate constipation. 03/16/17   Shon Hale, MD  butalbital-acetaminophen-caffeine (FIORICET, ESGIC) 50-325-40 MG tablet Take 1 tablet by mouth every 6 (six) hours as needed for headache or migraine.    [provider]  cefTRIAXone (ROCEPHIN) 1 g SOLR injection Inject 1 g into the vein every 12 (twelve) hours. For 5 days    [provider]  cholecalciferol (VITAMIN D3)  25 MCG (1000 UNIT) tablet Take 4,000 Units by mouth every morning.    [provider]  eszopiclone (LUNESTA) 1 MG TABS tablet Take 1 mg by mouth at bedtime. 04/02/21   [provider]  FLUoxetine (PROZAC) 20 MG capsule TAKE 2 CAPSULES BY MOUTH ONCE DAILY IN THE MORNING AND 1 CAPSULE ONCE DAILY IN THE EVENING Patient taking differently: Take 20-40 mg by mouth See admin instructions. Give 2 capsule one time a day and 1 capsule in the evening 02/27/19   Bedsole, Amy E, MD  ibuprofen (ADVIL) 800 MG tablet TAKE 1 TABLET BY MOUTH EVERY  8 HOURS AS NEEDED Patient not taking: Reported on 08/04/2021 05/01/19   Copland, Frederico Hamman, MD  ketotifen (ZADITOR) 0.025 % ophthalmic solution Place 1 drop into both eyes daily.    [provider]  loratadine (CLARITIN) 10 MG tablet Take 10 mg by mouth daily.    [provider]  Multiple Vitamin (MULTIVITAMIN WITH MINERALS) TABS tablet Take 1 tablet by mouth every morning.    [provider]  NYSTATIN powder APPLY  POWDER TOPICALLY TO RASH 4 TIMES DAILY Patient taking differently: Apply 1 application  topically 4 (four) times daily. 09/11/18   Bedsole, Amy E, MD  ondansetron (ZOFRAN) 4 MG tablet Take 4 mg by mouth every 8 (eight) hours as needed for nausea or vomiting.    [provider]  Oxcarbazepine (TRILEPTAL) 300 MG tablet Take 450 mg by mouth 2 (two) times daily.    [provider]  polyethylene glycol (MIRALAX / GLYCOLAX) 17 g packet Take 17 g by mouth daily as needed for moderate constipation (constipation).    [provider]  tiZANidine (ZANAFLEX) 4 MG tablet Take 1 tablet (4 mg total) by mouth in the morning, at noon, in the evening, and at bedtime. Patient taking differently: Take 8 mg by mouth in the morning, at noon, in the evening, and at bedtime. 08/04/20   Sater, Nanine Means, MD  omeprazole (PRILOSEC) 20 MG capsule Take 1 capsule (20 mg total) by mouth 2 (two) times daily before a meal. Patient not taking: Reported on 05/06/2019 03/28/18 05/06/19  Modena Jansky, MD      Allergies    Baclofen    Review of Systems   Review of Systems  Unable to perform ROS: Patient unresponsive    Physical Exam Updated Vital Signs BP 90/63   Pulse (!) 102   Temp (!) 101.7 F (38.7 C) (Rectal)   Resp (!) 28   SpO2 99%  Physical Exam Vitals and nursing note reviewed.  Constitutional:      Appearance: He is well-developed.     Comments: Unresponsive  HENT:     Head: Atraumatic.  Cardiovascular:     Rate and Rhythm: Tachycardia present.   Pulmonary:     Comments: Patient is intubated Abdominal:     Palpations: Abdomen is soft.  Skin:    General: Skin is warm.     Findings: Bruising and erythema present.     ED Results / Procedures / Treatments   Labs (all labs ordered are listed, but only abnormal results are displayed) Labs Reviewed  COMPREHENSIVE METABOLIC PANEL - Abnormal; Notable for the following components:      Result Value   Sodium 147 (*)    Potassium 3.4 (*)    Chloride 116 (*)    CO2 14 (*)    Glucose, Bld 277 (*)    BUN 26 (*)    Creatinine, Ser  1.42 (*)    Calcium 8.0 (*)    Total Protein 5.5 (*)    Albumin 2.0 (*)    AST 78 (*)    ALT 50 (*)    GFR, Estimated 56 (*)    Anion gap 17 (*)    All other components within normal limits  CBC WITH DIFFERENTIAL/PLATELET - Abnormal; Notable for the following components:   WBC 31.9 (*)    Hemoglobin 12.5 (*)    MCHC 29.3 (*)    RDW 15.8 (*)    Platelets 553 (*)    Neutro Abs 23.6 (*)    Lymphs Abs 6.7 (*)    Monocytes Absolute 1.3 (*)    All other components within normal limits  PROTIME-INR - Abnormal; Notable for the following components:   Prothrombin Time 16.2 (*)    INR 1.3 (*)    All other components within normal limits  D-DIMER, QUANTITATIVE - Abnormal; Notable for the following components:   D-Dimer, Quant >20.00 (*)    All other components within normal limits  I-STAT CHEM 8, ED - Abnormal; Notable for the following components:   Sodium 149 (*)    Chloride 116 (*)    BUN 32 (*)    Glucose, Bld 259 (*)    Calcium, Ion 1.08 (*)    TCO2 17 (*)    All other components within normal limits  I-STAT VENOUS BLOOD GAS, ED - Abnormal; Notable for the following components:   pH, Ven 7.021 (*)    pO2, Ven 173 (*)    Bicarbonate 14.2 (*)    TCO2 16 (*)    Acid-base deficit 17.0 (*)    Sodium 148 (*)    Calcium, Ion 1.10 (*)    All other components within normal limits  I-STAT ARTERIAL BLOOD GAS, ED - Abnormal; Notable for the  following components:   pH, Arterial 7.253 (*)    pO2, Arterial 308 (*)    Bicarbonate 18.5 (*)    TCO2 20 (*)    Acid-base deficit 8.0 (*)    Sodium 148 (*)    Potassium 3.1 (*)    HCT 29.0 (*)    Hemoglobin 9.9 (*)    All other components within normal limits  TROPONIN I (HIGH SENSITIVITY) - Abnormal; Notable for the following components:   Troponin I (High Sensitivity) 591 (*)    All other components within normal limits  RESP PANEL BY RT-PCR (FLU A&B, COVID) ARPGX2  CULTURE, BLOOD (ROUTINE X 2)  CULTURE, BLOOD (ROUTINE X 2)  URINE CULTURE  APTT  BRAIN NATRIURETIC PEPTIDE  LACTIC ACID, PLASMA  LACTIC ACID, PLASMA  URINALYSIS, ROUTINE W REFLEX MICROSCOPIC  TROPONIN I (HIGH SENSITIVITY)    EKG EKG Interpretation  Date/Time:  Monday August 24, 2021 21:47:13 EDT Ventricular Rate:  113 PR Interval:  151 QRS Duration: 102 QT Interval:  363 QTC Calculation: 498 R Axis:   -59 Text Interpretation: Sinus tachycardia Abnormal R-wave progression, late transition Inferior infarct, old Borderline ST elevation, anterolateral leads No acute changes Confirmed by Derwood Kaplan [id number]) on 08-24-2021 11:44:12 PM  Radiology CT Angio Chest PE W and/or Wo Contrast  Result Date: 08-24-2021 CLINICAL DATA:  Pulmonary embolism (PE) suspected, high prob. Post cardiac arrest. EXAM: CT ANGIOGRAPHY CHEST WITH CONTRAST TECHNIQUE: Multidetector CT imaging of the chest was performed using the standard protocol during bolus administration of intravenous contrast. Multiplanar CT image reconstructions and MIPs were obtained to evaluate the vascular anatomy. RADIATION DOSE REDUCTION: This exam was  performed according to the departmental dose-optimization program which includes automated exposure control, adjustment of the mA and/or kV according to patient size and/or use of iterative reconstruction technique. CONTRAST:  1mL OMNIPAQUE IOHEXOL 350 MG/ML SOLN COMPARISON:  None Available. FINDINGS: Cardiovascular:  No filling defects in the pulmonary arteries to suggest pulmonary emboli. Heart is normal size. Aorta is normal caliber. Scattered aortic calcifications. Mediastinum/Nodes: Endotracheal tube tip in the midtrachea. No mediastinal, hilar, or axillary adenopathy. Thyroid unremarkable. Lungs/Pleura: Extensive bilateral airspace disease, most confluent in the lower lobes but also seen in the upper lobes, right greater than left. No effusions. Upper Abdomen: Imaging into the upper abdomen demonstrates no acute findings. NG tube tip is in the proximal to mid stomach. Musculoskeletal: Chest wall soft tissues are unremarkable. No acute bony abnormality. Review of the MIP images confirms the above findings. IMPRESSION: No evidence of pulmonary embolus. Extensive bilateral airspace disease concerning for pneumonia Aortic Atherosclerosis (ICD10-I70.0). Electronically Signed   By: Rolm Baptise M.D.   On: 08/19/2021 23:36   CT Head Wo Contrast  Result Date: 08/04/2021 CLINICAL DATA:  Mental status change, unknown cause post cardiac arrest EXAM: CT HEAD WITHOUT CONTRAST TECHNIQUE: Contiguous axial images were obtained from the base of the skull through the vertex without intravenous contrast. RADIATION DOSE REDUCTION: This exam was performed according to the departmental dose-optimization program which includes automated exposure control, adjustment of the mA and/or kV according to patient size and/or use of iterative reconstruction technique. COMPARISON:  03/22/2018 FINDINGS: Brain: Diffuse cerebral atrophy. No acute intracranial abnormality. Specifically, no hemorrhage, hydrocephalus, mass lesion, acute infarction, or significant intracranial injury. Vascular: No hyperdense vessel or unexpected calcification. Skull: No acute calvarial abnormality. Sinuses/Orbits: No acute findings Other: None IMPRESSION: Diffuse cerebral atrophy.  No acute intracranial abnormality. Electronically Signed   By: Rolm Baptise M.D.   On:  08/09/2021 23:34   DG Chest Port 1 View  Result Date: 08/16/2021 CLINICAL DATA:  Post intubation. EXAM: PORTABLE CHEST 1 VIEW COMPARISON:  Chest x-ray 08/22/2021 FINDINGS: Endotracheal tube tip is now 3.8 cm above the carina. Right-sided central venous catheter tip projects over the SVC. New enteric tube extends below the diaphragm, tip not included on the image. Bilateral airspace disease, right greater than left, has not significantly changed. There is no pleural effusion or pneumothorax. The heart is enlarged, similar to prior study. Osseous structures are stable. IMPRESSION: 1. Endotracheal tube tip is now 3.8 cm above the carina. 2. Stable bilateral airspace disease. Electronically Signed   By: Ronney Asters M.D.   On: 07/28/2021 22:37   DG Chest Port 1 View  Result Date: 07/24/2021 CLINICAL DATA:  Questionable sepsis EXAM: PORTABLE CHEST 1 VIEW COMPARISON:  03/27/2020 FINDINGS: Endotracheal tube is in the right mainstem bronchus. Recommend retracting 4-5 cm for optimal positioning. Heart is normal size. Bilateral perihilar airspace opacities, right greater than left. No effusions or acute bony abnormality. IMPRESSION: Right mainstem intubation.  Recommend retracting 4-5 cm. Bilateral perihilar airspace disease. These results were called by telephone at the time of interpretation on 08/10/2021 at 10:06 pm to provider [facility] , who verbally acknowledged these results. Electronically Signed   By: Rolm Baptise M.D.   On: 07/27/2021 22:08    Procedures .Critical Care  Performed by: Varney Biles, MD Authorized by: Varney Biles, MD   Critical care provider statement:    Critical care time (minutes):  81   Critical care was necessary to treat or prevent imminent or life-threatening deterioration of the following conditions:  Respiratory failure, circulatory failure, shock and sepsis   Critical care was time spent personally by me on the following activities:  Development of treatment plan  with patient or surrogate, discussions with consultants, evaluation of patient's response to treatment, examination of patient, ordering and review of laboratory studies, ordering and review of radiographic studies, ordering and performing treatments and interventions, pulse oximetry, re-evaluation of patient's condition, review of old charts and obtaining history from patient or surrogate     Medications Ordered in ED Medications  lactated ringers infusion ( Intravenous New Bag/Given 08/20/2021 2243)  norepinephrine (LEVOPHED) 4mg  in 231mL (0.016 mg/mL) premix infusion (2 mcg/min Intravenous New Bag/Given 08/04/2021 2241)  ceFEPIme (MAXIPIME) 2 g in sodium chloride 0.9 % 100 mL IVPB (has no administration in time range)  acetaminophen (TYLENOL) suppository 650 mg (has no administration in time range)  sodium chloride 0.9 % bolus 1,000 mL (0 mLs Intravenous Stopped 08/23/2021 2217)    And  sodium chloride 0.9 % bolus 1,000 mL (0 mLs Intravenous Stopped 08/04/2021 2216)  ceFEPIme (MAXIPIME) 2 g in sodium chloride 0.9 % 100 mL IVPB (0 g Intravenous Stopped 08/01/2021 2216)  lactated ringers bolus 250 mL (0 mLs Intravenous Stopped 08/09/2021 2239)  iohexol (OMNIPAQUE) 350 MG/ML injection 80 mL (80 mLs Intravenous Contrast Given 08/22/2021 2312)    ED Course/ Medical Decision Making/ A&P Clinical Course as of 08/11/2021 2344  Mon Aug 02, 2021  2057 Spoke with Dr. Ellyn Hack. [AN]  2105 Spoke with step-daughter, tried to call wife, but there was no response. [AN]    Clinical Course User Index [AN] Varney Biles, MD                           Medical Decision Making Amount and/or Complexity of Data Reviewed Labs: ordered. Radiology: ordered. ECG/medicine tests: ordered.  Risk Prescription drug management. Decision regarding hospitalization.   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 The complaint involves an extensive differential diagnosis and treatment options and also carries with it a high risk of complications and morbidity.    Patient was being treated for UTI.  Suddenly he had shortness of breath followed by cardiac arrest. After 15 minutes of CPR and 2 rounds of epinephrine, paramedics had ROSC. Patient arrives to the ER with a ET tube in place, wide-complex tachycardia, hypotension.  Patient had received Versed, he is otherwise unresponsive.  The differential diagnosis includes : Septic shock leading to cardiomyopathy, and eventually cardiac arrest, PE, MI.   Patient is noted to be in warm shock.  He had nonspecific ST changes and unclear rhythm post ROSC, I did discuss case with Dr. Ellyn Hack -STEMI doc on call.  Patient's repeat EKG is improved and plan is for [place] to treat the underlying cause which appears to be sepsis.   Additional history obtained: Additional history obtained from family and EMS  Records reviewed previous admission documents  Reassessment and review (also see workup area): Lab Tests: I Ordered, and personally interpreted labs.  The pertinent results include:   Patient has significantly elevated white count, hyponatremia, bicarb is low at 14, patient has metabolic acidosis, anion gap of 17. His troponin is elevated at 590 as well.  Central line was placed.  Repeat x-ray reveals no evidence of pneumothorax.   Imaging Studies: I  ordered and independently visualized and interpreted the following imaging X-ray of the chest   which showed intubation in the right mainstem. The interpretation of the imaging was limited to assessing for emergent pathology, for which purpose it was ordered.  Consultations Obtained: I requested consultation with the consultant ICU team , and discussed  findings as well as pertinent plan -they will admit the patient  Medicines  ordered and prescription drug management: Patient has received 30 cc/kg of fluid bolus followed by maintenance fluid. He will receive cefepime and vancomycin for his sepsis Norepinephrine has been initiated.  Sepsis hemodynamic reassessment completed at 11:30 PM.  Family notified with updates.  They are now at the bedside.   Reevaluation of the patient after these medicines showed that the patient    stayed the same   Cardiac Monitoring: The patient was maintained on a cardiac monitor.  I personally viewed and interpreted the cardiac monitor which showed an underlying rhythm of:  sinus tachycardia  Complexity of problems addressed: Patient's presentation is most consistent with  acute presentation with potential threat to life or bodily function During patient's assessment  Disposition: After consideration of the diagnostic results and the patient's response to treatment,  I feel that the patent would benefit from admission   .    CODE STATUS: No CPR Family understands that the situation is guarded.  They will reassess the patient to see if they need to withdraw care and focus on comfort care.  Final Clinical Impression(s) / ED Diagnoses Final diagnoses:  Cardiac arrest ([place])  Septic shock [facility])    Rx / DC Orders ED Discharge Orders     None         Varney Biles, MD 08/13/2021 2352

## 2021-08-02 NOTE — ED Provider Notes (Signed)
.  Central Line  Date/Time: 08/09/2021 10:19 PM  Performed by: Tommie Raymond, MD Authorized by: Derwood Kaplan, MD   Consent:    Consent obtained:  Emergent situation Pre-procedure details:    Indication(s): central venous access and insufficient peripheral access     Skin preparation:  Chlorhexidine Sedation:    Sedation type:  None Anesthesia:    Anesthesia method:  None Procedure details:    Location:  R internal jugular   Patient position:  Reverse Trendelenburg   Procedural supplies:  Triple lumen   Catheter size:  7 Fr   Landmarks identified: yes     Ultrasound guidance: yes     Ultrasound guidance timing: prior to insertion and real time     Sterile ultrasound techniques: Sterile gel and sterile probe covers were used     Number of attempts:  1   Successful placement: yes   Post-procedure details:    Post-procedure:  Line sutured and dressing applied   Assessment:  Blood return through all ports   Procedure completion:  Tolerated well, no immediate complications Ultrasound ED Echo  Date/Time: 07/27/2021 10:21 PM  Performed by: Tommie Raymond, MD Authorized by: Derwood Kaplan, MD   Procedure details:    Indications: cardiac arrest and hypotension     Views: subxiphoid, parasternal long axis view, parasternal short axis view, apical 4 chamber view and IVC view     Images: not archived     Limitations:  Body habitus and increased thoracic air Findings:    Pericardium: no pericardial effusion     Cardiac Activity: hyperdynamic     LV Function: normal (>50% EF)     RV Diameter: normal     IVC: collapsed   Impression:    Impression: high-output state       Tommie Raymond, MD 08/21/2021 2222    Derwood Kaplan, MD 08/03/21 1738

## 2021-08-02 NOTE — Sepsis Progress Note (Signed)
Elink following Code Sepsis. 

## 2021-08-02 NOTE — Progress Notes (Signed)
Pharmacy Antibiotic Note  Travis Cantrell is a 61 y.o. male admitted on 08/06/2021 with code sepsis -thought to be due to UTI.  Pharmacy has been consulted for Cefepime dosing.  Plan: Cefepime 2gm IV q8h Will f/u micro data, renal function, and pt's clinical condition     Temp (24hrs), Avg:101.7 F (38.7 C), Min:101.7 F (38.7 C), Max:101.7 F (38.7 C)  Recent Labs  Lab 08/18/2021 2102 08/21/2021 2203  WBC 31.9*  --   CREATININE 1.42* 1.10    CrCl cannot be calculated (Unknown ideal weight.).    Allergies  Allergen Reactions   Baclofen Diarrhea    Antimicrobials this admission: 7/10 Cefepime >>   Microbiology results: 7/10 BCx:   Thank you for allowing pharmacy to be a part of this patient's care.  Christoper Fabian, PharmD, BCPS Please see amion for complete clinical pharmacist phone list 08/23/2021 10:33 PM
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
## 2021-08-03 ENCOUNTER — Encounter (HOSPITAL_COMMUNITY): Payer: Self-pay | Admitting: Critical Care Medicine

## 2021-08-03 ENCOUNTER — Inpatient Hospital Stay (HOSPITAL_COMMUNITY): Payer: Medicare (Managed Care)

## 2021-08-03 DIAGNOSIS — A419 Sepsis, unspecified organism: Secondary | ICD-10-CM | POA: Diagnosis present

## 2021-08-03 DIAGNOSIS — J9601 Acute respiratory failure with hypoxia: Secondary | ICD-10-CM

## 2021-08-03 DIAGNOSIS — T83518A Infection and inflammatory reaction due to other urinary catheter, initial encounter: Secondary | ICD-10-CM | POA: Diagnosis present

## 2021-08-03 DIAGNOSIS — G9340 Encephalopathy, unspecified: Secondary | ICD-10-CM | POA: Diagnosis present

## 2021-08-03 DIAGNOSIS — R570 Cardiogenic shock: Secondary | ICD-10-CM | POA: Diagnosis present

## 2021-08-03 DIAGNOSIS — I469 Cardiac arrest, cause unspecified: Principal | ICD-10-CM | POA: Diagnosis present

## 2021-08-03 DIAGNOSIS — G8250 Quadriplegia, unspecified: Secondary | ICD-10-CM | POA: Diagnosis present

## 2021-08-03 DIAGNOSIS — I5181 Takotsubo syndrome: Secondary | ICD-10-CM

## 2021-08-03 DIAGNOSIS — E870 Hyperosmolality and hypernatremia: Secondary | ICD-10-CM | POA: Diagnosis present

## 2021-08-03 DIAGNOSIS — Y846 Urinary catheterization as the cause of abnormal reaction of the patient, or of later complication, without mention of misadventure at the time of the procedure: Secondary | ICD-10-CM | POA: Diagnosis present

## 2021-08-03 DIAGNOSIS — D689 Coagulation defect, unspecified: Secondary | ICD-10-CM | POA: Diagnosis present

## 2021-08-03 DIAGNOSIS — J690 Pneumonitis due to inhalation of food and vomit: Secondary | ICD-10-CM | POA: Diagnosis present

## 2021-08-03 DIAGNOSIS — L89153 Pressure ulcer of sacral region, stage 3: Secondary | ICD-10-CM | POA: Diagnosis present

## 2021-08-03 DIAGNOSIS — F32A Depression, unspecified: Secondary | ICD-10-CM | POA: Diagnosis present

## 2021-08-03 DIAGNOSIS — N390 Urinary tract infection, site not specified: Secondary | ICD-10-CM | POA: Diagnosis present

## 2021-08-03 DIAGNOSIS — D649 Anemia, unspecified: Secondary | ICD-10-CM | POA: Diagnosis present

## 2021-08-03 DIAGNOSIS — Z20822 Contact with and (suspected) exposure to covid-19: Secondary | ICD-10-CM | POA: Diagnosis present

## 2021-08-03 DIAGNOSIS — N179 Acute kidney failure, unspecified: Secondary | ICD-10-CM | POA: Diagnosis present

## 2021-08-03 DIAGNOSIS — E43 Unspecified severe protein-calorie malnutrition: Secondary | ICD-10-CM | POA: Diagnosis present

## 2021-08-03 DIAGNOSIS — E872 Acidosis, unspecified: Secondary | ICD-10-CM | POA: Diagnosis present

## 2021-08-03 DIAGNOSIS — R6521 Severe sepsis with septic shock: Secondary | ICD-10-CM | POA: Diagnosis present

## 2021-08-03 DIAGNOSIS — Z66 Do not resuscitate: Secondary | ICD-10-CM | POA: Diagnosis present

## 2021-08-03 DIAGNOSIS — R569 Unspecified convulsions: Secondary | ICD-10-CM | POA: Diagnosis not present

## 2021-08-03 DIAGNOSIS — Z515 Encounter for palliative care: Secondary | ICD-10-CM | POA: Diagnosis not present

## 2021-08-03 DIAGNOSIS — I10 Essential (primary) hypertension: Secondary | ICD-10-CM | POA: Diagnosis present

## 2021-08-03 DIAGNOSIS — L89893 Pressure ulcer of other site, stage 3: Secondary | ICD-10-CM | POA: Diagnosis present

## 2021-08-03 LAB — ECHOCARDIOGRAM COMPLETE
AR max vel: 3.57 cm2
AV Area VTI: 3.62 cm2
AV Area mean vel: 3.63 cm2
AV Mean grad: 4 mmHg
AV Peak grad: 7.2 mmHg
Ao pk vel: 1.34 m/s
Area-P 1/2: 4.8 cm2
Calc EF: 38.3 %
Height: 70 in
S' Lateral: 3.4 cm
Single Plane A2C EF: 38.3 %
Single Plane A4C EF: 39.4 %
Weight: 2634.94 oz

## 2021-08-03 LAB — URINALYSIS, ROUTINE W REFLEX MICROSCOPIC
Glucose, UA: NEGATIVE mg/dL
Ketones, ur: NEGATIVE mg/dL
Nitrite: POSITIVE — AB
Protein, ur: 100 mg/dL — AB
Specific Gravity, Urine: <1.005 — ABNORMAL LOW (ref 1.005–1.030)
pH: 5.5 (ref 5.0–8.0)

## 2021-08-03 LAB — BLOOD GAS, VENOUS
Acid-base deficit: 3.2 mmol/L — ABNORMAL HIGH (ref 0.0–2.0)
Bicarbonate: 22.7 mmol/L (ref 20.0–28.0)
O2 Saturation: 89 %
Patient temperature: 37
pCO2, Ven: 43 mmHg — ABNORMAL LOW (ref 44–60)
pH, Ven: 7.33 (ref 7.25–7.43)
pO2, Ven: 59 mmHg — ABNORMAL HIGH (ref 32–45)

## 2021-08-03 LAB — HEMOGLOBIN A1C
Hgb A1c MFr Bld: 5.9 % — ABNORMAL HIGH (ref 4.8–5.6)
Mean Plasma Glucose: 122.63 mg/dL

## 2021-08-03 LAB — COMPREHENSIVE METABOLIC PANEL
ALT: 57 U/L — ABNORMAL HIGH (ref 0–44)
AST: 63 U/L — ABNORMAL HIGH (ref 15–41)
Albumin: 1.8 g/dL — ABNORMAL LOW (ref 3.5–5.0)
Alkaline Phosphatase: 75 U/L (ref 38–126)
Anion gap: 12 (ref 5–15)
BUN: 23 mg/dL (ref 8–23)
CO2: 18 mmol/L — ABNORMAL LOW (ref 22–32)
Calcium: 7.8 mg/dL — ABNORMAL LOW (ref 8.9–10.3)
Chloride: 118 mmol/L — ABNORMAL HIGH (ref 98–111)
Creatinine, Ser: 1.26 mg/dL — ABNORMAL HIGH (ref 0.61–1.24)
GFR, Estimated: >60 mL/min (ref >60)
Glucose, Bld: 206 mg/dL — ABNORMAL HIGH (ref 70–99)
Potassium: 3.9 mmol/L (ref 3.5–5.1)
Sodium: 148 mmol/L — ABNORMAL HIGH (ref 135–145)
Total Bilirubin: 0.6 mg/dL (ref 0.3–1.2)
Total Protein: 5.5 g/dL — ABNORMAL LOW (ref 6.5–8.1)

## 2021-08-03 LAB — CBC
HCT: 39.6 % (ref 39.0–52.0)
Hemoglobin: 12 g/dL — ABNORMAL LOW (ref 13.0–17.0)
MCH: 27.8 pg (ref 26.0–34.0)
MCHC: 30.3 g/dL (ref 30.0–36.0)
MCV: 91.9 fL (ref 80.0–100.0)
Platelets: 390 10*3/uL (ref 150–400)
RBC: 4.31 MIL/uL (ref 4.22–5.81)
RDW: 15.8 % — ABNORMAL HIGH (ref 11.5–15.5)
WBC: 35.6 10*3/uL — ABNORMAL HIGH (ref 4.0–10.5)
nRBC: 0 % (ref 0.0–0.2)

## 2021-08-03 LAB — PHOSPHORUS: Phosphorus: 3.2 mg/dL (ref 2.5–4.6)

## 2021-08-03 LAB — URINALYSIS, MICROSCOPIC (REFLEX)
RBC / HPF: >50 RBC/hpf (ref 0–5)
WBC, UA: >50 WBC/hpf (ref 0–5)

## 2021-08-03 LAB — GLUCOSE, CAPILLARY
Glucose-Capillary: 198 mg/dL — ABNORMAL HIGH (ref 70–99)
Glucose-Capillary: 181 mg/dL — ABNORMAL HIGH (ref 70–99)
Glucose-Capillary: 213 mg/dL — ABNORMAL HIGH (ref 70–99)
Glucose-Capillary: 172 mg/dL — ABNORMAL HIGH (ref 70–99)
Glucose-Capillary: 272 mg/dL — ABNORMAL HIGH (ref 70–99)
Glucose-Capillary: 332 mg/dL — ABNORMAL HIGH (ref 70–99)

## 2021-08-03 LAB — BASIC METABOLIC PANEL
Anion gap: 10 (ref 5–15)
BUN: 28 mg/dL — ABNORMAL HIGH (ref 8–23)
CO2: 25 mmol/L (ref 22–32)
Calcium: 8.2 mg/dL — ABNORMAL LOW (ref 8.9–10.3)
Chloride: 114 mmol/L — ABNORMAL HIGH (ref 98–111)
Creatinine, Ser: 1.1 mg/dL (ref 0.61–1.24)
GFR, Estimated: >60 mL/min (ref >60)
Glucose, Bld: 368 mg/dL — ABNORMAL HIGH (ref 70–99)
Potassium: 3.1 mmol/L — ABNORMAL LOW (ref 3.5–5.1)
Sodium: 149 mmol/L — ABNORMAL HIGH (ref 135–145)

## 2021-08-03 LAB — MAGNESIUM: Magnesium: 1.4 mg/dL — ABNORMAL LOW (ref 1.7–2.4)

## 2021-08-03 LAB — TROPONIN I (HIGH SENSITIVITY)
Troponin I (High Sensitivity): 2597 ng/L (ref <18)
Troponin I (High Sensitivity): 3397 ng/L (ref <18)
Troponin I (High Sensitivity): 5753 ng/L (ref <18)

## 2021-08-03 LAB — HIV ANTIBODY (ROUTINE TESTING W REFLEX): HIV Screen 4th Generation wRfx: NONREACTIVE

## 2021-08-03 LAB — C-REACTIVE PROTEIN: CRP: 30 mg/dL — ABNORMAL HIGH (ref <1.0)

## 2021-08-03 LAB — LACTIC ACID, PLASMA
Lactic Acid, Venous: 2.2 mmol/L (ref 0.5–1.9)
Lactic Acid, Venous: 1.9 mmol/L (ref 0.5–1.9)

## 2021-08-03 LAB — RESP PANEL BY RT-PCR (FLU A&B, COVID) ARPGX2
Influenza A by PCR: NEGATIVE
Influenza B by PCR: NEGATIVE
SARS Coronavirus 2 by RT PCR: NEGATIVE

## 2021-08-03 LAB — MRSA NEXT GEN BY PCR, NASAL: MRSA by PCR Next Gen: DETECTED — AB

## 2021-08-03 MED ORDER — INSULIN ASPART 100 UNIT/ML IJ SOLN
2.0000 [IU] | INTRAMUSCULAR | Status: DC
  Administered 2021-08-03: 6 [IU] via SUBCUTANEOUS
  Administered 2021-08-03: 4 [IU] via SUBCUTANEOUS

## 2021-08-03 MED ORDER — POTASSIUM CHLORIDE 20 MEQ PO PACK
60.0000 meq | PACK | Freq: Once | ORAL | Status: AC
Start: 2021-08-04 — End: 2021-08-04
  Administered 2021-08-04: 60 meq
  Filled 2021-08-03: qty 3

## 2021-08-03 MED ORDER — POLYETHYLENE GLYCOL 3350 17 G PO PACK
17.0000 g | PACK | Freq: Every day | ORAL | Status: DC
  Administered 2021-08-03 – 2021-08-04 (×2): 17 g
  Filled 2021-08-03 (×2): qty 1

## 2021-08-03 MED ORDER — FENTANYL CITRATE PF 50 MCG/ML IJ SOSY
50.0000 ug | PREFILLED_SYRINGE | INTRAMUSCULAR | Status: DC | PRN
  Administered 2021-08-03 (×2): 50 ug via INTRAVENOUS
  Filled 2021-08-03 (×2): qty 1

## 2021-08-03 MED ORDER — ORAL CARE MOUTH RINSE: 15.0000 mL | OROMUCOSAL | Status: DC | PRN

## 2021-08-03 MED ORDER — PROSOURCE TF PO LIQD
45.0000 mL | Freq: Two times a day (BID) | ORAL | Status: DC
  Administered 2021-08-03 (×2): 45 mL
  Filled 2021-08-03 (×2): qty 45

## 2021-08-03 MED ORDER — FUROSEMIDE 10 MG/ML IJ SOLN
40.0000 mg | Freq: Once | INTRAMUSCULAR | Status: AC
Start: 2021-08-03 — End: 2021-08-03
  Administered 2021-08-03: 40 mg via INTRAVENOUS
  Filled 2021-08-03: qty 4

## 2021-08-03 MED ORDER — POTASSIUM CHLORIDE 20 MEQ PO PACK
40.0000 meq | PACK | Freq: Once | ORAL | Status: AC
  Administered 2021-08-03: 40 meq
  Filled 2021-08-03: qty 2

## 2021-08-03 MED ORDER — HEPARIN SODIUM (PORCINE) 5000 UNIT/ML IJ SOLN
5000.0000 [IU] | Freq: Three times a day (TID) | INTRAMUSCULAR | Status: DC
  Administered 2021-08-03 – 2021-08-05 (×7): 5000 [IU] via SUBCUTANEOUS
  Filled 2021-08-03 (×7): qty 1

## 2021-08-03 MED ORDER — ORAL CARE MOUTH RINSE
15.0000 mL | OROMUCOSAL | Status: DC
  Administered 2021-08-03 – 2021-08-05 (×26): 15 mL via OROMUCOSAL

## 2021-08-03 MED ORDER — ZINC SULFATE 220 (50 ZN) MG PO CAPS
220.0000 mg | ORAL_CAPSULE | Freq: Every day | ORAL | Status: DC
  Administered 2021-08-03 – 2021-08-04 (×2): 220 mg
  Filled 2021-08-03 (×2): qty 1

## 2021-08-03 MED ORDER — STERILE WATER FOR INJECTION IV SOLN
INTRAVENOUS | Status: DC
  Filled 2021-08-03 (×2): qty 1000

## 2021-08-03 MED ORDER — DOCUSATE SODIUM 100 MG PO CAPS: 100.0000 mg | ORAL_CAPSULE | Freq: Two times a day (BID) | ORAL | Status: DC | PRN

## 2021-08-03 MED ORDER — ASCORBIC ACID 500 MG PO TABS
250.0000 mg | ORAL_TABLET | Freq: Two times a day (BID) | ORAL | Status: DC
  Administered 2021-08-03 – 2021-08-04 (×3): 250 mg
  Filled 2021-08-03 (×3): qty 1

## 2021-08-03 MED ORDER — DOCUSATE SODIUM 50 MG/5ML PO LIQD
100.0000 mg | Freq: Two times a day (BID) | ORAL | Status: DC
  Administered 2021-08-03 – 2021-08-04 (×4): 100 mg
  Filled 2021-08-03 (×4): qty 10

## 2021-08-03 MED ORDER — SODIUM CHLORIDE 0.9 % IV SOLN: INTRAVENOUS | Status: DC | PRN

## 2021-08-03 MED ORDER — OXCARBAZEPINE 150 MG PO TABS
450.0000 mg | ORAL_TABLET | Freq: Two times a day (BID) | ORAL | Status: DC
  Administered 2021-08-03 – 2021-08-04 (×4): 450 mg
  Filled 2021-08-03 (×5): qty 1

## 2021-08-03 MED ORDER — VITAL 1.5 CAL PO LIQD
1000.0000 mL | ORAL | Status: DC
  Administered 2021-08-03 – 2021-08-04 (×2): 1000 mL

## 2021-08-03 MED ORDER — INSULIN ASPART 100 UNIT/ML IJ SOLN
0.0000 [IU] | INTRAMUSCULAR | Status: DC
  Administered 2021-08-03: 2 [IU] via SUBCUTANEOUS
  Administered 2021-08-03: 7 [IU] via SUBCUTANEOUS
  Administered 2021-08-03: 5 [IU] via SUBCUTANEOUS
  Administered 2021-08-04: 3 [IU] via SUBCUTANEOUS
  Administered 2021-08-04: 5 [IU] via SUBCUTANEOUS
  Administered 2021-08-04: 7 [IU] via SUBCUTANEOUS

## 2021-08-03 MED ORDER — POLYETHYLENE GLYCOL 3350 17 G PO PACK: 17.0000 g | PACK | Freq: Every day | ORAL | Status: DC | PRN

## 2021-08-03 MED ORDER — MAGNESIUM SULFATE 4 GM/100ML IV SOLN
4.0000 g | Freq: Once | INTRAVENOUS | Status: AC
  Administered 2021-08-03: 4 g via INTRAVENOUS
  Filled 2021-08-03: qty 100

## 2021-08-03 MED ORDER — SODIUM CHLORIDE 0.9 % IV SOLN
1.0000 g | Freq: Three times a day (TID) | INTRAVENOUS | Status: DC
  Administered 2021-08-03 – 2021-08-05 (×8): 1 g via INTRAVENOUS
  Filled 2021-08-03 (×8): qty 20

## 2021-08-03 MED ORDER — VITAL HIGH PROTEIN PO LIQD
1000.0000 mL | ORAL | Status: DC
  Administered 2021-08-03: 1000 mL

## 2021-08-03 MED ORDER — ALBUTEROL SULFATE (2.5 MG/3ML) 0.083% IN NEBU
2.5000 mg | INHALATION_SOLUTION | RESPIRATORY_TRACT | Status: DC | PRN
  Administered 2021-08-03: 2.5 mg via RESPIRATORY_TRACT
  Filled 2021-08-03: qty 3

## 2021-08-03 MED ORDER — PROSOURCE TF PO LIQD
45.0000 mL | Freq: Four times a day (QID) | ORAL | Status: DC
  Administered 2021-08-03 – 2021-08-04 (×6): 45 mL
  Filled 2021-08-03 (×6): qty 45

## 2021-08-03 MED ORDER — CHLORHEXIDINE GLUCONATE CLOTH 2 % EX PADS
6.0000 | MEDICATED_PAD | Freq: Every day | CUTANEOUS | Status: DC
  Administered 2021-08-03 – 2021-08-05 (×4): 6 via TOPICAL

## 2021-08-03 MED ORDER — PANTOPRAZOLE 2 MG/ML SUSPENSION
40.0000 mg | Freq: Every day | ORAL | Status: DC
  Administered 2021-08-03 – 2021-08-04 (×2): 40 mg
  Filled 2021-08-03 (×2): qty 20

## 2021-08-03 MED ORDER — FENTANYL CITRATE PF 50 MCG/ML IJ SOSY: 50.0000 ug | PREFILLED_SYRINGE | INTRAMUSCULAR | Status: DC | PRN

## 2021-08-03 MED ORDER — FREE WATER
200.0000 mL | Status: DC
  Administered 2021-08-04 (×2): 200 mL

## 2021-08-03 MED ORDER — LABETALOL HCL 5 MG/ML IV SOLN: 10.0000 mg | INTRAVENOUS | Status: DC | PRN

## 2021-08-03 MED ORDER — PERFLUTREN LIPID MICROSPHERE
1.0000 mL | INTRAVENOUS | Status: AC | PRN
  Administered 2021-08-03: 2 mL via INTRAVENOUS

## 2021-08-03 MED ORDER — MUPIROCIN 2 % EX OINT
1.0000 | TOPICAL_OINTMENT | Freq: Two times a day (BID) | CUTANEOUS | Status: DC
  Administered 2021-08-03 – 2021-08-04 (×4): 1 via NASAL
  Filled 2021-08-03: qty 22

## 2021-08-03 NOTE — Consult Note (Signed)
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 Chronic, nonhealing. Intertriginous dermatitis to the bilateral antecubital spaces related to chronic moisture.  ICD-10 CM Codes for Irritant Dermatitis L30.4  - Erythema intertrigo. Also used for abrasion of the hand, chafing of the skin, dermatitis due to sweating and friction, friction dermatitis, friction eczema, and genital/thigh intertrigo.   Wound type:Pressure, trauma Pressure Injury POA: Yes Measurement: Stage 3 PI to sacrum:4cm x 4cm x 2cm with undermining from 6-12 o'clock, greatest depth at 10 o'clock, 4cm. Red, moist, small to moderate serous drainage. Stage 3 PI to right ischial tuberosity x2: 2cm x 1cm x 0.2cm and 1cm x 3cm x 0.2cm (pink, moist, scant serous exudate) Numerous full and partial thickness wounds to the bilateral LEs: See Nursing Flow Sheet. Deepest lesions are on bilateral lateral 5th digits where circular, punctate (2cm round, x 0.4cm ulcerations) are present. Yellow wound bed, small to moderate exudate.  Wound bed: As noted above Drainage (amount, consistency, odor) As noted above Periwound: intact  Dressing procedure/placement/frequency: Patient is intubated and in an ICU at this time. He is on a mattress replacement with low air loss feature and is being turned and repositioned per house protocol. The [facility] elevation is at a 30 degree angle for both tube feedings and respiratory demand at this time. This increases pressure on the ischial tuberosities and repositioning is in place to do the best possible offloading despite these challenges. The bilateral feet are with foot drop and contractures as are the bilateral arms. The foot contractures would make application and use of pressure redistribution heel boots challenging, so they are not ordered today. Pressure redistribution to these areas will  instead be accomplished using pillows to float heels. Intertriginous areas at the antecubital spaces are with partial thickness skin loss (erythema intertrigo). This will be addresses with antimicrobial moisture wicking textile, Arneta Cliche Hart [place] # [phone]) applied daily and changed PRN)  The sacral and right IT pressure injuries (Stage 3) will be addressed separately, the sacral wounds are shallow and will be cleansed, covered with antimicrobial nonadherent gauze (xeroform, Lawson # [id number]) and secured with silicone foam. The right IT wound, with its depth and undermining will be loosely filled with saline moistened gauze twice daily and changed PRN soiling. It will be covered with dry gauze and secured with silicone foam. The bilateral LE chronic wounds, including those at the bilateral lateral 5th digits, will be cleansed daily and covered with xeroform gauze, then topped with dry guaze and secured with silicone foam. It is likely that the pressure injury wounds and even the chronic lower extremity wounds are not healable and are instead considered maintenance wounds. Care will be taken to decrease negative progression (enlargement) and infection, but it is acknowledged that the numerous comorbid conditions may prevent positive outcomes.  POC discussed at Bedside with outgoing shift RN as well as incoming shift RNs, C. Prewett and S. Elgie Congo.  WOC nursing team will not follow, but will remain available to this patient, the nursing and medical teams.  Please re-consult if needed.  Thank you for inviting [place] to participate in this patient's Plan of Care.  Ladona Mow, MSN, RN, CNS, GNP, Leda Min, [employer], [employer] phone:  [phone]
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
## 2021-08-03 NOTE — Progress Notes (Signed)
[facility] ADULT ICU REPLACEMENT PROTOCOL   The patient does apply for the [facility] Adult ICU Electrolyte Replacment Protocol based on the criteria listed below:   1.Exclusion criteria: TCTS patients, ECMO patients, and Dialysis patients 2. Is GFR >/= 30 ml/min? Yes.    Patient's GFR today is >60 3. Is SCr </= 2? Yes.   Patient's SCr is 1.26 mg/dL 4. Did SCr increase >/= 0.5 in 24 hours? No. 5.Pt's weight >40kg  Yes.   6. Abnormal electrolyte(s): Mag 1.4  7. Electrolytes replaced per protocol 8.  Call MD STAT for K+ </= 2.5, Phos </= 1, or Mag </= 1 Physician:  Reyne Dumas 08/03/2021 5:50 AM

## 2021-08-03 NOTE — ED Notes (Signed)
The lab states they never received a lactic acid on this patient. This RN has drawn one and sent it to the lab.

## 2021-08-03 NOTE — Assessment & Plan Note (Signed)
Primary progressive multiple sclerosis.  Patient nursing home bound dependent on full care but able to talk. Presently on no active treatment.

## 2021-08-03 NOTE — Progress Notes (Addendum)
eLink Physician-Brief Progress Note Patient Name: Travis Cantrell DOB: 11-01-1960 MRN: [id number]   Date of Service  08/03/2021  HPI/Events of Note  Patient with acute urinary retention, > 1000 ml of urine in the bladder.  eICU Interventions  Change supra-pubic catheter.        Thomasene Lot Helon Wisinski 08/03/2021, 2:49 AM

## 2021-08-03 NOTE — Progress Notes (Signed)
eLink Physician-Brief Progress Note Patient Name: Travis Cantrell DOB: 10/29/1960 MRN: [id number]   Date of Service  08/03/2021  HPI/Events of Note  Patient with advanced MS with quadriparesis admitted from skilled facility where he went into respiratory arrest followed by cardiac arrest and received 15 minutes of CPR / ACLS protocol before ROSC, he is intubated and mechanically ventilated at this point.  eICU Interventions  New Patient Evaluation.        Thomasene Lot Derk Doubek 08/03/2021, 3:06 AM

## 2021-08-03 NOTE — Sepsis Progress Note (Signed)
Spoke with Bedside RN, looks like first lactic acid was never collected/ sent to lab.   Lactic acid collected at 0058 & sent to lab. 2nd LA timed for 0258

## 2021-08-03 NOTE — Assessment & Plan Note (Signed)
Problems with aspiration noted in past. Likely sign of worsening disease Family realistic about outcome.  -DNR. -Likely to progress to comfort care if no substantive improvement

## 2021-08-03 NOTE — Progress Notes (Signed)
eLink Physician-Brief Progress Note Patient Name: Travis Cantrell DOB: November 25, 1960 MRN: [id number]   Date of Service  08/03/2021  HPI/Events of Note  Multiple issues: 1. Hypokalemia - K+ = 3.1 and Creatinine = 1.10. 2. Hypernatremia - Na+ = 149.  eICU Interventions  Plan: Replace K+. Free water 200 mL per tube Q 4 hours. Continue to trend Na+ and K+.     Intervention Category Major Interventions: Electrolyte abnormality - evaluation and management  Bryah [place] Eugene 08/03/2021, 11:19 PM

## 2021-08-03 NOTE — Progress Notes (Signed)
   08/03/21 1310  Clinical Encounter Type  Visited With Health care provider;Family [name] D. Rollene Rotunda, PA;Charles R. Prewett, RN; Sophia A. Lorenz Coaster, RN; Wife, Daughter,)  Visit Type Critical Care;Initial  Referral From Physician [name] D. Rollene Rotunda, [place])  Consult/Referral To Chaplain Albertina Parr Morene Crocker)  Recommendations Advance [place]ove Assessment  Spiritual Encounters  Spiritual Needs Emotional;Grief support   Consulted with care team regarding [place] and Living Will being in place. Family implies that [place] Directive may have been in place, but no evidence confirming existence at this time. Nurse also checked with care facility that Mr. Malveaux was transferred from, and they have no records of Advance Medical Directive being on file.  Chaplain introduced [place] available to support their family at this time.  Chaplain met with family members of [place] that were present at this time, in Family waiting area to discuss the use of Advance Medical Directive: [place] Will in situations where patient's are not able to communicate their wishes for themselves.   Chaplain explained that in times that Advance Directive is not available that a patient's family and physicians can make decisions for the patient, but that the making of such decisions were not warranted at this time. Chaplain also expressed that if there comes a time when transitioning to comfort care becomes an issue, and the family is called upon to make such decisions that hopefully they may consider, and come to consensus to, what Mr. Seckman would want for himself, and that they would make decisions in a way that would honor his wishes.  Family was encouraged to reach out to the Spiritual Care Team and/or medical staff at any time should they have questions. Chaplain remained available to answer further questions family members may have.  [address] [place], [place]. [place]., [phone].

## 2021-08-03 NOTE — Assessment & Plan Note (Signed)
Due to aspiration event. Improving oxygenation.  -SBT tomorrow morning.  Mental status will likely dictate timing of extubation.

## 2021-08-03 NOTE — Progress Notes (Signed)
Pharmacy Antibiotic Note  Travis Cantrell is a 61 y.o. male admitted on 07/31/2021 with code sepsis -thought to be due to UTI.  Pharmacy has been consulted for meropenem dosing.  Plan: Meropenem 1gm IV q8h Will f/u micro data, renal function, and pt's clinical condition  Height: 5\' 10"  (177.8 cm) Weight: 79.4 kg (175 lb) IBW/kg (Calculated) : 73  Temp (24hrs), Avg:101.7 F (38.7 C), Min:101.7 F (38.7 C), Max:101.7 F (38.7 C)  Recent Labs  Lab 08/20/2021 2102 08/20/2021 2203  WBC 31.9*  --   CREATININE 1.42* 1.10     Estimated Creatinine Clearance: 72.8 mL/min (by C-G formula based on SCr of 1.1 mg/dL).    Allergies  Allergen Reactions   Baclofen Diarrhea    Antimicrobials this admission: 7/10 Cefepime x1 7/10 meropenem >>  Microbiology results: 7/10 BCx:   2204, PharmD Clinical Pharmacist 08/03/2021 12:59 AM Please check AMION for all [facility] Pharmacy numbers

## 2021-08-03 NOTE — ED Notes (Signed)
Report given to Jeannene Patella RN, 2H

## 2021-08-03 NOTE — Progress Notes (Signed)
  Echocardiogram 2D Echocardiogram with contrast has been performed.  Travis Cantrell F 08/03/2021, 9:20 AM

## 2021-08-03 NOTE — Assessment & Plan Note (Deleted)
Presumed due to to respiratory arrest following aspiration event. Not at neurological baseline. -Observe off sedation for 48 hours for signs of neurological recovery. 
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
## 2021-08-03 NOTE — Sepsis Progress Note (Addendum)
Notified bedside nurse of need to draw lactic acid. Per chart review LA has been collected @ 2102 but it has not resulted. Asked Bedside RN to call lab

## 2021-08-03 NOTE — Assessment & Plan Note (Signed)
Echocardiogram shows typical apical ballooning syndrome with basilar hypokinesis consistent with stress cardiomyopathy Family not on vasopressors.  No clear signs of decompensated heart failure.  -Trial of diuresis as chest x-ray suggests some degree of pulmonary edema.

## 2021-08-03 NOTE — Assessment & Plan Note (Signed)
Presumed due to to respiratory arrest following aspiration event. Not at neurological baseline. -Observe off sedation for 48 hours for signs of neurological recovery.

## 2021-08-04 ENCOUNTER — Inpatient Hospital Stay (HOSPITAL_COMMUNITY): Payer: Medicare (Managed Care)

## 2021-08-04 DIAGNOSIS — I469 Cardiac arrest, cause unspecified: Secondary | ICD-10-CM | POA: Diagnosis not present

## 2021-08-04 DIAGNOSIS — R569 Unspecified convulsions: Secondary | ICD-10-CM | POA: Diagnosis not present

## 2021-08-04 LAB — CBC
HCT: 38.4 % — ABNORMAL LOW (ref 39.0–52.0)
Hemoglobin: 12 g/dL — ABNORMAL LOW (ref 13.0–17.0)
MCH: 28 pg (ref 26.0–34.0)
MCHC: 31.3 g/dL (ref 30.0–36.0)
MCV: 89.7 fL (ref 80.0–100.0)
Platelets: 367 10*3/uL (ref 150–400)
RBC: 4.28 MIL/uL (ref 4.22–5.81)
RDW: 16.2 % — ABNORMAL HIGH (ref 11.5–15.5)
WBC: 22.4 10*3/uL — ABNORMAL HIGH (ref 4.0–10.5)
nRBC: 0 % (ref 0.0–0.2)

## 2021-08-04 LAB — BASIC METABOLIC PANEL
Anion gap: 6 (ref 5–15)
Anion gap: 8 (ref 5–15)
BUN: 28 mg/dL — ABNORMAL HIGH (ref 8–23)
BUN: 28 mg/dL — ABNORMAL HIGH (ref 8–23)
CO2: 27 mmol/L (ref 22–32)
CO2: 26 mmol/L (ref 22–32)
Calcium: 8.3 mg/dL — ABNORMAL LOW (ref 8.9–10.3)
Calcium: 8.3 mg/dL — ABNORMAL LOW (ref 8.9–10.3)
Chloride: 117 mmol/L — ABNORMAL HIGH (ref 98–111)
Chloride: 115 mmol/L — ABNORMAL HIGH (ref 98–111)
Creatinine, Ser: 0.94 mg/dL (ref 0.61–1.24)
Creatinine, Ser: 0.86 mg/dL (ref 0.61–1.24)
GFR, Estimated: >60 mL/min (ref >60)
GFR, Estimated: >60 mL/min (ref >60)
Glucose, Bld: 299 mg/dL — ABNORMAL HIGH (ref 70–99)
Glucose, Bld: 328 mg/dL — ABNORMAL HIGH (ref 70–99)
Potassium: 3.3 mmol/L — ABNORMAL LOW (ref 3.5–5.1)
Potassium: 4.6 mmol/L (ref 3.5–5.1)
Sodium: 150 mmol/L — ABNORMAL HIGH (ref 135–145)
Sodium: 149 mmol/L — ABNORMAL HIGH (ref 135–145)

## 2021-08-04 LAB — POCT I-STAT 7, (LYTES, BLD GAS, ICA,H+H)
Acid-Base Excess: 2 mmol/L (ref 0.0–2.0)
Bicarbonate: 26.2 mmol/L (ref 20.0–28.0)
Calcium, Ion: 1.28 mmol/L (ref 1.15–1.40)
HCT: 34 % — ABNORMAL LOW (ref 39.0–52.0)
Hemoglobin: 11.6 g/dL — ABNORMAL LOW (ref 13.0–17.0)
O2 Saturation: 99 %
Patient temperature: 100
Potassium: 4.5 mmol/L (ref 3.5–5.1)
Sodium: 151 mmol/L — ABNORMAL HIGH (ref 135–145)
TCO2: 27 mmol/L (ref 22–32)
pCO2 arterial: 41.1 mmHg (ref 32–48)
pH, Arterial: 7.416 (ref 7.35–7.45)
pO2, Arterial: 161 mmHg — ABNORMAL HIGH (ref 83–108)

## 2021-08-04 LAB — GLUCOSE, CAPILLARY
Glucose-Capillary: 222 mg/dL — ABNORMAL HIGH (ref 70–99)
Glucose-Capillary: 233 mg/dL — ABNORMAL HIGH (ref 70–99)
Glucose-Capillary: 266 mg/dL — ABNORMAL HIGH (ref 70–99)
Glucose-Capillary: 270 mg/dL — ABNORMAL HIGH (ref 70–99)
Glucose-Capillary: 302 mg/dL — ABNORMAL HIGH (ref 70–99)
Glucose-Capillary: 339 mg/dL — ABNORMAL HIGH (ref 70–99)

## 2021-08-04 LAB — MAGNESIUM: Magnesium: 2.2 mg/dL (ref 1.7–2.4)

## 2021-08-04 MED ORDER — INSULIN ASPART 100 UNIT/ML IJ SOLN
4.0000 [IU] | INTRAMUSCULAR | Status: DC
  Administered 2021-08-04 – 2021-08-05 (×6): 4 [IU] via SUBCUTANEOUS

## 2021-08-04 MED ORDER — POTASSIUM CHLORIDE 20 MEQ PO PACK
20.0000 meq | PACK | ORAL | Status: AC
  Administered 2021-08-04 (×2): 20 meq
  Filled 2021-08-04 (×2): qty 1

## 2021-08-04 MED ORDER — ACETAMINOPHEN 160 MG/5ML PO SOLN
650.0000 mg | Freq: Four times a day (QID) | ORAL | Status: DC | PRN
  Administered 2021-08-04 (×2): 650 mg
  Filled 2021-08-04 (×2): qty 20.3

## 2021-08-04 MED ORDER — ACETAMINOPHEN 325 MG PO TABS: 650.0000 mg | ORAL_TABLET | Freq: Four times a day (QID) | ORAL | Status: DC | PRN

## 2021-08-04 MED ORDER — INSULIN DETEMIR 100 UNIT/ML ~~LOC~~ SOLN
12.0000 [IU] | Freq: Two times a day (BID) | SUBCUTANEOUS | Status: DC
  Administered 2021-08-04 (×2): 12 [IU] via SUBCUTANEOUS
  Filled 2021-08-04 (×4): qty 0.12

## 2021-08-04 MED ORDER — POTASSIUM CHLORIDE 10 MEQ/50ML IV SOLN
10.0000 meq | INTRAVENOUS | Status: AC
  Administered 2021-08-04 (×4): 10 meq via INTRAVENOUS
  Filled 2021-08-04 (×3): qty 50

## 2021-08-04 MED ORDER — INSULIN ASPART 100 UNIT/ML IJ SOLN
0.0000 [IU] | INTRAMUSCULAR | Status: DC
  Administered 2021-08-04: 5 [IU] via SUBCUTANEOUS
  Administered 2021-08-04: 11 [IU] via SUBCUTANEOUS
  Administered 2021-08-04: 8 [IU] via SUBCUTANEOUS
  Administered 2021-08-05 (×3): 3 [IU] via SUBCUTANEOUS

## 2021-08-04 MED ORDER — FREE WATER
250.0000 mL | Status: DC
  Administered 2021-08-04 – 2021-08-05 (×7): 250 mL

## 2021-08-04 NOTE — Progress Notes (Signed)
   08/04/21 1425  Clinical Encounter Type  Visited With Health care provider;Family (Patient's Daughter: Pearlie Oyster [phone])  Visit Type Critical Care;Follow-up  Referral From Nurse  Consult/Referral To Chaplain Gelene [facility])   Spoke with patient's daughter, Pearlie Oyster at [phone], to offer support during family meeting scheduled for Thursday, 7/13 @ 0900. [address] [place], Aletha Halim., [phone]

## 2021-08-04 NOTE — Progress Notes (Signed)
K+3.3 ?Replaced per protocol  ?

## 2021-08-04 NOTE — Progress Notes (Signed)
eLink Physician-Brief Progress Note Patient Name: Travis Cantrell DOB: 01/14/1961 MRN: [id number]   Date of Service  08/04/2021  HPI/Events of Note  Fever to 100.6 F - Already on Vancomycin and Meropenum. AST and ALT both elevated, therefore, will avoid Tylenol.   eICU Interventions  Plan: Ice packs PRN.  Cooling blanket PRN.     Intervention Category Major Interventions: Other:  Lenell Antu 08/04/2021, 12:56 AM
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
## 2021-08-04 NOTE — Progress Notes (Signed)
EEG complete - results pending 

## 2021-08-04 NOTE — Procedures (Signed)
Patient Name: Travis Cantrell  MRN: [id number]  Epilepsy Attending: Charlsie Quest  Referring Physician/Provider: Lidia Collum, PA-C  Date: 08/04/2021 Duration: 25.21 mins  Patient history: 61 year old male status post cardiac arrest.  EEG to evaluate for seizure.  Level of alertness:  lethargic   AEDs during EEG study: OXC  Technical aspects: This EEG study was done with scalp electrodes positioned according to the 10-20 International system of electrode placement. Electrical activity was acquired at a sampling rate of 500Hz  and reviewed with a high frequency filter of 70Hz  and a low frequency filter of 1Hz . EEG data were recorded continuously and digitally stored.   Description: EEG showed intermittent generalized sharply contoured 2.5 to 3.5 Hz delta slowing lasting 1 to 3 seconds alternating with low amplitude 1 to 2 seconds of 3 to 5 Hz theta-delta slowing. Hyperventilation and photic stimulation were not performed.     ABNORMALITY - Continuous slow, generalized  IMPRESSION: This study is suggestive of severe diffuse encephalopathy, nonspecific to etiology.  No seizures or definite epileptiform discharges were seen throughout the recording.  Mazell Aylesworth 

## 2021-08-04 NOTE — Progress Notes (Signed)
Patient transported from 2H14 to MRI and back with no complications noted.

## 2021-08-05 DIAGNOSIS — Z7189 Other specified counseling: Secondary | ICD-10-CM

## 2021-08-05 DIAGNOSIS — E43 Unspecified severe protein-calorie malnutrition: Secondary | ICD-10-CM | POA: Insufficient documentation

## 2021-08-05 DIAGNOSIS — I469 Cardiac arrest, cause unspecified: Secondary | ICD-10-CM | POA: Diagnosis not present

## 2021-08-05 LAB — BASIC METABOLIC PANEL
Anion gap: 4 — ABNORMAL LOW (ref 5–15)
BUN: 25 mg/dL — ABNORMAL HIGH (ref 8–23)
CO2: 29 mmol/L (ref 22–32)
Calcium: 8.7 mg/dL — ABNORMAL LOW (ref 8.9–10.3)
Chloride: 119 mmol/L — ABNORMAL HIGH (ref 98–111)
Creatinine, Ser: 0.8 mg/dL (ref 0.61–1.24)
GFR, Estimated: >60 mL/min (ref >60)
Glucose, Bld: 162 mg/dL — ABNORMAL HIGH (ref 70–99)
Potassium: 4.3 mmol/L (ref 3.5–5.1)
Sodium: 152 mmol/L — ABNORMAL HIGH (ref 135–145)

## 2021-08-05 LAB — URINE CULTURE: Culture: >=100000 — AB

## 2021-08-05 LAB — GLUCOSE, CAPILLARY
Glucose-Capillary: 156 mg/dL — ABNORMAL HIGH (ref 70–99)
Glucose-Capillary: 155 mg/dL — ABNORMAL HIGH (ref 70–99)
Glucose-Capillary: 173 mg/dL — ABNORMAL HIGH (ref 70–99)

## 2021-08-05 LAB — CBC
HCT: 37.7 % — ABNORMAL LOW (ref 39.0–52.0)
Hemoglobin: 11.7 g/dL — ABNORMAL LOW (ref 13.0–17.0)
MCH: 28 pg (ref 26.0–34.0)
MCHC: 31 g/dL (ref 30.0–36.0)
MCV: 90.2 fL (ref 80.0–100.0)
Platelets: 340 10*3/uL (ref 150–400)
RBC: 4.18 MIL/uL — ABNORMAL LOW (ref 4.22–5.81)
RDW: 16.6 % — ABNORMAL HIGH (ref 11.5–15.5)
WBC: 20.5 10*3/uL — ABNORMAL HIGH (ref 4.0–10.5)
nRBC: 0 % (ref 0.0–0.2)

## 2021-08-05 LAB — CULTURE, RESPIRATORY W GRAM STAIN

## 2021-08-05 LAB — MAGNESIUM: Magnesium: 2.1 mg/dL (ref 1.7–2.4)

## 2021-08-05 LAB — VITAMIN C: Vitamin C: 0.4 mg/dL (ref 0.4–2.0)

## 2021-08-05 MED ORDER — ACETAMINOPHEN 650 MG RE SUPP: 650.0000 mg | Freq: Four times a day (QID) | RECTAL | Status: DC | PRN

## 2021-08-05 MED ORDER — METOPROLOL TARTRATE 5 MG/5ML IV SOLN
INTRAVENOUS | Status: AC
  Filled 2021-08-05: qty 10

## 2021-08-05 MED ORDER — GLYCOPYRROLATE 0.2 MG/ML IJ SOLN
0.2000 mg | INTRAMUSCULAR | Status: DC | PRN
  Administered 2021-08-05: 0.2 mg via INTRAVENOUS
  Filled 2021-08-05: qty 1

## 2021-08-05 MED ORDER — POLYVINYL ALCOHOL 1.4 % OP SOLN: 1.0000 [drp] | Freq: Four times a day (QID) | OPHTHALMIC | Status: DC | PRN

## 2021-08-05 MED ORDER — GLYCOPYRROLATE 1 MG PO TABS: 1.0000 mg | ORAL_TABLET | ORAL | Status: DC | PRN

## 2021-08-05 MED ORDER — SODIUM CHLORIDE 0.9 % IV SOLN: INTRAVENOUS | Status: DC

## 2021-08-05 MED ORDER — MORPHINE 100MG IN NS 100ML (1MG/ML) PREMIX INFUSION
0.0000 mg/h | INTRAVENOUS | Status: DC
  Administered 2021-08-05: 5 mg/h via INTRAVENOUS
  Filled 2021-08-05: qty 100

## 2021-08-05 MED ORDER — ADENOSINE 6 MG/2ML IV SOLN
INTRAVENOUS | Status: AC
  Filled 2021-08-05: qty 2

## 2021-08-05 MED ORDER — ACETAMINOPHEN 325 MG PO TABS: 650.0000 mg | ORAL_TABLET | Freq: Four times a day (QID) | ORAL | Status: DC | PRN

## 2021-08-05 MED ORDER — MORPHINE BOLUS VIA INFUSION: 5.0000 mg | INTRAVENOUS | Status: DC | PRN

## 2021-08-05 MED ORDER — GLYCOPYRROLATE 0.2 MG/ML IJ SOLN: 0.2000 mg | INTRAMUSCULAR | Status: DC | PRN

## 2021-08-06 LAB — VITAMIN A: Vitamin A (Retinoic Acid): 20.6 ug/dL — ABNORMAL LOW (ref 22.0–69.5)

## 2021-08-06 LAB — ZINC: Zinc: 52 ug/dL (ref 44–115)

## 2021-08-07 LAB — CULTURE, BLOOD (ROUTINE X 2)
Culture: NO GROWTH
Special Requests: ADEQUATE

## 2021-08-08 LAB — CULTURE, BLOOD (ROUTINE X 2)
Culture: NO GROWTH
Special Requests: ADEQUATE

## 2021-08-24 NOTE — Progress Notes (Signed)
Nutrition Brief Note  Chart reviewed. Pt to transition to comfort care today/  No further nutrition interventions planned at this time.  Please re-consult as needed.   Romelle Starcher MS, RDN, LDN, CNSC Registered Dietitian 3 Clinical Nutrition RD Pager and On-Call Pager Number Located in [place]

## 2021-08-24 NOTE — Progress Notes (Signed)
All family at bedside with pt. Pt extubated at 1130 to room air.
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
## 2021-08-24 NOTE — Procedures (Signed)
Extubation Procedure Note  Patient Details:   Name: SHREYAS PIATKOWSKI DOB: 10/09/1960 MRN: [id number]   Airway Documentation:   Pt compassionately extubated per orders. Pt now comfort care. Vent end date: 08/08/2021 Vent end time: 1130   Evaluation  O2 sats: stable throughout Complications: No apparent complications Patient did tolerate procedure well. Bilateral Breath Sounds: Clear, Diminished   No  Suszanne Conners 07/25/2021, 11:36 AM

## 2021-08-24 NOTE — IPAL (Signed)
  Interdisciplinary Goals of Care Family Meeting   Date carried out: 08/08/2021  Location of the meeting: Conference room  Member's involved: Bedside Registered Nurse, Family Member or next of kin, and Other: PA-C  Durable Power of Insurance risk surveyor: Wife    Discussion: We discussed goals of care for Travis Cantrell .  Spoke with wife, daughter, step daughter and other family members. Discussed poor prognosis for patient. Updated them on EEG and MRI. Family decided to go withdrawal care today and go comfort. Orders placed.  Code status: Full DNR  Disposition: In-patient comfort care  Time spent for the meeting: 35 minutes    Lidia Collum, PA-C  08/10/2021, 9:29 AM

## 2021-08-24 NOTE — Discharge Summary (Signed)
DEATH SUMMARY   Patient Details  Name: Travis Cantrell MRN: [id number] DOB: 1960-04-30  Admission/Discharge Information   Admit Date:  07-Aug-2021  Date of Death: Date of Death: 2021-08-10  Time of Death: Time of Death: 1336/05/07  Length of Stay: 2  Referring Physician: Jinny Sanders, MD   Reason(s) for Hospitalization  Respiratory arrest  Diagnoses  Preliminary cause of death:   Aspiration pneumonia Secondary Diagnoses (including complications and co-morbidities):  Principal Problem:   Cardiac arrest [facility]) Active Problems:   Acute respiratory failure with hypoxia (HCC)   Aspiration pneumonia ([place])   Multiple sclerosis ([place])   Stress-induced cardiomyopathy   Protein-calorie malnutrition, severe   Goals of care, counseling/discussion   Brief Hospital Course (including significant findings, care, treatment, and services provided and events leading to death)  Travis Cantrell is a 61 y.o. year old male who had a longstanding progressive multiple sclerosis requiring institutionalization.  He was brought to the emergency department tachycardic febrile and hypotensive.  Chest x-ray showed bilateral interstitial airspace disease and it was felt that he had had an aspiration event. An echocardiogram showed a severe stress cardiomyopathy with an ejection fraction of 30%.  EEG showed generalized slowing but no seizures.  The patient has been failing health for some time and the family opted for transition to comfort care.  Pertinent Labs and Studies  Significant Diagnostic Studies MR BRAIN WO CONTRAST  Result Date: 2021/08/10 CLINICAL DATA:  Initial evaluation for altered mental status, history of multiple sclerosis, cardiac arrest. EXAM: MRI HEAD WITHOUT CONTRAST TECHNIQUE: Multiplanar, multiecho pulse sequences of the brain and surrounding structures were obtained without intravenous contrast. COMPARISON:  Comparison made with prior head CT from 08/07/2021 as well as prior brain MRI from  03/26/2018. FINDINGS: Brain: Diffuse prominence of the CSF containing spaces compatible with generalized cerebral atrophy, moderately advanced for age. Multiple scattered foci of T2/FLAIR hyperintensity seen involving the periventricular, deep, and juxta cortical white matter both cerebral hemispheres, consistent with history of multiple sclerosis. Patchy involvement of the brainstem and cerebellum present as well. Multiple corresponding T1 black holes noted. Overall, these changes are likely mildly progressed as compared to prior brain MRI from 05/08/2018. No convincing restricted diffusion seen about these lesions to suggest active demyelination. Diffuse diffusion and FLAIR signal abnormality seen involving the cortical gray matter of both cerebral hemispheres, most pronounced within the perirolandic and bilateral parieto-occipital regions. Involvement of the mesial temporal lobes and hippocampal formations bilaterally. Diffusely increased T2 and FLAIR signal intensity within the cerebellum present as well. Given the history of cardiac arrest, these changes likely reflect evolving changes of hypoxic ischemic injury. There are a few superimposed more confluent foci of diffusion signal abnormality within the right cerebellum, more suggestive of vascular infarcts (series 5, images 65, 69). These measure up to approximately 8 mm in size. No associated hemorrhage or mass effect. No other convincing vascular infarct. No foci of susceptibility artifact to suggest acute or chronic intracranial hemorrhage. No mass lesion or midline shift. No hydrocephalus or extra-axial fluid collection. Pituitary gland and suprasellar region are within normal limits. Vascular: Major intracranial vascular flow voids are maintained. Skull and upper cervical spine: Craniocervical junction within normal limits. Bone marrow signal intensity diffusely decreased on T1 weighted sequence, nonspecific, but most commonly related to anemia, smoking or  obesity. No focal marrow replacing lesion. No scalp soft tissue abnormality. Sinuses/Orbits: Globes and orbital soft tissues within normal limits. Paranasal sinuses are largely clear. Small right mastoid effusion noted. Patient appears  to be intubated. Other: None. IMPRESSION: 1. Diffusely increased diffusion and FLAIR signal abnormality involving the cortical gray matter of both cerebral hemispheres as well as the cerebellum. Given the patient's history of recent cardiac arrest, findings are favored to reflect sequelae of evolving hypoxic ischemic injury. 2. Few superimposed more confluent foci of diffusion signal abnormality within the right cerebellum, more suggestive of small acute to early subacute vascular infarcts. No associated hemorrhage or mass effect. 3. Underlying changes consistent with chronic demyelinating disease. Overall, appearance is mildly progressed as compared to 2020. 4. Moderately advanced cerebral atrophy for age. Electronically Signed   By: Rise Mu M.D.   On: 08/18/2021 04:51   EEG adult  Result Date: 08/04/2021 Charlsie Quest, MD     08/04/2021 12:30 PM Patient Name: Travis Cantrell MRN: [id number] Epilepsy Attending: Charlsie Quest Referring Physician/Provider: Lidia Collum, PA-C Date: 08/04/2021 Duration: 25.21 mins Patient history: 61 year old male status post cardiac arrest.  EEG to evaluate for seizure. Level of alertness:  lethargic AEDs during EEG study: [facility] Technical aspects: This EEG study was done with scalp electrodes positioned according to the 10-20 International system of electrode placement. Electrical activity was acquired at a sampling rate of 500Hz  and reviewed with a high frequency filter of 70Hz  and a low frequency filter of 1Hz . EEG data were recorded continuously and digitally stored. Description: EEG showed intermittent generalized sharply contoured 2.5 to 3.5 Hz delta slowing lasting 1 to 3 seconds alternating with low amplitude 1 to 2 seconds of 3  to 5 Hz theta-delta slowing. Hyperventilation and photic stimulation were not performed.   ABNORMALITY - Continuous slow, generalized IMPRESSION: This study is suggestive of severe diffuse encephalopathy, nonspecific to etiology.  No seizures or definite epileptiform discharges were seen throughout the recording.   DG Chest Port 1 View  Result Date: 08/04/2021 CLINICAL DATA:  Cardiac arrest EXAM: PORTABLE CHEST 1 VIEW COMPARISON:  Chest x-ray dated 08-21-21 FINDINGS: Stable position of ET tube and right IJ line. Enteric tube has been advanced slightly with side port near the GE junction. The heart size and mediastinal contours are within normal limits. Decreased central predominant airspace opacities. No large pleural effusion or evidence of pneumothorax. IMPRESSION: 1. Decreased central predominant airspace opacities, likely due to improved pulmonary edema. 2. Enteric tube has been advanced slightly with side port near the GE junction. Recommend further advancement for optimal positioning. Electronically Signed   By: Charlsie Quest M.D.   On: 08/04/2021 08:30   ECHOCARDIOGRAM COMPLETE  Result Date: 08/03/2021    ECHOCARDIOGRAM REPORT   Patient Name:   Travis Cantrell Date of Exam: 08/03/2021 Medical Rec #:  10/04/2021     Height:       70.0 in Accession #:    Randie Heinz    Weight:       164.7 lb Date of Birth:  January 30, 1960     BSA:          1.922 m Patient Age:    61 years      BP:           126/93 mmHg Patient Gender: M             HR:           102 bpm. Exam Location:  Inpatient Procedure: 2D Echo, Cardiac Doppler, Color Doppler and Intracardiac            Opacification Agent Indications:    Cardiac Arrest  History:        Patient has prior history of Echocardiogram examinations, most                 recent 01/13/2020. Arrythmias:Tachycardia. Sepsisi. Multiple                 sclerosis. Respiratory failure requiring mechanical ventilation.  Sonographer:    Merrie Roof RDCS Referring  Phys: [place]  1. There is no left ventricular thrombus (Definity contrast used). There is dyskinesis of the apical two-thirds of the left ventricle, with hyperdynamic contractility of the basal segments. 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 Left ventricular ejection fraction, by estimation, is 30 to 35%. The left ventricle has moderately decreased function. The left ventricle demonstrates regional wall motion abnormalities (see scoring diagram/findings for description). Left ventricular diastolic parameters are consistent with Grade I diastolic dysfunction (impaired relaxation).  2. Right ventricular systolic function is normal. The right ventricular size is normal. There is moderately elevated pulmonary artery systolic pressure. The estimated right ventricular systolic pressure is [id number] mmHg.  3. The mitral valve is normal in structure. No evidence of mitral valve regurgitation.  4. The aortic valve is tricuspid. Aortic valve regurgitation is not visualized.  5. The inferior vena cava is normal in size with greater than 50% respiratory variability, suggesting right atrial pressure of 3 mmHg. FINDINGS  Left Ventricle: There is no left ventricular thrombus (Definity contrast used). There is dyskinesis of the apical two-thirds of the left ventricle, with hyperdynamic contractility of the basal segments. 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 Left ventricular ejection fraction, by estimation, is 30 to 35%. The left ventricle has moderately decreased function. The left ventricle demonstrates regional wall motion  abnormalities. Definity contrast agent was given IV to delineate the left ventricular endocardial borders. The left ventricular internal cavity size was normal in size. There is no left ventricular hypertrophy. Left ventricular diastolic parameters are consistent with Grade I diastolic dysfunction (impaired relaxation). Normal left ventricular filling pressure. Right Ventricle: The right ventricular size is normal. No increase in right ventricular wall thickness. Right ventricular systolic function is normal. There is moderately elevated pulmonary artery systolic pressure. The tricuspid regurgitant velocity is 3.28 m/s, and with an assumed right atrial pressure of 3 mmHg, the estimated right ventricular systolic pressure is [id number] mmHg. Left Atrium: Left atrial size was normal in size. Right Atrium: Right atrial size was normal in size. Pericardium: Trivial pericardial effusion is present. The pericardial effusion is anterior to the right ventricle. Mitral Valve: The mitral valve is normal in structure. No evidence of mitral valve regurgitation. Tricuspid Valve: The tricuspid valve is normal in structure. Tricuspid valve regurgitation is mild. Aortic Valve: The aortic valve is tricuspid. Aortic valve regurgitation is not visualized. Aortic valve mean gradient measures 4.0 mmHg. Aortic valve peak gradient measures 7.2 mmHg. Aortic valve area, by VTI measures 3.62 cm. Pulmonic Valve: The pulmonic valve was grossly normal. Pulmonic valve regurgitation is not visualized. Aorta: The aortic root and ascending aorta are structurally normal, with no evidence of dilitation. Venous: The inferior vena cava is normal in size with greater than 50% respiratory variability, suggesting right atrial pressure of 3 mmHg. IAS/Shunts: No atrial level  shunt detected by color flow Doppler.  LEFT VENTRICLE PLAX 2D LVIDd:         5.20 cm      Diastology LVIDs:         3.40 cm      LV e' medial:    8.62 cm/s LV PW:         1.00 cm      LV  E/e' medial:  5.5 LV IVS:        0.80 cm      LV e' lateral:   7.88 cm/s LVOT diam:     2.20 cm      LV E/e' lateral: 6.0 LV SV:         76 LV SV Index:   40 LVOT Area:     3.80 cm  LV Volumes (MOD) LV vol d, MOD A2C: 109.0 ml LV vol d, MOD A4C: 175.0 ml LV vol s, MOD A2C: 67.2 ml LV vol s, MOD A4C: 106.0 ml LV SV MOD A2C:     41.8 ml LV SV MOD A4C:     175.0 ml LV SV MOD BP:      53.2 ml RIGHT VENTRICLE            IVC RV Basal diam:  3.70 cm    IVC diam: 1.50 cm RV S prime:     8.59 cm/s TAPSE (M-mode): 1.2 cm LEFT ATRIUM             Index        RIGHT ATRIUM           Index LA diam:        3.50 cm 1.82 cm/m   RA Area:     11.80 cm LA Vol (A2C):   43.0 ml 22.37 ml/m  RA Volume:   25.10 ml  13.06 ml/m LA Vol (A4C):   36.9 ml 19.20 ml/m LA Biplane Vol: 43.1 ml 22.43 ml/m  AORTIC VALVE AV Area (Vmax):    3.57 cm AV Area (Vmean):   3.63 cm AV Area (VTI):     3.62 cm AV Vmax:           134.00 cm/s AV Vmean:          90.900 cm/s AV VTI:            0.211 m AV Peak Grad:      7.2 mmHg AV Mean Grad:      4.0 mmHg LVOT Vmax:         126.00 cm/s LVOT Vmean:        86.900 cm/s LVOT VTI:          0.201 m LVOT/AV VTI ratio: 0.95  AORTA Ao Root diam: 3.40 cm Ao Asc diam:  3.10 cm MITRAL VALVE               TRICUSPID VALVE MV Area (PHT): 4.80 cm    TR Peak grad:   43.0 mmHg MV Decel Time: 158 msec    TR Vmax:        328.00 cm/s MV E velocity: 47.20 cm/s MV A velocity: 64.60 cm/s  SHUNTS MV E/A ratio:  0.73        Systemic VTI:  0.20 m                            Systemic Diam: 2.20 cm Dani Gobble Croitoru MD Electronically signed by Sanda Klein MD Signature  Date/Time: 08/03/2021/9:29:21 AM    Final    DG Chest Portable 1 View  Result Date: 08/22/2021 CLINICAL DATA:  Central line placement. EXAM: PORTABLE CHEST 1 VIEW COMPARISON:  Radiograph and CT earlier today FINDINGS: Right internal jugular central venous catheter tip overlies the lower SVC. Endotracheal tube tip 4.9 cm from the carina. Enteric tube tip below the  diaphragm in the stomach, side-port in the region of the distal esophagus. Stable heart size and mediastinal contours. Patchy bilateral perihilar predominant airspace disease, similar to earlier today. No significant pleural effusion or pneumothorax. IMPRESSION: 1. Right internal jugular central venous catheter tip projects over the lower SVC. 2. Endotracheal tube tip 4.9 cm from the carina. Enteric tube tip below the diaphragm in the stomach, side-port in the region of the distal esophagus. Recommend advancement of at least 5 cm. 3. Stable bilateral airspace disease. Electronically Signed   By: Keith Rake M.D.   On: 08/07/2021 23:58   CT Angio Chest PE W and/or Wo Contrast  Result Date: 08/04/2021 CLINICAL DATA:  Pulmonary embolism (PE) suspected, high prob. Post cardiac arrest. EXAM: CT ANGIOGRAPHY CHEST WITH CONTRAST TECHNIQUE: Multidetector CT imaging of the chest was performed using the standard protocol during bolus administration of intravenous contrast. Multiplanar CT image reconstructions and MIPs were obtained to evaluate the vascular anatomy. RADIATION DOSE REDUCTION: This exam was performed according to the departmental dose-optimization program which includes automated exposure control, adjustment of the mA and/or kV according to patient size and/or use of iterative reconstruction technique. CONTRAST:  29mL OMNIPAQUE IOHEXOL 350 MG/ML SOLN COMPARISON:  None Available. FINDINGS: Cardiovascular: No filling defects in the pulmonary arteries to suggest pulmonary emboli. Heart is normal size. Aorta is normal caliber. Scattered aortic calcifications. Mediastinum/Nodes: Endotracheal tube tip in the midtrachea. No mediastinal, hilar, or axillary adenopathy. Thyroid unremarkable. Lungs/Pleura: Extensive bilateral airspace disease, most confluent in the lower lobes but also seen in the upper lobes, right greater than left. No effusions. Upper Abdomen: Imaging into the upper abdomen demonstrates no acute  findings. NG tube tip is in the proximal to mid stomach. Musculoskeletal: Chest wall soft tissues are unremarkable. No acute bony abnormality. Review of the MIP images confirms the above findings. IMPRESSION: No evidence of pulmonary embolus. Extensive bilateral airspace disease concerning for pneumonia Aortic Atherosclerosis (ICD10-I70.0). Electronically Signed   By: Rolm Baptise M.D.   On: 08/03/2021 23:36   CT Head Wo Contrast  Result Date: 07/24/2021 CLINICAL DATA:  Mental status change, unknown cause post cardiac arrest EXAM: CT HEAD WITHOUT CONTRAST TECHNIQUE: Contiguous axial images were obtained from the base of the skull through the vertex without intravenous contrast. RADIATION DOSE REDUCTION: This exam was performed according to the departmental dose-optimization program which includes automated exposure control, adjustment of the mA and/or kV according to patient size and/or use of iterative reconstruction technique. COMPARISON:  03/22/2018 FINDINGS: Brain: Diffuse cerebral atrophy. No acute intracranial abnormality. Specifically, no hemorrhage, hydrocephalus, mass lesion, acute infarction, or significant intracranial injury. Vascular: No hyperdense vessel or unexpected calcification. Skull: No acute calvarial abnormality. Sinuses/Orbits: No acute findings Other: None IMPRESSION: Diffuse cerebral atrophy.  No acute intracranial abnormality. Electronically Signed   By: Rolm Baptise M.D.   On: 08/06/2021 23:34   DG Chest Port 1 View  Result Date: 08/04/2021 CLINICAL DATA:  Post intubation. EXAM: PORTABLE CHEST 1 VIEW COMPARISON:  Chest x-ray 07/25/2021 FINDINGS: Endotracheal tube tip is now 3.8 cm above the carina. Right-sided central venous catheter tip projects over the SVC. New enteric tube  extends below the diaphragm, tip not included on the image. Bilateral airspace disease, right greater than left, has not significantly changed. There is no pleural effusion or pneumothorax. The heart is  enlarged, similar to prior study. Osseous structures are stable. IMPRESSION: 1. Endotracheal tube tip is now 3.8 cm above the carina. 2. Stable bilateral airspace disease. Electronically Signed   By: Ronney Asters M.D.   On: 08/16/2021 22:37   DG Chest Port 1 View  Result Date: 08/19/2021 CLINICAL DATA:  Questionable sepsis EXAM: PORTABLE CHEST 1 VIEW COMPARISON:  03/27/2020 FINDINGS: Endotracheal tube is in the right mainstem bronchus. Recommend retracting 4-5 cm for optimal positioning. Heart is normal size. Bilateral perihilar airspace opacities, right greater than left. No effusions or acute bony abnormality. IMPRESSION: Right mainstem intubation.  Recommend retracting 4-5 cm. Bilateral perihilar airspace disease. These results were called by telephone at the time of interpretation on 08/18/2021 at 10:06 pm to provider [facility] , who verbally acknowledged these results. Electronically Signed   By: Rolm Baptise M.D.   On: 08/19/2021 22:08    Microbiology Recent Results (from the past 240 hour(s))  Blood Culture (routine x 2)     Status: None (Preliminary result)   Collection Time: 08/19/2021  9:35 PM   Specimen: BLOOD  Result Value Ref Range Status   Specimen Description BLOOD SITE NOT SPECIFIED  Final   Special Requests   Final    BOTTLES DRAWN AEROBIC AND ANAEROBIC Blood Culture adequate volume   Culture   Final    NO GROWTH 4 DAYS Performed at [facility], [address], [place], [place] [address]    Report Status PENDING  Incomplete  Resp Panel by RT-PCR (Flu A&B, Covid) Anterior Nasal Swab     Status: None   Collection Time: 08/03/21 12:48 AM   Specimen: Anterior Nasal Swab  Result Value Ref Range Status   SARS Coronavirus 2 by RT PCR NEGATIVE NEGATIVE Final    Comment: (NOTE) SARS-CoV-2 target nucleic acids are NOT DETECTED.  The SARS-CoV-2 RNA is generally detectable in upper respiratory specimens during the acute phase of infection. The lowest concentration of  SARS-CoV-2 viral copies this assay can detect is 138 copies/mL. A negative result does not preclude SARS-Cov-2 infection and should not be used as the sole basis for treatment or other patient management decisions. A negative result may occur with  improper specimen collection/handling, submission of specimen other than nasopharyngeal swab, presence of viral mutation(s) within the areas targeted by this assay, and inadequate number of viral copies(<138 copies/mL). A negative result must be combined with clinical observations, patient history, and epidemiological information. The expected result is Negative.  Fact Sheet for Patients:  EntrepreneurPulse.com.au  Fact Sheet for Healthcare Providers:  IncredibleEmployment.be  This test is no t yet approved or cleared by the [place] FDA and  has been authorized for detection and/or diagnosis of SARS-CoV-2 by FDA under an Emergency Use Authorization (EUA). This EUA will remain  in effect (meaning this test can be used) for the duration of the COVID-19 declaration under Section 564(b)(1) of the Act, 21 U.S.C.section 360bbb-3(b)(1), unless the authorization is terminated  or revoked sooner.       Influenza A by PCR NEGATIVE NEGATIVE Final   Influenza B by PCR NEGATIVE NEGATIVE Final    Comment: (NOTE) The Xpert Xpress SARS-CoV-2/FLU/RSV plus assay is intended as an aid in the diagnosis of influenza from Nasopharyngeal swab specimens and should not be used as a sole  basis for treatment. Nasal washings and aspirates are unacceptable for Xpert Xpress SARS-CoV-2/FLU/RSV testing.  Fact Sheet for Patients: EntrepreneurPulse.com.au  Fact Sheet for Healthcare Providers: IncredibleEmployment.be  This test is not yet approved or cleared by the [place] FDA and has been authorized for detection and/or diagnosis of SARS-CoV-2 by FDA under an Emergency Use  Authorization (EUA). This EUA will remain in effect (meaning this test can be used) for the duration of the COVID-19 declaration under Section 564(b)(1) of the Act, 21 U.S.C. section 360bbb-3(b)(1), unless the authorization is terminated or revoked.  Performed at [facility], [place] [address], [place], [place] [address]   Blood Culture (routine x 2)     Status: None (Preliminary result)   Collection Time: 08/03/21 12:48 AM   Specimen: BLOOD  Result Value Ref Range Status   Specimen Description BLOOD CENTRAL LINE  Final   Special Requests   Final    BOTTLES DRAWN AEROBIC AND ANAEROBIC Blood Culture adequate volume   Culture   Final    NO GROWTH 3 DAYS Performed at [facility] [address] [place], [place] [address]    Report Status PENDING  Incomplete  Culture, Respiratory w Gram Stain     Status: None   Collection Time: 08/03/21  2:03 AM   Specimen: Tracheal Aspirate; Respiratory  Result Value Ref Range Status   Specimen Description TRACHEAL ASPIRATE  Final   Special Requests NONE  Final   Gram Stain   Final    MODERATE WBC PRESENT,BOTH PMN AND MONONUCLEAR NO ORGANISMS SEEN Performed at [facility], [address] [place], [place] [address]    Culture FEW METHICILLIN RESISTANT STAPHYLOCOCCUS AUREUS  Final   Report Status Aug 08, 2021 FINAL  Final   Organism ID, Bacteria METHICILLIN RESISTANT STAPHYLOCOCCUS AUREUS  Final      Susceptibility   Methicillin resistant staphylococcus aureus - MIC*    CIPROFLOXACIN >=8 RESISTANT Resistant     ERYTHROMYCIN <=0.25 SENSITIVE Sensitive     GENTAMICIN <=0.5 SENSITIVE Sensitive     OXACILLIN >=4 RESISTANT Resistant     TETRACYCLINE <=1 SENSITIVE Sensitive     VANCOMYCIN 1 SENSITIVE Sensitive     TRIMETH/SULFA <=10 SENSITIVE Sensitive     CLINDAMYCIN <=0.25 SENSITIVE Sensitive     RIFAMPIN <=0.5 SENSITIVE Sensitive     Inducible Clindamycin NEGATIVE Sensitive     * FEW METHICILLIN RESISTANT STAPHYLOCOCCUS AUREUS   MRSA Next Gen by PCR, Nasal     Status: Abnormal   Collection Time: 08/03/21  2:03 AM   Specimen: Nasal Mucosa; Nasal Swab  Result Value Ref Range Status   MRSA by PCR Next Gen DETECTED (A) NOT DETECTED Final    Comment: RESULT CALLED TO, READ BACK BY AND VERIFIED WITH: R CARWATER,RN@[id number]  08/03/21 [place] (NOTE) The GeneXpert MRSA Assay (FDA approved for NASAL specimens only), is one component of a comprehensive MRSA colonization surveillance program. It is not intended to diagnose MRSA infection nor to guide or monitor treatment for MRSA infections. Test performance is not FDA approved in patients less than 59 years old. Performed at [facility], [place] [address] [place], [place] [address]   Urine Culture     Status: Abnormal   Collection Time: 08/03/21  2:54 AM   Specimen: In/Out Cath Urine  Result Value Ref Range Status   Specimen Description IN/OUT CATH URINE  Final   Special Requests   Final    NONE Performed at [facility],  [address] [place], [place] [address]    Culture (A)  Final    >=100,000 COLONIES/mL PSEUDOMONAS AERUGINOSA >=100,000 COLONIES/mL ENTEROCOCCUS FAECALIS    Report Status 2021/08/18 FINAL  Final   Organism ID, Bacteria PSEUDOMONAS AERUGINOSA (A)  Final   Organism ID, Bacteria ENTEROCOCCUS FAECALIS (A)  Final      Susceptibility   Enterococcus faecalis - MIC*    AMPICILLIN <=2 SENSITIVE Sensitive     NITROFURANTOIN <=16 SENSITIVE Sensitive     VANCOMYCIN 1 SENSITIVE Sensitive     * >=100,000 COLONIES/mL ENTEROCOCCUS FAECALIS   Pseudomonas aeruginosa - MIC*    CEFTAZIDIME 4 SENSITIVE Sensitive     CIPROFLOXACIN <=0.25 SENSITIVE Sensitive     GENTAMICIN <=1 SENSITIVE Sensitive     IMIPENEM 2 SENSITIVE Sensitive     PIP/TAZO 8 SENSITIVE Sensitive     CEFEPIME 2 SENSITIVE Sensitive     * >=100,000 COLONIES/mL PSEUDOMONAS AERUGINOSA    Lab Basic Metabolic Panel: Recent Labs  Lab 08/03/21 0429 08/03/21 2000 08/04/21 0500  08/04/21 1312 08/04/21 1314 August 18, 2021 0414  NA 148* 149* 150* 151* 149* 152*  K 3.9 3.1* 3.3* 4.5 4.6 4.3  CL 118* 114* 117*  --  115* 119*  CO2 18* 25 27  --  26 29  GLUCOSE 206* 368* 299*  --  328* 162*  BUN 23 28* 28*  --  28* 25*  CREATININE 1.26* 1.10 0.94  --  0.86 0.80  CALCIUM 7.8* 8.2* 8.3*  --  8.3* 8.7*  MG 1.4*  --  2.2  --   --  2.1  PHOS 3.2  --   --   --   --   --    Liver Function Tests: Recent Labs  Lab 08/09/2021 2102 08/03/21 0429  AST 78* 63*  ALT 50* 57*  ALKPHOS 116 75  BILITOT 0.4 0.6  PROT 5.5* 5.5*  ALBUMIN 2.0* 1.8*   No results for input(s): "LIPASE", "AMYLASE" in the last 168 hours. No results for input(s): "AMMONIA" in the last 168 hours. CBC: Recent Labs  Lab 08/01/2021 2102 08/15/2021 2203 07/28/2021 2326 08/03/21 0429 08/04/21 0500 08/04/21 1312 August 18, 2021 0414  WBC 31.9*  --   --  35.6* 22.4*  --  20.5*  NEUTROABS 23.6*  --   --   --   --   --   --   HGB 12.5*   < > 9.9* 12.0* 12.0* 11.6* 11.7*  HCT 42.6   < > 29.0* 39.6 38.4* 34.0* 37.7*  MCV 95.1  --   --  91.9 89.7  --  90.2  PLT 553*  --   --  390 367  --  340   < > = values in this interval not displayed.   Cardiac Enzymes: No results for input(s): "CKTOTAL", "CKMB", "CKMBINDEX", "TROPONINI" in the last 168 hours. Sepsis Labs: Recent Labs  Lab 07/31/2021 2102 08/03/21 0048 08/03/21 0254 08/03/21 0429 08/04/21 0500 08-18-2021 0414  WBC 31.9*  --   --  35.6* 22.4* 20.5*  LATICACIDVEN  --  2.2* 1.9  --   --   --     Procedures/Operations  Mechanical ventilation   Nelle Sayed 08/06/2021, 1:14 PM

## 2021-08-24 DEATH — deceased

## 2022-05-09 ENCOUNTER — Ambulatory Visit: Payer: Medicare (Managed Care) | Admitting: Neurology

## 2022-10-11 IMAGING — CT CT ABD-PELV W/O CM
2 of 4 series · 16 of 46 positions shown, 18 images · non-contrast
Comparison: Renal ultrasound 01/12/2020, abdominal radiographs
02/12/2015 and CT 03/03/2004.

CLINICAL DATA: Bladder neck obstruction. Evaluate for ureteral
calculus.

EXAM:
CT ABDOMEN AND PELVIS WITHOUT CONTRAST
TECHNIQUE: Multidetector CT imaging of the abdomen and pelvis was performed
following the standard protocol without IV contrast.

[Series 3: ap without · axial · non-contrast · 0.87mm/px · z∈[+920,+1385]mm · 13 of 105 slices shown, 15 images]
[im 6/105  soft-tissue]
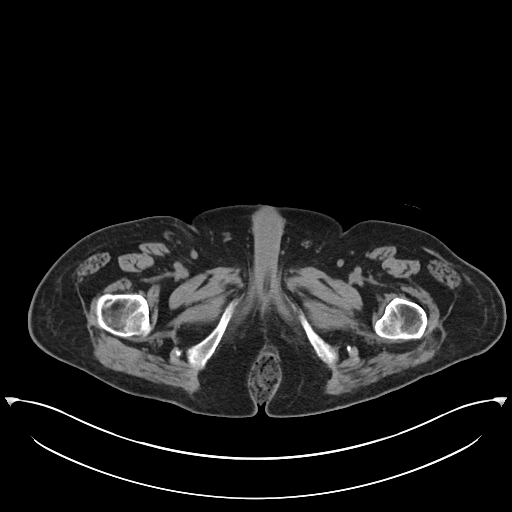
[im 6/105  bone]
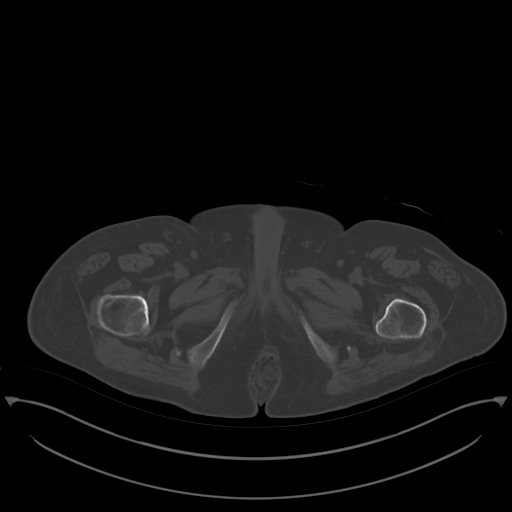
[im 16/105  soft-tissue]
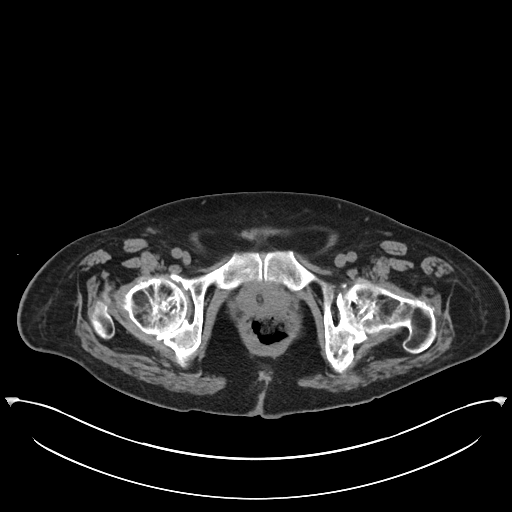
[im 21/105  soft-tissue]
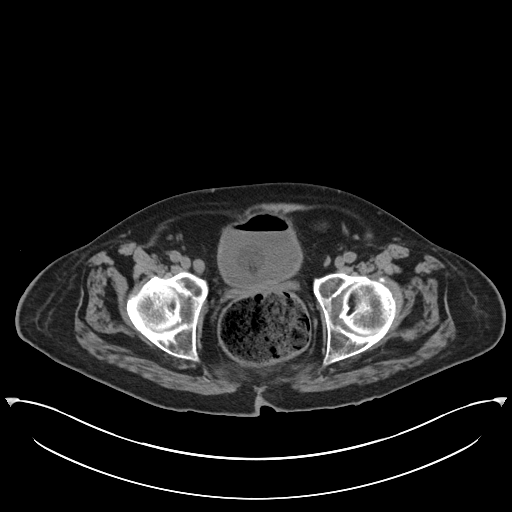
[im 32/105  soft-tissue]
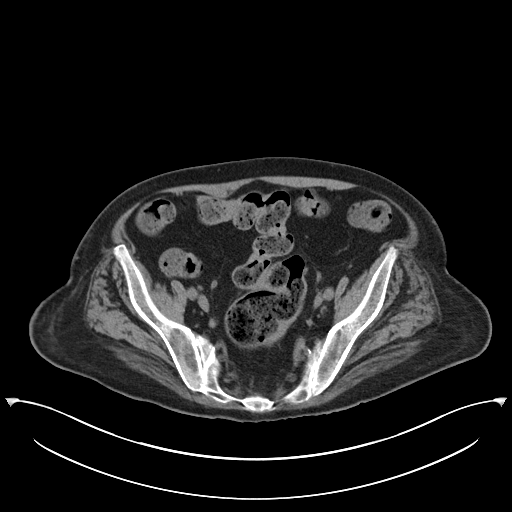
[im 37/105  soft-tissue]
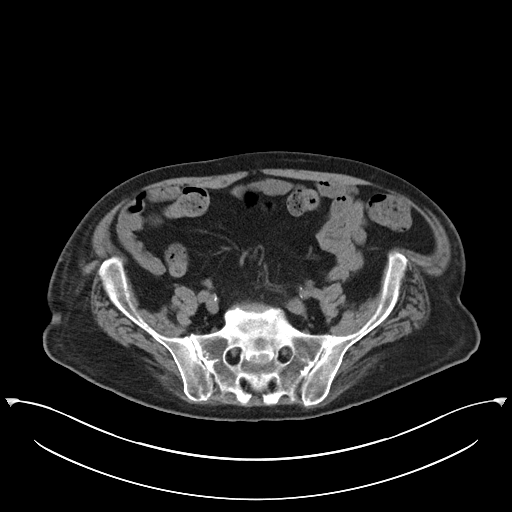
[im 47/105  soft-tissue]
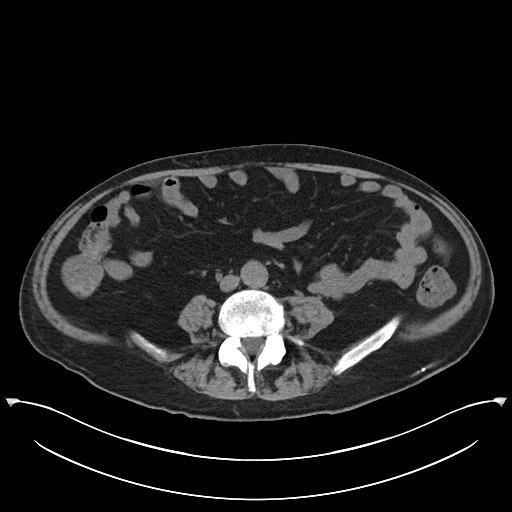
[im 53/105  soft-tissue]
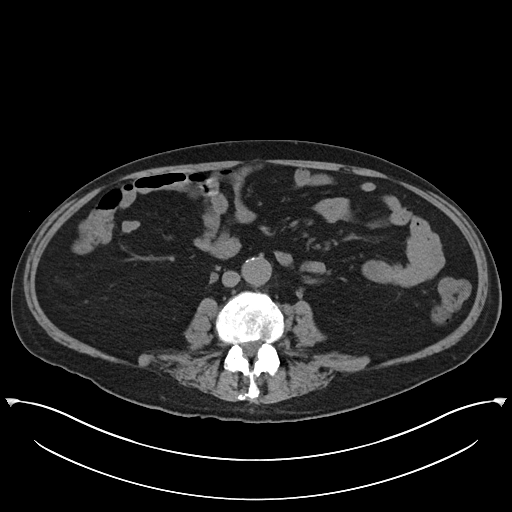
[im 58/105  soft-tissue]
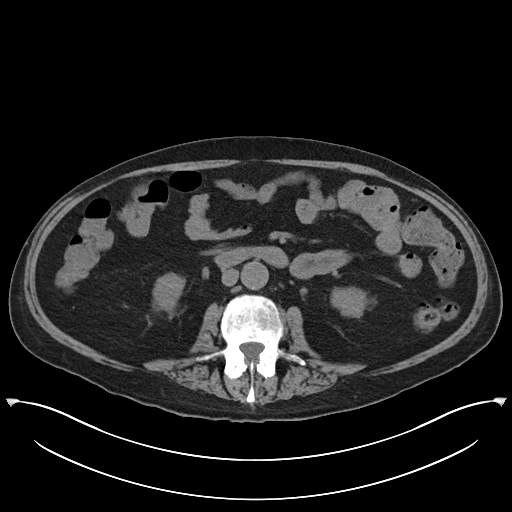
[im 68/105  soft-tissue]
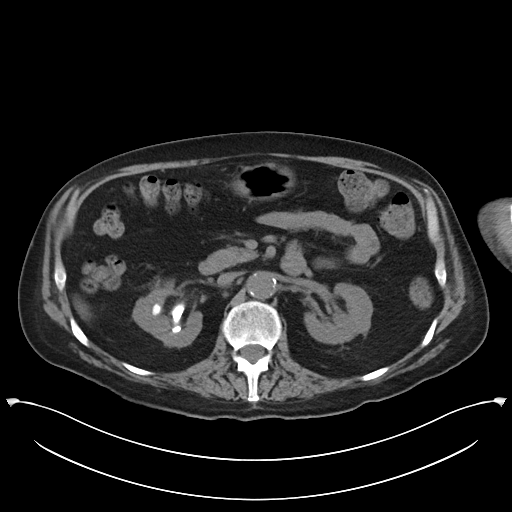
[im 68/105  bone]
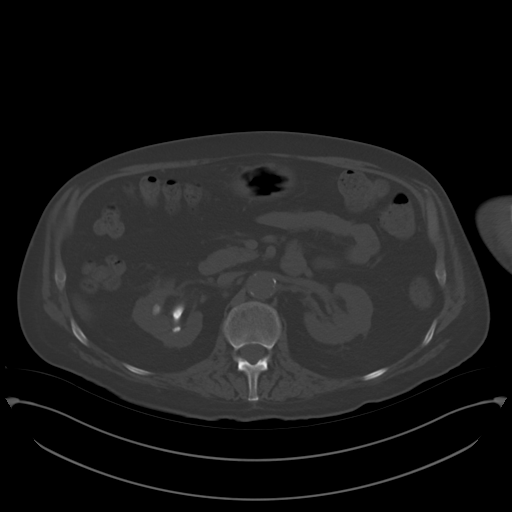
[im 73/105  soft-tissue]
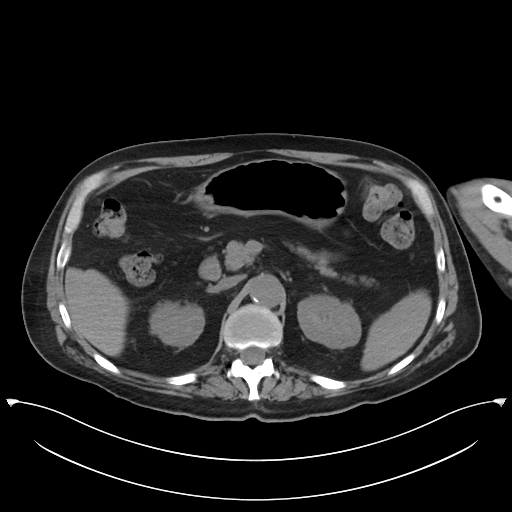
[im 84/105  soft-tissue]
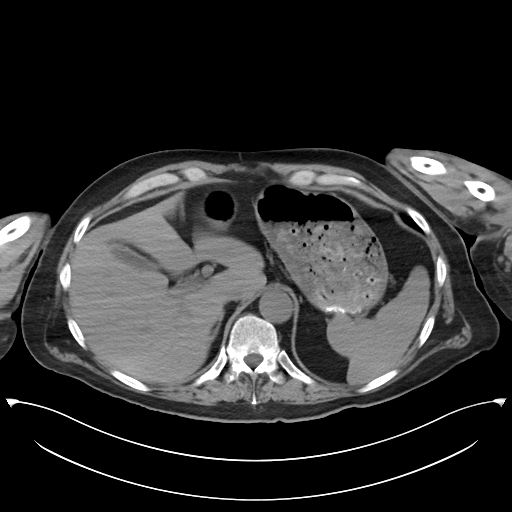
[im 89/105  soft-tissue]
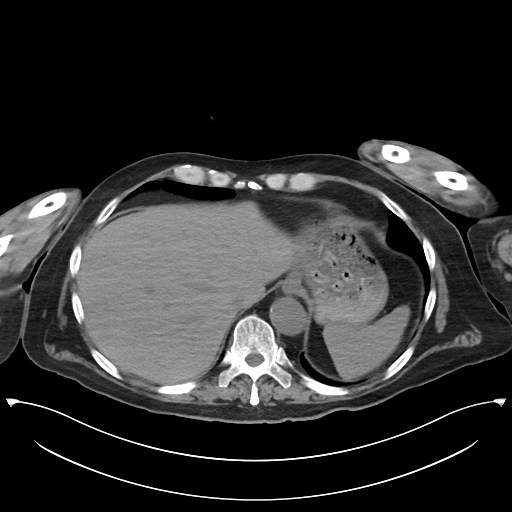
[im 99/105  soft-tissue]
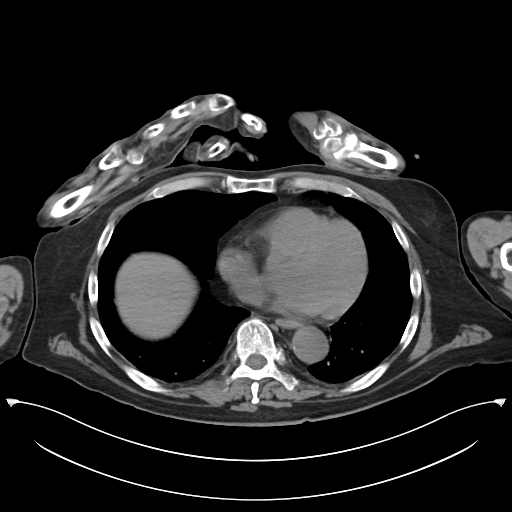

[Series 6: cor · coronal · 0.98mm/px · 3 of 99 slices shown]
[im 33/99  soft-tissue]
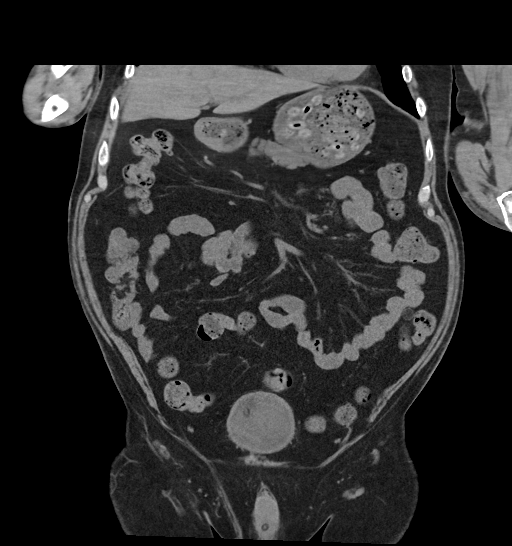
[im 44/99  soft-tissue]
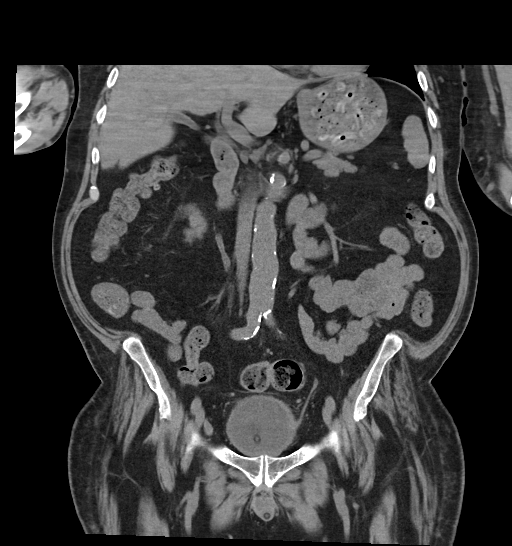
[im 55/99  soft-tissue]
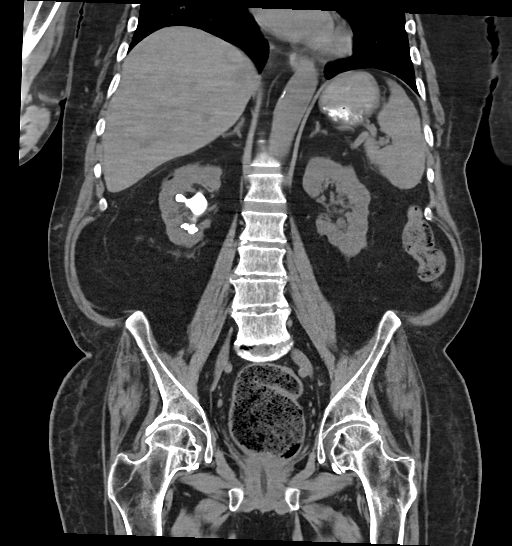

[16 of 46 positions shown; findings below may reference images not displayed]

FINDINGS: Lower chest: There are ill-defined nodular densities within the
lingula and left lower lobe as well as more diffuse patchy
ground-glass opacities in both lower lobes, likely
infectious/inflammatory. No significant pleural or pericardial
effusion. There is mild aortic atherosclerosis.

Hepatobiliary: No focal hepatic abnormalities on noncontrast
imaging. The gallbladder is incompletely distended. No evidence of
gallstones, gallbladder wall thickening or biliary dilatation.

Pancreas: Unremarkable. No pancreatic ductal dilatation or
surrounding inflammatory changes.

Spleen: Normal in size without focal abnormality.

Adrenals/Urinary Tract: Both adrenal glands appear normal. There is
a large right renal staghorn calculus which nearly fully occupies
the right renal collecting system, extending 7.0 cm in length on
sagittal image 59/7. There are small nonobstructing calculi in the
lower pole of the left kidney, measuring up to 6 mm in diameter. No
significant ureteral dilatation, periureteral inflammation or
ureteral calculus. Both kidneys demonstrate underlying cortical
scarring. A Foley catheter is in place, accounting for gas in the
bladder lumen. No evidence of bladder wall thickening or bladder
calculus.

Stomach/Bowel: No evidence of bowel wall thickening, distention or
surrounding inflammatory change. The appendix appears normal. There
is prominent stool in the rectum.

Vascular/Lymphatic: There are no enlarged abdominal or pelvic lymph
nodes. There are scattered small retroperitoneal lymph nodes. Mild
aortic and branch vessel atherosclerosis.

Reproductive: The prostate gland and seminal vesicles appear normal.

Other: No evidence of abdominal wall mass or hernia. No ascites.

Musculoskeletal: No acute or significant osseous findings. Mild
degenerative changes in the lower lumbar spine associated with a
minimal convex right scoliosis. Mild degenerative changes at the
hips.
IMPRESSION: 1. Large right renal staghorn calculus nearly fully occupies the
right renal collecting system. No evidence of ureteral calculus or
hydronephrosis.
2. Small nonobstructing calculi in the lower pole of the left
kidney.
3. Bilateral renal cortical scarring.
4. Ill-defined nodular pulmonary densities in the lingula and left
lower lobe as well as more diffuse patchy ground-glass opacities in
both lower lobes, likely infectious/inflammatory. Recommend
radiographic follow-up.
5. Aortic Atherosclerosis ([id number]).
